# Patient Record
Sex: Female | Born: 1960 | Race: White | Hispanic: No | Marital: Married | Smoking: Current every day smoker
Health system: Southern US, Community
[De-identification: ages and names within clinical notes are randomized; demographics above are authoritative.]

## PROBLEM LIST (undated history)

## (undated) ENCOUNTER — Emergency Department (HOSPITAL_COMMUNITY): Admission: EM | Payer: Medicare Other | Source: Home / Self Care

## (undated) DIAGNOSIS — K449 Diaphragmatic hernia without obstruction or gangrene: Secondary | ICD-10-CM

## (undated) DIAGNOSIS — M5430 Sciatica, unspecified side: Secondary | ICD-10-CM

## (undated) DIAGNOSIS — M1990 Unspecified osteoarthritis, unspecified site: Secondary | ICD-10-CM

## (undated) DIAGNOSIS — K589 Irritable bowel syndrome without diarrhea: Secondary | ICD-10-CM

## (undated) DIAGNOSIS — Q282 Arteriovenous malformation of cerebral vessels: Secondary | ICD-10-CM

## (undated) DIAGNOSIS — G43909 Migraine, unspecified, not intractable, without status migrainosus: Secondary | ICD-10-CM

## (undated) DIAGNOSIS — Z9889 Other specified postprocedural states: Secondary | ICD-10-CM

## (undated) DIAGNOSIS — I10 Essential (primary) hypertension: Secondary | ICD-10-CM

## (undated) DIAGNOSIS — S0430XA Injury of trigeminal nerve, unspecified side, initial encounter: Secondary | ICD-10-CM

## (undated) DIAGNOSIS — F419 Anxiety disorder, unspecified: Secondary | ICD-10-CM

## (undated) DIAGNOSIS — M797 Fibromyalgia: Secondary | ICD-10-CM

## (undated) DIAGNOSIS — E785 Hyperlipidemia, unspecified: Secondary | ICD-10-CM

## (undated) DIAGNOSIS — K219 Gastro-esophageal reflux disease without esophagitis: Secondary | ICD-10-CM

## (undated) DIAGNOSIS — D3500 Benign neoplasm of unspecified adrenal gland: Secondary | ICD-10-CM

## (undated) HISTORY — PX: LUNG SURGERY: SHX703

## (undated) HISTORY — PX: APPENDECTOMY: SHX54

## (undated) HISTORY — PX: CEREBRAL ANEURYSM REPAIR: SHX164

## (undated) HISTORY — PX: ABDOMINAL HYSTERECTOMY: SHX81

## (undated) HISTORY — PX: BRAIN SURGERY: SHX531

## (undated) HISTORY — PX: OTHER SURGICAL HISTORY: SHX169

## (undated) HISTORY — PX: CERVICAL FUSION: SHX112

## (undated) HISTORY — PX: ABDOMINAL SURGERY: SHX537

## (undated) HISTORY — PX: CHOLECYSTECTOMY: SHX55

---

## 2007-12-09 ENCOUNTER — Emergency Department (HOSPITAL_COMMUNITY): Admission: EM | Admit: 2007-12-09 | Discharge: 2007-12-09 | Payer: Self-pay | Admitting: Emergency Medicine

## 2009-11-01 DIAGNOSIS — K219 Gastro-esophageal reflux disease without esophagitis: Secondary | ICD-10-CM | POA: Insufficient documentation

## 2009-12-06 HISTORY — PX: LAPAROSCOPIC NISSEN FUNDOPLICATION: SHX1932

## 2009-12-06 NOTE — Procedures (Signed)
"   Brief Operative Note  12/06/2009   11:56 AM  Pre-Op Diagnosis: GERD  Post-Op Diagnosis: GERD  Procedures:  1. Laparoscopic Nissen fundoplication  Primary Surgeon: Alm Molt, MD  Fellow Surgeon: Camie Maiden, MD  Assistants: Lynwood Grade, MD  Findings: 1. Normal EGD   Anesthesia: General  IV Fluid: 1200cc  EBL: 50cc  UOP: 100cc  Specimens: None  Drains: None  Complications: None  Disposition: [facility]PO ICU  Condition: Stable  Lynwood Grade, M.D.   "

## 2010-05-24 ENCOUNTER — Encounter (HOSPITAL_COMMUNITY): Payer: Self-pay | Admitting: Radiology

## 2010-05-24 ENCOUNTER — Emergency Department (HOSPITAL_COMMUNITY)
Admission: EM | Admit: 2010-05-24 | Discharge: 2010-05-24 | Disposition: A | Discharge status: Home / Self Care | Payer: Medicare Other | Attending: Emergency Medicine | Admitting: Emergency Medicine

## 2010-05-24 ENCOUNTER — Emergency Department (HOSPITAL_COMMUNITY): Payer: Medicare Other

## 2010-05-24 DIAGNOSIS — K589 Irritable bowel syndrome without diarrhea: Secondary | ICD-10-CM | POA: Insufficient documentation

## 2010-05-24 DIAGNOSIS — R197 Diarrhea, unspecified: Secondary | ICD-10-CM | POA: Insufficient documentation

## 2010-05-24 DIAGNOSIS — R109 Unspecified abdominal pain: Secondary | ICD-10-CM | POA: Insufficient documentation

## 2010-05-24 DIAGNOSIS — Z79899 Other long term (current) drug therapy: Secondary | ICD-10-CM | POA: Insufficient documentation

## 2010-05-24 DIAGNOSIS — I10 Essential (primary) hypertension: Secondary | ICD-10-CM | POA: Insufficient documentation

## 2010-05-24 DIAGNOSIS — Z888 Allergy status to other drugs, medicaments and biological substances status: Secondary | ICD-10-CM | POA: Insufficient documentation

## 2010-05-24 DIAGNOSIS — IMO0001 Reserved for inherently not codable concepts without codable children: Secondary | ICD-10-CM | POA: Insufficient documentation

## 2010-05-24 DIAGNOSIS — K449 Diaphragmatic hernia without obstruction or gangrene: Secondary | ICD-10-CM | POA: Insufficient documentation

## 2010-05-24 DIAGNOSIS — R112 Nausea with vomiting, unspecified: Secondary | ICD-10-CM | POA: Insufficient documentation

## 2010-05-24 DIAGNOSIS — F3289 Other specified depressive episodes: Secondary | ICD-10-CM | POA: Insufficient documentation

## 2010-05-24 DIAGNOSIS — F329 Major depressive disorder, single episode, unspecified: Secondary | ICD-10-CM | POA: Insufficient documentation

## 2010-05-24 DIAGNOSIS — M5430 Sciatica, unspecified side: Secondary | ICD-10-CM | POA: Insufficient documentation

## 2010-05-24 DIAGNOSIS — E7800 Pure hypercholesterolemia, unspecified: Secondary | ICD-10-CM | POA: Insufficient documentation

## 2010-05-24 HISTORY — DX: Essential (primary) hypertension: I10

## 2010-05-24 LAB — URINALYSIS, ROUTINE W REFLEX MICROSCOPIC
Bilirubin Urine: NEGATIVE
Glucose, UA: NEGATIVE mg/dL
Hgb urine dipstick: NEGATIVE
Ketones, ur: NEGATIVE mg/dL
Nitrite: NEGATIVE
Protein, ur: NEGATIVE mg/dL
Specific Gravity, Urine: 1.02 (ref 1.005–1.030)
Urobilinogen, UA: 0.2 mg/dL (ref 0.0–1.0)
pH: 6 (ref 5.0–8.0)

## 2010-05-24 LAB — DIFFERENTIAL
Basophils Absolute: 0.1 10*3/uL (ref 0.0–0.1)
Basophils Relative: 1 % (ref 0–1)
Eosinophils Absolute: 0.1 10*3/uL (ref 0.0–0.7)
Eosinophils Relative: 1 % (ref 0–5)
Lymphocytes Relative: 40 % (ref 12–46)
Lymphs Abs: 4.1 10*3/uL — ABNORMAL HIGH (ref 0.7–4.0)
Monocytes Absolute: 0.5 10*3/uL (ref 0.1–1.0)
Monocytes Relative: 5 % (ref 3–12)
Neutro Abs: 5.4 10*3/uL (ref 1.7–7.7)
Neutrophils Relative %: 53 % (ref 43–77)

## 2010-05-24 LAB — COMPREHENSIVE METABOLIC PANEL
ALT: 30 U/L (ref 0–35)
AST: 19 U/L (ref 0–37)
Albumin: 3.5 g/dL (ref 3.5–5.2)
Alkaline Phosphatase: 48 U/L (ref 39–117)
BUN: 12 mg/dL (ref 6–23)
CO2: 27 mEq/L (ref 19–32)
Calcium: 9 mg/dL (ref 8.4–10.5)
Chloride: 105 mEq/L (ref 96–112)
Creatinine, Ser: 0.79 mg/dL (ref 0.4–1.2)
GFR calc Af Amer: >60 mL/min (ref >60)
GFR calc non Af Amer: >60 mL/min (ref >60)
Glucose, Bld: 92 mg/dL (ref 70–99)
Potassium: 3.2 mEq/L — ABNORMAL LOW (ref 3.5–5.1)
Sodium: 138 mEq/L (ref 135–145)
Total Bilirubin: 0.6 mg/dL (ref 0.3–1.2)
Total Protein: 6.7 g/dL (ref 6.0–8.3)

## 2010-05-24 LAB — CBC
HCT: 39.9 % (ref 36.0–46.0)
Hemoglobin: 12.9 g/dL (ref 12.0–15.0)
MCH: 30.1 pg (ref 26.0–34.0)
MCHC: 32.3 g/dL (ref 30.0–36.0)
MCV: 93.2 fL (ref 78.0–100.0)
Platelets: 244 10*3/uL (ref 150–400)
RBC: 4.28 MIL/uL (ref 3.87–5.11)
RDW: 13.4 % (ref 11.5–15.5)
WBC: 10.3 10*3/uL (ref 4.0–10.5)

## 2010-05-24 LAB — LIPASE, BLOOD: Lipase: 20 U/L (ref 11–59)

## 2010-05-24 MED ORDER — IOHEXOL 300 MG/ML  SOLN
100.0000 mL | Freq: Once | INTRAMUSCULAR | Status: AC | PRN
  Administered 2010-05-24: 100 mL via INTRAVENOUS

## 2010-07-16 ENCOUNTER — Emergency Department (HOSPITAL_COMMUNITY)
Admission: EM | Admit: 2010-07-16 | Discharge: 2010-07-16 | Disposition: A | Discharge status: Home / Self Care | Payer: Medicare Other | Attending: Emergency Medicine | Admitting: Emergency Medicine

## 2010-07-16 DIAGNOSIS — R079 Chest pain, unspecified: Secondary | ICD-10-CM | POA: Insufficient documentation

## 2010-07-16 DIAGNOSIS — F3289 Other specified depressive episodes: Secondary | ICD-10-CM | POA: Insufficient documentation

## 2010-07-16 DIAGNOSIS — E7800 Pure hypercholesterolemia, unspecified: Secondary | ICD-10-CM | POA: Insufficient documentation

## 2010-07-16 DIAGNOSIS — I10 Essential (primary) hypertension: Secondary | ICD-10-CM | POA: Insufficient documentation

## 2010-07-16 DIAGNOSIS — G8929 Other chronic pain: Secondary | ICD-10-CM | POA: Insufficient documentation

## 2010-07-16 DIAGNOSIS — F329 Major depressive disorder, single episode, unspecified: Secondary | ICD-10-CM | POA: Insufficient documentation

## 2010-07-16 DIAGNOSIS — R110 Nausea: Secondary | ICD-10-CM | POA: Insufficient documentation

## 2010-07-16 LAB — CK: Total CK: 25 U/L (ref 7–177)

## 2010-08-01 ENCOUNTER — Emergency Department (HOSPITAL_COMMUNITY): Payer: Medicare Other

## 2010-08-01 ENCOUNTER — Emergency Department (HOSPITAL_COMMUNITY)
Admission: EM | Admit: 2010-08-01 | Discharge: 2010-08-01 | Disposition: A | Discharge status: Home / Self Care | Payer: Medicare Other | Attending: Emergency Medicine | Admitting: Emergency Medicine

## 2010-08-01 DIAGNOSIS — K449 Diaphragmatic hernia without obstruction or gangrene: Secondary | ICD-10-CM | POA: Insufficient documentation

## 2010-08-01 DIAGNOSIS — M549 Dorsalgia, unspecified: Secondary | ICD-10-CM | POA: Insufficient documentation

## 2010-08-01 DIAGNOSIS — R079 Chest pain, unspecified: Secondary | ICD-10-CM | POA: Insufficient documentation

## 2010-08-01 DIAGNOSIS — IMO0001 Reserved for inherently not codable concepts without codable children: Secondary | ICD-10-CM | POA: Insufficient documentation

## 2010-08-01 DIAGNOSIS — F329 Major depressive disorder, single episode, unspecified: Secondary | ICD-10-CM | POA: Insufficient documentation

## 2010-08-01 DIAGNOSIS — E876 Hypokalemia: Secondary | ICD-10-CM | POA: Insufficient documentation

## 2010-08-01 DIAGNOSIS — K589 Irritable bowel syndrome without diarrhea: Secondary | ICD-10-CM | POA: Insufficient documentation

## 2010-08-01 DIAGNOSIS — F3289 Other specified depressive episodes: Secondary | ICD-10-CM | POA: Insufficient documentation

## 2010-08-01 DIAGNOSIS — E7800 Pure hypercholesterolemia, unspecified: Secondary | ICD-10-CM | POA: Insufficient documentation

## 2010-08-01 DIAGNOSIS — R109 Unspecified abdominal pain: Secondary | ICD-10-CM | POA: Insufficient documentation

## 2010-08-01 DIAGNOSIS — R52 Pain, unspecified: Secondary | ICD-10-CM | POA: Insufficient documentation

## 2010-08-01 DIAGNOSIS — I10 Essential (primary) hypertension: Secondary | ICD-10-CM | POA: Insufficient documentation

## 2010-08-01 DIAGNOSIS — Z79899 Other long term (current) drug therapy: Secondary | ICD-10-CM | POA: Insufficient documentation

## 2010-08-01 LAB — URINALYSIS, ROUTINE W REFLEX MICROSCOPIC
Bilirubin Urine: NEGATIVE
Glucose, UA: NEGATIVE mg/dL
Hgb urine dipstick: NEGATIVE
Ketones, ur: NEGATIVE mg/dL
Leukocytes, UA: NEGATIVE
Nitrite: NEGATIVE
Protein, ur: NEGATIVE mg/dL
Specific Gravity, Urine: 1.015 (ref 1.005–1.030)
Urobilinogen, UA: 0.2 mg/dL (ref 0.0–1.0)
pH: 5.5 (ref 5.0–8.0)

## 2010-08-01 LAB — DIFFERENTIAL
Basophils Absolute: 0.1 10*3/uL (ref 0.0–0.1)
Basophils Relative: 1 % (ref 0–1)
Eosinophils Absolute: 0.2 10*3/uL (ref 0.0–0.7)
Eosinophils Relative: 2 % (ref 0–5)
Lymphocytes Relative: 38 % (ref 12–46)
Lymphs Abs: 3.3 10*3/uL (ref 0.7–4.0)
Monocytes Absolute: 0.6 10*3/uL (ref 0.1–1.0)
Monocytes Relative: 7 % (ref 3–12)
Neutro Abs: 4.5 10*3/uL (ref 1.7–7.7)
Neutrophils Relative %: 53 % (ref 43–77)

## 2010-08-01 LAB — BASIC METABOLIC PANEL
BUN: 14 mg/dL (ref 6–23)
CO2: 23 mEq/L (ref 19–32)
Calcium: 10.3 mg/dL (ref 8.4–10.5)
Chloride: 104 mEq/L (ref 96–112)
Creatinine, Ser: 0.85 mg/dL (ref 0.4–1.2)
GFR calc Af Amer: >60 mL/min (ref >60)
GFR calc non Af Amer: >60 mL/min (ref >60)
Glucose, Bld: 101 mg/dL — ABNORMAL HIGH (ref 70–99)
Potassium: 2.8 mEq/L — ABNORMAL LOW (ref 3.5–5.1)
Sodium: 139 mEq/L (ref 135–145)

## 2010-08-01 LAB — CBC
HCT: 44.4 % (ref 36.0–46.0)
Hemoglobin: 15 g/dL (ref 12.0–15.0)
MCH: 30.5 pg (ref 26.0–34.0)
MCHC: 33.8 g/dL (ref 30.0–36.0)
MCV: 90.2 fL (ref 78.0–100.0)
Platelets: 298 10*3/uL (ref 150–400)
RBC: 4.92 MIL/uL (ref 3.87–5.11)
RDW: 13.4 % (ref 11.5–15.5)
WBC: 8.6 10*3/uL (ref 4.0–10.5)

## 2010-08-01 LAB — CK TOTAL AND CKMB (NOT AT ARMC)
CK, MB: 1.5 ng/mL (ref 0.3–4.0)
Relative Index: INVALID (ref 0.0–2.5)
Total CK: 40 U/L (ref 7–177)

## 2010-08-01 LAB — LIPASE, BLOOD: Lipase: 18 U/L (ref 11–59)

## 2010-08-01 LAB — TROPONIN I: Troponin I: <0.3 ng/mL (ref <0.30)

## 2010-08-14 ENCOUNTER — Emergency Department (HOSPITAL_COMMUNITY)
Admission: EM | Admit: 2010-08-14 | Discharge: 2010-08-14 | Disposition: A | Discharge status: Home / Self Care | Payer: Medicare Other | Attending: Emergency Medicine | Admitting: Emergency Medicine

## 2010-08-14 DIAGNOSIS — Z79899 Other long term (current) drug therapy: Secondary | ICD-10-CM | POA: Insufficient documentation

## 2010-08-14 DIAGNOSIS — IMO0001 Reserved for inherently not codable concepts without codable children: Secondary | ICD-10-CM | POA: Insufficient documentation

## 2010-08-14 DIAGNOSIS — F329 Major depressive disorder, single episode, unspecified: Secondary | ICD-10-CM | POA: Insufficient documentation

## 2010-08-14 DIAGNOSIS — E7800 Pure hypercholesterolemia, unspecified: Secondary | ICD-10-CM | POA: Insufficient documentation

## 2010-08-14 DIAGNOSIS — M545 Low back pain, unspecified: Secondary | ICD-10-CM | POA: Insufficient documentation

## 2010-08-14 DIAGNOSIS — G8929 Other chronic pain: Secondary | ICD-10-CM | POA: Insufficient documentation

## 2010-08-14 DIAGNOSIS — I10 Essential (primary) hypertension: Secondary | ICD-10-CM | POA: Insufficient documentation

## 2010-08-14 DIAGNOSIS — F3289 Other specified depressive episodes: Secondary | ICD-10-CM | POA: Insufficient documentation

## 2010-08-14 DIAGNOSIS — K589 Irritable bowel syndrome without diarrhea: Secondary | ICD-10-CM | POA: Insufficient documentation

## 2010-11-20 LAB — URINALYSIS, ROUTINE W REFLEX MICROSCOPIC
Bilirubin Urine: NEGATIVE
Glucose, UA: NEGATIVE
Hgb urine dipstick: NEGATIVE
Nitrite: NEGATIVE
Protein, ur: NEGATIVE
Specific Gravity, Urine: >1.03 — ABNORMAL HIGH
Urobilinogen, UA: 0.2
pH: 5.5

## 2010-11-20 LAB — LIPASE, BLOOD: Lipase: 15

## 2010-11-20 LAB — COMPREHENSIVE METABOLIC PANEL
ALT: 16
AST: 18
Albumin: 4.2
Alkaline Phosphatase: 45
BUN: 10
CO2: 26
Calcium: 9.6
Chloride: 105
Creatinine, Ser: 0.74
GFR calc Af Amer: >60
GFR calc non Af Amer: >60
Glucose, Bld: 96
Potassium: 4.4
Sodium: 139
Total Bilirubin: 0.5
Total Protein: 7.2

## 2010-11-20 LAB — DIFFERENTIAL
Basophils Absolute: 0.1
Basophils Relative: 1
Eosinophils Absolute: 0
Eosinophils Relative: 0
Lymphocytes Relative: 37
Lymphs Abs: 3.6
Monocytes Absolute: 0.6
Monocytes Relative: 6
Neutro Abs: 5.4
Neutrophils Relative %: 56

## 2010-11-20 LAB — CBC
HCT: 42.8
Hemoglobin: 14.6
MCHC: 34.2
MCV: 90.2
Platelets: 293
RBC: 4.74
RDW: 13.6
WBC: 9.6

## 2010-11-20 LAB — URINE CULTURE
Colony Count: NO GROWTH
Culture: NO GROWTH

## 2011-07-05 ENCOUNTER — Encounter (HOSPITAL_COMMUNITY): Payer: Self-pay | Admitting: *Deleted

## 2011-07-05 ENCOUNTER — Emergency Department (HOSPITAL_COMMUNITY)
Admission: EM | Admit: 2011-07-05 | Discharge: 2011-07-05 | Disposition: A | Discharge status: Home / Self Care | Payer: Medicare Other | Attending: Emergency Medicine | Admitting: Emergency Medicine

## 2011-07-05 ENCOUNTER — Emergency Department (HOSPITAL_COMMUNITY): Payer: Medicare Other

## 2011-07-05 DIAGNOSIS — M129 Arthropathy, unspecified: Secondary | ICD-10-CM | POA: Insufficient documentation

## 2011-07-05 DIAGNOSIS — E876 Hypokalemia: Secondary | ICD-10-CM | POA: Insufficient documentation

## 2011-07-05 DIAGNOSIS — R0602 Shortness of breath: Secondary | ICD-10-CM | POA: Insufficient documentation

## 2011-07-05 DIAGNOSIS — R197 Diarrhea, unspecified: Secondary | ICD-10-CM | POA: Insufficient documentation

## 2011-07-05 DIAGNOSIS — R209 Unspecified disturbances of skin sensation: Secondary | ICD-10-CM | POA: Insufficient documentation

## 2011-07-05 DIAGNOSIS — R1310 Dysphagia, unspecified: Secondary | ICD-10-CM | POA: Insufficient documentation

## 2011-07-05 DIAGNOSIS — I10 Essential (primary) hypertension: Secondary | ICD-10-CM | POA: Insufficient documentation

## 2011-07-05 DIAGNOSIS — R109 Unspecified abdominal pain: Secondary | ICD-10-CM | POA: Insufficient documentation

## 2011-07-05 DIAGNOSIS — E785 Hyperlipidemia, unspecified: Secondary | ICD-10-CM | POA: Insufficient documentation

## 2011-07-05 DIAGNOSIS — K589 Irritable bowel syndrome without diarrhea: Secondary | ICD-10-CM | POA: Insufficient documentation

## 2011-07-05 DIAGNOSIS — R112 Nausea with vomiting, unspecified: Secondary | ICD-10-CM

## 2011-07-05 DIAGNOSIS — K219 Gastro-esophageal reflux disease without esophagitis: Secondary | ICD-10-CM | POA: Insufficient documentation

## 2011-07-05 DIAGNOSIS — L7682 Other postprocedural complications of skin and subcutaneous tissue: Secondary | ICD-10-CM

## 2011-07-05 DIAGNOSIS — R071 Chest pain on breathing: Secondary | ICD-10-CM | POA: Insufficient documentation

## 2011-07-05 DIAGNOSIS — Z79899 Other long term (current) drug therapy: Secondary | ICD-10-CM | POA: Insufficient documentation

## 2011-07-05 HISTORY — DX: Gastro-esophageal reflux disease without esophagitis: K21.9

## 2011-07-05 HISTORY — DX: Sciatica, unspecified side: M54.30

## 2011-07-05 HISTORY — DX: Unspecified osteoarthritis, unspecified site: M19.90

## 2011-07-05 HISTORY — DX: Diaphragmatic hernia without obstruction or gangrene: K44.9

## 2011-07-05 HISTORY — DX: Fibromyalgia: M79.7

## 2011-07-05 HISTORY — DX: Hyperlipidemia, unspecified: E78.5

## 2011-07-05 HISTORY — DX: Anxiety disorder, unspecified: F41.9

## 2011-07-05 HISTORY — DX: Irritable bowel syndrome, unspecified: K58.9

## 2011-07-05 HISTORY — DX: Migraine, unspecified, not intractable, without status migrainosus: G43.909

## 2011-07-05 HISTORY — DX: Other specified postprocedural states: Z98.890

## 2011-07-05 LAB — COMPREHENSIVE METABOLIC PANEL
ALT: 19 U/L (ref 0–35)
AST: 19 U/L (ref 0–37)
Albumin: 4.1 g/dL (ref 3.5–5.2)
Alkaline Phosphatase: 60 U/L (ref 39–117)
BUN: 12 mg/dL (ref 6–23)
CO2: 21 mEq/L (ref 19–32)
Calcium: 10.1 mg/dL (ref 8.4–10.5)
Chloride: 104 mEq/L (ref 96–112)
Creatinine, Ser: 1.08 mg/dL (ref 0.50–1.10)
GFR calc Af Amer: 68 mL/min — ABNORMAL LOW (ref >90)
GFR calc non Af Amer: 58 mL/min — ABNORMAL LOW (ref >90)
Glucose, Bld: 116 mg/dL — ABNORMAL HIGH (ref 70–99)
Potassium: 2.8 mEq/L — ABNORMAL LOW (ref 3.5–5.1)
Sodium: 140 mEq/L (ref 135–145)
Total Bilirubin: 0.3 mg/dL (ref 0.3–1.2)
Total Protein: 7.8 g/dL (ref 6.0–8.3)

## 2011-07-05 LAB — URINALYSIS, ROUTINE W REFLEX MICROSCOPIC
Bilirubin Urine: NEGATIVE
Glucose, UA: NEGATIVE mg/dL
Hgb urine dipstick: NEGATIVE
Ketones, ur: NEGATIVE mg/dL
Leukocytes, UA: NEGATIVE
Nitrite: NEGATIVE
Protein, ur: NEGATIVE mg/dL
Specific Gravity, Urine: 1.025 (ref 1.005–1.030)
Urobilinogen, UA: 0.2 mg/dL (ref 0.0–1.0)
pH: 5.5 (ref 5.0–8.0)

## 2011-07-05 LAB — CBC
HCT: 37.5 % (ref 36.0–46.0)
Hemoglobin: 12.8 g/dL (ref 12.0–15.0)
MCH: 29.4 pg (ref 26.0–34.0)
MCHC: 34.1 g/dL (ref 30.0–36.0)
MCV: 86.2 fL (ref 78.0–100.0)
Platelets: 265 10*3/uL (ref 150–400)
RBC: 4.35 MIL/uL (ref 3.87–5.11)
RDW: 13.4 % (ref 11.5–15.5)
WBC: 9.8 10*3/uL (ref 4.0–10.5)

## 2011-07-05 LAB — LIPASE, BLOOD: Lipase: 14 U/L (ref 11–59)

## 2011-07-05 LAB — DIFFERENTIAL
Basophils Absolute: 0 10*3/uL (ref 0.0–0.1)
Basophils Relative: 0 % (ref 0–1)
Eosinophils Absolute: 0.2 10*3/uL (ref 0.0–0.7)
Eosinophils Relative: 2 % (ref 0–5)
Lymphocytes Relative: 25 % (ref 12–46)
Lymphs Abs: 2.5 10*3/uL (ref 0.7–4.0)
Monocytes Absolute: 0.4 10*3/uL (ref 0.1–1.0)
Monocytes Relative: 5 % (ref 3–12)
Neutro Abs: 6.7 10*3/uL (ref 1.7–7.7)
Neutrophils Relative %: 68 % (ref 43–77)

## 2011-07-05 MED ORDER — HYDROCODONE-ACETAMINOPHEN 5-325 MG PO TABS: ORAL_TABLET | ORAL | Status: AC

## 2011-07-05 MED ORDER — SODIUM CHLORIDE 0.9 % IV SOLN
INTRAVENOUS | Status: DC
  Administered 2011-07-05: 17:00:00 via INTRAVENOUS

## 2011-07-05 MED ORDER — POTASSIUM CHLORIDE 20 MEQ/15ML (10%) PO LIQD
40.0000 meq | Freq: Once | ORAL | Status: AC
  Administered 2011-07-05: 40 meq via ORAL
  Filled 2011-07-05: qty 30

## 2011-07-05 MED ORDER — IOHEXOL 300 MG/ML  SOLN
40.0000 mL | Freq: Once | INTRAMUSCULAR | Status: AC | PRN
  Administered 2011-07-05: 40 mL via ORAL

## 2011-07-05 MED ORDER — ONDANSETRON HCL 4 MG PO TABS: 4.0000 mg | ORAL_TABLET | Freq: Three times a day (TID) | ORAL | Status: AC | PRN

## 2011-07-05 MED ORDER — POTASSIUM CHLORIDE ER 10 MEQ PO TBCR: 10.0000 meq | EXTENDED_RELEASE_TABLET | Freq: Two times a day (BID) | ORAL | Status: DC

## 2011-07-05 MED ORDER — IOHEXOL 300 MG/ML  SOLN
100.0000 mL | Freq: Once | INTRAMUSCULAR | Status: AC | PRN
  Administered 2011-07-05: 100 mL via INTRAVENOUS

## 2011-07-05 MED ORDER — ONDANSETRON HCL 4 MG/2ML IJ SOLN
4.0000 mg | INTRAMUSCULAR | Status: DC | PRN
  Administered 2011-07-05: 4 mg via INTRAVENOUS
  Filled 2011-07-05: qty 2

## 2011-07-05 MED ORDER — MORPHINE SULFATE 4 MG/ML IJ SOLN
4.0000 mg | INTRAMUSCULAR | Status: AC | PRN
  Administered 2011-07-05 (×2): 4 mg via INTRAVENOUS
  Filled 2011-07-05 (×2): qty 1

## 2011-07-05 MED ORDER — POTASSIUM CHLORIDE 10 MEQ/100ML IV SOLN
10.0000 meq | Freq: Once | INTRAVENOUS | Status: AC
  Administered 2011-07-05: 10 meq via INTRAVENOUS
  Filled 2011-07-05: qty 100

## 2011-07-05 NOTE — ED Notes (Signed)
Pt states she had a lung mass removed April 3rd from right lung. Incisional pain to breast area. Pt also states feels like food gets stuck in throat and epigastric area. Pt states SOB with activity. ALso states vomiting and diarrhea began Saturday.

## 2011-07-05 NOTE — ED Provider Notes (Addendum)
History     CSN: [id number]  Arrival date & time 07/05/11  1600   First MD Initiated Contact with Patient 07/05/11 1636      Chief Complaint  Patient presents with  . Incisional Pain  . Dysphagia  . Emesis  . Diarrhea     HPI Pt was seen at 1650.  Per pt, c/o gradual onset and persistence of constant incisional pain in the right side of her chest wall for over the past 1 month.  This has been associated with multiple daily intermittent episodes of N/V/D.  States her symptoms have been constant since 05/22/11 when she "had a mass removed from my right lung."  Pt also c/o gradual onset and persistence of constant dysphagia and SOB for the past 1 year.  Denies any change in any of her symptoms.  Denies CP/palpitations, no cough, no fevers, no black or blood in stools or emesis, no rash, no abd pain.    PMD:  Dr. Harland Dingwall Past Medical History  Diagnosis Date  . Hypertension   . GERD (gastroesophageal reflux disease)   . Sciatica   . Arthritis   . Migraines   . Anxiety   . Hyperlipidemia   . Hiatal hernia   . Fibromyalgia   . IBS (irritable bowel syndrome)     Past Surgical History  Procedure Date  . Stent to esophagus   . 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 Appendectomy   . Abdominal hysterectomy   . Cholecystectomy   . Cervical fusion      History  Substance Use Topics  . Smoking status: Former Games developer  . Smokeless tobacco: Not on file  . Alcohol Use: No    Review of Systems ROS: Statement: All systems negative except as marked or noted in the HPI; Constitutional: Negative for fever and chills. ; ; Eyes: Negative for eye pain, redness and discharge. ; ; ENMT: Negative for ear pain, hoarseness, nasal congestion, sinus pressure and sore throat. ; ; Cardiovascular: Negative for chest pain, palpitations, diaphoresis, and peripheral edema. ; ; Respiratory: +SOB. Negative for cough, wheezing and stridor. ; ; Gastrointestinal: +dysphagia, N/V/D.  Negative for abdominal pain, blood in stool, hematemesis, jaundice and rectal bleeding. . ; ; Genitourinary: Negative for dysuria, flank pain and hematuria. ; ; Musculoskeletal: Negative for back pain and neck pain. Negative for swelling and trauma.; ; Skin: Negative for pruritus, rash, abrasions, blisters, bruising and skin lesion.; ; Neuro: Negative for headache, lightheadedness and neck stiffness. Negative for weakness, altered level of consciousness , altered mental status, extremity weakness, paresthesias, involuntary movement, seizure and syncope.      Allergies  Review of patient's allergies indicates no known allergies.  Home Medications   Current Outpatient Rx  Name Route Sig Dispense Refill  . ACETAMINOPHEN 325 MG PO TABS Oral Take 650 mg by mouth every 6 (six) hours as needed. For pain alternating with Ibuprofen    . BUDESONIDE-FORMOTEROL FUMARATE 160-4.5 MCG/ACT IN AERO Inhalation Inhale 2 puffs into the lungs 2 (two) times daily.    Marland Kitchen CLONIDINE HCL 0.3 MG PO TABS Oral Take 0.3 mg by mouth every morning.    Marland Kitchen DIPHENHYDRAMINE-APAP (SLEEP) 25-500 MG PO TABS Oral Take 2 tablets by mouth at bedtime as needed. For sleep/pain    . FLUOXETINE HCL 20 MG PO CAPS Oral Take 20 mg by mouth daily. Take one tablet in addition to 40mg  capsule for a total of 60mg  daily    .  FLUOXETINE HCL 40 MG PO CAPS Oral Take 40 mg by mouth every morning. Take one capsule in addition to 20mg  tablet for a total of 60mg  daily    . IBUPROFEN 200 MG PO TABS Oral Take 800 mg by mouth every 4 (four) hours as needed. For pain in alternation with Tylenol    . IPRATROPIUM BROMIDE 0.02 % IN SOLN Nebulization Take 500 mcg by nebulization 3 (three) times daily as needed.    Marland Kitchen LISINOPRIL-HYDROCHLOROTHIAZIDE 20-12.5 MG PO TABS Oral Take 1 tablet by mouth daily.    Marland Kitchen PROMETHAZINE HCL 25 MG PO TABS Oral Take 25 mg by mouth every 6 (six) hours as needed. For nausea    . ROPINIROLE HCL 3 MG PO TABS Oral Take 3 mg by mouth at  bedtime. Taken 2 hours prior to bedtime    . SUMATRIPTAN SUCCINATE 6 MG/0.5ML [place] SOLN Subcutaneous Inject 6 mg into the skin every 2 (two) hours as needed. For migraine    . ZONISAMIDE 100 MG PO CAPS Oral Take 100 mg by mouth at bedtime.      BP 161/88  Pulse 81  Temp(Src) 98.2 F (36.8 C) (Oral)  Resp 16  Ht 5' (1.524 m)  Wt 160 lb (72.576 kg)  BMI 31.25 kg/m2  SpO2 100%  Physical Exam 1655: Physical examination:  Nursing notes reviewed; Vital signs and O2 SAT reviewed;  Constitutional: Well developed, Well nourished, Well hydrated, In no acute distress; Head:  Normocephalic, atraumatic; Eyes: EOMI, PERRL, No scleral icterus; ENMT: Mouth and pharynx normal, Mucous membranes moist; Neck: Supple, Full range of motion, No lymphadenopathy; Cardiovascular: Regular rate and rhythm, No murmur or gallop; Respiratory: Breath sounds clear & equal bilaterally, No rales, rhonchi, wheezes, speaking full sentences with ease, Normal respiratory effort/excursion; Chest: +TTP right lateral-posterior chest wall well healed surgical scars, no erythema, no ecchymosis, no drainage, no fluctuance, no soft tissue crepitus. No rash. Movement normal; Abdomen: Soft, Nontender, Nondistended, Normal bowel sounds; Genitourinary: No CVA tenderness; Extremities: Pulses normal, No tenderness, No edema, No calf edema or asymmetry.; Neuro: AA&Ox3, Major CN grossly intact. Gait steady, no facial droop. Speech clear. No gross focal motor or sensory deficits in extremities.; Skin: Color normal, Warm, Dry, no rash.    ED Course  Procedures   MDM  MDM Reviewed: previous chart, nursing note and vitals Interpretation: labs, x-ray and CT scan   Results for orders placed during the hospital encounter of 07/05/11  CBC      Component Value Range   WBC 9.8  4.0 - 10.5 (K/uL)   RBC 4.35  3.87 - 5.11 (MIL/uL)   Hemoglobin 12.8  12.0 - 15.0 (g/dL)   HCT 16.1  09.6 - 04.5 (%)   MCV 86.2  78.0 - 100.0 (fL)   MCH 29.4  26.0 -  34.0 (pg)   MCHC 34.1  30.0 - 36.0 (g/dL)   RDW 40.9  81.1 - 91.4 (%)   Platelets 265  150 - 400 (K/uL)  DIFFERENTIAL      Component Value Range   Neutrophils Relative 68  43 - 77 (%)   Neutro Abs 6.7  1.7 - 7.7 (K/uL)   Lymphocytes Relative 25  12 - 46 (%)   Lymphs Abs 2.5  0.7 - 4.0 (K/uL)   Monocytes Relative 5  3 - 12 (%)   Monocytes Absolute 0.4  0.1 - 1.0 (K/uL)   Eosinophils Relative 2  0 - 5 (%)   Eosinophils Absolute 0.2  0.0 - 0.7 (  K/uL)   Basophils Relative 0  0 - 1 (%)   Basophils Absolute 0.0  0.0 - 0.1 (K/uL)  COMPREHENSIVE METABOLIC PANEL      Component Value Range   Sodium 140  135 - 145 (mEq/L)   Potassium 2.8 (*) 3.5 - 5.1 (mEq/L)   Chloride 104  96 - 112 (mEq/L)   CO2 21  19 - 32 (mEq/L)   Glucose, Bld 116 (*) 70 - 99 (mg/dL)   BUN 12  6 - 23 (mg/dL)   Creatinine, Ser 1.61  0.50 - 1.10 (mg/dL)   Calcium 09.6  8.4 - 10.5 (mg/dL)   Total Protein 7.8  6.0 - 8.3 (g/dL)   Albumin 4.1  3.5 - 5.2 (g/dL)   AST 19  0 - 37 (U/L)   ALT 19  0 - 35 (U/L)   Alkaline Phosphatase 60  39 - 117 (U/L)   Total Bilirubin 0.3  0.3 - 1.2 (mg/dL)   GFR calc non Af Amer 58 (*) >90 (mL/min)   GFR calc Af Amer 68 (*) >90 (mL/min)  LIPASE, BLOOD      Component Value Range   Lipase 14  11 - 59 (U/L)  URINALYSIS, ROUTINE W REFLEX MICROSCOPIC      Component Value Range   Color, Urine AMBER (*) YELLOW    APPearance CLEAR  CLEAR    Specific Gravity, Urine 1.025  1.005 - 1.030    pH 5.5  5.0 - 8.0    Glucose, UA NEGATIVE  NEGATIVE (mg/dL)   Hgb urine dipstick NEGATIVE  NEGATIVE    Bilirubin Urine NEGATIVE  NEGATIVE    Ketones, ur NEGATIVE  NEGATIVE (mg/dL)   Protein, ur NEGATIVE  NEGATIVE (mg/dL)   Urobilinogen, UA 0.2  0.0 - 1.0 (mg/dL)   Nitrite NEGATIVE  NEGATIVE    Leukocytes, UA NEGATIVE  NEGATIVE    Ct Angio Chest W/cm &/or Wo Cm 07/05/2011  *RADIOLOGY REPORT*  Clinical Data:  Mid abdominal pain.  Recent surgery to remove growth in the lungs at [facility].   Shortness of breath.  CT ANGIOGRAPHY CHEST CT ABDOMEN AND PELVIS WITH CONTRAST  Technique:  Multidetector CT imaging of the chest was performed using the standard protocol during bolus administration of intravenous contrast.  Multiplanar CT image reconstructions including MIPs were obtained to evaluate the vascular anatomy. Multidetector CT imaging of the abdomen and pelvis was performed using the standard protocol during bolus administration of intravenous contrast.  Contrast: OMNIPAQUE IOHEXOL 300 MG/ML  SOLN  Comparison:  CT of abdomen and pelvis 05/24/2010.  CTA CHEST  Findings:  Mediastinum: There are no filling defects within the pulmonary arterial tree to suggest underlying pulmonary embolism.  Heart size is normal. There is no significant pericardial fluid, thickening or pericardial calcification. No pathologically enlarged mediastinal or hilar lymph nodes. The esophagus is unremarkable in appearance.  Lungs/Pleura: Minimal dependent atelectasis is noted in the lower lobes of the lungs bilaterally.  There is an area of scarring in the inferior segment of the lingula.  No definite suspicious appearing pulmonary nodules or masses are identified.  No consolidative airspace disease. Trace left-sided pleural effusion layering dependently.  Musculoskeletal: There are no aggressive appearing lytic or blastic lesions noted in the visualized portions of the skeleton. Orthopedic fixation hardware in the lower cervical spine incompletely visualized.   Review of the MIP images confirms the above findings.  IMPRESSION: 1.  No evidence of pulmonary embolism. 2.  Small amount of scarring in the  inferior segment of the lingula. 3.  Trace left-sided pleural effusion layering dependently.  CT ABDOMEN AND PELVIS  Findings:  Abdomen/Pelvis:  The patient is status post cholecystectomy. Postoperative changes of fundoplication are noted.  The enhanced appearance of the liver, pancreas, spleen, bilateral adrenal glands and  bilateral kidneys is unremarkable.  There is no ascites or pneumoperitoneum and no pathologic distension of bowel.  No definite pathologic adenopathy identified within the abdomen or pelvis.  The patient is status post hysterectomy.  The ovaries and urinary bladder are unremarkable in appearance.  Musculoskeletal: There are no aggressive appearing lytic or blastic lesions noted in the visualized portions of the skeleton.  Review of the MIP images confirms the above findings.  IMPRESSION: 1.  No acute findings in the abdomen or pelvis to account for the patient's symptoms. 2.  Status post cholecystectomy, Nissen fundoplication and hysterectomy.  Original Report Authenticated By: Florencia Reasons, M.D.   Ct Abdomen Pelvis W Contrast 07/05/2011  *RADIOLOGY REPORT*  Clinical Data:  Mid abdominal pain.  Recent surgery to remove growth in the lungs at [facility].  Shortness of breath.  CT ANGIOGRAPHY CHEST CT ABDOMEN AND PELVIS WITH CONTRAST  Technique:  Multidetector CT imaging of the chest was performed using the standard protocol during bolus administration of intravenous contrast.  Multiplanar CT image reconstructions including MIPs were obtained to evaluate the vascular anatomy. Multidetector CT imaging of the abdomen and pelvis was performed using the standard protocol during bolus administration of intravenous contrast.  Contrast: OMNIPAQUE IOHEXOL 300 MG/ML  SOLN  Comparison:  CT of abdomen and pelvis 05/24/2010.  CTA CHEST  Findings:  Mediastinum: There are no filling defects within the pulmonary arterial tree to suggest underlying pulmonary embolism.  Heart size is normal. There is no significant pericardial fluid, thickening or pericardial calcification. No pathologically enlarged mediastinal or hilar lymph nodes. The esophagus is unremarkable in appearance.  Lungs/Pleura: Minimal dependent atelectasis is noted in the lower lobes of the lungs bilaterally.  There is an area of scarring in the  inferior segment of the lingula.  No definite suspicious appearing pulmonary nodules or masses are identified.  No consolidative airspace disease. Trace left-sided pleural effusion layering dependently.  Musculoskeletal: There are no aggressive appearing lytic or blastic lesions noted in the visualized portions of the skeleton. Orthopedic fixation hardware in the lower cervical spine incompletely visualized.   Review of the MIP images confirms the above findings.  IMPRESSION: 1.  No evidence of pulmonary embolism. 2.  Small amount of scarring in the inferior segment of the lingula. 3.  Trace left-sided pleural effusion layering dependently.  CT ABDOMEN AND PELVIS  Findings:  Abdomen/Pelvis:  The patient is status post cholecystectomy. Postoperative changes of fundoplication are noted.  The enhanced appearance of the liver, pancreas, spleen, bilateral adrenal glands and bilateral kidneys is unremarkable.  There is no ascites or pneumoperitoneum and no pathologic distension of bowel.  No definite pathologic adenopathy identified within the abdomen or pelvis.  The patient is status post hysterectomy.  The ovaries and urinary bladder are unremarkable in appearance.  Musculoskeletal: There are no aggressive appearing lytic or blastic lesions noted in the visualized portions of the skeleton.  Review of the MIP images confirms the above findings.  IMPRESSION: 1.  No acute findings in the abdomen or pelvis to account for the patient's symptoms. 2.  Status post cholecystectomy, Nissen fundoplication and hysterectomy.  Original Report Authenticated By: Florencia Reasons, M.D.   Results for Silvestro,  Renee Gutierrez (MRN [id number]) as of 07/05/2011 21:19  Ref. Range 05/24/2010 15:32 08/01/2010 12:16 07/05/2011 17:22  Potassium Latest Range: 3.5-5.1 mEq/L 3.2 (L) 2.8 (L) 2.8 (L)      9:15 PM:  Pt states she came to the ED today because she "just couldn't take it anymore," and denies any change in her chronic symptoms today.  Pt  has been ambulatory in the ED with steady gait, easy resps, Sats 98% R/A.  Has tol PO well without gagging, nausea or vomiting.  Has not stooled while in the ED.  Previous labs with hypokalemia; potassium repleted PO and IV today.  Wants to go home now.  Dx testing d/w pt.  Questions answered.  Verb understanding, agreeable to d/c home with outpt f/u.  States her Heme/Onc doctor has already said he would see her in the office on Monday.              Laray Anger, DO 07/07/11 2336

## 2011-07-05 NOTE — Discharge Instructions (Signed)
RESOURCE GUIDE  Dental Problems  Patients with Medicaid: [facility]                     [facility] [address]                                           [address] Phone:  [phone]                                                  Phone:  [phone]  If unable to pay or uninsured, contact:  [facility] or [facility] to become qualified for the adult dental clinic.  Chronic Pain Problems Contact [facility]  [phone] Patients need to be referred by their primary care doctor.  Insufficient Money for Medicine Contact [employer]:  call "211" or [facility] 271-5999.  No Primary Care Doctor Call [facility]  832-8000 Other agencies that provide inexpensive medical care    [facility]  [phone]    [facility]  [phone]    [facility]  [phone]    Women's Clinic  [phone]    [facility]  [phone]    [facility]  [phone]  Psychological Services [facility]  [phone] [facility]  [phone] [facility]   [phone] (emergency services [phone])  Substance Abuse Resources [facility]  336-882-2125 [facility] 336-784-9470 [facility] 336-285-9073 [facility] 336-845-3988 Residential & Outpatient Substance Abuse Program  800-659-3381  Abuse/Neglect [place] Child Abuse Hotline (336) 641-3795 [place] Child Abuse Hotline 800-378-5315 (After Hours)  Emergency Shelter [facility] (336) 271-5985  Maternity Homes [facility] (336) 275-9566 [facility] (704) 372-4663  MRSA Hotline #:   832-7006    [place] [facility]  [facility] [place]     [employer]                          [facility] [address] [place]                       [address]      [address]  [place]                                                [place]                            [place] Phone:  [phone]                                   Phone:  [phone]                 Phone:  [phone]  [facility] Phone:  [phone]    [facility] Child Abuse Hotline 331 249 4625 929-516-1711 (After Hours)   Take the prescriptions as directed.  Increase your fluid intake (ie:  Gatoraide) for the next few days.  Eat a bland diet and advance to your regular diet slowly as you can tolerate it.   Avoid full strength juices, as well as milk and milk products until your diarrhea has resolved.   Call your regular medical doctor Monday to schedule a follow up appointment this week.  Return to the Emergency Department immediately if not improving (or even worsening) despite taking the medicines as prescribed, any black or bloody stool or vomit, if you develop a fever greater than "101," or for any other concerns.

## 2011-07-07 LAB — URINE CULTURE
Colony Count: >=100000
Culture  Setup Time: 201305180202

## 2011-07-08 NOTE — ED Notes (Addendum)
+   urine Chart sent to EDP office for review. 

## 2011-07-10 NOTE — ED Notes (Signed)
Likely contaminant -no rx indicated per Fayrene Helper

## 2011-08-08 DIAGNOSIS — M545 Low back pain, unspecified: Secondary | ICD-10-CM | POA: Insufficient documentation

## 2011-08-08 DIAGNOSIS — F4542 Pain disorder with related psychological factors: Secondary | ICD-10-CM | POA: Insufficient documentation

## 2011-08-08 DIAGNOSIS — F332 Major depressive disorder, recurrent severe without psychotic features: Secondary | ICD-10-CM | POA: Insufficient documentation

## 2011-08-08 DIAGNOSIS — G894 Chronic pain syndrome: Secondary | ICD-10-CM | POA: Insufficient documentation

## 2011-08-31 ENCOUNTER — Other Ambulatory Visit: Payer: Self-pay

## 2011-08-31 ENCOUNTER — Emergency Department (HOSPITAL_COMMUNITY)
Admission: EM | Admit: 2011-08-31 | Discharge: 2011-08-31 | Disposition: A | Discharge status: Home / Self Care | Payer: Medicare Other | Attending: Emergency Medicine | Admitting: Emergency Medicine

## 2011-08-31 ENCOUNTER — Encounter (HOSPITAL_COMMUNITY): Payer: Self-pay

## 2011-08-31 DIAGNOSIS — R110 Nausea: Secondary | ICD-10-CM | POA: Insufficient documentation

## 2011-08-31 DIAGNOSIS — I10 Essential (primary) hypertension: Secondary | ICD-10-CM | POA: Insufficient documentation

## 2011-08-31 DIAGNOSIS — Z79899 Other long term (current) drug therapy: Secondary | ICD-10-CM | POA: Insufficient documentation

## 2011-08-31 DIAGNOSIS — E785 Hyperlipidemia, unspecified: Secondary | ICD-10-CM | POA: Insufficient documentation

## 2011-08-31 DIAGNOSIS — M129 Arthropathy, unspecified: Secondary | ICD-10-CM | POA: Insufficient documentation

## 2011-08-31 DIAGNOSIS — K219 Gastro-esophageal reflux disease without esophagitis: Secondary | ICD-10-CM | POA: Insufficient documentation

## 2011-08-31 DIAGNOSIS — R51 Headache: Secondary | ICD-10-CM | POA: Insufficient documentation

## 2011-08-31 DIAGNOSIS — E876 Hypokalemia: Secondary | ICD-10-CM | POA: Insufficient documentation

## 2011-08-31 DIAGNOSIS — G8929 Other chronic pain: Secondary | ICD-10-CM | POA: Insufficient documentation

## 2011-08-31 DIAGNOSIS — E860 Dehydration: Secondary | ICD-10-CM | POA: Insufficient documentation

## 2011-08-31 LAB — CBC WITH DIFFERENTIAL/PLATELET
Basophils Absolute: 0 10*3/uL (ref 0.0–0.1)
Basophils Relative: 1 % (ref 0–1)
Eosinophils Absolute: 0.1 10*3/uL (ref 0.0–0.7)
Eosinophils Relative: 1 % (ref 0–5)
HCT: 40.4 % (ref 36.0–46.0)
Hemoglobin: 13.7 g/dL (ref 12.0–15.0)
Lymphocytes Relative: 37 % (ref 12–46)
Lymphs Abs: 3.3 10*3/uL (ref 0.7–4.0)
MCH: 29.8 pg (ref 26.0–34.0)
MCHC: 33.9 g/dL (ref 30.0–36.0)
MCV: 88 fL (ref 78.0–100.0)
Monocytes Absolute: 0.4 10*3/uL (ref 0.1–1.0)
Monocytes Relative: 4 % (ref 3–12)
Neutro Abs: 5 10*3/uL (ref 1.7–7.7)
Neutrophils Relative %: 57 % (ref 43–77)
Platelets: 261 10*3/uL (ref 150–400)
RBC: 4.59 MIL/uL (ref 3.87–5.11)
RDW: 14.8 % (ref 11.5–15.5)
WBC: 8.9 10*3/uL (ref 4.0–10.5)

## 2011-08-31 LAB — URINALYSIS, ROUTINE W REFLEX MICROSCOPIC
Bilirubin Urine: NEGATIVE
Glucose, UA: NEGATIVE mg/dL
Hgb urine dipstick: NEGATIVE
Leukocytes, UA: NEGATIVE
Nitrite: NEGATIVE
Protein, ur: NEGATIVE mg/dL
Specific Gravity, Urine: 1.025 (ref 1.005–1.030)
Urobilinogen, UA: 0.2 mg/dL (ref 0.0–1.0)
pH: 6 (ref 5.0–8.0)

## 2011-08-31 LAB — COMPREHENSIVE METABOLIC PANEL
ALT: 16 U/L (ref 0–35)
AST: 16 U/L (ref 0–37)
Albumin: 3.6 g/dL (ref 3.5–5.2)
Alkaline Phosphatase: 69 U/L (ref 39–117)
BUN: 10 mg/dL (ref 6–23)
CO2: 30 mEq/L (ref 19–32)
Calcium: 9.8 mg/dL (ref 8.4–10.5)
Chloride: 99 mEq/L (ref 96–112)
Creatinine, Ser: 0.79 mg/dL (ref 0.50–1.10)
GFR calc Af Amer: >90 mL/min (ref >90)
GFR calc non Af Amer: >90 mL/min (ref >90)
Glucose, Bld: 113 mg/dL — ABNORMAL HIGH (ref 70–99)
Potassium: 2.8 mEq/L — ABNORMAL LOW (ref 3.5–5.1)
Sodium: 138 mEq/L (ref 135–145)
Total Bilirubin: 0.2 mg/dL — ABNORMAL LOW (ref 0.3–1.2)
Total Protein: 7 g/dL (ref 6.0–8.3)

## 2011-08-31 LAB — RAPID URINE DRUG SCREEN, HOSP PERFORMED
Amphetamines: NOT DETECTED
Barbiturates: NOT DETECTED
Benzodiazepines: POSITIVE — AB
Cocaine: NOT DETECTED
Opiates: NOT DETECTED
Tetrahydrocannabinol: NOT DETECTED

## 2011-08-31 MED ORDER — DIPHENHYDRAMINE HCL 50 MG/ML IJ SOLN
25.0000 mg | Freq: Once | INTRAMUSCULAR | Status: AC
  Administered 2011-08-31: 25 mg via INTRAVENOUS
  Filled 2011-08-31: qty 1

## 2011-08-31 MED ORDER — DROPERIDOL 2.5 MG/ML IJ SOLN: 1.2500 mg | Freq: Once | INTRAMUSCULAR | Status: DC

## 2011-08-31 MED ORDER — KETOROLAC TROMETHAMINE 30 MG/ML IJ SOLN
30.0000 mg | Freq: Once | INTRAMUSCULAR | Status: AC
  Administered 2011-08-31: 30 mg via INTRAVENOUS
  Filled 2011-08-31: qty 1

## 2011-08-31 MED ORDER — SODIUM CHLORIDE 0.9 % IV SOLN
1000.0000 mL | Freq: Once | INTRAVENOUS | Status: AC
  Administered 2011-08-31: 1000 mL via INTRAVENOUS

## 2011-08-31 MED ORDER — METOCLOPRAMIDE HCL 5 MG/ML IJ SOLN
10.0000 mg | Freq: Once | INTRAMUSCULAR | Status: AC
  Administered 2011-08-31: 10 mg via INTRAVENOUS
  Filled 2011-08-31: qty 2

## 2011-08-31 MED ORDER — POTASSIUM CHLORIDE ER 10 MEQ PO TBCR: 20.0000 meq | EXTENDED_RELEASE_TABLET | Freq: Two times a day (BID) | ORAL | Status: DC

## 2011-08-31 MED ORDER — SODIUM CHLORIDE 0.9 % IV BOLUS (SEPSIS)
1000.0000 mL | Freq: Once | INTRAVENOUS | Status: AC
  Administered 2011-08-31: 1000 mL via INTRAVENOUS

## 2011-08-31 MED ORDER — PROMETHAZINE HCL 25 MG RE SUPP: 25.0000 mg | Freq: Four times a day (QID) | RECTAL | Status: DC | PRN

## 2011-08-31 MED ORDER — POTASSIUM CHLORIDE CRYS ER 20 MEQ PO TBCR
40.0000 meq | EXTENDED_RELEASE_TABLET | Freq: Once | ORAL | Status: AC
  Administered 2011-08-31: 40 meq via ORAL
  Filled 2011-08-31: qty 2

## 2011-08-31 MED ORDER — FENTANYL CITRATE 0.05 MG/ML IJ SOLN
50.0000 ug | Freq: Once | INTRAMUSCULAR | Status: AC
  Administered 2011-08-31: 50 ug via INTRAVENOUS
  Filled 2011-08-31: qty 2

## 2011-08-31 MED ORDER — POTASSIUM CHLORIDE 10 MEQ/100ML IV SOLN
10.0000 meq | INTRAVENOUS | Status: AC
  Administered 2011-08-31 (×2): 10 meq via INTRAVENOUS
  Filled 2011-08-31 (×2): qty 100

## 2011-08-31 NOTE — ED Provider Notes (Signed)
History   This chart was scribed for Ward Givens, MD by Sofie Rower. The patient was seen in room APA18/APA18 and the patient's care was started at 4:53 PM     CSN: [id number]  Arrival date & time 08/31/11  1636   First MD Initiated Contact with Patient 08/31/11 1642      Chief Complaint  Patient presents with  . Abdominal Pain    (Consider location/radiation/quality/duration/timing/severity/associated sxs/prior treatment) HPI  Renee Gutierrez is a 51 y.o. female who presents to the Emergency Department complaining of abdominal pain onset four months ago with associated symptoms headache (onset two hours ago, located at the front of the head, pt describes the headache as a throbbing pain that starts in her neck and radiates over the top of her head to the front, and it is like headaches she has had before), chronic back pain with chronic leg pain, nausea. Pt is unable to have vomiting b/o a esophageal stent. Pt states after eating about 3 hrs ago she started getting pain in her chest that radiates into her back. Pt states she gets pain every time she eats or drinks. Patient states that she has chronic pain in her back and legs and was treated by Dr. Arletha Grippe, neurosurgeon in [place] but that she has left. Dr. Eulah Pont did 2 neck surgeries on her. She relates she's been referred to [facility] for second opinion for possible back surgery. She also states she's been referred to [facility] for chronic pain management but has not had her appointment yet. Patient denies being on any chronic pain medication.  The pt informs the EDP that she still has the stent in place. The pt reports the abdominal pain as a throbbing pain. The pt informs the EDP that she is not taking any pain medication at present. The last time the pt had pain medication was May, 2013.   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 Arletha Grippe).   Pt denies photophobia.   PCP is Dr. Harland Dingwall in [place]. Pt lives in [place].    Past Medical History  Diagnosis Date  . Hypertension   . GERD (gastroesophageal reflux disease)   . Sciatica   . Arthritis   . Migraines   . Anxiety   . Hyperlipidemia   . Hiatal hernia   . Fibromyalgia   . IBS (irritable bowel syndrome)   . S/P Nissen fundoplication (without gastrostomy tube) procedure     Past Surgical History  Procedure Date  . Stent to esophagus   . 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 Appendectomy   . Abdominal hysterectomy   . Cholecystectomy   . Cervical fusion     History reviewed. No pertinent family history.  History  Substance Use Topics  . Smoking status: Former Games developer  . Smokeless tobacco: Not on file  . Alcohol Use: No   Pt does not smoke.  Pt does not drink.  Pt is on disability for chronic back and neck pain.  OB History    Grav Para Term Preterm Abortions TAB SAB Ect Mult Living                  Review of Systems  All other systems reviewed and are  negative.    10 Systems reviewed and all are negative for acute change except as noted in the HPI.    Allergies  Cymbalta; Lyrica; and Neurontin  Home Medications   Current Outpatient Rx  Name Route Sig Dispense Refill  . ACETAMINOPHEN 325 MG PO TABS Oral Take 650 mg by mouth every 6 (six) hours as needed. For pain alternating with Ibuprofen    . BUDESONIDE-FORMOTEROL FUMARATE 160-4.5 MCG/ACT IN AERO Inhalation Inhale 2 puffs into the lungs 2 (two) times daily.    Marland Kitchen CLONIDINE HCL 0.3 MG PO TABS Oral Take 0.3 mg by mouth every morning.    Marland Kitchen DIPHENHYDRAMINE-APAP (SLEEP) 25-500 MG PO TABS Oral Take 2 tablets by mouth at bedtime as needed. For sleep/pain    . FLUOXETINE HCL 20 MG PO CAPS Oral Take 20 mg by mouth  daily. Take one tablet in addition to 40mg  capsule for a total of 60mg  daily    . FLUOXETINE HCL 40 MG PO CAPS Oral Take 40 mg by mouth every morning. Take one capsule in addition to 20mg  tablet for a total of 60mg  daily    . IBUPROFEN 200 MG PO TABS Oral Take 800 mg by mouth every 4 (four) hours as needed. For pain in alternation with Tylenol    . IPRATROPIUM BROMIDE 0.02 % IN SOLN Nebulization Take 500 mcg by nebulization 3 (three) times daily as needed.    Marland Kitchen LISINOPRIL-HYDROCHLOROTHIAZIDE 20-12.5 MG PO TABS Oral Take 1 tablet by mouth daily.    Marland Kitchen POTASSIUM CHLORIDE ER 10 MEQ PO TBCR Oral Take 1 tablet (10 mEq total) by mouth 2 (two) times daily. 4 tablet 0  . PROMETHAZINE HCL 25 MG PO TABS Oral Take 25 mg by mouth every 6 (six) hours as needed. For nausea    . ROPINIROLE HCL 3 MG PO TABS Oral Take 3 mg by mouth at bedtime. Taken 2 hours prior to bedtime    . SUMATRIPTAN SUCCINATE 6 MG/0.5ML [place] SOLN Subcutaneous Inject 6 mg into the skin every 2 (two) hours as needed. For migraine    . ZONISAMIDE 100 MG PO CAPS Oral Take 100 mg by mouth at bedtime.      BP 167/101  Pulse 111  Temp 98.2 F (36.8 C) (Oral)  Resp 20  Ht 4\' 11"  (1.499 m)  Wt 154 lb 2 oz (69.911 kg)  BMI 31.13 kg/m2  SpO2 100%  Vital signs normal tachycardia, hypertension   Physical Exam  Nursing note and vitals reviewed. Constitutional: She is oriented to person, place, and time. She appears well-developed and well-nourished.  HENT:  Head: Normocephalic and atraumatic.  Right Ear: External ear normal.  Left Ear: External ear normal.  Nose: Nose normal.       Dry tongue.    Eyes: Conjunctivae and EOM are normal. Pupils are equal, round, and reactive to light.  Neck: Normal range of motion. Neck supple.  Cardiovascular: Normal rate, regular rhythm and normal heart sounds.   Pulmonary/Chest: Effort normal and breath sounds normal. No respiratory distress. She has no wheezes. She has no rales. She exhibits no  tenderness.  Abdominal: Soft. Bowel sounds are normal. She exhibits no distension. There is no tenderness. There is no rebound and no guarding.  Musculoskeletal: Normal range of motion. She exhibits no edema and no tenderness.  Neurological: She is alert and oriented to person, place, and time.  Skin: Skin is warm and dry.  Psychiatric: She has a normal mood and affect. Her behavior is  normal.    ED Course  Procedures (including critical care time)   Medications  potassium chloride 10 mEq in 100 mL IVPB (10 mEq Intravenous New Bag/Given 08/31/11 1933)  sodium chloride 0.9 % bolus 1,000 mL (1000 mL Intravenous Given 08/31/11 1727)  metoCLOPramide (REGLAN) injection 10 mg (10 mg Intravenous Given 08/31/11 1730)  diphenhydrAMINE (BENADRYL) injection 25 mg (25 mg Intravenous Given 08/31/11 1730)  ketorolac (TORADOL) 30 MG/ML injection 30 mg (30 mg Intravenous Given 08/31/11 1731)  fentaNYL (SUBLIMAZE) injection 50 mcg (50 mcg Intravenous Given 08/31/11 1732)  potassium chloride SA (K-DUR,KLOR-CON) CR tablet 40 mEq (40 mEq Oral Given 08/31/11 1835)  0.9 %  sodium chloride infusion (1000 mL Intravenous New Bag/Given 08/31/11 1913)     Patient was seen in the ED on May 17 and had CT of the abdomen/pelvis that did not show any acute problems, CT anterior chest which did not show any PE.  Review of the [place] controlled substances report has no prescriptions and patient's name is not in the database  5:04PM- EDP at bedside discusses treatment plan concerning IV fluids, nausea management, pain management, urine sample. EDP discusses prior ct scans.   6:51PM- EDP at bedside. Pt reports she is feeling better at this time. Has not had urine output, will given another liter of IV fluids. Getting her IV potassium infusion.   8:19PM- EDP at bedside, pt reports she is feeling better.   Results for orders placed during the hospital encounter of 08/31/11  URINALYSIS, ROUTINE W REFLEX MICROSCOPIC       Component Value Range   Color, Urine YELLOW  YELLOW   APPearance CLEAR  CLEAR   Specific Gravity, Urine 1.025  1.005 - 1.030   pH 6.0  5.0 - 8.0   Glucose, UA NEGATIVE  NEGATIVE mg/dL   Hgb urine dipstick NEGATIVE  NEGATIVE   Bilirubin Urine NEGATIVE  NEGATIVE   Ketones, ur TRACE (*) NEGATIVE mg/dL   Protein, ur NEGATIVE  NEGATIVE mg/dL   Urobilinogen, UA 0.2  0.0 - 1.0 mg/dL   Nitrite NEGATIVE  NEGATIVE   Leukocytes, UA NEGATIVE  NEGATIVE  CBC WITH DIFFERENTIAL      Component Value Range   WBC 8.9  4.0 - 10.5 K/uL   RBC 4.59  3.87 - 5.11 MIL/uL   Hemoglobin 13.7  12.0 - 15.0 g/dL   HCT 16.1  09.6 - 04.5 %   MCV 88.0  78.0 - 100.0 fL   MCH 29.8  26.0 - 34.0 pg   MCHC 33.9  30.0 - 36.0 g/dL   RDW 40.9  81.1 - 91.4 %   Platelets 261  150 - 400 K/uL   Neutrophils Relative 57  43 - 77 %   Neutro Abs 5.0  1.7 - 7.7 K/uL   Lymphocytes Relative 37  12 - 46 %   Lymphs Abs 3.3  0.7 - 4.0 K/uL   Monocytes Relative 4  3 - 12 %   Monocytes Absolute 0.4  0.1 - 1.0 K/uL   Eosinophils Relative 1  0 - 5 %   Eosinophils Absolute 0.1  0.0 - 0.7 K/uL   Basophils Relative 1  0 - 1 %   Basophils Absolute 0.0  0.0 - 0.1 K/uL  COMPREHENSIVE METABOLIC PANEL      Component Value Range   Sodium 138  135 - 145 mEq/L   Potassium 2.8 (*) 3.5 - 5.1 mEq/L   Chloride 99  96 - 112 mEq/L   CO2  30  19 - 32 mEq/L   Glucose, Bld 113 (*) 70 - 99 mg/dL   BUN 10  6 - 23 mg/dL   Creatinine, Ser 2.13  0.50 - 1.10 mg/dL   Calcium 9.8  8.4 - 08.6 mg/dL   Total Protein 7.0  6.0 - 8.3 g/dL   Albumin 3.6  3.5 - 5.2 g/dL   AST 16  0 - 37 U/L   ALT 16  0 - 35 U/L   Alkaline Phosphatase 69  39 - 117 U/L   Total Bilirubin 0.2 (*) 0.3 - 1.2 mg/dL   GFR calc non Af Amer >90  >90 mL/min   GFR calc Af Amer >90  >90 mL/min   No results found.    Date: 08/31/2011  Rate: 100  Rhythm: normal sinus rhythm  QRS Axis: normal  Intervals: QT prolonged  ST/T Wave abnormalities: nonspecific ST changes  Conduction  Disutrbances:none  Narrative Interpretation:   Old EKG Reviewed: unchanged from 08/01/2010 Unable to use inapsine b/o prolonged QTIc   1. Nausea   2. Dehydration   3. Hypokalemia   4. Chronic pain    New Prescriptions   POTASSIUM CHLORIDE (K-DUR) 10 MEQ TABLET    Take 2 tablets (20 mEq total) by mouth 2 (two) times daily.   PROMETHAZINE (PHENERGAN) 25 MG SUPPOSITORY    Place 1 suppository (25 mg total) rectally every 6 (six) hours as needed for nausea.    Plan discharge  Devoria Albe, MD, FACEP    MDM   I personally performed the services described in this documentation, which was scribed in my presence. The recorded information has been reviewed and considered.  Devoria Albe, MD, Armando Gang    Ward Givens, MD 08/31/11 2031

## 2011-08-31 NOTE — ED Notes (Signed)
Patient lying in bed with no complaints. Will continue to monitor

## 2011-08-31 NOTE — ED Notes (Signed)
Pt reports ab pain since surgery in April, pain is worse today, has migraine and arms and legs hurt, unable to vomit--has stent in throat (was placed wrong and she can't vomit), last bm today, denies any fever.

## 2011-09-12 DIAGNOSIS — F411 Generalized anxiety disorder: Secondary | ICD-10-CM | POA: Insufficient documentation

## 2011-10-30 ENCOUNTER — Encounter (HOSPITAL_COMMUNITY): Payer: Self-pay | Admitting: *Deleted

## 2011-10-30 ENCOUNTER — Emergency Department (HOSPITAL_COMMUNITY)
Admission: EM | Admit: 2011-10-30 | Discharge: 2011-10-31 | Disposition: A | Discharge status: Home / Self Care | Payer: Medicare Other | Attending: Emergency Medicine | Admitting: Emergency Medicine

## 2011-10-30 ENCOUNTER — Emergency Department (HOSPITAL_COMMUNITY): Payer: Medicare Other

## 2011-10-30 DIAGNOSIS — N83209 Unspecified ovarian cyst, unspecified side: Secondary | ICD-10-CM | POA: Insufficient documentation

## 2011-10-30 DIAGNOSIS — Z8739 Personal history of other diseases of the musculoskeletal system and connective tissue: Secondary | ICD-10-CM | POA: Insufficient documentation

## 2011-10-30 DIAGNOSIS — R109 Unspecified abdominal pain: Secondary | ICD-10-CM | POA: Insufficient documentation

## 2011-10-30 DIAGNOSIS — E876 Hypokalemia: Secondary | ICD-10-CM | POA: Insufficient documentation

## 2011-10-30 DIAGNOSIS — E785 Hyperlipidemia, unspecified: Secondary | ICD-10-CM | POA: Insufficient documentation

## 2011-10-30 DIAGNOSIS — Z9089 Acquired absence of other organs: Secondary | ICD-10-CM | POA: Insufficient documentation

## 2011-10-30 DIAGNOSIS — K219 Gastro-esophageal reflux disease without esophagitis: Secondary | ICD-10-CM | POA: Insufficient documentation

## 2011-10-30 DIAGNOSIS — Z79899 Other long term (current) drug therapy: Secondary | ICD-10-CM | POA: Insufficient documentation

## 2011-10-30 DIAGNOSIS — I10 Essential (primary) hypertension: Secondary | ICD-10-CM | POA: Insufficient documentation

## 2011-10-30 DIAGNOSIS — K589 Irritable bowel syndrome without diarrhea: Secondary | ICD-10-CM | POA: Insufficient documentation

## 2011-10-30 DIAGNOSIS — Z87891 Personal history of nicotine dependence: Secondary | ICD-10-CM | POA: Insufficient documentation

## 2011-10-30 LAB — URINALYSIS, ROUTINE W REFLEX MICROSCOPIC
Bilirubin Urine: NEGATIVE
Glucose, UA: NEGATIVE mg/dL
Hgb urine dipstick: NEGATIVE
Ketones, ur: NEGATIVE mg/dL
Leukocytes, UA: NEGATIVE
Nitrite: NEGATIVE
Protein, ur: NEGATIVE mg/dL
Specific Gravity, Urine: 1.015 (ref 1.005–1.030)
Urobilinogen, UA: 0.2 mg/dL (ref 0.0–1.0)
pH: 5.5 (ref 5.0–8.0)

## 2011-10-30 LAB — CBC WITH DIFFERENTIAL/PLATELET
Basophils Absolute: 0.1 10*3/uL (ref 0.0–0.1)
Basophils Relative: 1 % (ref 0–1)
Eosinophils Absolute: 0.2 10*3/uL (ref 0.0–0.7)
Eosinophils Relative: 1 % (ref 0–5)
HCT: 38.4 % (ref 36.0–46.0)
Hemoglobin: 13 g/dL (ref 12.0–15.0)
Lymphocytes Relative: 48 % — ABNORMAL HIGH (ref 12–46)
Lymphs Abs: 3.7 10*3/uL (ref 0.7–4.0)
MCH: 30.4 pg (ref 26.0–34.0)
MCHC: 33.9 g/dL (ref 30.0–36.0)
MCV: 89.7 fL (ref 78.0–100.0)
Monocytes Absolute: 0.5 10*3/uL (ref 0.1–1.0)
Monocytes Relative: 7 % (ref 3–12)
Neutro Abs: 3.3 10*3/uL (ref 1.7–7.7)
Neutrophils Relative %: 43 % (ref 43–77)
Platelets: 240 10*3/uL (ref 150–400)
RBC: 4.28 MIL/uL (ref 3.87–5.11)
RDW: 13.6 % (ref 11.5–15.5)
WBC: 7.8 10*3/uL (ref 4.0–10.5)

## 2011-10-30 LAB — COMPREHENSIVE METABOLIC PANEL
ALT: 21 U/L (ref 0–35)
AST: 19 U/L (ref 0–37)
Albumin: 3.5 g/dL (ref 3.5–5.2)
Alkaline Phosphatase: 67 U/L (ref 39–117)
BUN: 17 mg/dL (ref 6–23)
CO2: 27 mEq/L (ref 19–32)
Calcium: 9.4 mg/dL (ref 8.4–10.5)
Chloride: 106 mEq/L (ref 96–112)
Creatinine, Ser: 1.08 mg/dL (ref 0.50–1.10)
GFR calc Af Amer: 67 mL/min — ABNORMAL LOW (ref >90)
GFR calc non Af Amer: 58 mL/min — ABNORMAL LOW (ref >90)
Glucose, Bld: 103 mg/dL — ABNORMAL HIGH (ref 70–99)
Potassium: 2.6 mEq/L — CL (ref 3.5–5.1)
Sodium: 143 mEq/L (ref 135–145)
Total Bilirubin: 0.1 mg/dL — ABNORMAL LOW (ref 0.3–1.2)
Total Protein: 6.6 g/dL (ref 6.0–8.3)

## 2011-10-30 LAB — LIPASE, BLOOD: Lipase: 32 U/L (ref 11–59)

## 2011-10-30 MED ORDER — HYDROMORPHONE HCL PF 1 MG/ML IJ SOLN
1.0000 mg | Freq: Once | INTRAMUSCULAR | Status: AC
  Administered 2011-10-30: 1 mg via INTRAVENOUS
  Filled 2011-10-30: qty 1

## 2011-10-30 MED ORDER — IOHEXOL 300 MG/ML  SOLN
100.0000 mL | Freq: Once | INTRAMUSCULAR | Status: AC | PRN
  Administered 2011-10-30: 100 mL via INTRAVENOUS

## 2011-10-30 MED ORDER — ONDANSETRON HCL 4 MG/2ML IJ SOLN
4.0000 mg | Freq: Once | INTRAMUSCULAR | Status: AC
  Administered 2011-10-30: 4 mg via INTRAVENOUS
  Filled 2011-10-30: qty 2

## 2011-10-30 MED ORDER — POTASSIUM CHLORIDE 20 MEQ/15ML (10%) PO SOLN
40.0000 meq | Freq: Once | ORAL | Status: AC
  Administered 2011-10-30: 40 meq via ORAL
  Filled 2011-10-30 (×2): qty 30

## 2011-10-30 NOTE — ED Notes (Signed)
K+ 2.6, Dr Preston Fleeting Notified.

## 2011-10-30 NOTE — ED Notes (Signed)
Pt complaining of pain 10/10, dr Preston Fleeting notified, ordered repeat of 1mg  dilauded

## 2011-10-30 NOTE — ED Provider Notes (Signed)
History   This chart was scribed for Dione Booze, MD by Gerlean Ren. This patient was seen in room APA10/APA10 and the patient's care was started at 8:48PM.   CSN: [id number]  Arrival date & time 10/30/11  2020   First MD Initiated Contact with Patient 10/30/11 2042      Chief Complaint  Patient presents with  . Abdominal Pain    (Consider location/radiation/quality/duration/timing/severity/associated sxs/prior treatment) The history is provided by the patient. No language interpreter was used.   Renee Gutierrez is a 51 y.o. female who presents to the Emergency Department complaining of epigastric pain described as a 9 out of 10 radiating to lower back that has been going on for a long period of time but has gotten noticeably more severe last night after having colonoscopy and endoscopy yesterday.  Pt reports that pain has not changed in type, but has only increased in severity.  Pt reports high volume of flatulence as expected after colonoscopy.  Pt denies any associated shoulder and neck pain.  Pt reports nausea.  Pt denies emesis.  Pt reports drinking any liquid and eating greasy food worsen pain.  Pt reports laying still can, but does not always, improve pain.  Pt has h/o HTN, GERD, and IBS.  Pt is a former smoker and denies alcohol use.    Past Medical History  Diagnosis Date  . Hypertension   . GERD (gastroesophageal reflux disease)   . Sciatica   . Arthritis   . Migraines   . Anxiety   . Hyperlipidemia   . Hiatal hernia   . Fibromyalgia   . IBS (irritable bowel syndrome)   . S/P Nissen fundoplication (without gastrostomy tube) procedure     Past Surgical History  Procedure Date  . Stent to esophagus   . 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 Appendectomy   . Abdominal hysterectomy   . Cholecystectomy   . Cervical fusion     History reviewed. No pertinent family history.  History  Substance Use Topics  . Smoking status: Former Games developer  .  Smokeless tobacco: Not on file  . Alcohol Use: No    OB History    Grav Para Term Preterm Abortions TAB SAB Ect Mult Living                  Review of Systems  Gastrointestinal: Positive for abdominal pain.  All other systems reviewed and are negative.    Allergies  Cymbalta; Lyrica; Neurontin; and Voltaren  Home Medications   Current Outpatient Rx  Name Route Sig Dispense Refill  . BUDESONIDE-FORMOTEROL FUMARATE 160-4.5 MCG/ACT IN AERO Inhalation Inhale 2 puffs into the lungs 2 (two) times daily.    Marland Kitchen CLONIDINE HCL 0.3 MG PO TABS Oral Take 0.3 mg by mouth 2 (two) times daily. Patient takes 1/2 tablet twice a day    . FLUOXETINE HCL 20 MG PO CAPS Oral Take 20 mg by mouth daily. Take one tablet in addition to 40mg  capsule for a total of 60mg  daily    . FLUOXETINE HCL 40 MG PO CAPS Oral Take 40 mg by mouth every morning. Take one capsule in addition to 20mg  tablet for a total of 60mg  daily    . IPRATROPIUM BROMIDE 0.02 % IN SOLN Nebulization Take 500 mcg by nebulization 3 (three) times daily as needed. Shortness of breath    . LISINOPRIL-HYDROCHLOROTHIAZIDE 20-12.5 MG PO TABS Oral Take 1 tablet by mouth daily.    Marland Kitchen  POTASSIUM 95 MG PO TABS Oral Take 1 tablet by mouth daily.    Marland Kitchen PROMETHAZINE HCL 25 MG PO TABS Oral Take 25 mg by mouth every 6 (six) hours as needed. For nausea    . ROPINIROLE HCL 3 MG PO TABS Oral Take 3 mg by mouth 2 (two) times daily. **Taken twice daily---2nd dose is taken 2 hours prior to bedtime    . POTASSIUM CHLORIDE ER 10 MEQ PO TBCR Oral Take 10 mEq by mouth daily.      BP 180/78  Pulse 74  Temp 98.5 F (36.9 C) (Oral)  Resp 20  Ht 4\' 11"  (1.499 m)  SpO2 99%  Physical Exam  Nursing note and vitals reviewed. Constitutional: She is oriented to person, place, and time. She appears well-developed and well-nourished.  HENT:  Head: Normocephalic and atraumatic.  Eyes: Conjunctivae normal and EOM are normal. Pupils are equal, round, and reactive to light.   Neck: Normal range of motion. Neck supple.  Cardiovascular: Normal rate, regular rhythm and normal heart sounds.   Pulmonary/Chest: Effort normal and breath sounds normal.  Abdominal: Soft. Bowel sounds are normal. There is tenderness. There is no rebound and no guarding.       Mild epigastric tenderness.  Musculoskeletal: Normal range of motion.  Neurological: She is alert and oriented to person, place, and time.  Skin: Skin is warm and dry.  Psychiatric: She has a normal mood and affect.    ED Course  Procedures (including critical care time) DIAGNOSTIC STUDIES: Oxygen Saturation is 99% on room air, normal by my interpretation.    COORDINATION OF CARE: 8:52PM- Ordered IV fluids, pain meds, and nausea meds.   Labs Reviewed - No data to display No results found.   Date: 10/30/2011  Rate: 70  Rhythm: normal sinus rhythm  QRS Axis: normal  Intervals: QT prolonged  ST/T Wave abnormalities: nonspecific ST/T changes  Conduction Disutrbances:none  Narrative Interpretation: Borderline prolonged QT interval, minor nonspecific ST and T changes. When compared with ECG of 08/31/2011, no significant changes are seen.  Old EKG Reviewed: unchanged  Results for orders placed during the hospital encounter of 10/30/11  CBC WITH DIFFERENTIAL      Component Value Range   WBC 7.8  4.0 - 10.5 K/uL   RBC 4.28  3.87 - 5.11 MIL/uL   Hemoglobin 13.0  12.0 - 15.0 g/dL   HCT 16.1  09.6 - 04.5 %   MCV 89.7  78.0 - 100.0 fL   MCH 30.4  26.0 - 34.0 pg   MCHC 33.9  30.0 - 36.0 g/dL   RDW 40.9  81.1 - 91.4 %   Platelets 240  150 - 400 K/uL   Neutrophils Relative 43  43 - 77 %   Neutro Abs 3.3  1.7 - 7.7 K/uL   Lymphocytes Relative 48 (*) 12 - 46 %   Lymphs Abs 3.7  0.7 - 4.0 K/uL   Monocytes Relative 7  3 - 12 %   Monocytes Absolute 0.5  0.1 - 1.0 K/uL   Eosinophils Relative 1  0 - 5 %   Eosinophils Absolute 0.2  0.0 - 0.7 K/uL   Basophils Relative 1  0 - 1 %   Basophils Absolute 0.1  0.0 -  0.1 K/uL  COMPREHENSIVE METABOLIC PANEL      Component Value Range   Sodium 143  135 - 145 mEq/L   Potassium 2.6 (*) 3.5 - 5.1 mEq/L   Chloride 106  96 -  112 mEq/L   CO2 27  19 - 32 mEq/L   Glucose, Bld 103 (*) 70 - 99 mg/dL   BUN 17  6 - 23 mg/dL   Creatinine, Ser 0.27  0.50 - 1.10 mg/dL   Calcium 9.4  8.4 - 25.3 mg/dL   Total Protein 6.6  6.0 - 8.3 g/dL   Albumin 3.5  3.5 - 5.2 g/dL   AST 19  0 - 37 U/L   ALT 21  0 - 35 U/L   Alkaline Phosphatase 67  39 - 117 U/L   Total Bilirubin 0.1 (*) 0.3 - 1.2 mg/dL   GFR calc non Af Amer 58 (*) >90 mL/min   GFR calc Af Amer 67 (*) >90 mL/min  LIPASE, BLOOD      Component Value Range   Lipase 32  11 - 59 U/L  URINALYSIS, ROUTINE W REFLEX MICROSCOPIC      Component Value Range   Color, Urine YELLOW  YELLOW   APPearance CLEAR  CLEAR   Specific Gravity, Urine 1.015  1.005 - 1.030   pH 5.5  5.0 - 8.0   Glucose, UA NEGATIVE  NEGATIVE mg/dL   Hgb urine dipstick NEGATIVE  NEGATIVE   Bilirubin Urine NEGATIVE  NEGATIVE   Ketones, ur NEGATIVE  NEGATIVE mg/dL   Protein, ur NEGATIVE  NEGATIVE mg/dL   Urobilinogen, UA 0.2  0.0 - 1.0 mg/dL   Nitrite NEGATIVE  NEGATIVE   Leukocytes, UA NEGATIVE  NEGATIVE   Ct Abdomen Pelvis W Contrast  10/30/2011  *RADIOLOGY REPORT*  Clinical Data: Epigastric abdominal pain radiating to the back, lower abdominal and pelvic pain.  Previous Nissen fundoplication, hysterectomy, cholecystectomy and appendectomy.  CT ABDOMEN AND PELVIS WITH CONTRAST  Technique:  Multidetector CT imaging of the abdomen and pelvis was performed following the standard protocol during bolus administration of intravenous contrast.  Contrast: OMNIPAQUE IOHEXOL 300 MG/ML  SOLN  Comparison: 07/05/2011.  Findings: Interval small amount of linear atelectasis or scarring at the left lung base with stable linear scarring at the right lung base.  Cholecystectomy clips.  Surgically absent uterus.  Interval 3.0 cm left ovarian cyst with a thin  peripheral enhancing wall. Small amount of adjacent free peritoneal fluid.  1.3 cm right ovarian cyst with an interval mild decrease in size.  Unremarkable liver, spleen, pancreas, adrenal glands, kidneys and urinary bladder.  No intestinal abnormalities or enlarged lymph nodes.  Changes of a Nissen fundoplication are again noted.  Mild lumbar and lower thoracic spine degenerative changes.  IMPRESSION:  1.  Interval 3.0 cm left ovarian cyst with a thin peripheral enhancing wall.  This has an interval small amount of adjacent free peritoneal fluid. 2.  Small right ovarian cyst with an interval mild decrease in size.   Original Report Authenticated By: Darrol Angel, M.D.       1. Abdominal pain   2. Hypokalemia       MDM  Exacerbation of chronic abdominal pain. Since she just had colonoscopy and upper and B., CT scan will be obtained to make sure she does not have a complication of those procedures. She'll be given IV fluids and hydromorphone for pain.  She had reasonable pain relief from hydromorphone. CT is unremarkable. However, potassium has come back at 2.6. She's given IV and oral potassium and potassium level will need to be rechecked.  I personally performed the services described in this documentation, which was scribed in my presence. The recorded information has been reviewed  and considered.           Dione Booze, MD 10/31/11 [phone]

## 2011-10-30 NOTE — ED Notes (Signed)
Had endoscopy and colonoscopy yesterday at [facility].   Now having pain epigastric region. N/v/d

## 2011-10-31 LAB — POTASSIUM
Potassium: 2.8 mEq/L — ABNORMAL LOW (ref 3.5–5.1)
Potassium: 3.2 mEq/L — ABNORMAL LOW (ref 3.5–5.1)

## 2011-10-31 MED ORDER — POTASSIUM CHLORIDE 20 MEQ/15ML (10%) PO SOLN
40.0000 meq | Freq: Once | ORAL | Status: AC
  Administered 2011-10-31: 40 meq via ORAL
  Filled 2011-10-31 (×2): qty 30

## 2011-10-31 MED ORDER — POTASSIUM CHLORIDE ER 10 MEQ PO TBCR: 10.0000 meq | EXTENDED_RELEASE_TABLET | Freq: Two times a day (BID) | ORAL | Status: DC

## 2011-10-31 MED ORDER — POTASSIUM CHLORIDE 10 MEQ/100ML IV SOLN
10.0000 meq | Freq: Once | INTRAVENOUS | Status: AC
  Administered 2011-10-31: 10 meq via INTRAVENOUS
  Filled 2011-10-31: qty 100

## 2011-10-31 NOTE — ED Provider Notes (Signed)
51 year old female with abdominal pain. This seemed care from Dr. Preston Fleeting in sign out. Recent endoscopy yesterday. Imaging today without evidence of perforation or other concerning pathology. Workup significant for hypokalemia. This was repleted. Repeat potassium prior to discharge improved to 3.2. Prescription for additional supplementation provided. Patient is to reports abdominal pain prior to discharge. She has a history of chronic abdominal pain though. No peritoneal signs on my exam. Feel she is safe for discharge at this time. Return precautions were discussed. Outpatient followup.  Raeford Razor, MD 10/31/11 [phone]

## 2013-06-02 ENCOUNTER — Encounter (HOSPITAL_COMMUNITY): Payer: Self-pay | Admitting: Emergency Medicine

## 2013-06-02 ENCOUNTER — Emergency Department (HOSPITAL_COMMUNITY)
Admission: EM | Admit: 2013-06-02 | Discharge: 2013-06-02 | Discharge status: Against Medical Advice | Payer: Medicare HMO | Attending: Emergency Medicine | Admitting: Emergency Medicine

## 2013-06-02 DIAGNOSIS — I10 Essential (primary) hypertension: Secondary | ICD-10-CM | POA: Insufficient documentation

## 2013-06-02 DIAGNOSIS — R079 Chest pain, unspecified: Secondary | ICD-10-CM | POA: Insufficient documentation

## 2013-06-02 NOTE — ED Notes (Signed)
Pt left without being seen.

## 2013-06-02 NOTE — ED Notes (Signed)
Pt co rt sided chest pain off and on for past couple of days per pt, pt states the pain is in same area that she had a tumor removed 2 years prior. Pt states the pain hurts into her back.

## 2013-08-03 DIAGNOSIS — M48062 Spinal stenosis, lumbar region with neurogenic claudication: Secondary | ICD-10-CM | POA: Insufficient documentation

## 2013-08-24 DIAGNOSIS — G5620 Lesion of ulnar nerve, unspecified upper limb: Secondary | ICD-10-CM | POA: Insufficient documentation

## 2013-10-20 DIAGNOSIS — R200 Anesthesia of skin: Secondary | ICD-10-CM | POA: Insufficient documentation

## 2014-03-07 ENCOUNTER — Encounter (HOSPITAL_COMMUNITY): Payer: Self-pay | Admitting: Emergency Medicine

## 2014-03-07 ENCOUNTER — Emergency Department (HOSPITAL_COMMUNITY)
Admission: EM | Admit: 2014-03-07 | Discharge: 2014-03-07 | Disposition: A | Discharge status: Home / Self Care | Payer: Medicare HMO | Attending: Emergency Medicine | Admitting: Emergency Medicine

## 2014-03-07 DIAGNOSIS — Z79899 Other long term (current) drug therapy: Secondary | ICD-10-CM | POA: Diagnosis not present

## 2014-03-07 DIAGNOSIS — Z8719 Personal history of other diseases of the digestive system: Secondary | ICD-10-CM | POA: Diagnosis not present

## 2014-03-07 DIAGNOSIS — Z8619 Personal history of other infectious and parasitic diseases: Secondary | ICD-10-CM | POA: Diagnosis not present

## 2014-03-07 DIAGNOSIS — Z87891 Personal history of nicotine dependence: Secondary | ICD-10-CM | POA: Diagnosis not present

## 2014-03-07 DIAGNOSIS — I10 Essential (primary) hypertension: Secondary | ICD-10-CM | POA: Diagnosis not present

## 2014-03-07 DIAGNOSIS — R531 Weakness: Secondary | ICD-10-CM | POA: Diagnosis not present

## 2014-03-07 DIAGNOSIS — M25511 Pain in right shoulder: Secondary | ICD-10-CM | POA: Diagnosis present

## 2014-03-07 DIAGNOSIS — F419 Anxiety disorder, unspecified: Secondary | ICD-10-CM | POA: Diagnosis not present

## 2014-03-07 DIAGNOSIS — Z8679 Personal history of other diseases of the circulatory system: Secondary | ICD-10-CM | POA: Insufficient documentation

## 2014-03-07 DIAGNOSIS — M792 Neuralgia and neuritis, unspecified: Secondary | ICD-10-CM

## 2014-03-07 DIAGNOSIS — M5410 Radiculopathy, site unspecified: Secondary | ICD-10-CM | POA: Diagnosis not present

## 2014-03-07 MED ORDER — HYDROCODONE-ACETAMINOPHEN 5-325 MG PO TABS: 1.0000 | ORAL_TABLET | Freq: Four times a day (QID) | ORAL | Status: DC | PRN

## 2014-03-07 MED ORDER — NAPROXEN 500 MG PO TABS: 500.0000 mg | ORAL_TABLET | Freq: Two times a day (BID) | ORAL | Status: DC

## 2014-03-07 MED ORDER — OXYCODONE-ACETAMINOPHEN 5-325 MG PO TABS
1.0000 | ORAL_TABLET | Freq: Once | ORAL | Status: AC
  Administered 2014-03-07: 1 via ORAL
  Filled 2014-03-07: qty 1

## 2014-03-07 NOTE — ED Notes (Signed)
Neck and shoulder x 1 month,  States worse today

## 2014-03-07 NOTE — ED Provider Notes (Signed)
CSN: [id number]     Arrival date & time 03/07/14  1050 History  This chart was scribed for non-physician practitioner, Etta Quill, NP, working with Hoy Morn, MD, by Stephania Fragmin, ED Scribe. This patient was seen in room APFT22/APFT22 and the patient's care was started at 12:08 PM.    Chief Complaint  Patient presents with  . Shoulder Pain   Patient is a 54 y.o. female presenting with shoulder pain. The history is provided by the patient. No language interpreter was used.  Shoulder Pain Location:  Shoulder Shoulder location:  R shoulder Pain details:    Radiates to:  R arm (right sided neck)   Severity:  Moderate   Onset quality:  Gradual   Duration:  1 month   Timing:  Constant   Progression:  Worsening Chronicity:  Chronic Dislocation: no   Foreign body present:  No foreign bodies Relieved by:  None tried Worsened by:  Nothing tried Ineffective treatments:  Muscle relaxant and heat (Bengay Hot) Associated symptoms: neck pain      HPI Comments: Renee Gutierrez is a 54 y.o. female with a history of scoliosis and 2 neck surgeries who presents to the Emergency Department complaining of chronic, moderate neck pain radiating down her right shoulder that began 1 month ago but which worsened yesterday. She also complains of chronic intermittent right leg weakness, which has flared up recently. Patient has tried Tramadol, Skelaxin, Bengay Hot, and a heating pad, all of which have been ineffective. Patient has a history of COPD, Restless Legs, adrenal gland tumor, carpal tunnel, any many other conditions (see below). Dr. Percell Miller was her neck surgeon, although her license was revoked. She denies having a PCP. Her husband drove her here today.     Past Medical History  Diagnosis Date  . Hypertension   . GERD (gastroesophageal reflux disease)   . Sciatica   . Arthritis   . Migraines   . Anxiety   . Hyperlipidemia   . Hiatal hernia   . Fibromyalgia   . IBS (irritable bowel  syndrome)   . S/P Nissen fundoplication (without gastrostomy tube) procedure    Past Surgical History  Procedure Laterality Date  . Stent to esophagus    . 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 Appendectomy    . Abdominal hysterectomy    . Cholecystectomy    . Cervical fusion     No family history on file. History  Substance Use Topics  . Smoking status: Former Research scientist (life sciences)  . Smokeless tobacco: Not on file  . Alcohol Use: No   OB History    No data available     Review of Systems  Musculoskeletal: Positive for myalgias and neck pain.  Neurological: Positive for weakness.  All other systems reviewed and are negative.     Allergies  Cymbalta; Lyrica; Neurontin; and Voltaren  Home Medications   Prior to Admission medications   Medication Sig Start Date End Date Taking? Authorizing Provider  amLODipine (NORVASC) 5 MG tablet Take 5 mg by mouth 2 (two) times daily.   Yes Historical Provider, MD  budesonide-formoterol (SYMBICORT) 160-4.5 MCG/ACT inhaler Inhale 2 puffs into the lungs 2 (two) times daily.   Yes Historical Provider, MD  cloNIDine (CATAPRES) 0.2 MG tablet Take 0.2 mg by mouth 2 (two) times daily.   Yes Historical Provider, MD  furosemide (LASIX) 20 MG tablet Take 20 mg by mouth daily.   Yes Historical Provider,  MD  ipratropium (ATROVENT) 0.02 % nebulizer solution Take 500 mcg by nebulization 3 (three) times daily as needed. Shortness of breath   Yes Historical Provider, MD  lisinopril-hydrochlorothiazide (PRINZIDE,ZESTORETIC) 20-12.5 MG per tablet Take 1 tablet by mouth daily.   Yes Historical Provider, MD  metaxalone (SKELAXIN) 800 MG tablet Take 800 mg by mouth 2 (two) times daily as needed. pain 03/03/14  Yes Historical Provider, MD  promethazine (PHENERGAN) 25 MG tablet Take 25 mg by mouth every 6 (six) hours as needed. For nausea   Yes Historical Provider, MD  rOPINIRole (REQUIP) 3 MG tablet Take 3 mg by mouth 2 (two) times daily. **Taken  twice daily---2nd dose is taken 2 hours prior to bedtime   Yes Historical Provider, MD  labetalol (NORMODYNE) 200 MG tablet Take 200 mg by mouth 2 (two) times daily.    Historical Provider, MD  traMADol (ULTRAM) 50 MG tablet Take 50 mg by mouth every 6 (six) hours as needed. pain 02/24/14   Historical Provider, MD   Pulse 92  Temp(Src) 98.5 F (36.9 C) (Oral)  Resp 18  Ht 5' (1.524 m)  Wt 170 lb (77.111 kg)  BMI 33.20 kg/m2  SpO2 98% Physical Exam  Constitutional: She is oriented to person, place, and time. She appears well-developed and well-nourished. No distress.  HENT:  Head: Normocephalic and atraumatic.  Eyes: Conjunctivae and EOM are normal.  Neck: Neck supple. No tracheal deviation present.  Cardiovascular: Normal rate.   Pulmonary/Chest: Effort normal. No respiratory distress.  Musculoskeletal: Normal range of motion.  C4 discomfort radiating to right shoulder and right arm. No strength deficits noted.  Neurological: She is alert and oriented to person, place, and time. She has normal strength.  Skin: Skin is warm and dry.  Psychiatric: She has a normal mood and affect. Her behavior is normal.  Nursing note and vitals reviewed.   ED Course  Procedures (including critical care time)  DIAGNOSTIC STUDIES: Oxygen Saturation is 98% on room air, normal by my interpretation.    COORDINATION OF CARE: 12:10 PM - Discussed treatment plan with pt at bedside which includes anti-inflammatory and pain medication and pt agreed to plan.   Labs Review Labs Reviewed - No data to display  Imaging Review No results found.   EKG Interpretation None      MDM   Final diagnoses:  None   Radicular pain.  No extremity weakness or concerning neurologic findings. Anti-inflammatory.  Follow-up with your specialist at [facility] as scheduled on 03/11/14.  I personally performed the services described in this documentation, which was scribed in my presence. The recorded information has been  reviewed and is accurate.     Norman Herrlich, NP 03/07/14 [place], MD 03/09/14 [phone]

## 2014-03-07 NOTE — Discharge Instructions (Signed)

## 2014-04-20 DIAGNOSIS — M7918 Myalgia, other site: Secondary | ICD-10-CM | POA: Insufficient documentation

## 2014-06-06 DIAGNOSIS — M47812 Spondylosis without myelopathy or radiculopathy, cervical region: Secondary | ICD-10-CM | POA: Insufficient documentation

## 2014-07-07 DIAGNOSIS — D352 Benign neoplasm of pituitary gland: Secondary | ICD-10-CM | POA: Insufficient documentation

## 2014-07-10 DIAGNOSIS — D3501 Benign neoplasm of right adrenal gland: Secondary | ICD-10-CM | POA: Insufficient documentation

## 2014-11-22 DIAGNOSIS — G609 Hereditary and idiopathic neuropathy, unspecified: Secondary | ICD-10-CM | POA: Insufficient documentation

## 2014-12-03 ENCOUNTER — Encounter (HOSPITAL_COMMUNITY): Payer: Self-pay | Admitting: Emergency Medicine

## 2014-12-03 ENCOUNTER — Emergency Department (HOSPITAL_COMMUNITY): Payer: Medicare PPO

## 2014-12-03 ENCOUNTER — Emergency Department (HOSPITAL_COMMUNITY)
Admission: EM | Admit: 2014-12-03 | Discharge: 2014-12-03 | Disposition: A | Discharge status: Home / Self Care | Payer: Medicare PPO | Attending: Emergency Medicine | Admitting: Emergency Medicine

## 2014-12-03 DIAGNOSIS — Z9889 Other specified postprocedural states: Secondary | ICD-10-CM | POA: Insufficient documentation

## 2014-12-03 DIAGNOSIS — Z86018 Personal history of other benign neoplasm: Secondary | ICD-10-CM | POA: Diagnosis not present

## 2014-12-03 DIAGNOSIS — J111 Influenza due to unidentified influenza virus with other respiratory manifestations: Secondary | ICD-10-CM | POA: Insufficient documentation

## 2014-12-03 DIAGNOSIS — Z8719 Personal history of other diseases of the digestive system: Secondary | ICD-10-CM | POA: Diagnosis not present

## 2014-12-03 DIAGNOSIS — M1990 Unspecified osteoarthritis, unspecified site: Secondary | ICD-10-CM | POA: Diagnosis not present

## 2014-12-03 DIAGNOSIS — Z791 Long term (current) use of non-steroidal anti-inflammatories (NSAID): Secondary | ICD-10-CM | POA: Insufficient documentation

## 2014-12-03 DIAGNOSIS — Z79899 Other long term (current) drug therapy: Secondary | ICD-10-CM | POA: Diagnosis not present

## 2014-12-03 DIAGNOSIS — Z87891 Personal history of nicotine dependence: Secondary | ICD-10-CM | POA: Insufficient documentation

## 2014-12-03 DIAGNOSIS — R69 Illness, unspecified: Secondary | ICD-10-CM

## 2014-12-03 DIAGNOSIS — Z7951 Long term (current) use of inhaled steroids: Secondary | ICD-10-CM | POA: Diagnosis not present

## 2014-12-03 DIAGNOSIS — I10 Essential (primary) hypertension: Secondary | ICD-10-CM | POA: Insufficient documentation

## 2014-12-03 DIAGNOSIS — G43909 Migraine, unspecified, not intractable, without status migrainosus: Secondary | ICD-10-CM | POA: Insufficient documentation

## 2014-12-03 DIAGNOSIS — F419 Anxiety disorder, unspecified: Secondary | ICD-10-CM | POA: Diagnosis not present

## 2014-12-03 DIAGNOSIS — R112 Nausea with vomiting, unspecified: Secondary | ICD-10-CM | POA: Diagnosis present

## 2014-12-03 DIAGNOSIS — Z8639 Personal history of other endocrine, nutritional and metabolic disease: Secondary | ICD-10-CM | POA: Insufficient documentation

## 2014-12-03 DIAGNOSIS — M797 Fibromyalgia: Secondary | ICD-10-CM | POA: Diagnosis not present

## 2014-12-03 HISTORY — DX: Benign neoplasm of unspecified adrenal gland: D35.00

## 2014-12-03 LAB — CBC WITH DIFFERENTIAL/PLATELET
Basophils Absolute: 0 10*3/uL (ref 0.0–0.1)
Basophils Relative: 1 %
Eosinophils Absolute: 0.2 10*3/uL (ref 0.0–0.7)
Eosinophils Relative: 2 %
HCT: 38 % (ref 36.0–46.0)
Hemoglobin: 12.7 g/dL (ref 12.0–15.0)
Lymphocytes Relative: 32 %
Lymphs Abs: 2.6 10*3/uL (ref 0.7–4.0)
MCH: 29.6 pg (ref 26.0–34.0)
MCHC: 33.4 g/dL (ref 30.0–36.0)
MCV: 88.6 fL (ref 78.0–100.0)
Monocytes Absolute: 0.4 10*3/uL (ref 0.1–1.0)
Monocytes Relative: 6 %
Neutro Abs: 4.7 10*3/uL (ref 1.7–7.7)
Neutrophils Relative %: 59 %
Platelets: 225 10*3/uL (ref 150–400)
RBC: 4.29 MIL/uL (ref 3.87–5.11)
RDW: 13.1 % (ref 11.5–15.5)
WBC: 7.9 10*3/uL (ref 4.0–10.5)

## 2014-12-03 LAB — COMPREHENSIVE METABOLIC PANEL
ALT: 14 U/L (ref 14–54)
AST: 12 U/L — ABNORMAL LOW (ref 15–41)
Albumin: 3.7 g/dL (ref 3.5–5.0)
Alkaline Phosphatase: 70 U/L (ref 38–126)
Anion gap: 7 (ref 5–15)
BUN: 11 mg/dL (ref 6–20)
CO2: 24 mmol/L (ref 22–32)
Calcium: 9.7 mg/dL (ref 8.9–10.3)
Chloride: 110 mmol/L (ref 101–111)
Creatinine, Ser: 0.88 mg/dL (ref 0.44–1.00)
GFR calc Af Amer: >60 mL/min (ref >60)
GFR calc non Af Amer: >60 mL/min (ref >60)
Glucose, Bld: 92 mg/dL (ref 65–99)
Potassium: 3.5 mmol/L (ref 3.5–5.1)
Sodium: 141 mmol/L (ref 135–145)
Total Bilirubin: 0.4 mg/dL (ref 0.3–1.2)
Total Protein: 6.3 g/dL — ABNORMAL LOW (ref 6.5–8.1)

## 2014-12-03 LAB — URINALYSIS, ROUTINE W REFLEX MICROSCOPIC
Bilirubin Urine: NEGATIVE
Glucose, UA: NEGATIVE mg/dL
Hgb urine dipstick: NEGATIVE
Ketones, ur: NEGATIVE mg/dL
Leukocytes, UA: NEGATIVE
Nitrite: NEGATIVE
Protein, ur: NEGATIVE mg/dL
Specific Gravity, Urine: 1.025 (ref 1.005–1.030)
Urobilinogen, UA: 0.2 mg/dL (ref 0.0–1.0)
pH: 6 (ref 5.0–8.0)

## 2014-12-03 LAB — LIPASE, BLOOD: Lipase: 21 U/L — ABNORMAL LOW (ref 22–51)

## 2014-12-03 MED ORDER — ONDANSETRON HCL 4 MG/2ML IJ SOLN
4.0000 mg | Freq: Once | INTRAMUSCULAR | Status: AC
  Administered 2014-12-03: 4 mg via INTRAVENOUS
  Filled 2014-12-03: qty 2

## 2014-12-03 MED ORDER — FENTANYL CITRATE (PF) 100 MCG/2ML IJ SOLN
25.0000 ug | Freq: Once | INTRAMUSCULAR | Status: AC
  Administered 2014-12-03: 25 ug via INTRAVENOUS
  Filled 2014-12-03: qty 2

## 2014-12-03 MED ORDER — SODIUM CHLORIDE 0.9 % IV BOLUS (SEPSIS)
1000.0000 mL | Freq: Once | INTRAVENOUS | Status: AC
  Administered 2014-12-03: 1000 mL via INTRAVENOUS

## 2014-12-03 NOTE — ED Notes (Addendum)
PT states generalized weakness and fatigue x2 weeks with productive greenish sputum cough. PT also c/o bilateral leg aches. PT also states n/v x2 per day for the past 2 weeks. PT also states was started on potassium on 11/22/14 when her blood work from an MD appointment resulted at [facility].

## 2014-12-03 NOTE — ED Provider Notes (Signed)
CSN: [id number]     Arrival date & time 12/03/14  1417 History   First MD Initiated Contact with Patient 12/03/14 1431     Chief Complaint  Patient presents with  . Fatigue     (Consider location/radiation/quality/duration/timing/severity/associated sxs/prior Treatment) HPI Patient presents with concern of feeling generally poorly. Symptoms seem to have begun about 1 week ago, since onset been persistent, with no relief despite taking typical medication. No focal pain. There is increased soreness in both legs. No new loss of sensation or weakness anywhere. No new confusion, disorientation, fever, chills. Patient saw her physician at [facility] one week ago, was started on potassium supplementation.  Past Medical History  Diagnosis Date  . Hypertension   . GERD (gastroesophageal reflux disease)   . Sciatica   . Arthritis   . Migraines   . Anxiety   . Hyperlipidemia   . Hiatal hernia   . Fibromyalgia   . IBS (irritable bowel syndrome)   . S/P Nissen fundoplication (without gastrostomy tube) procedure   . Benign tumor of adrenal gland    Past Surgical History  Procedure Laterality Date  . Stent to esophagus    . 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 Appendectomy    . Abdominal hysterectomy    . Cholecystectomy    . Cervical fusion    . Abdominal surgery      tumor removed   History reviewed. No pertinent family history. Social History  Substance Use Topics  . Smoking status: Former Research scientist (life sciences)  . Smokeless tobacco: None  . Alcohol Use: No   OB History    Gravida Para Term Preterm AB TAB SAB Ectopic Multiple Living   2         2     Review of Systems  Constitutional:       Per HPI, otherwise negative  HENT:       Per HPI, otherwise negative  Respiratory:       Per HPI, otherwise negative  Cardiovascular:       Per HPI, otherwise negative  Gastrointestinal: Positive for nausea and vomiting.  Endocrine:       Negative aside from  HPI  Genitourinary:       Neg aside from HPI   Musculoskeletal:       Per HPI, otherwise negative  Skin: Negative.   Neurological: Negative for syncope.      Allergies  Cymbalta; Lyrica; Neurontin; and Voltaren  Home Medications   Prior to Admission medications   Medication Sig Start Date End Date Taking? Authorizing Provider  amLODipine (NORVASC) 5 MG tablet Take 5 mg by mouth 2 (two) times daily.    Historical Provider, MD  budesonide-formoterol (SYMBICORT) 160-4.5 MCG/ACT inhaler Inhale 2 puffs into the lungs 2 (two) times daily.    Historical Provider, MD  cloNIDine (CATAPRES) 0.2 MG tablet Take 0.2 mg by mouth 2 (two) times daily.    Historical Provider, MD  furosemide (LASIX) 20 MG tablet Take 20 mg by mouth daily.    Historical Provider, MD  HYDROcodone-acetaminophen (NORCO) 5-325 MG per tablet Take 1 tablet by mouth every 6 (six) hours as needed for severe pain. 03/07/14   Etta Quill, NP  ipratropium (ATROVENT) 0.02 % nebulizer solution Take 500 mcg by nebulization 3 (three) times daily as needed. Shortness of breath    Historical Provider, MD  labetalol (NORMODYNE) 200 MG tablet Take 200 mg by mouth 2 (two) times daily.  Historical Provider, MD  lisinopril-hydrochlorothiazide (PRINZIDE,ZESTORETIC) 20-12.5 MG per tablet Take 1 tablet by mouth daily.    Historical Provider, MD  metaxalone (SKELAXIN) 800 MG tablet Take 800 mg by mouth 2 (two) times daily as needed. pain 03/03/14   Historical Provider, MD  naproxen (NAPROSYN) 500 MG tablet Take 1 tablet (500 mg total) by mouth 2 (two) times daily. 03/07/14   Etta Quill, NP  promethazine (PHENERGAN) 25 MG tablet Take 25 mg by mouth every 6 (six) hours as needed. For nausea    Historical Provider, MD  rOPINIRole (REQUIP) 3 MG tablet Take 3 mg by mouth 2 (two) times daily. **Taken twice daily---2nd dose is taken 2 hours prior to bedtime    Historical Provider, MD  traMADol (ULTRAM) 50 MG tablet Take 50 mg by mouth every 6 (six) hours  as needed. pain 02/24/14   Historical Provider, MD   BP 106/71 mmHg  Pulse 64  Temp(Src) 98 F (36.7 C) (Oral)  Resp 20  Ht 5' (1.524 m)  Wt 158 lb (71.668 kg)  BMI 30.86 kg/m2  SpO2 98% Physical Exam  Constitutional: She is oriented to person, place, and time. She appears well-developed and well-nourished. No distress.  HENT:  Head: Normocephalic and atraumatic.  Eyes: Conjunctivae and EOM are normal.  Cardiovascular: Normal rate and regular rhythm.   Pulmonary/Chest: Effort normal and breath sounds normal. No stridor. No respiratory distress.  Abdominal: She exhibits no distension.  Musculoskeletal: She exhibits no edema.  Neurological: She is alert and oriented to person, place, and time. No cranial nerve deficit.  Skin: Skin is warm and dry.  Psychiatric: She has a normal mood and affect.  Nursing note and vitals reviewed.   ED Course  Procedures (including critical care time) Labs Review Labs Reviewed  COMPREHENSIVE METABOLIC PANEL - Abnormal; Notable for the following:    Total Protein 6.3 (*)    AST 12 (*)    All other components within normal limits  LIPASE, BLOOD - Abnormal; Notable for the following:    Lipase 21 (*)    All other components within normal limits  URINALYSIS, ROUTINE W REFLEX MICROSCOPIC (NOT AT [facility]) - Abnormal; Notable for the following:    APPearance CLOUDY (*)    All other components within normal limits  CBC WITH DIFFERENTIAL/PLATELET    Imaging Review Dg Chest 2 View  12/03/2014  CLINICAL DATA:  FATIGUE, PT states generalized weakness and fatigue x2 weeks with productive greenish sputum cough. PT also c/o bilateral leg aches. PT also states n/v x2 per day for the past 2 weeks, HISTORY OF HYPERLIPIDEMIA, HTN, GERD, SCIATICA, ARTHRITIS EXAM: CHEST  2 VIEW COMPARISON:  08/01/2010. FINDINGS: Cardiac silhouette is normal in size and configuration. Normal mediastinal hilar contours. Clear lungs.  No pleural effusion or pneumothorax. Stable changes  from previous low anterior cervical spine fusion. Bony thorax is intact. IMPRESSION: No active cardiopulmonary disease. Electronically Signed   By: Lajean Manes M.D.   On: 12/03/2014 16:55   I have personally reviewed and evaluated these images and lab results as part of my medical decision-making. 5:30 PM On repeat exam the patient is in no distress. Discussed all findings at length. Patient will follow-up with primary care.  MDM   Final diagnoses:  Influenza-like illness   Patient presents with multiple complaints, essentially fatigue, nausea, vomiting, achiness. Here the patient is awake and alert, afebrile, hemodynamically stable, Mr. questions verbally, with no evidence for acute new systemic illness. Patient improved substantially with fluids,  analgesia. With reassuring findings, no evidence for ongoing acute illness, patient discharged in stable condition to follow-up with primary care.  Carmin Muskrat, MD 12/03/14 [phone]

## 2014-12-03 NOTE — Discharge Instructions (Signed)
As discussed, your evaluation today has been largely reassuring.  But, it is important that you monitor your condition carefully, and do not hesitate to return to the ED if you develop new, or concerning changes in your condition. ? ?Otherwise, please follow-up with your physician for appropriate ongoing care. ? ?

## 2015-04-25 DIAGNOSIS — J4520 Mild intermittent asthma, uncomplicated: Secondary | ICD-10-CM | POA: Insufficient documentation

## 2015-09-13 DIAGNOSIS — M67431 Ganglion, right wrist: Secondary | ICD-10-CM | POA: Insufficient documentation

## 2015-10-20 DIAGNOSIS — Q282 Arteriovenous malformation of cerebral vessels: Secondary | ICD-10-CM

## 2015-10-20 HISTORY — DX: Arteriovenous malformation of cerebral vessels: Q28.2

## 2015-10-22 ENCOUNTER — Emergency Department (HOSPITAL_COMMUNITY)
Admission: EM | Admit: 2015-10-22 | Discharge: 2015-10-22 | Disposition: A | Discharge status: Home / Self Care | Payer: Medicare PPO | Attending: Emergency Medicine | Admitting: Emergency Medicine

## 2015-10-22 ENCOUNTER — Emergency Department (HOSPITAL_COMMUNITY): Payer: Medicare PPO

## 2015-10-22 ENCOUNTER — Encounter (HOSPITAL_COMMUNITY): Payer: Self-pay | Admitting: Emergency Medicine

## 2015-10-22 DIAGNOSIS — G501 Atypical facial pain: Secondary | ICD-10-CM

## 2015-10-22 DIAGNOSIS — J3489 Other specified disorders of nose and nasal sinuses: Secondary | ICD-10-CM | POA: Insufficient documentation

## 2015-10-22 DIAGNOSIS — Z87891 Personal history of nicotine dependence: Secondary | ICD-10-CM | POA: Diagnosis not present

## 2015-10-22 DIAGNOSIS — R0982 Postnasal drip: Secondary | ICD-10-CM | POA: Insufficient documentation

## 2015-10-22 DIAGNOSIS — R0981 Nasal congestion: Secondary | ICD-10-CM | POA: Insufficient documentation

## 2015-10-22 DIAGNOSIS — I10 Essential (primary) hypertension: Secondary | ICD-10-CM | POA: Diagnosis not present

## 2015-10-22 DIAGNOSIS — R51 Headache: Secondary | ICD-10-CM | POA: Diagnosis not present

## 2015-10-22 DIAGNOSIS — R05 Cough: Secondary | ICD-10-CM | POA: Diagnosis not present

## 2015-10-22 DIAGNOSIS — Z7982 Long term (current) use of aspirin: Secondary | ICD-10-CM | POA: Insufficient documentation

## 2015-10-22 LAB — COMPREHENSIVE METABOLIC PANEL
ALT: 21 U/L (ref 14–54)
AST: 22 U/L (ref 15–41)
Albumin: 4 g/dL (ref 3.5–5.0)
Alkaline Phosphatase: 71 U/L (ref 38–126)
Anion gap: 9 (ref 5–15)
BUN: 11 mg/dL (ref 6–20)
CO2: 24 mmol/L (ref 22–32)
Calcium: 9.3 mg/dL (ref 8.9–10.3)
Chloride: 106 mmol/L (ref 101–111)
Creatinine, Ser: 0.77 mg/dL (ref 0.44–1.00)
GFR calc Af Amer: >60 mL/min (ref >60)
GFR calc non Af Amer: >60 mL/min (ref >60)
Glucose, Bld: 108 mg/dL — ABNORMAL HIGH (ref 65–99)
Potassium: 3.5 mmol/L (ref 3.5–5.1)
Sodium: 139 mmol/L (ref 135–145)
Total Bilirubin: 0.4 mg/dL (ref 0.3–1.2)
Total Protein: 6.9 g/dL (ref 6.5–8.1)

## 2015-10-22 LAB — CBC WITH DIFFERENTIAL/PLATELET
Basophils Absolute: 0 10*3/uL (ref 0.0–0.1)
Basophils Relative: 0 %
Eosinophils Absolute: 0.2 10*3/uL (ref 0.0–0.7)
Eosinophils Relative: 2 %
HCT: 42.7 % (ref 36.0–46.0)
Hemoglobin: 13.9 g/dL (ref 12.0–15.0)
Lymphocytes Relative: 32 %
Lymphs Abs: 3.2 10*3/uL (ref 0.7–4.0)
MCH: 28.8 pg (ref 26.0–34.0)
MCHC: 32.6 g/dL (ref 30.0–36.0)
MCV: 88.4 fL (ref 78.0–100.0)
Monocytes Absolute: 0.6 10*3/uL (ref 0.1–1.0)
Monocytes Relative: 6 %
Neutro Abs: 6 10*3/uL (ref 1.7–7.7)
Neutrophils Relative %: 60 %
Platelets: 232 10*3/uL (ref 150–400)
RBC: 4.83 MIL/uL (ref 3.87–5.11)
RDW: 12.8 % (ref 11.5–15.5)
WBC: 10 10*3/uL (ref 4.0–10.5)

## 2015-10-22 LAB — RAPID STREP SCREEN (MED CTR MEBANE ONLY): Streptococcus, Group A Screen (Direct): NEGATIVE

## 2015-10-22 MED ORDER — METOCLOPRAMIDE HCL 5 MG/ML IJ SOLN
10.0000 mg | Freq: Once | INTRAMUSCULAR | Status: AC
  Administered 2015-10-22: 10 mg via INTRAVENOUS
  Filled 2015-10-22: qty 2

## 2015-10-22 MED ORDER — ROPINIROLE HCL 1 MG PO TABS
2.0000 mg | ORAL_TABLET | Freq: Once | ORAL | Status: DC
  Filled 2015-10-22: qty 2

## 2015-10-22 MED ORDER — DIPHENHYDRAMINE HCL 50 MG/ML IJ SOLN
25.0000 mg | Freq: Once | INTRAMUSCULAR | Status: AC
  Administered 2015-10-22: 25 mg via INTRAVENOUS
  Filled 2015-10-22: qty 1

## 2015-10-22 MED ORDER — METHYLPREDNISOLONE 4 MG PO TBPK: ORAL_TABLET | ORAL | 0 refills | Status: DC

## 2015-10-22 MED ORDER — ROPINIROLE HCL 1 MG PO TABS
ORAL_TABLET | ORAL | Status: AC
  Filled 2015-10-22: qty 2

## 2015-10-22 NOTE — ED Provider Notes (Signed)
[facility] DEPT Provider Note   CSN: [id number] Arrival date & time: 10/22/15  1613     History   Chief Complaint Chief Complaint  Patient presents with  . Headache    HPI Renee Gutierrez is a 55 y.o. female.  Patient presents with 2 weeks of burning pain to the left side of her face associated with runny nose and cough. States she's been treated for sinusitis by her primary physician and finished a two-week course of Ceftin air without relief. She denies fever. She returns today with worsening burning pain in the left side of her face is making difficult for her to sleep and she is not able to wear her C Pap. Denies any focal weakness, numbness or tingling. Denies any chest pain or shortness of breath. Denies any vision change. Denies any sore throat or pain with swallowing. Denies any neck pain or back pain. Patient with history of adrenal tumor, myalgia, hypertension and migraines. She states this headache is different than a migraine and feels more like a facial pain. This was gradual in onset and not thunderclap.    Headache   Pertinent negatives include no fever, no nausea and no vomiting.    Past Medical History:  Diagnosis Date  . Anxiety   . Arthritis   . Benign tumor of adrenal gland   . Benign tumor of adrenal gland   . Fibromyalgia   . GERD (gastroesophageal reflux disease)   . Hiatal hernia   . Hyperlipidemia   . Hypertension   . IBS (irritable bowel syndrome)   . Migraines   . S/P Nissen fundoplication (without gastrostomy tube) procedure   . Sciatica     There are no active problems to display for this patient.   Past Surgical History:  Procedure Laterality Date  . ABDOMINAL HYSTERECTOMY    . ABDOMINAL SURGERY     tumor removed  . APPENDECTOMY    . CERVICAL FUSION    . CHOLECYSTECTOMY    . 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 stent to esophagus      OB History    Gravida Para Term Preterm AB Living   2         2    SAB TAB Ectopic Multiple Live Births                   Home Medications    Prior to Admission medications   Medication Sig Start Date End Date Taking? Authorizing Provider  amLODipine (NORVASC) 5 MG tablet Take 5 mg by mouth daily.     Historical Provider, MD  aspirin EC 81 MG tablet Take 81 mg by mouth daily.  09/21/13   Historical Provider, MD  aspirin-acetaminophen-caffeine (EXCEDRIN MIGRAINE) [phone] MG tablet Take 2 tablets by mouth daily as needed. For pain 05/24/14   Historical Provider, MD  cloNIDine (CATAPRES) 0.2 MG tablet Take 0.2 mg by mouth 2 (two) times daily.    Historical Provider, MD  Fluticasone Furoate-Vilanterol (BREO ELLIPTA) 100-25 MCG/INH AEPB Inhale 1 puff into the lungs daily. 04/25/14   Historical Provider, MD  ipratropium-albuterol (DUONEB) 0.5-2.5 (3) MG/3ML SOLN Take 3 mLs by nebulization 4 (four) times daily.    Historical Provider, MD  labetalol (NORMODYNE) 200 MG tablet Take 200 mg by mouth 2 (two) times daily.    Historical Provider, MD  lisinopril (PRINIVIL,ZESTRIL) 40 MG tablet Take 40 mg by mouth daily. 01/31/14 01/31/15  Historical Provider, MD  ondansetron (ZOFRAN) 4 MG tablet Take 4 mg by mouth every 8 (eight) hours as needed.    Historical Provider, MD  Potassium Chloride ER 20 MEQ TBCR Take 20 mEq by mouth daily. 11/23/14   Historical Provider, MD  pravastatin (PRAVACHOL) 40 MG tablet Take 40 mg by mouth at bedtime. 11/28/14 11/28/15  Historical Provider, MD  rOPINIRole (REQUIP) 2 MG tablet Take 2 mg by mouth 2 (two) times daily. 11/11/14   Historical Provider, MD  simethicone (GAS RELIEF EXTRA STRENGTH) 125 MG chewable tablet Chew 125 mg by mouth daily as needed for flatulence.  05/24/14   Historical Provider, MD  topiramate (TOPAMAX) 25 MG tablet Take 50 mg by mouth at bedtime. 11/24/14   Historical Provider, MD  traMADol (ULTRAM) 50 MG tablet Take 50 mg by mouth every 6 (six) hours as needed. pain 02/24/14   Historical Provider, MD    Family  History History reviewed. No pertinent family history.  Social History Social History  Substance Use Topics  . Smoking status: Former Research scientist (life sciences)  . Smokeless tobacco: Not on file  . Alcohol use No     Allergies   Codeine; Cymbalta [duloxetine hcl]; Lyrica [pregabalin]; Neurontin [gabapentin]; and Voltaren [diclofenac]   Review of Systems Review of Systems  Constitutional: Negative for activity change, appetite change, fatigue and fever.  HENT: Positive for congestion, postnasal drip, rhinorrhea and sinus pressure. Negative for facial swelling, sore throat, trouble swallowing and voice change.   Eyes: Negative for photophobia, pain and visual disturbance.  Respiratory: Negative for cough and chest tightness.   Cardiovascular: Negative for chest pain.  Gastrointestinal: Negative for abdominal pain, nausea and vomiting.  Endocrine: Negative for polyuria.  Genitourinary: Negative for dysuria, hematuria, vaginal bleeding and vaginal discharge.  Musculoskeletal: Negative for arthralgias, joint swelling, myalgias and neck stiffness.  Skin: Negative for rash.  Neurological: Positive for headaches. Negative for weakness, light-headedness and numbness.   A complete 10 system review of systems was obtained and all systems are negative except as noted in the HPI and PMH.    Physical Exam Updated Vital Signs BP 155/86 (BP Location: Left Arm)   Pulse 81   Temp 98.1 F (36.7 C) (Oral)   Resp 18   Ht 4\' 11"  (1.499 m)   Wt 170 lb (77.1 kg)   SpO2 93%   BMI 34.34 kg/m   Physical Exam  Constitutional: She is oriented to person, place, and time. She appears well-developed and well-nourished. No distress.  HENT:  Head: Normocephalic and atraumatic.  Right Ear: External ear normal.  Left Ear: External ear normal.  Mouth/Throat: Oropharynx is clear and moist. No oropharyngeal exudate.  No rash TTP to palpation over L forehead and cheek Mouth appears asymmetric, however is normal when she  smiles, tongue is midline  Eyes: Conjunctivae and EOM are normal. Pupils are equal, round, and reactive to light.  Neck: Normal range of motion. Neck supple.  No meningismus.  Cardiovascular: Normal rate, regular rhythm, normal heart sounds and intact distal pulses.   No murmur heard. Pulmonary/Chest: Effort normal and breath sounds normal. No respiratory distress.  Abdominal: Soft. There is no tenderness. There is no rebound and no guarding.  Musculoskeletal: Normal range of motion. She exhibits no edema or tenderness.  Neurological: She is alert and oriented to person, place, and time. A cranial nerve deficit is present. She exhibits normal muscle tone. Coordination normal.  No ataxia on finger to nose bilaterally. No pronator drift. 5/5 strength throughout..Equal grip strength. Sensation intact.  Mouth appears asymmetric, however is normal when she smiles, tongue is midline Slight flattening of R nasolabial fold Symmetric eyebrow raise, symmetric palate elevation  Skin: Skin is warm.  Psychiatric: She has a normal mood and affect. Her behavior is normal.  Nursing note and vitals reviewed.    ED Treatments / Results  Labs (all labs ordered are listed, but only abnormal results are displayed) Labs Reviewed  COMPREHENSIVE METABOLIC PANEL - Abnormal; Notable for the following:       Result Value   Glucose, Bld 108 (*)    All other components within normal limits  RAPID STREP SCREEN (NOT AT [facility])  CULTURE, GROUP A STREP ([employer])  CBC WITH DIFFERENTIAL/PLATELET    EKG  EKG Interpretation None       Radiology Dg Chest 2 View  Result Date: 10/22/2015 CLINICAL DATA:  Cough and headache for 2 weeks. EXAM: CHEST  2 VIEW COMPARISON:  12/03/2014 FINDINGS: The cardiac silhouette, mediastinal and hilar contours are normal and stable. Stable streaky basilar scarring changes. No infiltrates, edema or effusions. The bony thorax is intact. IMPRESSION: Streaky basilar scarring changes but no  infiltrates, edema or effusions. Electronically Signed   By: Marijo Sanes M.D.   On: 10/22/2015 17:30   Ct Head Wo Contrast  Result Date: 10/22/2015 CLINICAL DATA:  Pt reports left sided head pain x2 weeks, has had runny nose with cough, yellow sputum. Pt describes pain in head as "burning." PT alert and oriented. EXAM: CT HEAD WITHOUT CONTRAST CT MAXILLOFACIAL WITHOUT CONTRAST TECHNIQUE: Multidetector CT imaging of the head and maxillofacial structures were performed using the standard protocol without intravenous contrast. Multiplanar CT image reconstructions of the maxillofacial structures were also generated. COMPARISON:  None. FINDINGS: CT HEAD FINDINGS Brain: No evidence of acute infarction, hemorrhage, hydrocephalus, extra-axial collection or mass lesion/mass effect. Vascular: No hyperdense vessel or unexpected calcification. Skull: Normal in appearance. Sinuses/Orbits: Normally aerated sinuses without air-fluid levels identified. Orbits are normal in appearance. Other: None CT MAXILLOFACIAL FINDINGS Orbits: Normal in appearance. Mandible:  Intact.  Temporomandibular joints are normal in position. Nasal bones: Unremarkable. Bony nasal septum is normal in appearance. Zygomatic arches: Normal in appearance. Skull base:  Normal in appearance. Sinuses: Mild mucosal thickening of the left sphenoid air cell and right maxillary sinus. No air-fluid levels or significant mucosal thickening. Visualized cervical spine: Status post fusion of C4-5. IMPRESSION: 1.  No evidence for acute intracranial abnormality. 2. Mild mucosal thickening of the paranasal sinuses as described. No evidence for acute sinusitis. Electronically Signed   By: Nolon Nations M.D.   On: 10/22/2015 17:38   Ct Maxillofacial Wo Contrast  Result Date: 10/22/2015 CLINICAL DATA:  Pt reports left sided head pain x2 weeks, has had runny nose with cough, yellow sputum. Pt describes pain in head as "burning." PT alert and oriented. EXAM: CT HEAD  WITHOUT CONTRAST CT MAXILLOFACIAL WITHOUT CONTRAST TECHNIQUE: Multidetector CT imaging of the head and maxillofacial structures were performed using the standard protocol without intravenous contrast. Multiplanar CT image reconstructions of the maxillofacial structures were also generated. COMPARISON:  None. FINDINGS: CT HEAD FINDINGS Brain: No evidence of acute infarction, hemorrhage, hydrocephalus, extra-axial collection or mass lesion/mass effect. Vascular: No hyperdense vessel or unexpected calcification. Skull: Normal in appearance. Sinuses/Orbits: Normally aerated sinuses without air-fluid levels identified. Orbits are normal in appearance. Other: None CT MAXILLOFACIAL FINDINGS Orbits: Normal in appearance. Mandible:  Intact.  Temporomandibular joints are normal in position. Nasal bones: Unremarkable. Bony nasal septum is normal in appearance. Zygomatic arches:  Normal in appearance. Skull base:  Normal in appearance. Sinuses: Mild mucosal thickening of the left sphenoid air cell and right maxillary sinus. No air-fluid levels or significant mucosal thickening. Visualized cervical spine: Status post fusion of C4-5. IMPRESSION: 1.  No evidence for acute intracranial abnormality. 2. Mild mucosal thickening of the paranasal sinuses as described. No evidence for acute sinusitis. Electronically Signed   By: Nolon Nations M.D.   On: 10/22/2015 17:38    Procedures Procedures (including critical care time)  Medications Ordered in ED Medications  metoCLOPramide (REGLAN) injection 10 mg (not administered)  diphenhydrAMINE (BENADRYL) injection 25 mg (not administered)     Initial Impression / Assessment and Plan / ED Course  I have reviewed the triage vital signs and the nursing notes.  Pertinent labs & imaging results that were available during my care of the patient were reviewed by me and considered in my medical decision making (see chart for details).  Clinical Course   2 weeks of progressively  worsening left facial pain and burning. Patient appears to have asymmetric smile but this resolves when she smiles. She has no focal neurological deficits otherwise.  Patient appears to have burning pain in the left side of her face. There is no rash though zoster is considered. There is no facial droop suggest Bell's palsy.  Patient with continued burning facial pain for the past 2 weeks. She does have nasolabial full flattening on the right and seems to be talking outside of her mouth but there is no facial asymmetry when she smiles.  CT head is negative. Results discussed with Dr. Shon Hale of neurology. He agrees there is no need for emergent MRI today as symptoms have been ongoing for the past 2 weeks and the CT is negative. He suggest this is atypical facial pain does not seem consistent with Bell's palsy, trigeminal neuralgia, or zoster.   Patient does have constant. Patient does have chronic pain issues and does have a pain management specialist who she has appointment within 3 days. Dr. Shon Hale recommends trial of steroids but no need for emergent MRI today.  Follow up with PCP and neurology. Return precautions discussed.   Final Clinical Impressions(s) / ED Diagnoses   Final diagnoses:  Atypical facial pain    New Prescriptions New Prescriptions   No medications on file     Ezequiel Essex, MD 10/22/15 2325

## 2015-10-22 NOTE — ED Notes (Signed)
Patient transported to CT 

## 2015-10-22 NOTE — ED Notes (Signed)
Pt requesting to go now. EDP made aware.  Pt had to ambulate out of room and vital signs unable to obtain.

## 2015-10-22 NOTE — ED Triage Notes (Signed)
Pt reports left sided head pain x2 weeks, has had runny nose with cough, yellow sputum.  Pt describes pain in head as "burning."  PT alert and oriented.

## 2015-10-22 NOTE — Discharge Instructions (Signed)
Take the steroids as prescribed and followup with the neurologist. Return to the ED if you develop new or worsening symptoms.

## 2015-10-25 LAB — CULTURE, GROUP A STREP (THRC)

## 2015-12-29 DIAGNOSIS — I671 Cerebral aneurysm, nonruptured: Secondary | ICD-10-CM | POA: Insufficient documentation

## 2016-02-14 DIAGNOSIS — R040 Epistaxis: Secondary | ICD-10-CM | POA: Insufficient documentation

## 2016-02-17 ENCOUNTER — Emergency Department (HOSPITAL_COMMUNITY)
Admission: EM | Admit: 2016-02-17 | Discharge: 2016-02-17 | Disposition: A | Discharge status: Home / Self Care | Payer: Medicare PPO

## 2016-02-17 NOTE — ED Notes (Signed)
Called for pt, registration tells me that she left after she told me that she was going to

## 2016-02-17 NOTE — ED Notes (Signed)
Pt is upset about wait for triage and notified me that she was leaving.  Notified her that I would be with her next

## 2016-02-18 DIAGNOSIS — G501 Atypical facial pain: Secondary | ICD-10-CM | POA: Insufficient documentation

## 2016-02-18 DIAGNOSIS — I10 Essential (primary) hypertension: Secondary | ICD-10-CM | POA: Insufficient documentation

## 2016-02-18 DIAGNOSIS — I1A0 Resistant hypertension: Secondary | ICD-10-CM | POA: Insufficient documentation

## 2016-02-18 DIAGNOSIS — B309 Viral conjunctivitis, unspecified: Secondary | ICD-10-CM | POA: Insufficient documentation

## 2016-05-18 ENCOUNTER — Emergency Department (HOSPITAL_COMMUNITY)
Admission: EM | Admit: 2016-05-18 | Discharge: 2016-05-18 | Disposition: A | Discharge status: Home / Self Care | Payer: Medicare PPO | Attending: Emergency Medicine | Admitting: Emergency Medicine

## 2016-05-18 ENCOUNTER — Encounter (HOSPITAL_COMMUNITY): Payer: Self-pay | Admitting: *Deleted

## 2016-05-18 DIAGNOSIS — Z79899 Other long term (current) drug therapy: Secondary | ICD-10-CM | POA: Diagnosis not present

## 2016-05-18 DIAGNOSIS — I10 Essential (primary) hypertension: Secondary | ICD-10-CM | POA: Diagnosis not present

## 2016-05-18 DIAGNOSIS — R112 Nausea with vomiting, unspecified: Secondary | ICD-10-CM | POA: Diagnosis not present

## 2016-05-18 DIAGNOSIS — R51 Headache: Secondary | ICD-10-CM | POA: Diagnosis present

## 2016-05-18 DIAGNOSIS — G894 Chronic pain syndrome: Secondary | ICD-10-CM | POA: Insufficient documentation

## 2016-05-18 DIAGNOSIS — Z7982 Long term (current) use of aspirin: Secondary | ICD-10-CM | POA: Insufficient documentation

## 2016-05-18 DIAGNOSIS — R509 Fever, unspecified: Secondary | ICD-10-CM | POA: Diagnosis not present

## 2016-05-18 DIAGNOSIS — F17210 Nicotine dependence, cigarettes, uncomplicated: Secondary | ICD-10-CM | POA: Diagnosis not present

## 2016-05-18 DIAGNOSIS — R630 Anorexia: Secondary | ICD-10-CM | POA: Insufficient documentation

## 2016-05-18 HISTORY — DX: Arteriovenous malformation of cerebral vessels: Q28.2

## 2016-05-18 HISTORY — DX: Injury of trigeminal nerve, unspecified side, initial encounter: S04.30XA

## 2016-05-18 LAB — CBC
HCT: 41.9 % (ref 36.0–46.0)
Hemoglobin: 13.9 g/dL (ref 12.0–15.0)
MCH: 27.9 pg (ref 26.0–34.0)
MCHC: 33.2 g/dL (ref 30.0–36.0)
MCV: 84 fL (ref 78.0–100.0)
Platelets: 236 10*3/uL (ref 150–400)
RBC: 4.99 MIL/uL (ref 3.87–5.11)
RDW: 13.3 % (ref 11.5–15.5)
WBC: 10.1 10*3/uL (ref 4.0–10.5)

## 2016-05-18 LAB — COMPREHENSIVE METABOLIC PANEL
ALT: 12 U/L — ABNORMAL LOW (ref 14–54)
AST: 16 U/L (ref 15–41)
Albumin: 4.4 g/dL (ref 3.5–5.0)
Alkaline Phosphatase: 65 U/L (ref 38–126)
Anion gap: 8 (ref 5–15)
BUN: 10 mg/dL (ref 6–20)
CO2: 27 mmol/L (ref 22–32)
Calcium: 9.7 mg/dL (ref 8.9–10.3)
Chloride: 106 mmol/L (ref 101–111)
Creatinine, Ser: 0.83 mg/dL (ref 0.44–1.00)
GFR calc Af Amer: >60 mL/min (ref >60)
GFR calc non Af Amer: >60 mL/min (ref >60)
Glucose, Bld: 107 mg/dL — ABNORMAL HIGH (ref 65–99)
Potassium: 3.6 mmol/L (ref 3.5–5.1)
Sodium: 141 mmol/L (ref 135–145)
Total Bilirubin: 0.5 mg/dL (ref 0.3–1.2)
Total Protein: 7.3 g/dL (ref 6.5–8.1)

## 2016-05-18 LAB — URINALYSIS, ROUTINE W REFLEX MICROSCOPIC
Bacteria, UA: NONE SEEN
Bilirubin Urine: NEGATIVE
Glucose, UA: NEGATIVE mg/dL
Hgb urine dipstick: NEGATIVE
Ketones, ur: NEGATIVE mg/dL
Leukocytes, UA: NEGATIVE
Nitrite: NEGATIVE
Protein, ur: NEGATIVE mg/dL
Specific Gravity, Urine: 1.02 (ref 1.005–1.030)
pH: 5 (ref 5.0–8.0)

## 2016-05-18 LAB — LIPASE, BLOOD: Lipase: 36 U/L (ref 11–51)

## 2016-05-18 MED ORDER — SODIUM CHLORIDE 0.9 % IV BOLUS (SEPSIS)
1000.0000 mL | Freq: Once | INTRAVENOUS | Status: AC
  Administered 2016-05-18: 1000 mL via INTRAVENOUS

## 2016-05-18 MED ORDER — HYDROMORPHONE HCL 1 MG/ML IJ SOLN
1.0000 mg | INTRAMUSCULAR | Status: DC | PRN
  Administered 2016-05-18: 1 mg via INTRAVENOUS
  Filled 2016-05-18: qty 1

## 2016-05-18 MED ORDER — ONDANSETRON HCL 4 MG/2ML IJ SOLN
4.0000 mg | Freq: Once | INTRAMUSCULAR | Status: AC
  Administered 2016-05-18: 4 mg via INTRAVENOUS
  Filled 2016-05-18: qty 2

## 2016-05-18 NOTE — ED Notes (Signed)
Pt given sprite to drink. 

## 2016-05-18 NOTE — ED Triage Notes (Signed)
Pt c/o left sided facial pain that started this morning after vomiting. 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 Denies abdominal pain. Vomiting x 8, diarrhea x 2 this morning. 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

## 2016-05-18 NOTE — ED Provider Notes (Signed)
[facility] DEPT Provider Note   CSN: [id number] Arrival date & time: 05/18/16  0903   By signing my name below, I, Eunice Blase, attest that this documentation has been prepared under the direction and in the presence of Daleen Bo, MD. Electronically signed, Eunice Blase, ED Scribe. 05/18/16. 12:26 PM.  History   Chief Complaint Chief Complaint  Patient presents with  . Emesis  . Facial Pain   The history is provided by the patient and medical records. No language interpreter was used.    HPI Comments: Renee Gutierrez is a 56 y.o. female who presents to the Emergency Department complaining of L sided facial pain since 01/07/2017. She notes associated vomiting onset this morning, fever (tMax ~100) and decreased appetite. She states her facial pain is d/t AVM malformation procedure that was performed in 12/2015.  Since she began vomiting this morning, she has not been able to keep down her chronic pain medicines, narcotics.  Pt on Bactroban for MRSA. Pt denies diarrhea and suspicious food intake. Possible sick contacts noted with URI symptoms.  There are no other known modifying factors  Past Medical History:  Diagnosis Date  . Anxiety   . Arthritis   . 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 Benign tumor of adrenal gland   . Benign tumor of adrenal gland   . Fibromyalgia   . GERD (gastroesophageal reflux disease)   . Hiatal hernia   . Hyperlipidemia   . Hypertension   . IBS (irritable bowel syndrome)   . Migraines   . S/P Nissen fundoplication (without gastrostomy tube) procedure   . Sciatica   . 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   Past Surgical History:  Procedure Laterality Date  . ABDOMINAL HYSTERECTOMY    . ABDOMINAL SURGERY     tumor removed  . APPENDECTOMY    . BRAIN SURGERY     to fix AVM  . CERVICAL FUSION    . CHOLECYSTECTOMY    . 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 stent to esophagus      OB History    Gravida Para Term Preterm AB Living   2         2   SAB TAB Ectopic Multiple Live Births                   Home Medications    Prior to Admission medications   Medication Sig Start Date End Date Taking? Authorizing Provider  amLODipine (NORVASC) 5 MG tablet Take 5 mg by mouth daily.     Historical Provider, MD  aspirin EC 81 MG tablet Take 81 mg by mouth daily.  09/21/13   Historical Provider, MD  aspirin-acetaminophen-caffeine (EXCEDRIN MIGRAINE) [phone] MG tablet Take 2 tablets by mouth daily as needed. For pain 05/24/14   Historical Provider, MD  cloNIDine (CATAPRES) 0.2 MG tablet Take 0.2 mg by mouth 2 (two) times daily.    Historical Provider, MD  Fluticasone Furoate-Vilanterol (BREO ELLIPTA) 100-25 MCG/INH AEPB Inhale 1 puff into the lungs daily. 04/25/14   Historical Provider, MD  ipratropium-albuterol (DUONEB) 0.5-2.5 (3) MG/3ML SOLN Take 3 mLs by nebulization 4 (four) times daily.    Historical Provider, MD  labetalol (NORMODYNE) 200 MG tablet Take 200 mg by mouth 2 (two) times daily.    Historical Provider, MD  lisinopril (PRINIVIL,ZESTRIL) 40 MG  tablet Take 40 mg by mouth daily. 01/31/14 01/31/15  Historical Provider, MD  methylPREDNISolone (MEDROL DOSEPAK) 4 MG TBPK tablet As directed 10/22/15   Ezequiel Essex, MD  ondansetron (ZOFRAN) 4 MG tablet Take 4 mg by mouth every 8 (eight) hours as needed.    Historical Provider, MD  Potassium Chloride ER 20 MEQ TBCR Take 20 mEq by mouth daily. 11/23/14   Historical Provider, MD  pravastatin (PRAVACHOL) 40 MG tablet Take 40 mg by mouth at bedtime. 11/28/14 11/28/15  Historical Provider, MD  rOPINIRole (REQUIP) 2 MG tablet Take 2 mg by mouth 2 (two) times daily. 11/11/14   Historical Provider, MD  simethicone (GAS RELIEF EXTRA STRENGTH) 125 MG chewable tablet Chew 125 mg by mouth daily as needed for flatulence.  05/24/14   Historical Provider, MD  topiramate  (TOPAMAX) 25 MG tablet Take 50 mg by mouth at bedtime. 11/24/14   Historical Provider, MD  traMADol (ULTRAM) 50 MG tablet Take 50 mg by mouth every 6 (six) hours as needed. pain 02/24/14   Historical Provider, MD    Family History No family history on file.  Social History Social History  Substance Use Topics  . Smoking status: Current Every Day Smoker    Types: Cigarettes  . Smokeless tobacco: Never Used     Comment: 1 cigarette daily   . Alcohol use No     Allergies   Codeine; Cymbalta [duloxetine hcl]; Diclofenac sodium; Lyrica [pregabalin]; Neurontin [gabapentin]; Voltaren [diclofenac]; Clindamycin; and Toradol [ketorolac tromethamine]   Review of Systems Review of Systems  Constitutional: Positive for appetite change and fever.  HENT: Positive for facial swelling.   Gastrointestinal: Positive for nausea and vomiting. Negative for diarrhea.  All other systems reviewed and are negative.    Physical Exam Updated Vital Signs BP (!) 166/100 (BP Location: Left Arm)   Pulse 94   Temp 98.2 F (36.8 C) (Oral)   Resp 20   Ht 4\' 11"  (1.499 m)   Wt 153 lb (69.4 kg)   SpO2 99%   BMI 30.90 kg/m   Physical Exam  Constitutional: She is oriented to person, place, and time. She appears well-developed and well-nourished. She appears distressed.  Tearful, upset  HENT:  Head: Normocephalic and atraumatic.  L nare is erythematous with small ulcer laterally, no bleeding. No trismus.  Eyes: Conjunctivae and EOM are normal. Pupils are equal, round, and reactive to light.  Neck: Normal range of motion and phonation normal. Neck supple.  Cardiovascular: Normal rate and regular rhythm.   Pulmonary/Chest: Effort normal and breath sounds normal. She exhibits no tenderness.  Abdominal: Soft. Bowel sounds are normal. She exhibits no distension. There is no tenderness. There is no guarding.  Musculoskeletal: Normal range of motion.  Neurological: She is alert and oriented to person, place,  and time. She exhibits normal muscle tone.  Skin: Skin is warm and dry.  Psychiatric: She has a normal mood and affect. Her behavior is normal. Judgment and thought content normal.  Nursing note and vitals reviewed.    ED Treatments / Results  DIAGNOSTIC STUDIES: Oxygen Saturation is 99% on RA, normal by my interpretation.    COORDINATION OF CARE: 12:20 PM Discussed treatment plan with pt at bedside and pt agreed to plan. Will order fluids and medication.  Labs (all labs ordered are listed, but only abnormal results are displayed) Labs Reviewed  COMPREHENSIVE METABOLIC PANEL - Abnormal; Notable for the following:       Result Value   Glucose, Bld  107 (*)    ALT 12 (*)    All other components within normal limits  URINALYSIS, ROUTINE W REFLEX MICROSCOPIC - Abnormal; Notable for the following:    Squamous Epithelial / LPF 6-30 (*)    All other components within normal limits  LIPASE, BLOOD  CBC    EKG  EKG Interpretation None       Radiology No results found.  Procedures Procedures (including critical care time)  Medications Ordered in ED Medications  HYDROmorphone (DILAUDID) injection 1 mg (1 mg Intravenous Given 05/18/16 1310)  sodium chloride 0.9 % bolus 1,000 mL (0 mLs Intravenous Stopped 05/18/16 1312)  ondansetron (ZOFRAN) injection 4 mg (4 mg Intravenous Given 05/18/16 1157)     Initial Impression / Assessment and Plan / ED Course  I have reviewed the triage vital signs and the nursing notes.  Pertinent labs & imaging results that were available during my care of the patient were reviewed by me and considered in my medical decision making (see chart for details).  Clinical Course as of May 18 1408  Sat May 18, 2016  1238 Patient is requesting pain medicine for her chronic face pain.  According to the [place] PMP aware site, she receives chronic narcotic analgesia and 2 different types of benzodiazepines; in the following amounts.  Oxycodone 10 mg #120,  clonazepam 0.25 mg #60 and lorazepam 0.5 mg #60, each month the Klonopin and lorazepam are prescribed by different providers.  Additionally in March she received 60 XTampza ER tablets from the provider who prescribed the oxycodone.  Also within the last 2 months she has received large prescriptions for Nucynta, and hydromorphone.  [EW]    Clinical Course User Index [EW] Daleen Bo, MD    Medications  HYDROmorphone (DILAUDID) injection 1 mg (1 mg Intravenous Given 05/18/16 1310)  sodium chloride 0.9 % bolus 1,000 mL (0 mLs Intravenous Stopped 05/18/16 1312)  ondansetron (ZOFRAN) injection 4 mg (4 mg Intravenous Given 05/18/16 1157)    Patient Vitals for the past 24 hrs:  BP Temp Temp src Pulse Resp SpO2 Height Weight  05/18/16 1330 (!) 169/93 - - 72 - 95 % - -  05/18/16 1300 (!) 160/88 - - 80 - 98 % - -  05/18/16 1200 (!) 198/102 - - 79 - 97 % - -  05/18/16 0927 - - - - - - 4\' 11"  (1.499 m) 153 lb (69.4 kg)  05/18/16 0926 (!) 166/100 98.2 F (36.8 C) Oral 94 20 99 % - -    2:10 PM Reevaluation with update and discussion. After initial assessment and treatment, an updated evaluation reveals patient is comfortable while sitting up hair brushed and calm.  She feels ready to go home.  Findings discussed with the patient and all questions were answered. Fletcher Rathbun L    Final Clinical Impressions(s) / ED Diagnoses   Final diagnoses:  Chronic pain syndrome   Estimation chronic pain with nonspecific vomiting, controlled with IV fluids, and antiemetic in the ED.  Patient states that she has Zofran to use at home.  Doubt suspect infection metabolic instability or impending vascular collapse.  Ongoing chronic facial discomfort secondary to postoperative complication, by her report.  Nursing Notes Reviewed/ Care Coordinated Applicable Imaging Reviewed Interpretation of Laboratory Data incorporated into ED treatment  The patient appears reasonably screened and/or stabilized for discharge and  I doubt any other medical condition or other [facility] requiring further screening, evaluation, or treatment in the ED at this time prior to discharge.  Plan: Home Medications-continue current usual medications.; Home Treatments-rest; return here if the recommended treatment, does not improve the symptoms; Recommended follow up-PCP, and pain management doctor, as needed   New Prescriptions New Prescriptions   No medications on file   I personally performed the services described in this documentation, which was scribed in my presence. The recorded information has been reviewed and is accurate.     Daleen Bo, MD 05/18/16 [phone]

## 2016-07-21 ENCOUNTER — Encounter (HOSPITAL_COMMUNITY): Payer: Self-pay | Admitting: Emergency Medicine

## 2016-07-21 ENCOUNTER — Emergency Department (HOSPITAL_COMMUNITY)
Admission: EM | Admit: 2016-07-21 | Discharge: 2016-07-21 | Disposition: A | Discharge status: Home / Self Care | Payer: Medicare PPO | Attending: Emergency Medicine | Admitting: Emergency Medicine

## 2016-07-21 DIAGNOSIS — G500 Trigeminal neuralgia: Secondary | ICD-10-CM

## 2016-07-21 DIAGNOSIS — Z79899 Other long term (current) drug therapy: Secondary | ICD-10-CM | POA: Diagnosis not present

## 2016-07-21 DIAGNOSIS — R51 Headache: Secondary | ICD-10-CM | POA: Diagnosis present

## 2016-07-21 DIAGNOSIS — Z7982 Long term (current) use of aspirin: Secondary | ICD-10-CM | POA: Diagnosis not present

## 2016-07-21 DIAGNOSIS — F17210 Nicotine dependence, cigarettes, uncomplicated: Secondary | ICD-10-CM | POA: Diagnosis not present

## 2016-07-21 DIAGNOSIS — I10 Essential (primary) hypertension: Secondary | ICD-10-CM | POA: Insufficient documentation

## 2016-07-21 MED ORDER — FENTANYL CITRATE (PF) 100 MCG/2ML IJ SOLN
50.0000 ug | Freq: Once | INTRAMUSCULAR | Status: AC
  Administered 2016-07-21: 50 ug via INTRAMUSCULAR
  Filled 2016-07-21: qty 2

## 2016-07-21 MED ORDER — PROMETHAZINE HCL 25 MG/ML IJ SOLN
25.0000 mg | Freq: Once | INTRAMUSCULAR | Status: AC
  Administered 2016-07-21: 25 mg via INTRAMUSCULAR
  Filled 2016-07-21: qty 1

## 2016-07-21 MED ORDER — PROMETHAZINE HCL 25 MG PO TABS: 25.0000 mg | ORAL_TABLET | Freq: Four times a day (QID) | ORAL | 0 refills | Status: DC | PRN

## 2016-07-21 NOTE — ED Triage Notes (Addendum)
Patient c/o face pain, nausea, and vomiting. Per patient pain x6 months. Patients hx hard to follow. Patient states that she had Brain surgery at [facility] and "glue was left on the brain." Patient then states going to ENT doctor because she had MRSA in her nose. Patient states took a biopsy of nose and had a tumor. Per patient pain, nausea, and vomiting related to all of these events. Patient reports being given medication for pain but unsure of name and states she stopped taking it because it didn't help. Patient states "The vomiting has gotten worse. I can't eat, I can't sleep."

## 2016-07-21 NOTE — ED Notes (Signed)
Pt here from [place], reports brain surgery for AV malformation at [facility] and also follow at [facility] reports that she has had terrible psin since November  That her [facility] physician missed an appointment that he scheduled for her, and that she now has trigeminal neuralgia

## 2016-07-21 NOTE — ED Provider Notes (Signed)
[facility] DEPT Provider Note   CSN: [id number] Arrival date & time: 07/21/16  1747     History   Chief Complaint Chief Complaint  Patient presents with  . Facial Pain    HPI Renee Gutierrez is a 56 y.o. female.  HPI  The patient is a 56 year old female with a history of an arteriovenous malformation of the brain who underwent brain surgery and since that time his suffered with left sided trigeminal neuralgia. She has had almost constant pain, she has been seen at multiple specialty clinics including ear nose and throat, neurosurgery and is currently controlled at a pain clinic however she continues to have left-sided facial pain with even light touch. This prevents her from getting any sleep. She has tried multiple different medications, she is requesting something to control the acute pain. She endorses being in a pain clinic and knows that she cannot receive any prescriptions. She has tried the traditional medications for trigeminal neuralgia without relief in the past  Past Medical History:  Diagnosis Date  . Anxiety   . Arthritis   . 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 Benign tumor of adrenal gland   . Benign tumor of adrenal gland   . Fibromyalgia   . GERD (gastroesophageal reflux disease)   . Hiatal hernia   . Hyperlipidemia   . Hypertension   . IBS (irritable bowel syndrome)   . Migraines   . S/P Nissen fundoplication (without gastrostomy tube) procedure   . Sciatica   . 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   Past Surgical History:  Procedure Laterality Date  . ABDOMINAL HYSTERECTOMY    . ABDOMINAL SURGERY     tumor removed  . APPENDECTOMY    . BRAIN SURGERY     to fix AVM  . CERVICAL FUSION    . CHOLECYSTECTOMY    . 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 stent to esophagus      OB History    Gravida Para Term Preterm AB Living     2         2   SAB TAB Ectopic Multiple Live Births                   Home Medications    Prior to Admission medications   Medication Sig Start Date End Date Taking? Authorizing Provider  amLODipine (NORVASC) 5 MG tablet Take 5 mg by mouth daily.     [provider]  aspirin EC 81 MG tablet Take 81 mg by mouth daily.  09/21/13   [provider]  aspirin-acetaminophen-caffeine (EXCEDRIN MIGRAINE) [phone] MG tablet Take 2 tablets by mouth daily as needed. For pain 05/24/14   [provider]  cloNIDine (CATAPRES) 0.2 MG tablet Take 0.2 mg by mouth 2 (two) times daily.    [provider]  Fluticasone Furoate-Vilanterol (BREO ELLIPTA) 100-25 MCG/INH AEPB Inhale 1 puff into the lungs daily. 04/25/14   [provider]  ipratropium-albuterol (DUONEB) 0.5-2.5 (3) MG/3ML SOLN Take 3 mLs by nebulization 4 (four) times daily.    [provider]  labetalol (NORMODYNE) 200 MG tablet Take 200 mg by mouth 2 (two) times daily.    [provider]  lisinopril (PRINIVIL,ZESTRIL) 40 MG tablet Take 40 mg by mouth daily. 01/31/14 01/31/15  [provider]  methylPREDNISolone (MEDROL DOSEPAK) 4  MG TBPK tablet As directed 10/22/15   Rancour, Annie Main, MD  ondansetron (ZOFRAN) 4 MG tablet Take 4 mg by mouth every 8 (eight) hours as needed.    [provider]  Potassium Chloride ER 20 MEQ TBCR Take 20 mEq by mouth daily. 11/23/14   [provider]  pravastatin (PRAVACHOL) 40 MG tablet Take 40 mg by mouth at bedtime. 11/28/14 11/28/15  [provider]  promethazine (PHENERGAN) 25 MG tablet Take 1 tablet (25 mg total) by mouth every 6 (six) hours as needed for nausea or vomiting. 07/21/16   Noemi Chapel, MD  rOPINIRole (REQUIP) 2 MG tablet Take 2 mg by mouth 2 (two) times daily. 11/11/14   [provider]  simethicone (GAS RELIEF EXTRA STRENGTH) 125 MG chewable tablet Chew 125 mg by mouth daily as needed for flatulence.   05/24/14   [provider]  topiramate (TOPAMAX) 25 MG tablet Take 50 mg by mouth at bedtime. 11/24/14   [provider]  traMADol (ULTRAM) 50 MG tablet Take 50 mg by mouth every 6 (six) hours as needed. pain 02/24/14   [provider]    Family History History reviewed. No pertinent family history.  Social History Social History  Substance Use Topics  . Smoking status: Current Every Day Smoker    Types: Cigarettes  . Smokeless tobacco: Never Used     Comment: 1 cigarette daily   . Alcohol use No     Allergies   Codeine; Cymbalta [duloxetine hcl]; Diclofenac sodium; Lyrica [pregabalin]; Neurontin [gabapentin]; Voltaren [diclofenac]; Clindamycin; and Toradol [ketorolac tromethamine]   Review of Systems Review of Systems  All other systems reviewed and are negative.    Physical Exam Updated Vital Signs BP (!) 191/105   Pulse 75   Temp 98.4 F (36.9 C) (Oral)   Resp 20   Ht 4\' 11"  (1.499 m)   Wt 66.2 kg (146 lb)   SpO2 98%   BMI 29.49 kg/m   Physical Exam  Constitutional: She appears well-developed and well-nourished. No distress.  HENT:  Head: Normocephalic and atraumatic.  Mouth/Throat: Oropharynx is clear and moist. No oropharyngeal exudate.  No trismus or torticollis, oropharynx clear and moist, left-sided the face is tender to even light touch.  Eyes: Conjunctivae and EOM are normal. Pupils are equal, round, and reactive to light. Right eye exhibits no discharge. Left eye exhibits no discharge. No scleral icterus.  Neck: Normal range of motion. Neck supple. No JVD present. No thyromegaly present.  Cardiovascular: Normal rate, regular rhythm, normal heart sounds and intact distal pulses.  Exam reveals no gallop and no friction rub.   No murmur heard. Pulmonary/Chest: Effort normal and breath sounds normal. No respiratory distress. She has no wheezes. She has no rales.  Abdominal: Soft. Bowel sounds are normal. She exhibits no distension and  no mass. There is no tenderness.  Musculoskeletal: Normal range of motion. She exhibits no edema or tenderness.  Lymphadenopathy:    She has no cervical adenopathy.  Neurological: She is alert. Coordination normal.  Skin: Skin is warm and dry. No rash noted. No erythema.  Psychiatric: She has a normal mood and affect. Her behavior is normal.  Nursing note and vitals reviewed.    ED Treatments / Results  Labs (all labs ordered are listed, but only abnormal results are displayed) Labs Reviewed - No data to display   Radiology No results found.  Procedures Procedures (including critical care time)  Medications Ordered in ED Medications  fentaNYL (SUBLIMAZE)  injection 50 mcg (not administered)  promethazine (PHENERGAN) injection 25 mg (not administered)     Initial Impression / Assessment and Plan / ED Course  I have reviewed the triage vital signs and the nursing notes.  Pertinent labs & imaging results that were available during my care of the patient were reviewed by me and considered in my medical decision making (see chart for details).     The patient unfortunately has significant resistance trigeminal neuralgia, she will get a single dose of medication as well as antibiotics as she is nauseated and I have told her she can have a prescription for the Phenergan as well, she is in a pain clinic and will need follow-up for ongoing pain control.  Stable for d/c.  Final Clinical Impressions(s) / ED Diagnoses   Final diagnoses:  Trigeminal neuralgia    New Prescriptions New Prescriptions   PROMETHAZINE (PHENERGAN) 25 MG TABLET    Take 1 tablet (25 mg total) by mouth every 6 (six) hours as needed for nausea or vomiting.     Noemi Chapel, MD 07/21/16 [phone]

## 2016-07-21 NOTE — ED Triage Notes (Signed)
Asked pt if any new symptoms and pt shook her head 'no'.

## 2016-07-21 NOTE — Discharge Instructions (Signed)

## 2016-07-31 DIAGNOSIS — G500 Trigeminal neuralgia: Secondary | ICD-10-CM | POA: Insufficient documentation

## 2016-09-28 IMAGING — CT CT HEAD W/O CM
3 of 8 series · 16 of 47 positions shown, 19 images · non-contrast
Comparison: None.

CLINICAL DATA: Pt reports left sided head pain x2 weeks, has had
runny nose with cough, yellow sputum. Pt describes pain in head as
"burning." PT alert and oriented.

EXAM:
CT HEAD WITHOUT CONTRAST
CT MAXILLOFACIAL WITHOUT CONTRAST
TECHNIQUE: Multidetector CT imaging of the head and maxillofacial structures
were performed using the standard protocol without intravenous
contrast. Multiplanar CT image reconstructions of the maxillofacial
structures were also generated.

[Series 4: coronal · coronal · 0.27mm/px · 2 of 61 slices shown]
[im 28/61  brain]
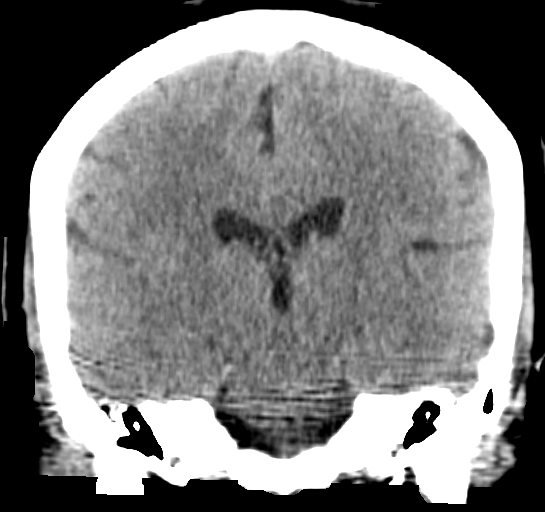
[im 56/61  brain]
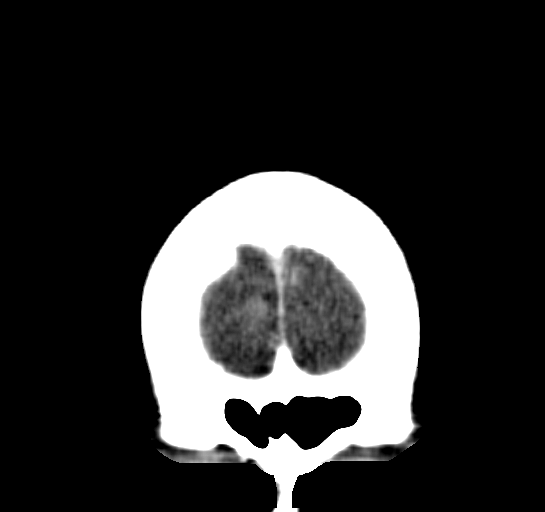

[Series 6: max soft · axial · 0.33mm/px · z∈[+41,+173]mm · 12 of 78 slices shown, 15 images]
[im 6/78  brain]
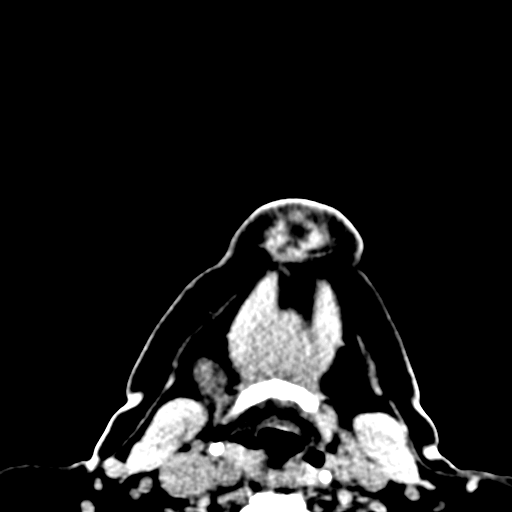
[im 6/78  bone]
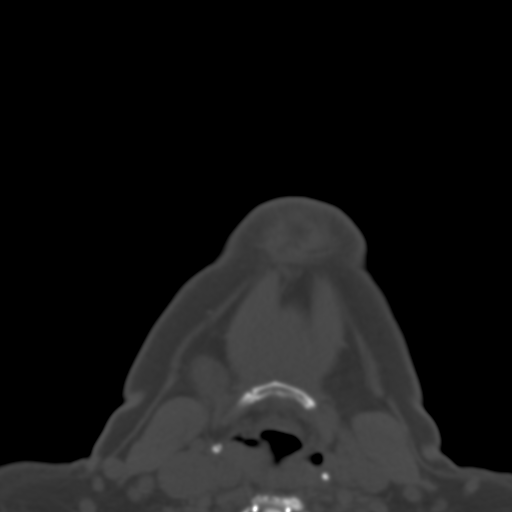
[im 11/78  brain]
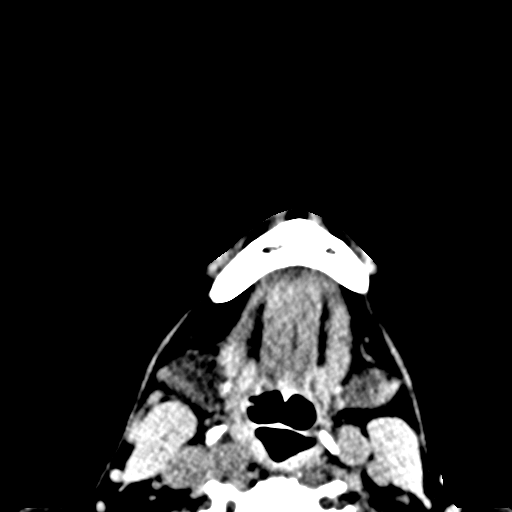
[im 16/78  brain]
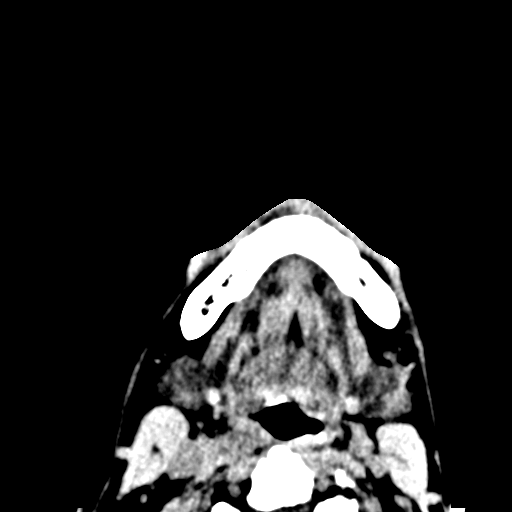
[im 26/78  brain]
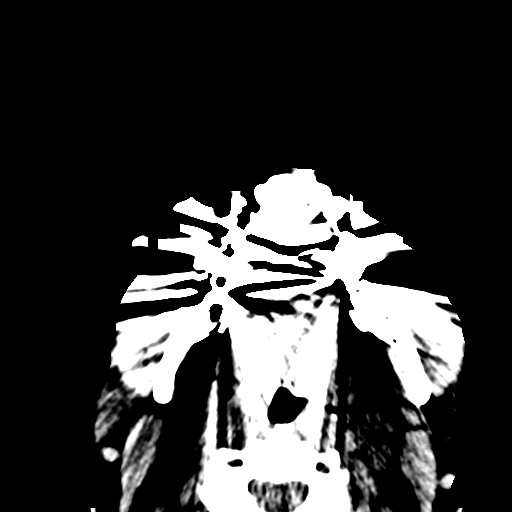
[im 31/78  brain]
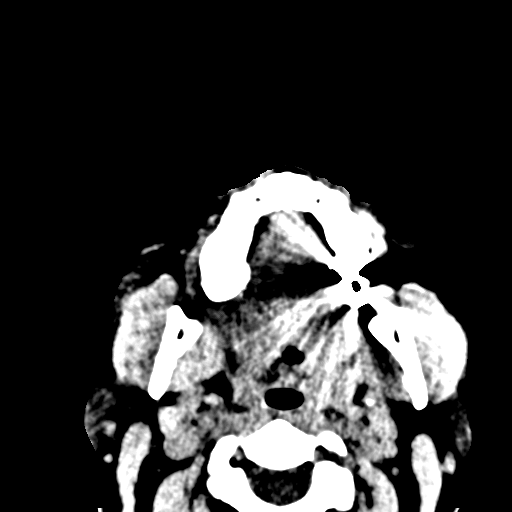
[im 31/78  bone]
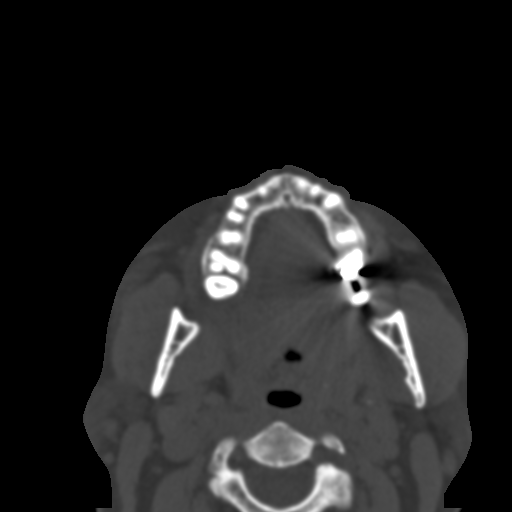
[im 36/78  brain]
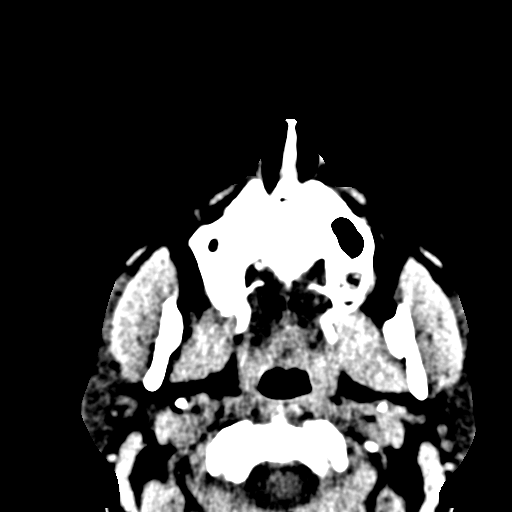
[im 42/78  brain]
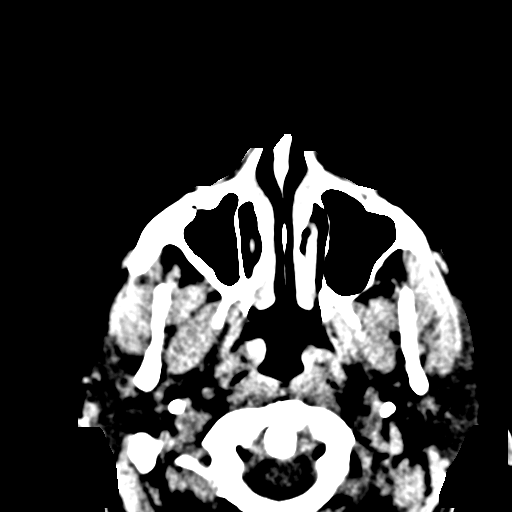
[im 47/78  brain]
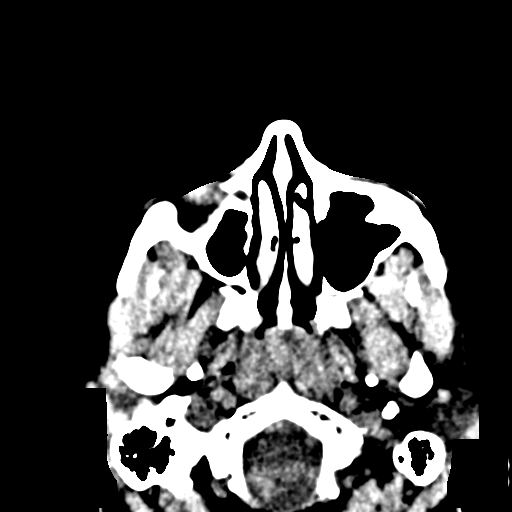
[im 52/78  brain]
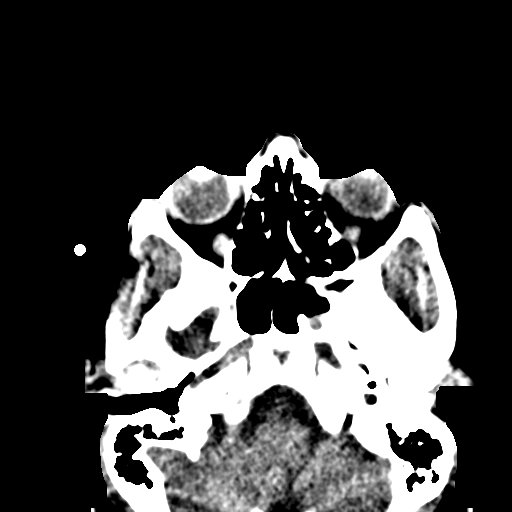
[im 52/78  bone]
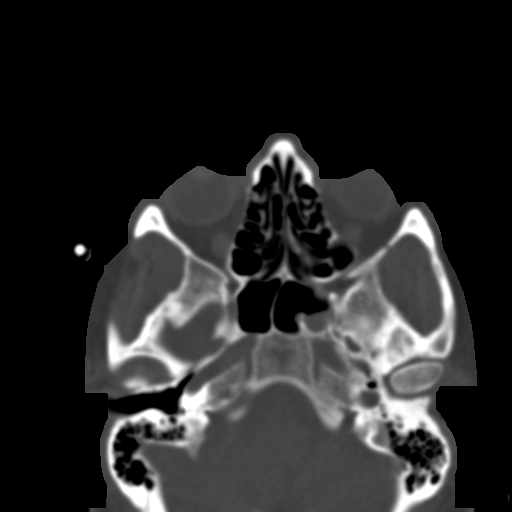
[im 62/78  brain]
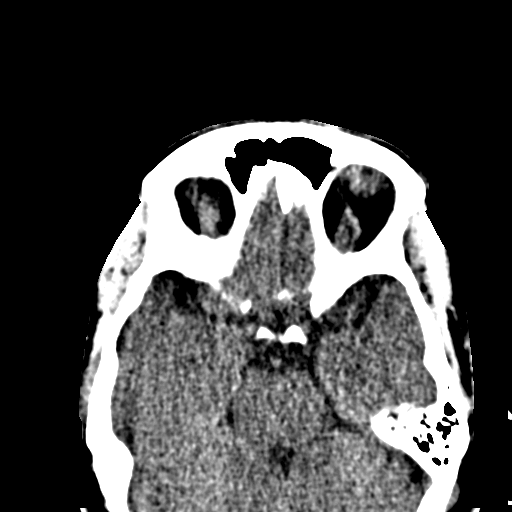
[im 67/78  brain]
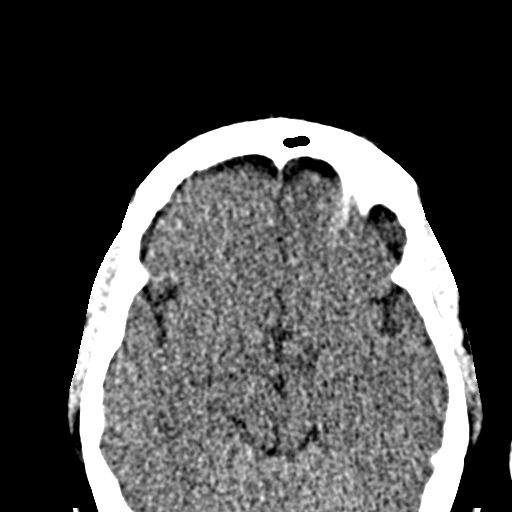
[im 72/78  brain]
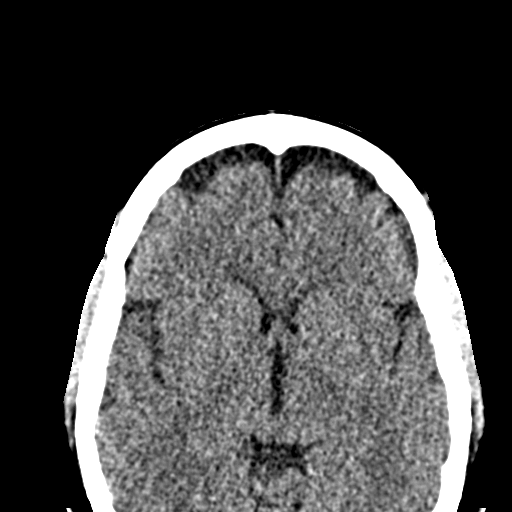

[Series 9: sagittal soft · sagittal · 0.28mm/px · 2 of 70 slices shown]
[im 24/70  brain]
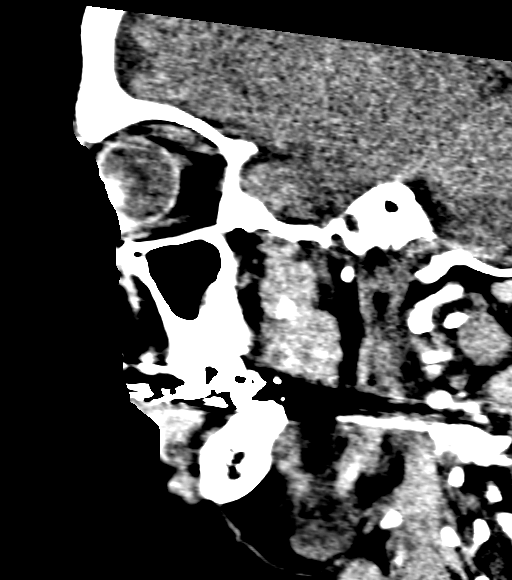
[im 47/70  brain]
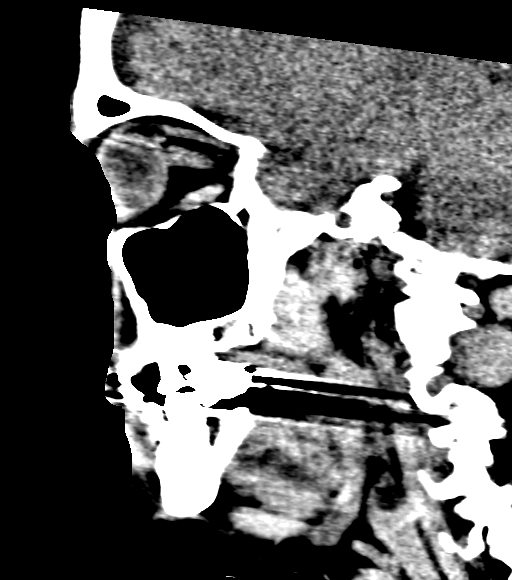

[16 of 47 positions shown; findings below may reference images not displayed]

FINDINGS: CT HEAD FINDINGS

Brain: No evidence of acute infarction, hemorrhage, hydrocephalus,
extra-axial collection or mass lesion/mass effect.

Vascular: No hyperdense vessel or unexpected calcification.

Skull: Normal in appearance.

Sinuses/Orbits: Normally aerated sinuses without air-fluid levels
identified. Orbits are normal in appearance.

Other: None

CT MAXILLOFACIAL FINDINGS

Orbits: Normal in appearance.

Mandible:  Intact.  Temporomandibular joints are normal in position.

Nasal bones: Unremarkable. Bony nasal septum is normal in
appearance.

Zygomatic arches: Normal in appearance.

Skull base:  Normal in appearance.

Sinuses: Mild mucosal thickening of the left sphenoid air cell and
right maxillary sinus. No air-fluid levels or significant mucosal
thickening.

Visualized cervical spine: Status post fusion of C4-5.
IMPRESSION: 1.  No evidence for acute intracranial abnormality.
2. Mild mucosal thickening of the paranasal sinuses as described. No
evidence for acute sinusitis.

## 2016-09-28 IMAGING — DX DG CHEST 2V
2 series · 2 of 2 positions shown · non-contrast
Comparison: 12/03/2014

CLINICAL DATA: Cough and headache for 2 weeks.

EXAM:
CHEST  2 VIEW

[chest pa]
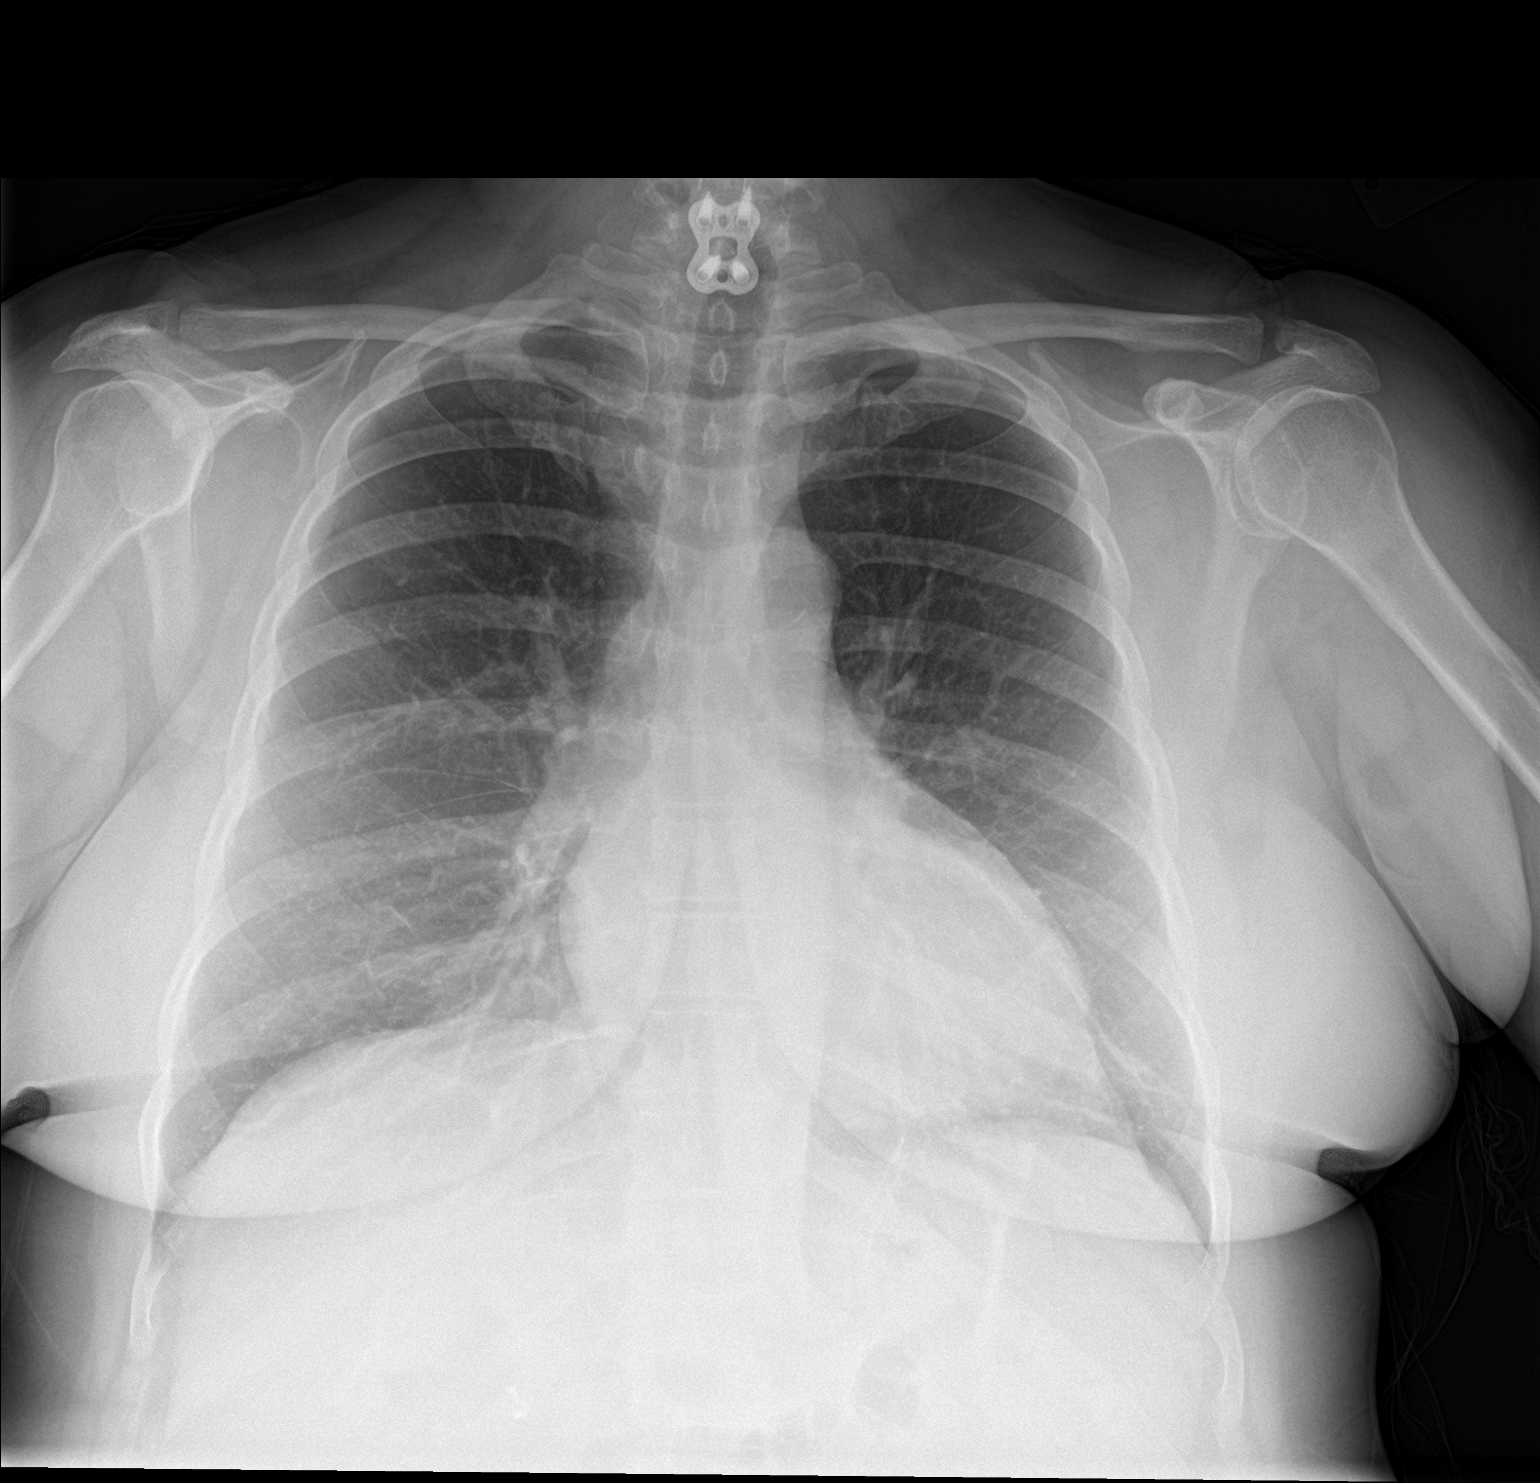

[chest lat]
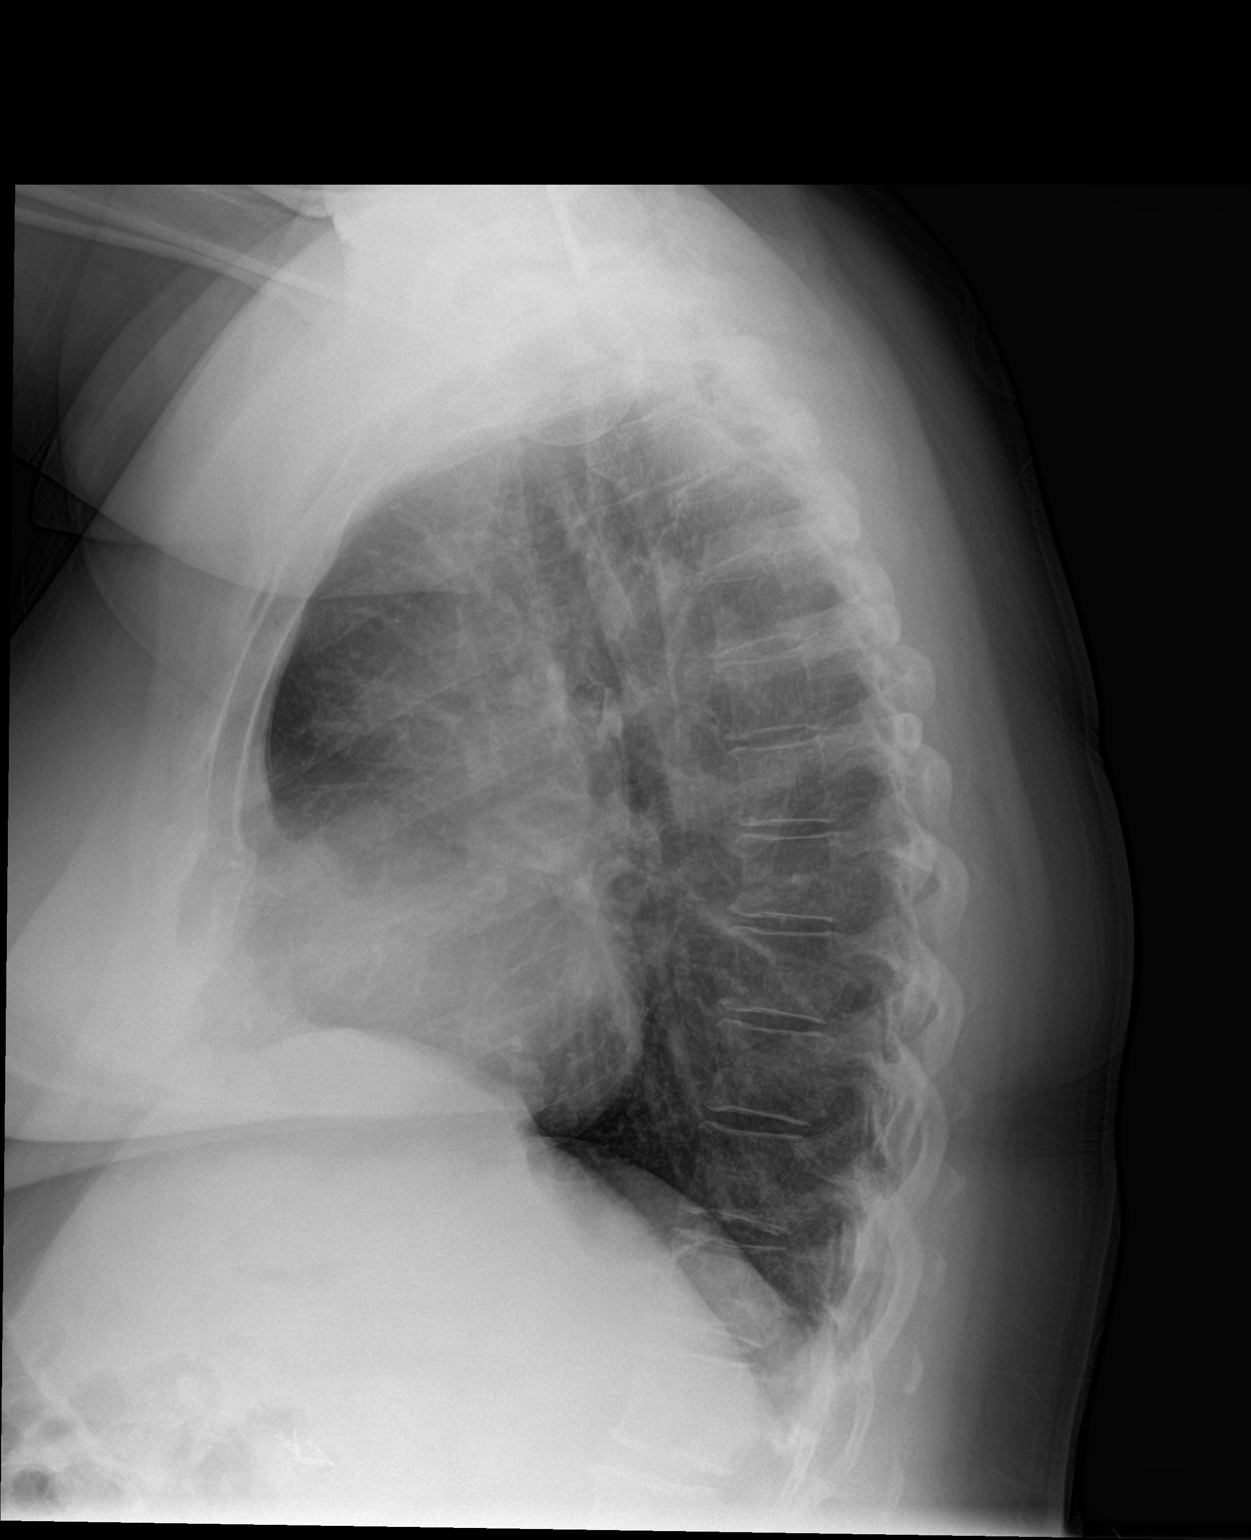

[2 of 2 positions shown; findings below may reference images not displayed]

FINDINGS: The cardiac silhouette, mediastinal and hilar contours are normal
and stable. Stable streaky basilar scarring changes. No infiltrates,
edema or effusions. The bony thorax is intact.
IMPRESSION: Streaky basilar scarring changes but no infiltrates, edema or
effusions.

## 2016-12-12 DIAGNOSIS — E270 Other adrenocortical overactivity: Secondary | ICD-10-CM | POA: Insufficient documentation

## 2016-12-12 DIAGNOSIS — R7989 Other specified abnormal findings of blood chemistry: Secondary | ICD-10-CM | POA: Insufficient documentation

## 2018-08-23 ENCOUNTER — Encounter (HOSPITAL_COMMUNITY): Payer: Self-pay | Admitting: *Deleted

## 2018-08-23 ENCOUNTER — Emergency Department (HOSPITAL_COMMUNITY)
Admission: EM | Admit: 2018-08-23 | Discharge: 2018-08-23 | Disposition: A | Discharge status: Home / Self Care | Payer: Medicare PPO | Attending: Emergency Medicine | Admitting: Emergency Medicine

## 2018-08-23 ENCOUNTER — Other Ambulatory Visit: Payer: Self-pay

## 2018-08-23 DIAGNOSIS — G501 Atypical facial pain: Secondary | ICD-10-CM | POA: Insufficient documentation

## 2018-08-23 DIAGNOSIS — M79605 Pain in left leg: Secondary | ICD-10-CM | POA: Diagnosis not present

## 2018-08-23 DIAGNOSIS — I10 Essential (primary) hypertension: Secondary | ICD-10-CM | POA: Insufficient documentation

## 2018-08-23 DIAGNOSIS — Z5321 Procedure and treatment not carried out due to patient leaving prior to being seen by health care provider: Secondary | ICD-10-CM | POA: Insufficient documentation

## 2018-08-23 DIAGNOSIS — R200 Anesthesia of skin: Secondary | ICD-10-CM | POA: Diagnosis not present

## 2018-08-23 NOTE — ED Triage Notes (Signed)
Pt c/o having high blood pressure readings and feeling weak x one day; pt c/o pain to the left side of her face and left leg; pt c/o having numbness to her left knee

## 2018-08-23 NOTE — ED Notes (Signed)
Pt c/o wait time stating she is not sure if she can wait that long; encouraged pt to stay

## 2018-11-19 DIAGNOSIS — Z79891 Long term (current) use of opiate analgesic: Secondary | ICD-10-CM | POA: Insufficient documentation

## 2018-12-15 ENCOUNTER — Emergency Department (HOSPITAL_COMMUNITY)
Admission: EM | Admit: 2018-12-15 | Discharge: 2018-12-15 | Disposition: A | Discharge status: Home / Self Care | Payer: Medicare PPO

## 2018-12-15 ENCOUNTER — Other Ambulatory Visit: Payer: Self-pay

## 2020-05-17 DIAGNOSIS — J449 Chronic obstructive pulmonary disease, unspecified: Secondary | ICD-10-CM | POA: Diagnosis present

## 2020-05-30 ENCOUNTER — Emergency Department (HOSPITAL_COMMUNITY)
Admission: EM | Admit: 2020-05-30 | Discharge: 2020-05-31 | Disposition: A | Discharge status: Home / Self Care | Payer: Medicare PPO | Attending: Emergency Medicine | Admitting: Emergency Medicine

## 2020-05-30 ENCOUNTER — Other Ambulatory Visit: Payer: Self-pay

## 2020-05-30 ENCOUNTER — Encounter (HOSPITAL_COMMUNITY): Payer: Self-pay | Admitting: Emergency Medicine

## 2020-05-30 DIAGNOSIS — Z79899 Other long term (current) drug therapy: Secondary | ICD-10-CM | POA: Diagnosis not present

## 2020-05-30 DIAGNOSIS — G2581 Restless legs syndrome: Secondary | ICD-10-CM | POA: Insufficient documentation

## 2020-05-30 DIAGNOSIS — G43909 Migraine, unspecified, not intractable, without status migrainosus: Secondary | ICD-10-CM | POA: Insufficient documentation

## 2020-05-30 DIAGNOSIS — G8929 Other chronic pain: Secondary | ICD-10-CM | POA: Insufficient documentation

## 2020-05-30 DIAGNOSIS — R519 Headache, unspecified: Secondary | ICD-10-CM | POA: Diagnosis present

## 2020-05-30 DIAGNOSIS — Z8719 Personal history of other diseases of the digestive system: Secondary | ICD-10-CM | POA: Insufficient documentation

## 2020-05-30 DIAGNOSIS — K561 Intussusception: Secondary | ICD-10-CM | POA: Insufficient documentation

## 2020-05-30 DIAGNOSIS — R5383 Other fatigue: Secondary | ICD-10-CM | POA: Insufficient documentation

## 2020-05-30 DIAGNOSIS — J340 Abscess, furuncle and carbuncle of nose: Secondary | ICD-10-CM | POA: Insufficient documentation

## 2020-05-30 DIAGNOSIS — M25559 Pain in unspecified hip: Secondary | ICD-10-CM | POA: Insufficient documentation

## 2020-05-30 DIAGNOSIS — E785 Hyperlipidemia, unspecified: Secondary | ICD-10-CM | POA: Insufficient documentation

## 2020-05-30 DIAGNOSIS — Z0000 Encounter for general adult medical examination without abnormal findings: Secondary | ICD-10-CM | POA: Insufficient documentation

## 2020-05-30 DIAGNOSIS — Q282 Arteriovenous malformation of cerebral vessels: Secondary | ICD-10-CM | POA: Insufficient documentation

## 2020-05-30 DIAGNOSIS — Z7982 Long term (current) use of aspirin: Secondary | ICD-10-CM | POA: Diagnosis not present

## 2020-05-30 DIAGNOSIS — R21 Rash and other nonspecific skin eruption: Secondary | ICD-10-CM | POA: Insufficient documentation

## 2020-05-30 DIAGNOSIS — Z87891 Personal history of nicotine dependence: Secondary | ICD-10-CM | POA: Insufficient documentation

## 2020-05-30 DIAGNOSIS — D649 Anemia, unspecified: Secondary | ICD-10-CM | POA: Insufficient documentation

## 2020-05-30 DIAGNOSIS — G4700 Insomnia, unspecified: Secondary | ICD-10-CM | POA: Insufficient documentation

## 2020-05-30 DIAGNOSIS — I10 Essential (primary) hypertension: Secondary | ICD-10-CM | POA: Insufficient documentation

## 2020-05-30 DIAGNOSIS — H6092 Unspecified otitis externa, left ear: Secondary | ICD-10-CM | POA: Insufficient documentation

## 2020-05-30 DIAGNOSIS — J209 Acute bronchitis, unspecified: Secondary | ICD-10-CM | POA: Insufficient documentation

## 2020-05-30 DIAGNOSIS — F419 Anxiety disorder, unspecified: Secondary | ICD-10-CM | POA: Insufficient documentation

## 2020-05-30 DIAGNOSIS — R609 Edema, unspecified: Secondary | ICD-10-CM | POA: Insufficient documentation

## 2020-05-30 DIAGNOSIS — E876 Hypokalemia: Secondary | ICD-10-CM | POA: Insufficient documentation

## 2020-05-30 DIAGNOSIS — R110 Nausea: Secondary | ICD-10-CM | POA: Insufficient documentation

## 2020-05-30 DIAGNOSIS — R7303 Prediabetes: Secondary | ICD-10-CM | POA: Insufficient documentation

## 2020-05-30 DIAGNOSIS — Z22322 Carrier or suspected carrier of Methicillin resistant Staphylococcus aureus: Secondary | ICD-10-CM | POA: Insufficient documentation

## 2020-05-30 DIAGNOSIS — J4520 Mild intermittent asthma, uncomplicated: Secondary | ICD-10-CM | POA: Insufficient documentation

## 2020-05-30 DIAGNOSIS — K589 Irritable bowel syndrome without diarrhea: Secondary | ICD-10-CM | POA: Insufficient documentation

## 2020-05-30 DIAGNOSIS — S01511A Laceration without foreign body of lip, initial encounter: Secondary | ICD-10-CM | POA: Insufficient documentation

## 2020-05-30 DIAGNOSIS — R531 Weakness: Secondary | ICD-10-CM | POA: Insufficient documentation

## 2020-05-30 DIAGNOSIS — R112 Nausea with vomiting, unspecified: Secondary | ICD-10-CM | POA: Insufficient documentation

## 2020-05-30 DIAGNOSIS — N182 Chronic kidney disease, stage 2 (mild): Secondary | ICD-10-CM | POA: Insufficient documentation

## 2020-05-30 DIAGNOSIS — Z9181 History of falling: Secondary | ICD-10-CM | POA: Insufficient documentation

## 2020-05-30 LAB — CBC WITH DIFFERENTIAL/PLATELET
Abs Immature Granulocytes: 0.04 10*3/uL (ref 0.00–0.07)
Basophils Absolute: 0.1 10*3/uL (ref 0.0–0.1)
Basophils Relative: 1 %
Eosinophils Absolute: 0.1 10*3/uL (ref 0.0–0.5)
Eosinophils Relative: 1 %
HCT: 35.3 % — ABNORMAL LOW (ref 36.0–46.0)
Hemoglobin: 11.5 g/dL — ABNORMAL LOW (ref 12.0–15.0)
Immature Granulocytes: 1 %
Lymphocytes Relative: 39 %
Lymphs Abs: 3.3 10*3/uL (ref 0.7–4.0)
MCH: 29.4 pg (ref 26.0–34.0)
MCHC: 32.6 g/dL (ref 30.0–36.0)
MCV: 90.3 fL (ref 80.0–100.0)
Monocytes Absolute: 0.6 10*3/uL (ref 0.1–1.0)
Monocytes Relative: 7 %
Neutro Abs: 4.4 10*3/uL (ref 1.7–7.7)
Neutrophils Relative %: 51 %
Platelets: 357 10*3/uL (ref 150–400)
RBC: 3.91 MIL/uL (ref 3.87–5.11)
RDW: 14.2 % (ref 11.5–15.5)
WBC: 8.5 10*3/uL (ref 4.0–10.5)
nRBC: 0 % (ref 0.0–0.2)

## 2020-05-30 LAB — BASIC METABOLIC PANEL
Anion gap: 9 (ref 5–15)
BUN: 10 mg/dL (ref 6–20)
CO2: 24 mmol/L (ref 22–32)
Calcium: 8.9 mg/dL (ref 8.9–10.3)
Chloride: 111 mmol/L (ref 98–111)
Creatinine, Ser: 0.91 mg/dL (ref 0.44–1.00)
GFR, Estimated: >60 mL/min (ref >60)
Glucose, Bld: 90 mg/dL (ref 70–99)
Potassium: 3.1 mmol/L — ABNORMAL LOW (ref 3.5–5.1)
Sodium: 144 mmol/L (ref 135–145)

## 2020-05-30 MED ORDER — HYDROMORPHONE HCL 1 MG/ML IJ SOLN
1.0000 mg | Freq: Once | INTRAMUSCULAR | Status: AC
  Administered 2020-05-30: 1 mg via INTRAVENOUS
  Filled 2020-05-30: qty 1

## 2020-05-30 MED ORDER — CLONIDINE HCL 0.2 MG PO TABS
0.2000 mg | ORAL_TABLET | Freq: Once | ORAL | Status: AC
  Administered 2020-05-30: 0.2 mg via ORAL
  Filled 2020-05-30: qty 1

## 2020-05-30 MED ORDER — LABETALOL HCL 5 MG/ML IV SOLN
10.0000 mg | Freq: Once | INTRAVENOUS | Status: AC
  Administered 2020-05-30: 10 mg via INTRAVENOUS
  Filled 2020-05-30: qty 4

## 2020-05-30 MED ORDER — ONDANSETRON HCL 4 MG/2ML IJ SOLN
4.0000 mg | Freq: Once | INTRAMUSCULAR | Status: AC
  Administered 2020-05-30: 4 mg via INTRAVENOUS
  Filled 2020-05-30: qty 2

## 2020-05-30 NOTE — ED Provider Notes (Signed)
[facility] EMERGENCY DEPARTMENT Provider Note   CSN: [id number] Arrival date & time: 05/30/20  1939     History Chief Complaint  Patient presents with  . Hypertension    Renee Gutierrez is a 60 y.o. 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  She denies falls or head injury, denies nausea or vomiting, no fevers or chills.  Her headache pain is similar to prior headache exacerbations.  She is prescribed MS Contin which she takes up to 6 times daily and took her last dose at noon today, having run out of this medication.  She also notes that her blood pressure has been elevated despite taking her blood pressure medications today.  She denies focal new weakness, denies chest pain, no abdominal pain, also denies photophobia or vision changes.  She is legally blind left eye.  She has found no alleviators for her headache pain.   HPI     Past Medical History:  Diagnosis Date  . Anxiety   . Arthritis   . 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 Benign tumor of adrenal gland   . Benign tumor of adrenal gland   . Fibromyalgia   . GERD (gastroesophageal reflux disease)   . Hiatal hernia   . Hyperlipidemia   . Hypertension   . IBS (irritable bowel syndrome)   . Migraines   . S/P Nissen fundoplication (without gastrostomy tube) procedure   . Sciatica   . 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 Anemia, unspecified 05/30/2020  . Acute bronchitis 05/30/2020  . Anxiety 05/30/2020  . AVM (arteriovenous malformation) brain 05/30/2020  . At risk for falls 05/30/2020  . Cellulitis of nose 05/30/2020  . Edema 05/30/2020  . External otitis of left ear 05/30/2020  . Hypokalemia  05/30/2020  . Hyperlipidemia 05/30/2020  . History of intussusception 05/30/2020  . Migraines 05/30/2020  . Restless legs syndrome 05/30/2020  . 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 Fatigue 05/30/2020  . Insomnia 05/30/2020  . Intussusception ([place]) 05/30/2020  . Lip laceration 05/30/2020  . Medicare annual wellness visit, subsequent 05/30/2020  . MRSA (methicillin resistant staph aureus) culture positive 05/30/2020  . Rash 05/30/2020  . Prediabetes 05/30/2020  . Nausea 05/30/2020  . Encounter for general adult medical examination without abnormal findings 05/30/2020  . Chronic hip pain 05/30/2020  . IBS (irritable bowel syndrome) 05/30/2020  . Long-term current use of opiate analgesic 11/19/2018  . ACTH elevation ([place]) 12/12/2016  . Trigeminal neuralgia pain 07/31/2016  . Resistant hypertension 02/18/2016  . Acute viral conjunctivitis of left eye 02/18/2016  . Atypical facial pain 02/18/2016  . Epistaxis 02/14/2016  . 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 Ganglion cyst of dorsum of right wrist 09/13/2015  . Mild intermittent asthma without complication 42/59/5638  . Idiopathic peripheral neuropathy 11/22/2014  . Adrenal adenoma, right 07/10/2014  . Prolactinoma ([place]) 07/07/2014  . Osteoarthritis of cervical spine 06/06/2014  . Myofascial pain syndrome 04/20/2014  . Numbness in both hands 10/20/2013  . Cubital tunnel syndrome 08/24/2013  . Spinal stenosis of lumbar region with neurogenic claudication 08/03/2013  . GAD (generalized anxiety disorder) 09/12/2011  . 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  Low back pain 08/08/2011  . 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 GERD (gastroesophageal reflux disease) 11/01/2009    Past Surgical History:  Procedure Laterality Date  . ABDOMINAL HYSTERECTOMY    . ABDOMINAL SURGERY     tumor removed  . APPENDECTOMY    .  BRAIN SURGERY     to fix AVM  . CEREBRAL ANEURYSM REPAIR    . CERVICAL FUSION    . CHOLECYSTECTOMY    . 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 stent to esophagus       OB History    Gravida  2   Para      Term      Preterm      AB      Living  2     SAB      IAB      Ectopic      Multiple      Live Births              History reviewed. No pertinent family history.  Social History   Tobacco Use  . Smoking status: Former Smoker    Types: Cigarettes  . Smokeless tobacco: Never Used  . Tobacco comment: 1 cigarette daily   Vaping Use  . Vaping Use: Never used  Substance Use Topics  . Alcohol use: No  . Drug use: No    Home Medications Prior to Admission medications   Medication Sig Start Date End Date Taking? Authorizing Provider  amLODipine (NORVASC) 10 MG tablet Take by mouth. 04/09/20  Yes [provider]  amLODipine (NORVASC) 5 MG tablet Take 1 tablet by mouth daily. 02/12/17  Yes [provider]  cephALEXin (KEFLEX) 500 MG capsule TAKE 1 CAPLET BY MOUTH THREE TIMES DAILY FOR 7 DAYS 12/11/19  Yes [provider]  cloNIDine (CATAPRES) 0.2 MG tablet Take 1 tablet by mouth 2 (two) times daily. 05/14/16  Yes [provider]  cyclobenzaprine (FLEXERIL) 10 MG tablet Take by mouth. 10/16/18  Yes [provider]  diclofenac Sodium (VOLTAREN) 1 % GEL Apply topically. 04/25/20 07/24/20 Yes [provider]  DULoxetine (CYMBALTA) 60 MG capsule Take by mouth. 04/25/20 07/24/20 Yes [provider]  erythromycin ophthalmic ointment PLACE INTO THE LEFT EYE 3  TIMES DAILY 04/16/18  Yes [provider]  labetalol (NORMODYNE) 200 MG tablet Take 1 tablet by mouth 2 (two) times daily. 08/30/16  Yes [provider]  mupirocin ointment (BACTROBAN) 2 % Apply topically. 03/25/16  Yes [provider]  Naldemedine Tosylate (SYMPROIC) 0.2 MG TABS Take by mouth. 08/02/19  Yes  [provider]  naloxone (NARCAN) nasal spray 4 mg/0.1 mL Place into the nose. 09/17/18  Yes [provider]  naproxen (NAPROSYN) 500 MG tablet Take by mouth. 06/06/18  Yes [provider]  orphenadrine (NORFLEX) 100 MG tablet Take by mouth. 10/06/19  Yes [provider]  oxcarbazepine (TRILEPTAL) 600 MG tablet  11/09/19  Yes [provider]  Oxycodone HCl 10 MG TABS TK 1-2 TS PO Q 6 H PRF PAIN. MAX OF 8 TABLETS DAILY 05/28/16  Yes [provider]  pantoprazole (PROTONIX) 40 MG tablet  06/24/19  Yes [provider]  senna (SENOKOT) 8.6 MG tablet Take 1 tablet by mouth daily. 08/02/19 08/01/20 Yes [provider]  zolpidem (AMBIEN) 10 MG tablet Take by mouth. 09/18/18  Yes [provider]  amLODipine (NORVASC) 10 MG  tablet Take 1 tablet by mouth daily. 04/10/20   [provider]  amLODipine (NORVASC) 5 MG tablet Take 5 mg by mouth daily.     [provider]  aspirin EC 81 MG tablet Take 81 mg by mouth daily.  09/21/13   [provider]  aspirin-acetaminophen-caffeine (EXCEDRIN MIGRAINE) [phone] MG tablet Take 2 tablets by mouth daily as needed. For pain 05/24/14   [provider]  bacitracin ophthalmic ointment SMARTSIG:1 Sparingly Left Eye 4 Times Daily 03/17/20   [provider]  carbamazepine (TEGRETOL) 200 MG tablet Take by mouth.    [provider]  cephALEXin (KEFLEX) 500 MG capsule SMARTSIG:1 Caplet By Mouth 3 Times Daily 12/11/19   [provider]  chlorhexidine (PERIDEX) 0.12 % solution 10 mLs 4 (four) times daily. 05/17/20   [provider]  cloNIDine (CATAPRES) 0.2 MG tablet Take 0.2 mg by mouth 2 (two) times daily.    [provider]  diclofenac Sodium (VOLTAREN) 1 % GEL Apply 2 g topically 4 (four) times daily. 04/18/20   [provider]  DULoxetine (CYMBALTA) 30 MG capsule Take by mouth.    [provider]  DULoxetine  (CYMBALTA) 60 MG capsule Take by mouth. 05/16/20   [provider]  erythromycin ophthalmic ointment SMARTSIG:In Eye(s) 05/10/20   [provider]  ferrous sulfate 325 (65 FE) MG tablet Take 1 tablet by mouth daily.    [provider]  Fluticasone Furoate-Vilanterol (BREO ELLIPTA) 100-25 MCG/INH AEPB Inhale 1 puff into the lungs daily. 04/25/14   [provider]  ipratropium-albuterol (DUONEB) 0.5-2.5 (3) MG/3ML SOLN Take 3 mLs by nebulization 4 (four) times daily.    [provider]  labetalol (NORMODYNE) 200 MG tablet Take 200 mg by mouth 2 (two) times daily.    [provider]  lisinopril (PRINIVIL,ZESTRIL) 40 MG tablet Take 40 mg by mouth daily. 01/31/14 01/31/15  [provider]  methylPREDNISolone (MEDROL DOSEPAK) 4 MG TBPK tablet As directed 10/22/15   Rancour, Annie Main, MD  morphine (MSIR) 15 MG tablet Take by mouth. 05/08/20   [provider]  moxifloxacin (VIGAMOX) 0.5 % ophthalmic solution Place 1 drop into the left eye 4 (four) times daily. 03/17/20   [provider]  mupirocin ointment (BACTROBAN) 2 % Apply topically 3 (three) times daily. 03/18/20   [provider]  ondansetron (ZOFRAN) 4 MG tablet Take 4 mg by mouth every 8 (eight) hours as needed.    [provider]  orphenadrine (NORFLEX) 100 MG tablet Take 100 mg by mouth 2 (two) times daily. 05/03/20   [provider]  oxcarbazepine (TRILEPTAL) 600 MG tablet Take by mouth. 04/02/20   [provider]  OXERVATE 0.002 % SOLN Apply to eye. 05/26/20   [provider]  pantoprazole (PROTONIX) 40 MG tablet Take 1 tablet by mouth daily. 03/25/20   [provider]  Potassium Chloride ER 20 MEQ TBCR Take 20 mEq by mouth daily. 11/23/14   [provider]  pravastatin (PRAVACHOL) 40 MG tablet Take 40 mg by mouth at bedtime. 11/28/14 11/28/15  [provider]  prednisoLONE acetate (PRED FORTE) 1 % ophthalmic  suspension 1 drop 4 (four) times daily. 03/01/20   [provider]  promethazine (PHENERGAN) 25 MG tablet Take 1 tablet (25 mg total) by mouth every 6 (six) hours as needed for nausea or vomiting. 07/21/16   Noemi Chapel, MD  rOPINIRole (REQUIP) 2 MG tablet Take 2 mg by mouth 2 (two) times daily. 11/11/14  [provider]  simethicone (GAS RELIEF EXTRA STRENGTH) 125 MG chewable tablet Chew 125 mg by mouth daily as needed for flatulence.  05/24/14   [provider]  tapentadol (NUCYNTA) 100 MG 12 hr tablet Take by mouth.    [provider]  tapentadol (NUCYNTA) 50 MG tablet Take by mouth.    [provider]  tizanidine (ZANAFLEX) 2 MG capsule Take by mouth.    [provider]  topiramate (TOPAMAX) 25 MG tablet Take 50 mg by mouth at bedtime. 11/24/14   [provider]  traMADol (ULTRAM) 50 MG tablet Take 50 mg by mouth every 6 (six) hours as needed. pain 02/24/14   [provider]  zolpidem (AMBIEN) 10 MG tablet Take 10 mg by mouth at bedtime as needed. 05/02/20   [provider]    Allergies    Buprenorphine hcl, Bupropion, Codeine, Pregabalin, Cymbalta [duloxetine hcl], Diclofenac, Diclofenac sodium, Diclofenac sodium, Gabapentin, Nsaids, Sulfa antibiotics, Clindamycin, Clindamycin/lincomycin, and Toradol [ketorolac tromethamine]  Review of Systems   Review of Systems  Constitutional: Negative for fever.  HENT: Negative for congestion and sore throat.   Eyes: Negative.   Respiratory: Negative for chest tightness and shortness of breath.   Cardiovascular: Negative for chest pain.  Gastrointestinal: Negative for abdominal pain and nausea.  Genitourinary: Negative.   Musculoskeletal: Negative for arthralgias, joint swelling and neck pain.  Skin: Negative.  Negative for rash and wound.  Neurological: Positive for headaches. Negative for dizziness, weakness, light-headedness and numbness.  Psychiatric/Behavioral:  Negative.     Physical Exam Updated Vital Signs BP (!) 187/93   Pulse 74   Temp 99.2 F (37.3 C) (Oral)   Resp 20   SpO2 97%   Physical Exam Vitals and nursing note reviewed.  Constitutional:      Appearance: She is well-developed.     Comments: Uncomfortable appearing  HENT:     Head: Normocephalic and atraumatic.  Eyes:     Pupils: Pupils are equal, round, and reactive to light.  Cardiovascular:     Rate and Rhythm: Normal rate.     Heart sounds: Normal heart sounds.  Pulmonary:     Effort: Pulmonary effort is normal.  Abdominal:     Palpations: Abdomen is soft.     Tenderness: There is no abdominal tenderness.  Musculoskeletal:        General: Normal range of motion.     Cervical back: Normal range of motion and neck supple.  Lymphadenopathy:     Cervical: No cervical adenopathy.  Skin:    General: Skin is warm and dry.     Findings: No rash.  Neurological:     Mental Status: She is alert and oriented to person, place, and time.     GCS: GCS eye subscore is 4. GCS verbal subscore is 5. GCS motor subscore is 6.     Sensory: No sensory deficit.     Gait: Gait normal.     Comments: Normal heel-shin, normal rapid alternating movements. Cranial nerves III-XII intact.  No pronator drift.  Psychiatric:        Speech: Speech normal.        Behavior: Behavior normal.        Thought Content: Thought content normal.     ED Results / Procedures / Treatments   Labs (all labs ordered are listed, but only abnormal results are displayed) Labs Reviewed  CBC WITH DIFFERENTIAL/PLATELET - Abnormal; Notable for the following components:  Result Value   Hemoglobin 11.5 (*)    HCT 35.3 (*)    All other components within normal limits  BASIC METABOLIC PANEL - Abnormal; Notable for the following components:   Potassium 3.1 (*)    All other components within normal limits    EKG EKG Interpretation  Date/Time:  Tuesday May 30 2020 21:27:50 EDT Ventricular Rate:   74 PR Interval:  154 QRS Duration: 88 QT Interval:  410 QTC Calculation: 455 R Axis:   29 Text Interpretation: Sinus rhythm Confirmed by Octaviano Glow [phone]) on 05/30/2020 10:41:19 PM   Radiology No results found.  Procedures Procedures   Medications Ordered in ED Medications  HYDROmorphone (DILAUDID) injection 1 mg (1 mg Intravenous Given 05/30/20 2202)  ondansetron (ZOFRAN) injection 4 mg (4 mg Intravenous Given 05/30/20 2202)  HYDROmorphone (DILAUDID) injection 1 mg (1 mg Intravenous Given 05/30/20 2313)  labetalol (NORMODYNE) injection 10 mg (10 mg Intravenous Given 05/30/20 2338)  cloNIDine (CATAPRES) tablet 0.2 mg (0.2 mg Oral Given 05/30/20 2338)  labetalol (NORMODYNE) injection 10 mg (10 mg Intravenous Given 05/31/20 0009)    ED Course  I have reviewed the triage vital signs and the nursing notes.  Pertinent labs & imaging results that were available during my care of the patient were reviewed by me and considered in my medical decision making (see chart for details).    MDM Rules/Calculators/A&P                          Pt with acute on chronic headache who took her last MS Contin tablet today around noon, chronic pain management at [facility] .  She was given IV dilaudid 1 mg, repeated x 1 after which her headache pain was at baseline - states her head and face pain chronically 9/10,  Currently 8/10 so better than baseline.  No neuro deficits on exam. She did have a significant elevation in blood pressure here.  She has not had her evening dose of her labetalol and her clonidine.  These were given her and observed until bp improved.  Labs reviewed, stable.    Pt advised she will need to contact her pain management provider for MS Contin refill.  She was given oxycodone #6 prepack to cover her from withdrawal until can arrange refills by her provider.  Final Clinical Impression(s) / ED Diagnoses Final diagnoses:  Chronic nonintractable headache, unspecified headache type   Primary hypertension    Rx / DC Orders ED Discharge Orders    None       Landis Martins 05/31/20 0016    Wyvonnia Dusky, MD 05/31/20 [phone]

## 2020-05-30 NOTE — ED Triage Notes (Signed)
Headache on LT side that started last night. bp has been elevated

## 2020-05-31 DIAGNOSIS — I10 Essential (primary) hypertension: Secondary | ICD-10-CM | POA: Diagnosis not present

## 2020-05-31 MED ORDER — OXYCODONE-ACETAMINOPHEN 5-325 MG PO TABS: 1.0000 | ORAL_TABLET | Freq: Four times a day (QID) | ORAL | 0 refills | Status: DC | PRN

## 2020-05-31 MED ORDER — LABETALOL HCL 5 MG/ML IV SOLN
10.0000 mg | Freq: Once | INTRAVENOUS | Status: AC
  Administered 2020-05-31: 10 mg via INTRAVENOUS
  Filled 2020-05-31: qty 4

## 2020-05-31 NOTE — ED Notes (Signed)
Pt reports that she is out of her Morphine 15mg  tablets and her pharmacy will not have them in stock until next Wednesday.

## 2020-05-31 NOTE — Discharge Instructions (Signed)
Tablets you were given here this evening.  Follow-up with your pain specialist at [facility] tomorrow regarding your medication refills.

## 2020-06-01 MED FILL — Oxycodone w/ Acetaminophen Tab 5-325 MG: ORAL | Qty: 6 | Status: AC

## 2020-07-18 ENCOUNTER — Encounter (HOSPITAL_COMMUNITY): Payer: Self-pay | Admitting: Emergency Medicine

## 2020-07-18 ENCOUNTER — Emergency Department (HOSPITAL_COMMUNITY)
Admission: EM | Admit: 2020-07-18 | Discharge: 2020-07-19 | Disposition: A | Discharge status: Home / Self Care | Payer: Medicare PPO | Attending: Emergency Medicine | Admitting: Emergency Medicine

## 2020-07-18 ENCOUNTER — Other Ambulatory Visit: Payer: Self-pay

## 2020-07-18 DIAGNOSIS — R1310 Dysphagia, unspecified: Secondary | ICD-10-CM | POA: Insufficient documentation

## 2020-07-18 DIAGNOSIS — W19XXXA Unspecified fall, initial encounter: Secondary | ICD-10-CM | POA: Insufficient documentation

## 2020-07-18 DIAGNOSIS — M542 Cervicalgia: Secondary | ICD-10-CM | POA: Insufficient documentation

## 2020-07-18 DIAGNOSIS — M25511 Pain in right shoulder: Secondary | ICD-10-CM | POA: Insufficient documentation

## 2020-07-18 DIAGNOSIS — Z5321 Procedure and treatment not carried out due to patient leaving prior to being seen by health care provider: Secondary | ICD-10-CM | POA: Insufficient documentation

## 2020-07-18 DIAGNOSIS — M25512 Pain in left shoulder: Secondary | ICD-10-CM | POA: Diagnosis not present

## 2020-07-18 NOTE — ED Triage Notes (Addendum)
Pt c/o neck and bilateral shoulder pain since fall Monday. Pt also c/o having trouble swallowing since fall. Pt denies any loc.

## 2020-07-19 NOTE — ED Provider Notes (Signed)
Apparently patient left without being seen.    Renee Gutierrez, Corene Cornea, MD 07/19/20 [phone]

## 2020-07-19 NOTE — ED Notes (Signed)
Pt states "I am checking myself out. I have been waiting for too long. I thought this was a hospital but I haven't seen a doctor yet." Nurse explains the importance of being seen and treated. Pt states "I know and I am leaving at my own risk. I cannot wait any longer".

## 2020-07-19 NOTE — ED Notes (Signed)
Pt walked out stating she has waited long enough to see the doctor.

## 2023-03-30 ENCOUNTER — Emergency Department (HOSPITAL_COMMUNITY)
Admission: EM | Admit: 2023-03-30 | Discharge: 2023-03-30 | Disposition: A | Discharge status: Home / Self Care | Payer: Medicare PPO | Attending: Emergency Medicine | Admitting: Emergency Medicine

## 2023-03-30 ENCOUNTER — Emergency Department (HOSPITAL_COMMUNITY): Payer: Medicare PPO

## 2023-03-30 ENCOUNTER — Other Ambulatory Visit: Payer: Self-pay

## 2023-03-30 DIAGNOSIS — Z7982 Long term (current) use of aspirin: Secondary | ICD-10-CM | POA: Diagnosis not present

## 2023-03-30 DIAGNOSIS — I1A0 Resistant hypertension: Secondary | ICD-10-CM | POA: Diagnosis not present

## 2023-03-30 DIAGNOSIS — F17200 Nicotine dependence, unspecified, uncomplicated: Secondary | ICD-10-CM | POA: Insufficient documentation

## 2023-03-30 DIAGNOSIS — R519 Headache, unspecified: Secondary | ICD-10-CM | POA: Insufficient documentation

## 2023-03-30 DIAGNOSIS — Z79899 Other long term (current) drug therapy: Secondary | ICD-10-CM | POA: Diagnosis not present

## 2023-03-30 LAB — CBC WITH DIFFERENTIAL/PLATELET
Abs Immature Granulocytes: 0.04 10*3/uL (ref 0.00–0.07)
Basophils Absolute: 0.1 10*3/uL (ref 0.0–0.1)
Basophils Relative: 1 %
Eosinophils Absolute: 0.1 10*3/uL (ref 0.0–0.5)
Eosinophils Relative: 1 %
HCT: 41.3 % (ref 36.0–46.0)
Hemoglobin: 13.5 g/dL (ref 12.0–15.0)
Immature Granulocytes: 1 %
Lymphocytes Relative: 24 %
Lymphs Abs: 2 10*3/uL (ref 0.7–4.0)
MCH: 28.8 pg (ref 26.0–34.0)
MCHC: 32.7 g/dL (ref 30.0–36.0)
MCV: 88.1 fL (ref 80.0–100.0)
Monocytes Absolute: 0.5 10*3/uL (ref 0.1–1.0)
Monocytes Relative: 7 %
Neutro Abs: 5.5 10*3/uL (ref 1.7–7.7)
Neutrophils Relative %: 66 %
Platelets: 316 10*3/uL (ref 150–400)
RBC: 4.69 MIL/uL (ref 3.87–5.11)
RDW: 13.8 % (ref 11.5–15.5)
WBC: 8.3 10*3/uL (ref 4.0–10.5)
nRBC: 0 % (ref 0.0–0.2)

## 2023-03-30 LAB — BASIC METABOLIC PANEL
Anion gap: 11 (ref 5–15)
BUN: 11 mg/dL (ref 8–23)
CO2: 23 mmol/L (ref 22–32)
Calcium: 9.3 mg/dL (ref 8.9–10.3)
Chloride: 106 mmol/L (ref 98–111)
Creatinine, Ser: 0.74 mg/dL (ref 0.44–1.00)
GFR, Estimated: >60 mL/min (ref >60)
Glucose, Bld: 110 mg/dL — ABNORMAL HIGH (ref 70–99)
Potassium: 3.4 mmol/L — ABNORMAL LOW (ref 3.5–5.1)
Sodium: 140 mmol/L (ref 135–145)

## 2023-03-30 MED ORDER — CLONIDINE HCL 0.2 MG PO TABS
0.2000 mg | ORAL_TABLET | Freq: Once | ORAL | Status: AC
  Administered 2023-03-30: 0.2 mg via ORAL
  Filled 2023-03-30: qty 1

## 2023-03-30 MED ORDER — ONDANSETRON HCL 4 MG/2ML IJ SOLN
4.0000 mg | Freq: Once | INTRAMUSCULAR | Status: AC
  Administered 2023-03-30: 4 mg via INTRAVENOUS
  Filled 2023-03-30: qty 2

## 2023-03-30 MED ORDER — HYDROMORPHONE HCL 1 MG/ML IJ SOLN
1.0000 mg | Freq: Once | INTRAMUSCULAR | Status: AC
  Administered 2023-03-30: 1 mg via INTRAVENOUS
  Filled 2023-03-30: qty 1

## 2023-03-30 MED ORDER — KETAMINE HCL 50 MG/5ML IJ SOSY
0.3000 mg/kg | PREFILLED_SYRINGE | Freq: Once | INTRAMUSCULAR | Status: AC
  Administered 2023-03-30: 13 mg via INTRAVENOUS
  Filled 2023-03-30: qty 5

## 2023-03-30 NOTE — ED Triage Notes (Signed)
 Pt complains of nausea, hypertension, and headache for couple of days. Pt also complains of falls recently. States she has had brain surgery in the past and balance has been off since.

## 2023-03-30 NOTE — ED Notes (Signed)
 Patient transported to CT

## 2023-03-30 NOTE — Discharge Instructions (Signed)
 You are seen in the emergency department for elevated blood pressure and worsening headache.  Your lab work and CAT scan were unremarkable.  You had some improvement in your pain after some medication.  Your blood pressure remains elevated.  Please take your regular medications and follow-up with your primary care doctor and your pain clinic team for further management.

## 2023-03-30 NOTE — ED Provider Notes (Signed)
 [place] EMERGENCY DEPARTMENT AT [facility] Provider Note   CSN: [id number] Arrival date & time: 03/30/23  9183     History  Chief Complaint  Patient presents with   Hypertension   Headache   Nausea    Renee Gutierrez is a 63 y.o. female.  She has a history of chronic headaches due to prior trigeminal neuralgia.  She is on chronic narcotics.  She is complaining of uncontrolled headache over the last few days despite taking her medicines.  She said she is out of her medicines now and cannot refill them until tomorrow.  Last took a pill around midnight.  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  Nausea no vomiting.  No numbness or weakness chest pain or shortness of breath.  She said nothing helps her headache.  Her blood pressures are also chronically elevated and her doctors have been adjusting her medications with no significant improvement.  The history is provided by the patient.  Headache Pain location:  L temporal Radiates to:  Face Severity currently:  10/10 Severity at highest:  10/10 Onset quality:  Gradual Duration:  3 days Timing:  Constant Progression:  Unchanged Chronicity:  Chronic Similar to prior headaches: yes   Associated symptoms: facial pain and nausea   Associated symptoms: no abdominal pain, no blurred vision, no cough, no fever, no focal weakness and no visual change        Home Medications Prior to Admission medications   Medication Sig Start Date End Date Taking? Authorizing Provider  amLODipine  (NORVASC ) 10 MG tablet Take 1 tablet by mouth daily. 04/10/20   [provider]  amLODipine  (NORVASC ) 10 MG tablet Take by mouth. 04/09/20   [provider]  amLODipine  (NORVASC ) 5 MG tablet Take 5 mg by mouth daily.     [provider]  amLODipine  (NORVASC ) 5 MG tablet Take 1 tablet by mouth daily. 02/12/17   [provider]  aspirin  EC 81 MG tablet Take 81 mg by mouth  daily.  09/21/13   [provider]  aspirin -acetaminophen -caffeine  (EXCEDRIN  MIGRAINE) 250-250-65 MG tablet Take 2 tablets by mouth daily as needed. For pain 05/24/14   [provider]  bacitracin ophthalmic ointment SMARTSIG:1 Sparingly Left Eye 4 Times Daily 03/17/20   [provider]  carbamazepine (TEGRETOL) 200 MG tablet Take by mouth.    [provider]  cephALEXin (KEFLEX) 500 MG capsule SMARTSIG:1 Caplet By Mouth 3 Times Daily 12/11/19   [provider]  cephALEXin (KEFLEX) 500 MG capsule TAKE 1 CAPLET BY MOUTH THREE TIMES DAILY FOR 7 DAYS 12/11/19   [provider]  chlorhexidine  (PERIDEX ) 0.12 % solution 10 mLs 4 (four) times daily. 05/17/20   [provider]  cloNIDine  (CATAPRES ) 0.2 MG tablet Take 0.2 mg by mouth 2 (two) times daily.    [provider]  cloNIDine  (CATAPRES ) 0.2 MG tablet Take 1 tablet by mouth 2 (two) times daily. 05/14/16   [provider]  cyclobenzaprine (FLEXERIL) 10 MG tablet Take by mouth. 10/16/18   [provider]  diclofenac Sodium (VOLTAREN) 1 % GEL Apply 2 g topically 4 (four) times daily. 04/18/20   [provider]  DULoxetine  (CYMBALTA ) 30 MG capsule Take by mouth.    [provider]  DULoxetine  (CYMBALTA ) 60 MG capsule Take by mouth. 04/25/20 07/24/20  [provider]  DULoxetine  (CYMBALTA ) 60 MG capsule Take by mouth. 05/16/20   [provider]  erythromycin  ophthalmic ointment PLACE INTO THE LEFT EYE 3  TIMES DAILY 04/16/18   [provider]  erythromycin  ophthalmic ointment SMARTSIG:In Eye(s) 05/10/20   [provider]  ferrous sulfate  325 (65 FE) MG tablet Take 1 tablet by mouth daily.    [provider]  Fluticasone Furoate-Vilanterol (BREO ELLIPTA) 100-25 MCG/INH AEPB Inhale 1 puff into the lungs daily. 04/25/14   [provider]  ipratropium-albuterol  (DUONEB) 0.5-2.5 (3) MG/3ML SOLN Take 3 mLs by  nebulization 4 (four) times daily.    [provider]  labetalol  (NORMODYNE ) 200 MG tablet Take 200 mg by mouth 2 (two) times daily.    [provider]  labetalol  (NORMODYNE ) 200 MG tablet Take 1 tablet by mouth 2 (two) times daily. 08/30/16   [provider]  lisinopril  (PRINIVIL ,ZESTRIL ) 40 MG tablet Take 40 mg by mouth daily. 01/31/14 01/31/15  [provider]  methylPREDNISolone  (MEDROL  DOSEPAK) 4 MG TBPK tablet As directed 10/22/15   Rancour, Garnette, MD  morphine  (MSIR) 15 MG tablet Take by mouth. 05/08/20   [provider]  moxifloxacin (VIGAMOX) 0.5 % ophthalmic solution Place 1 drop into the left eye 4 (four) times daily. 03/17/20   [provider]  mupirocin ointment (BACTROBAN) 2 % Apply topically. 03/25/16   [provider]  mupirocin ointment (BACTROBAN) 2 % Apply topically 3 (three) times daily. 03/18/20   [provider]  Naldemedine Tosylate (SYMPROIC) 0.2 MG TABS Take by mouth. 08/02/19   [provider]  naloxone  (NARCAN ) nasal spray 4 mg/0.1 mL Place into the nose. 09/17/18   [provider]  naproxen  (NAPROSYN ) 500 MG tablet Take by mouth. 06/06/18   [provider]  ondansetron  (ZOFRAN ) 4 MG tablet Take 4 mg by mouth every 8 (eight) hours as needed.    [provider]  orphenadrine  (NORFLEX ) 100 MG tablet Take by mouth. 10/06/19   [provider]  orphenadrine  (NORFLEX ) 100 MG tablet Take 100 mg by mouth 2 (two) times daily. 05/03/20   [provider]  oxcarbazepine (TRILEPTAL) 600 MG tablet  11/09/19   [provider]  oxcarbazepine (TRILEPTAL) 600 MG tablet Take by mouth. 04/02/20   [provider]  OXERVATE 0.002 % SOLN Apply to eye. 05/26/20   [provider]  Oxycodone  HCl 10 MG TABS TK 1-2 TS PO Q 6 H PRF PAIN. MAX OF 8 TABLETS DAILY 05/28/16   [provider]  oxyCODONE -acetaminophen  (PERCOCET/ROXICET) 5-325 MG tablet Take 1  tablet by mouth every 6 (six) hours as needed for severe pain. 05/31/20   Idol, Julie, PA-C  pantoprazole  (PROTONIX ) 40 MG tablet  06/24/19   [provider]  pantoprazole  (PROTONIX ) 40 MG tablet Take 1 tablet by mouth daily. 03/25/20   [provider]  Potassium Chloride  ER 20 MEQ TBCR Take 20 mEq by mouth daily. 11/23/14   [provider]  pravastatin (PRAVACHOL) 40 MG tablet Take 40 mg by mouth at bedtime. 11/28/14 11/28/15  [provider]  prednisoLONE  acetate (PRED FORTE ) 1 % ophthalmic suspension 1 drop 4 (four) times daily. 03/01/20   [provider]  promethazine  (PHENERGAN ) 25 MG tablet Take 1 tablet (25 mg total) by mouth every 6 (six) hours as needed for nausea or vomiting. 07/21/16   Cleotilde Rogue, MD  rOPINIRole  (REQUIP ) 2 MG tablet Take 2 mg by mouth 2 (two) times daily. 11/11/14   [provider]  simethicone  (GAS RELIEF EXTRA STRENGTH) 125 MG chewable tablet Chew 125 mg  by mouth daily as needed for flatulence.  05/24/14   [provider]  tapentadol (NUCYNTA) 100 MG 12 hr tablet Take by mouth.    [provider]  tapentadol (NUCYNTA) 50 MG tablet Take by mouth.    [provider]  tizanidine (ZANAFLEX) 2 MG capsule Take by mouth.    [provider]  topiramate  (TOPAMAX ) 25 MG tablet Take 50 mg by mouth at bedtime. 11/24/14   [provider]  traMADol (ULTRAM) 50 MG tablet Take 50 mg by mouth every 6 (six) hours as needed. pain 02/24/14   [provider]  zolpidem (AMBIEN) 10 MG tablet Take by mouth. 09/18/18   [provider]  zolpidem (AMBIEN) 10 MG tablet Take 10 mg by mouth at bedtime as needed. 05/02/20   [provider]      Allergies    Buprenorphine hcl, Bupropion, Codeine, Pregabalin , Cymbalta  [duloxetine  hcl], Diclofenac, Diclofenac sodium, Diclofenac sodium, Gabapentin, Nsaids, Sulfa antibiotics, Clindamycin, Clindamycin/lincomycin, and Toradol  [ketorolac   tromethamine ]    Review of Systems   Review of Systems  Constitutional:  Negative for fever.  Eyes:  Negative for blurred vision.  Respiratory:  Negative for cough.   Cardiovascular:  Negative for chest pain.  Gastrointestinal:  Positive for nausea. Negative for abdominal pain.  Neurological:  Positive for headaches. Negative for focal weakness.    Physical Exam Updated Vital Signs BP (!) 216/92 (BP Location: Left Arm)   Pulse (!) 58   Temp 98.2 F (36.8 C) (Oral)   Resp 20   Ht 4' 11 (1.499 m)   Wt 44.9 kg   SpO2 99%   BMI 19.99 kg/m  Physical Exam Vitals and nursing note reviewed.  Constitutional:      General: She is not in acute distress.    Appearance: She is well-developed.  HENT:     Head: Normocephalic and atraumatic.  Eyes:     Conjunctiva/sclera: Conjunctivae normal.  Cardiovascular:     Rate and Rhythm: Normal rate and regular rhythm.     Heart sounds: No murmur heard. Pulmonary:     Effort: Pulmonary effort is normal. No respiratory distress.     Breath sounds: Normal breath sounds.  Abdominal:     Palpations: Abdomen is soft.     Tenderness: There is no abdominal tenderness.  Musculoskeletal:        General: No swelling.     Cervical back: Neck supple.  Skin:    General: Skin is warm and dry.     Capillary Refill: Capillary refill takes less than 2 seconds.  Neurological:     Mental Status: She is alert.     GCS: GCS eye subscore is 4. GCS verbal subscore is 5. GCS motor subscore is 6.     Motor: No weakness.     Gait: Gait normal.     ED Results / Procedures / Treatments   Labs (all labs ordered are listed, but only abnormal results are displayed) Labs Reviewed  BASIC METABOLIC PANEL - Abnormal; Notable for the following components:      Result Value   Potassium 3.4 (*)    Glucose, Bld 110 (*)    All other components within normal limits  CBC WITH DIFFERENTIAL/PLATELET    EKG EKG Interpretation Date/Time:  Sunday March 30 2023  08:36:16 EST Ventricular Rate:  58 PR Interval:  155 QRS Duration:  89 QT Interval:  435 QTC Calculation: 428 R Axis:   29  Text Interpretation: Sinus rhythm Anteroseptal  infarct, age indeterminate No significant change since prior 4/22 Confirmed by Towana Sharper [phone]) on 03/30/2023 8:39:07 AM  Radiology CT Head Wo Contrast Result Date: 03/30/2023 CLINICAL DATA:  Worsening headaches. 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 EXAM: CT HEAD WITHOUT CONTRAST TECHNIQUE: Contiguous axial images were obtained from the base of the skull through the vertex without intravenous contrast. RADIATION DOSE REDUCTION: This exam was performed according to the departmental dose-optimization program which includes automated exposure control, adjustment of the mA and/or kV according to patient size and/or use of iterative reconstruction technique. COMPARISON:  No hyperdense vessel or unexpected calcification. FINDINGS: Brain: There is no evidence for acute hemorrhage, hydrocephalus, mass lesion, or abnormal extra-axial fluid collection. No definite CT evidence for acute infarction. Postsurgical encephalomalacia noted left cerebellum Vascular: No hyperdense vessel or unexpected calcification. Skull: Postsurgical changes noted in the occipital region. No acute findings. Sinuses/Orbits: The visualized paranasal sinuses and mastoid air cells are clear. Visualized portions of the globes and intraorbital fat are unremarkable. Other: None. IMPRESSION: 1. No acute intracranial abnormality. 2. Postsurgical encephalomalacia left cerebellum. Electronically Signed   By: Camellia Candle M.D.   On: 03/30/2023 09:29    Procedures Procedures    Medications Ordered in ED Medications  HYDROmorphone  (DILAUDID ) injection 1 mg (1 mg Intravenous Given 03/30/23 0910)  ondansetron  (ZOFRAN ) injection 4 mg (4 mg Intravenous Given 03/30/23 0910)  cloNIDine  (CATAPRES ) tablet 0.2 mg (0.2 mg  Oral Given 03/30/23 1007)  ketamine  50 mg in normal saline 5 mL (10 mg/mL) syringe (13 mg Intravenous Given 03/30/23 1020)    ED Course/ Medical Decision Making/ A&P Clinical Course as of 03/30/23 1721  Sun Mar 30, 2023  1010 Patient states her pain is a little bit better.  Blood pressure remains high which she says it always is.  She said she has had some luck with a dose of ketamine .  I will order this. [MB]  1113 Patient stated Headache is much improved after the ketamine .  She is comfortable plan for discharge. [MB]    Clinical Course User Index [MB] Towana Sharper BROCKS, MD                                 Medical Decision Making Amount and/or Complexity of Data Reviewed Labs: ordered. Radiology: ordered.  Risk Prescription drug management.   This patient complains of headache elevated blood pressure; this involves an extensive number of treatment Options and is a complaint that carries with it a high risk of complications and morbidity. The differential includes hypertension, hypertensive emergency, stroke, bleed, chronic headache, trigeminal neuralgia  I ordered, reviewed and interpreted labs, which included CBC normal chemistries unremarkable I ordered medication IV pain medication nausea medication, IV ketamine  and reviewed PMP when indicated. I ordered imaging studies which included CT head and I independently    visualized and interpreted imaging which showed no acute findings Additional history obtained from patient's family member Previous records obtained and reviewed in epic, follows with [facility] for chronic headaches Cardiac monitoring reviewed, sinus bradycardia Social determinants considered, tobacco use Critical Interventions: None  After the interventions stated above, I reevaluated the patient and found patient to be symptomatically improved although blood pressure remains elevated Admission and further testing considered, no indications for admission.   Patient states her blood pressure is always this high and PCP is continuing to work on it.  She  feels symptomatically improved from her headache.  Recommended close follow-up with her treatment team.  Return instructions discussed         Final Clinical Impression(s) / ED Diagnoses Final diagnoses:  Generalized headache  Resistant hypertension    Rx / DC Orders ED Discharge Orders     None         Towana Ozell BROCKS, MD 03/30/23 1723

## 2023-04-01 ENCOUNTER — Other Ambulatory Visit: Payer: Self-pay

## 2023-04-01 ENCOUNTER — Emergency Department (HOSPITAL_COMMUNITY)
Admission: EM | Admit: 2023-04-01 | Discharge: 2023-04-01 | Disposition: A | Discharge status: Home / Self Care | Payer: Medicare PPO | Attending: Emergency Medicine | Admitting: Emergency Medicine

## 2023-04-01 DIAGNOSIS — W01198A Fall on same level from slipping, tripping and stumbling with subsequent striking against other object, initial encounter: Secondary | ICD-10-CM | POA: Insufficient documentation

## 2023-04-01 DIAGNOSIS — I10 Essential (primary) hypertension: Secondary | ICD-10-CM

## 2023-04-01 DIAGNOSIS — S4992XA Unspecified injury of left shoulder and upper arm, initial encounter: Secondary | ICD-10-CM | POA: Diagnosis present

## 2023-04-01 DIAGNOSIS — R519 Headache, unspecified: Secondary | ICD-10-CM

## 2023-04-01 MED ORDER — TETRACAINE HCL 0.5 % OP SOLN
1.0000 [drp] | Freq: Once | OPHTHALMIC | Status: AC
  Administered 2023-04-01: 1 [drp] via OPHTHALMIC
  Filled 2023-04-01: qty 4

## 2023-04-01 MED ORDER — LABETALOL HCL 200 MG PO TABS
200.0000 mg | ORAL_TABLET | Freq: Two times a day (BID) | ORAL | Status: DC
  Administered 2023-04-01: 200 mg via ORAL
  Filled 2023-04-01: qty 1

## 2023-04-01 MED ORDER — AMLODIPINE BESYLATE 5 MG PO TABS
10.0000 mg | ORAL_TABLET | Freq: Every day | ORAL | Status: DC
  Administered 2023-04-01: 10 mg via ORAL
  Filled 2023-04-01: qty 2

## 2023-04-01 MED ORDER — PROCHLORPERAZINE EDISYLATE 10 MG/2ML IJ SOLN
10.0000 mg | Freq: Once | INTRAMUSCULAR | Status: AC
  Administered 2023-04-01: 10 mg via INTRAVENOUS
  Filled 2023-04-01: qty 2

## 2023-04-01 MED ORDER — HYDROMORPHONE HCL 1 MG/ML IJ SOLN
1.0000 mg | Freq: Once | INTRAMUSCULAR | Status: AC
  Administered 2023-04-01: 1 mg via INTRAVENOUS
  Filled 2023-04-01: qty 1

## 2023-04-01 MED ORDER — DIPHENHYDRAMINE HCL 50 MG/ML IJ SOLN
12.5000 mg | Freq: Once | INTRAMUSCULAR | Status: AC
  Administered 2023-04-01: 12.5 mg via INTRAVENOUS
  Filled 2023-04-01: qty 1

## 2023-04-01 MED ORDER — CLONIDINE HCL 0.2 MG PO TABS
0.2000 mg | ORAL_TABLET | Freq: Two times a day (BID) | ORAL | Status: DC
  Administered 2023-04-01: 0.2 mg via ORAL
  Filled 2023-04-01: qty 1

## 2023-04-01 MED ORDER — LACTATED RINGERS IV BOLUS
1000.0000 mL | Freq: Once | INTRAVENOUS | Status: AC
  Administered 2023-04-01: 1000 mL via INTRAVENOUS

## 2023-04-01 MED ORDER — ONDANSETRON HCL 4 MG/2ML IJ SOLN
4.0000 mg | Freq: Once | INTRAMUSCULAR | Status: AC
  Administered 2023-04-01: 4 mg via INTRAVENOUS
  Filled 2023-04-01: qty 2

## 2023-04-01 NOTE — ED Provider Notes (Signed)
 [place] [facility]MENT AT [facility] Provider Note   CSN: [id number] Arrival date & time: 04/01/23  1656     History  Chief Complaint  Patient presents with   Emesis    Pt endorses vomiting starting today due to pain felt on left side of face. Pt states the pain is burning. Hx of trigeminal neuropathy waiting on Prior authorization for pain med. Pt also presents with HTN, states she took her meds today but vomited afterwards.   Headache   Hypertension    ZAREYA TUCKETT is a 63 y.o. female.   Emesis Associated symptoms: headaches   Headache Associated symptoms: nausea and vomiting   Hypertension Associated symptoms include headaches.  Patient presents for left facial pain.  Medical history includes anemia, anxiety, brain AVM, arthritis, HLD, migraine headaches, RLS, CKD, prediabetes, GERD, IBD, depression, trigeminal neuralgia.  She was seen in the ED 2 days ago for headache.  At the time, she had nausea without vomiting.  Blood pressure was also elevated at the time.  She was treated with Dilaudid, Zofran, clonidine, and ketamine.  She had improved symptoms after the ketamine.  Today, she was seen at [facility] by what she describes as a pain management specialist.  She underwent several medication changes.  She was told to discontinue her narcotic medication for the next 2 days prior to starting Nucynta.  She subsequently had worsened pain.  She had an episode of emesis which she attributes to the pain.  Emesis was shortly after she took her blood pressure medications.  She denies any changes to her pain other than worse in severity today.     Home Medications Prior to Admission medications   Medication Sig Start Date End Date Taking? Authorizing Provider  amLODipine (NORVASC) 10 MG tablet Take 1 tablet by mouth daily. 04/10/20   [provider]  amLODipine (NORVASC) 10 MG tablet Take by mouth. 04/09/20   [provider]  amLODipine (NORVASC) 5 MG  tablet Take 5 mg by mouth daily.     [provider]  amLODipine (NORVASC) 5 MG tablet Take 1 tablet by mouth daily. 02/12/17   [provider]  aspirin EC 81 MG tablet Take 81 mg by mouth daily.  09/21/13   [provider]  aspirin-acetaminophen-caffeine (EXCEDRIN MIGRAINE) [phone] MG tablet Take 2 tablets by mouth daily as needed. For pain 05/24/14   [provider]  bacitracin ophthalmic ointment SMARTSIG:1 Sparingly Left Eye 4 Times Daily 03/17/20   [provider]  carbamazepine (TEGRETOL) 200 MG tablet Take by mouth.    [provider]  cephALEXin (KEFLEX) 500 MG capsule SMARTSIG:1 Caplet By Mouth 3 Times Daily 12/11/19   [provider]  cephALEXin (KEFLEX) 500 MG capsule TAKE 1 CAPLET BY MOUTH THREE TIMES DAILY FOR 7 DAYS 12/11/19   [provider]  chlorhexidine (PERIDEX) 0.12 % solution 10 mLs 4 (four) times daily. 05/17/20   [provider]  cloNIDine (CATAPRES) 0.2 MG tablet Take 0.2 mg by mouth 2 (two) times daily.    [provider]  cloNIDine (CATAPRES) 0.2 MG tablet Take 1 tablet by mouth 2 (two) times daily. 05/14/16   [provider]  cyclobenzaprine (FLEXERIL) 10 MG tablet Take by mouth. 10/16/18   [provider]  diclofenac Sodium (VOLTAREN) 1 % GEL Apply 2 g topically 4 (four) times daily. 04/18/20   [provider]  DULoxetine (CYMBALTA) 30 MG capsule Take by mouth.    [provider]  DULoxetine (CYMBALTA) 60 MG capsule Take by mouth. 04/25/20 07/24/20  [provider]  DULoxetine (CYMBALTA) 60 MG capsule Take by mouth. 05/16/20   [provider]  erythromycin ophthalmic ointment PLACE INTO THE LEFT EYE 3  TIMES DAILY 04/16/18   [provider]  erythromycin ophthalmic ointment SMARTSIG:In Eye(s) 05/10/20   [provider]  ferrous sulfate 325 (65 FE) MG tablet Take 1 tablet by mouth daily.    [provider]   Fluticasone Furoate-Vilanterol (BREO ELLIPTA) 100-25 MCG/INH AEPB Inhale 1 puff into the lungs daily. 04/25/14   [provider]  ipratropium-albuterol (DUONEB) 0.5-2.5 (3) MG/3ML SOLN Take 3 mLs by nebulization 4 (four) times daily.    [provider]  labetalol (NORMODYNE) 200 MG tablet Take 200 mg by mouth 2 (two) times daily.    [provider]  labetalol (NORMODYNE) 200 MG tablet Take 1 tablet by mouth 2 (two) times daily. 08/30/16   [provider]  lisinopril (PRINIVIL,ZESTRIL) 40 MG tablet Take 40 mg by mouth daily. 01/31/14 01/31/15  [provider]  methylPREDNISolone (MEDROL DOSEPAK) 4 MG TBPK tablet As directed 10/22/15   Rancour, Jeannett Senior, MD  morphine (MSIR) 15 MG tablet Take by mouth. 05/08/20   [provider]  moxifloxacin (VIGAMOX) 0.5 % ophthalmic solution Place 1 drop into the left eye 4 (four) times daily. 03/17/20   [provider]  mupirocin ointment (BACTROBAN) 2 % Apply topically. 03/25/16   [provider]  mupirocin ointment (BACTROBAN) 2 % Apply topically 3 (three) times daily. 03/18/20   [provider]  Naldemedine Tosylate (SYMPROIC) 0.2 MG TABS Take by mouth. 08/02/19   [provider]  naloxone [facility]) nasal spray 4 mg/0.1 mL Place into the nose. 09/17/18   [provider]  naproxen (NAPROSYN) 500 MG tablet Take by mouth. 06/06/18   [provider]  ondansetron (ZOFRAN) 4 MG tablet Take 4 mg by mouth every 8 (eight) hours as needed.    [provider]  orphenadrine (NORFLEX) 100 MG tablet Take by mouth. 10/06/19   [provider]  orphenadrine (NORFLEX) 100 MG tablet Take 100 mg by mouth 2 (two) times daily. 05/03/20   [provider]  oxcarbazepine (TRILEPTAL) 600 MG tablet  11/09/19   [provider]  oxcarbazepine (TRILEPTAL) 600 MG tablet Take by mouth. 04/02/20   [provider]  OXERVATE 0.002 % SOLN Apply to eye. 05/26/20    [provider]  Oxycodone HCl 10 MG TABS TK 1-2 TS PO Q 6 H PRF PAIN. MAX OF 8 TABLETS DAILY 05/28/16   [provider]  oxyCODONE-acetaminophen (PERCOCET/ROXICET) 5-325 MG tablet Take 1 tablet by mouth every 6 (six) hours as needed for severe pain. 05/31/20   Burgess Amor, PA-C  pantoprazole (PROTONIX) 40 MG tablet  06/24/19   [provider]  pantoprazole (PROTONIX) 40 MG tablet Take 1 tablet by mouth daily. 03/25/20   [provider]  Potassium Chloride ER 20 MEQ TBCR Take 20 mEq by mouth daily. 11/23/14   [provider]  pravastatin (PRAVACHOL) 40 MG tablet Take 40 mg by mouth at bedtime. 11/28/14 11/28/15  [provider]  prednisoLONE acetate (PRED FORTE) 1 % ophthalmic suspension 1 drop 4 (four) times daily. 03/01/20   [provider]  promethazine (PHENERGAN) 25 MG tablet Take 1 tablet (25 mg total) by mouth every 6 (six) hours as needed for nausea or vomiting. 07/21/16   Eber Hong, MD  rOPINIRole (REQUIP) 2 MG tablet  Take 2 mg by mouth 2 (two) times daily. 11/11/14   [provider]  simethicone (GAS RELIEF EXTRA STRENGTH) 125 MG chewable tablet Chew 125 mg by mouth daily as needed for flatulence.  05/24/14   [provider]  tapentadol (NUCYNTA) 100 MG 12 hr tablet Take by mouth.    [provider]  tapentadol (NUCYNTA) 50 MG tablet Take by mouth.    [provider]  tizanidine (ZANAFLEX) 2 MG capsule Take by mouth.    [provider]  topiramate (TOPAMAX) 25 MG tablet Take 50 mg by mouth at bedtime. 11/24/14   [provider]  traMADol (ULTRAM) 50 MG tablet Take 50 mg by mouth every 6 (six) hours as needed. pain 02/24/14   [provider]  zolpidem (AMBIEN) 10 MG tablet Take by mouth. 09/18/18   [provider]  zolpidem (AMBIEN) 10 MG tablet Take 10 mg by mouth at bedtime as needed. 05/02/20   [provider]      Allergies    Buprenorphine hcl,  Bupropion, Codeine, Pregabalin, Cymbalta [duloxetine hcl], Diclofenac, Diclofenac sodium, Diclofenac sodium, Gabapentin, Nsaids, Sulfa antibiotics, Clindamycin, Clindamycin/lincomycin, and Toradol [ketorolac tromethamine]    Review of Systems   Review of Systems  Gastrointestinal:  Positive for nausea and vomiting.  Neurological:  Positive for headaches.  All other systems reviewed and are negative.   Physical Exam Updated Vital Signs BP (!) 167/91   Pulse 86   Temp 99.2 F (37.3 C)   Resp 14   SpO2 97%  Physical Exam Vitals and nursing note reviewed.  Constitutional:      General: She is not in acute distress.    Appearance: She is well-developed. She is not ill-appearing, toxic-appearing or diaphoretic.  HENT:     Head: Normocephalic and atraumatic.  Eyes:     Conjunctiva/sclera: Conjunctivae normal.     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  Cardiovascular:     Rate and Rhythm: Normal rate and regular rhythm.  Pulmonary:     Effort: Pulmonary effort is normal. No respiratory distress.  Abdominal:     General: There is no distension.     Palpations: Abdomen is soft.  Musculoskeletal:        General: Normal range of motion.     Cervical back: Neck supple.  Skin:    General: Skin is warm and dry.  Neurological:     Mental Status: She is alert and oriented to person, place, and time.     Cranial Nerves: Facial asymmetry (Chronic) present.  Psychiatric:        Mood and Affect: Mood normal.        Behavior: Behavior normal.     ED Results / Procedures / Treatments   Labs (all labs ordered are listed, but only abnormal results are displayed) Labs Reviewed - No data to display  EKG None  Radiology No results found.  Procedures Procedures    Medications Ordered in ED Medications  tetracaine (PONTOCAINE) 0.5 % ophthalmic solution 1 drop (has no administration in time range)  amLODipine (NORVASC) tablet 10 mg (10 mg Oral Given 04/01/23 2002)  cloNIDine  (CATAPRES) tablet 0.2 mg (0.2 mg Oral Given 04/01/23 2002)  labetalol (NORMODYNE) tablet 200 mg (200 mg Oral Given 04/01/23 2002)  HYDROmorphone (DILAUDID) injection 1 mg (1 mg Intravenous Given 04/01/23 2002)  ondansetron (ZOFRAN) injection 4 mg (4 mg Intravenous Given 04/01/23 2003)  lactated ringers bolus 1,000 mL (0 mLs Intravenous Stopped 04/01/23 2123)  prochlorperazine (  COMPAZINE) injection 10 mg (10 mg Intravenous Given 04/01/23 2003)  diphenhydrAMINE (BENADRYL) injection 12.5 mg (12.5 mg Intravenous Given 04/01/23 2003)    ED Course/ Medical Decision Making/ A&P                                 Medical Decision Making Risk Prescription drug management.   This patient presents to the ED for concern of headache and nausea, this involves an extensive number of treatment options, and is a complaint that carries with it a high risk of complications and morbidity.  The differential diagnosis includes chronic pain, medication withdrawal, migraine headache, trigeminal neuralgia, acute glaucoma   Co morbidities that complicate the patient evaluation  anemia, anxiety, brain AVM, arthritis, HLD, migraine headaches, RLS, CKD, prediabetes, GERD, IBD, depression, trigeminal neuralgia   Additional history obtained:  Additional history obtained from N/A External records from outside source obtained and reviewed including EMR  Cardiac Monitoring: / EKG:  The patient was maintained on a cardiac monitor.  I personally viewed and interpreted the cardiac monitored which showed an underlying rhythm of: Sinus rhythm  Problem List / ED Course / Critical interventions / Medication management  Patient presents for left-sided head and face pain.  This is consistent with her chronic pain but worsened today.  She was seen by pain management specialist at [facility] earlier today.  Plan is for new medications but she has to await insurance clearance for these.  Vital signs on arrival notable for hypertension.   Patient has history of the same.  She states that she took her blood pressure medications today but subsequently vomited.  Home blood pressure medications were ordered in the ED.  Multimodal pain control was ordered for her headache.  Given no changes to her pain and recent workup here in the ED, will defer workup for now.  IOP in left eye is normal.  Patient has no visual complaints in right eye.  On reassessment, patient had improved symptoms and improved blood pressure while in the ED.  She does request discharge home at this time.  She was discharged in stable condition. I ordered medication including IV fluids for hydration; Dilaudid, Compazine, Benadryl for headache; Zofran for nausea; labetalol, clonidine, amlodipine for hypertension Reevaluation of the patient after these medicines showed that the patient improved I have reviewed the patients home medicines and have made adjustments as needed   Social Determinants of Health:  Has access to outpatient care        Final Clinical Impression(s) / ED Diagnoses Final diagnoses:  Bad headache  Left facial pain  Hypertension, unspecified type    Rx / DC Orders ED Discharge Orders     None         Gloris Manchester, MD 04/01/23 2238

## 2023-04-01 NOTE — Discharge Instructions (Signed)
Continue home medications as prescribed.  Continue to follow-up with your outpatient providers.  Return to the emergency department for any new or worsening symptoms of concern.

## 2023-05-13 ENCOUNTER — Observation Stay (HOSPITAL_COMMUNITY)

## 2023-05-13 ENCOUNTER — Other Ambulatory Visit: Payer: Self-pay

## 2023-05-13 ENCOUNTER — Encounter (HOSPITAL_COMMUNITY): Payer: Self-pay | Admitting: Emergency Medicine

## 2023-05-13 ENCOUNTER — Inpatient Hospital Stay (HOSPITAL_COMMUNITY)
Admission: EM | Admit: 2023-05-13 | Discharge: 2023-05-16 | DRG: 074 | Disposition: A | Discharge status: Home / Self Care | Attending: Family Medicine | Admitting: Family Medicine

## 2023-05-13 DIAGNOSIS — R633 Feeding difficulties, unspecified: Secondary | ICD-10-CM | POA: Diagnosis present

## 2023-05-13 DIAGNOSIS — I160 Hypertensive urgency: Secondary | ICD-10-CM | POA: Diagnosis present

## 2023-05-13 DIAGNOSIS — K589 Irritable bowel syndrome without diarrhea: Secondary | ICD-10-CM | POA: Diagnosis present

## 2023-05-13 DIAGNOSIS — R000 Tachycardia, unspecified: Secondary | ICD-10-CM | POA: Diagnosis present

## 2023-05-13 DIAGNOSIS — E876 Hypokalemia: Secondary | ICD-10-CM | POA: Diagnosis present

## 2023-05-13 DIAGNOSIS — Z7951 Long term (current) use of inhaled steroids: Secondary | ICD-10-CM

## 2023-05-13 DIAGNOSIS — I10 Essential (primary) hypertension: Principal | ICD-10-CM | POA: Diagnosis present

## 2023-05-13 DIAGNOSIS — Z882 Allergy status to sulfonamides status: Secondary | ICD-10-CM

## 2023-05-13 DIAGNOSIS — R531 Weakness: Secondary | ICD-10-CM

## 2023-05-13 DIAGNOSIS — Z881 Allergy status to other antibiotic agents status: Secondary | ICD-10-CM

## 2023-05-13 DIAGNOSIS — Z791 Long term (current) use of non-steroidal anti-inflammatories (NSAID): Secondary | ICD-10-CM

## 2023-05-13 DIAGNOSIS — G500 Trigeminal neuralgia: Secondary | ICD-10-CM | POA: Diagnosis not present

## 2023-05-13 DIAGNOSIS — K219 Gastro-esophageal reflux disease without esophagitis: Secondary | ICD-10-CM | POA: Diagnosis present

## 2023-05-13 DIAGNOSIS — M797 Fibromyalgia: Secondary | ICD-10-CM | POA: Diagnosis present

## 2023-05-13 DIAGNOSIS — I1A0 Resistant hypertension: Secondary | ICD-10-CM | POA: Diagnosis present

## 2023-05-13 DIAGNOSIS — F419 Anxiety disorder, unspecified: Secondary | ICD-10-CM | POA: Diagnosis present

## 2023-05-13 DIAGNOSIS — E875 Hyperkalemia: Secondary | ICD-10-CM

## 2023-05-13 DIAGNOSIS — G4700 Insomnia, unspecified: Secondary | ICD-10-CM | POA: Diagnosis present

## 2023-05-13 DIAGNOSIS — G8929 Other chronic pain: Secondary | ICD-10-CM | POA: Diagnosis present

## 2023-05-13 DIAGNOSIS — Z87891 Personal history of nicotine dependence: Secondary | ICD-10-CM

## 2023-05-13 DIAGNOSIS — Z7982 Long term (current) use of aspirin: Secondary | ICD-10-CM

## 2023-05-13 DIAGNOSIS — Z886 Allergy status to analgesic agent status: Secondary | ICD-10-CM

## 2023-05-13 DIAGNOSIS — H5789 Other specified disorders of eye and adnexa: Secondary | ICD-10-CM | POA: Diagnosis present

## 2023-05-13 DIAGNOSIS — H169 Unspecified keratitis: Secondary | ICD-10-CM | POA: Diagnosis present

## 2023-05-13 DIAGNOSIS — Z90710 Acquired absence of both cervix and uterus: Secondary | ICD-10-CM

## 2023-05-13 DIAGNOSIS — Z885 Allergy status to narcotic agent status: Secondary | ICD-10-CM

## 2023-05-13 DIAGNOSIS — M1990 Unspecified osteoarthritis, unspecified site: Secondary | ICD-10-CM | POA: Diagnosis present

## 2023-05-13 DIAGNOSIS — Z888 Allergy status to other drugs, medicaments and biological substances status: Secondary | ICD-10-CM

## 2023-05-13 DIAGNOSIS — Z79891 Long term (current) use of opiate analgesic: Secondary | ICD-10-CM

## 2023-05-13 DIAGNOSIS — Z79899 Other long term (current) drug therapy: Secondary | ICD-10-CM

## 2023-05-13 DIAGNOSIS — Z8679 Personal history of other diseases of the circulatory system: Secondary | ICD-10-CM

## 2023-05-13 DIAGNOSIS — G2581 Restless legs syndrome: Secondary | ICD-10-CM | POA: Diagnosis present

## 2023-05-13 DIAGNOSIS — E785 Hyperlipidemia, unspecified: Secondary | ICD-10-CM | POA: Diagnosis present

## 2023-05-13 LAB — HEPATIC FUNCTION PANEL
ALT: 14 U/L (ref 0–44)
AST: 22 U/L (ref 15–41)
Albumin: 3.9 g/dL (ref 3.5–5.0)
Alkaline Phosphatase: 53 U/L (ref 38–126)
Bilirubin, Direct: <0.1 mg/dL (ref 0.0–0.2)
Total Bilirubin: 0.5 mg/dL (ref 0.0–1.2)
Total Protein: 6.7 g/dL (ref 6.5–8.1)

## 2023-05-13 LAB — BASIC METABOLIC PANEL
Anion gap: 14 (ref 5–15)
BUN: 19 mg/dL (ref 8–23)
CO2: 26 mmol/L (ref 22–32)
Calcium: 9.5 mg/dL (ref 8.9–10.3)
Chloride: 101 mmol/L (ref 98–111)
Creatinine, Ser: 0.85 mg/dL (ref 0.44–1.00)
GFR, Estimated: >60 mL/min (ref >60)
Glucose, Bld: 107 mg/dL — ABNORMAL HIGH (ref 70–99)
Potassium: 2.5 mmol/L — CL (ref 3.5–5.1)
Sodium: 141 mmol/L (ref 135–145)

## 2023-05-13 LAB — CBC WITH DIFFERENTIAL/PLATELET
Abs Immature Granulocytes: 0.03 10*3/uL (ref 0.00–0.07)
Basophils Absolute: 0.1 10*3/uL (ref 0.0–0.1)
Basophils Relative: 1 %
Eosinophils Absolute: 0 10*3/uL (ref 0.0–0.5)
Eosinophils Relative: 1 %
HCT: 42.9 % (ref 36.0–46.0)
Hemoglobin: 14 g/dL (ref 12.0–15.0)
Immature Granulocytes: 0 %
Lymphocytes Relative: 35 %
Lymphs Abs: 2.6 10*3/uL (ref 0.7–4.0)
MCH: 28.1 pg (ref 26.0–34.0)
MCHC: 32.6 g/dL (ref 30.0–36.0)
MCV: 86.1 fL (ref 80.0–100.0)
Monocytes Absolute: 0.4 10*3/uL (ref 0.1–1.0)
Monocytes Relative: 6 %
Neutro Abs: 4.1 10*3/uL (ref 1.7–7.7)
Neutrophils Relative %: 57 %
Platelets: 266 10*3/uL (ref 150–400)
RBC: 4.98 MIL/uL (ref 3.87–5.11)
RDW: 13.5 % (ref 11.5–15.5)
WBC: 7.3 10*3/uL (ref 4.0–10.5)
nRBC: 0 % (ref 0.0–0.2)

## 2023-05-13 LAB — MAGNESIUM: Magnesium: 1.7 mg/dL (ref 1.7–2.4)

## 2023-05-13 MED ORDER — CLONIDINE HCL 0.2 MG PO TABS
0.2000 mg | ORAL_TABLET | Freq: Once | ORAL | Status: AC
  Administered 2023-05-13: 0.2 mg via ORAL
  Filled 2023-05-13: qty 1

## 2023-05-13 MED ORDER — HYDRALAZINE HCL 20 MG/ML IJ SOLN
10.0000 mg | Freq: Once | INTRAMUSCULAR | Status: AC
  Administered 2023-05-13: 10 mg via INTRAVENOUS
  Filled 2023-05-13: qty 1

## 2023-05-13 MED ORDER — SENNOSIDES-DOCUSATE SODIUM 8.6-50 MG PO TABS
2.0000 | ORAL_TABLET | Freq: Two times a day (BID) | ORAL | Status: DC
  Administered 2023-05-14 – 2023-05-15 (×4): 2 via ORAL
  Filled 2023-05-13 (×6): qty 2

## 2023-05-13 MED ORDER — HYDROMORPHONE HCL 1 MG/ML IJ SOLN
1.0000 mg | Freq: Once | INTRAMUSCULAR | Status: AC
  Administered 2023-05-13: 1 mg via INTRAVENOUS
  Filled 2023-05-13: qty 1

## 2023-05-13 MED ORDER — SODIUM CHLORIDE 0.9 % IV BOLUS
1000.0000 mL | Freq: Once | INTRAVENOUS | Status: AC
  Administered 2023-05-13: 1000 mL via INTRAVENOUS

## 2023-05-13 MED ORDER — POTASSIUM CHLORIDE 10 MEQ/100ML IV SOLN
10.0000 meq | INTRAVENOUS | Status: AC
  Administered 2023-05-13 (×3): 10 meq via INTRAVENOUS
  Filled 2023-05-13 (×3): qty 100

## 2023-05-13 MED ORDER — ENSURE ENLIVE PO LIQD
237.0000 mL | Freq: Two times a day (BID) | ORAL | Status: DC
  Administered 2023-05-14: 237 mL via ORAL

## 2023-05-13 MED ORDER — SODIUM CHLORIDE 0.9 % IV SOLN
5.0000 mg/kg | Freq: Once | INTRAVENOUS | Status: DC
Start: 2023-05-13 — End: 2023-05-15
  Filled 2023-05-13: qty 4.76

## 2023-05-13 MED ORDER — AMLODIPINE BESYLATE 5 MG PO TABS
10.0000 mg | ORAL_TABLET | Freq: Once | ORAL | Status: AC
  Administered 2023-05-13: 10 mg via ORAL
  Filled 2023-05-13: qty 2

## 2023-05-13 MED ORDER — HYDRALAZINE HCL 20 MG/ML IJ SOLN: 10.0000 mg | INTRAMUSCULAR | Status: DC | PRN

## 2023-05-13 MED ORDER — ACETAMINOPHEN 325 MG PO TABS
650.0000 mg | ORAL_TABLET | ORAL | Status: DC
  Administered 2023-05-14 – 2023-05-16 (×16): 650 mg via ORAL
  Filled 2023-05-13 (×16): qty 2

## 2023-05-13 MED ORDER — HYDROMORPHONE HCL 1 MG/ML IJ SOLN
0.5000 mg | INTRAMUSCULAR | Status: DC | PRN
  Administered 2023-05-14 – 2023-05-15 (×12): 0.5 mg via INTRAVENOUS
  Filled 2023-05-13 (×12): qty 0.5

## 2023-05-13 MED ORDER — SODIUM CHLORIDE 0.9% FLUSH
3.0000 mL | Freq: Two times a day (BID) | INTRAVENOUS | Status: DC
  Administered 2023-05-14 – 2023-05-16 (×5): 3 mL via INTRAVENOUS

## 2023-05-13 MED ORDER — POTASSIUM CHLORIDE 20 MEQ PO PACK
60.0000 meq | PACK | ORAL | Status: AC
  Administered 2023-05-14 (×2): 60 meq via ORAL
  Filled 2023-05-13 (×2): qty 3

## 2023-05-13 MED ORDER — MORPHINE SULFATE 15 MG PO TABS
15.0000 mg | ORAL_TABLET | ORAL | Status: DC | PRN
  Administered 2023-05-15 – 2023-05-16 (×5): 15 mg via ORAL
  Filled 2023-05-13 (×5): qty 1

## 2023-05-13 MED ORDER — POTASSIUM CHLORIDE CRYS ER 20 MEQ PO TBCR
40.0000 meq | EXTENDED_RELEASE_TABLET | Freq: Once | ORAL | Status: AC
  Administered 2023-05-13: 40 meq via ORAL
  Filled 2023-05-13: qty 2

## 2023-05-13 MED ORDER — LABETALOL HCL 5 MG/ML IV SOLN
20.0000 mg | INTRAVENOUS | Status: DC | PRN
  Administered 2023-05-14: 20 mg via INTRAVENOUS
  Filled 2023-05-13: qty 4

## 2023-05-13 MED ORDER — TOPIRAMATE 25 MG PO TABS
50.0000 mg | ORAL_TABLET | ORAL | Status: AC
  Administered 2023-05-14: 50 mg via ORAL
  Filled 2023-05-13: qty 2

## 2023-05-13 MED ORDER — ENOXAPARIN SODIUM 40 MG/0.4ML IJ SOSY
40.0000 mg | PREFILLED_SYRINGE | INTRAMUSCULAR | Status: DC
  Administered 2023-05-14 – 2023-05-16 (×3): 40 mg via SUBCUTANEOUS
  Filled 2023-05-13 (×3): qty 0.4

## 2023-05-13 NOTE — Assessment & Plan Note (Signed)
 Is chronic, however patient is having exacerbation for the last 3 days.  At this time I will check a sed rate and CRP given that the patient's pain is worse with chewing.  Therefore we will want to make sure were not dealing with a temporal arteritis.  I did not appreciate any focal tenderness on patient's scalp.  I will also perform a head CT and an EEG to make sure were not missing any seizure-like activity.  For symptom control we will continue with patient's morphine 15 mg every 4 hourly as needed.  I will give her Dilaudid for breakthrough pain.  I will also give her a single dose of fosphenytoin 5 mg/kg and topiramate 50 mg now.

## 2023-05-13 NOTE — H&P (Addendum)
 History and Physical    Patient: Renee Gutierrez ZOX:[id number] DOB: October 30, 1960 DOA: 05/13/2023 DOS: the patient was seen and examined on 05/13/2023 PCP: Miles Costain, NP  Patient coming from: Home  Chief Complaint:  Chief Complaint  Patient presents with   Hypertension   Near Syncope   HPI: Renee Gutierrez is a 63 y.o. female with medical history significant of reported AVM that was surgically repaird at [facility]. Unfortuantely it has been complicated by trigeminal neuralgia that is also managed at [facility]. Patient has chronic left hemifacial pain chronically for which she uses morphine, among other medicatios. However, it tends to flare up occasionally which makes management very challenging.  Patient reports being in her usual state of health till approximately 3 days ago when she stays that her left hemifacial brain, which she depicts going from all the way to the temporal zone to the angle of the mandible towards the chin with her left hand, got worse and unbearable.  It was especially worse whenever patient would attempt to chew.  Patient does not report any vision changes any new trauma.  Given difficulty eating, patient reports marked sensation of her eating and drinking.  Although she is still able to tolerate liquid somewhat.  Patient denies any vomiting or diarrhea.  Patient developed progressive generalized weakness.  Patient tends to have chronic falls, and reports that in the last 24 hours her falls became much worse.  However she denies any trauma.  She had only left arm discomfort/trauma but she is still able to use the left arm.  Ultimately patient came to the ER because of generalized weakness and severe pain interfering with her ability to tolerate nutrition.  Here in the ER workup is notable for patient having hypertension, which is presumably due to patient having skipped her blood pressure medications as well, as well as severe hypokalemia.  Repletion has been started.  Medical  evaluation is sought.  No report of any chest pain fever or any new weakness of an arm or leg in particular, no seizure like activity. Patint has chronic left eye irritation since the surgery of her AVM with associated ptosis. Review of Systems: As mentioned in the history of present illness. All other systems reviewed and are negative. 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 Her smoking use included cigarettes. She has never used smokeless tobacco. She reports that she does not drink alcohol and does not  use drugs.  Allergies  Allergen Reactions   Buprenorphine Hcl Swelling    All extremeties and neck and face swelled up huge according to patient   Bupropion Other (See Comments)    Moody and depressed   Codeine Itching, Nausea And Vomiting, Swelling and Nausea Only    Other reaction(s): Itching, Nausea And Vomiting, Swelling    Pregabalin Swelling    Other reaction(s): Swelling    Cymbalta [Duloxetine Hcl] Swelling   Diclofenac Swelling    cream    Diclofenac Sodium Swelling    Oral med   Diclofenac Sodium Swelling    Oral med Oral med Oral med    Gabapentin Swelling     Other reaction(s): Swelling    Nsaids    Sulfa Antibiotics    Clindamycin Other (See Comments)    Heart burn   Clindamycin/Lincomycin Rash and Other (See Comments)    Heart burn Heart burn Other reaction(s): Other (See Comments) Heart burn  Other reaction(s): Other (See Comments) Heart burn    Toradol [Ketorolac Tromethamine] Rash    History reviewed. No pertinent family history.  Prior to Admission medications   Medication Sig Start Date End Date Taking? Authorizing Provider  amLODipine (NORVASC) 10 MG tablet Take 1 tablet by mouth daily. 04/10/20   [provider]  amLODipine (NORVASC) 10 MG tablet Take by mouth. 04/09/20   [provider]  amLODipine (NORVASC) 5 MG tablet Take 5 mg by mouth daily.     [provider]  amLODipine (NORVASC) 5 MG tablet Take 1 tablet by mouth daily. 02/12/17   [provider]  aspirin EC 81 MG tablet Take 81 mg by mouth daily.  09/21/13   [provider]  aspirin-acetaminophen-caffeine (EXCEDRIN MIGRAINE) [phone] MG tablet Take 2 tablets by mouth daily as needed. For pain 05/24/14   [provider]  bacitracin ophthalmic ointment SMARTSIG:1 Sparingly Left Eye 4 Times Daily 03/17/20   [provider]  carbamazepine (TEGRETOL) 200 MG tablet Take by mouth.    [provider]  cephALEXin (KEFLEX) 500 MG capsule SMARTSIG:1 Caplet By Mouth 3 Times Daily 12/11/19   [provider]  cephALEXin (KEFLEX) 500 MG capsule TAKE 1 CAPLET BY MOUTH THREE TIMES DAILY FOR 7 DAYS 12/11/19   [provider]  chlorhexidine (PERIDEX) 0.12 % solution 10 mLs 4 (four) times daily. 05/17/20   [provider]  cloNIDine (CATAPRES) 0.2 MG tablet Take 0.2 mg by mouth 2 (two) times daily.    [provider]  cloNIDine (CATAPRES) 0.2 MG tablet Take 1 tablet by mouth 2 (two) times daily. 05/14/16   [provider]  cyclobenzaprine (FLEXERIL) 10 MG tablet Take by  mouth. 10/16/18   [provider]  diclofenac Sodium (VOLTAREN) 1 % GEL Apply 2 g topically 4 (four) times daily. 04/18/20   [provider]  DULoxetine (CYMBALTA) 30 MG capsule Take by mouth.    [provider]  DULoxetine (CYMBALTA) 60 MG capsule Take by mouth. 04/25/20 07/24/20  [provider]  DULoxetine (CYMBALTA) 60 MG capsule Take by mouth. 05/16/20   [provider]  erythromycin ophthalmic ointment PLACE INTO THE LEFT EYE 3  TIMES DAILY 04/16/18   [provider]  erythromycin ophthalmic ointment SMARTSIG:In Eye(s) 05/10/20   [provider]  ferrous sulfate 325 (65 FE) MG tablet Take 1 tablet by mouth daily.    [provider]  Fluticasone Furoate-Vilanterol (BREO ELLIPTA) 100-25 MCG/INH AEPB Inhale 1 puff into the lungs daily.  04/25/14   [provider]  ipratropium-albuterol (DUONEB) 0.5-2.5 (3) MG/3ML SOLN Take 3 mLs by nebulization 4 (four) times daily.    [provider]  labetalol (NORMODYNE) 200 MG tablet Take 200 mg by mouth 2 (two) times daily.    [provider]  labetalol (NORMODYNE) 200 MG tablet Take 1 tablet by mouth 2 (two) times daily. 08/30/16   [provider]  lisinopril (PRINIVIL,ZESTRIL) 40 MG tablet Take 40 mg by mouth daily. 01/31/14 01/31/15  [provider]  methylPREDNISolone (MEDROL DOSEPAK) 4 MG TBPK tablet As directed 10/22/15   Rancour, Jeannett Senior, MD  morphine (MSIR) 15 MG tablet Take by mouth. 05/08/20   [provider]  moxifloxacin (VIGAMOX) 0.5 % ophthalmic solution Place 1 drop into the left eye 4 (four) times daily. 03/17/20   [provider]  mupirocin ointment (BACTROBAN) 2 % Apply topically. 03/25/16   [provider]  mupirocin ointment (BACTROBAN) 2 % Apply topically 3 (three) times daily. 03/18/20   [provider]  Naldemedine Tosylate (SYMPROIC) 0.2 MG TABS Take by mouth. 08/02/19   [provider]   naloxone [facility]) nasal spray 4 mg/0.1 mL Place into the nose. 09/17/18   [provider]  naproxen (NAPROSYN) 500 MG tablet Take by mouth. 06/06/18   [provider]  ondansetron (ZOFRAN) 4 MG tablet Take 4 mg by mouth every 8 (eight) hours as needed.    [provider]  orphenadrine (NORFLEX) 100 MG tablet Take by mouth. 10/06/19   [provider]  orphenadrine (NORFLEX) 100 MG tablet Take 100 mg by mouth 2 (two) times daily. 05/03/20   [provider]  oxcarbazepine (TRILEPTAL) 600 MG tablet  11/09/19   [provider]  oxcarbazepine (TRILEPTAL) 600 MG tablet Take by mouth. 04/02/20   [provider]  OXERVATE 0.002 % SOLN Apply to eye. 05/26/20   [provider]  Oxycodone HCl 10 MG TABS TK 1-2 TS PO Q 6 H PRF PAIN. MAX OF 8 TABLETS DAILY 05/28/16   [provider]  oxyCODONE-acetaminophen (PERCOCET/ROXICET) 5-325 MG tablet Take 1 tablet by mouth every 6 (six) hours as needed for severe pain. 05/31/20   Burgess Amor, PA-C  pantoprazole (PROTONIX) 40 MG tablet  06/24/19   [provider]  pantoprazole (PROTONIX) 40 MG tablet Take 1 tablet by mouth daily. 03/25/20   [provider]  Potassium Chloride ER 20 MEQ TBCR Take 20 mEq by mouth daily. 11/23/14   [provider]  pravastatin (PRAVACHOL) 40 MG tablet Take 40 mg by mouth at bedtime. 11/28/14 11/28/15  [provider]  prednisoLONE acetate (PRED FORTE) 1 % ophthalmic suspension 1 drop 4 (four) times daily. 03/01/20   [provider]  promethazine (PHENERGAN) 25 MG tablet Take 1 tablet (25 mg total) by mouth every 6 (six) hours as needed for nausea or vomiting. 07/21/16   Eber Hong, MD  rOPINIRole (REQUIP) 2 MG tablet Take 2 mg by mouth 2 (two) times daily. 11/11/14   [provider]  simethicone (GAS RELIEF EXTRA STRENGTH) 125 MG chewable tablet Chew 125 mg by mouth daily as needed for flatulence.  05/24/14   [provider]  tapentadol (NUCYNTA) 100 MG 12 hr tablet Take by mouth.    [provider]  tapentadol (NUCYNTA) 50 MG tablet Take by mouth.    [provider]  tizanidine (ZANAFLEX) 2 MG capsule Take by mouth.    [provider]  topiramate (TOPAMAX) 25 MG tablet  Take 50 mg by mouth at bedtime. 11/24/14   [provider]  traMADol (ULTRAM) 50 MG tablet Take 50 mg by mouth every 6 (six) hours as needed. pain 02/24/14   [provider]  zolpidem (AMBIEN) 10 MG tablet Take by mouth. 09/18/18   [provider]  zolpidem (AMBIEN) 10 MG tablet Take 10 mg by mouth at bedtime as needed. 05/02/20   [provider]    Physical Exam: Vitals:   05/13/23 2045 05/13/23 2048 05/13/23 2146 05/13/23 2200  BP: (!) 220/110 (!) 211/99 (!) 192/112 (!) 156/82  Pulse:      Resp:      Temp:      TempSrc:      SpO2:      Weight:      Height:       General: Patient in mild distress from pain, otherwise no physiologic distress.  Patient is alert and awake and gives a fully coherent account of her symptoms Husband at the bedside Respiratory exam: Bilateral intravesicular Cardiovascular exam S1-S2 normal Abdomen all quadrants are soft nontender Extremities warm without edema No focal motor deficit of any extremities noted.  There is mild facial asymmetry with mild left facial droop, left eye Shut and when patient protrudes to truncated goes towards the left.  However on specific facial muscle testing mandibular testing as well as asking the patient to move the tongue from side-to-side, I do not appreciate any focal weakness. Data Reviewed:  Labs on Admission:  Results for orders placed or performed during the hospital encounter of 05/13/23 (from the past 24 hours)  CBC with Differential     Status: None   Collection Time: 05/13/23  6:30 PM  Result Value Ref Range   WBC 7.3 4.0 - 10.5 K/uL   RBC 4.98 3.87 - 5.11 MIL/uL   Hemoglobin 14.0 12.0 - 15.0  g/dL   HCT 16.1 09.6 - 04.5 %   MCV 86.1 80.0 - 100.0 fL   MCH 28.1 26.0 - 34.0 pg   MCHC 32.6 30.0 - 36.0 g/dL   RDW 40.9 81.1 - 91.4 %   Platelets 266 150 - 400 K/uL   nRBC 0.0 0.0 - 0.2 %   Neutrophils Relative % 57 %   Neutro Abs 4.1 1.7 - 7.7 K/uL   Lymphocytes Relative 35 %   Lymphs Abs 2.6 0.7 - 4.0 K/uL   Monocytes Relative 6 %   Monocytes Absolute 0.4 0.1 - 1.0 K/uL   Eosinophils Relative 1 %   Eosinophils Absolute 0.0 0.0 - 0.5 K/uL   Basophils Relative 1 %   Basophils Absolute 0.1 0.0 - 0.1 K/uL   Immature Granulocytes 0 %   Abs Immature Granulocytes 0.03 0.00 - 0.07 K/uL  Basic metabolic panel     Status: Abnormal   Collection Time: 05/13/23  6:30 PM  Result Value Ref Range   Sodium 141 135 - 145 mmol/L   Potassium 2.5 (LL) 3.5 - 5.1 mmol/L   Chloride 101 98 - 111 mmol/L   CO2 26 22 - 32 mmol/L   Glucose, Bld 107 (H) 70 - 99 mg/dL   BUN 19 8 - 23 mg/dL   Creatinine, Ser 7.82 0.44 - 1.00 mg/dL   Calcium 9.5 8.9 - 95.6 mg/dL   GFR, Estimated >21 >30 mL/min   Anion gap 14 5 - 15  Magnesium     Status: None   Collection Time: 05/13/23  6:30 PM  Result Value Ref Range  Magnesium 1.7 1.7 - 2.4 mg/dL   Basic Metabolic Panel: Recent Labs  Lab 05/13/23 1830  NA 141  K 2.5*  CL 101  CO2 26  GLUCOSE 107*  BUN 19  CREATININE 0.85  CALCIUM 9.5  MG 1.7   Liver Function Tests: No results for input(s): "AST", "ALT", "ALKPHOS", "BILITOT", "PROT", "ALBUMIN" in the last 168 hours. No results for input(s): "LIPASE", "AMYLASE" in the last 168 hours. No results for input(s): "AMMONIA" in the last 168 hours. CBC: Recent Labs  Lab 05/13/23 1830  WBC 7.3  NEUTROABS 4.1  HGB 14.0  HCT 42.9  MCV 86.1  PLT 266   Cardiac Enzymes: No results for input(s): "CKTOTAL", "CKMB", "CKMBINDEX", "TROPONINIHS" in the last 168 hours.  BNP (last 3 results) No results for input(s): "PROBNP" in the last 8760 hours. CBG: No results for input(s): "GLUCAP" in the last 168  hours.  Radiological Exams on Admission:  No results found.   No intake/output data recorded. No intake/output data recorded.     Assessment and Plan: * Trigeminal neuralgia Is chronic, however patient is having exacerbation for the last 3 days.  At this time I will check a sed rate and CRP given that the patient's pain is worse with chewing.  Therefore we will want to make sure were not dealing with a temporal arteritis.  I did not appreciate any focal tenderness on patient's scalp.  I will also perform a head CT and an EEG to make sure were not missing any seizure-like activity.  For symptom control we will continue with patient's morphine 15 mg every 4 hourly as needed.  I will give her Dilaudid for breakthrough pain.  I will also give her a single dose of fosphenytoin 5 mg/kg and topiramate 50 mg now.  Hypokalemia This is severe.  2.8. would explain patinet presentation of generalised weakness.  Magnesium is within normal limits.  Patient treated with 30 mEq IV KCl as well as 40 mg p.o. once.  At approximately 7:30 PM.  I will add on 60 mEq KCl p.o. every 4 hourly for 2 doses.  Given patient's creatinine being normal, I feel we can be liberal with potassium replacement.  Patient does not report any history of enteric losses.  I believe her hypokalemia is due to reduced p.o. intake as in the HPI.  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  Patient did miss some doses of her antihypertensive tensive's at home.  Patient is noted to have pressure up to 220/110 at this time during ER evaluation.  At this time we will continue with as needed agents as principal reason for hypertension is felt to be patient's pain and skipping of antihypertensive regimen.  Plan would be to restart patient oral regimen and see if that brings her blood pressure down along with pain control.   Med rec pending pharmcy input. Diet changed to full liquid as patient is having pain with chewing.   Advance Care Planning:    Code Status: Full Code   Consults: none at this time.  Family Communication: husband at bedside. All questions answred. Everybody agreable to plan as above.  Severity of Illness: The appropriate patient status for this patient is OBSERVATION. Observation status is judged to be reasonable and necessary in order to provide the required intensity of service to ensure the patient's safety. The patient's presenting symptoms, physical exam findings, and initial radiographic and laboratory data in the context of their medical condition is felt to place them at decreased  risk for further clinical deterioration. Furthermore, it is anticipated that the patient will be medically stable for discharge from the hospital within 2 midnights of admission.   Author: Nolberto Hanlon, MD 05/13/2023 10:17 PM  For on call review [email or web address].

## 2023-05-13 NOTE — ED Triage Notes (Signed)
 Pt bib pov w/ c/o high blood pressure, and a fall. Pt reports her legs went weak earlier today and she fell. She reports she hit her arm. Pt says she just feels off.

## 2023-05-13 NOTE — Assessment & Plan Note (Signed)
 This is chronic.  Patient did miss some doses of her antihypertensive tensive's at home.  Patient is noted to have pressure up to 220/110 at this time during ER evaluation.  At this time we will continue with as needed agents as principal reason for hypertension is felt to be patient's pain and skipping of antihypertensive regimen.  Plan would be to restart patient oral regimen and see if that brings her blood pressure down along with pain control.

## 2023-05-13 NOTE — ED Provider Notes (Signed)
 [place] EMERGENCY DEPARTMENT AT [facility] Provider Note   CSN: [id number] Arrival date & time: 05/13/23  1529     History  Chief Complaint  Patient presents with   Hypertension   Near Syncope    Renee Gutierrez is a 63 y.o. female.  Patient complains of weakness.  Patient states that she feels off today.  Patient reports that she fell earlier hitting her arm.  Patient complains of a bruise to her right arm.  Patient did not strike her head.  Patient reports that she has had nausea.  Patient states that she has not been eating or drinking because she is having severe pain.  Patient is currently taking morphine for trigeminal neuralgia.  Patient states that the pain in her face is worse than usual.  Patient states that she has tried to drink fluids but she has not been able to.  Patient reports that she has had similar episodes in the past.  Patient denies any fever or chills she has not had any cough or congestion.  Patient advised denies any abdominal pain. Pt has not taken her blood pressure medications today.    The history is provided by the patient and the spouse. No language interpreter was used.  Hypertension This is a new problem. The problem occurs constantly. The problem has been gradually worsening. Pertinent negatives include no abdominal pain. Nothing aggravates the symptoms. Nothing relieves the symptoms.  Near Syncope Pertinent negatives include no abdominal pain.       Home Medications Prior to Admission medications   Medication Sig Start Date End Date Taking? Authorizing Provider  amLODipine (NORVASC) 10 MG tablet Take 1 tablet by mouth daily. 04/10/20   [provider]  amLODipine (NORVASC) 10 MG tablet Take by mouth. 04/09/20   [provider]  amLODipine (NORVASC) 5 MG tablet Take 5 mg by mouth daily.     [provider]  amLODipine (NORVASC) 5 MG tablet Take 1 tablet by mouth daily. 02/12/17   [provider]   aspirin EC 81 MG tablet Take 81 mg by mouth daily.  09/21/13   [provider]  aspirin-acetaminophen-caffeine (EXCEDRIN MIGRAINE) [phone] MG tablet Take 2 tablets by mouth daily as needed. For pain 05/24/14   [provider]  bacitracin ophthalmic ointment SMARTSIG:1 Sparingly Left Eye 4 Times Daily 03/17/20   [provider]  carbamazepine (TEGRETOL) 200 MG tablet Take by mouth.    [provider]  cephALEXin (KEFLEX) 500 MG capsule SMARTSIG:1 Caplet By Mouth 3 Times Daily 12/11/19   [provider]  cephALEXin (KEFLEX) 500 MG capsule TAKE 1 CAPLET BY MOUTH THREE TIMES DAILY FOR 7 DAYS 12/11/19   [provider]  chlorhexidine (PERIDEX) 0.12 % solution 10 mLs 4 (four) times daily. 05/17/20   [provider]  cloNIDine (CATAPRES) 0.2 MG tablet Take 0.2 mg by mouth 2 (two) times daily.    [provider]  cloNIDine (CATAPRES) 0.2 MG tablet Take 1 tablet by mouth 2 (two) times daily. 05/14/16   [provider]  cyclobenzaprine (FLEXERIL) 10 MG tablet Take by mouth. 10/16/18   [provider]  diclofenac Sodium (VOLTAREN) 1 % GEL Apply 2 g topically 4 (four) times daily. 04/18/20   [provider]  DULoxetine (CYMBALTA) 30 MG capsule Take by mouth.    [provider]  DULoxetine (CYMBALTA) 60 MG capsule Take by mouth. 04/25/20 07/24/20  [provider]  DULoxetine (CYMBALTA) 60 MG capsule Take by  mouth. 05/16/20   [provider]  erythromycin ophthalmic ointment PLACE INTO THE LEFT EYE 3  TIMES DAILY 04/16/18   [provider]  erythromycin ophthalmic ointment SMARTSIG:In Eye(s) 05/10/20   [provider]  ferrous sulfate 325 (65 FE) MG tablet Take 1 tablet by mouth daily.    [provider]  Fluticasone Furoate-Vilanterol (BREO ELLIPTA) 100-25 MCG/INH AEPB Inhale 1 puff into the lungs daily. 04/25/14   [provider]  ipratropium-albuterol (DUONEB)  0.5-2.5 (3) MG/3ML SOLN Take 3 mLs by nebulization 4 (four) times daily.    [provider]  labetalol (NORMODYNE) 200 MG tablet Take 200 mg by mouth 2 (two) times daily.    [provider]  labetalol (NORMODYNE) 200 MG tablet Take 1 tablet by mouth 2 (two) times daily. 08/30/16   [provider]  lisinopril (PRINIVIL,ZESTRIL) 40 MG tablet Take 40 mg by mouth daily. 01/31/14 01/31/15  [provider]  methylPREDNISolone (MEDROL DOSEPAK) 4 MG TBPK tablet As directed 10/22/15   Rancour, Jeannett Senior, MD  morphine (MSIR) 15 MG tablet Take by mouth. 05/08/20   [provider]  moxifloxacin (VIGAMOX) 0.5 % ophthalmic solution Place 1 drop into the left eye 4 (four) times daily. 03/17/20   [provider]  mupirocin ointment (BACTROBAN) 2 % Apply topically. 03/25/16   [provider]  mupirocin ointment (BACTROBAN) 2 % Apply topically 3 (three) times daily. 03/18/20   [provider]  Naldemedine Tosylate (SYMPROIC) 0.2 MG TABS Take by mouth. 08/02/19   [provider]  naloxone [facility]) nasal spray 4 mg/0.1 mL Place into the nose. 09/17/18   [provider]  naproxen (NAPROSYN) 500 MG tablet Take by mouth. 06/06/18   [provider]  ondansetron (ZOFRAN) 4 MG tablet Take 4 mg by mouth every 8 (eight) hours as needed.    [provider]  orphenadrine (NORFLEX) 100 MG tablet Take by mouth. 10/06/19   [provider]  orphenadrine (NORFLEX) 100 MG tablet Take 100 mg by mouth 2 (two) times daily. 05/03/20   [provider]  oxcarbazepine (TRILEPTAL) 600 MG tablet  11/09/19   [provider]  oxcarbazepine (TRILEPTAL) 600 MG tablet Take by mouth. 04/02/20   [provider]  OXERVATE 0.002 % SOLN Apply to eye. 05/26/20   [provider]  Oxycodone HCl 10 MG TABS TK 1-2 TS PO Q 6 H PRF PAIN. MAX OF 8 TABLETS DAILY 05/28/16   [provider]  oxyCODONE-acetaminophen  (PERCOCET/ROXICET) 5-325 MG tablet Take 1 tablet by mouth every 6 (six) hours as needed for severe pain. 05/31/20   Burgess Amor, PA-C  pantoprazole (PROTONIX) 40 MG tablet  06/24/19   [provider]  pantoprazole (PROTONIX) 40 MG tablet Take 1 tablet by mouth daily. 03/25/20   [provider]  Potassium Chloride ER 20 MEQ TBCR Take 20 mEq by mouth daily. 11/23/14   [provider]  pravastatin (PRAVACHOL) 40 MG tablet Take 40 mg by mouth at bedtime. 11/28/14 11/28/15  [provider]  prednisoLONE acetate (PRED FORTE) 1 % ophthalmic suspension 1 drop 4 (four) times daily. 03/01/20   [provider]  promethazine (PHENERGAN) 25 MG tablet Take 1 tablet (25 mg total) by mouth every 6 (six) hours as needed for nausea or vomiting. 07/21/16   Eber Hong, MD  rOPINIRole (REQUIP) 2 MG tablet Take 2 mg by mouth 2 (two) times daily. 11/11/14   [provider]  simethicone (GAS RELIEF EXTRA STRENGTH) 125  MG chewable tablet Chew 125 mg by mouth daily as needed for flatulence.  05/24/14   [provider]  tapentadol (NUCYNTA) 100 MG 12 hr tablet Take by mouth.    [provider]  tapentadol (NUCYNTA) 50 MG tablet Take by mouth.    [provider]  tizanidine (ZANAFLEX) 2 MG capsule Take by mouth.    [provider]  topiramate (TOPAMAX) 25 MG tablet Take 50 mg by mouth at bedtime. 11/24/14   [provider]  traMADol (ULTRAM) 50 MG tablet Take 50 mg by mouth every 6 (six) hours as needed. pain 02/24/14   [provider]  zolpidem (AMBIEN) 10 MG tablet Take by mouth. 09/18/18   [provider]  zolpidem (AMBIEN) 10 MG tablet Take 10 mg by mouth at bedtime as needed. 05/02/20   [provider]      Allergies    Buprenorphine hcl, Bupropion, Codeine, Pregabalin, Cymbalta [duloxetine hcl], Diclofenac, Diclofenac sodium, Diclofenac sodium, Gabapentin, Nsaids, Sulfa antibiotics, Clindamycin,  Clindamycin/lincomycin, and Toradol [ketorolac tromethamine]    Review of Systems   Review of Systems  Cardiovascular:  Positive for near-syncope.  Gastrointestinal:  Negative for abdominal pain.  All other systems reviewed and are negative.   Physical Exam Updated Vital Signs BP (!) 204/98   Pulse 78   Temp 100.3 F (37.9 C) (Oral)   Resp 17   Ht 4\' 11"  (1.499 m)   Wt 47.6 kg   SpO2 99%   BMI 21.21 kg/m  Physical Exam Vitals and nursing note reviewed.  Constitutional:      Appearance: She is well-developed. She is ill-appearing.  HENT:     Head: Normocephalic.     Mouth/Throat:     Mouth: Mucous membranes are moist.  Eyes:     Pupils: Pupils are equal, round, and reactive to light.  Cardiovascular:     Rate and Rhythm: Normal rate.  Pulmonary:     Effort: Pulmonary effort is normal.  Abdominal:     General: There is no distension.  Musculoskeletal:        General: Normal range of motion.     Cervical back: Normal range of motion.  Skin:    General: Skin is warm.  Neurological:     General: No focal deficit present.     Mental Status: She is alert and oriented to person, place, and time.     ED Results / Procedures / Treatments   Labs (all labs ordered are listed, but only abnormal results are displayed) Labs Reviewed  BASIC METABOLIC PANEL - Abnormal; Notable for the following components:      Result Value   Potassium 2.5 (*)    Glucose, Bld 107 (*)    All other components within normal limits  CBC WITH DIFFERENTIAL/PLATELET    EKG None  Radiology No results found.  Procedures .Critical Care  Performed by: Elson Areas, PA-C Authorized by: Elson Areas, PA-C   Critical care provider statement:    Critical care time (minutes):  30   Critical care start time:  05/13/2023 6:30 PM   Critical care end time:  05/13/2023 9:55 PM   Critical care was necessary to treat or prevent imminent or life-threatening deterioration of the following  conditions:  Metabolic crisis and dehydration   Critical care was time spent personally by me on the following activities:  Development of treatment plan with patient or surrogate, discussions with consultants, evaluation of patient's response to treatment, examination of patient,  ordering and review of laboratory studies, ordering and review of radiographic studies, ordering and performing treatments and interventions, pulse oximetry, re-evaluation of patient's condition, review of old charts and interpretation of cardiac output measurements   Care discussed with: admitting provider       Medications Ordered in ED Medications  potassium chloride 10 mEq in 100 mL IVPB (10 mEq Intravenous New Bag/Given 05/13/23 1935)  sodium chloride 0.9 % bolus 1,000 mL (0 mLs Intravenous Stopped 05/13/23 1953)  HYDROmorphone (DILAUDID) injection 1 mg (1 mg Intravenous Given 05/13/23 1821)  cloNIDine (CATAPRES) tablet 0.2 mg (0.2 mg Oral Given 05/13/23 1823)  potassium chloride SA (KLOR-CON M) CR tablet 40 mEq (40 mEq Oral Given 05/13/23 1931)  amLODipine (NORVASC) tablet 10 mg (10 mg Oral Given 05/13/23 2004)    ED Course/ Medical Decision Making/ A&P                                 Medical Decision Making Patient reports that she is not eating or drinking due to severe pain from trigeminal neuralgia.  Patient complains of increased weakness.  Patient states she was so weak today that she fell and struck her right arm.  Patient did not strike her head she did not lose consciousness  Amount and/or Complexity of Data Reviewed Independent Historian: spouse    Details: Patient here with her husband. External Data Reviewed: notes.    Details: Patient is followed at [facility] by pain management and by primary care Clarita Crane Labs: ordered. Decision-making details documented in ED Course.    Details: Labs ordered reviewed and interpreted.  Patient's potassium is 2.5 ECG/medicine tests: ordered and independent  interpretation performed. Decision-making details documented in ED Course.    Details: EKG shows normal sinus no acute changes Discussion of management or test interpretation with external provider(s): Hospitalist consulted  Risk Prescription drug management. Risk Details: Patient given IV fluids.  Patient given IV potassium.  She was able to tolerate oral potassium but still does not feel like eating or drinking.  Patient is hypertensive.  I gave her her midday medications.  Patient has continued to be hypertensive.  Patient continues to feel weak and nauseated.  I will discuss admission with hospitalist for further treatment.           Final Clinical Impression(s) / ED Diagnoses Final diagnoses:  Hypertension, unspecified type  Weakness  Hyperkalemia  Other chronic pain    Rx / DC Orders ED Discharge Orders     None         Osie Cheeks 05/13/23 2156    Bethann Berkshire, MD 05/15/23 [phone]

## 2023-05-13 NOTE — Assessment & Plan Note (Addendum)
 This is severe.  2.8. would explain patinet presentation of generalised weakness.  Magnesium is within normal limits.  Patient treated with 30 mEq IV KCl as well as 40 mg p.o. once.  At approximately 7:30 PM.  I will add on 60 mEq KCl p.o. every 4 hourly for 2 doses.  Given patient's creatinine being normal, I feel we can be liberal with potassium replacement.  Patient does not report any history of enteric losses.  I believe her hypokalemia is due to reduced p.o. intake as in the HPI.

## 2023-05-14 ENCOUNTER — Inpatient Hospital Stay (HOSPITAL_COMMUNITY)
Admit: 2023-05-14 | Discharge: 2023-05-14 | Disposition: A | Discharge status: Home / Self Care | Attending: Internal Medicine

## 2023-05-14 ENCOUNTER — Other Ambulatory Visit (HOSPITAL_COMMUNITY)

## 2023-05-14 DIAGNOSIS — Z87891 Personal history of nicotine dependence: Secondary | ICD-10-CM | POA: Diagnosis not present

## 2023-05-14 DIAGNOSIS — R531 Weakness: Secondary | ICD-10-CM | POA: Diagnosis present

## 2023-05-14 DIAGNOSIS — M1990 Unspecified osteoarthritis, unspecified site: Secondary | ICD-10-CM | POA: Diagnosis present

## 2023-05-14 DIAGNOSIS — H169 Unspecified keratitis: Secondary | ICD-10-CM | POA: Diagnosis present

## 2023-05-14 DIAGNOSIS — R633 Feeding difficulties, unspecified: Secondary | ICD-10-CM | POA: Diagnosis present

## 2023-05-14 DIAGNOSIS — I1A0 Resistant hypertension: Secondary | ICD-10-CM | POA: Diagnosis present

## 2023-05-14 DIAGNOSIS — R000 Tachycardia, unspecified: Secondary | ICD-10-CM | POA: Diagnosis present

## 2023-05-14 DIAGNOSIS — G8929 Other chronic pain: Secondary | ICD-10-CM | POA: Diagnosis present

## 2023-05-14 DIAGNOSIS — I10 Essential (primary) hypertension: Secondary | ICD-10-CM | POA: Diagnosis present

## 2023-05-14 DIAGNOSIS — G500 Trigeminal neuralgia: Secondary | ICD-10-CM | POA: Diagnosis present

## 2023-05-14 DIAGNOSIS — F419 Anxiety disorder, unspecified: Secondary | ICD-10-CM | POA: Diagnosis present

## 2023-05-14 DIAGNOSIS — Z90710 Acquired absence of both cervix and uterus: Secondary | ICD-10-CM | POA: Diagnosis not present

## 2023-05-14 DIAGNOSIS — G2581 Restless legs syndrome: Secondary | ICD-10-CM | POA: Diagnosis present

## 2023-05-14 DIAGNOSIS — E785 Hyperlipidemia, unspecified: Secondary | ICD-10-CM | POA: Diagnosis present

## 2023-05-14 DIAGNOSIS — E876 Hypokalemia: Secondary | ICD-10-CM | POA: Diagnosis present

## 2023-05-14 DIAGNOSIS — Z886 Allergy status to analgesic agent status: Secondary | ICD-10-CM | POA: Diagnosis not present

## 2023-05-14 DIAGNOSIS — M797 Fibromyalgia: Secondary | ICD-10-CM | POA: Diagnosis present

## 2023-05-14 DIAGNOSIS — H5789 Other specified disorders of eye and adnexa: Secondary | ICD-10-CM | POA: Diagnosis present

## 2023-05-14 DIAGNOSIS — I160 Hypertensive urgency: Secondary | ICD-10-CM | POA: Diagnosis present

## 2023-05-14 DIAGNOSIS — K589 Irritable bowel syndrome without diarrhea: Secondary | ICD-10-CM | POA: Diagnosis present

## 2023-05-14 DIAGNOSIS — Z79899 Other long term (current) drug therapy: Secondary | ICD-10-CM | POA: Diagnosis not present

## 2023-05-14 DIAGNOSIS — Z8679 Personal history of other diseases of the circulatory system: Secondary | ICD-10-CM | POA: Diagnosis not present

## 2023-05-14 DIAGNOSIS — Z881 Allergy status to other antibiotic agents status: Secondary | ICD-10-CM | POA: Diagnosis not present

## 2023-05-14 DIAGNOSIS — K219 Gastro-esophageal reflux disease without esophagitis: Secondary | ICD-10-CM | POA: Diagnosis present

## 2023-05-14 DIAGNOSIS — R569 Unspecified convulsions: Secondary | ICD-10-CM | POA: Diagnosis not present

## 2023-05-14 DIAGNOSIS — G4700 Insomnia, unspecified: Secondary | ICD-10-CM | POA: Diagnosis present

## 2023-05-14 LAB — CBC
HCT: 38.7 % (ref 36.0–46.0)
Hemoglobin: 12.1 g/dL (ref 12.0–15.0)
MCH: 27.9 pg (ref 26.0–34.0)
MCHC: 31.3 g/dL (ref 30.0–36.0)
MCV: 89.4 fL (ref 80.0–100.0)
Platelets: 238 10*3/uL (ref 150–400)
RBC: 4.33 MIL/uL (ref 3.87–5.11)
RDW: 13.8 % (ref 11.5–15.5)
WBC: 7.9 10*3/uL (ref 4.0–10.5)
nRBC: 0 % (ref 0.0–0.2)

## 2023-05-14 LAB — PROTIME-INR
INR: 1 (ref 0.8–1.2)
Prothrombin Time: 13.3 s (ref 11.4–15.2)

## 2023-05-14 LAB — HIV ANTIBODY (ROUTINE TESTING W REFLEX): HIV Screen 4th Generation wRfx: NONREACTIVE

## 2023-05-14 LAB — BASIC METABOLIC PANEL
Anion gap: 5 (ref 5–15)
BUN: 16 mg/dL (ref 8–23)
CO2: 25 mmol/L (ref 22–32)
Calcium: 9.1 mg/dL (ref 8.9–10.3)
Chloride: 112 mmol/L — ABNORMAL HIGH (ref 98–111)
Creatinine, Ser: 0.8 mg/dL (ref 0.44–1.00)
GFR, Estimated: >60 mL/min (ref >60)
Glucose, Bld: 105 mg/dL — ABNORMAL HIGH (ref 70–99)
Potassium: 4.2 mmol/L (ref 3.5–5.1)
Sodium: 142 mmol/L (ref 135–145)

## 2023-05-14 LAB — APTT: aPTT: 27 s (ref 24–36)

## 2023-05-14 LAB — SEDIMENTATION RATE: Sed Rate: 3 mm/h (ref 0–22)

## 2023-05-14 LAB — C-REACTIVE PROTEIN: CRP: <0.5 mg/dL (ref <1.0)

## 2023-05-14 MED ORDER — AMLODIPINE BESYLATE 5 MG PO TABS
10.0000 mg | ORAL_TABLET | Freq: Every day | ORAL | Status: DC
  Administered 2023-05-14 – 2023-05-16 (×3): 10 mg via ORAL
  Filled 2023-05-14 (×3): qty 2

## 2023-05-14 MED ORDER — ENSURE ENLIVE PO LIQD
237.0000 mL | Freq: Three times a day (TID) | ORAL | Status: DC
  Administered 2023-05-14 – 2023-05-15 (×3): 237 mL via ORAL

## 2023-05-14 MED ORDER — SODIUM CHLORIDE 0.45 % IV SOLN: INTRAVENOUS | Status: DC

## 2023-05-14 MED ORDER — PREDNISOLONE ACETATE 1 % OP SUSP
1.0000 [drp] | Freq: Four times a day (QID) | OPHTHALMIC | Status: DC
Start: 2023-05-14 — End: 2023-05-16
  Administered 2023-05-14 – 2023-05-16 (×6): 1 [drp] via OPHTHALMIC
  Filled 2023-05-14: qty 5

## 2023-05-14 MED ORDER — ROPINIROLE HCL 1 MG PO TABS
2.0000 mg | ORAL_TABLET | Freq: Two times a day (BID) | ORAL | Status: DC
  Administered 2023-05-14 – 2023-05-16 (×5): 2 mg via ORAL
  Filled 2023-05-14 (×5): qty 2

## 2023-05-14 MED ORDER — CLONIDINE HCL 0.2 MG PO TABS
0.2000 mg | ORAL_TABLET | Freq: Two times a day (BID) | ORAL | Status: DC
  Administered 2023-05-14 – 2023-05-16 (×4): 0.2 mg via ORAL
  Filled 2023-05-14 (×4): qty 1

## 2023-05-14 MED ORDER — ERYTHROMYCIN 5 MG/GM OP OINT
TOPICAL_OINTMENT | Freq: Three times a day (TID) | OPHTHALMIC | Status: DC
  Administered 2023-05-14 – 2023-05-16 (×4): 1 via OPHTHALMIC
  Filled 2023-05-14: qty 3.5

## 2023-05-14 MED ORDER — PANTOPRAZOLE SODIUM 40 MG PO TBEC
40.0000 mg | DELAYED_RELEASE_TABLET | Freq: Every day | ORAL | Status: DC
  Administered 2023-05-14 – 2023-05-16 (×3): 40 mg via ORAL
  Filled 2023-05-14 (×3): qty 1

## 2023-05-14 NOTE — Progress Notes (Signed)
 [facility] PROGRESS NOTE  Keslyn Teater Tuley (DOB: 1960/03/14) ZOX:[id number] PCP: Miles Costain, NP Outpatient Specialists: [facility]  Brief Narrative: NEKA BISE is a 63 y.o. 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 She came to the ED in this setting for worse pain, general weakness causing falls, and was found to be hypokalemic and severely hypertensive.   Subjective: Reports ongoing pain that is improved with IV dilaudid, but still not taking much by mouth. Some water but no nutrition thus far.   Objective: BP (!) 165/98 (BP Location: Left Arm)   Pulse 71   Temp 98.4 F (36.9 C) (Oral)   Resp 16   Ht 4\' 11"  (1.499 m)   Wt 47.6 kg   SpO2 100%   BMI 21.21 kg/m   Gen: In evident discomfort HEENT: Left eye noninjected, vision impaired and at baseline Pulm: Clear, nonlabored  CV: RRR, no MRG GI: Soft, NT, ND, +BS  Neuro: Alert and oriented. No new focal deficits. Ext: Warm, no deformities Skin: No rashes, lesions or ulcers on visualized skin   Assessment & Plan: Intractable acute on chronic pain due to trigeminal neuralgia: Note ESR reassuring against alternative etiologies (e.g. GCA) at 3. CT head nonacute - Continue oral morphine (confirmed by PDMP review patient regularly received morphine sulfate IR 15mg  #180 for 30 day supply, last filled 2/28, prescribed by Cassandria Anger, NP, pain management affiliated with [facility] in [place], [place]) - Given acute breakthrough, will also give IV dilaudid if unable to take po and/or pain still severe despite morphine.  - Continue ropinirole 2mg  BID (?also for RLS)  Poor oral intake: Due to pain.  - Requires at least  maintenance IVF while we attempt to control pain well enough to push oral intake. Unable to satisfy hydration or nutritional needs at this time. Continue antiemetics as well.   Hypokalemia: Improved with supplementation.  - Continue supplementation prn, recheck in AM  HTN urgency: Uncontrolled in setting of holding home meds (including clonidine) and uncontrolled pain, since improved.  - Continue clonidine 0.2mg  BID, norvasc 10mg . BP at goal while holding lisinopril 40mg , labetalol 200mg  BID and HR in low 60's at rest, so not restarting at this time. prn is ordered.   Left keratitis:  - Restart pt's long term gtt's including prednisone, erythromycin OS.  History of intracranial AVMs: Further management per outpatient providers, namely [facility].   GERD:  - Continue PPI  HLD:  - Continue statin after discharge.   Tyrone Nine, MD [facility] [email or web address] 05/14/2023, 2:37 PM

## 2023-05-14 NOTE — Procedures (Signed)
 Patient Name: Renee Gutierrez  MRN: [id number]  Epilepsy Attending: Charlsie Quest  Referring Physician/Provider: Nolberto Hanlon, MD  Date: 05/14/2023 Duration: 23.35 mins  Patient history: 63 year old female with near syncope.  EEG to evaluate for seizure.  Level of alertness: Awake  AEDs during EEG study: TPM  Technical aspects: This EEG study was done with scalp electrodes positioned according to the 10-20 International system of electrode placement. Electrical activity was reviewed with band pass filter of 1-70Hz , sensitivity of 7 uV/mm, display speed of 90mm/sec with a 60Hz  notched filter applied as appropriate. EEG data were recorded continuously and digitally stored.  Video monitoring was available and reviewed as appropriate.  Description: The posterior dominant rhythm consists of 8-9 Hz activity of moderate voltage (25-35 uV) seen predominantly in posterior head regions, symmetric and reactive to eye opening and eye closing. Hyperventilation and photic stimulation were not performed.     IMPRESSION: This study is within normal limits. No seizures or epileptiform discharges were seen throughout the recording.  A normal interictal EEG does not exclude the diagnosis of epilepsy.  Shadman Tozzi [name]

## 2023-05-14 NOTE — Progress Notes (Signed)
 EEG complete - results pending

## 2023-05-14 NOTE — Progress Notes (Signed)
 Mobility Specialist Progress Note:    05/14/23 1140  Mobility  Activity Ambulated with assistance to bathroom  Level of Assistance Standby assist, set-up cues, supervision of patient - no hands on  Assistive Device Front wheel walker  Distance Ambulated (ft) 25 ft  Range of Motion/Exercises Active;All extremities  Activity Response Tolerated well  Mobility Referral Yes  Mobility visit 1 Mobility  Mobility Specialist Start Time (ACUTE ONLY) 1125  Mobility Specialist Stop Time (ACUTE ONLY) 1140  Mobility Specialist Time Calculation (min) (ACUTE ONLY) 15 min   Pt received in bed, requesting assistance to bathroom. Husband at bedside. Required SBA to stand and ambulate with RW. Tolerated well, c/o headache. Returned pt supine, alarm on. Notified nurse, all needs met.  Lawerance Bach Mobility Specialist Please contact via Special educational needs teacher or  Rehab office at [phone]

## 2023-05-14 NOTE — Plan of Care (Signed)

## 2023-05-14 NOTE — Progress Notes (Signed)
 Transition of Care Department [facility]) has reviewed patient and no other TOC needs have been identified at this time. We will continue to monitor patient advancement through interdisciplinary progression rounds. If new patient transition needs arise, please place a TOC consult.   05/14/23 1011  TOC Brief Assessment  Insurance and Status Reviewed  Patient has primary care physician Yes  Home environment has been reviewed Lives with husband.  Prior level of function: Independent.  Prior/Current Home Services No current home services  Social Drivers of Health Review SDOH reviewed no interventions necessary  Readmission risk has been reviewed Yes  Transition of care needs no transition of care needs at this time

## 2023-05-14 NOTE — Plan of Care (Signed)
   Problem: Education: Goal: Knowledge of General Education information will improve Description Including pain rating scale, medication(s)/side effects and non-pharmacologic comfort measures Outcome: Progressing

## 2023-05-14 NOTE — Progress Notes (Signed)
 Pt states she does not want any of her healthcare information to be given to her daughter, Ofilia Neas.

## 2023-05-15 DIAGNOSIS — G500 Trigeminal neuralgia: Secondary | ICD-10-CM | POA: Diagnosis not present

## 2023-05-15 LAB — BASIC METABOLIC PANEL WITH GFR
Anion gap: 7 (ref 5–15)
BUN: 11 mg/dL (ref 8–23)
CO2: 21 mmol/L — ABNORMAL LOW (ref 22–32)
Calcium: 9.1 mg/dL (ref 8.9–10.3)
Chloride: 110 mmol/L (ref 98–111)
Creatinine, Ser: 0.66 mg/dL (ref 0.44–1.00)
GFR, Estimated: >60 mL/min (ref >60)
Glucose, Bld: 89 mg/dL (ref 70–99)
Potassium: 3 mmol/L — ABNORMAL LOW (ref 3.5–5.1)
Sodium: 138 mmol/L (ref 135–145)

## 2023-05-15 MED ORDER — LABETALOL HCL 200 MG PO TABS
200.0000 mg | ORAL_TABLET | Freq: Two times a day (BID) | ORAL | Status: DC
  Administered 2023-05-15 – 2023-05-16 (×2): 200 mg via ORAL
  Filled 2023-05-15 (×2): qty 1

## 2023-05-15 MED ORDER — POTASSIUM CHLORIDE CRYS ER 20 MEQ PO TBCR
40.0000 meq | EXTENDED_RELEASE_TABLET | Freq: Once | ORAL | Status: AC
  Administered 2023-05-15: 40 meq via ORAL
  Filled 2023-05-15: qty 2

## 2023-05-15 MED ORDER — KCL IN DEXTROSE-NACL 20-5-0.45 MEQ/L-%-% IV SOLN: INTRAVENOUS | Status: DC

## 2023-05-15 MED ORDER — LISINOPRIL 10 MG PO TABS
40.0000 mg | ORAL_TABLET | Freq: Every day | ORAL | Status: DC
Start: 2023-05-15 — End: 2023-05-16
  Administered 2023-05-15 – 2023-05-16 (×2): 40 mg via ORAL
  Filled 2023-05-15 (×2): qty 4

## 2023-05-15 MED ORDER — ONDANSETRON HCL 4 MG/2ML IJ SOLN
4.0000 mg | Freq: Four times a day (QID) | INTRAMUSCULAR | Status: DC | PRN
  Administered 2023-05-15: 4 mg via INTRAVENOUS
  Filled 2023-05-15: qty 2

## 2023-05-15 MED ORDER — MIRTAZAPINE 15 MG PO TBDP
15.0000 mg | ORAL_TABLET | Freq: Every day | ORAL | Status: DC
  Administered 2023-05-15: 15 mg via ORAL
  Filled 2023-05-15 (×2): qty 1

## 2023-05-15 NOTE — Plan of Care (Signed)

## 2023-05-15 NOTE — Progress Notes (Signed)
 [facility] PROGRESS NOTE  Renee Gutierrez (DOB: May 24, 1960) WUJ:[id number] PCP: Miles Costain, NP Outpatient Specialists: [facility]  Brief Narrative: Renee Gutierrez is a 63 y.o. 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 She came to the ED in this setting for worse pain, general weakness causing falls, and was found to be hypokalemic and severely hypertensive.   Subjective: Pain is improved, though she's taking dilaudid IV quite frequently to accomplish this. Has ensure at bedside but reluctant to try it due to pain. Spouse, daughter, granddaughter at bedside.   Objective: BP (!) 169/87 (BP Location: Left Arm)   Pulse (!) 108   Temp 97.9 F (36.6 C)   Resp 19   Ht 4\' 11"  (1.499 m)   Wt 47.6 kg   SpO2 96%   BMI 21.21 kg/m   Gen: No acute distress Pulm: Clear, nonlabored  CV: Regular tachycardia, no MRG or edema GI: Soft, NT, ND, +BS Neuro: Alert and oriented. No new focal deficits. Ext: Warm, no deformities. Skin: No new rashes, lesions or ulcers on visualized skin   Assessment & Plan: Intractable acute on chronic pain due to trigeminal neuralgia: Note ESR reassuring against alternative etiologies (e.g. GCA) at 3. CT head nonacute. Symptoms overall improving slowly.  - Continue oral morphine (confirmed by PDMP review patient regularly received morphine sulfate IR 15mg  #180 for 30 day supply, last filled 2/28, prescribed by Cassandria Anger, NP, pain management affiliated with [facility] in [place], [place]) - Given acute breakthrough, will also give IV dilaudid if unable to take po and/or pain still severe despite morphine.  - Continue ropinirole 2mg  BID (?also for  RLS)  Poor oral intake: Due to pain.  - Continues to require maintenance IVF while we attempt to control pain well enough to push oral intake. Unable to satisfy hydration or nutritional needs at this time. Continue antiemetics as well.   Hypokalemia: Improved with supplementation.  - Repeat supplementation today, add to IVF. Restarting ACE should also help.   HTN urgency: Uncontrolled in setting of holding home meds (including clonidine) and uncontrolled pain, since improved.  - Continue clonidine 0.2mg  BID, norvasc 10mg . BP rising, so will restart lisinopril 40mg . Restart labetalol 200mg  BID   Left keratitis:  - Restart pt's long term gtt's including prednisone, erythromycin OS.  History of intracranial AVMs: Further management per outpatient providers, namely [facility].   GERD:  - Continue PPI  HLD:  - Continue statin after discharge.   Insomnia:  - Trial mirtazapine. Trazodone previously unhelpful and ambien too high risk. Urged to instate day-night cycle.   Tyrone Nine, MD [facility] [email or web address] 05/15/2023, 3:36 PM

## 2023-05-16 DIAGNOSIS — G500 Trigeminal neuralgia: Secondary | ICD-10-CM | POA: Diagnosis not present

## 2023-05-16 LAB — BASIC METABOLIC PANEL WITH GFR
Anion gap: 4 — ABNORMAL LOW (ref 5–15)
BUN: 18 mg/dL (ref 8–23)
CO2: 24 mmol/L (ref 22–32)
Calcium: 9.2 mg/dL (ref 8.9–10.3)
Chloride: 113 mmol/L — ABNORMAL HIGH (ref 98–111)
Creatinine, Ser: 0.85 mg/dL (ref 0.44–1.00)
GFR, Estimated: >60 mL/min (ref >60)
Glucose, Bld: 113 mg/dL — ABNORMAL HIGH (ref 70–99)
Potassium: 3.6 mmol/L (ref 3.5–5.1)
Sodium: 141 mmol/L (ref 135–145)

## 2023-05-16 LAB — MAGNESIUM: Magnesium: 1.7 mg/dL (ref 1.7–2.4)

## 2023-05-16 MED ORDER — MIRTAZAPINE 15 MG PO TABS: 15.0000 mg | ORAL_TABLET | Freq: Every evening | ORAL | 0 refills | Status: DC | PRN

## 2023-05-16 NOTE — Discharge Summary (Signed)
 Physician Discharge Summary   Patient: Renee Gutierrez MRN: [id number] DOB: 10/22/1960  Admit date:     05/13/2023  Discharge date: 05/16/23  Discharge Physician: Tyrone Nine   PCP: Miles Costain, NP   Recommendations at discharge:  Continue pain management per outpatient provider. Patient requesting increased dosage of chronic morphine medication, deferred with no new prescriptions at discharge.  Continue routine follow up with PCP.  Recheck BP and manage HTN at follow up (continued home medications with improvement, though not at long term goal.  Recheck BMP with attention toward potassium (hypokalemic during admission) at follow up.   Discharge Diagnoses: Principal Problem:   Trigeminal neuralgia Active Problems:   Resistant hypertension   Hypokalemia  Hospital Course: Renee Gutierrez is a 63 y.o. 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 She came to the ED in this setting for worse pain, general weakness causing falls, and was found to be hypokalemic and severely hypertensive. She was admitted, given IV fluids and antihypertensives with improvement. See below for problem-based details.  Assessment and Plan: Intractable acute on chronic pain due to trigeminal neuralgia: Note ESR reassuring against alternative etiologies (e.g. GCA) at 3 and negative CRP. CT head nonacute. Symptoms overall improving slowly.  - Continue oral morphine (confirmed by PDMP review patient regularly received morphine sulfate IR 15mg  #180 for 30 day supply, last filled 2/28, prescribed by Cassandria Anger, NP, pain management affiliated with [facility] in [place], [place]) - Continue ropinirole 2mg  BID (?also for  RLS)   Poor oral intake: Due to pain. This has improved and pt is tolerating adequate hydration and caloric intake at this point.     Hypokalemia: Resolved with supplementation and with improved po intake by pt, suggest recheck at follow up. Continue PTA supplement.   HTN urgency: Uncontrolled in setting of holding home meds (including clonidine) and uncontrolled pain, since improved.  - Continue all home medications at discharge including clonidine 0.2mg  BID, norvasc 10mg , lisinopril 40mg , labetalol 200mg  BID. Attention at follow up recommended.    Left keratitis:  - Restart pt's long term gtt's including prednisone, erythromycin OS.   History of intracranial AVMs: Further management per outpatient providers, namely [facility].    GERD:  - Continue PPI   HLD:  - Continue statin after discharge.    Insomnia: Has been chronic problem. Improved with trial of low dose mirtazapine on 3/27, so prescribed at discharge. Note also trazodone previously unhelpful and ambien too high risk. Urged to instate day-night cycle.   Consultants: None Procedures performed: None  Disposition: Home Diet recommendation:  Cardiac diet DISCHARGE MEDICATION: Allergies as of 05/16/2023       Reactions   Buprenorphine Hcl Swelling   All extremeties and neck and face swelled up huge according to patient   Bupropion Other (See Comments)   Moody and depressed   Codeine Itching, Nausea And Vomiting, Swelling, Nausea Only   Other reaction(s): Itching, Nausea And Vomiting, Swelling   Pregabalin Swelling   Other reaction(s): Swelling   Cymbalta [duloxetine Hcl] Swelling   Diclofenac Swelling   cream   Diclofenac Sodium Swelling   Oral med   Diclofenac Sodium Swelling   Oral  med Oral med Oral med   Gabapentin Swelling   Other reaction(s): Swelling   Nsaids    Sulfa Antibiotics    Tapentadol Nausea Only, Nausea And Vomiting   Clindamycin Other (See Comments)   Heart burn    Clindamycin/lincomycin Rash, Other (See Comments)   Heart burn Heart burn Other reaction(s): Other (See Comments) Heart burn Other reaction(s): Other (See Comments) Heart burn   Toradol [ketorolac Tromethamine] Rash        Medication List     STOP taking these medications    bacitracin ophthalmic ointment   carbamazepine 200 MG tablet Commonly known as: TEGRETOL   cephALEXin 500 MG capsule Commonly known as: KEFLEX   chlorhexidine 0.12 % solution Commonly known as: PERIDEX   cyclobenzaprine 10 MG tablet Commonly known as: FLEXERIL   methylPREDNISolone 4 MG Tbpk tablet Commonly known as: MEDROL DOSEPAK   moxifloxacin 0.5 % ophthalmic solution Commonly known as: VIGAMOX   naproxen 500 MG tablet Commonly known as: NAPROSYN   oxcarbazepine 600 MG tablet Commonly known as: TRILEPTAL   Oxervate 0.002 % Soln Generic drug: Cenegermin-bkbj   Oxycodone HCl 10 MG Tabs   oxyCODONE-acetaminophen 5-325 MG tablet Commonly known as: PERCOCET/ROXICET   Symproic 0.2 MG Tabs Generic drug: Naldemedine Tosylate   tapentadol 100 MG 12 hr tablet Commonly known as: NUCYNTA   tapentadol 50 MG tablet Commonly known as: NUCYNTA   tizanidine 2 MG capsule Commonly known as: ZANAFLEX   Topamax 25 MG tablet Generic drug: topiramate   traMADol 50 MG tablet Commonly known as: ULTRAM   zolpidem 10 MG tablet Commonly known as: AMBIEN       TAKE these medications    amLODipine 10 MG tablet Commonly known as: NORVASC Take by mouth.   aspirin EC 81 MG tablet Take 81 mg by mouth daily.   aspirin-acetaminophen-caffeine 250-250-65 MG tablet Commonly known as: EXCEDRIN MIGRAINE Take 2 tablets by mouth daily as needed. For pain   atorvastatin 20 MG tablet Commonly known as: LIPITOR Take 1 tablet by mouth daily.   Breo Ellipta 100-25 MCG/INH Aepb Generic drug: fluticasone furoate-vilanterol Inhale 1 puff into the lungs daily.   Catapres 0.2 MG tablet Generic drug:  cloNIDine Take 0.2 mg by mouth 2 (two) times daily.   diclofenac Sodium 1 % Gel Commonly known as: VOLTAREN Apply 2 g topically 4 (four) times daily.   DULoxetine 60 MG capsule Commonly known as: CYMBALTA Take by mouth. What changed: Another medication with the same name was removed. Continue taking this medication, and follow the directions you see here.   ERGOCALCIFEROL PO Take 1 capsule every week by oral route for 90 days.   erythromycin ophthalmic ointment PLACE INTO THE LEFT EYE 3  TIMES DAILY What changed: Another medication with the same name was removed. Continue taking this medication, and follow the directions you see here.   ferrous sulfate 325 (65 FE) MG tablet Take 1 tablet by mouth daily.   Gas Relief Extra Strength 125 MG chewable tablet Generic drug: simethicone Chew 125 mg by mouth daily as needed for flatulence.   ipratropium-albuterol 0.5-2.5 (3) MG/3ML Soln Commonly known as: DUONEB Take 3 mLs by nebulization 4 (four) times daily.   labetalol 200 MG tablet Commonly known as: NORMODYNE Take 200 mg by mouth 2 (two) times daily.   lisinopril 40 MG tablet Commonly known as: ZESTRIL Take 40 mg by mouth daily.   mirtazapine 15 MG tablet Commonly known as: Remeron Take 1 tablet (15 mg total)  by mouth at bedtime as needed (insomnia).   morphine 15 MG tablet Commonly known as: MSIR Take by mouth.   mupirocin ointment 2 % Commonly known as: BACTROBAN Apply topically.   naloxone 4 MG/0.1ML Liqd nasal spray kit Commonly known as: NARCAN Place into the nose.   ondansetron 4 MG tablet Commonly known as: ZOFRAN Take 4 mg by mouth every 8 (eight) hours as needed.   orphenadrine 100 MG tablet Commonly known as: NORFLEX Take by mouth.   pantoprazole 40 MG tablet Commonly known as: PROTONIX Take 1 tablet by mouth daily.   Potassium Chloride ER 20 MEQ Tbcr Take 20 mEq by mouth daily.   pravastatin 40 MG tablet Commonly known as: PRAVACHOL Take  40 mg by mouth at bedtime.   prednisoLONE acetate 1 % ophthalmic suspension Commonly known as: PRED FORTE 1 drop 4 (four) times daily.   promethazine 25 MG tablet Commonly known as: PHENERGAN Take 1 tablet (25 mg total) by mouth every 6 (six) hours as needed for nausea or vomiting.   rOPINIRole 2 MG tablet Commonly known as: REQUIP Take 2 mg by mouth 2 (two) times daily.        Discharge Exam: Filed Weights   05/13/23 1547  Weight: 47.6 kg  BP (!) 150/67 (BP Location: Left Arm)   Pulse 76   Temp 98.1 F (36.7 C)   Resp 16   Ht 4\' 11"  (1.499 m)   Wt 47.6 kg   SpO2 99%   BMI 21.21 kg/m   Nontoxic female in no acute distress laying on left side No new focal deficits, no active ocular inflammation noted Nonlabored  Condition at discharge: stable  The results of significant diagnostics from this hospitalization (including imaging, microbiology, ancillary and laboratory) are listed below for reference.   Imaging Studies: EEG adult Result Date: 05/14/2023 Charlsie Quest, MD     05/14/2023  1:16 PM Patient Name: JASIEL APACHITO MRN: [id number] Epilepsy Attending: Charlsie Quest Referring Physician/Provider: Nolberto Hanlon, MD Date: 05/14/2023 Duration: 23.35 mins Patient history: 63 year old female with near syncope.  EEG to evaluate for seizure. Level of alertness: Awake AEDs during EEG study: TPM Technical aspects: This EEG study was done with scalp electrodes positioned according to the 10-20 International system of electrode placement. Electrical activity was reviewed with band pass filter of 1-70Hz , sensitivity of 7 uV/mm, display speed of 45mm/sec with a 60Hz  notched filter applied as appropriate. EEG data were recorded continuously and digitally stored.  Video monitoring was available and reviewed as appropriate. Description: The posterior dominant rhythm consists of 8-9 Hz activity of moderate voltage (25-35 uV) seen predominantly in posterior head regions, symmetric and  reactive to eye opening and eye closing. Hyperventilation and photic stimulation were not performed.   IMPRESSION: This study is within normal limits. No seizures or epileptiform discharges were seen throughout the recording. A normal interictal EEG does not exclude the diagnosis of epilepsy. Priyanka [name]   CT HEAD WO CONTRAST ( ) Result Date: 05/13/2023 CLINICAL DATA:  Increasing headaches and recent fall, initial encounter EXAM: CT HEAD WITHOUT CONTRAST TECHNIQUE: Contiguous axial images were obtained from the base of the skull through the vertex without intravenous contrast. RADIATION DOSE REDUCTION: This exam was performed according to the departmental dose-optimization program which includes automated exposure control, adjustment of the mA and/or kV according to patient size and/or use of iterative reconstruction technique. COMPARISON:  03/30/2023 FINDINGS: Brain: No evidence of acute infarction, hemorrhage, hydrocephalus, extra-axial collection or mass lesion/mass  effect. Postsurgical encephalomalacia in the left cerebellar hemisphere is noted. Vascular: Aneurysm clip is noted along the pons on the left consistent with the given clinical history. Skull: Postsurgical changes in the left posterior fossa are seen and stable. Sinuses/Orbits: No acute finding. Other: None. IMPRESSION: Chronic postsurgical changes.  No acute abnormality noted. Electronically Signed   By: Alcide Clever M.D.   On: 05/13/2023 22:32    Microbiology: Results for orders placed or performed during the hospital encounter of 10/22/15  Rapid strep screen     Status: None   Collection Time: 10/22/15  4:44 PM   Specimen: Other  Result Value Ref Range Status   Streptococcus, Group A Screen (Direct) NEGATIVE NEGATIVE Final    Comment: (NOTE) A Rapid Antigen test may result negative if the antigen level in the sample is below the detection level of this test. The FDA has not cleared this test as a stand-alone test therefore  the rapid antigen negative result has reflexed to a Group A Strep culture.   Culture, group A strep     Status: None   Collection Time: 10/22/15  4:44 PM   Specimen: Throat  Result Value Ref Range Status   Specimen Description THROAT  Final   Special Requests NONE Reflexed from [id number]  Final   Culture   Final    NO GROUP A STREP (S.PYOGENES) ISOLATED Performed at [facility]    Report Status 10/25/2015 FINAL  Final    Labs: CBC: Recent Labs  Lab 05/13/23 1830 05/14/23 0430  WBC 7.3 7.9  NEUTROABS 4.1  --   HGB 14.0 12.1  HCT 42.9 38.7  MCV 86.1 89.4  PLT 266 238   Basic Metabolic Panel: Recent Labs  Lab 05/13/23 1830 05/14/23 0430 05/15/23 0423 05/16/23 0457  NA 141 142 138 141  K 2.5* 4.2 3.0* 3.6  CL 101 112* 110 113*  CO2 26 25 21* 24  GLUCOSE 107* 105* 89 113*  BUN 19 16 11 18   CREATININE 0.85 0.80 0.66 0.85  CALCIUM 9.5 9.1 9.1 9.2  MG 1.7  --   --  1.7   Liver Function Tests: Recent Labs  Lab 05/13/23 1830  AST 22  ALT 14  ALKPHOS 53  BILITOT 0.5  PROT 6.7  ALBUMIN 3.9   CBG: No results for input(s): "GLUCAP" in the last 168 hours.  Discharge time spent: greater than 30 minutes.  Signed: Tyrone Nine, MD [facility] 05/16/2023

## 2023-05-16 NOTE — Progress Notes (Signed)
 Mobility Specialist Progress Note:    05/16/23 1035  Mobility  Activity Ambulated with assistance in hallway  Level of Assistance Contact guard assist, steadying assist  Assistive Device Front wheel walker  Distance Ambulated (ft) 40 ft  Range of Motion/Exercises Active;All extremities  Activity Response Tolerated well  Mobility Referral Yes  Mobility visit 1 Mobility  Mobility Specialist Start Time (ACUTE ONLY) 1035  Mobility Specialist Stop Time (ACUTE ONLY) 1055  Mobility Specialist Time Calculation (min) (ACUTE ONLY) 20 min   Pt received in bed, agreeable to mobility. Required CGA to stand and ambulate with RW. Tolerated well,asx throughout. Returned pt supine, all needs met.   Gianah Batt Mobility Specialist Please contact via Special educational needs teacher or  Rehab office at [phone]

## 2023-05-16 NOTE — Plan of Care (Signed)
 ?  Problem: Education: ?Goal: Knowledge of General Education information will improve ?Description: Including pain rating scale, medication(s)/side effects and non-pharmacologic comfort measures ?Outcome: Progressing ?  ?Problem: Health Behavior/Discharge Planning: ?Goal: Ability to manage health-related needs will improve ?Outcome: Progressing ?  ?Problem: Coping: ?Goal: Level of anxiety will decrease ?Outcome: Progressing ?  ?

## 2023-06-12 ENCOUNTER — Encounter (HOSPITAL_COMMUNITY): Payer: Self-pay

## 2023-06-12 ENCOUNTER — Emergency Department (HOSPITAL_COMMUNITY)
Admission: EM | Admit: 2023-06-12 | Discharge: 2023-06-13 | Disposition: A | Discharge status: Home / Self Care | Attending: Emergency Medicine | Admitting: Emergency Medicine

## 2023-06-12 DIAGNOSIS — I10 Essential (primary) hypertension: Secondary | ICD-10-CM | POA: Diagnosis present

## 2023-06-12 DIAGNOSIS — E876 Hypokalemia: Secondary | ICD-10-CM | POA: Insufficient documentation

## 2023-06-12 DIAGNOSIS — Z79899 Other long term (current) drug therapy: Secondary | ICD-10-CM | POA: Diagnosis not present

## 2023-06-12 DIAGNOSIS — Z7982 Long term (current) use of aspirin: Secondary | ICD-10-CM | POA: Insufficient documentation

## 2023-06-12 DIAGNOSIS — G500 Trigeminal neuralgia: Secondary | ICD-10-CM | POA: Insufficient documentation

## 2023-06-12 MED ORDER — OXYCODONE-ACETAMINOPHEN 5-325 MG PO TABS
2.0000 | ORAL_TABLET | Freq: Once | ORAL | Status: AC
  Administered 2023-06-13: 2 via ORAL
  Filled 2023-06-12: qty 2

## 2023-06-12 NOTE — ED Provider Notes (Signed)
 [place] EMERGENCY DEPARTMENT AT [facility] Provider Note   CSN: [id number] Arrival date & time: 06/12/23  1836     History {Add pertinent medical, surgical, social history, OB history to HPI:1} Chief Complaint  Patient presents with   Hypertension    Renee Gutierrez is a 63 y.o. female.  The history is provided by the patient.  Hypertension  She has history of hypertension, hyperlipidemia, trigeminal neuralgia, intracranial AVM status postrepair and comes in complaining of worsening of her left-sided facial pain from her trigeminal neuralgia.  Pain started getting worse yesterday.  Mom in fact, her blood pressure has gone up.  It was as high as 200/110 at home.  She normally checks her blood pressure 3 times a day.  She has noted in the past that when her blood pressure was higher her pain was worse.  She has a chronic left-sided headache which is unchanged from its baseline.  She is blind in her left eye but no new visual changes.  She denies tinnitus or nosebleeds and denies any chest pain or shortness of breath.  She had recently been admitted with a similar flare, was also hypokalemic at that time.  She states she has been compliant with her medications.   Home Medications Prior to Admission medications   Medication Sig Start Date End Date Taking? Authorizing Provider  amLODipine  (NORVASC ) 10 MG tablet Take by mouth. 04/09/20   [provider]  aspirin EC 81 MG tablet Take 81 mg by mouth daily.  09/21/13   [provider]  aspirin-acetaminophen -caffeine (EXCEDRIN MIGRAINE) 250-250-65 MG tablet Take 2 tablets by mouth daily as needed. For pain 05/24/14   [provider]  atorvastatin (LIPITOR) 20 MG tablet Take 1 tablet by mouth daily.    [provider]  cloNIDine  (CATAPRES ) 0.2 MG tablet Take 0.2 mg by mouth 2 (two) times daily.    [provider]  diclofenac Sodium (VOLTAREN) 1 % GEL Apply 2 g topically 4 (four) times daily.  04/18/20   [provider]  DULoxetine (CYMBALTA) 60 MG capsule Take by mouth. 05/16/20   [provider]  ERGOCALCIFEROL PO Take 1 capsule every week by oral route for 90 days. 05/10/23   [provider]  erythromycin  ophthalmic ointment PLACE INTO THE LEFT EYE 3  TIMES DAILY Patient not taking: Reported on 05/14/2023 04/16/18   [provider]  ferrous sulfate 325 (65 FE) MG tablet Take 1 tablet by mouth daily.    [provider]  Fluticasone Furoate-Vilanterol (BREO ELLIPTA) 100-25 MCG/INH AEPB Inhale 1 puff into the lungs daily. Patient not taking: Reported on 05/14/2023 04/25/14   [provider]  ipratropium-albuterol (DUONEB) 0.5-2.5 (3) MG/3ML SOLN Take 3 mLs by nebulization 4 (four) times daily. Patient not taking: Reported on 05/14/2023    [provider]  labetalol  (NORMODYNE ) 200 MG tablet Take 200 mg by mouth 2 (two) times daily.    [provider]  lisinopril  (PRINIVIL ,ZESTRIL ) 40 MG tablet Take 40 mg by mouth daily. 01/31/14 05/14/23  [provider]  mirtazapine  (REMERON ) 15 MG tablet Take 1 tablet (15 mg total) by mouth at bedtime as needed (insomnia). 05/16/23   Wynetta Heckle, MD  morphine  (MSIR) 15 MG tablet Take by mouth. 05/08/20   [provider]  mupirocin ointment (BACTROBAN) 2 % Apply topically. 03/25/16   [provider]  naloxone [facility]) nasal spray 4 mg/0.1 mL Place into the nose. 09/17/18   [provider]  ondansetron  (ZOFRAN ) 4 MG tablet Take 4 mg by mouth every 8 (eight) hours as needed.    [provider]  orphenadrine (NORFLEX) 100 MG tablet Take by mouth. 10/06/19   [provider]  pantoprazole  (PROTONIX ) 40 MG tablet Take 1 tablet by mouth daily. 03/25/20   [provider]  Potassium Chloride  ER 20 MEQ TBCR Take 20 mEq by mouth daily. 11/23/14   [provider]  pravastatin (PRAVACHOL) 40 MG tablet Take 40 mg by mouth at bedtime.  11/28/14 05/14/23  [provider]  prednisoLONE  acetate (PRED FORTE ) 1 % ophthalmic suspension 1 drop 4 (four) times daily. 03/01/20   [provider]  promethazine  (PHENERGAN ) 25 MG tablet Take 1 tablet (25 mg total) by mouth every 6 (six) hours as needed for nausea or vomiting. 07/21/16   Early Glisson, MD  rOPINIRole  (REQUIP ) 2 MG tablet Take 2 mg by mouth 2 (two) times daily. 11/11/14   [provider]  simethicone (GAS RELIEF EXTRA STRENGTH) 125 MG chewable tablet Chew 125 mg by mouth daily as needed for flatulence.  05/24/14   [provider]      Allergies    Buprenorphine hcl, Bupropion, Codeine, Pregabalin, Cymbalta [duloxetine hcl], Diclofenac, Diclofenac sodium, Diclofenac sodium, Gabapentin, Nsaids, Sulfa antibiotics, Tapentadol, Clindamycin, Clindamycin/lincomycin, and Toradol  [ketorolac  tromethamine ]    Review of Systems   Review of Systems  All other systems reviewed and are negative.   Physical Exam Updated Vital Signs BP (!) 162/116   Pulse (!) 56   Temp 98.8 F (37.1 C)   Resp 18   SpO2 98%  Physical Exam Vitals and nursing note reviewed.   63 year old female, resting comfortably and in no acute distress. Vital signs are significant for elevated blood pressure and borderline slow heart rate. Oxygen saturation is 98%, which is normal. Head is normocephalic and atraumatic. PERRLA, EOMI. Oropharynx is clear. Neck is nontender and supple without adenopathy. Lungs are clear without rales, wheezes, or rhonchi. Chest is nontender. Heart has regular rate and rhythm without murmur. Abdomen is soft, flat, nontender. Extremities have no cyanosis or edema, full range of motion is present. Skin is warm and dry without rash. Neurologic: Mental status is normal, mild left sided facial droop noted.  Strength is 5/5 in all 4 extremities.  ED Results / Procedures / Treatments   Labs (all labs ordered are listed, but only abnormal results are  displayed) Labs Reviewed - No data to display  EKG None  Radiology No results found.  Procedures Procedures  {Document cardiac monitor, telemetry assessment procedure when appropriate:1}  Medications Ordered in ED Medications - No data to display  ED Course/ Medical Decision Making/ A&P   {   Click here for ABCD2, HEART and other calculatorsREFRESH Note before signing :1}                              Medical Decision Making  Poorly controlled hypertension in the setting of flareup of left-sided trigeminal neuralgia.  I suspect that her pain is the main reason for her blood pressure worsening and I have ordered a dose of oxycodone -acetaminophen .  She takes morphine  at baseline, is intolerant of NSAIDs.  I have reviewed her past records and note hospitalization 05/13/2023-05/16/2023 for hypokalemia, poorly controlled hypertension, trigeminal neuralgia.  I have ordered CBC, basic metabolic panel, magnesium  level.  {Document critical care time when appropriate:1} {Document review of labs and clinical decision tools  ie heart score, Chads2Vasc2 etc:1}  {Document your independent review of radiology images, and any outside records:1} {Document your discussion with family members, caretakers, and with consultants:1} {Document social determinants of health affecting pt's care:1} {Document your decision making why or why not admission, treatments were needed:1} Final Clinical Impression(s) / ED Diagnoses Final diagnoses:  None    Rx / DC Orders ED Discharge Orders     None

## 2023-06-12 NOTE — ED Triage Notes (Signed)
 Pt is coming from home with concerns of her blood pressure being high, She reports a blood pressure 200/110 was the reported pressures at home. She also has complaints of her Trigeminal neuralgia. She was admitted recently for the blood pressure.

## 2023-06-13 DIAGNOSIS — I10 Essential (primary) hypertension: Secondary | ICD-10-CM | POA: Diagnosis not present

## 2023-06-13 LAB — MAGNESIUM: Magnesium: 1.8 mg/dL (ref 1.7–2.4)

## 2023-06-13 LAB — CBC WITH DIFFERENTIAL/PLATELET
Abs Immature Granulocytes: 0.03 10*3/uL (ref 0.00–0.07)
Basophils Absolute: 0.1 10*3/uL (ref 0.0–0.1)
Basophils Relative: 1 %
Eosinophils Absolute: 0.1 10*3/uL (ref 0.0–0.5)
Eosinophils Relative: 1 %
HCT: 42.2 % (ref 36.0–46.0)
Hemoglobin: 13.6 g/dL (ref 12.0–15.0)
Immature Granulocytes: 0 %
Lymphocytes Relative: 38 %
Lymphs Abs: 3.6 10*3/uL (ref 0.7–4.0)
MCH: 28.5 pg (ref 26.0–34.0)
MCHC: 32.2 g/dL (ref 30.0–36.0)
MCV: 88.3 fL (ref 80.0–100.0)
Monocytes Absolute: 0.6 10*3/uL (ref 0.1–1.0)
Monocytes Relative: 7 %
Neutro Abs: 5 10*3/uL (ref 1.7–7.7)
Neutrophils Relative %: 53 %
Platelets: 238 10*3/uL (ref 150–400)
RBC: 4.78 MIL/uL (ref 3.87–5.11)
RDW: 14.4 % (ref 11.5–15.5)
WBC: 9.4 10*3/uL (ref 4.0–10.5)
nRBC: 0 % (ref 0.0–0.2)

## 2023-06-13 LAB — BASIC METABOLIC PANEL WITH GFR
Anion gap: 8 (ref 5–15)
BUN: 9 mg/dL (ref 8–23)
CO2: 27 mmol/L (ref 22–32)
Calcium: 9.6 mg/dL (ref 8.9–10.3)
Chloride: 104 mmol/L (ref 98–111)
Creatinine, Ser: 0.75 mg/dL (ref 0.44–1.00)
GFR, Estimated: >60 mL/min (ref >60)
Glucose, Bld: 106 mg/dL — ABNORMAL HIGH (ref 70–99)
Potassium: 2.6 mmol/L — CL (ref 3.5–5.1)
Sodium: 139 mmol/L (ref 135–145)

## 2023-06-13 MED ORDER — POTASSIUM CHLORIDE CRYS ER 20 MEQ PO TBCR
40.0000 meq | EXTENDED_RELEASE_TABLET | Freq: Once | ORAL | Status: AC
  Administered 2023-06-13: 40 meq via ORAL
  Filled 2023-06-13: qty 2

## 2023-06-13 MED ORDER — POTASSIUM CHLORIDE ER 20 MEQ PO TBCR: EXTENDED_RELEASE_TABLET | ORAL | 0 refills | Status: DC

## 2023-06-13 MED ORDER — ONDANSETRON HCL 4 MG/2ML IJ SOLN
4.0000 mg | Freq: Once | INTRAMUSCULAR | Status: AC
  Administered 2023-06-13: 4 mg via INTRAVENOUS
  Filled 2023-06-13: qty 2

## 2023-06-13 MED ORDER — MAGNESIUM SULFATE 2 GM/50ML IV SOLN
2.0000 g | Freq: Once | INTRAVENOUS | Status: AC
Start: 2023-06-13 — End: 2023-06-13
  Administered 2023-06-13: 2 g via INTRAVENOUS
  Filled 2023-06-13: qty 50

## 2023-06-13 MED ORDER — HYDRALAZINE HCL 20 MG/ML IJ SOLN
10.0000 mg | Freq: Once | INTRAMUSCULAR | Status: AC
  Administered 2023-06-13: 10 mg via INTRAVENOUS
  Filled 2023-06-13: qty 1

## 2023-06-13 MED ORDER — OXYCODONE HCL 10 MG PO TABS: 10.0000 mg | ORAL_TABLET | ORAL | 0 refills | Status: DC | PRN

## 2023-06-13 MED ORDER — HYDROMORPHONE HCL 1 MG/ML IJ SOLN
1.0000 mg | Freq: Once | INTRAMUSCULAR | Status: AC
  Administered 2023-06-13: 1 mg via INTRAVENOUS
  Filled 2023-06-13: qty 1

## 2023-06-13 MED ORDER — POTASSIUM CHLORIDE 10 MEQ/100ML IV SOLN
10.0000 meq | INTRAVENOUS | Status: AC
  Administered 2023-06-13 (×3): 10 meq via INTRAVENOUS
  Filled 2023-06-13 (×3): qty 100

## 2023-06-13 NOTE — Discharge Instructions (Addendum)
 Please work with your pain management team regarding adjusting your medications to keep your pain under control.  Please work with your primary care provider to adjust your medications to keep your blood pressure under control.  Return if symptoms or not being adequately controlled at home.

## 2023-07-06 ENCOUNTER — Emergency Department (HOSPITAL_COMMUNITY)

## 2023-07-06 ENCOUNTER — Other Ambulatory Visit: Payer: Self-pay

## 2023-07-06 ENCOUNTER — Encounter (HOSPITAL_COMMUNITY): Payer: Self-pay

## 2023-07-06 ENCOUNTER — Emergency Department (HOSPITAL_COMMUNITY)
Admission: EM | Admit: 2023-07-06 | Discharge: 2023-07-07 | Disposition: A | Discharge status: Home / Self Care | Attending: Emergency Medicine | Admitting: Emergency Medicine

## 2023-07-06 DIAGNOSIS — Z7982 Long term (current) use of aspirin: Secondary | ICD-10-CM | POA: Diagnosis not present

## 2023-07-06 DIAGNOSIS — Z79899 Other long term (current) drug therapy: Secondary | ICD-10-CM | POA: Diagnosis not present

## 2023-07-06 DIAGNOSIS — I10 Essential (primary) hypertension: Secondary | ICD-10-CM | POA: Diagnosis not present

## 2023-07-06 DIAGNOSIS — R519 Headache, unspecified: Secondary | ICD-10-CM | POA: Diagnosis present

## 2023-07-06 DIAGNOSIS — I160 Hypertensive urgency: Secondary | ICD-10-CM | POA: Insufficient documentation

## 2023-07-06 LAB — COMPREHENSIVE METABOLIC PANEL WITH GFR
ALT: 12 U/L (ref 0–44)
AST: 22 U/L (ref 15–41)
Albumin: 3.8 g/dL (ref 3.5–5.0)
Alkaline Phosphatase: 56 U/L (ref 38–126)
Anion gap: 7 (ref 5–15)
BUN: 11 mg/dL (ref 8–23)
CO2: 26 mmol/L (ref 22–32)
Calcium: 9.3 mg/dL (ref 8.9–10.3)
Chloride: 104 mmol/L (ref 98–111)
Creatinine, Ser: 0.71 mg/dL (ref 0.44–1.00)
GFR, Estimated: >60 mL/min (ref >60)
Glucose, Bld: 95 mg/dL (ref 70–99)
Potassium: 3.3 mmol/L — ABNORMAL LOW (ref 3.5–5.1)
Sodium: 137 mmol/L (ref 135–145)
Total Bilirubin: 0.3 mg/dL (ref 0.0–1.2)
Total Protein: 6.4 g/dL — ABNORMAL LOW (ref 6.5–8.1)

## 2023-07-06 LAB — CBC WITH DIFFERENTIAL/PLATELET
Abs Immature Granulocytes: 0.03 10*3/uL (ref 0.00–0.07)
Basophils Absolute: 0.1 10*3/uL (ref 0.0–0.1)
Basophils Relative: 1 %
Eosinophils Absolute: 0.1 10*3/uL (ref 0.0–0.5)
Eosinophils Relative: 1 %
HCT: 41.5 % (ref 36.0–46.0)
Hemoglobin: 13.3 g/dL (ref 12.0–15.0)
Immature Granulocytes: 0 %
Lymphocytes Relative: 27 %
Lymphs Abs: 2.1 10*3/uL (ref 0.7–4.0)
MCH: 28.5 pg (ref 26.0–34.0)
MCHC: 32 g/dL (ref 30.0–36.0)
MCV: 89.1 fL (ref 80.0–100.0)
Monocytes Absolute: 0.5 10*3/uL (ref 0.1–1.0)
Monocytes Relative: 6 %
Neutro Abs: 4.9 10*3/uL (ref 1.7–7.7)
Neutrophils Relative %: 65 %
Platelets: 306 10*3/uL (ref 150–400)
RBC: 4.66 MIL/uL (ref 3.87–5.11)
RDW: 14.4 % (ref 11.5–15.5)
WBC: 7.8 10*3/uL (ref 4.0–10.5)
nRBC: 0 % (ref 0.0–0.2)

## 2023-07-06 LAB — CBG MONITORING, ED: Glucose-Capillary: 90 mg/dL (ref 70–99)

## 2023-07-06 LAB — MAGNESIUM: Magnesium: 1.9 mg/dL (ref 1.7–2.4)

## 2023-07-06 LAB — TROPONIN I (HIGH SENSITIVITY)
Troponin I (High Sensitivity): 5 ng/L (ref <18)
Troponin I (High Sensitivity): 5 ng/L (ref <18)

## 2023-07-06 MED ORDER — DIPHENHYDRAMINE HCL 50 MG/ML IJ SOLN
12.5000 mg | Freq: Once | INTRAMUSCULAR | Status: AC
  Administered 2023-07-06: 12.5 mg via INTRAVENOUS
  Filled 2023-07-06: qty 1

## 2023-07-06 MED ORDER — POTASSIUM CHLORIDE CRYS ER 20 MEQ PO TBCR
40.0000 meq | EXTENDED_RELEASE_TABLET | Freq: Once | ORAL | Status: AC
  Administered 2023-07-06: 40 meq via ORAL
  Filled 2023-07-06: qty 2

## 2023-07-06 MED ORDER — PROCHLORPERAZINE EDISYLATE 10 MG/2ML IJ SOLN
10.0000 mg | Freq: Once | INTRAMUSCULAR | Status: AC
  Administered 2023-07-06: 10 mg via INTRAVENOUS
  Filled 2023-07-06: qty 2

## 2023-07-06 MED ORDER — MORPHINE SULFATE 15 MG PO TABS
15.0000 mg | ORAL_TABLET | Freq: Once | ORAL | Status: AC
  Administered 2023-07-06: 15 mg via ORAL
  Filled 2023-07-06: qty 1

## 2023-07-06 MED ORDER — HYDROMORPHONE HCL 1 MG/ML IJ SOLN
0.5000 mg | Freq: Once | INTRAMUSCULAR | Status: AC
  Administered 2023-07-06: 0.5 mg via INTRAVENOUS
  Filled 2023-07-06: qty 0.5

## 2023-07-06 MED ORDER — FENTANYL CITRATE PF 50 MCG/ML IJ SOSY: 50.0000 ug | PREFILLED_SYRINGE | INTRAMUSCULAR | Status: DC | PRN

## 2023-07-06 MED ORDER — HYDROMORPHONE HCL 1 MG/ML IJ SOLN
1.0000 mg | Freq: Once | INTRAMUSCULAR | Status: AC
  Administered 2023-07-06: 1 mg via INTRAVENOUS
  Filled 2023-07-06: qty 1

## 2023-07-06 MED ORDER — FENTANYL CITRATE PF 50 MCG/ML IJ SOSY
PREFILLED_SYRINGE | INTRAMUSCULAR | Status: AC
  Administered 2023-07-06: 50 ug via INTRAVENOUS
  Filled 2023-07-06: qty 1

## 2023-07-06 MED ORDER — MORPHINE SULFATE ER 15 MG PO TBCR
15.0000 mg | EXTENDED_RELEASE_TABLET | Freq: Once | ORAL | Status: AC
  Administered 2023-07-06: 15 mg via ORAL
  Filled 2023-07-06: qty 1

## 2023-07-06 NOTE — ED Provider Notes (Signed)
 [place] EMERGENCY DEPARTMENT AT [facility] Provider Note   CSN: [id number] Arrival date & time: 07/06/23  1850     History  Chief Complaint  Patient presents with   Trigeminal Neuralgia    Renee Gutierrez is a 63 y.o. female.  HPI 63 year old female with a history of hypertension, AVM requiring surgical repair, trigeminal neuralgia and other comorbidities presents with worsening left facial pain.  She reports this is coming from her chronic trigeminal neuralgia.  She is typically taking morphine  at home though has recently run out.  However she states it never helped anyway.  Symptoms started getting worse last night.  She also has a history of chronically difficult to control blood pressure though reports compliance with all of her blood pressure meds.  She is currently having tingling and numbness in all 4 hands and feet but denies any focal weakness.  She has chronic left-sided facial weakness and I abnormalities since her surgery.  These are unchanged from baseline.  However the headache and facial pain are worse than they typically ever are.  She does endorses a little bit of chest pain that started this afternoon. Feels dull.  Home Medications Prior to Admission medications   Medication Sig Start Date End Date Taking? Authorizing Provider  amLODipine  (NORVASC ) 10 MG tablet Take by mouth. 04/09/20   [provider]  aspirin EC 81 MG tablet Take 81 mg by mouth daily.  09/21/13   [provider]  aspirin-acetaminophen -caffeine (EXCEDRIN MIGRAINE) 250-250-65 MG tablet Take 2 tablets by mouth daily as needed. For pain 05/24/14   [provider]  atorvastatin (LIPITOR) 20 MG tablet Take 1 tablet by mouth daily.    [provider]  cloNIDine  (CATAPRES ) 0.2 MG tablet Take 0.2 mg by mouth 2 (two) times daily.    [provider]  diclofenac Sodium (VOLTAREN) 1 % GEL Apply 2 g topically 4 (four) times daily. 04/18/20   [provider]  DULoxetine (CYMBALTA) 60 MG capsule Take by mouth. 05/16/20   [provider]  erythromycin  ophthalmic ointment PLACE INTO THE LEFT EYE 3  TIMES DAILY Patient not taking: Reported on 05/14/2023 04/16/18   [provider]  ferrous sulfate 325 (65 FE) MG tablet Take 1 tablet by mouth daily.    [provider]  Fluticasone Furoate-Vilanterol (BREO ELLIPTA) 100-25 MCG/INH AEPB Inhale 1 puff into the lungs daily. Patient not taking: Reported on 05/14/2023 04/25/14   [provider]  ipratropium-albuterol (DUONEB) 0.5-2.5 (3) MG/3ML SOLN Take 3 mLs by nebulization 4 (four) times daily. Patient not taking: Reported on 05/14/2023    [provider]  labetalol  (NORMODYNE ) 200 MG tablet Take 200 mg by mouth 2 (two) times daily.    [provider]  lisinopril  (PRINIVIL ,ZESTRIL ) 40 MG tablet Take 40 mg by mouth daily. 01/31/14 05/14/23  [provider]  mirtazapine  (REMERON ) 15 MG tablet Take 1 tablet (15 mg total) by mouth at bedtime as needed (insomnia). 05/16/23   Wynetta Heckle, MD  morphine  (MS CONTIN ) 15 MG 12 hr tablet Take 15 mg by mouth every 12 (twelve) hours.    [provider]  morphine  (MSIR) 15 MG tablet Take by mouth. 05/08/20   [provider]  mupirocin ointment (BACTROBAN) 2 % Apply topically. 03/25/16   [provider]  naloxone [facility]) nasal spray 4 mg/0.1 mL Place into the nose. 09/17/18   [provider]  ondansetron  (ZOFRAN ) 4 MG tablet Take 4 mg by mouth  every 8 (eight) hours as needed.    [provider]  orphenadrine (NORFLEX) 100 MG tablet Take by mouth. 10/06/19   [provider]  oxyCODONE  10 MG TABS Take 1 tablet (10 mg total) by mouth every 4 (four) hours as needed for severe pain (pain score 7-10). 06/13/23   Alissa April, MD  pantoprazole  (PROTONIX ) 40 MG tablet Take 1 tablet by mouth daily. 03/25/20   [provider]  Potassium Chloride  ER 20 MEQ TBCR Take one  tablet twice a day for ten days, then take one tablet every day 06/13/23   Glick, David, MD  pravastatin (PRAVACHOL) 40 MG tablet Take 40 mg by mouth at bedtime. 11/28/14 05/14/23  [provider]  prednisoLONE  acetate (PRED FORTE ) 1 % ophthalmic suspension 1 drop 4 (four) times daily. 03/01/20   [provider]  promethazine  (PHENERGAN ) 12.5 MG tablet Take 12.5 mg by mouth every 12 (twelve) hours as needed for nausea.    [provider]  rOPINIRole  (REQUIP ) 2 MG tablet Take 2 mg by mouth 2 (two) times daily. 11/11/14   [provider]  simethicone (GAS RELIEF EXTRA STRENGTH) 125 MG chewable tablet Chew 125 mg by mouth daily as needed for flatulence.  05/24/14   [provider]  Vitamin D, Ergocalciferol, (DRISDOL) 1.25 MG (50000 UNIT) CAPS capsule Take 50,000 Units by mouth once a week. 05/10/23   [provider]      Allergies    Buprenorphine hcl, Bupropion, Codeine, Sulfamethoxazole-trimethoprim, Nsaids, Pregabalin, Sulfa antibiotics, Tapentadol, Clindamycin, Clindamycin/lincomycin, Cymbalta [duloxetine hcl], Diclofenac, Diclofenac sodium, Gabapentin, Oxycodone , and Toradol  [ketorolac  tromethamine ]    Review of Systems   Review of Systems  Constitutional:  Negative for fever.  Cardiovascular:  Positive for chest pain.  Neurological:  Positive for numbness (tingling) and headaches. Negative for weakness.    Physical Exam Updated Vital Signs BP (!) 173/88   Pulse 66   Temp 97.9 F (36.6 C) (Axillary)   Resp (!) 28   Ht 4\' 11"  (1.499 m)   Wt 46.7 kg   SpO2 95%   BMI 20.80 kg/m  Physical Exam Vitals and nursing note reviewed.  Constitutional:      General: She is in acute distress (in severe pain, crying).     Appearance: She is well-developed. She is not diaphoretic.  HENT:     Head: Normocephalic and atraumatic.  Cardiovascular:     Rate and Rhythm: Normal rate and regular rhythm.     Heart sounds: Normal heart sounds.   Pulmonary:     Effort: Pulmonary effort is normal.     Breath sounds: Normal breath sounds.  Abdominal:     General: There is no distension.  Skin:    General: Skin is warm and dry.  Neurological:     Mental Status: She is alert.     Comments: CN 3-12 grossly save for a left facial droop (mild) and chronic left eye changes. 5/5 strength in all 4 extremities. Grossly normal sensation. Normal finger to nose.      ED Results / Procedures / Treatments   Labs (all labs ordered are listed, but only abnormal results are displayed) Labs Reviewed  COMPREHENSIVE METABOLIC PANEL WITH GFR - Abnormal; Notable for the following components:      Result Value   Potassium 3.3 (*)    Total Protein 6.4 (*)    All other components within normal limits  CBC WITH DIFFERENTIAL/PLATELET  MAGNESIUM   CBG MONITORING, ED  TROPONIN I (HIGH  SENSITIVITY)  TROPONIN I (HIGH SENSITIVITY)    EKG EKG Interpretation Date/Time:  Sunday Jul 06 2023 19:36:14 EDT Ventricular Rate:  68 PR Interval:  134 QRS Duration:  81 QT Interval:  409 QTC Calculation: 435 R Axis:   78  Text Interpretation: Sinus rhythm Consider left ventricular hypertrophy no acute ST/T changes Confirmed by Jerilynn Montenegro [phone]) on 07/06/2023 8:08:01 PM  Radiology CT Head Wo Contrast Result Date: 07/06/2023 CLINICAL DATA:  Headache, sudden, severe EXAM: CT HEAD WITHOUT CONTRAST TECHNIQUE: Contiguous axial images were obtained from the base of the skull through the vertex without intravenous contrast. RADIATION DOSE REDUCTION: This exam was performed according to the departmental dose-optimization program which includes automated exposure control, adjustment of the mA and/or kV according to patient size and/or use of iterative reconstruction technique. COMPARISON:  CT head March 25, 25. FINDINGS: Brain: No evidence of acute infarction, hemorrhage, hydrocephalus, extra-axial collection or mass lesion/mass effect. Similar left cerebellar  encephalomalacia. Similar overlying retromastoid craniotomy. Vascular: No hyperdense vessel. Skull: Left retromastoid craniotomy.  No acute fracture Sinuses/Orbits: Mostly clear sinuses.  No acute orbital findings. IMPRESSION: 1. No evidence of acute intracranial abnormality. 2. 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 Electronically Signed   By: Stevenson Elbe M.D.   On: 07/06/2023 21:14   DG Chest Portable 1 View Result Date: 07/06/2023 CLINICAL DATA:  Chest pain, hypertension EXAM: PORTABLE CHEST 1 VIEW COMPARISON:  10/22/2015 FINDINGS: Lungs are well expanded, symmetric, and clear. No pneumothorax or pleural effusion. Cardiac size within normal limits. Pulmonary vascularity is normal. Osseous structures are age-appropriate. No acute bone abnormality. IMPRESSION: 1. No active disease. Electronically Signed   By: Worthy Heads M.D.   On: 07/06/2023 20:00    Procedures Procedures    Medications Ordered in ED Medications  morphine  (MS CONTIN ) 12 hr tablet 15 mg (has no administration in time range)  HYDROmorphone  (DILAUDID ) injection 0.5 mg (0.5 mg Intravenous Given 07/06/23 1932)  HYDROmorphone  (DILAUDID ) injection 1 mg (1 mg Intravenous Given 07/06/23 2003)  prochlorperazine  (COMPAZINE ) injection 10 mg (10 mg Intravenous Given 07/06/23 2031)  diphenhydrAMINE  (BENADRYL ) injection 12.5 mg (12.5 mg Intravenous Given 07/06/23 2031)  morphine  (MSIR) tablet 15 mg (15 mg Oral Given 07/06/23 2227)    ED Course/ Medical Decision Making/ A&P                                 Medical Decision Making Amount and/or Complexity of Data Reviewed External Data Reviewed: notes. Labs: ordered.    Details: Mild hypokalemia.  Normal troponins and normal WBC Radiology: ordered and independent interpretation performed.    Details: No head bleed, chronic findings ECG/medicine tests: ordered and independent interpretation performed.    Details: No  ischemia  Risk Prescription drug management.   Presents with acute on chronic pain in her left face.  This seems to be a chronic issue when reviewing the chart.  She is on morphine  as an outpatient but seems like she has run out early and is due to see her provider in the next couple days.  We discussed how I cannot refill these meds but after multiple different treatments, her headache is now under control.  Her blood pressure has also come down somewhat and I think it has come down well enough for her to be discharged.  Chest pain is vague and she has 2 negative troponins and an unremarkable EKG.  I doubt ACS.  Doubt dissection.  Unlikely to be PE either.  From a headache perspective, CT head was obtained given the hypertension but is negative and she is feeling better and the seems to be more of a chronic problem.  She was given some of her oral morphine  here.  Her blood pressure has come down to 173/88.  She has not yet taken her evening medicines and wants to take those at home which I think is reasonable.  I do not think this is a hypertensive emergency.  Will discharge home with return precautions.        Final Clinical Impression(s) / ED Diagnoses Final diagnoses:  Facial pain  Hypertensive urgency    Rx / DC Orders ED Discharge Orders     None         Jerilynn Montenegro, MD 07/06/23 2319

## 2023-07-06 NOTE — ED Notes (Signed)
 Patient transported to CT

## 2023-07-06 NOTE — Discharge Instructions (Signed)
 Call your doctor tomorrow to discuss your elevated blood pressure.  Be sure to take your nighttime blood pressure medicines when you get home.  You develop new or worsening headache, vomiting, chest pain, or any other new/concerning symptoms then return to the ER.

## 2023-07-06 NOTE — ED Triage Notes (Signed)
 Pt with a hx of trigeminal neuralgia presents today with L side facial pain and drooping that started last night. She also reports that here BP was elevated at home with readings over 220/110. She had a hospitalization about 3 weeks ago for the same thing. She has some generalized weakness in bilateral legs, but no other focal neuro deficits.

## 2023-09-18 ENCOUNTER — Emergency Department (HOSPITAL_COMMUNITY)
Admission: EM | Admit: 2023-09-18 | Discharge: 2023-09-18 | Discharge status: Against Medical Advice | Payer: Medicare (Managed Care) | Attending: Emergency Medicine | Admitting: Emergency Medicine

## 2023-09-18 ENCOUNTER — Encounter (HOSPITAL_COMMUNITY): Payer: Self-pay

## 2023-09-18 DIAGNOSIS — W19XXXA Unspecified fall, initial encounter: Secondary | ICD-10-CM | POA: Insufficient documentation

## 2023-09-18 DIAGNOSIS — M25552 Pain in left hip: Secondary | ICD-10-CM | POA: Diagnosis not present

## 2023-09-18 DIAGNOSIS — R110 Nausea: Secondary | ICD-10-CM | POA: Insufficient documentation

## 2023-09-18 DIAGNOSIS — R42 Dizziness and giddiness: Secondary | ICD-10-CM | POA: Diagnosis not present

## 2023-09-18 DIAGNOSIS — Z5321 Procedure and treatment not carried out due to patient leaving prior to being seen by health care provider: Secondary | ICD-10-CM | POA: Insufficient documentation

## 2023-09-18 DIAGNOSIS — M25512 Pain in left shoulder: Secondary | ICD-10-CM | POA: Insufficient documentation

## 2023-09-18 NOTE — ED Triage Notes (Signed)
 Pt comes in after a fall from a wk ago. Pt hit left side of her head, left shoulder and hip. Pt states she can hardly keep anything down. Pt comes in now because she becomes her lightheaded when she walks. Pt walks with a walker at baseline. Pt has a hx of brain surgery in 2018 and 2019. Pt is NOT on blood thinners.

## 2023-09-20 ENCOUNTER — Emergency Department (HOSPITAL_COMMUNITY)

## 2023-09-20 ENCOUNTER — Emergency Department (HOSPITAL_COMMUNITY)
Admission: EM | Admit: 2023-09-20 | Discharge: 2023-09-20 | Disposition: A | Discharge status: Home / Self Care | Attending: Student | Admitting: Student

## 2023-09-20 ENCOUNTER — Encounter (HOSPITAL_COMMUNITY): Payer: Self-pay | Admitting: Emergency Medicine

## 2023-09-20 ENCOUNTER — Other Ambulatory Visit: Payer: Self-pay

## 2023-09-20 DIAGNOSIS — R932 Abnormal findings on diagnostic imaging of liver and biliary tract: Secondary | ICD-10-CM | POA: Diagnosis not present

## 2023-09-20 DIAGNOSIS — R1013 Epigastric pain: Secondary | ICD-10-CM | POA: Diagnosis not present

## 2023-09-20 DIAGNOSIS — R109 Unspecified abdominal pain: Secondary | ICD-10-CM | POA: Diagnosis not present

## 2023-09-20 DIAGNOSIS — S32028G Other fracture of second lumbar vertebra, subsequent encounter for fracture with delayed healing: Secondary | ICD-10-CM | POA: Insufficient documentation

## 2023-09-20 DIAGNOSIS — Z79891 Long term (current) use of opiate analgesic: Secondary | ICD-10-CM | POA: Diagnosis not present

## 2023-09-20 DIAGNOSIS — R112 Nausea with vomiting, unspecified: Secondary | ICD-10-CM | POA: Insufficient documentation

## 2023-09-20 DIAGNOSIS — S32018G Other fracture of first lumbar vertebra, subsequent encounter for fracture with delayed healing: Secondary | ICD-10-CM | POA: Diagnosis not present

## 2023-09-20 DIAGNOSIS — M542 Cervicalgia: Secondary | ICD-10-CM | POA: Diagnosis not present

## 2023-09-20 DIAGNOSIS — Z9889 Other specified postprocedural states: Secondary | ICD-10-CM | POA: Diagnosis not present

## 2023-09-20 DIAGNOSIS — I700 Atherosclerosis of aorta: Secondary | ICD-10-CM | POA: Diagnosis not present

## 2023-09-20 DIAGNOSIS — Z87891 Personal history of nicotine dependence: Secondary | ICD-10-CM | POA: Diagnosis not present

## 2023-09-20 DIAGNOSIS — M25512 Pain in left shoulder: Secondary | ICD-10-CM | POA: Insufficient documentation

## 2023-09-20 DIAGNOSIS — R933 Abnormal findings on diagnostic imaging of other parts of digestive tract: Secondary | ICD-10-CM | POA: Diagnosis not present

## 2023-09-20 DIAGNOSIS — G500 Trigeminal neuralgia: Secondary | ICD-10-CM | POA: Insufficient documentation

## 2023-09-20 DIAGNOSIS — X58XXXD Exposure to other specified factors, subsequent encounter: Secondary | ICD-10-CM | POA: Insufficient documentation

## 2023-09-20 DIAGNOSIS — Z79899 Other long term (current) drug therapy: Secondary | ICD-10-CM | POA: Insufficient documentation

## 2023-09-20 DIAGNOSIS — E785 Hyperlipidemia, unspecified: Secondary | ICD-10-CM | POA: Insufficient documentation

## 2023-09-20 DIAGNOSIS — S32048G Other fracture of fourth lumbar vertebra, subsequent encounter for fracture with delayed healing: Secondary | ICD-10-CM | POA: Diagnosis not present

## 2023-09-20 DIAGNOSIS — Z7982 Long term (current) use of aspirin: Secondary | ICD-10-CM | POA: Insufficient documentation

## 2023-09-20 DIAGNOSIS — Z981 Arthrodesis status: Secondary | ICD-10-CM | POA: Diagnosis not present

## 2023-09-20 DIAGNOSIS — R519 Headache, unspecified: Secondary | ICD-10-CM | POA: Diagnosis not present

## 2023-09-20 DIAGNOSIS — I10 Essential (primary) hypertension: Secondary | ICD-10-CM | POA: Insufficient documentation

## 2023-09-20 DIAGNOSIS — W19XXXA Unspecified fall, initial encounter: Secondary | ICD-10-CM | POA: Insufficient documentation

## 2023-09-20 DIAGNOSIS — S0990XA Unspecified injury of head, initial encounter: Secondary | ICD-10-CM | POA: Diagnosis present

## 2023-09-20 LAB — CBC WITH DIFFERENTIAL/PLATELET
Abs Immature Granulocytes: 0.07 K/uL (ref 0.00–0.07)
Basophils Absolute: 0.1 K/uL (ref 0.0–0.1)
Basophils Relative: 1 %
Eosinophils Absolute: 0.5 K/uL (ref 0.0–0.5)
Eosinophils Relative: 4 %
HCT: 33.2 % — ABNORMAL LOW (ref 36.0–46.0)
Hemoglobin: 10.8 g/dL — ABNORMAL LOW (ref 12.0–15.0)
Immature Granulocytes: 1 %
Lymphocytes Relative: 24 %
Lymphs Abs: 2.5 K/uL (ref 0.7–4.0)
MCH: 29.8 pg (ref 26.0–34.0)
MCHC: 32.5 g/dL (ref 30.0–36.0)
MCV: 91.7 fL (ref 80.0–100.0)
Monocytes Absolute: 0.8 K/uL (ref 0.1–1.0)
Monocytes Relative: 8 %
Neutro Abs: 6.5 K/uL (ref 1.7–7.7)
Neutrophils Relative %: 62 %
Platelets: 330 K/uL (ref 150–400)
RBC: 3.62 MIL/uL — ABNORMAL LOW (ref 3.87–5.11)
RDW: 14.4 % (ref 11.5–15.5)
WBC: 10.5 K/uL (ref 4.0–10.5)
nRBC: 0 % (ref 0.0–0.2)

## 2023-09-20 LAB — COMPREHENSIVE METABOLIC PANEL WITH GFR
ALT: 8 U/L (ref 0–44)
AST: 12 U/L — ABNORMAL LOW (ref 15–41)
Albumin: 2.9 g/dL — ABNORMAL LOW (ref 3.5–5.0)
Alkaline Phosphatase: 54 U/L (ref 38–126)
Anion gap: 12 (ref 5–15)
BUN: 18 mg/dL (ref 8–23)
CO2: 24 mmol/L (ref 22–32)
Calcium: 9.7 mg/dL (ref 8.9–10.3)
Chloride: 106 mmol/L (ref 98–111)
Creatinine, Ser: 0.65 mg/dL (ref 0.44–1.00)
GFR, Estimated: >60 mL/min (ref >60)
Glucose, Bld: 91 mg/dL (ref 70–99)
Potassium: 3.7 mmol/L (ref 3.5–5.1)
Sodium: 142 mmol/L (ref 135–145)
Total Bilirubin: 0.6 mg/dL (ref 0.0–1.2)
Total Protein: 5.5 g/dL — ABNORMAL LOW (ref 6.5–8.1)

## 2023-09-20 LAB — RESP PANEL BY RT-PCR (RSV, FLU A&B, COVID)  RVPGX2
Influenza A by PCR: NEGATIVE
Influenza B by PCR: NEGATIVE
Resp Syncytial Virus by PCR: NEGATIVE
SARS Coronavirus 2 by RT PCR: NEGATIVE

## 2023-09-20 LAB — LACTIC ACID, PLASMA
Lactic Acid, Venous: 1.2 mmol/L (ref 0.5–1.9)
Lactic Acid, Venous: 1.3 mmol/L (ref 0.5–1.9)

## 2023-09-20 MED ORDER — ONDANSETRON HCL 4 MG/2ML IJ SOLN
4.0000 mg | Freq: Once | INTRAMUSCULAR | Status: AC
  Administered 2023-09-20: 4 mg via INTRAVENOUS
  Filled 2023-09-20: qty 2

## 2023-09-20 MED ORDER — MORPHINE SULFATE (PF) 4 MG/ML IV SOLN
4.0000 mg | Freq: Once | INTRAVENOUS | Status: AC
  Administered 2023-09-20: 4 mg via INTRAVENOUS
  Filled 2023-09-20: qty 1

## 2023-09-20 MED ORDER — IOHEXOL 300 MG/ML  SOLN
80.0000 mL | Freq: Once | INTRAMUSCULAR | Status: AC | PRN
  Administered 2023-09-20: 80 mL via INTRAVENOUS

## 2023-09-20 MED ORDER — CLONIDINE HCL 0.2 MG PO TABS
0.2000 mg | ORAL_TABLET | Freq: Two times a day (BID) | ORAL | Status: DC
  Filled 2023-09-20: qty 1

## 2023-09-20 MED ORDER — PANTOPRAZOLE SODIUM 40 MG PO TBEC: 40.0000 mg | DELAYED_RELEASE_TABLET | Freq: Every day | ORAL | 1 refills | Status: DC

## 2023-09-20 MED ORDER — CLONIDINE HCL 0.2 MG PO TABS
0.2000 mg | ORAL_TABLET | Freq: Two times a day (BID) | ORAL | Status: DC
  Administered 2023-09-20: 0.2 mg via ORAL

## 2023-09-20 MED ORDER — LABETALOL HCL 200 MG PO TABS
200.0000 mg | ORAL_TABLET | Freq: Once | ORAL | Status: AC
  Administered 2023-09-20: 200 mg via ORAL
  Filled 2023-09-20: qty 1

## 2023-09-20 MED ORDER — MORPHINE SULFATE ER 15 MG PO TBCR: 15.0000 mg | EXTENDED_RELEASE_TABLET | Freq: Two times a day (BID) | ORAL | Status: DC

## 2023-09-20 MED ORDER — LABETALOL HCL 200 MG PO TABS: 200.0000 mg | ORAL_TABLET | Freq: Two times a day (BID) | ORAL | Status: DC

## 2023-09-20 MED ORDER — LABETALOL HCL 5 MG/ML IV SOLN
5.0000 mg | Freq: Once | INTRAVENOUS | Status: AC
  Administered 2023-09-20: 5 mg via INTRAVENOUS
  Filled 2023-09-20: qty 4

## 2023-09-20 MED ORDER — LISINOPRIL 10 MG PO TABS
40.0000 mg | ORAL_TABLET | Freq: Every day | ORAL | Status: DC
  Administered 2023-09-20: 40 mg via ORAL
  Filled 2023-09-20: qty 4

## 2023-09-20 NOTE — ED Provider Notes (Signed)
 Physical Exam  BP (!) 184/84 (BP Location: Left Arm)   Pulse 72   Temp (!) 100.9 F (38.3 C) (Oral)   Resp 15   Ht 4' 11 (1.499 m)   Wt 45 kg   SpO2 98%   BMI 20.04 kg/m   Physical Exam Vitals and nursing note reviewed.  Constitutional:      General: She is not in acute distress.    Appearance: She is well-developed.  HENT:     Head: Normocephalic and atraumatic.  Eyes:     Conjunctiva/sclera: Conjunctivae normal.  Cardiovascular:     Rate and Rhythm: Normal rate and regular rhythm.     Heart sounds: No murmur heard. Pulmonary:     Effort: Pulmonary effort is normal. No respiratory distress.     Breath sounds: Normal breath sounds.  Abdominal:     Palpations: Abdomen is soft.     Tenderness: There is no abdominal tenderness.  Musculoskeletal:        General: No swelling.     Cervical back: Neck supple.  Skin:    General: Skin is warm and dry.     Capillary Refill: Capillary refill takes less than 2 seconds.  Neurological:     Mental Status: She is alert.  Psychiatric:        Mood and Affect: Mood normal.     Procedures  Procedures  ED Course / MDM   Clinical Course as of 09/20/23 1024  Sat Sep 20, 2023  0818 Dr. Cinderella GI consultation. Needs EGD and MRCP.  [TL]    Clinical Course User Index [TL] Simon Lavonia SAILOR, MD   Medical Decision Making Amount and/or Complexity of Data Reviewed Labs: ordered. Radiology: ordered.  Risk Prescription drug management.    Presents because of fall.  [place]lemens about 1 week ago.  Has been having episodes of emesis as well.  CT imaging.  In terms of the CT imaging, patient has nonhealed fractures of L1-L3 transverse processes.  New since 2013.  I examined the patient.  She has no pain in the lumbar area.  She states that she does fall a lot.  This is likely old.  Do not think this is related to a most recent fall given no pain in this area at this moment in time.   In terms of the abnormal GE junction.  She does have a  history of fundoplication.  This completed back in 2009 up in [place] .  Loose like she may have a hiatal hernia superimposed on previous on fundoplication.  Pronounced abnormal soft tissue thickening and edema now throughout the fundoplication wrap.  Possible ulceration.  Trace left pleural effusion.  No pneumoperitoneum or pneumomediastinum identified.  Speaking the patient, she is been having episodes of emesis for the past 3 weeks.  Occasional dark emesis.  No dark stool. Spoke to  Dr. Mavis from general surgery.  GI consultation.  Likely needs EGD in this setting. No acute surgical intervention needed.  Spoke to Dr. Virgene.  No indication for inpatient admission at this point time.  She can follow-up with him in clinic this week.  Reviewed imaging himself.  Could be esophagitis.  As always patient is able to tolerate p.o. she can follow-up this week.  Will likely need MRCP as well given the dilated bile ducts.  Liver enzymes normal.  T. bili normal.  Patient able to tolerate p.o. here.  Was able to tolerate her p.o. antihypertensives.  Also able to tolerate crackers  as well as liquids.  Will start patient on PPI.  Currently not taking a PPI.  Has been off this medication for some time.    Return precautions.   Simon Lavonia SAILOR, MD 09/20/23 1026

## 2023-09-20 NOTE — ED Notes (Signed)
 Patient transported to CT

## 2023-09-20 NOTE — ED Triage Notes (Addendum)
 Pt comes in after a fall from a wk ago. Pt hit left side of her head, left shoulder and hip. Pt states she can hardly keep anything down. Pt comes in now because she becomes her lightheaded when she walks. Pt walks with a walker at baseline. Pt has a hx of brain surgery in 2018 and 2019. Pt is NOT on blood thinners.      Pt left states she came in last night but that she left before being seen because it was too full. Pt ambulatory to room without assistance.

## 2023-09-20 NOTE — ED Notes (Signed)
 Portable xray at bedside.

## 2023-09-20 NOTE — Discharge Instructions (Addendum)
 Please schedule an appointment with gastroenterology.  They are aware of you.  Please give them a call on Monday for further workup.  Will need an MRCP as well as endoscopy.   If you have any abdominal pain, increasing nausea or vomiting, increasing blood in vomit then please come back to ED for further evaluation.   Will start you on your Prilosec again.  Please take this daily.   There is no acute fractures on your CT imaging.  It does show some chronic changes as well as chronic fractures.  There is no indication of acute fractures this time.

## 2023-09-20 NOTE — ED Provider Notes (Signed)
 [place] EMERGENCY DEPARTMENT AT [facility] Provider Note  CSN: [id number] Arrival date & time: 09/20/23 9567  Chief Complaint(s) Fall  HPI Renee Gutierrez is a 63 y.o. female with PMH intracranial AVM status post repair, severe trigeminal neuralgia on chronic opioid therapy, HTN, HLD, migraines, Nissen fundoplication who presents emerged part for evaluation of a fall.  Patient states she fell about 1 week ago and landed on her face neck and shoulder on the left.  States that pain has been getting out of control and now she is having persistent vomiting.  States she is unable to keep anything down and is noted some coffee-ground emesis.  No blood thinner use.  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   Patient Active Problem List   Diagnosis Date Noted   Trigeminal neuralgia 05/13/2023   Anemia, unspecified 05/30/2020   Acute bronchitis 05/30/2020   Anxiety 05/30/2020   AVM (arteriovenous malformation) brain 05/30/2020   At risk for falls 05/30/2020   Cellulitis of nose 05/30/2020   Edema 05/30/2020   External otitis of left ear 05/30/2020   Hypokalemia 05/30/2020   Hyperlipidemia 05/30/2020   History of intussusception 05/30/2020   Migraines 05/30/2020   Restless legs syndrome 05/30/2020   Kidney disease, chronic, stage II (mild, 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) 05/30/2020   Fatigue 05/30/2020   Insomnia 05/30/2020   Intussusception (HCC) 05/30/2020   Lip laceration 05/30/2020    Medicare annual wellness visit, subsequent 05/30/2020   MRSA (methicillin resistant staph aureus) culture positive 05/30/2020   Rash 05/30/2020   Prediabetes 05/30/2020   Nausea 05/30/2020   Encounter for general adult medical examination without abnormal findings 05/30/2020   Chronic hip pain 05/30/2020   IBS (irritable bowel syndrome) 05/30/2020   Long-term current use of opiate analgesic 11/19/2018   ACTH elevation 12/12/2016   Trigeminal neuralgia pain 07/31/2016   Resistant hypertension 02/18/2016   Acute viral conjunctivitis of left eye 02/18/2016   Atypical facial pain 02/18/2016   Epistaxis 02/14/2016   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   Ganglion cyst of dorsum of right wrist 09/13/2015   Mild intermittent asthma without complication 04/25/2015   Idiopathic peripheral neuropathy 11/22/2014   Adrenal adenoma, right 07/10/2014   Prolactinoma (HCC) 07/07/2014   Osteoarthritis of cervical spine 06/06/2014   Myofascial pain syndrome 04/20/2014   Numbness in both hands 10/20/2013   Cubital tunnel syndrome 08/24/2013   Spinal stenosis of lumbar region with neurogenic claudication 08/03/2013   GAD (generalized anxiety disorder) 09/12/2011   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 Authorizing Provider  amLODipine  (NORVASC ) 10 MG tablet Take by mouth. 04/09/20   [provider]  aspirin EC 81 MG tablet Take 81 mg by mouth daily.  09/21/13   [provider]  aspirin-acetaminophen -caffeine (EXCEDRIN MIGRAINE) 250-250-65 MG tablet Take 2 tablets by mouth daily as needed. For pain 05/24/14   [provider]  atorvastatin (LIPITOR) 20 MG tablet Take 1 tablet by mouth daily.     [provider]  cloNIDine  (CATAPRES ) 0.2 MG tablet Take 0.2 mg by mouth 2 (two) times daily.    [provider]  diclofenac Sodium (VOLTAREN) 1 % GEL Apply 2 g topically 4 (four) times daily. 04/18/20   [provider]  DULoxetine (CYMBALTA) 60 MG capsule Take by mouth. 05/16/20   [provider]  erythromycin  ophthalmic ointment PLACE INTO THE LEFT EYE 3  TIMES DAILY Patient not taking: Reported on 05/14/2023 04/16/18   [provider]  ferrous sulfate 325 (65 FE) MG tablet Take 1 tablet by mouth daily.    [provider]  Fluticasone Furoate-Vilanterol (BREO ELLIPTA) 100-25 MCG/INH AEPB Inhale 1 puff into the lungs daily. Patient not taking: Reported on 05/14/2023 04/25/14   [provider]  ipratropium-albuterol (DUONEB) 0.5-2.5 (3) MG/3ML SOLN Take 3 mLs by nebulization 4 (four) times daily. Patient not taking: Reported on 05/14/2023    [provider]  labetalol  (NORMODYNE ) 200 MG tablet Take 200 mg by mouth 2 (two) times daily.    [provider]  lisinopril  (PRINIVIL ,ZESTRIL ) 40 MG tablet Take 40 mg by mouth daily. 01/31/14 05/14/23  [provider]  mirtazapine  (REMERON ) 15 MG tablet Take 1 tablet (15 mg total) by mouth at bedtime as needed (insomnia). 05/16/23   Bryn Bernardino NOVAK, MD  morphine  (MS CONTIN ) 15 MG 12 hr tablet Take 15 mg by mouth every 12 (twelve) hours.    [provider]  morphine  (MSIR) 15 MG tablet Take by mouth. 05/08/20   [provider]  mupirocin ointment (BACTROBAN) 2 % Apply topically. 03/25/16   [provider]  naloxone [facility]) nasal spray 4 mg/0.1 mL Place into the nose. 09/17/18   [provider]  ondansetron  (ZOFRAN ) 4 MG tablet Take 4 mg by mouth every 8 (eight) hours as needed.    [provider]  orphenadrine (NORFLEX) 100 MG tablet Take by mouth. 10/06/19   [provider]  oxyCODONE  10 MG TABS Take 1 tablet (10 mg total) by mouth  every 4 (four) hours as needed for severe pain (pain score 7-10). 06/13/23   Raford Lenis, MD  pantoprazole  (PROTONIX ) 40 MG tablet Take 1 tablet by mouth daily. 03/25/20   [provider]  Potassium Chloride  ER 20 MEQ TBCR Take one tablet twice a day for ten days, then take one tablet every day 06/13/23   Glick, David, MD  pravastatin (PRAVACHOL) 40 MG tablet Take 40 mg by mouth at bedtime. 11/28/14 05/14/23  [provider]  prednisoLONE  acetate (PRED FORTE ) 1 % ophthalmic suspension 1 drop 4 (four) times daily. 03/01/20   [provider]  promethazine  (PHENERGAN ) 12.5 MG tablet Take 12.5 mg by mouth every 12 (twelve) hours as needed for nausea.    [provider]  rOPINIRole  (REQUIP ) 2 MG tablet Take 2 mg by mouth 2 (two) times daily. 11/11/14   [provider]  simethicone (GAS RELIEF EXTRA STRENGTH) 125 MG chewable tablet Chew 125 mg by mouth daily as needed for flatulence.  05/24/14   [provider]  Vitamin D, Ergocalciferol, (DRISDOL) 1.25 MG (50000 UNIT) CAPS capsule Take 50,000 Units by mouth once a  week. 05/10/23   [provider]                                                                                                                                    Past Surgical History Past Surgical History:  Procedure Laterality Date   ABDOMINAL HYSTERECTOMY     ABDOMINAL SURGERY     tumor removed   APPENDECTOMY     BRAIN SURGERY     to fix AVM   CEREBRAL ANEURYSM REPAIR     CERVICAL FUSION     CHOLECYSTECTOMY     LUNG SURGERY     removed noncancerous mass from right lung   stent to esophagus     Family History History reviewed. No pertinent family history.  Social History Social History   Tobacco Use   Smoking status: Former    Types: Cigarettes   Smokeless tobacco: Never   Tobacco comments:    1 cigarette daily   Vaping Use   Vaping status: Never Used  Substance Use Topics   Alcohol use: No   Drug use: No    Allergies Buprenorphine hcl, Bupropion, Codeine, Sulfamethoxazole-trimethoprim, Nsaids, Pregabalin, Sulfa antibiotics, Tapentadol, Clindamycin, Clindamycin/lincomycin, Cymbalta [duloxetine hcl], Diclofenac, Diclofenac sodium, Gabapentin, Oxycodone , and Toradol  [ketorolac  tromethamine ]  Review of Systems Review of Systems  Gastrointestinal:  Positive for abdominal pain, nausea and vomiting.  Musculoskeletal:  Positive for arthralgias and neck pain.    Physical Exam Vital Signs  I have reviewed the triage vital signs BP (!) 200/97   Pulse 71   Temp (!) 100.9 F (38.3 C) (Oral)   Resp 17   Ht 4' 11 (1.499 m)   Wt 45 kg   SpO2 97%   BMI 20.04 kg/m   Physical Exam Vitals and nursing note reviewed.  Constitutional:      General: She is not in acute distress.    Appearance: She is well-developed.  HENT:     Head: Normocephalic and atraumatic.  Eyes:     Conjunctiva/sclera: Conjunctivae normal.  Cardiovascular:     Rate and Rhythm: Normal rate and regular rhythm.     Heart sounds: No murmur heard. Pulmonary:     Effort: Pulmonary effort is normal. No respiratory distress.     Breath sounds: Normal breath sounds.  Abdominal:     Palpations: Abdomen is soft.     Tenderness: There is abdominal tenderness.  Musculoskeletal:        General: Tenderness present. No swelling.     Cervical back: Neck supple.  Skin:    General: Skin is warm and dry.     Capillary Refill: Capillary refill takes less than 2 seconds.  Neurological:     Mental Status: She is alert.  Psychiatric:        Mood and Affect: Mood normal.     ED Results and Treatments Labs (all labs ordered are listed, but only abnormal results are  displayed) Labs Reviewed  COMPREHENSIVE METABOLIC PANEL WITH GFR - Abnormal; Notable for the following components:      Result Value   Total Protein 5.5 (*)    Albumin 2.9 (*)    AST 12 (*)    All other components within normal limits  CBC WITH DIFFERENTIAL/PLATELET  - Abnormal; Notable for the following components:   RBC 3.62 (*)    Hemoglobin 10.8 (*)    HCT 33.2 (*)    All other components within normal limits  RESP PANEL BY RT-PCR (RSV, FLU A&B, COVID)  RVPGX2  LACTIC ACID, PLASMA  URINALYSIS, ROUTINE W REFLEX MICROSCOPIC  LACTIC ACID, PLASMA                                                                                                                          Radiology DG Shoulder Left Result Date: 09/20/2023 CLINICAL DATA:  63 year old female status post fall 1 week ago with continued pain. Lightheadedness. EXAM: LEFT SHOULDER - 2+ VIEW COMPARISON:  Portable chest 07/06/2023. FINDINGS: Three views. Bone mineralization is within normal limits. There is no evidence of fracture or dislocation. No glenohumeral joint dislocation. Proximal left humerus intact. Mild for age left [facility] joint degeneration. Negative visible left ribs and chest. IMPRESSION: No acute fracture or dislocation identified about the left shoulder. Electronically Signed   By: VEAR Hurst M.D.   On: 09/20/2023 05:37   CT CERVICAL SPINE WO CONTRAST Result Date: 09/20/2023 CLINICAL DATA:  62 year old female status post fall 1 week ago with continued pain. Lightheadedness. EXAM: CT CERVICAL SPINE WITHOUT CONTRAST TECHNIQUE: Multidetector CT imaging of the cervical spine was performed without intravenous contrast. Multiplanar CT image reconstructions were also generated. RADIATION DOSE REDUCTION: This exam was performed according to the departmental dose-optimization program which includes automated exposure control, adjustment of the mA and/or kV according to patient size and/or use of iterative reconstruction technique. COMPARISON:  Face CT 10/22/2015. Head CT today. FINDINGS: Alignment: Mild straightening and reversal of the normal lower cervical lordosis. Cervicothoracic junction alignment is within normal limits. Maintained posterior element alignment. Skull base and vertebrae: Evidence of prior  suboccipital decompression in addition to left suboccipital craniectomy detailed separately today. Otherwise skull base appears intact. Unhealed fracture of the left anterior C1 ring, although with partially sclerotic margins (series 7, images 20, 21, series 9, image 16. In 2017 C1 was intact. However, evidence of this C1 deformity on prior head CTs earlier this year. Furthermore, chronic appearing C2 spinous process fracture, un healed. This was not visible in 2017. Otherwise chronically degenerated C1 and C2 vertebrae appear intact and aligned. Chronic postoperative changes elsewhere in the cervical spine detailed below. No acute osseous abnormality identified. Soft tissues and spinal canal: No prevertebral fluid or swelling. No visible canal hematoma. Postoperative changes to the paraspinal soft tissues. Larynx and pharynx motion artifact. Partially retropharyngeal course of the right carotid. Disc levels: Suboccipital decompression plus widespread prior cervical ACDF. C4-C5 solid arthrodesis. C5-C6 postoperative sequelae although  no convincing arthrodesis. C6-C7 ACDF hardware in place. No evidence of hardware loosening. But scant if any arthrodesis at that level. Adjacent segment C3-C4 and C7-T1 advanced disc, endplate, and facet degeneration. Advanced chronic facet degeneration at C2-C3. Upper chest: Negative visible noncontrast thoracic inlet. IMPRESSION: 1. Chronic appearing anterior C1 ring and C2 spinous process fractures. Underlying chronic suboccipital decompression, and multilevel cervical ACDF. 2. No convincing acute traumatic injury identified in the cervical spine. 3. ACDF with solid arthrodesis at C4-C5, but evidence of pseudoarthrosis at C5-C6, and scant if any arthrodesis at C6-C7. 4. Adjacent segment disease at C3-C4 and C7-T1, advanced facet arthropathy at C2-C3. Electronically Signed   By: VEAR Hurst M.D.   On: 09/20/2023 05:36   CT Head Wo Contrast Result Date: 09/20/2023 CLINICAL DATA:   63 year old female status post fall 1 week ago with continued pain. Lightheadedness. EXAM: CT HEAD WITHOUT CONTRAST TECHNIQUE: Contiguous axial images were obtained from the base of the skull through the vertex without intravenous contrast. RADIATION DOSE REDUCTION: This exam was performed according to the departmental dose-optimization program which includes automated exposure control, adjustment of the mA and/or kV according to patient size and/or use of iterative reconstruction technique. COMPARISON:  Head CT 07/06/2023. FINDINGS: Brain: Stable chronic encephalomalacia left posterolateral cerebellum underlying craniectomy. No posterior fossa mass effect. Hyperdense embolic material in the left cerebellopontine angle appears stable. Underlying brain volume within normal limits for age. No midline shift, ventriculomegaly, mass effect, evidence of mass lesion, intracranial hemorrhage or evidence of cortically based acute infarction. Supratentorial gray-white differentiation is normal. Vascular: Left cerebellopontine angle hyperdense embolic material. No suspicious intracranial vascular hyperdensity. Skull: Chronic left suboccipital craniectomy, stable. No acute osseous abnormality identified. Sinuses/Orbits: Right maxillary sinus mucoperiosteal thickening, minor additional scattered paranasal sinus mucosal thickening. No sinus fluid levels identified. Tympanic cavities and mastoids remain well aerated. Other: No acute orbit or scalp soft tissue finding. No recent scalp soft tissue injury identified. IMPRESSION: 1. No acute intracranial abnormality or recent traumatic injury identified. 2. Stable sequelae of left suboccipital craniectomy with hyperdense embolic material in the left cerebellopontine angle. Electronically Signed   By: VEAR Hurst M.D.   On: 09/20/2023 05:30    Pertinent labs & imaging results that were available during my care of the patient were reviewed by me and considered in my medical decision  making (see MDM for details).  Medications Ordered in ED Medications  cloNIDine  (CATAPRES ) tablet 0.2 mg (0.2 mg Oral Given 09/20/23 0526)  ondansetron  (ZOFRAN ) injection 4 mg (4 mg Intravenous Given 09/20/23 0527)  labetalol  (NORMODYNE ) injection 5 mg (5 mg Intravenous Given 09/20/23 0631)  morphine  (PF) 4 MG/ML injection 4 mg (4 mg Intravenous Given 09/20/23 0529)  iohexol  (OMNIPAQUE ) 300 MG/ML solution 80 mL (80 mLs Intravenous Contrast Given 09/20/23 0643)  Procedures Procedures  (including critical care time)  Medical Decision Making / ED Course   This patient presents to the ED for concern of abdominal pain, shoulder pain, neck pain, this involves an extensive number of treatment options, and is a complaint that carries with it a high risk of complications and morbidity.  The differential diagnosis includes fracture, hematoma, contusion, ligamentous injury, upper GI bleed, peptic ulcer disease, malignancy  MDM: Patient seen emerged part for evaluation of multiple complaints described above.  Physical exam with tenderness at the left shoulder, over the trapezius on the left and in the midline C-spine as well as in the epigastrium.  Laboratory evaluation with no significant leukocytosis, hemoglobin 10.8 which is down trended from 2 months ago at 13.3, albumin is 2.9, lactic acid is normal, COVID, flu, RSV negative.  Trauma imaging including CT head, C-spine and x-ray shoulder is reassuringly negative for acute traumatic injury.  Patient is hypertensive on arrival and I restarted her home clonidine  as well as IV labetalol .  At time of signout, patient pending CT abdomen pelvis.  Please see provider signout for continuation of workup.   Additional history obtained:  -External records from outside source obtained and reviewed including: Chart review including previous  notes, labs, imaging, consultation notes   Lab Tests: -I ordered, reviewed, and interpreted labs.   The pertinent results include:   Labs Reviewed  COMPREHENSIVE METABOLIC PANEL WITH GFR - Abnormal; Notable for the following components:      Result Value   Total Protein 5.5 (*)    Albumin 2.9 (*)    AST 12 (*)    All other components within normal limits  CBC WITH DIFFERENTIAL/PLATELET - Abnormal; Notable for the following components:   RBC 3.62 (*)    Hemoglobin 10.8 (*)    HCT 33.2 (*)    All other components within normal limits  RESP PANEL BY RT-PCR (RSV, FLU A&B, COVID)  RVPGX2  LACTIC ACID, PLASMA  URINALYSIS, ROUTINE W REFLEX MICROSCOPIC  LACTIC ACID, PLASMA     Imaging Studies ordered: I ordered imaging studies including CT head, C-spine, x-ray shoulder I independently visualized and interpreted imaging. I agree with the radiologist interpretation  CTAP pending   Medicines ordered and prescription drug management: Meds ordered this encounter  Medications   DISCONTD: cloNIDine  (CATAPRES ) tablet 0.2 mg   DISCONTD: labetalol  (NORMODYNE ) tablet 200 mg   DISCONTD: morphine  (MS CONTIN ) 12 hr tablet 15 mg    Refill:  0   ondansetron  (ZOFRAN ) injection 4 mg   labetalol  (NORMODYNE ) injection 5 mg   morphine  (PF) 4 MG/ML injection 4 mg    Refill:  0   cloNIDine  (CATAPRES ) tablet 0.2 mg   iohexol  (OMNIPAQUE ) 300 MG/ML solution 80 mL    -I have reviewed the patients home medicines and have made adjustments as needed  Critical interventions none   Cardiac Monitoring: The patient was maintained on a cardiac monitor.  I personally viewed and interpreted the cardiac monitored which showed an underlying rhythm of: NSR  Social Determinants of Health:  Factors impacting patients care include: none   Reevaluation: After the interventions noted above, I reevaluated the patient and found that they have :improved  Co morbidities that complicate the patient  evaluation  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   Benign tumor of adrenal gland    Benign tumor of adrenal gland    Fibromyalgia  GERD (gastroesophageal reflux disease)    Hiatal hernia    Hyperlipidemia    Hypertension    IBS (irritable bowel syndrome)    Migraines    S/P Nissen fundoplication (without gastrostomy tube) procedure    Sciatica    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  Please see provider signout for continuation of workup.     Final Clinical Impression(s) / ED Diagnoses Final diagnoses:  None     @PCDICTATION @    Albertina Dixon, MD 09/20/23 [phone]

## 2023-09-26 DIAGNOSIS — Z79899 Other long term (current) drug therapy: Secondary | ICD-10-CM | POA: Insufficient documentation

## 2023-09-26 DIAGNOSIS — Z7982 Long term (current) use of aspirin: Secondary | ICD-10-CM | POA: Insufficient documentation

## 2023-09-26 DIAGNOSIS — W19XXXA Unspecified fall, initial encounter: Secondary | ICD-10-CM | POA: Insufficient documentation

## 2023-09-26 DIAGNOSIS — M542 Cervicalgia: Secondary | ICD-10-CM | POA: Diagnosis present

## 2023-09-26 DIAGNOSIS — I10 Essential (primary) hypertension: Secondary | ICD-10-CM | POA: Insufficient documentation

## 2023-09-26 DIAGNOSIS — M5412 Radiculopathy, cervical region: Secondary | ICD-10-CM | POA: Diagnosis not present

## 2023-09-27 ENCOUNTER — Emergency Department (HOSPITAL_COMMUNITY)

## 2023-09-27 ENCOUNTER — Other Ambulatory Visit: Payer: Self-pay

## 2023-09-27 ENCOUNTER — Emergency Department (HOSPITAL_COMMUNITY)
Admission: EM | Admit: 2023-09-27 | Discharge: 2023-09-27 | Disposition: A | Discharge status: Home / Self Care | Attending: Emergency Medicine | Admitting: Emergency Medicine

## 2023-09-27 ENCOUNTER — Encounter (HOSPITAL_COMMUNITY): Payer: Self-pay | Admitting: Emergency Medicine

## 2023-09-27 DIAGNOSIS — M5412 Radiculopathy, cervical region: Secondary | ICD-10-CM | POA: Diagnosis not present

## 2023-09-27 DIAGNOSIS — I10 Essential (primary) hypertension: Secondary | ICD-10-CM

## 2023-09-27 LAB — CBC WITH DIFFERENTIAL/PLATELET
Abs Immature Granulocytes: 0.05 K/uL (ref 0.00–0.07)
Basophils Absolute: 0.1 K/uL (ref 0.0–0.1)
Basophils Relative: 1 %
Eosinophils Absolute: 0.3 K/uL (ref 0.0–0.5)
Eosinophils Relative: 2 %
HCT: 37.6 % (ref 36.0–46.0)
Hemoglobin: 12.2 g/dL (ref 12.0–15.0)
Immature Granulocytes: 0 %
Lymphocytes Relative: 17 %
Lymphs Abs: 2.3 K/uL (ref 0.7–4.0)
MCH: 29.5 pg (ref 26.0–34.0)
MCHC: 32.4 g/dL (ref 30.0–36.0)
MCV: 91 fL (ref 80.0–100.0)
Monocytes Absolute: 0.9 K/uL (ref 0.1–1.0)
Monocytes Relative: 7 %
Neutro Abs: 10 K/uL — ABNORMAL HIGH (ref 1.7–7.7)
Neutrophils Relative %: 73 %
Platelets: 412 K/uL — ABNORMAL HIGH (ref 150–400)
RBC: 4.13 MIL/uL (ref 3.87–5.11)
RDW: 14.1 % (ref 11.5–15.5)
WBC: 13.7 K/uL — ABNORMAL HIGH (ref 4.0–10.5)
nRBC: 0 % (ref 0.0–0.2)

## 2023-09-27 LAB — BASIC METABOLIC PANEL WITH GFR
Anion gap: 14 (ref 5–15)
BUN: 17 mg/dL (ref 8–23)
CO2: 26 mmol/L (ref 22–32)
Calcium: 10.1 mg/dL (ref 8.9–10.3)
Chloride: 103 mmol/L (ref 98–111)
Creatinine, Ser: 0.83 mg/dL (ref 0.44–1.00)
GFR, Estimated: >60 mL/min (ref >60)
Glucose, Bld: 95 mg/dL (ref 70–99)
Potassium: 3.5 mmol/L (ref 3.5–5.1)
Sodium: 143 mmol/L (ref 135–145)

## 2023-09-27 MED ORDER — ONDANSETRON HCL 4 MG/2ML IJ SOLN
4.0000 mg | Freq: Once | INTRAMUSCULAR | Status: AC
  Administered 2023-09-27: 4 mg via INTRAVENOUS
  Filled 2023-09-27: qty 2

## 2023-09-27 MED ORDER — HYDROMORPHONE HCL 1 MG/ML IJ SOLN
0.5000 mg | Freq: Once | INTRAMUSCULAR | Status: AC
  Administered 2023-09-27: 0.5 mg via INTRAVENOUS
  Filled 2023-09-27: qty 0.5

## 2023-09-27 MED ORDER — SODIUM CHLORIDE 0.9 % IV BOLUS
1000.0000 mL | Freq: Once | INTRAVENOUS | Status: AC
  Administered 2023-09-27: 1000 mL via INTRAVENOUS

## 2023-09-27 NOTE — ED Triage Notes (Signed)
 Pt here for re-evaluation of L neck and arm pain.

## 2023-09-27 NOTE — ED Provider Notes (Signed)
 [place] EMERGENCY DEPARTMENT AT [facility] Provider Note   CSN: [id number] Arrival date & time: 09/26/23  2357     Patient presents with: Neck and arm pain   Renee Gutierrez is a 63 y.o. female.   The history is provided by the patient.  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 Patient reports she was seen earlier this month after a fall.  She had extensive imaging and was discharged, but since that time she has had increasing pain in the shoulder and neck.  No new falls.  She reports it hurts to move her left arm but it is not weak.  No fevers or vomiting.  No new headaches.  No new visual changes as she is already blind in the left eye.  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 Authorizing Provider  amLODipine  (NORVASC ) 10 MG tablet Take by mouth. 04/09/20   [provider]  aspirin EC 81 MG tablet Take 81 mg by mouth daily.  09/21/13   [provider]  aspirin-acetaminophen -caffeine (EXCEDRIN MIGRAINE) 250-250-65 MG tablet Take 2 tablets by mouth daily as needed. For pain 05/24/14   [provider]  atorvastatin (LIPITOR) 20 MG tablet Take 1 tablet by mouth daily.    [provider]  cloNIDine  (CATAPRES ) 0.2 MG  tablet Take 0.2 mg by mouth 2 (two) times daily.    [provider]  diclofenac Sodium (VOLTAREN) 1 % GEL Apply 2 g topically 4 (four) times daily. 04/18/20   [provider]  DULoxetine (CYMBALTA) 60 MG capsule Take by mouth. 05/16/20   [provider]  erythromycin  ophthalmic ointment PLACE INTO THE LEFT EYE 3  TIMES DAILY Patient not taking: Reported on 05/14/2023 04/16/18   [provider]  ferrous sulfate 325 (65 FE) MG tablet Take 1 tablet by mouth daily.    [provider]  Fluticasone Furoate-Vilanterol (BREO ELLIPTA) 100-25 MCG/INH AEPB Inhale 1 puff into the lungs daily. Patient not taking: Reported on 05/14/2023 04/25/14   [provider]  ipratropium-albuterol (DUONEB) 0.5-2.5 (3) MG/3ML SOLN Take 3 mLs by nebulization 4 (four) times daily. Patient not taking: Reported on 05/14/2023    [provider]  labetalol  (NORMODYNE ) 200 MG tablet Take 200 mg by mouth 2 (two) times daily.    [provider]  lisinopril  (PRINIVIL ,ZESTRIL ) 40 MG tablet Take 40 mg by mouth daily. 01/31/14 05/14/23  [provider]  mirtazapine  (REMERON ) 15 MG tablet Take 1 tablet (15 mg total) by mouth at bedtime as needed (insomnia). 05/16/23   Bryn Bernardino NOVAK, MD  morphine  (MS CONTIN ) 15 MG 12 hr tablet Take 15 mg by mouth every 12 (  twelve) hours.    [provider]  morphine  (MSIR) 15 MG tablet Take by mouth. 05/08/20   [provider]  mupirocin ointment (BACTROBAN) 2 % Apply topically. 03/25/16   [provider]  naloxone [facility]) nasal spray 4 mg/0.1 mL Place into the nose. 09/17/18   [provider]  ondansetron  (ZOFRAN ) 4 MG tablet Take 4 mg by mouth every 8 (eight) hours as needed.    [provider]  orphenadrine (NORFLEX) 100 MG tablet Take by mouth. 10/06/19   [provider]  oxyCODONE  10 MG TABS Take 1 tablet (10 mg total) by mouth every 4 (four) hours as needed for severe pain (pain  score 7-10). 06/13/23   Raford Lenis, MD  pantoprazole  (PROTONIX ) 40 MG tablet Take 1 tablet (40 mg total) by mouth daily. 09/20/23   Simon Lavonia SAILOR, MD  Potassium Chloride  ER 20 MEQ TBCR Take one tablet twice a day for ten days, then take one tablet every day 06/13/23   Glick, David, MD  pravastatin (PRAVACHOL) 40 MG tablet Take 40 mg by mouth at bedtime. 11/28/14 05/14/23  [provider]  prednisoLONE  acetate (PRED FORTE ) 1 % ophthalmic suspension 1 drop 4 (four) times daily. 03/01/20   [provider]  promethazine  (PHENERGAN ) 12.5 MG tablet Take 12.5 mg by mouth every 12 (twelve) hours as needed for nausea.    [provider]  rOPINIRole  (REQUIP ) 2 MG tablet Take 2 mg by mouth 2 (two) times daily. 11/11/14   [provider]  simethicone (GAS RELIEF EXTRA STRENGTH) 125 MG chewable tablet Chew 125 mg by mouth daily as needed for flatulence.  05/24/14   [provider]  Vitamin D, Ergocalciferol, (DRISDOL) 1.25 MG (50000 UNIT) CAPS capsule Take 50,000 Units by mouth once a week. 05/10/23   [provider]    Allergies: Buprenorphine hcl, Bupropion, Codeine, Sulfamethoxazole-trimethoprim, Nsaids, Pregabalin, Sulfa antibiotics, Tapentadol, Clindamycin, Clindamycin/lincomycin, Cymbalta [duloxetine hcl], Diclofenac, Diclofenac sodium, Gabapentin, Oxycodone , and Toradol  [ketorolac  tromethamine ]    Review of Systems  Musculoskeletal:  Positive for arthralgias and neck pain.    Updated Vital Signs BP (!) 217/107   Pulse 72   Temp 98.5 F (36.9 C) (Oral)   Resp 16   Ht 1.499 m (4' 11)   Wt 45 kg   SpO2 98%   BMI 20.04 kg/m   Physical Exam CONSTITUTIONAL: Well developed/well nourished HEAD: Normocephalic/atraumatic ENMT: Mucous membranes moist NECK: supple no meningeal signs SPINE/BACK: Diffuse cervical spinal paraspinal tenderness No thoracic tenderness No bruising/crepitance/stepoffs noted to spine CV: S1/S2 noted, no murmurs/rubs/gallops  noted LUNGS: Lungs are clear to auscultation bilaterally, no apparent distress ABDOMEN: soft, nontender NEURO: Pt is awake/alert/appropriate, moves all extremitiesx4.  No facial droop.   Equal power (5/5) with hand grip, wrist flex/extension, elbow flex/extension, and equal power with shoulder abduction/adduction.  No focal sensory deficit to light touch is noted in either UE.   EXTREMITIES: pulses normal/equal, full ROM Tenderness noted to the left shoulder but no deformities.  She can fully range the left shoulder Distal pulses equal and intact in both upper extremities SKIN: warm, color normal PSYCH: Anxious  (all labs ordered are listed, but only abnormal results are displayed) Labs Reviewed  CBC WITH DIFFERENTIAL/PLATELET - Abnormal; Notable for the following components:      Result Value   WBC 13.7 (*)    Platelets 412 (*)    Neutro Abs 10.0 (*)    All other components within normal limits  BASIC METABOLIC PANEL WITH  GFR    EKG: EKG Interpretation Date/Time:  Saturday September 27 2023 01:30:40 EDT Ventricular Rate:  73 PR Interval:  122 QRS Duration:  90 QT Interval:  396 QTC Calculation: 437 R Axis:   68  Text Interpretation: Sinus rhythm Anteroseptal infarct, old Interpretation limited secondary to artifact No significant change since last tracing Confirmed by Midge Golas ([id number]) on 09/27/2023 1:37:27 AM  Radiology: CT Cervical Spine Wo Contrast Result Date: 09/27/2023 CLINICAL DATA:  History of left neck and arm pain following fall 2 weeks ago, subsequent encounter EXAM: CT CERVICAL SPINE WITHOUT CONTRAST TECHNIQUE: Multidetector CT imaging of the cervical spine was performed without intravenous contrast. Multiplanar CT image reconstructions were also generated. RADIATION DOSE REDUCTION: This exam was performed according to the departmental dose-optimization program which includes automated exposure control, adjustment of the mA and/or kV according to patient size and/or  use of iterative reconstruction technique. COMPARISON:  09/20/2023 FINDINGS: Alignment: Slight loss of the normal cervical lordosis. Skull base and vertebrae: 7 cervical segments are well visualized. There is again identified chronic fracture of the anterior ring of C1 on the left. The margins are well corticated consistent with a chronic etiology. Postsurgical changes in the posterior C1 ring are noted. Chronic C2 spinous process fracture is again identified again with sclerotic margins. Changes of prior fusion are noted from C4 to C7. Persistent anterior fixation at C6-7 is noted. Osteophytic change and facet hypertrophic changes are noted. No acute fracture or acute facet abnormality is noted. Postsurgical changes are noted in the left occipital bone laterally consistent with known AVM resection. Soft tissues and spinal canal: Surrounding soft tissue structures are within normal limits. Upper chest: Visualized lung apices are unremarkable. Other: None IMPRESSION: Postsurgical and degenerative changes of the cervical spine stable in appearance from the prior exam. Chronic C1 fracture stable from the prior exam. No acute fracture is seen. Electronically Signed   By: Oneil Devonshire M.D.   On: 09/27/2023 02:06     Procedures   Medications Ordered in the ED  HYDROmorphone  (DILAUDID ) injection 0.5 mg (0.5 mg Intravenous Given 09/27/23 0129)  ondansetron  (ZOFRAN ) injection 4 mg (4 mg Intravenous Given 09/27/23 0129)  sodium chloride  0.9 % bolus 1,000 mL (0 mLs Intravenous Stopped 09/27/23 0241)  HYDROmorphone  (DILAUDID ) injection 0.5 mg (0.5 mg Intravenous Given 09/27/23 0237)    Clinical Course as of 09/27/23 0319  Sat Sep 27, 2023  0139 Patient presents for continued pain in her shoulder and left neck after recent fall.  She has not had any new falls but reports the pain is worsening.  Patient had previous C-spine surgery and did have abnormal findings on recent CT C-spine.  Will repeat imaging, will also check  labs and treat pain Per narcotic database patient is on Dilaudid  chronically [DW]  [phone] Patient presents for continued neck pain and left shoulder pain.  She has had pain earlier in the month.  No new falls.  She denies any active chest pain or shortness of breath.  The pain is clearly reproducible on exam, but she has no focal neurodeficits CT imaging is unchanged Patient now reports she is out of her Dilaudid  at home until Monday.  Advised she will need to follow-up with us  as an outpatient.  Discussed need to follow-up for outpatient with neurology for possible cervical radiculopathy.  Reports she does not like going to [place] due to traffic and she lives in [place], but was given information for specialist in [place] and she can follow-up  with who is convenient [DW]  0318 Of note, patient does have persistently high hypertension.  However this could be due to underlying pain.  She denied any chest pain, back pain or any weakness. She reports she does take her meds consistently.  However she has been known to have resistant hypertension and reports she has had this for a while low suspicion for hypertensive emergency at this time [DW]    Clinical Course User Index [DW] Midge Golas, MD                                 Medical Decision Making Amount and/or Complexity of Data Reviewed Labs: ordered. Radiology: ordered. ECG/medicine tests: ordered.  Risk Prescription drug management.   This patient presents to the ED for concern of neck pain, this involves an extensive number of treatment options, and is a complaint that carries with it a high risk of complications and morbidity.  The differential diagnosis includes but is not limited to cervical spine fracture, muscle strain, carotid dissection, meningitis, epidural abscess, discitis, cervical radiculopathy  Comorbidities that complicate the patient evaluation: Patient's presentation is complicated by their history of previous  AVM, multiple drug allergies  Social Determinants of Health: Patient's chronic pain  increases the complexity of managing their presentation  Additional history obtained: Records reviewed outpatient records reviewed  Lab Tests: I Ordered, and personally interpreted labs.  The pertinent results include: Mild leukocytosis  Imaging Studies ordered: I ordered imaging studies including CT scan cervical spine  I independently visualized and interpreted imaging which showed no acute findings, chronic findings noted I agree with the radiologist interpretation  Medicines ordered and prescription drug management: I ordered medication including Dilaudid  for pain Reevaluation of the patient after these medicines showed that the patient    stayed the same  Reevaluation: After the interventions noted above, I reevaluated the patient and found that they have :improved  Complexity of problems addressed: Patient's presentation is most consistent with  acute presentation with potential threat to life or bodily function  Disposition: After consideration of the diagnostic results and the patient's response to treatment,  I feel that the patent would benefit from discharge  .        Final diagnoses:  Cervical radiculopathy  Primary hypertension    ED Discharge Orders     None          Midge Golas, MD 09/27/23 [phone]

## 2023-10-08 ENCOUNTER — Ambulatory Visit (INDEPENDENT_AMBULATORY_CARE_PROVIDER_SITE_OTHER): Admitting: Gastroenterology

## 2023-10-21 ENCOUNTER — Encounter (HOSPITAL_COMMUNITY): Payer: Self-pay

## 2023-10-21 ENCOUNTER — Other Ambulatory Visit: Payer: Self-pay

## 2023-10-21 ENCOUNTER — Emergency Department (HOSPITAL_COMMUNITY): Admission: EM | Admit: 2023-10-21 | Discharge: 2023-10-21 | Discharge status: Against Medical Advice

## 2023-10-21 DIAGNOSIS — I10 Essential (primary) hypertension: Secondary | ICD-10-CM | POA: Insufficient documentation

## 2023-10-21 DIAGNOSIS — Z5321 Procedure and treatment not carried out due to patient leaving prior to being seen by health care provider: Secondary | ICD-10-CM | POA: Diagnosis not present

## 2023-10-21 LAB — COMPREHENSIVE METABOLIC PANEL WITH GFR
ALT: 9 U/L (ref 0–44)
AST: 16 U/L (ref 15–41)
Albumin: 3 g/dL — ABNORMAL LOW (ref 3.5–5.0)
Alkaline Phosphatase: 74 U/L (ref 38–126)
Anion gap: 12 (ref 5–15)
BUN: 13 mg/dL (ref 8–23)
CO2: 30 mmol/L (ref 22–32)
Calcium: 8.9 mg/dL (ref 8.9–10.3)
Chloride: 100 mmol/L (ref 98–111)
Creatinine, Ser: 0.62 mg/dL (ref 0.44–1.00)
GFR, Estimated: >60 mL/min (ref >60)
Glucose, Bld: 106 mg/dL — ABNORMAL HIGH (ref 70–99)
Potassium: 2.6 mmol/L — CL (ref 3.5–5.1)
Sodium: 142 mmol/L (ref 135–145)
Total Bilirubin: 0.4 mg/dL (ref 0.0–1.2)
Total Protein: 6.2 g/dL — ABNORMAL LOW (ref 6.5–8.1)

## 2023-10-21 LAB — CBC WITH DIFFERENTIAL/PLATELET
Abs Immature Granulocytes: 0.07 K/uL (ref 0.00–0.07)
Basophils Absolute: 0.1 K/uL (ref 0.0–0.1)
Basophils Relative: 1 %
Eosinophils Absolute: 0.2 K/uL (ref 0.0–0.5)
Eosinophils Relative: 1 %
HCT: 38.2 % (ref 36.0–46.0)
Hemoglobin: 12.2 g/dL (ref 12.0–15.0)
Immature Granulocytes: 1 %
Lymphocytes Relative: 24 %
Lymphs Abs: 2.9 K/uL (ref 0.7–4.0)
MCH: 29.1 pg (ref 26.0–34.0)
MCHC: 31.9 g/dL (ref 30.0–36.0)
MCV: 91.2 fL (ref 80.0–100.0)
Monocytes Absolute: 0.8 K/uL (ref 0.1–1.0)
Monocytes Relative: 7 %
Neutro Abs: 7.9 K/uL — ABNORMAL HIGH (ref 1.7–7.7)
Neutrophils Relative %: 66 %
Platelets: 418 K/uL — ABNORMAL HIGH (ref 150–400)
RBC: 4.19 MIL/uL (ref 3.87–5.11)
RDW: 14.2 % (ref 11.5–15.5)
WBC: 11.9 K/uL — ABNORMAL HIGH (ref 4.0–10.5)
nRBC: 0 % (ref 0.0–0.2)

## 2023-10-21 NOTE — ED Triage Notes (Signed)
 Pt arrived via POV from home c/o hypertension for past 2 days and been feeling sick, laying in her bed. Pt endorses N/V and reports she has been unable to keep her medications down.

## 2023-10-21 NOTE — ED Notes (Addendum)
 While this RN was Triaging another Pt, the Pt and her significant other walked into the Triage room and reported she was not going to wait any longer. This RN then witnessed the Pt walk out of the facility.

## 2023-10-21 NOTE — ED Notes (Signed)
 Pt presenting restless in the wheelchair. Pt verbally reporting she is in pain and needs to lay down.

## 2023-10-21 NOTE — ED Notes (Signed)
 Called and left pt a message stating she has some abnormal lab work and to call me back.

## 2023-10-22 ENCOUNTER — Ambulatory Visit (HOSPITAL_COMMUNITY): Payer: Self-pay

## 2023-10-23 ENCOUNTER — Other Ambulatory Visit: Payer: Self-pay

## 2023-10-23 ENCOUNTER — Emergency Department (HOSPITAL_COMMUNITY)
Admission: EM | Admit: 2023-10-23 | Discharge: 2023-10-23 | Disposition: A | Discharge status: Home / Self Care | Attending: Emergency Medicine | Admitting: Emergency Medicine

## 2023-10-23 DIAGNOSIS — R52 Pain, unspecified: Secondary | ICD-10-CM | POA: Insufficient documentation

## 2023-10-23 DIAGNOSIS — Z7982 Long term (current) use of aspirin: Secondary | ICD-10-CM | POA: Insufficient documentation

## 2023-10-23 DIAGNOSIS — Z79899 Other long term (current) drug therapy: Secondary | ICD-10-CM | POA: Insufficient documentation

## 2023-10-23 DIAGNOSIS — E876 Hypokalemia: Secondary | ICD-10-CM | POA: Insufficient documentation

## 2023-10-23 LAB — CBC WITH DIFFERENTIAL/PLATELET
Abs Immature Granulocytes: 0.06 K/uL (ref 0.00–0.07)
Basophils Absolute: 0.1 K/uL (ref 0.0–0.1)
Basophils Relative: 1 %
Eosinophils Absolute: 0.4 K/uL (ref 0.0–0.5)
Eosinophils Relative: 3 %
HCT: 37.2 % (ref 36.0–46.0)
Hemoglobin: 11.7 g/dL — ABNORMAL LOW (ref 12.0–15.0)
Immature Granulocytes: 1 %
Lymphocytes Relative: 21 %
Lymphs Abs: 2.7 K/uL (ref 0.7–4.0)
MCH: 28.9 pg (ref 26.0–34.0)
MCHC: 31.5 g/dL (ref 30.0–36.0)
MCV: 91.9 fL (ref 80.0–100.0)
Monocytes Absolute: 0.9 K/uL (ref 0.1–1.0)
Monocytes Relative: 7 %
Neutro Abs: 8.7 K/uL — ABNORMAL HIGH (ref 1.7–7.7)
Neutrophils Relative %: 67 %
Platelets: 401 K/uL — ABNORMAL HIGH (ref 150–400)
RBC: 4.05 MIL/uL (ref 3.87–5.11)
RDW: 13.9 % (ref 11.5–15.5)
WBC: 12.8 K/uL — ABNORMAL HIGH (ref 4.0–10.5)
nRBC: 0 % (ref 0.0–0.2)

## 2023-10-23 LAB — BASIC METABOLIC PANEL WITH GFR
Anion gap: 13 (ref 5–15)
BUN: 21 mg/dL (ref 8–23)
CO2: 26 mmol/L (ref 22–32)
Calcium: 9.4 mg/dL (ref 8.9–10.3)
Chloride: 102 mmol/L (ref 98–111)
Creatinine, Ser: 0.83 mg/dL (ref 0.44–1.00)
GFR, Estimated: >60 mL/min (ref >60)
Glucose, Bld: 92 mg/dL (ref 70–99)
Potassium: 2.9 mmol/L — ABNORMAL LOW (ref 3.5–5.1)
Sodium: 141 mmol/L (ref 135–145)

## 2023-10-23 MED ORDER — HYDROMORPHONE HCL 1 MG/ML IJ SOLN
1.0000 mg | Freq: Once | INTRAMUSCULAR | Status: AC
  Administered 2023-10-23: 1 mg via INTRAVENOUS
  Filled 2023-10-23: qty 1

## 2023-10-23 MED ORDER — POTASSIUM CHLORIDE IN NACL 20-0.9 MEQ/L-% IV SOLN
Freq: Once | INTRAVENOUS | Status: AC
  Filled 2023-10-23: qty 1000

## 2023-10-23 MED ORDER — METOCLOPRAMIDE HCL 5 MG/ML IJ SOLN
10.0000 mg | Freq: Once | INTRAMUSCULAR | Status: AC
  Administered 2023-10-23: 10 mg via INTRAVENOUS
  Filled 2023-10-23: qty 2

## 2023-10-23 MED ORDER — DEXAMETHASONE SODIUM PHOSPHATE 4 MG/ML IJ SOLN
4.0000 mg | Freq: Once | INTRAMUSCULAR | Status: AC
  Administered 2023-10-23: 4 mg via INTRAVENOUS
  Filled 2023-10-23: qty 1

## 2023-10-23 NOTE — ED Provider Notes (Signed)
 [place] EMERGENCY DEPARTMENT AT [facility] Provider Note   CSN: [id number] Arrival date & time: 10/23/23  1206     Patient presents with: No chief complaint on file.   Renee Gutierrez is a 63 y.o. female.   HPI Patient presents 1 day after leaving prior to completing evaluation now with concern for ongoing pain.  Patient notes that in spite of taking oral Dilaudid  she continues to have pain throughout her left side, as well as ongoing right facial asymmetry. She is here with her husband who assists with the history. Patient states that she started vomiting with this pain, she denies any explicit suicidal ideation or plan.  Worsening pain she presented yesterday for evaluation, left prior to complete evaluation. Today, patient was notified of abnormal lab results, and returns for additional evaluation. Report of the patient's potassium level from tests last night was 2.7.     Prior to Admission medications   Medication Sig Start Date End Date Taking? Authorizing Provider  amLODipine  (NORVASC ) 10 MG tablet Take by mouth. 04/09/20   [provider]  aspirin EC 81 MG tablet Take 81 mg by mouth daily.  09/21/13   [provider]  aspirin-acetaminophen -caffeine (EXCEDRIN MIGRAINE) 250-250-65 MG tablet Take 2 tablets by mouth daily as needed. For pain 05/24/14   [provider]  atorvastatin (LIPITOR) 20 MG tablet Take 1 tablet by mouth daily.    [provider]  cloNIDine  (CATAPRES ) 0.2 MG tablet Take 0.4 mg by mouth 2 (two) times daily.    [provider]  diclofenac Sodium (VOLTAREN) 1 % GEL Apply 2 g topically 4 (four) times daily. 04/18/20   [provider]  DULoxetine (CYMBALTA) 60 MG capsule Take 60 mg by mouth 2 (two) times daily. 05/16/20   [provider]  erythromycin  ophthalmic ointment PLACE INTO THE LEFT EYE 3  TIMES DAILY Patient not taking: Reported on 05/14/2023 04/16/18   [provider]   ferrous sulfate 325 (65 FE) MG tablet Take 1 tablet by mouth daily.    [provider]  Fluticasone Furoate-Vilanterol (BREO ELLIPTA) 100-25 MCG/INH AEPB Inhale 1 puff into the lungs daily. Patient not taking: Reported on 05/14/2023 04/25/14   [provider]  HYDROmorphone  (DILAUDID ) 4 MG tablet Take 4 mg by mouth every 8 (eight) hours as needed for moderate pain (pain score 4-6).  11/12/23  [provider]  ipratropium-albuterol (DUONEB) 0.5-2.5 (3) MG/3ML SOLN Take 3 mLs by nebulization 4 (four) times daily. Patient not taking: Reported on 05/14/2023    [provider]  labetalol  (NORMODYNE ) 200 MG tablet Take 200 mg by mouth 2 (two) times daily.    [provider]  lisinopril  (PRINIVIL ,ZESTRIL ) 40 MG tablet Take 40 mg by mouth daily. 01/31/14 05/14/23  [provider]  mirtazapine  (REMERON ) 15 MG tablet Take 1 tablet (15 mg total) by mouth at bedtime as needed (insomnia). 05/16/23   Bryn Bernardino NOVAK, MD  morphine  (MS CONTIN ) 15 MG 12 hr tablet Take 15 mg by mouth every 12 (twelve) hours.    [provider]  morphine  (MSIR) 15 MG tablet Take 15 mg by mouth every 4 (four) hours as needed for severe pain (pain score 7-10). 05/08/20   [provider]  mupirocin ointment (BACTROBAN) 2 % Apply 1 Application topically 3 (three) times daily. 03/25/16   [provider]  naloxone [facility]) nasal spray 4 mg/0.1 mL Place 1 spray into the nose once. 09/17/18   [provider]  ondansetron  (ZOFRAN ) 4 MG tablet Take 4 mg by mouth every 8 (eight) hours as needed for nausea or vomiting.    [provider]  orphenadrine (NORFLEX) 100 MG tablet Take 100 mg by mouth at bedtime. 10/06/19   [provider]  oxyCODONE  10 MG TABS Take 1 tablet (10 mg total) by mouth every 4 (four) hours as needed for severe pain (pain score 7-10). 06/13/23   Raford Lenis, MD  pantoprazole  (PROTONIX ) 40 MG tablet Take 1 tablet (40 mg total) by  mouth daily. 09/20/23   Simon Lavonia SAILOR, MD  Potassium Chloride  ER 20 MEQ TBCR Take one tablet twice a day for ten days, then take one tablet every day 06/13/23   Glick, David, MD  pravastatin (PRAVACHOL) 40 MG tablet Take 40 mg by mouth at bedtime. 11/28/14 05/14/23  [provider]  prednisoLONE  acetate (PRED FORTE ) 1 % ophthalmic suspension 1 drop 4 (four) times daily. 03/01/20   [provider]  promethazine  (PHENERGAN ) 12.5 MG tablet Take 12.5 mg by mouth every 12 (twelve) hours as needed for nausea.    [provider]  rOPINIRole  (REQUIP ) 2 MG tablet Take 2 mg by mouth 2 (two) times daily. 2 mg am, 4 mg pm 11/11/14   [provider]  simethicone (GAS RELIEF EXTRA STRENGTH) 125 MG chewable tablet Chew 125 mg by mouth daily as needed for flatulence.  05/24/14   [provider]  Vitamin D, Ergocalciferol, (DRISDOL) 1.25 MG (50000 UNIT) CAPS capsule Take 50,000 Units by mouth once a week. 05/10/23   [provider]    Allergies: Buprenorphine hcl, Bupropion, Codeine, Sulfamethoxazole-trimethoprim, Nsaids, Pregabalin, Sulfa antibiotics, Tapentadol, Clindamycin, Clindamycin/lincomycin, Cymbalta [duloxetine hcl], Diclofenac, Diclofenac sodium, Gabapentin, Oxycodone , and Toradol  [ketorolac  tromethamine ]    Review of Systems  Updated Vital Signs BP (!) 156/74   Pulse 91   Temp 98.4 F (36.9 C) (Oral)   Resp 11   SpO2 92%   Physical Exam Vitals and nursing note reviewed.  Constitutional:      General: She is not in acute distress.    Appearance: She is well-developed.  HENT:     Head: Normocephalic and atraumatic.  Eyes:     Conjunctiva/sclera: Conjunctivae normal.  Cardiovascular:     Rate and Rhythm: Normal rate and regular rhythm.  Pulmonary:     Effort: Pulmonary effort is normal. No respiratory distress.     Breath sounds: Normal breath sounds. No stridor.  Abdominal:     General: There is no distension.  Skin:    General: Skin is  warm and dry.  Neurological:     Mental Status: She is alert and oriented to person, place, and time.     Cranial Nerves: No cranial nerve deficit.     Comments: Right-sided facial droop.  Advanced for age atrophy throughout, the patient follows all commands appropriately, moves her extremities spontaneously as well.  Psychiatric:        Mood and Affect: Mood normal.     (all labs ordered are listed, but only abnormal results are displayed) Labs Reviewed  BASIC METABOLIC PANEL WITH GFR - Abnormal; Notable for the following components:      Result Value   Potassium 2.9 (*)    All other components within normal limits  CBC WITH DIFFERENTIAL/PLATELET - Abnormal; Notable for the following components:   WBC 12.8 (*)    Hemoglobin 11.7 (*)    Platelets 401 (*)    Neutro Abs 8.7 (*)  All other components within normal limits    EKG: EKG Interpretation Date/Time:  Thursday October 23 2023 12:21:43 EDT Ventricular Rate:  83 PR Interval:  147 QRS Duration:  82 QT Interval:  341 QTC Calculation: 401 R Axis:   11  Text Interpretation: Sinus rhythm Confirmed by Garrick Charleston [phone]) on 10/23/2023 12:52:58 PM  Radiology: No results found.   Procedures   Medications Ordered in the ED  0.9 % NaCl with KCl 20 mEq/ L  infusion ( Intravenous New Bag/Given 10/23/23 1323)  HYDROmorphone  (DILAUDID ) injection 1 mg (1 mg Intravenous Given 10/23/23 1321)  metoCLOPramide  (REGLAN ) injection 10 mg (10 mg Intravenous Given 10/23/23 1319)  dexamethasone  (DECADRON ) injection 4 mg (4 mg Intravenous Given 10/23/23 1320)                                    Medical Decision Making Medical issues including trigeminal neuralgia, chronic weakness and pain who presents with weakness and pain and prior labs notable for hypokalemia. Concern for renal dysfunction, hypertensive urgency, electrolyte abnormalities, with no notable change from baseline neurologic condition less suspicion for stroke.  Cardiac 85  sinus normal pulse ox 98% room air normal  Amount and/or Complexity of Data Reviewed Independent Historian: spouse External Data Reviewed: notes.    Details: 7ED  Visits in the past 6 months Labs: ordered. Decision-making details documented in ED Course. ECG/medicine tests: ordered and independent interpretation performed. Decision-making details documented in ED Course.  Risk Prescription drug management. Decision regarding hospitalization. Diagnosis or treatment significantly limited by social determinants of health.   2:28 PM Patient improved on repeat exam, no neurocomplaints, has received potassium repletion.  Concern for acute on chronic symptoms, though she has mild leukocytosis, mild hypokalemia, no evidence for acute new phenomena, patient discharged in stable condition.     Final diagnoses:  Pain  Hypokalemia    ED Discharge Orders     None          Garrick Charleston, MD 10/23/23 1428

## 2023-10-23 NOTE — Discharge Instructions (Signed)
 As discussed, your evaluation today has been largely reassuring.  But, it is important that you monitor your condition carefully, and do not hesitate to return to the ED if you develop new, or concerning changes in your condition. ? ?Otherwise, please follow-up with your physician for appropriate ongoing care. ? ?

## 2023-10-23 NOTE — ED Notes (Signed)
 See triage notes. Pt tearful and states has been depressed for years due to her trigeminal neuralgia.  States she is hurting and doesn't want to be on this earth anymore. Pt denies wanting to hurt/kill self or have a plan. Edp aware.  Pt c/o left arm hurting x 2 weeks now. A/o. Moving all extremities.

## 2023-10-23 NOTE — ED Triage Notes (Signed)
 Pt states she was called about her low potassium and is back to receive Tx. Pt does have a mouth droop and speech is off as well as L arm /shoulder numbess, pt states this has been present for a couple of weeks

## 2023-10-23 NOTE — ED Notes (Signed)
 Pt states her balance has been off for a couple of weeks too EDP made aware

## 2023-10-23 NOTE — ED Notes (Signed)
 EDP at bedside

## 2023-10-23 NOTE — ED Notes (Signed)
 Pt returned call pertaining to lab work, potassium level, EDP either recommended eating foods high in potassium content, such as bananas, or follow up with PCP--However, pt states she does not have a PCP she states. EDP also stated she could also come back here and we could treat the low potassium.

## 2023-10-27 ENCOUNTER — Emergency Department (HOSPITAL_COMMUNITY)

## 2023-10-27 ENCOUNTER — Emergency Department (HOSPITAL_COMMUNITY)
Admission: EM | Admit: 2023-10-27 | Discharge: 2023-10-27 | Discharge status: Against Medical Advice | Attending: Emergency Medicine | Admitting: Emergency Medicine

## 2023-10-27 ENCOUNTER — Encounter (HOSPITAL_COMMUNITY): Payer: Self-pay

## 2023-10-27 ENCOUNTER — Other Ambulatory Visit: Payer: Self-pay

## 2023-10-27 DIAGNOSIS — M25512 Pain in left shoulder: Secondary | ICD-10-CM | POA: Insufficient documentation

## 2023-10-27 DIAGNOSIS — Z5321 Procedure and treatment not carried out due to patient leaving prior to being seen by health care provider: Secondary | ICD-10-CM | POA: Insufficient documentation

## 2023-10-27 DIAGNOSIS — W19XXXA Unspecified fall, initial encounter: Secondary | ICD-10-CM | POA: Insufficient documentation

## 2023-10-27 DIAGNOSIS — Y92009 Unspecified place in unspecified non-institutional (private) residence as the place of occurrence of the external cause: Secondary | ICD-10-CM | POA: Diagnosis not present

## 2023-10-27 NOTE — ED Notes (Signed)
 Pt not in waiting room or outside x1

## 2023-10-27 NOTE — ED Triage Notes (Signed)
 Pt arrived via POV c/o left shoulder pain from a fall at home 2 weeks ago. Pt reports she cannot get the pain under control.

## 2023-10-27 NOTE — ED Notes (Signed)
Not in waiting area x 2 

## 2023-11-01 ENCOUNTER — Other Ambulatory Visit: Payer: Self-pay

## 2023-11-01 ENCOUNTER — Observation Stay (HOSPITAL_COMMUNITY)
Admission: EM | Admit: 2023-11-01 | Discharge: 2023-11-03 | Disposition: A | Discharge status: Home / Self Care | Attending: Family Medicine | Admitting: Family Medicine

## 2023-11-01 ENCOUNTER — Encounter (HOSPITAL_COMMUNITY): Payer: Self-pay

## 2023-11-01 DIAGNOSIS — M25512 Pain in left shoulder: Secondary | ICD-10-CM

## 2023-11-01 DIAGNOSIS — Z7982 Long term (current) use of aspirin: Secondary | ICD-10-CM | POA: Diagnosis not present

## 2023-11-01 DIAGNOSIS — K219 Gastro-esophageal reflux disease without esophagitis: Secondary | ICD-10-CM | POA: Diagnosis present

## 2023-11-01 DIAGNOSIS — G500 Trigeminal neuralgia: Secondary | ICD-10-CM | POA: Diagnosis present

## 2023-11-01 DIAGNOSIS — D649 Anemia, unspecified: Secondary | ICD-10-CM

## 2023-11-01 DIAGNOSIS — I10 Essential (primary) hypertension: Secondary | ICD-10-CM | POA: Insufficient documentation

## 2023-11-01 DIAGNOSIS — I1A0 Resistant hypertension: Secondary | ICD-10-CM | POA: Diagnosis present

## 2023-11-01 DIAGNOSIS — Z79899 Other long term (current) drug therapy: Secondary | ICD-10-CM | POA: Diagnosis not present

## 2023-11-01 DIAGNOSIS — E876 Hypokalemia: Principal | ICD-10-CM | POA: Diagnosis present

## 2023-11-01 DIAGNOSIS — D509 Iron deficiency anemia, unspecified: Secondary | ICD-10-CM | POA: Diagnosis not present

## 2023-11-01 DIAGNOSIS — E785 Hyperlipidemia, unspecified: Secondary | ICD-10-CM | POA: Diagnosis not present

## 2023-11-01 DIAGNOSIS — G894 Chronic pain syndrome: Secondary | ICD-10-CM | POA: Diagnosis present

## 2023-11-01 DIAGNOSIS — E875 Hyperkalemia: Secondary | ICD-10-CM | POA: Diagnosis present

## 2023-11-01 DIAGNOSIS — F4542 Pain disorder with related psychological factors: Secondary | ICD-10-CM | POA: Diagnosis present

## 2023-11-01 LAB — COMPREHENSIVE METABOLIC PANEL WITH GFR
ALT: 8 U/L (ref 0–44)
AST: 13 U/L — ABNORMAL LOW (ref 15–41)
Albumin: 2.6 g/dL — ABNORMAL LOW (ref 3.5–5.0)
Alkaline Phosphatase: 79 U/L (ref 38–126)
Anion gap: 13 (ref 5–15)
BUN: 12 mg/dL (ref 8–23)
CO2: 21 mmol/L — ABNORMAL LOW (ref 22–32)
Calcium: 9 mg/dL (ref 8.9–10.3)
Chloride: 106 mmol/L (ref 98–111)
Creatinine, Ser: 0.84 mg/dL (ref 0.44–1.00)
GFR, Estimated: >60 mL/min (ref >60)
Glucose, Bld: 106 mg/dL — ABNORMAL HIGH (ref 70–99)
Potassium: 2.3 mmol/L — CL (ref 3.5–5.1)
Sodium: 140 mmol/L (ref 135–145)
Total Bilirubin: 0.3 mg/dL (ref 0.0–1.2)
Total Protein: 6.1 g/dL — ABNORMAL LOW (ref 6.5–8.1)

## 2023-11-01 LAB — CBC WITH DIFFERENTIAL/PLATELET
Abs Immature Granulocytes: 0.12 K/uL — ABNORMAL HIGH (ref 0.00–0.07)
Basophils Absolute: 0.1 K/uL (ref 0.0–0.1)
Basophils Relative: 1 %
Eosinophils Absolute: 0.2 K/uL (ref 0.0–0.5)
Eosinophils Relative: 1 %
HCT: 30.9 % — ABNORMAL LOW (ref 36.0–46.0)
Hemoglobin: 9.9 g/dL — ABNORMAL LOW (ref 12.0–15.0)
Immature Granulocytes: 1 %
Lymphocytes Relative: 18 %
Lymphs Abs: 2.4 K/uL (ref 0.7–4.0)
MCH: 28.7 pg (ref 26.0–34.0)
MCHC: 32 g/dL (ref 30.0–36.0)
MCV: 89.6 fL (ref 80.0–100.0)
Monocytes Absolute: 1.1 K/uL — ABNORMAL HIGH (ref 0.1–1.0)
Monocytes Relative: 9 %
Neutro Abs: 9.2 K/uL — ABNORMAL HIGH (ref 1.7–7.7)
Neutrophils Relative %: 70 %
Platelets: 547 K/uL — ABNORMAL HIGH (ref 150–400)
RBC: 3.45 MIL/uL — ABNORMAL LOW (ref 3.87–5.11)
RDW: 14 % (ref 11.5–15.5)
WBC: 13.2 K/uL — ABNORMAL HIGH (ref 4.0–10.5)
nRBC: 0 % (ref 0.0–0.2)

## 2023-11-01 LAB — POC OCCULT BLOOD, ED: Occult Blood, Feces: NEGATIVE — NL

## 2023-11-01 MED ORDER — POTASSIUM CHLORIDE 10 MEQ/100ML IV SOLN
10.0000 meq | INTRAVENOUS | Status: AC
  Administered 2023-11-02 (×4): 10 meq via INTRAVENOUS
  Filled 2023-11-01 (×3): qty 100

## 2023-11-01 MED ORDER — HYDROMORPHONE HCL 1 MG/ML IJ SOLN
1.0000 mg | Freq: Once | INTRAMUSCULAR | Status: AC
  Administered 2023-11-01: 1 mg via INTRAVENOUS
  Filled 2023-11-01: qty 1

## 2023-11-01 MED ORDER — ONDANSETRON HCL 4 MG/2ML IJ SOLN
4.0000 mg | Freq: Once | INTRAMUSCULAR | Status: AC
  Administered 2023-11-01: 4 mg via INTRAVENOUS
  Filled 2023-11-01: qty 2

## 2023-11-01 MED ORDER — PANTOPRAZOLE SODIUM 40 MG IV SOLR
40.0000 mg | Freq: Once | INTRAVENOUS | Status: AC
  Administered 2023-11-02: 40 mg via INTRAVENOUS
  Filled 2023-11-01: qty 10

## 2023-11-01 MED ORDER — DEXAMETHASONE SODIUM PHOSPHATE 10 MG/ML IJ SOLN
10.0000 mg | Freq: Once | INTRAMUSCULAR | Status: AC
  Administered 2023-11-01: 10 mg via INTRAVENOUS
  Filled 2023-11-01: qty 1

## 2023-11-01 NOTE — ED Provider Notes (Signed)
 [place] EMERGENCY DEPARTMENT AT [facility] Provider Note   CSN: [id number] Arrival date & time: 11/01/23  2208     Patient presents with: Trigeminal Neuralgia and Shoulder Pain   Renee Gutierrez is a 63 y.o. 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  She was seen here the day of that fall and had completed imaging including the left shoulder with no obvious orthopedic injuries.  However she continues to have severe pain in the shoulder.  She has arranged an orthopedic follow-up but this is not until the last week of September.  She is under the care of pain management through [facility] and states her pain has not been well-controlled with her OxyContin  with Dilaudid  4 mg tablets for breakthrough pain.  She is awaiting pain management evaluation also later this month in the hopes of medication adjustment.  She states that when her pain gets severe she gets nauseated which also causes her blood pressure to rise.  She does have Zofran  ODT but she cannot stand the taste of this medicine, she feels it worsens her nausea.  She denies abdominal pain, has no fevers or chills, she denies weakness or numbness in her arms.  Her shoulder pain is worse with movement, severe when she attempts to lift it above shoulder height level.  She denies chest pain and shortness of breath.   The history is provided by the patient.       Prior to Admission medications   Medication Sig Start Date End Date Taking? Authorizing Provider  amLODipine  (NORVASC ) 10 MG tablet Take by mouth. 04/09/20   [provider]  aspirin  EC 81 MG tablet Take 81 mg by mouth daily.  09/21/13   [provider]   aspirin -acetaminophen -caffeine  (EXCEDRIN  MIGRAINE) 250-250-65 MG tablet Take 2 tablets by mouth daily as needed. For pain 05/24/14   [provider]  atorvastatin  (LIPITOR) 20 MG tablet Take 1 tablet by mouth daily.    [provider]  cloNIDine  (CATAPRES ) 0.2 MG tablet Take 0.4 mg by mouth 2 (two) times daily.    [provider]  diclofenac Sodium (VOLTAREN) 1 % GEL Apply 2 g topically 4 (four) times daily. 04/18/20   [provider]  DULoxetine  (CYMBALTA ) 60 MG capsule Take 60 mg by mouth 2 (two) times daily. 05/16/20   [provider]  erythromycin  ophthalmic ointment PLACE INTO THE LEFT EYE 3  TIMES DAILY Patient not taking: Reported on 05/14/2023 04/16/18   [provider]  ferrous sulfate  325 (65 FE) MG tablet Take 1 tablet by mouth daily.    [provider]  Fluticasone Furoate-Vilanterol (BREO ELLIPTA) 100-25 MCG/INH AEPB Inhale 1 puff into the lungs daily. Patient not taking: Reported on 05/14/2023 04/25/14   [provider]  HYDROmorphone  (DILAUDID ) 4 MG tablet Take 4 mg by mouth every 8 (eight) hours as needed for moderate pain (pain score 4-6).  11/12/23  [provider]  ipratropium-albuterol (DUONEB) 0.5-2.5 (3) MG/3ML SOLN Take 3 mLs by nebulization 4 (four) times daily. Patient not taking: Reported on 05/14/2023    [provider]  labetalol  (NORMODYNE ) 200 MG tablet Take 200 mg by mouth 2 (two) times daily.    [provider]  lisinopril  (PRINIVIL ,ZESTRIL ) 40  MG tablet Take 40 mg by mouth daily. 01/31/14 05/14/23  [provider]  mirtazapine  (REMERON ) 15 MG tablet Take 1 tablet (15 mg total) by mouth at bedtime as needed (insomnia). 05/16/23   Bryn Bernardino NOVAK, MD  morphine  (MS CONTIN ) 15 MG 12 hr tablet Take 15 mg by mouth every 12 (twelve) hours.    [provider]  morphine  (MSIR) 15 MG tablet Take 15 mg by mouth every 4 (four) hours as needed for severe pain (pain score 7-10).  05/08/20   [provider]  mupirocin ointment (BACTROBAN) 2 % Apply 1 Application topically 3 (three) times daily. 03/25/16   [provider]  naloxone [facility]) nasal spray 4 mg/0.1 mL Place 1 spray into the nose once. 09/17/18   [provider]  ondansetron  (ZOFRAN ) 4 MG tablet Take 4 mg by mouth every 8 (eight) hours as needed for nausea or vomiting.    [provider]  orphenadrine  (NORFLEX ) 100 MG tablet Take 100 mg by mouth at bedtime. 10/06/19   [provider]  oxyCODONE  10 MG TABS Take 1 tablet (10 mg total) by mouth every 4 (four) hours as needed for severe pain (pain score 7-10). 06/13/23   Raford Lenis, MD  pantoprazole  (PROTONIX ) 40 MG tablet Take 1 tablet (40 mg total) by mouth daily. 09/20/23   Simon Lavonia SAILOR, MD  Potassium Chloride  ER 20 MEQ TBCR Take one tablet twice a day for ten days, then take one tablet every day 06/13/23   Glick, David, MD  pravastatin (PRAVACHOL) 40 MG tablet Take 40 mg by mouth at bedtime. 11/28/14 05/14/23  [provider]  prednisoLONE  acetate (PRED FORTE ) 1 % ophthalmic suspension 1 drop 4 (four) times daily. 03/01/20   [provider]  promethazine  (PHENERGAN ) 12.5 MG tablet Take 12.5 mg by mouth every 12 (twelve) hours as needed for nausea.    [provider]  rOPINIRole  (REQUIP ) 2 MG tablet Take 2 mg by mouth 2 (two) times daily. 2 mg am, 4 mg pm 11/11/14   [provider]  simethicone (GAS RELIEF EXTRA STRENGTH) 125 MG chewable tablet Chew 125 mg by mouth daily as needed for flatulence.  05/24/14   [provider]  Vitamin D , Ergocalciferol , (DRISDOL ) 1.25 MG (50000 UNIT) CAPS capsule Take 50,000 Units by mouth once a week. 05/10/23   [provider]    Allergies: Buprenorphine hcl, Bupropion, Codeine, Sulfamethoxazole-trimethoprim, Nsaids, Pregabalin , Sulfa antibiotics, Tapentadol, Clindamycin, Clindamycin/lincomycin, Cymbalta  [duloxetine  hcl], Diclofenac, Diclofenac  sodium, Gabapentin, Oxycodone , and Toradol  [ketorolac  tromethamine ]    Review of Systems  Constitutional:  Negative for chills and fever.  Respiratory:  Negative for shortness of breath.   Cardiovascular:  Negative for chest pain.  Gastrointestinal:  Positive for nausea and vomiting. Negative for blood in stool.  Genitourinary: Negative.   Musculoskeletal:  Positive for arthralgias.  Neurological:  Negative for weakness and numbness.    Updated Vital Signs BP (!) 178/96   Pulse 85   Temp 98.8 F (37.1 C) (Oral)   Resp (!) 22   Ht 4' 11 (1.499 m)   Wt 47 kg   SpO2 95%   BMI 20.93 kg/m   Physical Exam Vitals and nursing note reviewed. Exam conducted with a chaperone present.  Constitutional:      Appearance: She is well-developed.  HENT:     Head: Normocephalic and atraumatic.  Eyes:     Conjunctiva/sclera: Conjunctivae normal.  Cardiovascular:     Rate and Rhythm: Normal rate and  regular rhythm.     Heart sounds: Normal heart sounds.  Pulmonary:     Effort: Pulmonary effort is normal.     Breath sounds: Normal breath sounds. No wheezing.  Abdominal:     General: Bowel sounds are normal.     Palpations: Abdomen is soft.     Tenderness: There is no abdominal tenderness.  Genitourinary:    Rectum: Guaiac result negative.  Musculoskeletal:        General: Tenderness present. Normal range of motion.     Left shoulder: Bony tenderness present. No swelling or deformity.     Cervical back: Normal range of motion.     Comments: Ttp anterior left anterior humeral head and distal clavicle.    Skin:    General: Skin is warm and dry.  Neurological:     Mental Status: She is alert.     (all labs ordered are listed, but only abnormal results are displayed) Labs Reviewed  CBC WITH DIFFERENTIAL/PLATELET - Abnormal; Notable for the following components:      Result Value   WBC 13.2 (*)    RBC 3.45 (*)    Hemoglobin 9.9 (*)    HCT 30.9 (*)    Platelets 547 (*)    Neutro  Abs 9.2 (*)    Monocytes Absolute 1.1 (*)    Abs Immature Granulocytes 0.12 (*)    All other components within normal limits  COMPREHENSIVE METABOLIC PANEL WITH GFR - Abnormal; Notable for the following components:   Potassium 2.3 (*)    CO2 21 (*)    Glucose, Bld 106 (*)    Total Protein 6.1 (*)    Albumin 2.6 (*)    AST 13 (*)    All other components within normal limits  POC OCCULT BLOOD, ED - Normal    EKG: None ED ECG REPORT   Date: 11/02/2023  Rate: 86  Rhythm: normal sinus rhythm  QRS Axis: normal  Intervals: normal  ST/T Wave abnormalities: normal  Conduction Disutrbances:none  Narrative Interpretation:   Old EKG Reviewed: unchanged  I have personally reviewed the EKG tracing and agree with the computerized printout as noted.  Radiology: No results found.   Procedures   Medications Ordered in the ED  potassium chloride  10 mEq in 100 mL IVPB (has no administration in time range)  pantoprazole  (PROTONIX ) injection 40 mg (has no administration in time range)  ondansetron  (ZOFRAN ) injection 4 mg (4 mg Intravenous Given 11/01/23 2300)  dexamethasone  (DECADRON ) injection 10 mg (10 mg Intravenous Given 11/01/23 2302)  HYDROmorphone  (DILAUDID ) injection 1 mg (1 mg Intravenous Given 11/01/23 2301)    Clinical Course as of 11/02/23 0004  Sun Nov 02, 2023  0004 EKG 12-Lead [JI]    Clinical Course User Index [JI] Birdena Clarity, PA-C                                 Medical Decision Making Patient presents with chronic pain including left trigeminal neuralgia and left shoulder pain from a new or injury/fall occurring last month.  Imaging from the shoulder injury from her last ED visit was unremarkable, currently awaiting orthopedic follow-up care.  She endorses in addition to severe pain having nausea and vomiting, which she believes is secondary to pain, however she endorses history of acid reflux, states she is taking a PPI currently, she also uses ibuprofen daily.  Labs  have been obtained given this history, she  does have a significant decline in her hemoglobin level, it is 9.9 today, It was 11.7 9 days ago.  Hemoccult here is negative.  She also has a significant hypokalemia of 2.3.   IV potassium has been ordered, when discussing her hemoglobin level, she states she woke 4 nights ago with nausea and vomited a handful of blood, denies any further hematemesis.  Amount and/or Complexity of Data Reviewed Labs: ordered.    Details: Per above potassium 2.3, down from 2.9 9 days ago. ECG/medicine tests:  Decision-making details documented in ED Course. Discussion of management or test interpretation with external provider(s): Pt discussed with Dr. Arrien who accepts pt for admission.  Risk Decision regarding hospitalization.        Final diagnoses:  Hypokalemia  Anemia, unspecified type  Acute pain of left shoulder  Trigeminal neuralgia of left side of face    ED Discharge Orders     None          Birdena Mliss RIGGERS 11/02/23 0006    Suzette Pac, MD 11/03/23 1159

## 2023-11-01 NOTE — ED Provider Notes (Incomplete)
 [place] EMERGENCY DEPARTMENT AT [facility] Provider Note   CSN: [id number] Arrival date & time: 11/01/23  2208     Patient presents with: Trigeminal Neuralgia and Shoulder Pain   Renee Gutierrez is a 63 y.o. 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  She was seen here the day of that fall and had completed imaging including the left shoulder with no obvious orthopedic injuries.  However she continues to have severe pain in the shoulder.  She has arranged an orthopedic follow-up but this is not until the last week of September.  She is under the care of pain management through [facility] and states her pain has not been well-controlled with her OxyContin  with Dilaudid  4 mg tablets for breakthrough pain.  She is awaiting pain management evaluation also later this month in the hopes of medication adjustment.  She states that when her pain gets severe she gets nauseated which also causes her blood pressure to rise.  She does have Zofran  ODT but she cannot stand the taste of this medicine, she feels it worsens her nausea.  She denies abdominal pain, has no fevers or chills, she denies weakness or numbness in her arms.  Her shoulder pain is worse with movement, severe when she attempts to lift it above shoulder height level.  She denies chest pain and shortness of breath.  {Add pertinent medical, surgical, social history, OB history to YEP:67052} The history is provided by the patient.       Prior to Admission medications   Medication Sig Start Date End Date Taking? Authorizing Provider  amLODipine  (NORVASC ) 10 MG tablet Take by mouth. 04/09/20   [provider]  aspirin  EC 81 MG  tablet Take 81 mg by mouth daily.  09/21/13   [provider]  aspirin -acetaminophen -caffeine  (EXCEDRIN  MIGRAINE) 250-250-65 MG tablet Take 2 tablets by mouth daily as needed. For pain 05/24/14   [provider]  atorvastatin  (LIPITOR) 20 MG tablet Take 1 tablet by mouth daily.    [provider]  cloNIDine  (CATAPRES ) 0.2 MG tablet Take 0.4 mg by mouth 2 (two) times daily.    [provider]  diclofenac Sodium (VOLTAREN) 1 % GEL Apply 2 g topically 4 (four) times daily. 04/18/20   [provider]  DULoxetine  (CYMBALTA ) 60 MG capsule Take 60 mg by mouth 2 (two) times daily. 05/16/20   [provider]  erythromycin  ophthalmic ointment PLACE INTO THE LEFT EYE 3  TIMES DAILY Patient not taking: Reported on 05/14/2023 04/16/18   [provider]  ferrous sulfate  325 (65 FE) MG tablet Take 1 tablet by mouth daily.    [provider]  Fluticasone Furoate-Vilanterol (BREO ELLIPTA) 100-25 MCG/INH AEPB Inhale 1 puff into the lungs daily. Patient not taking: Reported on 05/14/2023 04/25/14   [provider]  HYDROmorphone  (DILAUDID ) 4 MG tablet Take 4 mg by mouth every 8 (eight) hours as needed for moderate pain (pain score 4-6).  11/12/23  [provider]  ipratropium-albuterol (DUONEB) 0.5-2.5 (3) MG/3ML SOLN Take 3 mLs by nebulization 4 (four) times daily. Patient not taking: Reported on 05/14/2023    [provider]  labetalol  (NORMODYNE ) 200 MG tablet Take 200 mg by mouth 2 (two) times daily.  [provider]  lisinopril  (PRINIVIL ,ZESTRIL ) 40 MG tablet Take 40 mg by mouth daily. 01/31/14 05/14/23  [provider]  mirtazapine  (REMERON ) 15 MG tablet Take 1 tablet (15 mg total) by mouth at bedtime as needed (insomnia). 05/16/23   Bryn Bernardino NOVAK, MD  morphine  (MS CONTIN ) 15 MG 12 hr tablet Take 15 mg by mouth every 12 (twelve) hours.    [provider]  morphine  (MSIR) 15 MG tablet Take 15 mg by  mouth every 4 (four) hours as needed for severe pain (pain score 7-10). 05/08/20   [provider]  mupirocin ointment (BACTROBAN) 2 % Apply 1 Application topically 3 (three) times daily. 03/25/16   [provider]  naloxone [facility]) nasal spray 4 mg/0.1 mL Place 1 spray into the nose once. 09/17/18   [provider]  ondansetron  (ZOFRAN ) 4 MG tablet Take 4 mg by mouth every 8 (eight) hours as needed for nausea or vomiting.    [provider]  orphenadrine  (NORFLEX ) 100 MG tablet Take 100 mg by mouth at bedtime. 10/06/19   [provider]  oxyCODONE  10 MG TABS Take 1 tablet (10 mg total) by mouth every 4 (four) hours as needed for severe pain (pain score 7-10). 06/13/23   Raford Lenis, MD  pantoprazole  (PROTONIX ) 40 MG tablet Take 1 tablet (40 mg total) by mouth daily. 09/20/23   Simon Lavonia SAILOR, MD  Potassium Chloride  ER 20 MEQ TBCR Take one tablet twice a day for ten days, then take one tablet every day 06/13/23   Glick, David, MD  pravastatin (PRAVACHOL) 40 MG tablet Take 40 mg by mouth at bedtime. 11/28/14 05/14/23  [provider]  prednisoLONE  acetate (PRED FORTE ) 1 % ophthalmic suspension 1 drop 4 (four) times daily. 03/01/20   [provider]  promethazine  (PHENERGAN ) 12.5 MG tablet Take 12.5 mg by mouth every 12 (twelve) hours as needed for nausea.    [provider]  rOPINIRole  (REQUIP ) 2 MG tablet Take 2 mg by mouth 2 (two) times daily. 2 mg am, 4 mg pm 11/11/14   [provider]  simethicone (GAS RELIEF EXTRA STRENGTH) 125 MG chewable tablet Chew 125 mg by mouth daily as needed for flatulence.  05/24/14   [provider]  Vitamin D , Ergocalciferol , (DRISDOL ) 1.25 MG (50000 UNIT) CAPS capsule Take 50,000 Units by mouth once a week. 05/10/23   [provider]    Allergies: Buprenorphine hcl, Bupropion, Codeine, Sulfamethoxazole-trimethoprim, Nsaids, Pregabalin , Sulfa antibiotics, Tapentadol, Clindamycin,  Clindamycin/lincomycin, Cymbalta  [duloxetine  hcl], Diclofenac, Diclofenac sodium, Gabapentin, Oxycodone , and Toradol  [ketorolac  tromethamine ]    Review of Systems  Constitutional:  Negative for chills and fever.  Respiratory:  Negative for shortness of breath.   Cardiovascular:  Negative for chest pain.  Gastrointestinal:  Positive for nausea and vomiting. Negative for blood in stool.  Genitourinary: Negative.   Musculoskeletal:  Positive for arthralgias.  Neurological:  Negative for weakness and numbness.    Updated Vital Signs BP (!) 178/96   Pulse 85   Temp 98.8 F (37.1 C) (Oral)   Resp (!) 22   Ht 4' 11 (1.499 m)   Wt 47 kg   SpO2 95%   BMI 20.93 kg/m   Physical Exam Vitals and nursing note reviewed. Exam conducted with a chaperone present.  Constitutional:      Appearance: She is well-developed.  HENT:     Head: Normocephalic and atraumatic.  Eyes:     Conjunctiva/sclera: Conjunctivae normal.  Cardiovascular:  Rate and Rhythm: Normal rate and regular rhythm.     Heart sounds: Normal heart sounds.  Pulmonary:     Effort: Pulmonary effort is normal.     Breath sounds: Normal breath sounds. No wheezing.  Abdominal:     General: Bowel sounds are normal.     Palpations: Abdomen is soft.     Tenderness: There is no abdominal tenderness.  Genitourinary:    Rectum: Guaiac result negative.  Musculoskeletal:        General: Tenderness present. Normal range of motion.     Left shoulder: Bony tenderness present. No swelling or deformity.     Cervical back: Normal range of motion.     Comments: Ttp anterior left anterior humeral head and distal clavicle.    Skin:    General: Skin is warm and dry.  Neurological:     Mental Status: She is alert.     (all labs ordered are listed, but only abnormal results are displayed) Labs Reviewed  CBC WITH DIFFERENTIAL/PLATELET - Abnormal; Notable for the following components:      Result Value   WBC 13.2 (*)    RBC 3.45 (*)     Hemoglobin 9.9 (*)    HCT 30.9 (*)    Platelets 547 (*)    Neutro Abs 9.2 (*)    Monocytes Absolute 1.1 (*)    Abs Immature Granulocytes 0.12 (*)    All other components within normal limits  COMPREHENSIVE METABOLIC PANEL WITH GFR - Abnormal; Notable for the following components:   Potassium 2.3 (*)    CO2 21 (*)    Glucose, Bld 106 (*)    Total Protein 6.1 (*)    Albumin 2.6 (*)    AST 13 (*)    All other components within normal limits  POC OCCULT BLOOD, ED - Normal    EKG: None  Radiology: No results found.  {Document cardiac monitor, telemetry assessment procedure when appropriate:32947} Procedures   Medications Ordered in the ED  potassium chloride  10 mEq in 100 mL IVPB (has no administration in time range)  pantoprazole  (PROTONIX ) injection 40 mg (has no administration in time range)  ondansetron  (ZOFRAN ) injection 4 mg (4 mg Intravenous Given 11/01/23 2300)  dexamethasone  (DECADRON ) injection 10 mg (10 mg Intravenous Given 11/01/23 2302)  HYDROmorphone  (DILAUDID ) injection 1 mg (1 mg Intravenous Given 11/01/23 2301)      {Click here for ABCD2, HEART and other calculators REFRESH Note before signing:1}                              Medical Decision Making Patient presents with chronic pain including left trigeminal neuralgia and left shoulder pain from a new or injury/fall occurring last month.  Imaging from the shoulder injury from her last ED visit was unremarkable, currently awaiting orthopedic follow-up care.  She endorses in addition to severe pain having nausea and vomiting, which she believes is secondary to pain, however she endorses history of acid reflux, states she is taking a PPI currently, she also uses ibuprofen daily.  Labs have been obtained given this history, she does have a significant decline in her hemoglobin level, it is 9.9 today, It was 11.7 9 days ago.  Hemoccult here is negative.  She also has a significant hypokalemia of 2.3.   IV potassium  has been ordered, when discussing her hemoglobin level, she states she woke 4 nights ago with nausea and vomited a handful  of blood, denies any further hematemesis.  Amount and/or Complexity of Data Reviewed Labs: ordered.    Details: Per above potassium 2.3, down from 2.9 9 days ago.  Risk Prescription drug management. Decision regarding hospitalization.     {Document critical care time when appropriate  Document review of labs and clinical decision tools ie CHADS2VASC2, etc  Document your independent review of radiology images and any outside records  Document your discussion with family members, caretakers and with consultants  Document social determinants of health affecting pt's care  Document your decision making why or why not admission, treatments were needed:32947:::1}   Final diagnoses:  Hypokalemia  Anemia, unspecified type  Acute pain of left shoulder  Trigeminal neuralgia of left side of face    ED Discharge Orders     None

## 2023-11-01 NOTE — ED Triage Notes (Signed)
 Pt here due to her trigeminal neuralgia and left shoulder pain that was caused by a fall that she had several weeks ago

## 2023-11-02 DIAGNOSIS — G500 Trigeminal neuralgia: Secondary | ICD-10-CM

## 2023-11-02 DIAGNOSIS — E876 Hypokalemia: Secondary | ICD-10-CM

## 2023-11-02 DIAGNOSIS — K219 Gastro-esophageal reflux disease without esophagitis: Secondary | ICD-10-CM

## 2023-11-02 DIAGNOSIS — I1A0 Resistant hypertension: Secondary | ICD-10-CM | POA: Diagnosis not present

## 2023-11-02 DIAGNOSIS — E785 Hyperlipidemia, unspecified: Secondary | ICD-10-CM

## 2023-11-02 DIAGNOSIS — E875 Hyperkalemia: Secondary | ICD-10-CM | POA: Insufficient documentation

## 2023-11-02 DIAGNOSIS — F4542 Pain disorder with related psychological factors: Secondary | ICD-10-CM

## 2023-11-02 DIAGNOSIS — D649 Anemia, unspecified: Secondary | ICD-10-CM

## 2023-11-02 LAB — BASIC METABOLIC PANEL WITH GFR
Anion gap: 10 (ref 5–15)
BUN: 10 mg/dL (ref 8–23)
CO2: 19 mmol/L — ABNORMAL LOW (ref 22–32)
Calcium: 8.3 mg/dL — ABNORMAL LOW (ref 8.9–10.3)
Chloride: 109 mmol/L (ref 98–111)
Creatinine, Ser: 0.74 mg/dL (ref 0.44–1.00)
GFR, Estimated: >60 mL/min (ref >60)
Glucose, Bld: 159 mg/dL — ABNORMAL HIGH (ref 70–99)
Potassium: 3.5 mmol/L (ref 3.5–5.1)
Sodium: 138 mmol/L (ref 135–145)

## 2023-11-02 LAB — CBC
HCT: 31.1 % — ABNORMAL LOW (ref 36.0–46.0)
Hemoglobin: 10 g/dL — ABNORMAL LOW (ref 12.0–15.0)
MCH: 28.7 pg (ref 26.0–34.0)
MCHC: 32.2 g/dL (ref 30.0–36.0)
MCV: 89.4 fL (ref 80.0–100.0)
Platelets: 505 K/uL — ABNORMAL HIGH (ref 150–400)
RBC: 3.48 MIL/uL — ABNORMAL LOW (ref 3.87–5.11)
RDW: 13.9 % (ref 11.5–15.5)
WBC: 12.3 K/uL — ABNORMAL HIGH (ref 4.0–10.5)
nRBC: 0 % (ref 0.0–0.2)

## 2023-11-02 LAB — FERRITIN: Ferritin: 29 ng/mL (ref 11–307)

## 2023-11-02 LAB — IRON AND TIBC
Iron: 11 ug/dL — ABNORMAL LOW (ref 28–170)
Saturation Ratios: 4 % — ABNORMAL LOW (ref 10.4–31.8)
TIBC: 283 ug/dL (ref 250–450)
UIBC: 272 ug/dL

## 2023-11-02 LAB — MAGNESIUM: Magnesium: 1.8 mg/dL (ref 1.7–2.4)

## 2023-11-02 LAB — TRANSFERRIN: Transferrin: 202 mg/dL (ref 192–382)

## 2023-11-02 MED ORDER — PREDNISOLONE ACETATE 1 % OP SUSP
1.0000 [drp] | Freq: Four times a day (QID) | OPHTHALMIC | Status: DC
  Administered 2023-11-02 – 2023-11-03 (×2): 1 [drp] via OPHTHALMIC
  Filled 2023-11-02: qty 1

## 2023-11-02 MED ORDER — PROMETHAZINE HCL 12.5 MG PO TABS: 12.5000 mg | ORAL_TABLET | Freq: Two times a day (BID) | ORAL | Status: DC | PRN

## 2023-11-02 MED ORDER — ATORVASTATIN CALCIUM 10 MG PO TABS
20.0000 mg | ORAL_TABLET | Freq: Every day | ORAL | Status: DC
Start: 2023-11-02 — End: 2023-11-02
  Administered 2023-11-02: 20 mg via ORAL
  Filled 2023-11-02: qty 2

## 2023-11-02 MED ORDER — ASPIRIN-ACETAMINOPHEN-CAFFEINE 250-250-65 MG PO TABS: 2.0000 | ORAL_TABLET | Freq: Every day | ORAL | Status: DC | PRN

## 2023-11-02 MED ORDER — MORPHINE SULFATE 15 MG PO TABS: 15.0000 mg | ORAL_TABLET | ORAL | Status: DC | PRN

## 2023-11-02 MED ORDER — ROPINIROLE HCL 1 MG PO TABS
2.0000 mg | ORAL_TABLET | Freq: Two times a day (BID) | ORAL | Status: DC
  Administered 2023-11-02 – 2023-11-03 (×4): 2 mg via ORAL
  Filled 2023-11-02 (×4): qty 2

## 2023-11-02 MED ORDER — ASPIRIN 81 MG PO TBEC
81.0000 mg | DELAYED_RELEASE_TABLET | Freq: Every day | ORAL | Status: DC
Start: 2023-11-02 — End: 2023-11-02

## 2023-11-02 MED ORDER — PANTOPRAZOLE SODIUM 40 MG PO TBEC
40.0000 mg | DELAYED_RELEASE_TABLET | Freq: Two times a day (BID) | ORAL | Status: DC
  Administered 2023-11-02 – 2023-11-03 (×4): 40 mg via ORAL
  Filled 2023-11-02 (×4): qty 1

## 2023-11-02 MED ORDER — LABETALOL HCL 200 MG PO TABS
200.0000 mg | ORAL_TABLET | Freq: Two times a day (BID) | ORAL | Status: DC
  Administered 2023-11-02 – 2023-11-03 (×3): 200 mg via ORAL
  Filled 2023-11-02 (×3): qty 1

## 2023-11-02 MED ORDER — ACETAMINOPHEN 650 MG RE SUPP: 650.0000 mg | Freq: Four times a day (QID) | RECTAL | Status: DC | PRN

## 2023-11-02 MED ORDER — HYDROMORPHONE HCL 1 MG/ML IJ SOLN: 1.0000 mg | INTRAMUSCULAR | Status: DC | PRN

## 2023-11-02 MED ORDER — ACETAMINOPHEN-CAFFEINE 500-65 MG PO TABS
1.0000 | ORAL_TABLET | Freq: Once | ORAL | Status: DC
Start: 2023-11-02 — End: 2023-11-02

## 2023-11-02 MED ORDER — ORPHENADRINE CITRATE ER 100 MG PO TB12: 100.0000 mg | ORAL_TABLET | Freq: Every day | ORAL | Status: DC

## 2023-11-02 MED ORDER — INFLUENZA VIRUS VACC SPLIT PF (FLUZONE) 0.5 ML IM SUSY
0.5000 mL | PREFILLED_SYRINGE | INTRAMUSCULAR | Status: DC
  Filled 2023-11-02: qty 0.5

## 2023-11-02 MED ORDER — PREGABALIN 50 MG PO CAPS
50.0000 mg | ORAL_CAPSULE | Freq: Three times a day (TID) | ORAL | Status: DC
  Administered 2023-11-02 – 2023-11-03 (×3): 50 mg via ORAL
  Filled 2023-11-02 (×3): qty 1

## 2023-11-02 MED ORDER — POTASSIUM CHLORIDE CRYS ER 20 MEQ PO TBCR
40.0000 meq | EXTENDED_RELEASE_TABLET | Freq: Once | ORAL | Status: AC
  Administered 2023-11-02: 40 meq via ORAL
  Filled 2023-11-02: qty 2

## 2023-11-02 MED ORDER — HYDROMORPHONE HCL 1 MG/ML IJ SOLN
1.0000 mg | INTRAMUSCULAR | Status: DC | PRN
  Administered 2023-11-02 – 2023-11-03 (×8): 1 mg via INTRAVENOUS
  Filled 2023-11-02 (×8): qty 1

## 2023-11-02 MED ORDER — MAGNESIUM SULFATE 4 GM/100ML IV SOLN
4.0000 g | Freq: Once | INTRAVENOUS | Status: AC
Start: 2023-11-02 — End: 2023-11-02
  Administered 2023-11-02: 4 g via INTRAVENOUS
  Filled 2023-11-02: qty 100

## 2023-11-02 MED ORDER — POTASSIUM CHLORIDE CRYS ER 20 MEQ PO TBCR
40.0000 meq | EXTENDED_RELEASE_TABLET | Freq: Every day | ORAL | Status: DC
  Administered 2023-11-02 – 2023-11-03 (×2): 40 meq via ORAL
  Filled 2023-11-02: qty 2
  Filled 2023-11-02: qty 4

## 2023-11-02 MED ORDER — ONDANSETRON HCL 4 MG PO TABS
4.0000 mg | ORAL_TABLET | Freq: Four times a day (QID) | ORAL | Status: DC | PRN
  Administered 2023-11-02: 4 mg via ORAL
  Filled 2023-11-02: qty 1

## 2023-11-02 MED ORDER — HYDROMORPHONE HCL 2 MG PO TABS: 4.0000 mg | ORAL_TABLET | Freq: Three times a day (TID) | ORAL | Status: DC | PRN

## 2023-11-02 MED ORDER — CLONIDINE HCL 0.2 MG PO TABS
0.2000 mg | ORAL_TABLET | Freq: Two times a day (BID) | ORAL | Status: DC
  Administered 2023-11-02 – 2023-11-03 (×3): 0.2 mg via ORAL
  Filled 2023-11-02 (×3): qty 1

## 2023-11-02 MED ORDER — AMLODIPINE BESYLATE 5 MG PO TABS
10.0000 mg | ORAL_TABLET | Freq: Every day | ORAL | Status: DC
Start: 2023-11-02 — End: 2023-11-02
  Administered 2023-11-02: 10 mg via ORAL
  Filled 2023-11-02: qty 2

## 2023-11-02 MED ORDER — CLONIDINE HCL 0.2 MG PO TABS
0.2000 mg | ORAL_TABLET | ORAL | Status: AC
  Administered 2023-11-02: 0.2 mg via ORAL
  Filled 2023-11-02: qty 1

## 2023-11-02 MED ORDER — DULOXETINE HCL 60 MG PO CPEP
60.0000 mg | ORAL_CAPSULE | Freq: Two times a day (BID) | ORAL | Status: DC
  Administered 2023-11-02 – 2023-11-03 (×4): 60 mg via ORAL
  Filled 2023-11-02: qty 1
  Filled 2023-11-02: qty 2
  Filled 2023-11-02 (×2): qty 1

## 2023-11-02 MED ORDER — FERROUS SULFATE 325 (65 FE) MG PO TABS
325.0000 mg | ORAL_TABLET | Freq: Every day | ORAL | Status: DC
Start: 2023-11-02 — End: 2023-11-03
  Administered 2023-11-02 – 2023-11-03 (×2): 325 mg via ORAL
  Filled 2023-11-02 (×2): qty 1

## 2023-11-02 MED ORDER — ACETAMINOPHEN 325 MG PO TABS
650.0000 mg | ORAL_TABLET | Freq: Four times a day (QID) | ORAL | Status: DC | PRN
  Administered 2023-11-02 (×2): 650 mg via ORAL
  Filled 2023-11-02 (×2): qty 2

## 2023-11-02 MED ORDER — VITAMIN D (ERGOCALCIFEROL) 1.25 MG (50000 UNIT) PO CAPS
50000.0000 [IU] | ORAL_CAPSULE | ORAL | Status: DC
  Administered 2023-11-02: 50000 [IU] via ORAL
  Filled 2023-11-02 (×2): qty 1

## 2023-11-02 MED ORDER — LABETALOL HCL 200 MG PO TABS
200.0000 mg | ORAL_TABLET | ORAL | Status: AC
  Administered 2023-11-02: 200 mg via ORAL
  Filled 2023-11-02: qty 1

## 2023-11-02 MED ORDER — MORPHINE SULFATE ER 15 MG PO TBCR
15.0000 mg | EXTENDED_RELEASE_TABLET | Freq: Two times a day (BID) | ORAL | Status: DC
  Administered 2023-11-02: 15 mg via ORAL
  Filled 2023-11-02: qty 1

## 2023-11-02 MED ORDER — PANTOPRAZOLE SODIUM 40 MG PO TBEC
40.0000 mg | DELAYED_RELEASE_TABLET | Freq: Every day | ORAL | Status: DC
Start: 2023-11-02 — End: 2023-11-02

## 2023-11-02 MED ORDER — ONDANSETRON HCL 4 MG/2ML IJ SOLN: 4.0000 mg | Freq: Four times a day (QID) | INTRAMUSCULAR | Status: DC | PRN

## 2023-11-02 MED ORDER — MIRTAZAPINE 15 MG PO TABS
15.0000 mg | ORAL_TABLET | Freq: Every evening | ORAL | Status: DC | PRN
Start: 2023-11-02 — End: 2023-11-02

## 2023-11-02 MED ORDER — ASPIRIN-ACETAMINOPHEN-CAFFEINE 250-250-65 MG PO TABS
1.0000 | ORAL_TABLET | Freq: Once | ORAL | Status: DC
Start: 2023-11-02 — End: 2023-11-03
  Filled 2023-11-02: qty 1

## 2023-11-02 MED ORDER — LISINOPRIL 10 MG PO TABS
20.0000 mg | ORAL_TABLET | Freq: Every day | ORAL | Status: DC
Start: 2023-11-02 — End: 2023-11-03
  Administered 2023-11-02 – 2023-11-03 (×2): 20 mg via ORAL
  Filled 2023-11-02 (×2): qty 2

## 2023-11-02 NOTE — Assessment & Plan Note (Signed)
 Positive iron deficiency with reactive thrombocytosis.  Continue oral iron supplementation Check iron stores.

## 2023-11-02 NOTE — Assessment & Plan Note (Signed)
 Persistent nausea and vomiting.   Plan to continue K correction with IV and po kcl, will give a total of 80 meq Continue telemetry monitoring until corrected electrolytes  Patient not on diuretic therapy but on chronic Kcl supplementation.

## 2023-11-02 NOTE — Assessment & Plan Note (Addendum)
 Continue pain control with as needed hydromorphone .  Continue mirtazapine , and duloxetine .

## 2023-11-02 NOTE — Assessment & Plan Note (Signed)
 Continue antiacid therapy with pantoprazole .  If persistent coffee ground emesis, may need further work up, with endoscopy.

## 2023-11-02 NOTE — Progress Notes (Signed)
 ASSUMPTION OF CARE NOTE   11/02/2023 3:48 PM  Renee Gutierrez was seen and examined.  The H&P by the admitting provider, orders, imaging was reviewed.  Please see new orders.  Will continue to follow.   Vitals:   11/02/23 0947 11/02/23 1453  BP: (!) 159/85 132/65  Pulse: 90 80  Resp: 20 19  Temp: 98.2 F (36.8 C) 98.1 F (36.7 C)  SpO2: 95% 94%    Results for orders placed or performed during the hospital encounter of 11/01/23  CBC with Differential   Collection Time: 11/01/23 11:05 PM  Result Value Ref Range   WBC 13.2 (H) 4.0 - 10.5 K/uL   RBC 3.45 (L) 3.87 - 5.11 MIL/uL   Hemoglobin 9.9 (L) 12.0 - 15.0 g/dL   HCT 69.0 (L) 63.9 - 53.9 %   MCV 89.6 80.0 - 100.0 fL   MCH 28.7 26.0 - 34.0 pg   MCHC 32.0 30.0 - 36.0 g/dL   RDW 85.9 88.4 - 84.4 %   Platelets 547 (H) 150 - 400 K/uL   nRBC 0.0 0.0 - 0.2 %   Neutrophils Relative % 70 %   Neutro Abs 9.2 (H) 1.7 - 7.7 K/uL   Lymphocytes Relative 18 %   Lymphs Abs 2.4 0.7 - 4.0 K/uL   Monocytes Relative 9 %   Monocytes Absolute 1.1 (H) 0.1 - 1.0 K/uL   Eosinophils Relative 1 %   Eosinophils Absolute 0.2 0.0 - 0.5 K/uL   Basophils Relative 1 %   Basophils Absolute 0.1 0.0 - 0.1 K/uL   Immature Granulocytes 1 %   Abs Immature Granulocytes 0.12 (H) 0.00 - 0.07 K/uL  Comprehensive metabolic panel   Collection Time: 11/01/23 11:05 PM  Result Value Ref Range   Sodium 140 135 - 145 mmol/L   Potassium 2.3 (LL) 3.5 - 5.1 mmol/L   Chloride 106 98 - 111 mmol/L   CO2 21 (L) 22 - 32 mmol/L   Glucose, Bld 106 (H) 70 - 99 mg/dL   BUN 12 8 - 23 mg/dL   Creatinine, Ser 9.15 0.44 - 1.00 mg/dL   Calcium  9.0 8.9 - 10.3 mg/dL   Total Protein 6.1 (L) 6.5 - 8.1 g/dL   Albumin 2.6 (L) 3.5 - 5.0 g/dL   AST 13 (L) 15 - 41 U/L   ALT 8 0 - 44 U/L   Alkaline Phosphatase 79 38 - 126 U/L   Total Bilirubin 0.3 0.0 - 1.2 mg/dL   GFR, Estimated >39 >39 mL/min   Anion gap 13 5 - 15  Iron and TIBC   Collection Time: 11/01/23 11:05 PM  Result  Value Ref Range   Iron 11 (L) 28 - 170 ug/dL   TIBC 716 749 - 549 ug/dL   Saturation Ratios 4 (L) 10.4 - 31.8 %   UIBC 272 ug/dL  Ferritin   Collection Time: 11/01/23 11:05 PM  Result Value Ref Range   Ferritin 29 11 - 307 ng/mL  Transferrin   Collection Time: 11/01/23 11:05 PM  Result Value Ref Range   Transferrin 202 192 - 382 mg/dL  POC occult blood, ED   Collection Time: 11/01/23 11:47 PM  Result Value Ref Range   Occult Blood, Feces Negative   Basic metabolic panel   Collection Time: 11/02/23  4:54 AM  Result Value Ref Range   Sodium 138 135 - 145 mmol/L   Potassium 3.5 3.5 - 5.1 mmol/L   Chloride 109 98 - 111 mmol/L   CO2  19 (L) 22 - 32 mmol/L   Glucose, Bld 159 (H) 70 - 99 mg/dL   BUN 10 8 - 23 mg/dL   Creatinine, Ser 9.25 0.44 - 1.00 mg/dL   Calcium  8.3 (L) 8.9 - 10.3 mg/dL   GFR, Estimated >39 >39 mL/min   Anion gap 10 5 - 15  CBC   Collection Time: 11/02/23  4:54 AM  Result Value Ref Range   WBC 12.3 (H) 4.0 - 10.5 K/uL   RBC 3.48 (L) 3.87 - 5.11 MIL/uL   Hemoglobin 10.0 (L) 12.0 - 15.0 g/dL   HCT 68.8 (L) 63.9 - 53.9 %   MCV 89.4 80.0 - 100.0 fL   MCH 28.7 26.0 - 34.0 pg   MCHC 32.2 30.0 - 36.0 g/dL   RDW 86.0 88.4 - 84.4 %   Platelets 505 (H) 150 - 400 K/uL   nRBC 0.0 0.0 - 0.2 %  Magnesium    Collection Time: 11/02/23  4:54 AM  Result Value Ref Range   Magnesium  1.8 1.7 - 2.4 mg/dL     KYM Louder, MD [facility]   11/01/2023 10:18 PM How to contact the [facility] Attending or Consulting provider 7A - 7P or covering provider during after hours 7P -7A, for this patient?  Check the care team in [facility] and look for a) attending/consulting [facility] provider listed and b) the TR[facility] team listed Log into [email or web address] and use [facility] universal password to access. If you do not have the password, please contact the hospital operator. Locate the [facility] provider you are looking for under [facility] and page to a number that you can be directly reached. If  you still have difficulty reaching the provider, please page the [facility] (Director on Call) for the Hospitalists listed on amion for assistance.

## 2023-11-02 NOTE — Progress Notes (Signed)
   11/02/23 1759  TOC Brief Assessment  Insurance and Status Reviewed  Patient has primary care physician No (Providers in [place] added to AVS)  Home environment has been reviewed From home c/husband  Prior level of function: Independent  Prior/Current Home Services No current home services  Social Drivers of Health Review SDOH reviewed no interventions necessary  Readmission risk has been reviewed Yes  Transition of care needs no transition of care needs at this time   Pt states she is enrolled in HHPT but doesn't remember the agency or who set it up.  TOC/ICM to follow.

## 2023-11-02 NOTE — Care Management Obs Status (Signed)
 MEDICARE OBSERVATION STATUS NOTIFICATION   Patient Details  Name: Renee Gutierrez MRN: [id number] Date of Birth: 08/13/1960   Medicare Observation Status Notification Given:  Yes    [name] Coffee, RN 11/02/2023, 4:49 PM

## 2023-11-02 NOTE — Evaluation (Signed)
 Physical Therapy Evaluation Patient Details Name: Renee Gutierrez MRN: [id number] DOB: 01/07/1961 Today's Date: 11/02/2023  History of Present Illness  Renee Gutierrez is a 63 y.o. female with medical history significant of chronic pain syndrome, anxiety, GERD, hypertension, hyperlipidemia, and trigeminal neuralgia who presented with  persistent left shoulder pain, nausea and vomiting.   She sustained a mechanical fall from her own hight about 3 weeks ago, she was standing by the kitchen sink when her legs gave up and she fell on the floor. Denies any syncope or head trauma. She hit her left shoulder.   09/08 ED visit due to uncontrolled pain., looks like she left prior to being evaluated.   At home she continue to have persistent left shoulder pain, despite taking her oral analgesics, including hydromorphone .   For the last 4 days she has been experiencing nausea and vomiting, coffee ground emesis, along with persistent left shoulder pain.   Decreased po intake, with no diarrhea or frank abdominal pain. No improving or worsening factors.    Because of persistent pain she came back to the hospital.   Clinical Impression  Patient demonstrates modified independence with bed mobility, functional transfers and ambulation. Pt does demonstrate decreased LE strength, abnormal pain rating in left shoulder and neck, and impaired balance. Patient also demonstrates difficulty with ambulation during today's session with decreased stride length and velocity noted although [facility]. Patient also demonstrates holding of head for most of session, headache reported. Patient requires education on role of PT, importance of regulat physical activity and hydration. Patient discharged from acute physical therapy to care of nursing for ambulation daily as tolerated for length of stay and receive remaining needs at next venue of care.          If plan is discharge home, recommend the following: A little help with walking and/or  transfers;A little help with bathing/dressing/bathroom;Assistance with cooking/housework;Assist for transportation;Help with stairs or ramp for entrance   Can travel by private vehicle        Equipment Recommendations None recommended by PT  Recommendations for Other Services       Functional Status Assessment Patient has had a recent decline in their functional status and demonstrates the ability to make significant improvements in function in a reasonable and predictable amount of time.     Precautions / Restrictions Precautions Precautions: Fall Recall of Precautions/Restrictions: Intact Restrictions Weight Bearing Restrictions Per Provider Order: No      Mobility  Bed Mobility Overal bed mobility: Modified Independent                  Transfers Overall transfer level: Modified independent Equipment used: Rolling walker (2 wheels)               General transfer comment: dependence on RW for balance    Ambulation/Gait Ambulation/Gait assistance: Modified independent (Device/Increase time), Supervision Gait Distance (Feet): 15 Feet Assistive device: Rolling walker (2 wheels) Gait Pattern/deviations: Decreased stride length, Shuffle Gait velocity: decreased     General Gait Details: dependent on RW for balance, pt cued for sequencing  Stairs            Wheelchair Mobility     Tilt Bed    Modified Rankin (Stroke Patients Only)       Balance Overall balance assessment: Modified Independent  Pertinent Vitals/Pain Pain Assessment Pain Assessment: Faces Faces Pain Scale: Hurts even more Pain Location: left shoulder and left neck, headache present Pain Descriptors / Indicators: Aching Pain Intervention(s): Limited activity within patient's tolerance, Monitored during session    Home Living Family/patient expects to be discharged to:: Private residence Living Arrangements:  Spouse/significant other Available Help at Discharge: Family;Available PRN/intermittently Type of Home: House Home Access: Stairs to enter Entrance Stairs-Rails: Right Entrance Stairs-Number of Steps: 2   Home Layout: One level Home Equipment: Rollator (4 wheels);BSC/3in1      Prior Function Prior Level of Function : Independent/Modified Independent                     Extremity/Trunk Assessment   Upper Extremity Assessment Upper Extremity Assessment: Generalized weakness;LUE deficits/detail LUE Deficits / Details: decreased shoulder ROM, pt has ortho appointment for workup later this month LUE: Unable to fully assess due to pain    Lower Extremity Assessment Lower Extremity Assessment: Generalized weakness;Overall [facility] for tasks assessed    Cervical / Trunk Assessment Cervical / Trunk Assessment: Kyphotic  Communication   Communication Communication: No apparent difficulties    Cognition Arousal: Alert (tired) Behavior During Therapy: WFL for tasks assessed/performed   PT - Cognitive impairments: No apparent impairments                         Following commands: Intact       Cueing Cueing Techniques: Verbal cues, Visual cues     General Comments      Exercises     Assessment/Plan    PT Assessment All further PT needs can be met in the next venue of care  PT Problem List Decreased strength;Decreased activity tolerance;Decreased balance;Pain       PT Treatment Interventions      PT Goals (Current goals can be found in the Care Plan section)  Acute Rehab PT Goals Patient Stated Goal: to return home PT Goal Formulation: With patient Time For Goal Achievement: 11/03/23 Potential to Achieve Goals: Good    Frequency       Co-evaluation               AM-PAC PT 6 Clicks Mobility  Outcome Measure Help needed turning from your back to your side while in a flat bed without using bedrails?: None Help needed moving from lying on  your back to sitting on the side of a flat bed without using bedrails?: None Help needed moving to and from a bed to a chair (including a wheelchair)?: None Help needed standing up from a chair using your arms (e.g., wheelchair or bedside chair)?: None Help needed to walk in hospital room?: A Little Help needed climbing 3-5 steps with a railing? : A Lot 6 Click Score: 21    End of Session   Activity Tolerance: Patient tolerated treatment well;Patient limited by pain Patient left: in bed Nurse Communication: Mobility status PT Visit Diagnosis: Other abnormalities of gait and mobility (R26.89);Muscle weakness (generalized) (M62.81);Unsteadiness on feet (R26.81);History of falling (Z91.81)    Time: 8879-8860 PT Time Calculation (min) (ACUTE ONLY): 19 min   Charges:   PT Evaluation $PT Eval Low Complexity: 1 Low PT Treatments $Therapeutic Activity: 8-22 mins PT General Charges $$ ACUTE PT VISIT: 1 Visit         Lang Ada, PT, DPT [facility] Office: [phone] 11:52 AM, 11/02/23

## 2023-11-02 NOTE — Plan of Care (Signed)
°  Problem: Education: °Goal: Knowledge of General Education information will improve °Description: Including pain rating scale, medication(s)/side effects and non-pharmacologic comfort measures °Outcome: Progressing °  °Problem: Clinical Measurements: °Goal: Ability to maintain clinical measurements within normal limits will improve °Outcome: Progressing °  °Problem: Clinical Measurements: °Goal: Diagnostic test results will improve °Outcome: Progressing °  °Problem: Coping: °Goal: Level of anxiety will decrease °Outcome: Progressing °  °

## 2023-11-02 NOTE — Assessment & Plan Note (Signed)
 Continue blood pressure control with clonidine , amlodipine , labetalol , lisinopril .  Continue blood pressure monitoring.  If persistent Hypokalemia, to consider adding mineralocorticoid receptor blocker.

## 2023-11-02 NOTE — Assessment & Plan Note (Signed)
 Continue with pravastatin

## 2023-11-02 NOTE — H&P (Signed)
 History and Physical    Patient: Renee Gutierrez FMW:[id number] DOB: 1960/06/19 DOA: 11/01/2023 DOS: the patient was seen and examined on 11/02/2023 PCP: Patient, No Pcp Per  Patient coming from: Home  Chief Complaint:  Chief Complaint  Patient presents with   Trigeminal Neuralgia   Shoulder Pain   HPI: Renee Gutierrez is a 63 y.o. female with medical history significant of chronic pain syndrome, anxiety, GERD, hypertension, hyperlipidemia, and trigeminal neuralgia who presented with  persistent left shoulder pain, nausea and vomiting.  She sustained a mechanical fall from her own hight about 3 weeks ago, she was standing by the kitchen sink when her legs gave up and she fell on the floor. Denies any syncope or head trauma. She hit her left shoulder.  09/08 ED visit due to uncontrolled pain., looks like she left prior to being evaluated.  At home she continue to have persistent left shoulder pain, despite taking her oral analgesics, including hydromorphone .  For the last 4 days she has been experiencing nausea and vomiting, coffee ground emesis, along with persistent left shoulder pain.  Decreased po intake, with no diarrhea or frank abdominal pain. No improving or worsening factors.   Because of persistent pain she came back to the hospital.   Review of Systems: As mentioned in the history of present illness. All other systems reviewed and are negative. 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 Her smoking use included cigarettes. She has never used smokeless tobacco. She reports that she does not drink alcohol and does not use drugs.  Allergies  Allergen Reactions   Buprenorphine Hcl Swelling    All extremeties and neck and face swelled up huge according to patient   Bupropion Swelling and Other (See Comments)    Moody and depressed   Codeine Itching, Nausea And Vomiting, Nausea Only and Swelling   Sulfamethoxazole-Trimethoprim Swelling and Other (See Comments)    Swelled up her eye   Nsaids Other (See Comments)    Unknown    Pregabalin  Swelling   Sulfa Antibiotics Other (See Comments)    Unknown    Tapentadol Nausea And Vomiting   Clindamycin Other (See Comments)    Heart burn   Clindamycin/Lincomycin Rash and Other (See Comments)    Heart burn   Cymbalta  [Duloxetine  Hcl] Swelling   Diclofenac Swelling    Cream    Diclofenac Sodium Swelling    Oral med  Gabapentin Swelling   Oxycodone  Nausea Only   Toradol  [Ketorolac  Tromethamine ] Rash    History reviewed. No pertinent family history.  Prior to Admission medications   Medication Sig Start Date End Date Taking? Authorizing Provider  amLODipine  (NORVASC ) 10 MG tablet Take by mouth. 04/09/20   [provider]  aspirin  EC 81 MG tablet Take 81 mg by mouth daily.  09/21/13   [provider]  aspirin -acetaminophen -caffeine  (EXCEDRIN  MIGRAINE) 250-250-65 MG tablet Take 2 tablets by mouth daily as needed. For pain 05/24/14   [provider]  atorvastatin  (LIPITOR) 20 MG tablet Take 1 tablet by mouth daily.    [provider]  cloNIDine  (CATAPRES ) 0.2 MG tablet Take 0.4  mg by mouth 2 (two) times daily.    [provider]  diclofenac Sodium (VOLTAREN) 1 % GEL Apply 2 g topically 4 (four) times daily. 04/18/20   [provider]  DULoxetine  (CYMBALTA ) 60 MG capsule Take 60 mg by mouth 2 (two) times daily. 05/16/20   [provider]  erythromycin  ophthalmic ointment PLACE INTO THE LEFT EYE 3  TIMES DAILY Patient not taking: Reported on 05/14/2023 04/16/18   [provider]  ferrous sulfate  325 (65 FE) MG tablet Take 1 tablet by mouth daily.    [provider]  Fluticasone Furoate-Vilanterol (BREO ELLIPTA) 100-25 MCG/INH AEPB Inhale 1 puff into the lungs daily. Patient not taking: Reported on 05/14/2023 04/25/14   [provider]  HYDROmorphone  (DILAUDID ) 4 MG tablet Take 4 mg by mouth every 8 (eight) hours as needed for moderate pain (pain score 4-6).  11/12/23  [provider]  ipratropium-albuterol (DUONEB) 0.5-2.5 (3) MG/3ML SOLN Take 3 mLs by nebulization 4 (four) times daily. Patient not taking: Reported on 05/14/2023    [provider]  labetalol  (NORMODYNE ) 200 MG tablet Take 200 mg by mouth 2 (two) times daily.    [provider]  lisinopril  (PRINIVIL ,ZESTRIL ) 40 MG tablet Take 40 mg by mouth daily. 01/31/14 05/14/23  [provider]  mirtazapine  (REMERON ) 15 MG tablet Take 1 tablet (15 mg total) by mouth at bedtime as needed (insomnia). 05/16/23   Bryn Bernardino NOVAK, MD  morphine  (MS CONTIN ) 15 MG 12 hr tablet Take 15 mg by mouth every 12 (twelve) hours.    [provider]  morphine  (MSIR) 15 MG tablet Take 15 mg by mouth every 4 (four) hours as needed for severe pain (pain score 7-10). 05/08/20   [provider]  mupirocin ointment (BACTROBAN) 2 % Apply 1 Application topically 3 (three) times daily. 03/25/16   [provider]  naloxone [facility]) nasal spray 4 mg/0.1 mL Place 1 spray into the nose once. 09/17/18   [provider]  ondansetron  (ZOFRAN ) 4 MG  tablet Take 4 mg by mouth every 8 (eight) hours as needed for nausea or vomiting.    [provider]  orphenadrine  (NORFLEX ) 100 MG tablet Take 100 mg by mouth at bedtime. 10/06/19   [provider]  oxyCODONE  10 MG TABS Take 1 tablet (10 mg total) by mouth every 4 (four) hours as needed for severe pain (pain score 7-10). 06/13/23   Raford Lenis, MD  pantoprazole  (PROTONIX ) 40 MG tablet Take 1 tablet (40 mg total) by mouth daily. 09/20/23   Simon Lavonia SAILOR, MD  Potassium Chloride  ER 20 MEQ TBCR Take one tablet twice a day for ten days, then take one tablet every day 06/13/23   Glick, David, MD  pravastatin (PRAVACHOL) 40 MG tablet Take  40 mg by mouth at bedtime. 11/28/14 05/14/23  [provider]  prednisoLONE  acetate (PRED FORTE ) 1 % ophthalmic suspension 1 drop 4 (four) times daily. 03/01/20   [provider]  promethazine  (PHENERGAN ) 12.5 MG tablet Take 12.5 mg by mouth every 12 (twelve) hours as needed for nausea.    [provider]  rOPINIRole  (REQUIP ) 2 MG tablet Take 2 mg by mouth 2 (two) times daily. 2 mg am, 4 mg pm 11/11/14   [provider]  simethicone (GAS RELIEF EXTRA STRENGTH) 125 MG chewable tablet Chew 125 mg by mouth daily as needed for flatulence.  05/24/14   [provider]  Vitamin D , Ergocalciferol , (DRISDOL ) 1.25 MG (50000 UNIT) CAPS capsule Take 50,000 Units by mouth once a week. 05/10/23   [provider]    Physical Exam: Vitals:   11/01/23 2300 11/01/23 2306 11/01/23 2315 11/01/23 2330  BP:  (!) 188/85 (!) 170/96 (!) 178/96  Pulse: 85 88 75 85  Resp:  (!) 22    Temp:      TempSrc:      SpO2: 99% 97% 97% 95%  Weight:      Height:       BP (!) 208/97 (BP Location: Left Arm)   Pulse 86   Temp 98.2 F (36.8 C) (Oral)   Resp 15   Ht 4' 11 (1.499 m)   Wt 47 kg   SpO2 99%   BMI 20.93 kg/m   Neurology awake and alert, deconditioned and ill looking appearing ENT with mild pallor with no icterus.   Cardiovascular with S1 and S2 present and regular with no gallops, rubs or murmurs Respiratory with no rales or wheezing, no rhonchi Abdomen with no distention, soft and non tender. No rebound or guarding   No lower extremity edema.  Data Reviewed:   Na 140, K 2.3 Cl 106 bicarbonate 21 glucose 106 bun 12 cr 0,84  AST 13 ALT 8  Wbc 13.2 hgb 9,9 plt 547   EKG 86 bpm, normal axis, normal intervals, qtc 412, sinus rhythm with bilateral atrial enlargement, no significant ST segment or T wave changes.    Assessment and Plan: * Hypokalemia Persistent nausea and vomiting.   Plan to continue K correction with IV and po kcl, will give a total of 80 meq Continue telemetry monitoring until corrected electrolytes  Patient not on diuretic therapy but on chronic Kcl supplementation.   Resistant hypertension Continue blood pressure control with clonidine , amlodipine , labetalol , lisinopril .  Continue blood pressure monitoring.  If persistent Hypokalemia, to consider adding mineralocorticoid receptor blocker.   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  Continue mirtazapine , and duloxetine .   GERD (gastroesophageal reflux disease) Continue antiacid therapy with pantoprazole .  If persistent coffee ground emesis, may need further work up, with endoscopy.   Trigeminal neuralgia pain Continue pain control.   Chronic anemia Positive iron deficiency with reactive thrombocytosis.  Continue oral iron supplementation Check iron stores.       Advance Care Planning:   Code Status: Full Code   Consults: none   Family Communication: I spoke with patient's husband at the bedside, we talked in detail about patient's condition, plan of care and prognosis and all questions were addressed.   Severity of Illness: The appropriate patient status for this patient is OBSERVATION.  Observation status is judged to be reasonable and necessary in order to provide the required intensity of  service to ensure the patient's safety. The patient's presenting symptoms, physical exam findings, and initial radiographic and laboratory data in the context of their medical condition is felt to place them at decreased risk for further clinical deterioration. Furthermore, it is anticipated that the patient will be medically stable for discharge from the hospital within 2 midnights of admission.   Author: Elidia Toribio Furnace, MD 11/02/2023 12:03 AM  For on call review [email or web address].

## 2023-11-02 NOTE — Plan of Care (Signed)

## 2023-11-02 NOTE — Assessment & Plan Note (Signed)
Continue pain control 

## 2023-11-02 NOTE — Discharge Instructions (Signed)
  New primary care options:  Ozell Herald MD [address], [place], [place] [address] Phone: [phone]  [facility] [address] [place], [place], [place] [address] Phone: [phone]

## 2023-11-03 DIAGNOSIS — D649 Anemia, unspecified: Secondary | ICD-10-CM | POA: Diagnosis not present

## 2023-11-03 DIAGNOSIS — F4542 Pain disorder with related psychological factors: Secondary | ICD-10-CM | POA: Diagnosis not present

## 2023-11-03 DIAGNOSIS — I1A0 Resistant hypertension: Secondary | ICD-10-CM | POA: Diagnosis not present

## 2023-11-03 DIAGNOSIS — E876 Hypokalemia: Secondary | ICD-10-CM | POA: Diagnosis not present

## 2023-11-03 LAB — BASIC METABOLIC PANEL WITH GFR
Anion gap: 9 (ref 5–15)
BUN: 11 mg/dL (ref 8–23)
CO2: 22 mmol/L (ref 22–32)
Calcium: 8.6 mg/dL — ABNORMAL LOW (ref 8.9–10.3)
Chloride: 111 mmol/L (ref 98–111)
Creatinine, Ser: 0.69 mg/dL (ref 0.44–1.00)
GFR, Estimated: >60 mL/min (ref >60)
Glucose, Bld: 117 mg/dL — ABNORMAL HIGH (ref 70–99)
Potassium: 3.5 mmol/L (ref 3.5–5.1)
Sodium: 142 mmol/L (ref 135–145)

## 2023-11-03 LAB — MAGNESIUM: Magnesium: 2.5 mg/dL — ABNORMAL HIGH (ref 1.7–2.4)

## 2023-11-03 MED ORDER — PANTOPRAZOLE SODIUM 40 MG PO TBEC: 40.0000 mg | DELAYED_RELEASE_TABLET | Freq: Every day | ORAL | 1 refills | Status: DC

## 2023-11-03 MED ORDER — HYDROMORPHONE HCL 4 MG PO TABS: 4.0000 mg | ORAL_TABLET | Freq: Three times a day (TID) | ORAL | 0 refills | Status: AC | PRN

## 2023-11-03 MED ORDER — HYDROMORPHONE HCL 1 MG/ML IJ SOLN
0.5000 mg | INTRAMUSCULAR | Status: DC | PRN
  Administered 2023-11-03: 0.5 mg via INTRAVENOUS
  Filled 2023-11-03: qty 0.5

## 2023-11-03 NOTE — Hospital Course (Signed)
 63 y.o. female with medical history significant of chronic pain syndrome, anxiety, GERD, hypertension, hyperlipidemia, and trigeminal neuralgia who presented with  persistent left shoulder pain, nausea and vomiting.  She sustained a mechanical fall from her own hight about 3 weeks ago, she was standing by the kitchen sink when her legs gave up and she fell on the floor. Denies any syncope or head trauma. She hit her left shoulder.    09/08 ED visit due to uncontrolled pain., looks like she left prior to being evaluated.  At home she continue to have persistent left shoulder pain, despite taking her oral analgesics, including hydromorphone .   For the last 4 days she has been experiencing nausea and vomiting, coffee ground emesis, along with persistent left shoulder pain.  Decreased po intake, with no diarrhea or frank abdominal pain. No improving or worsening factors.   Because of persistent pain she came back to the hospital.

## 2023-11-03 NOTE — Discharge Summary (Signed)
 Physician Discharge Summary  Renee Gutierrez FMW:[id number] DOB: 05/19/1960 DOA: 11/01/2023   Admit date: 11/01/2023 Discharge date: 11/03/2023  Disposition: Home   Recommendations for Outpatient Follow-up:  Follow up with PCP in 1 weeks Follow up with pain management clinic in 1-2 weeks Please obtain BMP in 1 week  Home Health:  Discharge Condition: STABLE   CODE STATUS: FULL DIET: resume heart health, low sodium diet     Brief Hospitalization Summary: Please see all hospital notes, images, labs for full details of the hospitalization. Admission provider HPI:  63 y.o. female with medical history significant of chronic pain syndrome, anxiety, GERD, hypertension, hyperlipidemia, and trigeminal neuralgia who presented with  persistent left shoulder pain, nausea and vomiting.  She sustained a mechanical fall from her own hight about 3 weeks ago, she was standing by the kitchen sink when her legs gave up and she fell on the floor. Denies any syncope or head trauma. She hit her left shoulder.    09/08 ED visit due to uncontrolled pain., looks like she left prior to being evaluated.  At home she continue to have persistent left shoulder pain, despite taking her oral analgesics, including hydromorphone .   For the last 4 days she has been experiencing nausea and vomiting, coffee ground emesis, along with persistent left shoulder pain.  Decreased po intake, with no diarrhea or frank abdominal pain. No improving or worsening factors.   Because of persistent pain she came back to the hospital.  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  Continue mirtazapine , and duloxetine .  Pt advised to  follow up with her outpatient pain management specialist   GERD (gastroesophageal reflux disease) Continue antiacid therapy with pantoprazole .  If persistent coffee ground emesis, may need further work up, with endoscopy.    Trigeminal neuralgia pain Continue pain control.    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   Resistant hypertension   Hyperlipidemia   GERD (gastroesophageal reflux disease)   Trigeminal neuralgia pain   Chronic anemia   Discharge Instructions:  Allergies as of 11/03/2023       Reactions   Buprenorphine Hcl Swelling   All extremeties and neck and face swelled up huge according to patient   Bupropion Swelling, Other (See Comments)   Moody and depressed   Codeine Itching, Nausea And Vomiting, Nausea Only, Swelling   Sulfamethoxazole-trimethoprim Swelling, Other (See Comments)   Swelled up her eye   Nsaids Other (See Comments)   Unknown    Pregabalin  Swelling   Sulfa Antibiotics Other (See Comments)   Unknown    Tapentadol Nausea And Vomiting   Clindamycin Other (See Comments)   Heart burn   Clindamycin/lincomycin Rash, Other (See Comments)   Heart burn   Cymbalta  [duloxetine  Hcl] Swelling   Diclofenac Swelling   Cream    Diclofenac Sodium Swelling   Oral med   Gabapentin Swelling   Oxycodone  Nausea Only   Toradol  [ketorolac  Tromethamine ] Rash        Medication List     STOP taking these medications  phenytoin  100 MG ER capsule Commonly known as: DILANTIN        TAKE these medications    aspirin  EC 81 MG tablet Take 81 mg by mouth daily.   aspirin -acetaminophen -caffeine  250-250-65 MG tablet Commonly known as: EXCEDRIN  MIGRAINE Take 2 tablets by mouth daily as needed. For pain   Catapres  0.2 MG tablet Generic drug:  cloNIDine  Take 0.4 mg by mouth 2 (two) times daily.   diclofenac Sodium 1 % Gel Commonly known as: VOLTAREN Apply 2 g topically 4 (four) times daily.   DULoxetine  60 MG capsule Commonly known as: CYMBALTA  Take 60 mg by mouth 2 (two) times daily.   ferrous sulfate  325 (65 FE) MG tablet Take 1 tablet by mouth daily.   Gas Relief Extra Strength 125 MG chewable tablet Generic drug: simethicone Chew 125 mg by mouth daily as needed for flatulence.   HYDROmorphone  4 MG tablet Commonly known as: DILAUDID  Take 1 tablet (4 mg total) by mouth every 8 (eight) hours as needed for up to 5 days for severe pain (pain score 7-10). What changed:  when to take this reasons to take this   labetalol  200 MG tablet Commonly known as: NORMODYNE  Take 200 mg by mouth 2 (two) times daily.   lisinopril  40 MG tablet Commonly known as: ZESTRIL  Take 40 mg by mouth daily.   mupirocin ointment 2 % Commonly known as: BACTROBAN Apply 1 Application topically 3 (three) times daily.   naloxone 4 MG/0.1ML Liqd nasal spray kit Commonly known as: NARCAN Place 1 spray into the nose once.   orphenadrine  100 MG tablet Commonly known as: NORFLEX  Take 100 mg by mouth at bedtime.   pantoprazole  40 MG tablet Commonly known as: Protonix  Take 1 tablet (40 mg total) by mouth daily.   prednisoLONE  acetate 1 % ophthalmic suspension Commonly known as: PRED FORTE  1 drop 4 (four) times daily.   pregabalin  50 MG capsule Commonly known as: LYRICA  Take 50 mg by mouth 3 (three) times daily.   promethazine  12.5 MG tablet Commonly known as: PHENERGAN  Take 12.5 mg by mouth every 12 (twelve) hours as needed for nausea.   rOPINIRole  2 MG tablet Commonly known as: REQUIP  Take 2-4 mg by mouth See admin instructions. 2 mg am, 4 mg pm   Vitamin D  (Ergocalciferol ) 1.25 MG (50000 UNIT) Caps capsule Commonly known as: DRISDOL  Take 50,000 Units by mouth every Saturday.        Follow-up Information     primary care  provider. Schedule an appointment as soon as possible for a visit in 1 week(s).   Why: Hospital Follow Up        pain management clinic. Schedule an appointment as soon as possible for a visit in 1 week(s).   Why: Hospital Follow Up               Allergies  Allergen Reactions   Buprenorphine Hcl Swelling    All extremeties and neck and face swelled up huge according to patient   Bupropion Swelling and Other (See Comments)    Moody and depressed   Codeine Itching, Nausea And Vomiting, Nausea Only and Swelling   Sulfamethoxazole-Trimethoprim Swelling and Other (See Comments)    Swelled up her eye   Nsaids Other (See Comments)    Unknown    Pregabalin  Swelling   Sulfa Antibiotics Other (See Comments)    Unknown    Tapentadol Nausea And Vomiting   Clindamycin Other (See Comments)    Heart burn   Clindamycin/Lincomycin  Rash and Other (See Comments)    Heart burn   Cymbalta  [Duloxetine  Hcl] Swelling   Diclofenac Swelling    Cream    Diclofenac Sodium Swelling    Oral med   Gabapentin Swelling   Oxycodone  Nausea Only   Toradol  [Ketorolac  Tromethamine ] Rash   Allergies as of 11/03/2023       Reactions   Buprenorphine Hcl Swelling   All extremeties and neck and face swelled up huge according to patient   Bupropion Swelling, Other (See Comments)   Moody and depressed   Codeine Itching, Nausea And Vomiting, Nausea Only, Swelling   Sulfamethoxazole-trimethoprim Swelling, Other (See Comments)   Swelled up her eye   Nsaids Other (See Comments)   Unknown    Pregabalin  Swelling   Sulfa Antibiotics Other (See Comments)   Unknown    Tapentadol Nausea And Vomiting   Clindamycin Other (See Comments)   Heart burn   Clindamycin/lincomycin Rash, Other (See Comments)   Heart burn   Cymbalta  [duloxetine  Hcl] Swelling   Diclofenac Swelling   Cream    Diclofenac Sodium Swelling   Oral med   Gabapentin Swelling   Oxycodone  Nausea Only   Toradol  [ketorolac  Tromethamine ] Rash         Medication List     STOP taking these medications    phenytoin  100 MG ER capsule Commonly known as: DILANTIN        TAKE these medications    aspirin  EC 81 MG tablet Take 81 mg by mouth daily.   aspirin -acetaminophen -caffeine  250-250-65 MG tablet Commonly known as: EXCEDRIN  MIGRAINE Take 2 tablets by mouth daily as needed. For pain   Catapres  0.2 MG tablet Generic drug: cloNIDine  Take 0.4 mg by mouth 2 (two) times daily.   diclofenac Sodium 1 % Gel Commonly known as: VOLTAREN Apply 2 g topically 4 (four) times daily.   DULoxetine  60 MG capsule Commonly known as: CYMBALTA  Take 60 mg by mouth 2 (two) times daily.   ferrous sulfate  325 (65 FE) MG tablet Take 1 tablet by mouth daily.   Gas Relief Extra Strength 125 MG chewable tablet Generic drug: simethicone Chew 125 mg by mouth daily as needed for flatulence.   HYDROmorphone  4 MG tablet Commonly known as: DILAUDID  Take 1 tablet (4 mg total) by mouth every 8 (eight) hours as needed for up to 5 days for severe pain (pain score 7-10). What changed:  when to take this reasons to take this   labetalol  200 MG tablet Commonly known as: NORMODYNE  Take 200 mg by mouth 2 (two) times daily.   lisinopril  40 MG tablet Commonly known as: ZESTRIL  Take 40 mg by mouth daily.   mupirocin ointment 2 % Commonly known as: BACTROBAN Apply 1 Application topically 3 (three) times daily.   naloxone 4 MG/0.1ML Liqd nasal spray kit Commonly known as: NARCAN Place 1 spray into the nose once.   orphenadrine  100 MG tablet Commonly known as: NORFLEX  Take 100 mg by mouth at bedtime.   pantoprazole  40 MG tablet Commonly known as: Protonix  Take 1 tablet (40 mg total) by mouth daily.   prednisoLONE  acetate 1 % ophthalmic suspension Commonly known as: PRED FORTE  1 drop 4 (four) times daily.   pregabalin  50 MG capsule Commonly known as: LYRICA  Take 50 mg by mouth 3 (three) times daily.   promethazine  12.5 MG  tablet Commonly known as: PHENERGAN  Take 12.5 mg by mouth every 12 (twelve) hours as needed for nausea.   rOPINIRole  2 MG tablet Commonly known  as: REQUIP  Take 2-4 mg by mouth See admin instructions. 2 mg am, 4 mg pm   Vitamin D  (Ergocalciferol ) 1.25 MG (50000 UNIT) Caps capsule Commonly known as: DRISDOL  Take 50,000 Units by mouth every Saturday.        Procedures/Studies: No results found.   Subjective:   Discharge Exam: Vitals:   11/02/23 2219 11/03/23 0448  BP: (!) 168/79 (!) 127/59  Pulse: 84 79  Resp: 20 18  Temp: 98 F (36.7 C) 98.2 F (36.8 C)  SpO2: 96% 94%   Vitals:   11/02/23 1453 11/02/23 2030 11/02/23 2219 11/03/23 0448  BP: 132/65 130/70 (!) 168/79 (!) 127/59  Pulse: 80 92 84 79  Resp: 19 20 20 18   Temp: 98.1 F (36.7 C) 98.7 F (37.1 C) 98 F (36.7 C) 98.2 F (36.8 C)  TempSrc: Oral Oral Oral Oral  SpO2: 94% 95% 96% 94%  Weight:      Height:        General: Pt is alert, awake, not in acute distress Cardiovascular: RRR, S1/S2 +, no rubs, no gallops Respiratory: CTA bilaterally, no wheezing, no rhonchi Abdominal: Soft, NT, ND, bowel sounds + Extremities: no edema, no cyanosis   The results of significant diagnostics from this hospitalization (including imaging, microbiology, ancillary and laboratory) are listed below for reference.     Microbiology: No results found for this or any previous visit (from the past 240 hours).   Labs: BNP (last 3 results) No results for input(s): BNP in the last 8760 hours. Basic Metabolic Panel: Recent Labs  Lab 11/01/23 2305 11/02/23 0454 11/03/23 0346  NA 140 138 142  K 2.3* 3.5 3.5  CL 106 109 111  CO2 21* 19* 22  GLUCOSE 106* 159* 117*  BUN 12 10 11   CREATININE 0.84 0.74 0.69  CALCIUM  9.0 8.3* 8.6*  MG  --  1.8 2.5*   Liver Function Tests: Recent Labs  Lab 11/01/23 2305  AST 13*  ALT 8  ALKPHOS 79  BILITOT 0.3  PROT 6.1*  ALBUMIN 2.6*   No results for input(s): LIPASE,  AMYLASE in the last 168 hours. No results for input(s): AMMONIA in the last 168 hours. CBC: Recent Labs  Lab 11/01/23 2305 11/02/23 0454  WBC 13.2* 12.3*  NEUTROABS 9.2*  --   HGB 9.9* 10.0*  HCT 30.9* 31.1*  MCV 89.6 89.4  PLT 547* 505*   Cardiac Enzymes: No results for input(s): CKTOTAL, CKMB, CKMBINDEX, TROPONINI in the last 168 hours. BNP: Invalid input(s): POCBNP CBG: No results for input(s): GLUCAP in the last 168 hours. D-Dimer No results for input(s): DDIMER in the last 72 hours. Hgb A1c No results for input(s): HGBA1C in the last 72 hours. Lipid Profile No results for input(s): CHOL, HDL, LDLCALC, TRIG, CHOLHDL, LDLDIRECT in the last 72 hours. Thyroid function studies No results for input(s): TSH, T4TOTAL, T3FREE, THYROIDAB in the last 72 hours.  Invalid input(s): FREET3 Anemia work up Recent Labs    11/01/23 2305  FERRITIN 29  TIBC 283  IRON 11*   Urinalysis    Component Value Date/Time   COLORURINE YELLOW 05/18/2016 0953   APPEARANCEUR CLEAR 05/18/2016 0953   LABSPEC 1.020 05/18/2016 0953   PHURINE 5.0 05/18/2016 0953   GLUCOSEU NEGATIVE 05/18/2016 0953   HGBUR NEGATIVE 05/18/2016 0953   BILIRUBINUR NEGATIVE 05/18/2016 0953   KETONESUR NEGATIVE 05/18/2016 0953   PROTEINUR NEGATIVE 05/18/2016 0953   UROBILINOGEN 0.2 12/03/2014 1500   NITRITE NEGATIVE 05/18/2016 0953   LEUKOCYTESUR NEGATIVE  05/18/2016 0953   Sepsis Labs Recent Labs  Lab 11/01/23 2305 11/02/23 0454  WBC 13.2* 12.3*   Microbiology No results found for this or any previous visit (from the past 240 hours).  Time coordinating discharge: 32 mins   SIGNED:  Afton Louder, MD  [facility] 11/03/2023, 10:36 AM How to contact the [facility] Attending or Consulting provider 7A - 7P or covering provider during after hours 7P -7A, for this patient?  Check the care team in [facility] and look for a) attending/consulting [facility] provider listed and b)  the [facility] team listed Log into [email or web address] and use [facility]'s universal password to access. If you do not have the password, please contact the hospital operator. Locate the [facility] provider you are looking for under [facility] and page to a number that you can be directly reached. If you still have difficulty reaching the provider, please page the [facility] (Director on Call) for the Hospitalists listed on amion for assistance.

## 2023-11-03 NOTE — Plan of Care (Signed)

## 2023-11-04 NOTE — Progress Notes (Signed)
 [facility] Follow Up Visit   This video encounter was conducted with the patient's (or proxy's) verbal consent via secure, interactive audio and video telecommunications while in clinic/office/hospital.  The patient (or proxy) was instructed to have this encounter in a suitably private space and to only have persons present to whom they give permission to participate. In addition, patient identity was confirmed by use of name plus an additional identifier.  I spent a total of 17 minutes in face-to-face consultation with the patient.   OPIOID MONITORING PARAMETER DATE COMMENT  PAIN CONTRACT SIGNED: 09/05/2023 Renewed   LAST URINE DRUG SCREEN 12/24/2022 04/01/2023  07/08/2023     09/05/2023 + morphine , appropriate + ketamine , neg for all other tested  + morphine , dilaudid , nor fentanyl  (recent hospitalization)  Neg for all tested drugs   NCCSRS NCCSRS Website 04/30/2023 appropriate    [place] Offender Database Accessed [place] Offender Database Website            04/30/2023      no criminal history  H/O ABERRANT BEHAVIORS  none  Narcan Rx 09/23/2022 Rx Prescribed   PDMP review:     CHIEF COMPLAINT: left sided facial pain and left cervical muscles   HISTORY OF PRESENT ILLNESS: Renee Gutierrez is 63 y.o. female returning for follow up in the pain clinic. The patient is interviewed and examined, chart is reviewed, and the plan is formulated by me personally.    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   She experiences significant pain, described as 'off the wall,' which is exacerbated by rainy weather. The pain is primarily located in her mouth, face, and  shoulder, with notable discomfort in the collarbone. Swelling in her face and mouth makes it difficult to talk. No chest pain, shortness of breath, or palpitations.  She is currently taking Dilaudid  and morphine  for pain management. She finds Dilaudid  ineffective and prefers morphine  for its longer-lasting relief. A urologist advised her to take Dilaudid  four to five times a day, but she prefers morphine . She is on morphine  15 mg extended release and has been prescribed Lyrica  three times a day for the past two weeks.  She was recently hospitalized at [facility] from September 13th to September 15th for low potassium levels, which were corrected during her stay. During hospitalization, she experienced high blood pressure, which she attributes to pain, and was placed on a heart monitor. She also experienced severe vomiting, including hematemesis, initially thought to be related to a dental issue but later realized to be from her stomach.  She reports significant weight loss, now weighing 90 pounds, and is receiving physical therapy at home to strengthen her legs.   Interval pain history from 09/05/2023: Renee Gutierrez is a 64 year old female who presents with severe pain.  She experiences severe pain, rated as eight out of ten, which is difficult to manage. The pain is slightly better today compared to other days. She is taking Dilaudid  4 mg twice a day, although the prescription allows for three times a day, which may contribute to breakthrough pain. She also uses Norflex  at night to aid sleep but has run out of it.  She describes daily vomiting of black, ashy material, which has been ongoing.  She visited the emergency room due to the severity of the vomiting but left after a negative interaction with a doctor. She suspects the vomiting may be related to running out of Protonix , which she finds helpful when taken. She has not followed up with a GI doctor or her primary doctor yet.  She  recounts a recent fall that resulted in a loss of consciousness and a large lump, causing pain in her left arm and neck. Despite this, she continues to perform housework but struggles with sleep, having slept poorly all week.  She has a history of keratitis, which recently caused significant swelling in her eye and face. She is currently on prednisone for this condition and was on an ATB as well. She expresses a need for better pain management.  Today we renewed her Dilaudid  and Morphine  ER.    UDS collected today and Opioid agreement renewed today.    07/08/2023: Renee Gutierrez returns to the pain clinic regarding her left sided facial pain and left cervical muscle pain.  Since the last office visit she was hospitalized x 2 and seen in the ED x 2.  Last was 07/06/2023 in the ED for Trigeminal neuralgia pain. CT of the head was done and shows no evidence of infarction, hemorrhage, hydrocephalus, mass/lesion.  She was treated for her acute TN pain with Renee Contin  15 mg, Dilaudid  0.5 mg IV with a repeat of 1 mg IV, Compazine , Benadryl  and Morphine  15 mg.    After a long discussion about her pain we agreed to switch to Dilaudid  4 mg TID prn with Exalgo  8 mg once daily.  She will take a short acting in AM and then 2 hours later take her long acting.  She can repeat a short acting at dinner time and then again at bedtime.  She states her pain gets worse as the day goes on.  Adding the long acting help decrease the intensity of the pain.  She can use the short acting for BTP.    Today she advised me that the Morphine  was not providing enough benefit but was also causing itching and nausea.    Her pain is rated 9/10 today.  She describes the pain as aching, burning, stabbing.  She gets frequent headaches on the left side of her head and face.  The pain radiates upward toward her temple area.  It makes it difficult to eat.  We discussed cooking softer foods such as mashed potatoes, steamed, softened broccoli (to  help with low potasium) and supplementing with ensure or boost drinks.    She has every 3 week visits with her eye doctor.   Pain Medications       Disp Refills Start End   acetaminophen  (TYLENOL ) 325 MG tablet -- -- 10/16/2018 --   Sig - Route: Take 650 mg by mouth every 6 (six) hours as needed    - Oral   Class: Historical Med   DULoxetine  (CYMBALTA ) 60 MG DR capsule 180 capsule 0 09/05/2023 12/04/2023   Sig - Route: Take 1 capsule (60 mg total) by mouth 2 (two) times daily for 90 days - Oral   Class: Electronic   morphine  (Renee CONTIN ) 15 MG ER tablet 60 tablet 0 11/05/2023 --   Sig - Route: Take 1 tablet (15 mg total) by mouth every 12 (twelve) hours - Oral   Class: Electronic   No prior authorization was found for this prescription.   Found prior authorization for another prescription for the  same medication: Closed - Prior Authorization not required for patient/medication   morphine  (Renee CONTIN ) 15 MG ER tablet 60 tablet 0 12/05/2023 --   Sig - Route: Take 1 tablet (15 mg total) by mouth every 12 (twelve) hours - Oral   Class: Electronic   No prior authorization was found for this prescription.   Found prior authorization for another prescription for the same medication: Closed - Prior Authorization not required for patient/medication   naloxone [facility]) 4 mg/actuation nasal spray 2 each 1 11/05/2023 11/05/2023   Sig - Route: Place 1 spray (4 mg total) into one nostril once as needed (if not breathing or overdose is suspected.) for up to 1 dose Give 2nd dose in 2-3 min if not responding or if sx return for up to 1 dose, call 911. - Nasal   Class: Electronic   orphenadrine  (NORFLEX ) 100 mg ER tablet 30 tablet 1 09/05/2023 --   Sig: Tale 1 tablet at HS prn   Class: Electronic   Prior authorization: Closed - Other   pregabalin  (LYRICA ) 50 MG capsule 90 capsule 2 11/05/2023 --   Sig - Route: Take 1 capsule (50 mg total) by mouth 3 (three) times daily - Oral   Class: Electronic   morphine   (MSIR) 15 MG immediate release tablet 60 tablet 0 11/05/2023 --   Sig - Route: Take 1 tablet (15 mg total) by mouth every 4 (four) hours as needed for Pain - Oral   Class: Electronic   morphine  (MSIR) 15 MG immediate release tablet 60 tablet 0 12/05/2023 --   Sig - Route: Take 1 tablet (15 mg total) by mouth every 4 (four) hours as needed for Pain - Oral   Class: Electronic       Patient is participating in medical psychology therapy.   Brief Pain Inventory:          PHQ-9:      ROS: I have personally reviewed the ROS as documented in the ancillary staff note from 11/05/2023.  Current Outpatient Medications  Medication Sig Dispense Refill  . acetaminophen  (TYLENOL ) 325 MG tablet Take 650 mg by mouth every 6 (six) hours as needed       . acetaminophen /diphenhydramine  (TYLENOL  PM ORAL) Take by mouth    . atorvastatin  (LIPITOR) 10 MG tablet     . cloNIDine  HCl (CATAPRES ) 0.2 MG tablet TAKE 1 TABLET BY MOUTH TWICE DAILY 180 tablet 2  . DULoxetine  (CYMBALTA ) 60 MG DR capsule Take 1 capsule (60 mg total) by mouth 2 (two) times daily for 90 days 180 capsule 0  . ergocalciferol , vitamin D2, 1,250 mcg (50,000 unit) capsule Take 1 capsule every week by oral route for 90 days.    . erythromycin  [facility]) ophthalmic ointment Place into the left eye 2 (two) times daily 3.5 g 11  . labetalol  (TRANDATE ) 200 MG tablet TAKE 1 TABLET BY MOUTH TWICE DAILY 180 tablet 0  . lisinopril  (PRINIVIL ,ZESTRIL ) 40 MG tablet TAKE 1 TABLET(40 MG) BY MOUTH EVERY DAY 90 tablet 2  . morphine  (Renee CONTIN ) 15 MG ER tablet Take 1 tablet (15 mg total) by mouth every 12 (twelve) hours 60 tablet 0  . [START ON 12/05/2023] morphine  (Renee CONTIN ) 15 MG ER tablet Take 1 tablet (15 mg total) by mouth every 12 (twelve) hours 60 tablet 0  . mupirocin (BACTROBAN) 2 % ointment Apply topically 3 (three) times daily To left notril    . naloxone (NARCAN) 4 mg/actuation nasal spray Place 1 spray (4 mg  total) into one nostril once as  needed (if not breathing or overdose is suspected.) for up to 1 dose Give 2nd dose in 2-3 min if not responding or if sx return for up to 1 dose, call 911. 2 each 1  . orphenadrine  (NORFLEX ) 100 mg ER tablet Tale 1 tablet at HS prn 30 tablet 1  . pantoprazole  (PROTONIX ) 40 MG DR tablet     . prednisoLONE  acetate (PRED FORTE ) 1 % ophthalmic suspension     . pregabalin  (LYRICA ) 50 MG capsule Take 1 capsule (50 mg total) by mouth 3 (three) times daily 90 capsule 2  . promethazine  (PHENERGAN ) 12.5 MG tablet Take 1 tablet (12.5 mg total) by mouth every 12 (twelve) hours as needed for Nausea for up to 90 days 60 tablet 2  . rOPINIRole  (REQUIP ) 2 MG immediate release tablet Take 1 tablet (2 mg total) by mouth 3 (three) times daily 270 tablet 1  . simethicone 125 mg Cap Take by mouth    . morphine  (MSIR) 15 MG immediate release tablet Take 1 tablet (15 mg total) by mouth every 4 (four) hours as needed for Pain 60 tablet 0  . [START ON 12/05/2023] morphine  (MSIR) 15 MG immediate release tablet Take 1 tablet (15 mg total) by mouth every 4 (four) hours as needed for Pain 60 tablet 0   No current facility-administered medications for this visit.    Objective: PHYSICAL EXAM:   There were no vitals taken for this visit.  GENERAL: Renee Gutierrez is a female in no distress.   PSYC: Patient is alert and oriented x 3. Affect is normal. EYES: no scleral icterus.  Left eye droop  CV: appears well perfused PULM:  Even, nonlabored breathing. No audible wheezing. SKIN:  No rash or lesions apparent.   NEURO:  Speech is fluent. Recent and remote memory are Intact. CN II-XII grossly  Intact.    IMPRESSION: Encounter Diagnoses  Name Primary?  . Chronic pain syndrome   . Trigeminal neuralgia pain   . Encounter for long-term opiate analgesic use       Patient Active Problem List  Diagnosis  . Low back pain  . Fibromyalgia  . Lumbar radicular pain  . Severe episode of recurrent major depressive disorder  (CMS/HHS-HCC)  . IBS (irritable bowel syndrome)  . GAD (generalized anxiety disorder)  . 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 Spinal stenosis of lumbar region with neurogenic claudication  . Cubital tunnel syndrome  . Numbness in both hands  . Carpal tunnel syndrome on both sides  . Myofascial pain syndrome  . Osteoarthritis of cervical spine, unspecified spinal osteoarthritis complication status  . Prolactinoma (CMS/HHS-HCC)  . Adrenal adenoma, right  . Idiopathic peripheral neuropathy  . Mild intermittent asthma without complication (HHS-HCC)  . Ganglion cyst of dorsum of right wrist  . Carpal tunnel syndrome on right  . Dural arteriovenous fistula (HHS-HCC)  . Epistaxis  . Resistant hypertension  . Atypical facial pain  . Acute viral conjunctivitis of left eye  . Trigeminal neuralgia pain  . ACTH elevation  . Long-term current use of opiate analgesic  . GERD (gastroesophageal reflux disease)       Plan:   Assessment & Plan Chronic pain syndrome with trigeminal neuralgia Chronic pain with facial and shoulder involvement. Morphine  more effective than Dilaudid . - Discontinue Dilaudid . - Prescribe morphine  15 mg extended release twice daily. - Prescribe morphine  15 mg immediate release for breakthrough pain.  Vomiting Recurrent vomiting  with hematemesis. Awaiting GI specialist evaluation. - Schedule an appointment with a GI specialist for evaluation of vomiting and potential gastrointestinal bleeding.  Unintentional weight loss Significant weight loss addressed during hospital stay. Home physical therapy initiated. - Continue with home physical therapy to improve strength and address weight loss.  Hypertension Elevated blood pressure likely exacerbated by pain. Monitored in hospital.   Requested Prescriptions   Signed Prescriptions Disp Refills  . morphine  (Renee CONTIN ) 15 MG ER tablet 60 tablet 0    Sig: Take 1 tablet (15 mg total) by  mouth every 12 (twelve) hours  . naloxone (NARCAN) 4 mg/actuation nasal spray 2 each 1    Sig: Place 1 spray (4 mg total) into one nostril once as needed (if not breathing or overdose is suspected.) for up to 1 dose Give 2nd dose in 2-3 min if not responding or if sx return for up to 1 dose, call 911.  . pregabalin  (LYRICA ) 50 MG capsule 90 capsule 2    Sig: Take 1 capsule (50 mg total) by mouth 3 (three) times daily  . morphine  (Renee CONTIN ) 15 MG ER tablet 60 tablet 0    Sig: Take 1 tablet (15 mg total) by mouth every 12 (twelve) hours  . morphine  (MSIR) 15 MG immediate release tablet 60 tablet 0    Sig: Take 1 tablet (15 mg total) by mouth every 4 (four) hours as needed for Pain  . morphine  (MSIR) 15 MG immediate release tablet 60 tablet 0    Sig: Take 1 tablet (15 mg total) by mouth every 4 (four) hours as needed for Pain    No orders of the defined types were placed in this encounter.    Patient was counseled to maintain normal activity and avoid prolonged bed rest.  Management of other conditions as per Primary care provider.    Follow up in 2 months    Attestation Statement:   I personally performed the service, non-incident to. [facility])   This note has been created using automated tools and reviewed for accuracy by DAIL A CIANI.   DAIL A CIANI, NP

## 2023-11-10 ENCOUNTER — Telehealth: Payer: Self-pay | Admitting: Gastroenterology

## 2023-11-10 NOTE — Telephone Encounter (Signed)
 I have spoke to Renee Gutierrez several times this morning and have given her her apt and her husbands apt for 11/25/23

## 2023-11-10 NOTE — Telephone Encounter (Signed)
 This patient has called [facility] [facility] Office several times this morning leaving messages about her referral.  She is very hard to understand on the messages, speech is slurred.

## 2023-11-12 ENCOUNTER — Encounter (HOSPITAL_COMMUNITY): Payer: Self-pay

## 2023-11-12 ENCOUNTER — Other Ambulatory Visit: Payer: Self-pay

## 2023-11-12 ENCOUNTER — Emergency Department (HOSPITAL_COMMUNITY)
Admission: EM | Admit: 2023-11-12 | Discharge: 2023-11-12 | Discharge status: Against Medical Advice | Attending: Emergency Medicine | Admitting: Emergency Medicine

## 2023-11-12 DIAGNOSIS — Z5321 Procedure and treatment not carried out due to patient leaving prior to being seen by health care provider: Secondary | ICD-10-CM | POA: Insufficient documentation

## 2023-11-12 DIAGNOSIS — M7989 Other specified soft tissue disorders: Secondary | ICD-10-CM | POA: Diagnosis present

## 2023-11-12 DIAGNOSIS — R600 Localized edema: Secondary | ICD-10-CM | POA: Diagnosis not present

## 2023-11-12 DIAGNOSIS — M79661 Pain in right lower leg: Secondary | ICD-10-CM | POA: Insufficient documentation

## 2023-11-12 DIAGNOSIS — M79662 Pain in left lower leg: Secondary | ICD-10-CM | POA: Diagnosis not present

## 2023-11-12 LAB — CBC WITH DIFFERENTIAL/PLATELET
Abs Immature Granulocytes: 0.05 K/uL (ref 0.00–0.07)
Basophils Absolute: 0.1 K/uL (ref 0.0–0.1)
Basophils Relative: 1 %
Eosinophils Absolute: 0.2 K/uL (ref 0.0–0.5)
Eosinophils Relative: 3 %
HCT: 26.7 % — ABNORMAL LOW (ref 36.0–46.0)
Hemoglobin: 8.1 g/dL — ABNORMAL LOW (ref 12.0–15.0)
Immature Granulocytes: 1 %
Lymphocytes Relative: 26 %
Lymphs Abs: 2 K/uL (ref 0.7–4.0)
MCH: 28 pg (ref 26.0–34.0)
MCHC: 30.3 g/dL (ref 30.0–36.0)
MCV: 92.4 fL (ref 80.0–100.0)
Monocytes Absolute: 0.7 K/uL (ref 0.1–1.0)
Monocytes Relative: 9 %
Neutro Abs: 4.6 K/uL (ref 1.7–7.7)
Neutrophils Relative %: 60 %
Platelets: 454 K/uL — ABNORMAL HIGH (ref 150–400)
RBC: 2.89 MIL/uL — ABNORMAL LOW (ref 3.87–5.11)
RDW: 14.3 % (ref 11.5–15.5)
WBC: 7.7 K/uL (ref 4.0–10.5)
nRBC: 0 % (ref 0.0–0.2)

## 2023-11-12 LAB — COMPREHENSIVE METABOLIC PANEL WITH GFR
ALT: 12 U/L (ref 0–44)
AST: 14 U/L — ABNORMAL LOW (ref 15–41)
Albumin: 2.5 g/dL — ABNORMAL LOW (ref 3.5–5.0)
Alkaline Phosphatase: 79 U/L (ref 38–126)
Anion gap: 8 (ref 5–15)
BUN: 13 mg/dL (ref 8–23)
CO2: 24 mmol/L (ref 22–32)
Calcium: 8.6 mg/dL — ABNORMAL LOW (ref 8.9–10.3)
Chloride: 107 mmol/L (ref 98–111)
Creatinine, Ser: 0.63 mg/dL (ref 0.44–1.00)
GFR, Estimated: >60 mL/min (ref >60)
Glucose, Bld: 105 mg/dL — ABNORMAL HIGH (ref 70–99)
Potassium: 3.5 mmol/L (ref 3.5–5.1)
Sodium: 139 mmol/L (ref 135–145)
Total Bilirubin: 0.4 mg/dL (ref 0.0–1.2)
Total Protein: 5.6 g/dL — ABNORMAL LOW (ref 6.5–8.1)

## 2023-11-12 LAB — BRAIN NATRIURETIC PEPTIDE: B Natriuretic Peptide: 132 pg/mL — ABNORMAL HIGH (ref 0.0–100.0)

## 2023-11-12 NOTE — ED Notes (Signed)
 Pt reports she has the constant urge to micturate but is only able to get just a few drops of urine out.

## 2023-11-12 NOTE — ED Triage Notes (Signed)
 Pt arrived via POV from home c/o bilateral lower extremity swelling X 3 days. Pt reports legs are feeling painful as well.

## 2023-11-12 NOTE — ED Notes (Signed)
 Called for Pt multiple times from waiting room. No answer. Registration staff informed this RN after that the Pts husband was growing agitated with the wait time.

## 2023-11-25 ENCOUNTER — Ambulatory Visit (INDEPENDENT_AMBULATORY_CARE_PROVIDER_SITE_OTHER): Admitting: Gastroenterology

## 2023-11-25 ENCOUNTER — Inpatient Hospital Stay (HOSPITAL_COMMUNITY)
Admission: EM | Admit: 2023-11-25 | Discharge: 2023-11-29 | DRG: 640 | Disposition: A | Discharge status: Home / Self Care | Attending: Internal Medicine | Admitting: Internal Medicine

## 2023-11-25 ENCOUNTER — Emergency Department (HOSPITAL_COMMUNITY)

## 2023-11-25 ENCOUNTER — Encounter (HOSPITAL_COMMUNITY): Payer: Self-pay

## 2023-11-25 ENCOUNTER — Other Ambulatory Visit: Payer: Self-pay

## 2023-11-25 DIAGNOSIS — R5381 Other malaise: Secondary | ICD-10-CM | POA: Diagnosis present

## 2023-11-25 DIAGNOSIS — T39395A Adverse effect of other nonsteroidal anti-inflammatory drugs [NSAID], initial encounter: Secondary | ICD-10-CM | POA: Diagnosis present

## 2023-11-25 DIAGNOSIS — K529 Noninfective gastroenteritis and colitis, unspecified: Secondary | ICD-10-CM | POA: Diagnosis not present

## 2023-11-25 DIAGNOSIS — H538 Other visual disturbances: Secondary | ICD-10-CM | POA: Diagnosis present

## 2023-11-25 DIAGNOSIS — H5000 Unspecified esotropia: Secondary | ICD-10-CM | POA: Diagnosis present

## 2023-11-25 DIAGNOSIS — R531 Weakness: Secondary | ICD-10-CM | POA: Diagnosis present

## 2023-11-25 DIAGNOSIS — I10 Essential (primary) hypertension: Secondary | ICD-10-CM | POA: Diagnosis present

## 2023-11-25 DIAGNOSIS — Z5941 Food insecurity: Secondary | ICD-10-CM

## 2023-11-25 DIAGNOSIS — R1314 Dysphagia, pharyngoesophageal phase: Secondary | ICD-10-CM | POA: Diagnosis present

## 2023-11-25 DIAGNOSIS — Z1152 Encounter for screening for COVID-19: Secondary | ICD-10-CM | POA: Diagnosis not present

## 2023-11-25 DIAGNOSIS — K2210 Ulcer of esophagus without bleeding: Secondary | ICD-10-CM | POA: Diagnosis present

## 2023-11-25 DIAGNOSIS — K5909 Other constipation: Secondary | ICD-10-CM | POA: Diagnosis not present

## 2023-11-25 DIAGNOSIS — E785 Hyperlipidemia, unspecified: Secondary | ICD-10-CM | POA: Diagnosis present

## 2023-11-25 DIAGNOSIS — K5903 Drug induced constipation: Secondary | ICD-10-CM | POA: Diagnosis present

## 2023-11-25 DIAGNOSIS — R112 Nausea with vomiting, unspecified: Secondary | ICD-10-CM | POA: Diagnosis present

## 2023-11-25 DIAGNOSIS — K581 Irritable bowel syndrome with constipation: Secondary | ICD-10-CM | POA: Diagnosis present

## 2023-11-25 DIAGNOSIS — F17210 Nicotine dependence, cigarettes, uncomplicated: Secondary | ICD-10-CM | POA: Diagnosis present

## 2023-11-25 DIAGNOSIS — Z7982 Long term (current) use of aspirin: Secondary | ICD-10-CM

## 2023-11-25 DIAGNOSIS — K449 Diaphragmatic hernia without obstruction or gangrene: Secondary | ICD-10-CM | POA: Diagnosis present

## 2023-11-25 DIAGNOSIS — K2970 Gastritis, unspecified, without bleeding: Secondary | ICD-10-CM | POA: Diagnosis present

## 2023-11-25 DIAGNOSIS — G43909 Migraine, unspecified, not intractable, without status migrainosus: Secondary | ICD-10-CM | POA: Diagnosis present

## 2023-11-25 DIAGNOSIS — Z885 Allergy status to narcotic agent status: Secondary | ICD-10-CM | POA: Diagnosis not present

## 2023-11-25 DIAGNOSIS — R9431 Abnormal electrocardiogram [ECG] [EKG]: Secondary | ICD-10-CM | POA: Diagnosis present

## 2023-11-25 DIAGNOSIS — E876 Hypokalemia: Secondary | ICD-10-CM | POA: Diagnosis present

## 2023-11-25 DIAGNOSIS — T402X5A Adverse effect of other opioids, initial encounter: Secondary | ICD-10-CM | POA: Diagnosis present

## 2023-11-25 DIAGNOSIS — D62 Acute posthemorrhagic anemia: Secondary | ICD-10-CM | POA: Diagnosis present

## 2023-11-25 DIAGNOSIS — K259 Gastric ulcer, unspecified as acute or chronic, without hemorrhage or perforation: Secondary | ICD-10-CM | POA: Diagnosis not present

## 2023-11-25 DIAGNOSIS — G2581 Restless legs syndrome: Secondary | ICD-10-CM | POA: Diagnosis present

## 2023-11-25 DIAGNOSIS — K250 Acute gastric ulcer with hemorrhage: Secondary | ICD-10-CM | POA: Diagnosis present

## 2023-11-25 DIAGNOSIS — K279 Peptic ulcer, site unspecified, unspecified as acute or chronic, without hemorrhage or perforation: Secondary | ICD-10-CM | POA: Diagnosis not present

## 2023-11-25 DIAGNOSIS — F419 Anxiety disorder, unspecified: Secondary | ICD-10-CM | POA: Diagnosis present

## 2023-11-25 DIAGNOSIS — Z79891 Long term (current) use of opiate analgesic: Secondary | ICD-10-CM

## 2023-11-25 DIAGNOSIS — M797 Fibromyalgia: Secondary | ICD-10-CM | POA: Diagnosis present

## 2023-11-25 DIAGNOSIS — R636 Underweight: Secondary | ICD-10-CM | POA: Diagnosis present

## 2023-11-25 DIAGNOSIS — R000 Tachycardia, unspecified: Secondary | ICD-10-CM | POA: Diagnosis not present

## 2023-11-25 DIAGNOSIS — K3189 Other diseases of stomach and duodenum: Secondary | ICD-10-CM | POA: Diagnosis not present

## 2023-11-25 DIAGNOSIS — Z8679 Personal history of other diseases of the circulatory system: Secondary | ICD-10-CM

## 2023-11-25 DIAGNOSIS — R519 Headache, unspecified: Secondary | ICD-10-CM | POA: Diagnosis present

## 2023-11-25 DIAGNOSIS — K219 Gastro-esophageal reflux disease without esophagitis: Secondary | ICD-10-CM | POA: Diagnosis present

## 2023-11-25 DIAGNOSIS — Z881 Allergy status to other antibiotic agents status: Secondary | ICD-10-CM

## 2023-11-25 DIAGNOSIS — Z681 Body mass index (BMI) 19 or less, adult: Secondary | ICD-10-CM

## 2023-11-25 DIAGNOSIS — D649 Anemia, unspecified: Secondary | ICD-10-CM | POA: Diagnosis not present

## 2023-11-25 DIAGNOSIS — K253 Acute gastric ulcer without hemorrhage or perforation: Secondary | ICD-10-CM | POA: Diagnosis not present

## 2023-11-25 DIAGNOSIS — G500 Trigeminal neuralgia: Secondary | ICD-10-CM | POA: Diagnosis present

## 2023-11-25 DIAGNOSIS — K254 Chronic or unspecified gastric ulcer with hemorrhage: Secondary | ICD-10-CM | POA: Diagnosis not present

## 2023-11-25 DIAGNOSIS — Z86018 Personal history of other benign neoplasm: Secondary | ICD-10-CM

## 2023-11-25 DIAGNOSIS — Z886 Allergy status to analgesic agent status: Secondary | ICD-10-CM

## 2023-11-25 DIAGNOSIS — K208 Other esophagitis without bleeding: Secondary | ICD-10-CM | POA: Diagnosis not present

## 2023-11-25 DIAGNOSIS — Z79899 Other long term (current) drug therapy: Secondary | ICD-10-CM

## 2023-11-25 DIAGNOSIS — M1990 Unspecified osteoarthritis, unspecified site: Secondary | ICD-10-CM | POA: Diagnosis present

## 2023-11-25 DIAGNOSIS — Z5948 Other specified lack of adequate food: Secondary | ICD-10-CM

## 2023-11-25 DIAGNOSIS — Z90710 Acquired absence of both cervix and uterus: Secondary | ICD-10-CM

## 2023-11-25 DIAGNOSIS — G894 Chronic pain syndrome: Secondary | ICD-10-CM | POA: Diagnosis present

## 2023-11-25 DIAGNOSIS — Z888 Allergy status to other drugs, medicaments and biological substances status: Secondary | ICD-10-CM

## 2023-11-25 DIAGNOSIS — H5712 Ocular pain, left eye: Secondary | ICD-10-CM | POA: Diagnosis present

## 2023-11-25 DIAGNOSIS — Z882 Allergy status to sulfonamides status: Secondary | ICD-10-CM

## 2023-11-25 DIAGNOSIS — K5904 Chronic idiopathic constipation: Secondary | ICD-10-CM | POA: Diagnosis present

## 2023-11-25 DIAGNOSIS — Z8701 Personal history of pneumonia (recurrent): Secondary | ICD-10-CM

## 2023-11-25 LAB — CBC
HCT: 28.5 % — ABNORMAL LOW (ref 36.0–46.0)
Hemoglobin: 8.8 g/dL — ABNORMAL LOW (ref 12.0–15.0)
MCH: 27.2 pg (ref 26.0–34.0)
MCHC: 30.9 g/dL (ref 30.0–36.0)
MCV: 88 fL (ref 80.0–100.0)
Platelets: 712 K/uL — ABNORMAL HIGH (ref 150–400)
RBC: 3.24 MIL/uL — ABNORMAL LOW (ref 3.87–5.11)
RDW: 14.1 % (ref 11.5–15.5)
WBC: 10.8 K/uL — ABNORMAL HIGH (ref 4.0–10.5)
nRBC: 0 % (ref 0.0–0.2)

## 2023-11-25 LAB — URINALYSIS, W/ REFLEX TO CULTURE (INFECTION SUSPECTED)
Bacteria, UA: NONE SEEN
Bilirubin Urine: NEGATIVE
Glucose, UA: NEGATIVE mg/dL
Hgb urine dipstick: NEGATIVE
Ketones, ur: 20 mg/dL — AB
Leukocytes,Ua: NEGATIVE
Nitrite: NEGATIVE
Protein, ur: NEGATIVE mg/dL
Specific Gravity, Urine: 1.011 (ref 1.005–1.030)
pH: 7 (ref 5.0–8.0)

## 2023-11-25 LAB — COMPREHENSIVE METABOLIC PANEL WITH GFR
ALT: 9 U/L (ref 0–44)
AST: 20 U/L (ref 15–41)
Albumin: 3.5 g/dL (ref 3.5–5.0)
Alkaline Phosphatase: 79 U/L (ref 38–126)
Anion gap: 17 — ABNORMAL HIGH (ref 5–15)
BUN: 7 mg/dL — ABNORMAL LOW (ref 8–23)
CO2: 23 mmol/L (ref 22–32)
Calcium: 9.2 mg/dL (ref 8.9–10.3)
Chloride: 101 mmol/L (ref 98–111)
Creatinine, Ser: 0.79 mg/dL (ref 0.44–1.00)
GFR, Estimated: >60 mL/min (ref >60)
Glucose, Bld: 99 mg/dL (ref 70–99)
Potassium: 2.8 mmol/L — ABNORMAL LOW (ref 3.5–5.1)
Sodium: 140 mmol/L (ref 135–145)
Total Bilirubin: <0.2 mg/dL (ref 0.0–1.2)
Total Protein: 6.3 g/dL — ABNORMAL LOW (ref 6.5–8.1)

## 2023-11-25 LAB — RESP PANEL BY RT-PCR (RSV, FLU A&B, COVID)  RVPGX2
Influenza A by PCR: NEGATIVE
Influenza B by PCR: NEGATIVE
Resp Syncytial Virus by PCR: NEGATIVE
SARS Coronavirus 2 by RT PCR: NEGATIVE

## 2023-11-25 LAB — LIPASE, BLOOD: Lipase: <10 U/L — ABNORMAL LOW (ref 11–51)

## 2023-11-25 LAB — LACTIC ACID, PLASMA
Lactic Acid, Venous: 1.4 mmol/L (ref 0.5–1.9)
Lactic Acid, Venous: 1.3 mmol/L (ref 0.5–1.9)

## 2023-11-25 LAB — MAGNESIUM: Magnesium: 1.9 mg/dL (ref 1.7–2.4)

## 2023-11-25 MED ORDER — AMLODIPINE BESYLATE 5 MG PO TABS
5.0000 mg | ORAL_TABLET | Freq: Every day | ORAL | Status: DC
Start: 2023-11-25 — End: 2023-11-27
  Administered 2023-11-25 – 2023-11-27 (×3): 5 mg via ORAL
  Filled 2023-11-25 (×3): qty 1

## 2023-11-25 MED ORDER — HYDROMORPHONE HCL 1 MG/ML IJ SOLN
1.0000 mg | Freq: Once | INTRAMUSCULAR | Status: AC
  Administered 2023-11-25: 1 mg via INTRAVENOUS
  Filled 2023-11-25: qty 1

## 2023-11-25 MED ORDER — CIPROFLOXACIN IN D5W 400 MG/200ML IV SOLN
400.0000 mg | Freq: Once | INTRAVENOUS | Status: AC
  Administered 2023-11-25: 400 mg via INTRAVENOUS
  Filled 2023-11-25: qty 200

## 2023-11-25 MED ORDER — POTASSIUM CHLORIDE 10 MEQ/100ML IV SOLN
10.0000 meq | INTRAVENOUS | Status: DC
  Administered 2023-11-25 – 2023-11-26 (×3): 10 meq via INTRAVENOUS
  Filled 2023-11-25 (×4): qty 100

## 2023-11-25 MED ORDER — CLONIDINE HCL 0.2 MG PO TABS
0.4000 mg | ORAL_TABLET | Freq: Two times a day (BID) | ORAL | Status: DC
  Administered 2023-11-25: 0.4 mg via ORAL
  Filled 2023-11-25: qty 2

## 2023-11-25 MED ORDER — METRONIDAZOLE 500 MG/100ML IV SOLN
500.0000 mg | Freq: Once | INTRAVENOUS | Status: AC
  Administered 2023-11-25: 500 mg via INTRAVENOUS
  Filled 2023-11-25: qty 100

## 2023-11-25 MED ORDER — PROMETHAZINE (PHENERGAN) 6.25MG IN NS 50ML IVPB
6.2500 mg | Freq: Once | INTRAVENOUS | Status: DC
  Filled 2023-11-25: qty 50

## 2023-11-25 MED ORDER — LACTATED RINGERS IV BOLUS
1000.0000 mL | Freq: Once | INTRAVENOUS | Status: AC
  Administered 2023-11-25: 1000 mL via INTRAVENOUS

## 2023-11-25 MED ORDER — SODIUM CHLORIDE 0.9 % IV SOLN: INTRAVENOUS | Status: AC

## 2023-11-25 MED ORDER — LABETALOL HCL 200 MG PO TABS
200.0000 mg | ORAL_TABLET | Freq: Two times a day (BID) | ORAL | Status: DC
  Administered 2023-11-25 – 2023-11-29 (×8): 200 mg via ORAL
  Filled 2023-11-25 (×8): qty 1

## 2023-11-25 MED ORDER — TETRACAINE HCL 0.5 % OP SOLN
1.0000 [drp] | Freq: Once | OPHTHALMIC | Status: AC
  Administered 2023-11-25: 1 [drp] via OPHTHALMIC
  Filled 2023-11-25: qty 4

## 2023-11-25 MED ORDER — IOHEXOL 350 MG/ML SOLN
100.0000 mL | Freq: Once | INTRAVENOUS | Status: AC | PRN
  Administered 2023-11-25: 100 mL via INTRAVENOUS

## 2023-11-25 MED ORDER — SODIUM CHLORIDE 0.9 % IV SOLN
Freq: Once | INTRAVENOUS | Status: AC
  Filled 2023-11-25: qty 50

## 2023-11-25 NOTE — ED Provider Notes (Signed)
 [place] EMERGENCY DEPARTMENT AT [facility] Provider Note   CSN: [id number] Arrival date & time: 11/25/23  8096     Patient presents with: Emesis   Renee Gutierrez is a 63 y.o. female.    Emesis Associated symptoms: abdominal pain and cough   Patient presents for multiple complaints.  Medical history includes migraines, IBS, HTN, HLD, GERD, fibromyalgia, anxiety, anemia.  She was admitted to [facility] a month ago for uncontrolled pain and hypokalemia.  Patient reports recent admission from [facility], where she was being treated for pneumonia, abdominal infection and hypokalemia.  She is unable to specify which abdominal infection.  She states that she was admitted for 4 nights.  She was discharged home 3 days ago.  She has had ongoing diffuse abdominal pain, nausea, vomiting, p.o. intolerance, and left facial pain.  She states history of trigeminal neuralgia.  She also endorses left eye pain.  She states that she takes Dilaudid , Zofran , and Phenergan  at home.  She returned to [facility] ER for ongoing symptoms.  She states that she was given a shot of an antiemetic and sent home.  She presents to the ED here for persistent symptoms.  States that her stools have been soft and nonbloody.     Prior to Admission medications   Medication Sig Start Date End Date Taking? Authorizing Provider  amLODipine  (NORVASC ) 5 MG tablet Take 5 mg by mouth daily. 11/24/23  Yes [provider]  aspirin -acetaminophen -caffeine  (EXCEDRIN  MIGRAINE) 250-250-65 MG tablet Take 2 tablets by mouth daily as needed for migraine or headache. 05/24/14  Yes [provider]  cefdinir (OMNICEF) 300 MG capsule Take 300 mg by mouth 2 (two) times daily. 11/24/23  Yes [provider]  diclofenac Sodium (VOLTAREN) 1 % GEL Apply 2 g topically 4 (four) times daily. 04/18/20  Yes [provider]  DULoxetine  (CYMBALTA ) 60 MG capsule Take 60 mg by mouth 2 (two) times daily. 05/16/20   Yes [provider]  erythromycin  ophthalmic ointment Place 1 Application into the left eye 3 (three) times daily.   Yes [provider]  labetalol  (NORMODYNE ) 200 MG tablet Take 200 mg by mouth 2 (two) times daily.   Yes [provider]  lisinopril  (PRINIVIL ,ZESTRIL ) 40 MG tablet Take 40 mg by mouth daily. 01/31/14 11/25/23 Yes [provider]  metroNIDAZOLE (FLAGYL) 250 MG tablet Take 500 mg by mouth 3 (three) times daily. 11/24/23  Yes [provider]  morphine  (MSIR) 15 MG tablet Take 15 mg by mouth every 4 (four) hours as needed for moderate pain (pain score 4-6). 11/05/23  Yes [provider]  mupirocin ointment (BACTROBAN) 2 % Apply 1 Application topically 3 (three) times daily. 03/25/16  Yes [provider]  naloxone (NARCAN) nasal spray 4 mg/0.1 mL Place 1 spray into the nose once. 09/17/18  Yes [provider]  ondansetron  (ZOFRAN -ODT) 8 MG disintegrating tablet Take 8 mg by mouth every 6 (six) hours as needed for nausea or vomiting. 11/24/23  Yes [provider]  orphenadrine  (NORFLEX ) 100 MG tablet Take 100 mg by mouth at bedtime. 10/06/19  Yes [provider]  pantoprazole  (PROTONIX ) 40 MG tablet Take 1 tablet (40 mg total) by mouth daily. 11/03/23  Yes Johnson, Clanford L, MD  prednisoLONE  acetate (PRED FORTE ) 1 % ophthalmic suspension Place 1 drop into the left eye in the morning and at bedtime. 03/01/20  Yes [provider]  rOPINIRole  (REQUIP ) 2 MG tablet Take 2-4 mg by mouth  See admin instructions. 2 mg am, 4 mg pm 11/11/14  Yes [provider]  simethicone (GAS RELIEF EXTRA STRENGTH) 125 MG chewable tablet Chew 125 mg by mouth daily as needed for flatulence.  05/24/14  Yes [provider]  Vitamin D , Ergocalciferol , (DRISDOL ) 1.25 MG (50000 UNIT) CAPS capsule Take 50,000 Units by mouth every Saturday. 05/10/23  Yes [provider]    Allergies: Buprenorphine hcl,  Bupropion, Codeine, Sulfamethoxazole-trimethoprim, Nsaids, Pregabalin , Sulfa antibiotics, Tapentadol, Clindamycin, Clindamycin/lincomycin, Cymbalta  [duloxetine  hcl], Diclofenac, Diclofenac sodium, Gabapentin, Oxycodone , and Toradol  [ketorolac  tromethamine ]    Review of Systems  Constitutional:  Positive for activity change and appetite change.  Eyes:  Positive for pain.  Respiratory:  Positive for cough and shortness of breath.   Gastrointestinal:  Positive for abdominal pain, nausea and vomiting.  All other systems reviewed and are negative.   Updated Vital Signs BP (!) 141/77   Pulse 79   Temp 100.2 F (37.9 C) (Oral)   Resp 17   Wt 41.1 kg   SpO2 100%   BMI 18.30 kg/m   Physical Exam Vitals and nursing note reviewed.  Constitutional:      General: She is not in acute distress.    Appearance: Normal appearance. She is well-developed. She is not ill-appearing, toxic-appearing or diaphoretic.  HENT:     Head: Normocephalic and atraumatic.     Right Ear: External ear normal.     Left Ear: External ear normal.     Nose: Nose normal.     Mouth/Throat:     Mouth: Mucous membranes are moist.  Eyes:     Extraocular Movements: Extraocular movements intact.     Conjunctiva/sclera: Conjunctivae normal.     Comments: Left cornea is cloudy.  Cardiovascular:     Rate and Rhythm: Normal rate and regular rhythm.     Heart sounds: No murmur heard. Pulmonary:     Effort: Pulmonary effort is normal. No respiratory distress.     Breath sounds: Normal breath sounds. No wheezing or rales.  Abdominal:     General: There is no distension.     Palpations: Abdomen is soft.     Tenderness: There is abdominal tenderness. There is no guarding or rebound.  Musculoskeletal:        General: No swelling.     Cervical back: Normal range of motion and neck supple.     Right lower leg: No edema.     Left lower leg: No edema.  Skin:    General: Skin is warm and dry.     Coloration: Skin is not  jaundiced or pale.  Neurological:     General: No focal deficit present.     Mental Status: She is alert and oriented to person, place, and time.  Psychiatric:        Mood and Affect: Mood normal.        Behavior: Behavior normal.     (all labs ordered are listed, but only abnormal results are displayed) Labs Reviewed  LIPASE, BLOOD - Abnormal; Notable for the following components:      Result Value   Lipase <10 (*)    All other components within normal limits  COMPREHENSIVE METABOLIC PANEL WITH GFR - Abnormal; Notable for the following components:   Potassium 2.8 (*)    BUN 7 (*)    Total Protein 6.3 (*)    Anion gap 17 (*)    All other components within normal limits  CBC - Abnormal; Notable for  the following components:   WBC 10.8 (*)    RBC 3.24 (*)    Hemoglobin 8.8 (*)    HCT 28.5 (*)    Platelets 712 (*)    All other components within normal limits  URINALYSIS, W/ REFLEX TO CULTURE (INFECTION SUSPECTED) - Abnormal; Notable for the following components:   Color, Urine STRAW (*)    Ketones, ur 20 (*)    All other components within normal limits  RESP PANEL BY RT-PCR (RSV, FLU A&B, COVID)  [id number]  CULTURE, BLOOD (ROUTINE X 2)  CULTURE, BLOOD (ROUTINE X 2)  LACTIC ACID, PLASMA  LACTIC ACID, PLASMA  MAGNESIUM     EKG: EKG Interpretation Date/Time:  Tuesday November 25 2023 20:20:39 EDT Ventricular Rate:  81 PR Interval:  132 QRS Duration:  84 QT Interval:  443 QTC Calculation: 515 R Axis:   15  Text Interpretation: Sinus rhythm Probable left atrial enlargement Prolonged QT interval Confirmed by Melvenia Motto [phone]) on 11/25/2023 8:59:38 PM  Radiology: CT Angio Chest PE W and/or Wo Contrast Result Date: 11/25/2023 CLINICAL DATA:  Pulmonary embolus suspected with high probability. Recent discharge from [facility] for low potassium, pneumonia, and bacterial infection of GI tract. Acute nonlocalized abdominal pain. EXAM: CT ANGIOGRAPHY CHEST CT ABDOMEN AND PELVIS  WITH CONTRAST TECHNIQUE: Multidetector CT imaging of the chest was performed using the standard protocol during bolus administration of intravenous contrast. Multiplanar CT image reconstructions and MIPs were obtained to evaluate the vascular anatomy. Multidetector CT imaging of the abdomen and pelvis was performed using the standard protocol during bolus administration of intravenous contrast. RADIATION DOSE REDUCTION: This exam was performed according to the departmental dose-optimization program which includes automated exposure control, adjustment of the mA and/or kV according to patient size and/or use of iterative reconstruction technique. CONTRAST:  OMNIPAQUE  IOHEXOL  350 MG/ML SOLN COMPARISON:  Chest radiograph 11/25/2023. CT abdomen and pelvis 09/20/2023. CT chest 07/05/2011 FINDINGS: CTA CHEST FINDINGS Cardiovascular: Technically adequate study with good opacification of the central and segmental pulmonary arteries. Moderate motion artifact. No focal filling defects. No evidence of significant pulmonary embolus. Cardiac enlargement. Small pericardial effusion. Normal caliber thoracic aorta. No aortic dissection. Great vessel origins are patent. Calcification of the aorta and coronary arteries. Mediastinum/Nodes: Large esophageal hiatal hernia. Esophagus is decompressed. No significant lymphadenopathy. Thyroid gland is unremarkable. Lungs/Pleura: Trace left pleural effusion. Atelectasis in the left base. No pneumothorax. Musculoskeletal: Postoperative changes in the cervical spine. Degenerative changes in the thoracic spine. No acute bony abnormalities. Review of the MIP images confirms the above findings. CT ABDOMEN and PELVIS FINDINGS Hepatobiliary: No focal liver abnormality is seen. Status post cholecystectomy. No biliary dilatation. Pancreas: Unremarkable. No pancreatic ductal dilatation or surrounding inflammatory changes. Spleen: Normal in size without focal abnormality. Adrenals/Urinary Tract:  Adrenal glands are unremarkable. Kidneys are normal, without renal calculi, focal lesion, or hydronephrosis. Bladder is unremarkable. Stomach/Bowel: Stomach, small bowel, and colon are not abnormally distended. Under distention limits evaluation but there is evidence of wall thickening in the rectosigmoid region which likely indicates colitis. This may represent infectious or inflammatory etiologies. No abscess identified. Appendix is not visualized. Vascular/Lymphatic: Aortic atherosclerosis. No enlarged abdominal or pelvic lymph nodes. Reproductive: Status post hysterectomy. No adnexal masses. Other: No free air or free fluid in the abdomen. Abdominal wall musculature appears intact. Edema in the subcutaneous fat. Musculoskeletal: Lumbar scoliosis convex towards the left. Degenerative changes in the lumbar spine. No acute bony abnormalities. Review of the MIP images confirms the above findings. IMPRESSION: 1.  No evidence of significant pulmonary embolus. 2. Large esophageal hiatal hernia. 3. Small left pleural effusion with basilar atelectasis. 4. Aortic atherosclerosis. 5. Wall thickening in the rectosigmoid colon suggesting colitis. No abscess. No proximal obstruction. Electronically Signed   By: Elsie Gravely M.D.   On: 11/25/2023 22:21   CT ABDOMEN PELVIS W CONTRAST Result Date: 11/25/2023 CLINICAL DATA:  Pulmonary embolus suspected with high probability. Recent discharge from [facility] for low potassium, pneumonia, and bacterial infection of GI tract. Acute nonlocalized abdominal pain. EXAM: CT ANGIOGRAPHY CHEST CT ABDOMEN AND PELVIS WITH CONTRAST TECHNIQUE: Multidetector CT imaging of the chest was performed using the standard protocol during bolus administration of intravenous contrast. Multiplanar CT image reconstructions and MIPs were obtained to evaluate the vascular anatomy. Multidetector CT imaging of the abdomen and pelvis was performed using the standard protocol during bolus  administration of intravenous contrast. RADIATION DOSE REDUCTION: This exam was performed according to the departmental dose-optimization program which includes automated exposure control, adjustment of the mA and/or kV according to patient size and/or use of iterative reconstruction technique. CONTRAST:  OMNIPAQUE  IOHEXOL  350 MG/ML SOLN COMPARISON:  Chest radiograph 11/25/2023. CT abdomen and pelvis 09/20/2023. CT chest 07/05/2011 FINDINGS: CTA CHEST FINDINGS Cardiovascular: Technically adequate study with good opacification of the central and segmental pulmonary arteries. Moderate motion artifact. No focal filling defects. No evidence of significant pulmonary embolus. Cardiac enlargement. Small pericardial effusion. Normal caliber thoracic aorta. No aortic dissection. Great vessel origins are patent. Calcification of the aorta and coronary arteries. Mediastinum/Nodes: Large esophageal hiatal hernia. Esophagus is decompressed. No significant lymphadenopathy. Thyroid gland is unremarkable. Lungs/Pleura: Trace left pleural effusion. Atelectasis in the left base. No pneumothorax. Musculoskeletal: Postoperative changes in the cervical spine. Degenerative changes in the thoracic spine. No acute bony abnormalities. Review of the MIP images confirms the above findings. CT ABDOMEN and PELVIS FINDINGS Hepatobiliary: No focal liver abnormality is seen. Status post cholecystectomy. No biliary dilatation. Pancreas: Unremarkable. No pancreatic ductal dilatation or surrounding inflammatory changes. Spleen: Normal in size without focal abnormality. Adrenals/Urinary Tract: Adrenal glands are unremarkable. Kidneys are normal, without renal calculi, focal lesion, or hydronephrosis. Bladder is unremarkable. Stomach/Bowel: Stomach, small bowel, and colon are not abnormally distended. Under distention limits evaluation but there is evidence of wall thickening in the rectosigmoid region which likely indicates colitis. This may  represent infectious or inflammatory etiologies. No abscess identified. Appendix is not visualized. Vascular/Lymphatic: Aortic atherosclerosis. No enlarged abdominal or pelvic lymph nodes. Reproductive: Status post hysterectomy. No adnexal masses. Other: No free air or free fluid in the abdomen. Abdominal wall musculature appears intact. Edema in the subcutaneous fat. Musculoskeletal: Lumbar scoliosis convex towards the left. Degenerative changes in the lumbar spine. No acute bony abnormalities. Review of the MIP images confirms the above findings. IMPRESSION: 1. No evidence of significant pulmonary embolus. 2. Large esophageal hiatal hernia. 3. Small left pleural effusion with basilar atelectasis. 4. Aortic atherosclerosis. 5. Wall thickening in the rectosigmoid colon suggesting colitis. No abscess. No proximal obstruction. Electronically Signed   By: Elsie Gravely M.D.   On: 11/25/2023 22:21   DG Chest Port 1 View Result Date: 11/25/2023 EXAM: 1 VIEW(S) XRAY OF THE CHEST 11/25/2023 08:15:00 PM COMPARISON: 07/06/2023 CLINICAL HISTORY: Questionable sepsis - evaluate for abnormality. FINDINGS: LUNGS AND PLEURA: No focal pulmonary opacity. No pulmonary edema. No pleural effusion. No pneumothorax. HEART AND MEDIASTINUM: Normal heart size and mediastinal contours. Atherosclerotic calcifications. BONES AND SOFT TISSUES: Cervical fusion hardware is noted. No acute osseous abnormality. DIAPHRAGM AND UPPER  ABDOMEN: A small hiatal hernia is present. IMPRESSION: 1. No acute abnormalities. Electronically signed by: Andrea Gasman MD 11/25/2023 08:25 PM EDT RP Workstation: [id number]     Procedures   Medications Ordered in the ED  tetracaine  (PONTOCAINE) 0.5 % ophthalmic solution 1 drop (has no administration in time range)  amLODipine  (NORVASC ) tablet 5 mg (5 mg Oral Given 11/25/23 2132)  cloNIDine  (CATAPRES ) tablet 0.4 mg (0.4 mg Oral Given 11/25/23 2128)  labetalol  (NORMODYNE ) tablet 200 mg (200 mg Oral Given  11/25/23 2129)  potassium chloride  10 mEq in 100 mL IVPB (10 mEq Intravenous New Bag/Given 11/25/23 2221)  0.9 %  sodium chloride  infusion ( Intravenous New Bag/Given 11/25/23 2221)  ciprofloxacin (CIPRO) IVPB 400 mg (has no administration in time range)  metroNIDAZOLE (FLAGYL) IVPB 500 mg (has no administration in time range)  HYDROmorphone  (DILAUDID ) injection 1 mg (1 mg Intravenous Given 11/25/23 2129)  lactated ringers  bolus 1,000 mL (1,000 mLs Intravenous New Bag/Given 11/25/23 2128)  iohexol  (OMNIPAQUE ) 350 MG/ML injection 100 mL (100 mLs Intravenous Contrast Given 11/25/23 2155)  sodium chloride  0.9 % 50 mL with promethazine  (PHENERGAN ) 6.25 mg infusion ( Intravenous New Bag/Given 11/25/23 2234)                                    Medical Decision Making Amount and/or Complexity of Data Reviewed Labs: ordered. Radiology: ordered.  Risk Prescription drug management.   This patient presents to the ED for concern of multiple complaints, this involves an extensive number of treatment options, and is a complaint that carries with it a high risk of complications and morbidity.  The differential diagnosis includes infection, metabolic derangements, dehydration, deconditioning, opiate withdrawal   Co morbidities / Chronic conditions that complicate the patient evaluation  migraines, IBS, HTN, HLD, GERD, fibromyalgia, anxiety, anemia   Additional history obtained:  Additional history obtained from EMR External records from outside source obtained and reviewed including N/A   Lab Tests:  I Ordered, and personally interpreted labs.  The pertinent results include: Normal kidney function.  Hypokalemia is present with otherwise normal electrolytes.  Anemia appears to be baseline.  Mild leukocytosis is present.   Imaging Studies ordered:  I ordered imaging studies including chest x-ray, CTA chest, CT of abdomen and pelvis I independently visualized and interpreted imaging which showed  findings concerning for colitis I agree with the radiologist interpretation   Cardiac Monitoring: / EKG:  The patient was maintained on a cardiac monitor.  I personally viewed and interpreted the cardiac monitored which showed an underlying rhythm of: Sinus rhythm   Problem List / ED Course / Critical interventions / Medication management  Patient presenting for multiple complaints.  Vital signs on arrival notable for low-grade temperature, tachycardia, and hypertension.  On exam, she is resting in right lateral decubitus position.  She is holding her abdomen and closing her left eye.  She appears uncomfortable.  She endorses ongoing severe pain and nausea.  Her abdomen is soft but she does endorse a generalized tenderness.  She attributes her facial pain to trigeminal neuralgia.  She also endorses left eye pain.  On inspection of left eye, she does have a cloudy cornea and esotropia.  Patient does state that this is chronic.  She also has chronic blurry vision from this left eye.  She attributes this to which she describes as prior brain surgery for AVM.  Her current breathing is unlabored.  Her lungs are clear to auscultation.  IV fluids were ordered for hydration, Dilaudid  was ordered for analgesia, Phenergan  ordered for nausea.  Home blood pressure medications were ordered.  She is prescribed clonidine .  Elevated blood pressure may be secondary to rebound hypertension from her recent p.o. intolerance.  Workup was initiated.  Lab work was notable for hypokalemia.  Replacement potassium was ordered.  Imaging studies showed findings consistent with colitis.  Ciprofloxacin and Flagyl were ordered.  IOP of left eye was normal.  On reassessment, patient's pain and nausea have improved.  Plan will be for admission. I ordered medication including IV fluid for hydration, Dilaudid  for analgesia, Phenergan  for nausea, potassium chloride  for hypokalemia, clonidine , labetalol , amlodipine  for hypertension,  ciprofloxacin and Flagyl for colitis Reevaluation of the patient after these medicines showed that the patient improved I have reviewed the patients home medicines and have made adjustments as needed   Social Determinants of Health:  Lives at home with family     Final diagnoses:  Nausea and vomiting, unspecified vomiting type  Colitis  Hypokalemia    ED Discharge Orders     None          Melvenia Motto, MD 11/25/23 2302

## 2023-11-25 NOTE — ED Triage Notes (Signed)
 Pt reports she was discharged from [facility] for low potassium, pna, and a bacterial infection of her GI tact.  Pt reports she was discharged too soon and went back to that ER this morning and they still sent her home.

## 2023-11-25 NOTE — H&P (Incomplete)
 History and Physical    Patient: Renee Gutierrez FMW:[id number] DOB: 1960/04/26 DOA: 11/25/2023 DOS: the patient was seen and examined on 11/26/2023 PCP: Patient, No Pcp Per  Patient coming from: Home  Chief Complaint:  Chief Complaint  Patient presents with   Emesis   HPI: Renee Gutierrez is a 63 y.o. female with medical history significant of hypertension, hyperlipidemia, anxiety, GERD, chronic pain syndrome, trigeminal neuralgia who presents to the emergency department due to nausea, vomiting, abdominal pain and decreased oral intake.  She states that she was recently admitted to [facility] for 4 days and was treated for pneumonia, hypokalemia and abdominal infection.  She was discharged about 3 days ago, but has continued to complain of symptoms.  She also complained of left facial pain which she states was due to history of trigeminal neuralgia.  She went back to [facility] ED today and she was only given antiemetic shot and discharged home, so she decided to go to the ED due to persistence of symptoms.  She endorsed normal bowel movement with last BM being today.  She was recently admitted here at [facility] from 9/13 to 09/15 due to hypokalemia which was thought to be related related to vomiting.  ED Course:  In the emergency department, BP was 201/110, pulse 101 beats per minute, temperature 100.2 F, respiratory rate 20/min and O2 sat 100% on room air.  Workup in ED showed CBC with WBC of 10.8, hemoglobin 8.8, hematocrit 28.5, MCV 88.0, platelets 712.  BMP was normal except for potassium of 2.8, anion gap 17, lipase less than 10, lactic acid was normal 1.2-1.9.  Influenza A, B, SARS-CoV-2, RSV was negative. CT angiography of chest showed no evidence of significant pulmonary embolus CT abdomen and pelvis with contrast showed large esophageal hiatal hernia, small left pleural effusion with basilar atelectasis and colitis with no abscess and no proximal obstruction. Patient was treated with  IV ciprofloxacin and metronidazole, IV hydration was provided, potassium was replenished, Phenergan  was given.S[facility]  [facility] was asked to admit patient  Review of Systems: Review of systems as noted in the HPI. All other systems reviewed and are negative.   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 She has never used smokeless tobacco. She reports that she does not drink alcohol and does not use drugs.   Allergies  Allergen Reactions   Buprenorphine Hcl Swelling    All extremities and neck  and face swelled up huge according to patient   Bupropion Swelling and Other (See Comments)    Moody and depressed   Codeine Itching, Nausea And Vomiting, Nausea Only and Swelling   Sulfamethoxazole-Trimethoprim Swelling and Other (See Comments)    Swelled up her eye   Nsaids Other (See Comments)    Unknown    Pregabalin  Swelling    Lyrica     Sulfa Antibiotics Other (See Comments)    Unknown    Tapentadol Nausea And Vomiting   Clindamycin Other (See Comments)    Heart burn   Clindamycin/Lincomycin Rash and Other (See Comments)    Heart burn   Cymbalta  [Duloxetine  Hcl] Swelling    Diclofenac Swelling    Cream    Diclofenac Sodium Swelling    Oral med   Gabapentin Swelling   Oxycodone  Nausea Only    No longer has a reaction per pt   Toradol  [Ketorolac  Tromethamine ] Rash    History reviewed. No pertinent family history.    Prior to Admission medications   Medication Sig Start Date End Date Taking? Authorizing Provider  amLODipine  (NORVASC ) 5 MG tablet Take 5 mg by mouth daily. 11/24/23  Yes [provider]  aspirin -acetaminophen -caffeine  (EXCEDRIN  MIGRAINE) 250-250-65 MG tablet Take 2 tablets by mouth daily as needed for migraine or headache. 05/24/14  Yes [provider]  cefdinir (OMNICEF) 300 MG capsule Take 300 mg by mouth 2 (two) times daily. 11/24/23  Yes [provider]  diclofenac Sodium (VOLTAREN) 1 % GEL Apply 2 g topically 4 (four) times daily. 04/18/20  Yes [provider]  DULoxetine  (CYMBALTA ) 60 MG capsule Take 60 mg by mouth 2 (two) times daily. 05/16/20  Yes [provider]  erythromycin  ophthalmic ointment Place 1 Application into the left eye 3 (three) times daily.   Yes [provider]  labetalol  (NORMODYNE ) 200 MG tablet Take 200 mg by mouth 2 (two) times daily.   Yes [provider]  lisinopril  (PRINIVIL ,ZESTRIL ) 40 MG tablet Take 40 mg by mouth daily. 01/31/14 11/25/23 Yes [provider]  metroNIDAZOLE (FLAGYL) 250 MG tablet Take 500 mg by mouth 3 (three) times daily. 11/24/23  Yes [provider]  morphine  (MSIR) 15 MG tablet Take 15 mg by mouth every 4 (four) hours as needed for moderate pain (pain score 4-6). 11/05/23  Yes [provider]  mupirocin ointment (BACTROBAN) 2 % Apply 1 Application topically 3 (three) times daily. 03/25/16  Yes [provider]  naloxone (NARCAN) nasal spray 4 mg/0.1 mL Place 1 spray into the nose once. 09/17/18  Yes [provider]  ondansetron  (ZOFRAN -ODT) 8 MG disintegrating tablet Take 8 mg by mouth every 6 (six)  hours as needed for nausea or vomiting. 11/24/23  Yes [provider]  orphenadrine  (NORFLEX ) 100 MG tablet Take 100 mg by mouth at bedtime. 10/06/19  Yes [provider]  pantoprazole  (PROTONIX ) 40 MG tablet Take 1 tablet (40 mg total) by mouth daily. 11/03/23  Yes Johnson, Clanford L, MD  prednisoLONE  acetate (PRED FORTE ) 1 % ophthalmic suspension Place 1 drop into the left eye in the morning and at bedtime. 03/01/20  Yes [provider]  rOPINIRole  (REQUIP ) 2 MG tablet Take 2-4 mg by mouth See admin instructions. 2 mg am, 4 mg pm 11/11/14  Yes [provider]  simethicone (GAS RELIEF EXTRA STRENGTH) 125 MG chewable tablet Chew 125 mg by mouth daily as needed for flatulence.  05/24/14  Yes [provider]  Vitamin D , Ergocalciferol , (  DRISDOL ) 1.25 MG (50000 UNIT) CAPS capsule Take 50,000 Units by mouth every Saturday. 05/10/23  Yes [provider]    Physical Exam: BP (!) 146/68 (BP Location: Right Arm)   Pulse 73   Temp 98.5 F (36.9 C) (Oral)   Resp 18   Wt 41.1 kg   SpO2 99%   BMI 18.30 kg/m   General: 63 y.o. year-old female well developed well nourished in no acute distress.  Alert and oriented x3. HEENT: NCAT, EOMI Neck: Supple, trachea medial Cardiovascular: Regular rate and rhythm with no rubs or gallops.  No thyromegaly or JVD noted.  No lower extremity edema. 2/4 pulses in all 4 extremities. Respiratory: Clear to auscultation with no wheezes or rales. Good inspiratory effort. Abdomen: Soft, tender to palpation without guarding.  Nondistended with normal bowel sounds x4 quadrants. Muskuloskeletal: No cyanosis, clubbing or edema noted bilaterally Neuro: CN II-XII intact, strength 5/5 x 4, sensation, reflexes intact Skin: No ulcerative lesions noted or rashes Psychiatry: Judgement and insight appear normal. Mood is appropriate for condition and setting          Labs on Admission:  Basic Metabolic Panel: Recent Labs  Lab  11/25/23 2008  NA 140  K 2.8*  CL 101  CO2 23  GLUCOSE 99  BUN 7*  CREATININE 0.79  CALCIUM  9.2  MG 1.9   Liver Function Tests: Recent Labs  Lab 11/25/23 2008  AST 20  ALT 9  ALKPHOS 79  BILITOT <0.2  PROT 6.3*  ALBUMIN 3.5   Recent Labs  Lab 11/25/23 2008  LIPASE <10*   No results for input(s): AMMONIA in the last 168 hours. CBC: Recent Labs  Lab 11/25/23 2008  WBC 10.8*  HGB 8.8*  HCT 28.5*  MCV 88.0  PLT 712*   Cardiac Enzymes: No results for input(s): CKTOTAL, CKMB, CKMBINDEX, TROPONINI in the last 168 hours.  BNP (last 3 results) Recent Labs    11/12/23 1200  BNP 132.0*    ProBNP (last 3 results) No results for input(s): PROBNP in the last 8760 hours.  CBG: No results for input(s): GLUCAP in the last 168 hours.  Radiological Exams on Admission: CT Angio Chest PE W and/or Wo Contrast Result Date: 11/25/2023 CLINICAL DATA:  Pulmonary embolus suspected with high probability. Recent discharge from [facility] for low potassium, pneumonia, and bacterial infection of GI tract. Acute nonlocalized abdominal pain. EXAM: CT ANGIOGRAPHY CHEST CT ABDOMEN AND PELVIS WITH CONTRAST TECHNIQUE: Multidetector CT imaging of the chest was performed using the standard protocol during bolus administration of intravenous contrast. Multiplanar CT image reconstructions and MIPs were obtained to evaluate the vascular anatomy. Multidetector CT imaging of the abdomen and pelvis was performed using the standard protocol during bolus administration of intravenous contrast. RADIATION DOSE REDUCTION: This exam was performed according to the departmental dose-optimization program which includes automated exposure control, adjustment of the mA and/or kV according to patient size and/or use of iterative reconstruction technique. CONTRAST:  OMNIPAQUE  IOHEXOL  350 MG/ML SOLN COMPARISON:  Chest radiograph 11/25/2023. CT abdomen and pelvis 09/20/2023. CT chest  07/05/2011 FINDINGS: CTA CHEST FINDINGS Cardiovascular: Technically adequate study with good opacification of the central and segmental pulmonary arteries. Moderate motion artifact. No focal filling defects. No evidence of significant pulmonary embolus. Cardiac enlargement. Small pericardial effusion. Normal caliber thoracic aorta. No aortic dissection. Great vessel origins are patent. Calcification of the aorta and coronary arteries. Mediastinum/Nodes: Large esophageal hiatal hernia. Esophagus is decompressed. No significant lymphadenopathy. Thyroid gland is unremarkable. Lungs/Pleura:  Trace left pleural effusion. Atelectasis in the left base. No pneumothorax. Musculoskeletal: Postoperative changes in the cervical spine. Degenerative changes in the thoracic spine. No acute bony abnormalities. Review of the MIP images confirms the above findings. CT ABDOMEN and PELVIS FINDINGS Hepatobiliary: No focal liver abnormality is seen. Status post cholecystectomy. No biliary dilatation. Pancreas: Unremarkable. No pancreatic ductal dilatation or surrounding inflammatory changes. Spleen: Normal in size without focal abnormality. Adrenals/Urinary Tract: Adrenal glands are unremarkable. Kidneys are normal, without renal calculi, focal lesion, or hydronephrosis. Bladder is unremarkable. Stomach/Bowel: Stomach, small bowel, and colon are not abnormally distended. Under distention limits evaluation but there is evidence of wall thickening in the rectosigmoid region which likely indicates colitis. This may represent infectious or inflammatory etiologies. No abscess identified. Appendix is not visualized. Vascular/Lymphatic: Aortic atherosclerosis. No enlarged abdominal or pelvic lymph nodes. Reproductive: Status post hysterectomy. No adnexal masses. Other: No free air or free fluid in the abdomen. Abdominal wall musculature appears intact. Edema in the subcutaneous fat. Musculoskeletal: Lumbar scoliosis convex towards the left.  Degenerative changes in the lumbar spine. No acute bony abnormalities. Review of the MIP images confirms the above findings. IMPRESSION: 1. No evidence of significant pulmonary embolus. 2. Large esophageal hiatal hernia. 3. Small left pleural effusion with basilar atelectasis. 4. Aortic atherosclerosis. 5. Wall thickening in the rectosigmoid colon suggesting colitis. No abscess. No proximal obstruction. Electronically Signed   By: Elsie Gravely M.D.   On: 11/25/2023 22:21   CT ABDOMEN PELVIS W CONTRAST Result Date: 11/25/2023 CLINICAL DATA:  Pulmonary embolus suspected with high probability. Recent discharge from [facility] for low potassium, pneumonia, and bacterial infection of GI tract. Acute nonlocalized abdominal pain. EXAM: CT ANGIOGRAPHY CHEST CT ABDOMEN AND PELVIS WITH CONTRAST TECHNIQUE: Multidetector CT imaging of the chest was performed using the standard protocol during bolus administration of intravenous contrast. Multiplanar CT image reconstructions and MIPs were obtained to evaluate the vascular anatomy. Multidetector CT imaging of the abdomen and pelvis was performed using the standard protocol during bolus administration of intravenous contrast. RADIATION DOSE REDUCTION: This exam was performed according to the departmental dose-optimization program which includes automated exposure control, adjustment of the mA and/or kV according to patient size and/or use of iterative reconstruction technique. CONTRAST:  OMNIPAQUE  IOHEXOL  350 MG/ML SOLN COMPARISON:  Chest radiograph 11/25/2023. CT abdomen and pelvis 09/20/2023. CT chest 07/05/2011 FINDINGS: CTA CHEST FINDINGS Cardiovascular: Technically adequate study with good opacification of the central and segmental pulmonary arteries. Moderate motion artifact. No focal filling defects. No evidence of significant pulmonary embolus. Cardiac enlargement. Small pericardial effusion. Normal caliber thoracic aorta. No aortic dissection. Great  vessel origins are patent. Calcification of the aorta and coronary arteries. Mediastinum/Nodes: Large esophageal hiatal hernia. Esophagus is decompressed. No significant lymphadenopathy. Thyroid gland is unremarkable. Lungs/Pleura: Trace left pleural effusion. Atelectasis in the left base. No pneumothorax. Musculoskeletal: Postoperative changes in the cervical spine. Degenerative changes in the thoracic spine. No acute bony abnormalities. Review of the MIP images confirms the above findings. CT ABDOMEN and PELVIS FINDINGS Hepatobiliary: No focal liver abnormality is seen. Status post cholecystectomy. No biliary dilatation. Pancreas: Unremarkable. No pancreatic ductal dilatation or surrounding inflammatory changes. Spleen: Normal in size without focal abnormality. Adrenals/Urinary Tract: Adrenal glands are unremarkable. Kidneys are normal, without renal calculi, focal lesion, or hydronephrosis. Bladder is unremarkable. Stomach/Bowel: Stomach, small bowel, and colon are not abnormally distended. Under distention limits evaluation but there is evidence of wall thickening in the rectosigmoid region which likely indicates colitis. This may represent  infectious or inflammatory etiologies. No abscess identified. Appendix is not visualized. Vascular/Lymphatic: Aortic atherosclerosis. No enlarged abdominal or pelvic lymph nodes. Reproductive: Status post hysterectomy. No adnexal masses. Other: No free air or free fluid in the abdomen. Abdominal wall musculature appears intact. Edema in the subcutaneous fat. Musculoskeletal: Lumbar scoliosis convex towards the left. Degenerative changes in the lumbar spine. No acute bony abnormalities. Review of the MIP images confirms the above findings. IMPRESSION: 1. No evidence of significant pulmonary embolus. 2. Large esophageal hiatal hernia. 3. Small left pleural effusion with basilar atelectasis. 4. Aortic atherosclerosis. 5. Wall thickening in the rectosigmoid colon suggesting  colitis. No abscess. No proximal obstruction. Electronically Signed   By: Elsie Gravely M.D.   On: 11/25/2023 22:21   DG Chest Port 1 View Result Date: 11/25/2023 EXAM: 1 VIEW(S) XRAY OF THE CHEST 11/25/2023 08:15:00 PM COMPARISON: 07/06/2023 CLINICAL HISTORY: Questionable sepsis - evaluate for abnormality. FINDINGS: LUNGS AND PLEURA: No focal pulmonary opacity. No pulmonary edema. No pleural effusion. No pneumothorax. HEART AND MEDIASTINUM: Normal heart size and mediastinal contours. Atherosclerotic calcifications. BONES AND SOFT TISSUES: Cervical fusion hardware is noted. No acute osseous abnormality. DIAPHRAGM AND UPPER ABDOMEN: A small hiatal hernia is present. IMPRESSION: 1. No acute abnormalities. Electronically signed by: Andrea Gasman MD 11/25/2023 08:25 PM EDT RP Workstation: [id number]    EKG: I independently viewed the EKG done and my findings are as followed: Normal sinus rhythm at a rate of 81 bpm with QTc of 515 ms  Assessment/Plan Present on Admission:  Hypokalemia  Nausea & vomiting  GERD (gastroesophageal reflux disease)  Restless legs syndrome  Trigeminal neuralgia  Principal Problem:   Hypokalemia Active Problems:   GERD (gastroesophageal reflux disease)   Restless legs syndrome   Generalized weakness   Nausea & vomiting   Trigeminal neuralgia   Colitis   Prolonged QT interval   Esophageal hiatal hernia   Essential hypertension  Hypokalemia K+ is 2.8 K+ will be replenished Please monitor for AM K+ for further replenishmemnt  Nausea and vomiting Continue Compazine  as needed  Colitis Continue IV hydration Patient was started on IV ciprofloxacin and Flagyl, we shall continue with IV Zosyn Continue IV Dilaudid  0.5 mg q.4h p.r.n. for moderate to severe pain Continue IV Compazine  p.r.n. Continue clear liquid diet with plan to advance diet as tolerated Obtain blood culture x2  Generalized weakness This is possibly due to vomiting and  hypokalemia Continue fall precaution Continue PT/OT eval and treat  Prolonged QT interval History 515 ms Avoid QT prolonging drugs Magnesium  level will be checked Repeat EKG in the morning  Esophageal hiatal hernia GERD Continue Protonix   Essential hypertension Continue amlodipine , labetalol   Restless leg syndrome Continue ropinirole   Trigeminal neuralgia  Dilaudid  as needed  DVT prophylaxis: Lovenox   Code Status: Full code  Family Communication: None at bedside  Consults: None  Severity of Illness: The appropriate patient status for this patient is INPATIENT. Inpatient status is judged to be reasonable and necessary in order to provide the required intensity of service to ensure the patient's safety. The patient's presenting symptoms, physical exam findings, and initial radiographic and laboratory data in the context of their chronic comorbidities is felt to place them at high risk for further clinical deterioration. Furthermore, it is not anticipated that the patient will be medically stable for discharge from the hospital within 2 midnights of admission.   * I certify that at the point of admission it is my clinical judgment that the patient will require inpatient hospital  care spanning beyond 2 midnights from the point of admission due to high intensity of service, high risk for further deterioration and high frequency of surveillance required.*  Author: Clydia Nieves, DO 11/26/2023 6:54 AM  For on call review [email or web address].

## 2023-11-26 DIAGNOSIS — K529 Noninfective gastroenteritis and colitis, unspecified: Secondary | ICD-10-CM | POA: Insufficient documentation

## 2023-11-26 DIAGNOSIS — G500 Trigeminal neuralgia: Secondary | ICD-10-CM

## 2023-11-26 DIAGNOSIS — G2581 Restless legs syndrome: Secondary | ICD-10-CM

## 2023-11-26 DIAGNOSIS — E876 Hypokalemia: Secondary | ICD-10-CM | POA: Diagnosis not present

## 2023-11-26 DIAGNOSIS — I10 Essential (primary) hypertension: Secondary | ICD-10-CM | POA: Insufficient documentation

## 2023-11-26 DIAGNOSIS — K449 Diaphragmatic hernia without obstruction or gangrene: Secondary | ICD-10-CM | POA: Diagnosis not present

## 2023-11-26 DIAGNOSIS — R9431 Abnormal electrocardiogram [ECG] [EKG]: Secondary | ICD-10-CM | POA: Insufficient documentation

## 2023-11-26 LAB — VITAMIN B12: Vitamin B-12: 245 pg/mL (ref 180–914)

## 2023-11-26 LAB — COMPREHENSIVE METABOLIC PANEL WITH GFR
ALT: 8 U/L (ref 0–44)
AST: 15 U/L (ref 15–41)
Albumin: 2.7 g/dL — ABNORMAL LOW (ref 3.5–5.0)
Alkaline Phosphatase: 60 U/L (ref 38–126)
Anion gap: 11 (ref 5–15)
BUN: <5 mg/dL — ABNORMAL LOW (ref 8–23)
CO2: 24 mmol/L (ref 22–32)
Calcium: 8.3 mg/dL — ABNORMAL LOW (ref 8.9–10.3)
Chloride: 103 mmol/L (ref 98–111)
Creatinine, Ser: 0.59 mg/dL (ref 0.44–1.00)
GFR, Estimated: >60 mL/min (ref >60)
Glucose, Bld: 77 mg/dL (ref 70–99)
Potassium: 3.2 mmol/L — ABNORMAL LOW (ref 3.5–5.1)
Sodium: 138 mmol/L (ref 135–145)
Total Bilirubin: <0.2 mg/dL (ref 0.0–1.2)
Total Protein: 4.8 g/dL — ABNORMAL LOW (ref 6.5–8.1)

## 2023-11-26 LAB — RETICULOCYTES
Immature Retic Fract: 33.3 % — ABNORMAL HIGH (ref 2.3–15.9)
RBC.: 2.83 MIL/uL — ABNORMAL LOW (ref 3.87–5.11)
Retic Count, Absolute: 69.6 K/uL (ref 19.0–186.0)
Retic Ct Pct: 2.5 % (ref 0.4–3.1)

## 2023-11-26 LAB — IRON AND TIBC
Iron: 20 ug/dL — ABNORMAL LOW (ref 28–170)
Saturation Ratios: 9 % — ABNORMAL LOW (ref 10.4–31.8)
TIBC: 230 ug/dL — ABNORMAL LOW (ref 250–450)
UIBC: 210 ug/dL

## 2023-11-26 LAB — CBC
HCT: 24.9 % — ABNORMAL LOW (ref 36.0–46.0)
Hemoglobin: 7.7 g/dL — ABNORMAL LOW (ref 12.0–15.0)
MCH: 27.6 pg (ref 26.0–34.0)
MCHC: 30.9 g/dL (ref 30.0–36.0)
MCV: 89.2 fL (ref 80.0–100.0)
Platelets: 537 K/uL — ABNORMAL HIGH (ref 150–400)
RBC: 2.79 MIL/uL — ABNORMAL LOW (ref 3.87–5.11)
RDW: 14.2 % (ref 11.5–15.5)
WBC: 11 K/uL — ABNORMAL HIGH (ref 4.0–10.5)
nRBC: 0 % (ref 0.0–0.2)

## 2023-11-26 LAB — FERRITIN: Ferritin: 70 ng/mL (ref 11–307)

## 2023-11-26 LAB — PHOSPHORUS: Phosphorus: 2 mg/dL — ABNORMAL LOW (ref 2.5–4.6)

## 2023-11-26 LAB — FOLATE: Folate: 14.1 ng/mL (ref >5.9)

## 2023-11-26 LAB — MAGNESIUM: Magnesium: 1.9 mg/dL (ref 1.7–2.4)

## 2023-11-26 MED ORDER — METOCLOPRAMIDE HCL 5 MG/ML IJ SOLN
5.0000 mg | Freq: Four times a day (QID) | INTRAMUSCULAR | Status: DC
  Administered 2023-11-26 – 2023-11-29 (×11): 5 mg via INTRAVENOUS
  Filled 2023-11-26 (×12): qty 2

## 2023-11-26 MED ORDER — HYDROMORPHONE HCL 1 MG/ML IJ SOLN
1.0000 mg | INTRAMUSCULAR | Status: DC | PRN
  Administered 2023-11-26 – 2023-11-28 (×9): 1 mg via INTRAVENOUS
  Filled 2023-11-26 (×9): qty 1

## 2023-11-26 MED ORDER — PANTOPRAZOLE SODIUM 40 MG IV SOLR
40.0000 mg | INTRAVENOUS | Status: DC
  Administered 2023-11-26 – 2023-11-28 (×3): 40 mg via INTRAVENOUS
  Filled 2023-11-26 (×3): qty 10

## 2023-11-26 MED ORDER — PIPERACILLIN-TAZOBACTAM 3.375 G IVPB
3.3750 g | Freq: Three times a day (TID) | INTRAVENOUS | Status: DC
  Administered 2023-11-26 – 2023-11-28 (×6): 3.375 g via INTRAVENOUS
  Filled 2023-11-26 (×7): qty 50

## 2023-11-26 MED ORDER — ROPINIROLE HCL 1 MG PO TABS
4.0000 mg | ORAL_TABLET | Freq: Every day | ORAL | Status: DC
Start: 2023-11-26 — End: 2023-11-29
  Administered 2023-11-26 – 2023-11-28 (×4): 4 mg via ORAL
  Filled 2023-11-26 (×5): qty 4

## 2023-11-26 MED ORDER — POTASSIUM CHLORIDE 20 MEQ PO PACK
40.0000 meq | PACK | Freq: Two times a day (BID) | ORAL | Status: DC
  Filled 2023-11-26: qty 2

## 2023-11-26 MED ORDER — ACETAMINOPHEN 650 MG RE SUPP: 650.0000 mg | Freq: Four times a day (QID) | RECTAL | Status: DC | PRN

## 2023-11-26 MED ORDER — ONDANSETRON HCL 4 MG PO TABS
4.0000 mg | ORAL_TABLET | Freq: Four times a day (QID) | ORAL | Status: DC | PRN
  Administered 2023-11-26: 4 mg via ORAL
  Filled 2023-11-26: qty 1

## 2023-11-26 MED ORDER — ACETAMINOPHEN 325 MG PO TABS
650.0000 mg | ORAL_TABLET | Freq: Four times a day (QID) | ORAL | Status: DC | PRN
  Administered 2023-11-27 – 2023-11-29 (×4): 650 mg via ORAL
  Filled 2023-11-26 (×4): qty 2

## 2023-11-26 MED ORDER — ENOXAPARIN SODIUM 30 MG/0.3ML IJ SOSY
30.0000 mg | PREFILLED_SYRINGE | INTRAMUSCULAR | Status: DC
  Administered 2023-11-27: 30 mg via SUBCUTANEOUS
  Filled 2023-11-26 (×2): qty 0.3

## 2023-11-26 MED ORDER — SODIUM CHLORIDE 0.9 % IV SOLN
25.0000 mg | Freq: Once | INTRAVENOUS | Status: AC
  Administered 2023-11-26: 25 mg via INTRAVENOUS
  Filled 2023-11-26: qty 1

## 2023-11-26 MED ORDER — POTASSIUM CHLORIDE 10 MEQ/100ML IV SOLN
10.0000 meq | INTRAVENOUS | Status: AC
  Administered 2023-11-26 (×3): 10 meq via INTRAVENOUS
  Filled 2023-11-26 (×2): qty 100

## 2023-11-26 MED ORDER — POTASSIUM PHOSPHATES 15 MMOLE/5ML IV SOLN
30.0000 mmol | Freq: Once | INTRAVENOUS | Status: AC
  Administered 2023-11-26: 30 mmol via INTRAVENOUS
  Filled 2023-11-26: qty 10

## 2023-11-26 MED ORDER — ROPINIROLE HCL 1 MG PO TABS
2.0000 mg | ORAL_TABLET | Freq: Every day | ORAL | Status: DC
  Administered 2023-11-26 – 2023-11-29 (×4): 2 mg via ORAL
  Filled 2023-11-26 (×5): qty 2

## 2023-11-26 MED ORDER — PROMETHAZINE HCL 25 MG/ML IJ SOLN
INTRAMUSCULAR | Status: AC
  Filled 2023-11-26: qty 1

## 2023-11-26 MED ORDER — HYDROMORPHONE HCL 1 MG/ML IJ SOLN
0.5000 mg | INTRAMUSCULAR | Status: DC | PRN
  Administered 2023-11-26 (×3): 0.5 mg via INTRAVENOUS
  Filled 2023-11-26 (×3): qty 0.5

## 2023-11-26 MED ORDER — PROCHLORPERAZINE EDISYLATE 10 MG/2ML IJ SOLN
10.0000 mg | Freq: Four times a day (QID) | INTRAMUSCULAR | Status: DC | PRN
  Administered 2023-11-26 – 2023-11-29 (×7): 10 mg via INTRAVENOUS
  Filled 2023-11-26 (×7): qty 2

## 2023-11-26 MED ORDER — LISINOPRIL 10 MG PO TABS
40.0000 mg | ORAL_TABLET | Freq: Every day | ORAL | Status: DC
  Administered 2023-11-26 – 2023-11-29 (×4): 40 mg via ORAL
  Filled 2023-11-26 (×4): qty 4

## 2023-11-26 MED ORDER — ONDANSETRON HCL 4 MG/2ML IJ SOLN: 4.0000 mg | Freq: Four times a day (QID) | INTRAMUSCULAR | Status: DC | PRN

## 2023-11-26 MED ORDER — POTASSIUM CHLORIDE 10 MEQ/100ML IV SOLN
10.0000 meq | INTRAVENOUS | Status: AC
  Administered 2023-11-26 (×4): 10 meq via INTRAVENOUS
  Filled 2023-11-26 (×4): qty 100

## 2023-11-26 MED ORDER — K PHOS MONO-SOD PHOS DI & MONO 155-852-130 MG PO TABS: 250.0000 mg | ORAL_TABLET | Freq: Every day | ORAL | Status: DC

## 2023-11-26 NOTE — Plan of Care (Signed)

## 2023-11-26 NOTE — Progress Notes (Signed)
 RN spoke to [facility] and will bring the phenergan  IV as soon as possible. Re-timed dose for 2000

## 2023-11-26 NOTE — Plan of Care (Signed)
   Problem: Education: Goal: Knowledge of General Education information will improve Description: Including pain rating scale, medication(s)/side effects and non-pharmacologic comfort measures Outcome: Progressing   Problem: Clinical Measurements: Goal: Will remain free from infection Outcome: Progressing   Problem: Activity: Goal: Risk for activity intolerance will decrease Outcome: Progressing

## 2023-11-26 NOTE — Evaluation (Signed)
 Physical Therapy Evaluation Patient Details Name: Renee Gutierrez MRN: [id number] DOB: 1960-06-01 Today's Date: 11/26/2023  History of Present Illness  Renee Gutierrez is a 63 y.o. female with medical history significant of hypertension, hyperlipidemia, anxiety, GERD, chronic pain syndrome, trigeminal neuralgia who presents to the emergency department due to nausea, vomiting, abdominal pain and decreased oral intake.  She states that she was recently admitted to [facility] for 4 days and was treated for pneumonia, hypokalemia and abdominal infection.  She was discharged about 3 days ago, but has continued to complain of symptoms.  She also complained of left facial pain which she states was due to history of trigeminal neuralgia.  She went back to [facility] ED today and she was only given antiemetic shot and discharged home, so she decided to go to the ED due to persistence of symptoms.  She endorsed normal bowel movement with last BM being today.   Clinical Impression  Patient functioning near baseline for functional mobility and gait other than c/o mild weakness in legs and dizziness during ambulation without loss of balance. Patient encouraged to ambulate with nursing staff/mobility tech for length of stay. Plan:  Patient discharged from physical therapy to care of nursing for ambulation daily as tolerated for length of stay.          If plan is discharge home, recommend the following: Help with stairs or ramp for entrance;Assist for transportation;A little help with walking and/or transfers   Can travel by private vehicle        Equipment Recommendations None recommended by PT  Recommendations for Other Services       Functional Status Assessment Patient has had a recent decline in their functional status and/or demonstrates limited ability to make significant improvements in function in a reasonable and predictable amount of time     Precautions / Restrictions  Precautions Precautions: Fall Recall of Precautions/Restrictions: Intact Restrictions Weight Bearing Restrictions Per Provider Order: No      Mobility  Bed Mobility Overal bed mobility: Independent                  Transfers Overall transfer level: Independent                 General transfer comment: Able to ambualte in hall without AD; does report history of blacking out and falling. Reported that her legs felt weak after ~100 or more of ambulation in the hall.    Ambulation/Gait Ambulation/Gait assistance: Modified independent (Device/Increase time), Supervision Gait Distance (Feet): 75 Feet Assistive device: None Gait Pattern/deviations: WFL(Within Functional Limits) Gait velocity: decreased     General Gait Details: slightly labored movement without loss of balance, limited mostly due to c/o mild dizziness  Stairs            Wheelchair Mobility     Tilt Bed    Modified Rankin (Stroke Patients Only)       Balance Overall balance assessment: Mild deficits observed, not formally tested                                           Pertinent Vitals/Pain Pain Assessment Pain Assessment: Faces Faces Pain Scale: Hurts little more Pain Location: L side of face and L shoulder Pain Descriptors / Indicators: Discomfort Pain Intervention(s): Monitored during session    Home Living Family/patient expects to be discharged to:: Private residence  Living Arrangements: Spouse/significant other Available Help at Discharge: Family;Available 24 hours/day Type of Home: House Home Access: Stairs to enter Entrance Stairs-Rails: Right Entrance Stairs-Number of Steps: 2   Home Layout: One level Home Equipment: Rollator (4 wheels);BSC/3in1;Tub bench      Prior Function Prior Level of Function : Independent/Modified Independent;History of Falls (last six months)             Mobility Comments: Independent ; history of syncope and  falling ever since brain surgeries. ADLs Comments: Independent.     Extremity/Trunk Assessment   Upper Extremity Assessment Upper Extremity Assessment: Defer to OT evaluation    Lower Extremity Assessment Lower Extremity Assessment: Overall WFL for tasks assessed    Cervical / Trunk Assessment Cervical / Trunk Assessment: Kyphotic  Communication   Communication Communication: No apparent difficulties    Cognition Arousal: Alert Behavior During Therapy: WFL for tasks assessed/performed   PT - Cognitive impairments: No apparent impairments                         Following commands: Intact       Cueing Cueing Techniques: Verbal cues, Tactile cues, Visual cues     General Comments      Exercises     Assessment/Plan    PT Assessment Patient does not need any further PT services  PT Problem List         PT Treatment Interventions      PT Goals (Current goals can be found in the Care Plan section)  Acute Rehab PT Goals Patient Stated Goal: return home with family to assist PT Goal Formulation: With patient Time For Goal Achievement: 11/26/23 Potential to Achieve Goals: Good    Frequency       Co-evaluation PT/OT/SLP Co-Evaluation/Treatment: Yes Reason for Co-Treatment: To address functional/ADL transfers PT goals addressed during session: Mobility/safety with mobility;Balance OT goals addressed during session: ADL's and self-care       AM-PAC PT 6 Clicks Mobility  Outcome Measure Help needed turning from your back to your side while in a flat bed without using bedrails?: None Help needed moving from lying on your back to sitting on the side of a flat bed without using bedrails?: None Help needed moving to and from a bed to a chair (including a wheelchair)?: None Help needed standing up from a chair using your arms (e.g., wheelchair or bedside chair)?: None Help needed to walk in hospital room?: A Little Help needed climbing 3-5 steps  with a railing? : A Little 6 Click Score: 22    End of Session   Activity Tolerance: Patient tolerated treatment well;Patient limited by fatigue Patient left: in chair;with call bell/phone within reach Nurse Communication: Mobility status PT Visit Diagnosis: Unsteadiness on feet (R26.81);Other abnormalities of gait and mobility (R26.89);Muscle weakness (generalized) (M62.81)    Time: 1045-1101 PT Time Calculation (min) (ACUTE ONLY): 16 min   Charges:   PT Evaluation $PT Eval Low Complexity: 1 Low PT Treatments $Gait Training: 8-22 mins PT General Charges $$ ACUTE PT VISIT: 1 Visit         2:09 PM, 11/26/23 Lynwood Music, MPT Physical Therapist with [facility] [phone] office [phone] mobile phone

## 2023-11-26 NOTE — Evaluation (Signed)
 Occupational Therapy Evaluation Patient Details Name: Renee Gutierrez MRN: [id number] DOB: [birth year] Today's Date: 11/26/2023   History of Present Illness   Renee Gutierrez is a 63 y.o. female with medical history significant of hypertension, hyperlipidemia, anxiety, GERD, chronic pain syndrome, trigeminal neuralgia who presents to the emergency department due to nausea, vomiting, abdominal pain and decreased oral intake.  She states that she was recently admitted to [facility] for 4 days and was treated for pneumonia, hypokalemia and abdominal infection.  She was discharged about 3 days ago, but has continued to complain of symptoms.  She also complained of left facial pain which she states was due to history of trigeminal neuralgia.  She went back to [facility] ED today and she was only given antiemetic shot and discharged home, so she decided to go to the ED due to persistence of symptoms.  She endorsed normal bowel movement with last BM being today. (per DO)     Clinical Impressions Pt agreeable to OT and PT co-evaluation. Pt appears to be at and independent level for ADL's and functional ambulation/transfers today. Pt reports history of falling due to blacking out ever since a brain surgery she underwent. Pt educated to have seats nearby during periods of prolonged standing like standing at the sink. Pt left in the chair with call bell within reach. Pt is not recommended for any further acute OT services and will be discharged to care of nursing staff for remaining length of stay.         If plan is discharge home, recommend the following:   Assist for transportation     Functional Status Assessment   Patient has not had a recent decline in their functional status     Equipment Recommendations   None recommended by OT;Other (comment) (Educated on having seats near by during prolonged standing due to history of blacking out.)              Precautions/Restrictions   Precautions Precautions: Fall Recall of Precautions/Restrictions: Intact Restrictions Weight Bearing Restrictions Per Provider Order: No     Mobility Bed Mobility Overal bed mobility: Independent                  Transfers Overall transfer level: Independent                 General transfer comment: Able to ambualte in hall without AD; does report history of blacking out and falling. Reported that her legs felt weak after ~100 or more of ambulation in the hall.      Balance Overall balance assessment: Mild deficits observed, not formally tested                                         ADL either performed or assessed with clinical judgement   ADL Overall ADL's : Independent                                       General ADL Comments: Mild weakness in functional ambulation but no physical assist for any tasks related to ADL's.     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.) Vision Assessment?: No  apparent visual deficits     Perception Perception: Not tested       Praxis Praxis: Not tested       Pertinent Vitals/Pain Pain Assessment Pain Assessment: Faces Faces Pain Scale: Hurts little more Pain Location: L side of face and L shoulder Pain Descriptors / Indicators: Discomfort Pain Intervention(s): Monitored during session, Repositioned     Extremity/Trunk Assessment Upper Extremity Assessment Upper Extremity Assessment: Generalized weakness;Overall [facility] for tasks assessed   Lower Extremity Assessment Lower Extremity Assessment: Defer to PT evaluation   Cervical / Trunk Assessment Cervical / Trunk Assessment: Kyphotic   Communication Communication Communication: No apparent difficulties   Cognition Arousal: Alert Behavior  During Therapy: WFL for tasks assessed/performed Cognition: No apparent impairments                               Following commands: Intact       Cueing  General Comments   Cueing Techniques: Verbal cues;Tactile cues;Visual cues                 Home Living Family/patient expects to be discharged to:: Private residence Living Arrangements: Spouse/significant other Available Help at Discharge: Family;Available 24 hours/day Type of Home: House Home Access: Stairs to enter [employer] of Steps: 2 Entrance Stairs-Rails: Right Home Layout: One level     Bathroom Shower/Tub: Chief Strategy Officer: Standard Bathroom Accessibility: Yes   Home Equipment: Rollator (4 wheels);BSC/3in1;Tub bench          Prior Functioning/Environment Prior Level of Function : Independent/Modified Independent;History of Falls (last six months)             Mobility Comments: Independent ; history of syncope and falling ever since brain surgeries. ADLs Comments: Independent.                            Co-evaluation PT/OT/SLP Co-Evaluation/Treatment: Yes Reason for Co-Treatment: To address functional/ADL transfers   OT goals addressed during session: ADL's and self-care      AM-PAC OT 6 Clicks Daily Activity     Outcome Measure Help from another Gutierrez eating meals?: None Help from another Gutierrez taking care of personal grooming?: None Help from another Gutierrez toileting, which includes using toliet, bedpan, or urinal?: None Help from another Gutierrez bathing (including washing, rinsing, drying)?: None Help from another Gutierrez to put on and taking off regular upper body clothing?: None Help from another Gutierrez to put on and taking off regular lower body clothing?: None 6 Click Score: 24   End of Session    Activity Tolerance: Patient tolerated treatment well Patient left: in chair;with call bell/phone within reach  OT Visit  Diagnosis: Unsteadiness on feet (R26.81);Other abnormalities of gait and mobility (R26.89)                Time: 8951-8898 OT Time Calculation (min): 13 min Charges:  OT General Charges $OT Visit: 1 Visit OT Evaluation $OT Eval Low Complexity: 1 Low  Renee Gutierrez OT, MOT  Renee Gutierrez 11/26/2023, 12:28 PM

## 2023-11-26 NOTE — Progress Notes (Signed)
 Progress Note   Patient: Renee Gutierrez FMW:[id number] DOB: [birth year] DOA: 11/25/2023     1 DOS: the patient was seen and examined on 11/26/2023   Brief hospital course: Renee Gutierrez is a 63 y.o. female with medical history significant of hypertension, hyperlipidemia, anxiety, GERD, chronic pain syndrome, trigeminal neuralgia who presents to the emergency department due to nausea, vomiting, abdominal pain and decreased oral intake.    Assessment and Plan: Hypokalemia Due to GI losses. K+ is 2.8 on presentation Improved to 3.1 status post KCl x 6 doses. Will replete with IV KCl, K-Phos. She does not want to take orals yet. Monitor daily renal function, electrolytes and replete accordingly.  Nausea and vomiting CT abdomen showed large hiatal hernia. She has been having symptoms since 1 month per daughter, lost weight. Has family h/o colon cancer, daughter requested GI eval. Continue Compazine , reglan . Avoid zofran  due to prolonged QT.   Rectosigmoid colitis- Seen on CT abdomen pelvis. Gentle IV hydration. continue IV Zosyn therapy. Continue IV Dilaudid  0.5 mg q.4h p.r.n. for moderate to severe pain Continue IV Compazine  p.r.n. Continue clear liquid diet with plan to advance diet as tolerated Follow blood cultures.   Generalized weakness Debility, transient confusion- In the setting of severe GI symptoms, electrolyte abnormalities. Advance diet. Continue supportive care, fall precautions PT OT evaluation for discharge planning   Prolonged QT interval Initial EKG prolonged QT noted, repeat EKG improved Avoid QT prolonging drugs Continue to replete electrolytes as needed.   Esophageal hiatal hernia GERD Continue Protonix . GI evaluation for possible EGD   Essential hypertension Continue home dose amlodipine , labetalol    Restless leg syndrome Continue ropinirole .   Trigeminal neuralgia  Dilaudid  as needed.  At risk for malnutrition- BMI 18.3. Dietitian  consultation Encourage and advance diet as tolerated.     Out of bed to chair. Incentive spirometry. Nursing supportive care. Fall, aspiration precautions. Diet:  Diet Orders (From admission, onward)     Start     Ordered   11/26/23 0641  Diet clear liquid Room service appropriate? Yes; Fluid consistency: Thin  Diet effective now       Question Answer Comment  Room service appropriate? Yes   Fluid consistency: Thin      11/26/23 0641           DVT prophylaxis: enoxaparin  (LOVENOX ) injection 30 mg Start: 11/26/23 1000 SCDs Start: 11/26/23 0411  Level of care: Telemetry   Code Status: Full Code  Subjective: Patient is seen and examined today morning.  She is lying in bed, has diffuse abdominal discomfort, nausea.  Does not want to take anything by mouth yet due to nausea.  Physical Exam: Vitals:   11/26/23 0000 11/26/23 0027 11/26/23 0525 11/26/23 0819  BP: 128/69 137/80 (!) 146/68 (!) 179/73  Pulse: 71 76 73   Resp: (!) 22 18    Temp:  98.7 F (37.1 C) 98.5 F (36.9 C) 98.4 F (36.9 C)  TempSrc:  Oral Oral Oral  SpO2: 97% 100% 99% 96%  Weight:        General - Elderly ill looking Caucasian thin built female, distress due to nausea HEENT - PERRLA, EOMI, atraumatic head, non tender sinuses. Lung - Clear, basal rales, no rhonchi, wheezes. Heart - S1, S2 heard, no murmurs, rubs, no pedal edema. Abdomen - Soft, diffuse tender, no guarding, bowel sounds good Neuro - Alert, awake and oriented, non focal exam. Skin - Warm and dry.  Data Reviewed:      Latest  Ref Rng & Units 11/26/2023    5:44 AM 11/25/2023    8:08 PM 11/12/2023   12:00 PM  CBC  WBC 4.0 - 10.5 K/uL 11.0  10.8  7.7   Hemoglobin 12.0 - 15.0 g/dL 7.7  8.8  8.1   Hematocrit 36.0 - 46.0 % 24.9  28.5  26.7   Platelets 150 - 400 K/uL 537  712  454       Latest Ref Rng & Units 11/26/2023    5:44 AM 11/25/2023    8:08 PM 11/12/2023   12:00 PM  BMP  Glucose 70 - 99 mg/dL 77  99  894   BUN 8 - 23  mg/dL 5  7  13    Creatinine 0.44 - 1.00 mg/dL 9.40  9.20  9.36   Sodium 135 - 145 mmol/L 138  140  139   Potassium 3.5 - 5.1 mmol/L 3.2  2.8  3.5   Chloride 98 - 111 mmol/L 103  101  107   CO2 22 - 32 mmol/L 24  23  24    Calcium  8.9 - 10.3 mg/dL 8.3  9.2  8.6    CT Angio Chest PE W and/or Wo Contrast Result Date: 11/25/2023 CLINICAL DATA:  Pulmonary embolus suspected with high probability. Recent discharge from [facility] for low potassium, pneumonia, and bacterial infection of GI tract. Acute nonlocalized abdominal pain. EXAM: CT ANGIOGRAPHY CHEST CT ABDOMEN AND PELVIS WITH CONTRAST TECHNIQUE: Multidetector CT imaging of the chest was performed using the standard protocol during bolus administration of intravenous contrast. Multiplanar CT image reconstructions and MIPs were obtained to evaluate the vascular anatomy. Multidetector CT imaging of the abdomen and pelvis was performed using the standard protocol during bolus administration of intravenous contrast. RADIATION DOSE REDUCTION: This exam was performed according to the departmental dose-optimization program which includes automated exposure control, adjustment of the mA and/or kV according to patient size and/or use of iterative reconstruction technique. CONTRAST:  OMNIPAQUE  IOHEXOL  350 MG/ML SOLN COMPARISON:  Chest radiograph 11/25/2023. CT abdomen and pelvis 09/20/2023. CT chest 07/05/2011 FINDINGS: CTA CHEST FINDINGS Cardiovascular: Technically adequate study with good opacification of the central and segmental pulmonary arteries. Moderate motion artifact. No focal filling defects. No evidence of significant pulmonary embolus. Cardiac enlargement. Small pericardial effusion. Normal caliber thoracic aorta. No aortic dissection. Great vessel origins are patent. Calcification of the aorta and coronary arteries. Mediastinum/Nodes: Large esophageal hiatal hernia. Esophagus is decompressed. No significant lymphadenopathy. Thyroid gland is  unremarkable. Lungs/Pleura: Trace left pleural effusion. Atelectasis in the left base. No pneumothorax. Musculoskeletal: Postoperative changes in the cervical spine. Degenerative changes in the thoracic spine. No acute bony abnormalities. Review of the MIP images confirms the above findings. CT ABDOMEN and PELVIS FINDINGS Hepatobiliary: No focal liver abnormality is seen. Status post cholecystectomy. No biliary dilatation. Pancreas: Unremarkable. No pancreatic ductal dilatation or surrounding inflammatory changes. Spleen: Normal in size without focal abnormality. Adrenals/Urinary Tract: Adrenal glands are unremarkable. Kidneys are normal, without renal calculi, focal lesion, or hydronephrosis. Bladder is unremarkable. Stomach/Bowel: Stomach, small bowel, and colon are not abnormally distended. Under distention limits evaluation but there is evidence of wall thickening in the rectosigmoid region which likely indicates colitis. This may represent infectious or inflammatory etiologies. No abscess identified. Appendix is not visualized. Vascular/Lymphatic: Aortic atherosclerosis. No enlarged abdominal or pelvic lymph nodes. Reproductive: Status post hysterectomy. No adnexal masses. Other: No free air or free fluid in the abdomen. Abdominal wall musculature appears intact. Edema in the subcutaneous fat. Musculoskeletal: Lumbar  scoliosis convex towards the left. Degenerative changes in the lumbar spine. No acute bony abnormalities. Review of the MIP images confirms the above findings. IMPRESSION: 1. No evidence of significant pulmonary embolus. 2. Large esophageal hiatal hernia. 3. Small left pleural effusion with basilar atelectasis. 4. Aortic atherosclerosis. 5. Wall thickening in the rectosigmoid colon suggesting colitis. No abscess. No proximal obstruction. Electronically Signed   By: Elsie Gravely M.D.   On: 11/25/2023 22:21   CT ABDOMEN PELVIS W CONTRAST Result Date: 11/25/2023 CLINICAL DATA:  Pulmonary  embolus suspected with high probability. Recent discharge from [facility] for low potassium, pneumonia, and bacterial infection of GI tract. Acute nonlocalized abdominal pain. EXAM: CT ANGIOGRAPHY CHEST CT ABDOMEN AND PELVIS WITH CONTRAST TECHNIQUE: Multidetector CT imaging of the chest was performed using the standard protocol during bolus administration of intravenous contrast. Multiplanar CT image reconstructions and MIPs were obtained to evaluate the vascular anatomy. Multidetector CT imaging of the abdomen and pelvis was performed using the standard protocol during bolus administration of intravenous contrast. RADIATION DOSE REDUCTION: This exam was performed according to the departmental dose-optimization program which includes automated exposure control, adjustment of the mA and/or kV according to patient size and/or use of iterative reconstruction technique. CONTRAST:  OMNIPAQUE  IOHEXOL  350 MG/ML SOLN COMPARISON:  Chest radiograph 11/25/2023. CT abdomen and pelvis 09/20/2023. CT chest 07/05/2011 FINDINGS: CTA CHEST FINDINGS Cardiovascular: Technically adequate study with good opacification of the central and segmental pulmonary arteries. Moderate motion artifact. No focal filling defects. No evidence of significant pulmonary embolus. Cardiac enlargement. Small pericardial effusion. Normal caliber thoracic aorta. No aortic dissection. Great vessel origins are patent. Calcification of the aorta and coronary arteries. Mediastinum/Nodes: Large esophageal hiatal hernia. Esophagus is decompressed. No significant lymphadenopathy. Thyroid gland is unremarkable. Lungs/Pleura: Trace left pleural effusion. Atelectasis in the left base. No pneumothorax. Musculoskeletal: Postoperative changes in the cervical spine. Degenerative changes in the thoracic spine. No acute bony abnormalities. Review of the MIP images confirms the above findings. CT ABDOMEN and PELVIS FINDINGS Hepatobiliary: No focal liver  abnormality is seen. Status post cholecystectomy. No biliary dilatation. Pancreas: Unremarkable. No pancreatic ductal dilatation or surrounding inflammatory changes. Spleen: Normal in size without focal abnormality. Adrenals/Urinary Tract: Adrenal glands are unremarkable. Kidneys are normal, without renal calculi, focal lesion, or hydronephrosis. Bladder is unremarkable. Stomach/Bowel: Stomach, small bowel, and colon are not abnormally distended. Under distention limits evaluation but there is evidence of wall thickening in the rectosigmoid region which likely indicates colitis. This may represent infectious or inflammatory etiologies. No abscess identified. Appendix is not visualized. Vascular/Lymphatic: Aortic atherosclerosis. No enlarged abdominal or pelvic lymph nodes. Reproductive: Status post hysterectomy. No adnexal masses. Other: No free air or free fluid in the abdomen. Abdominal wall musculature appears intact. Edema in the subcutaneous fat. Musculoskeletal: Lumbar scoliosis convex towards the left. Degenerative changes in the lumbar spine. No acute bony abnormalities. Review of the MIP images confirms the above findings. IMPRESSION: 1. No evidence of significant pulmonary embolus. 2. Large esophageal hiatal hernia. 3. Small left pleural effusion with basilar atelectasis. 4. Aortic atherosclerosis. 5. Wall thickening in the rectosigmoid colon suggesting colitis. No abscess. No proximal obstruction. Electronically Signed   By: Elsie Gravely M.D.   On: 11/25/2023 22:21   DG Chest Port 1 View Result Date: 11/25/2023 EXAM: 1 VIEW(S) XRAY OF THE CHEST 11/25/2023 08:15:00 PM COMPARISON: 07/06/2023 CLINICAL HISTORY: Questionable sepsis - evaluate for abnormality. FINDINGS: LUNGS AND PLEURA: No focal pulmonary opacity. No pulmonary edema. No pleural effusion. No pneumothorax. HEART  AND MEDIASTINUM: Normal heart size and mediastinal contours. Atherosclerotic calcifications. BONES AND SOFT TISSUES: Cervical  fusion hardware is noted. No acute osseous abnormality. DIAPHRAGM AND UPPER ABDOMEN: A small hiatal hernia is present. IMPRESSION: 1. No acute abnormalities. Electronically signed by: Andrea Gasman MD 11/25/2023 08:25 PM EDT RP Workstation: [id number]    Family Communication: Discussed with patient, daughter over phone.  They understand and agree. All questions answered.  Disposition: Status is: Inpatient Remains inpatient appropriate because: IV antibiotics, IV fluids, advance diet, GI evaluation  Planned Discharge Destination: Home with Home Health     Time spent: 46 minutes  Author: Concepcion Riser, MD 11/26/2023 10:55 AM Secure chat 7am to 7pm For on call review [email or web address].

## 2023-11-26 NOTE — TOC Initial Note (Signed)
 Transition of Care [facility]) - Initial/Assessment Note    Patient Details  Name: Renee Gutierrez MRN: [id number] Date of Birth: November 01, 1960  Transition of Care [facility]) CM/SW Contact:    Noreen KATHEE Cleotilde ISRAEL Phone Number: 11/26/2023, 11:44 AM  Clinical Narrative:                  Patient is at risk for readmission. Patient was admitted for Hypokalemia . CSW assessed patient at bedside. Patient reported that she is independent at home with ADL's and her husband assist sometimes. Patient reports that she does not have nor use any equipment at home. Also reports that she does not have any in home care services. Patient plans are to return back home.CSW will continue to follow.    Expected Discharge Plan: Home/Self Care Barriers to Discharge: Continued Medical Work up   Patient Goals and CMS Choice Patient states their goals for this hospitalization and ongoing recovery are:: return back home CMS Medicare.gov Compare Post Acute Care list provided to:: Patient        Expected Discharge Plan and Services In-house Referral: Clinical Social Work     Living arrangements for the past 2 months: Single Family Home                                      Prior Living Arrangements/Services Living arrangements for the past 2 months: Single Family Home Lives with:: Spouse Patient language and need for interpreter reviewed:: Yes Do you feel safe going back to the place where you live?: Yes      Need for Family Participation in Patient Care: Yes (Comment) Care giver support system in place?: No (comment) Current home services: DME Criminal Activity/Legal Involvement Pertinent to Current Situation/Hospitalization: No - Comment as needed  Activities of Daily Living      Permission Sought/Granted      Share Information with NAME: Jazmen     Permission granted to share info w Relationship: patient     Emotional Assessment Appearance:: Appears stated age Attitude/Demeanor/Rapport:  Engaged Affect (typically observed): Accepting Orientation: : Oriented to Self, Oriented to Place, Oriented to  Time, Oriented to Situation Alcohol / Substance Use: Tobacco Use Psych Involvement: No (comment)  Admission diagnosis:  Hypokalemia [E87.6] Colitis [K52.9] Nausea and vomiting, unspecified vomiting type [R11.2] Patient Active Problem List   Diagnosis Date Noted   Colitis 11/26/2023   Prolonged QT interval 11/26/2023   Esophageal hiatal hernia 11/26/2023   Essential hypertension 11/26/2023   Hyperkalemia 11/02/2023   Chronic anemia 11/02/2023   Trigeminal neuralgia 05/13/2023   Anemia, unspecified 05/30/2020   Acute bronchitis 05/30/2020   Anxiety 05/30/2020   AVM (arteriovenous malformation) brain 05/30/2020   At risk for falls 05/30/2020   Cellulitis of nose 05/30/2020   Edema 05/30/2020   External otitis of left ear 05/30/2020   Hypokalemia 05/30/2020   Hyperlipidemia 05/30/2020   History of intussusception 05/30/2020   Migraines 05/30/2020   Restless legs syndrome 05/30/2020   Kidney disease, chronic, stage II (mild, 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) 05/30/2020   Generalized weakness 05/30/2020   Insomnia 05/30/2020   Intussusception (HCC) 05/30/2020   Lip laceration 05/30/2020   Medicare annual wellness visit, subsequent 05/30/2020   MRSA (methicillin resistant staph aureus) culture positive 05/30/2020   Rash 05/30/2020   Prediabetes 05/30/2020   Nausea & vomiting 05/30/2020   Encounter for general adult medical examination without abnormal findings 05/30/2020  Chronic hip pain 05/30/2020   IBS (irritable bowel syndrome) 05/30/2020   Long-term current use of opiate analgesic 11/19/2018   ACTH elevation 12/12/2016   Trigeminal neuralgia pain 07/31/2016   Resistant hypertension 02/18/2016   Acute viral conjunctivitis of left eye 02/18/2016   Atypical facial pain 02/18/2016   Epistaxis 02/14/2016   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   Ganglion cyst of dorsum of  right wrist 09/13/2015   Mild intermittent asthma without complication 04/25/2015   Idiopathic peripheral neuropathy 11/22/2014   Adrenal adenoma, right 07/10/2014   Prolactinoma (HCC) 07/07/2014   Osteoarthritis of cervical spine 06/06/2014   Myofascial pain syndrome 04/20/2014   Numbness in both hands 10/20/2013   Cubital tunnel syndrome 08/24/2013   Spinal stenosis of lumbar region with neurogenic claudication 08/03/2013   GAD (generalized anxiety disorder) 09/12/2011   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 08/08/2011   Low back pain 08/08/2011   Severe episode of recurrent major depressive disorder (HCC) 08/08/2011   GERD (gastroesophageal reflux disease) 11/01/2009   PCP:  Patient, No Pcp Per Pharmacy:   [id number] [place], [place] - [address] AT [facility] [address] [place] [address] Phone: [phone] Fax: [phone]  [facility] (Now [facility]) - [address] [place], [place] - [address] [place] [address] Phone: [phone] Fax: [phone]     Social Drivers of Health (SDOH) Social History: SDOH Screenings   Food Insecurity: Food Insecurity Present (11/26/2023)  Housing: Low Risk  (11/26/2023)  Transportation Needs: No Transportation Needs (11/26/2023)  Utilities: Not At Risk (11/26/2023)  Tobacco Use: High Risk (11/25/2023)   SDOH Interventions:     Readmission Risk Interventions    11/26/2023   11:40 AM  Readmission Risk Prevention Plan  Transportation Screening Complete  Medication Review Oceanographer) Complete  HRI or Home Care Consult Complete  SW Recovery Care/Counseling Consult Complete  Palliative Care Screening Not Applicable  Skilled Nursing Facility Not Applicable

## 2023-11-27 ENCOUNTER — Encounter (HOSPITAL_COMMUNITY): Payer: Self-pay | Admitting: Internal Medicine

## 2023-11-27 DIAGNOSIS — D649 Anemia, unspecified: Secondary | ICD-10-CM

## 2023-11-27 DIAGNOSIS — K529 Noninfective gastroenteritis and colitis, unspecified: Secondary | ICD-10-CM | POA: Diagnosis not present

## 2023-11-27 DIAGNOSIS — R112 Nausea with vomiting, unspecified: Secondary | ICD-10-CM | POA: Diagnosis not present

## 2023-11-27 DIAGNOSIS — G500 Trigeminal neuralgia: Secondary | ICD-10-CM | POA: Diagnosis not present

## 2023-11-27 DIAGNOSIS — K449 Diaphragmatic hernia without obstruction or gangrene: Secondary | ICD-10-CM | POA: Diagnosis not present

## 2023-11-27 DIAGNOSIS — K5909 Other constipation: Secondary | ICD-10-CM

## 2023-11-27 DIAGNOSIS — E876 Hypokalemia: Secondary | ICD-10-CM | POA: Diagnosis not present

## 2023-11-27 DIAGNOSIS — D62 Acute posthemorrhagic anemia: Secondary | ICD-10-CM

## 2023-11-27 LAB — CBC
HCT: 31.4 % — ABNORMAL LOW (ref 36.0–46.0)
Hemoglobin: 9.6 g/dL — ABNORMAL LOW (ref 12.0–15.0)
MCH: 27.1 pg (ref 26.0–34.0)
MCHC: 30.6 g/dL (ref 30.0–36.0)
MCV: 88.7 fL (ref 80.0–100.0)
Platelets: 735 K/uL — ABNORMAL HIGH (ref 150–400)
RBC: 3.54 MIL/uL — ABNORMAL LOW (ref 3.87–5.11)
RDW: 14.4 % (ref 11.5–15.5)
WBC: 15.1 K/uL — ABNORMAL HIGH (ref 4.0–10.5)
nRBC: 0 % (ref 0.0–0.2)

## 2023-11-27 LAB — BASIC METABOLIC PANEL WITH GFR
Anion gap: 14 (ref 5–15)
BUN: <5 mg/dL — ABNORMAL LOW (ref 8–23)
CO2: 25 mmol/L (ref 22–32)
Calcium: 7.6 mg/dL — ABNORMAL LOW (ref 8.9–10.3)
Chloride: 101 mmol/L (ref 98–111)
Creatinine, Ser: 0.69 mg/dL (ref 0.44–1.00)
GFR, Estimated: >60 mL/min (ref >60)
Glucose, Bld: 123 mg/dL — ABNORMAL HIGH (ref 70–99)
Potassium: 3.5 mmol/L (ref 3.5–5.1)
Sodium: 140 mmol/L (ref 135–145)

## 2023-11-27 MED ORDER — AMLODIPINE BESYLATE 5 MG PO TABS
5.0000 mg | ORAL_TABLET | Freq: Once | ORAL | Status: AC
  Administered 2023-11-27: 5 mg via ORAL
  Filled 2023-11-27: qty 1

## 2023-11-27 MED ORDER — FERROUS SULFATE 325 (65 FE) MG PO TABS
325.0000 mg | ORAL_TABLET | Freq: Every day | ORAL | Status: DC
  Administered 2023-11-28 – 2023-11-29 (×2): 325 mg via ORAL
  Filled 2023-11-27 (×2): qty 1

## 2023-11-27 MED ORDER — POTASSIUM CHLORIDE 2 MEQ/ML IV SOLN
INTRAVENOUS | Status: DC
  Filled 2023-11-27 (×2): qty 1000

## 2023-11-27 MED ORDER — AMLODIPINE BESYLATE 5 MG PO TABS
10.0000 mg | ORAL_TABLET | Freq: Every day | ORAL | Status: DC
  Administered 2023-11-28 – 2023-11-29 (×2): 10 mg via ORAL
  Filled 2023-11-27 (×2): qty 2

## 2023-11-27 MED ORDER — HYDRALAZINE HCL 20 MG/ML IJ SOLN
20.0000 mg | Freq: Four times a day (QID) | INTRAMUSCULAR | Status: DC | PRN
  Administered 2023-11-27: 20 mg via INTRAVENOUS
  Filled 2023-11-27: qty 1

## 2023-11-27 MED ORDER — LINACLOTIDE 145 MCG PO CAPS
145.0000 ug | ORAL_CAPSULE | Freq: Every day | ORAL | Status: DC
  Administered 2023-11-27 – 2023-11-29 (×3): 145 ug via ORAL
  Filled 2023-11-27 (×3): qty 1

## 2023-11-27 MED ORDER — ENOXAPARIN SODIUM 30 MG/0.3ML IJ SOSY
30.0000 mg | PREFILLED_SYRINGE | INTRAMUSCULAR | Status: DC
  Administered 2023-11-29: 30 mg via SUBCUTANEOUS
  Filled 2023-11-27: qty 0.3

## 2023-11-27 NOTE — Final Consult Note (Signed)
 Gastroenterology Consult   Referring Provider: No ref. provider found Primary Care Physician:  Patient, No Pcp Per Primary Gastroenterologist:  remotely seen at [facility]  Patient ID: Renee Gutierrez; [id number]; [birth year]   Admit date: 11/25/2023  LOS: 2 days   Date of Consultation: 11/27/2023  Reason for Consultation:  intractable N/V    History of Present Illness   Renee Gutierrez is a 63 y.o. 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 She had been admitted at [facility] for 4 nights and treated for PNA and abdominal infection and hypokalemia, per EMR. Per patient she was discharged while actively vomiting. She went back to the ED with ongoing symptoms but treated in ED and sent home. Patient presented to [facility] ED and admitted 11/25/23. GI consult today per family request for intractable N/V, colitis.    ED course: BP 201/110, pulse 101, temp 100.2. O2 sat 100% on RA.  Prolonged QT interval. Initial ECG 515, on repeat 469.  Resp panel negative. Potassium 2.8. WBC 10.8, Hgb 8.8 (8.1 two weeks prior) but Hgb 12 one month ago, Mg 1.9, Blood cultures X 2 prelim neg.   Iron 20, TIBC 230, iron sat 9, ferritin 70, B12 245, folate 14.1.   CTA chest/CT A/P complete: CTA chest this admission with large esophageal hiatal hernia. No PE. Underdistention limits evaluation but wall thickening of rectosigmoid region suggesting colitis.   She is on zosyn, IV pantoprazole , and IV reglan .   GI Consult:  In 09/2023, curbside consult with Dr.Ahmed by EDP regarding abnormal GE junction, intermttent dark stools/emesis, dilation of bile ducts with normal LFTs recommended  outpatient GI evaluation. Multiple office visits made but patient has not bee seen to date.   Recent admittion 10/2023 with persistent left shoulder pain, N/V having sustained mechanical faill 3 weeks prio.  She had coffee ground emesis at that time. Reported has chronic IDA on oral supplementation with normal Hgb back in early 10/2023, but declined since that time.  Prior GI history of possible Barrett's but not clear that there was any evidence on her last EGD at [facility] in 2013. Reports not available and there were no esophageal biopsies taken. She had Nissen fundoplication in 2011 for GERD. She reports previous EGD with esophageal dilation for dysphagia with relief.   She notes over the past two months she has had intractable nausea/vomiting. She reports 15 pound weight loss.   During her September hospitalization she had reported coffee-ground emesis.  2 weeks ago she saw hematemesis.  She has had a decline in hemoglobin from a normal hemoglobin of 12.2 in September down to 7.7 yesterday.  Her hemoglobin today is 9.6 however without documented blood transfusion.  Platelets 735,000.  She reports daily vomiting.  She struggles with everything she tries to eat.  She has some heartburn but tries to avoid food triggers.  She is on pantoprazole  40 mg daily.  She has dysphagia with solids, liquids, pills.  She has constant epigastric pain for 2 months.  Worse postprandially.  Only thing that gives her any relief is to avoid eating and pain medication.  Bowel movements are infrequent usually once every couple of weeks.  Stools can be hard.  Denies any melena  or rectal bleeding.  She has had a little bit of liquid stool the last couple of days.  Not a lot however.  She does not take anything for her bowels.  Laxatives make her feel bad, feels like the stool just sits there.  Never really tried anything like daily MiraLAX.  She has had constipation her entire life.  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  She has been using mostly Phenergan  at home for her nausea and vomiting.  States Zofran  sublingual makes her sick.  She uses excedrin  migraine as needed. Takes ibuprofen 200mg  TID every day.   THERE WAS REPORT OF FH OF COLON CANCER BUT PATIENT DENIES.  Last EGD/colonoscopy [facility] 2013: Op notes not available/path as outlined Duodenal bx: patchy gastric foveolar metaplasia and mild non-specific reactive/reparative changes Stomach bx: gastric antral and body mucosa with no path dx, no gastrisit. No h.pylori.  Colon polyp:hyperplastic     Prior to Admission medications   Medication Sig Start Date End Date Taking? Authorizing Provider  amLODipine  (NORVASC ) 5 MG tablet Take 5 mg by mouth daily. 11/24/23  Yes [provider]  aspirin -acetaminophen -caffeine  (EXCEDRIN  MIGRAINE) 250-250-65 MG tablet Take 2 tablets by mouth daily as needed for migraine or headache. 05/24/14  Yes [provider]  cefdinir (OMNICEF) 300 MG capsule Take 300 mg by mouth 2 (two) times daily. 11/24/23  Yes [provider]  diclofenac Sodium (VOLTAREN) 1 % GEL Apply 2 g topically 4 (four) times daily. 04/18/20  Yes [provider]  DULoxetine  (CYMBALTA ) 60 MG capsule Take 60 mg by mouth 2 (two) times daily. 05/16/20  Yes [provider]  erythromycin  ophthalmic ointment Place 1 Application into the left eye 3 (three) times daily.   Yes [provider]  labetalol  (NORMODYNE ) 200 MG tablet Take 200 mg by mouth 2 (two) times daily.   Yes [provider]  lisinopril  (PRINIVIL ,ZESTRIL ) 40 MG tablet Take 40 mg by mouth daily. 01/31/14 11/25/23 Yes [provider]  metroNIDAZOLE (FLAGYL) 250 MG tablet Take 500 mg by mouth 3 (three) times daily. 11/24/23  Yes [provider]  morphine  (MSIR) 15 MG tablet Take 15 mg by mouth every 4 (four) hours as needed for moderate pain (pain score 4-6). 11/05/23  Yes  [provider]  mupirocin ointment (BACTROBAN) 2 % Apply 1 Application topically 3 (three) times daily. 03/25/16  Yes [provider]  naloxone (NARCAN) nasal spray 4 mg/0.1 mL Place 1 spray into the nose once. 09/17/18  Yes [provider]  ondansetron  (ZOFRAN -ODT) 8 MG disintegrating tablet Take 8 mg by mouth every 6 (six) hours as needed for nausea or vomiting. 11/24/23  Yes [provider]  orphenadrine  (NORFLEX ) 100 MG tablet Take 100 mg by mouth at bedtime. 10/06/19  Yes [provider]  pantoprazole  (PROTONIX ) 40 MG tablet Take 1 tablet (40 mg total) by mouth daily. 11/03/23  Yes Johnson, Clanford L, MD  prednisoLONE  acetate (PRED FORTE ) 1 % ophthalmic suspension Place 1 drop into the left eye in the morning and at bedtime. 03/01/20  Yes [provider]  rOPINIRole  (REQUIP ) 2 MG tablet Take 2-4 mg by mouth See admin instructions. 2 mg am, 4 mg pm 11/11/14  Yes [provider]  simethicone (GAS RELIEF EXTRA STRENGTH) 125 MG chewable tablet Chew 125 mg by mouth daily as needed for flatulence.  05/24/14  Yes [provider]  Vitamin D ,  Ergocalciferol , (DRISDOL ) 1.25 MG (50000 UNIT) CAPS capsule Take 50,000 Units by mouth every Saturday. 05/10/23  Yes [provider]  Ibuprofen 200mg  tid  Current Facility-Administered Medications  Medication Dose Route Frequency Provider Last Rate Last Admin   acetaminophen  (TYLENOL ) tablet 650 mg  650 mg Oral Q6H PRN Adefeso, Oladapo, DO       Or   acetaminophen  (TYLENOL ) suppository 650 mg  650 mg Rectal Q6H PRN Adefeso, Oladapo, DO       amLODipine  (NORVASC ) tablet 5 mg  5 mg Oral Daily Melvenia Motto, MD   5 mg at 11/26/23 9044   enoxaparin  (LOVENOX ) injection 30 mg  30 mg Subcutaneous Q24H Adefeso, Oladapo, DO       HYDROmorphone  (DILAUDID ) injection 1 mg  1 mg Intravenous Q4H PRN Darci Pore, MD   1 mg at 11/26/23 2214   labetalol  (NORMODYNE ) tablet 200 mg  200 mg Oral BID  Melvenia Motto, MD   200 mg at 11/26/23 2131   lisinopril  (ZESTRIL ) tablet 40 mg  40 mg Oral Daily Sreeram, Narendranath, MD   40 mg at 11/26/23 1324   metoCLOPramide  (REGLAN ) injection 5 mg  5 mg Intravenous Q6H Darci Pore, MD   5 mg at 11/27/23 0019   pantoprazole  (PROTONIX ) injection 40 mg  40 mg Intravenous Q24H Adefeso, Oladapo, DO   40 mg at 11/26/23 1004   piperacillin-tazobactam (ZOSYN) IVPB 3.375 g  3.375 g Intravenous Q8H Clair Lynwood CROME, RPH 12.5 mL/hr at 11/27/23 0219 3.375 g at 11/27/23 9780   prochlorperazine  (COMPAZINE ) injection 10 mg  10 mg Intravenous Q6H PRN Adefeso, Oladapo, DO   10 mg at 11/27/23 0353   rOPINIRole  (REQUIP ) tablet 2 mg  2 mg Oral Daily Adefeso, Oladapo, DO   2 mg at 11/26/23 0955   rOPINIRole  (REQUIP ) tablet 4 mg  4 mg Oral QHS Adefeso, Oladapo, DO   4 mg at 11/26/23 2131    Allergies as of 11/25/2023 - Review Complete 11/25/2023  Allergen Reaction Noted   Buprenorphine hcl Swelling 12/11/2016   Bupropion Swelling and Other (See Comments) 04/29/2017   Codeine Itching, Nausea And Vomiting, Nausea Only, and Swelling 12/03/2014   Sulfamethoxazole-trimethoprim Swelling and Other (See Comments) 04/05/2022   Nsaids Other (See Comments) 05/30/2020   Pregabalin  Swelling 12/06/2009   Sulfa antibiotics Other (See Comments) 05/30/2020   Tapentadol Nausea And Vomiting 04/17/2023   Clindamycin Other (See Comments) 08/25/2015   Clindamycin/lincomycin Rash and Other (See Comments) 08/25/2015   Cymbalta  [duloxetine  hcl] Swelling 08/31/2011   Diclofenac Swelling 10/09/2010   Diclofenac sodium Swelling 10/09/2010   Gabapentin Swelling 12/06/2009   Oxycodone  Nausea Only 08/07/2021   Toradol  [ketorolac  tromethamine ] Rash 05/18/2016    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   Benign tumor of adrenal gland    Benign tumor of adrenal gland    Fibromyalgia    GERD (gastroesophageal  reflux disease)    Hiatal hernia    Hyperlipidemia    Hypertension    IBS (irritable bowel syndrome)    Migraines    S/P Nissen fundoplication (without gastrostomy tube) procedure    Sciatica    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    Family History  Problem Relation Age of Onset   Throat cancer Mother    Bladder Cancer Father    Liver cancer Father    Colon cancer Neg Hx     Social History   Socioeconomic History   Marital status: Married    Spouse name: Not on file   Number of children: Not on file   Years of education: Not on file   Highest education level: Not on file  Occupational History   Not on file  Tobacco Use   Smoking status: Every Day    Types: Cigarettes   Smokeless tobacco: Never   Tobacco comments:    1 cigarette daily   Vaping Use   Vaping status: Never Used  Substance and Sexual Activity   Alcohol use: No   Drug use: No   Sexual activity: Yes    Birth control/protection: Surgical  Other Topics Concern   Not on file  Social History Narrative   Not on file   Social Drivers of Health   Financial Resource Strain: Not on file  Food Insecurity: Food Insecurity Present (11/26/2023)   Hunger Vital Sign    Worried About Running Out of Food in the Last Year: Sometimes true    Ran Out of Food in the Last Year: Sometimes true  Transportation Needs: No Transportation Needs (11/26/2023)   PRAPARE - Administrator, Civil Service (Medical): No    Lack of Transportation (Non-Medical): No  Physical Activity: Not on file  Stress: Not on file  Social Connections: Not on file   Intimate Partner Violence: Not At Risk (11/26/2023)   Humiliation, Afraid, Rape, and Kick questionnaire    Fear of Current or Ex-Partner: No    Emotionally Abused: No    Physically Abused: No    Sexually Abused: No     Review of System:   General:  see hpi Eyes: Negative for vision changes. Loss of vision left eye chronic ENT: Negative for hoarseness,   nasal congestion.see hpi CV: Negative for chest pain, angina, palpitations, dyspnea on exertion, peripheral edema.  Respiratory: Negative for dyspnea at rest, dyspnea on exertion, cough, sputum, wheezing.  GI: See history of present illness. GU:  Negative for dysuria, hematuria, urinary incontinence, urinary frequency, nocturnal urination.  MS: Negative low back pain. Chronic right shoulder pain Derm: Negative for rash or itching.  Neuro: Negative for weakness, abnormal sensation, seizure, frequent headaches, memory loss, confusion.  Psych: Negative for anxiety, depression, suicidal ideation, hallucinations.  Endo: Negative for unusual weight change.  Heme: Negative for bruising or bleeding. Allergy: Negative for rash or hives.      Physical Examination:   Vital signs in last 24 hours: Temp:  [98.4 F (36.9 C)-99 F (37.2 C)] 98.5 F (36.9 C) (10/09 0430) Pulse Rate:  [76-109] 109 (10/09 0430) Resp:  [16-18] 18 (10/09 0430) BP: (133-207)/(49-107) 193/107 (10/09 0430) SpO2:  [95 %-100 %] 97 % (10/09 0430) Last BM Date : 11/26/23  General: thin does not appear to feel well. No acute distress.  Head: Normocephalic, atraumatic.   Eyes: Conjunctiva pale, no icterus. Mouth: Oropharyngeal mucosa moist and pink , no lesions erythema or exudate. Drool  from left side of mouth at times Neck: Supple without thyromegaly, masses, or lymphadenopathy.  Lungs: Clear to auscultation bilaterally.  Heart: Regular rate and rhythm, no murmurs rubs or gallops.  Abdomen: Bowel sounds are normal,  nondistended, no hepatosplenomegaly or masses, no  abdominal bruits or hernia , no rebound or guarding.  Mild epigastric tenderness Rectal: not performed Extremities: No lower extremity edema, clubbing, deformity.  Neuro: Alert and oriented x 4 , grossly normal neurologically.  Skin: Warm and dry, no rash or jaundice.   Psych: Alert and cooperative, normal mood and affect.        Intake/Output from previous day: 10/08 0701 - 10/09 0700 In: 46.2 [IV Piggyback:46.2] Out: -  Intake/Output this shift: Total I/O In: 46.2 [IV Piggyback:46.2] Out: -   Lab Results:   CBC Recent Labs    11/25/23 2008 11/26/23 0544 11/27/23 0510  WBC 10.8* 11.0* 15.1*  HGB 8.8* 7.7* 9.6*  HCT 28.5* 24.9* 31.4*  MCV 88.0 89.2 88.7  PLT 712* 537* 735*   BMET Recent Labs    11/25/23 2008 11/26/23 0544  NA 140 138  K 2.8* 3.2*  CL 101 103  CO2 23 24  GLUCOSE 99 77  BUN 7* <5*  CREATININE 0.79 0.59  CALCIUM  9.2 8.3*   LFT Recent Labs    11/25/23 2008 11/26/23 0544  BILITOT <0.2 <0.2  ALKPHOS 79 60  AST 20 15  ALT 9 8  PROT 6.3* 4.8*  ALBUMIN 3.5 2.7*    Lipase Recent Labs    11/25/23 2008  LIPASE <10*    PT/INR No results for input(s): LABPROT, INR in the last 72 hours.   Hepatitis Panel No results for input(s): HEPBSAG, HCVAB, HEPAIGM, HEPBIGM in the last 72 hours.   Imaging Studies:   CT Angio Chest PE W and/or Wo Contrast Result Date: 11/25/2023 CLINICAL DATA:  Pulmonary embolus suspected with high probability. Recent discharge from [facility] for low potassium, pneumonia, and bacterial infection of GI tract. Acute nonlocalized abdominal pain. EXAM: CT ANGIOGRAPHY CHEST CT ABDOMEN AND PELVIS WITH CONTRAST TECHNIQUE: Multidetector CT imaging of the chest was performed using the standard protocol during bolus administration of intravenous contrast. Multiplanar CT image reconstructions and MIPs were obtained to evaluate the vascular anatomy. Multidetector CT imaging of the abdomen and pelvis was performed  using the standard protocol during bolus administration of intravenous contrast. RADIATION DOSE REDUCTION: This exam was performed according to the departmental dose-optimization program which includes automated exposure control, adjustment of the mA and/or kV according to patient size and/or use of iterative reconstruction technique. CONTRAST:  OMNIPAQUE  IOHEXOL  350 MG/ML SOLN COMPARISON:  Chest radiograph 11/25/2023. CT abdomen and pelvis 09/20/2023. CT chest 07/05/2011 FINDINGS: CTA CHEST FINDINGS Cardiovascular: Technically adequate study with good opacification of the central and segmental pulmonary arteries. Moderate motion artifact. No focal filling defects. No evidence of significant pulmonary embolus. Cardiac enlargement. Small pericardial effusion. Normal caliber thoracic aorta. No aortic dissection. Great vessel origins are patent. Calcification of the aorta and coronary arteries. Mediastinum/Nodes: Large esophageal hiatal hernia. Esophagus is decompressed. No significant lymphadenopathy. Thyroid gland is unremarkable. Lungs/Pleura: Trace left pleural effusion. Atelectasis in the left base. No pneumothorax. Musculoskeletal: Postoperative changes in the cervical spine. Degenerative changes in the thoracic spine. No acute bony abnormalities. Review of the MIP images confirms the above findings. CT ABDOMEN and PELVIS FINDINGS Hepatobiliary: No focal liver abnormality is seen. Status post cholecystectomy. No biliary dilatation. Pancreas: Unremarkable. No pancreatic ductal dilatation or surrounding inflammatory changes.  Spleen: Normal in size without focal abnormality. Adrenals/Urinary Tract: Adrenal glands are unremarkable. Kidneys are normal, without renal calculi, focal lesion, or hydronephrosis. Bladder is unremarkable. Stomach/Bowel: Stomach, small bowel, and colon are not abnormally distended. Under distention limits evaluation but there is evidence of wall thickening in the rectosigmoid region  which likely indicates colitis. This may represent infectious or inflammatory etiologies. No abscess identified. Appendix is not visualized. Vascular/Lymphatic: Aortic atherosclerosis. No enlarged abdominal or pelvic lymph nodes. Reproductive: Status post hysterectomy. No adnexal masses. Other: No free air or free fluid in the abdomen. Abdominal wall musculature appears intact. Edema in the subcutaneous fat. Musculoskeletal: Lumbar scoliosis convex towards the left. Degenerative changes in the lumbar spine. No acute bony abnormalities. Review of the MIP images confirms the above findings. IMPRESSION: 1. No evidence of significant pulmonary embolus. 2. Large esophageal hiatal hernia. 3. Small left pleural effusion with basilar atelectasis. 4. Aortic atherosclerosis. 5. Wall thickening in the rectosigmoid colon suggesting colitis. No abscess. No proximal obstruction. Electronically Signed   By: Elsie Gravely M.D.   On: 11/25/2023 22:21   CT ABDOMEN PELVIS W CONTRAST Result Date: 11/25/2023 CLINICAL DATA:  Pulmonary embolus suspected with high probability. Recent discharge from [facility] for low potassium, pneumonia, and bacterial infection of GI tract. Acute nonlocalized abdominal pain. EXAM: CT ANGIOGRAPHY CHEST CT ABDOMEN AND PELVIS WITH CONTRAST TECHNIQUE: Multidetector CT imaging of the chest was performed using the standard protocol during bolus administration of intravenous contrast. Multiplanar CT image reconstructions and MIPs were obtained to evaluate the vascular anatomy. Multidetector CT imaging of the abdomen and pelvis was performed using the standard protocol during bolus administration of intravenous contrast. RADIATION DOSE REDUCTION: This exam was performed according to the departmental dose-optimization program which includes automated exposure control, adjustment of the mA and/or kV according to patient size and/or use of iterative reconstruction technique. CONTRAST:  OMNIPAQUE   IOHEXOL  350 MG/ML SOLN COMPARISON:  Chest radiograph 11/25/2023. CT abdomen and pelvis 09/20/2023. CT chest 07/05/2011 FINDINGS: CTA CHEST FINDINGS Cardiovascular: Technically adequate study with good opacification of the central and segmental pulmonary arteries. Moderate motion artifact. No focal filling defects. No evidence of significant pulmonary embolus. Cardiac enlargement. Small pericardial effusion. Normal caliber thoracic aorta. No aortic dissection. Great vessel origins are patent. Calcification of the aorta and coronary arteries. Mediastinum/Nodes: Large esophageal hiatal hernia. Esophagus is decompressed. No significant lymphadenopathy. Thyroid gland is unremarkable. Lungs/Pleura: Trace left pleural effusion. Atelectasis in the left base. No pneumothorax. Musculoskeletal: Postoperative changes in the cervical spine. Degenerative changes in the thoracic spine. No acute bony abnormalities. Review of the MIP images confirms the above findings. CT ABDOMEN and PELVIS FINDINGS Hepatobiliary: No focal liver abnormality is seen. Status post cholecystectomy. No biliary dilatation. Pancreas: Unremarkable. No pancreatic ductal dilatation or surrounding inflammatory changes. Spleen: Normal in size without focal abnormality. Adrenals/Urinary Tract: Adrenal glands are unremarkable. Kidneys are normal, without renal calculi, focal lesion, or hydronephrosis. Bladder is unremarkable. Stomach/Bowel: Stomach, small bowel, and colon are not abnormally distended. Under distention limits evaluation but there is evidence of wall thickening in the rectosigmoid region which likely indicates colitis. This may represent infectious or inflammatory etiologies. No abscess identified. Appendix is not visualized. Vascular/Lymphatic: Aortic atherosclerosis. No enlarged abdominal or pelvic lymph nodes. Reproductive: Status post hysterectomy. No adnexal masses. Other: No free air or free fluid in the abdomen. Abdominal wall musculature  appears intact. Edema in the subcutaneous fat. Musculoskeletal: Lumbar scoliosis convex towards the left. Degenerative changes in the lumbar spine. No acute bony  abnormalities. Review of the MIP images confirms the above findings. IMPRESSION: 1. No evidence of significant pulmonary embolus. 2. Large esophageal hiatal hernia. 3. Small left pleural effusion with basilar atelectasis. 4. Aortic atherosclerosis. 5. Wall thickening in the rectosigmoid colon suggesting colitis. No abscess. No proximal obstruction. Electronically Signed   By: Elsie Gravely M.D.   On: 11/25/2023 22:21   DG Chest Port 1 View Result Date: 11/25/2023 EXAM: 1 VIEW(S) XRAY OF THE CHEST 11/25/2023 08:15:00 PM COMPARISON: 07/06/2023 CLINICAL HISTORY: Questionable sepsis - evaluate for abnormality. FINDINGS: LUNGS AND PLEURA: No focal pulmonary opacity. No pulmonary edema. No pleural effusion. No pneumothorax. HEART AND MEDIASTINUM: Normal heart size and mediastinal contours. Atherosclerotic calcifications. BONES AND SOFT TISSUES: Cervical fusion hardware is noted. No acute osseous abnormality. DIAPHRAGM AND UPPER ABDOMEN: A small hiatal hernia is present. IMPRESSION: 1. No acute abnormalities. 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 She had been admitted at [facility] for 4 nights and treated for PNA and abdominal infection and hypokalemia, per EMR. Per patient she was discharged while actively vomiting. She went back to the ED with ongoing symptoms but treated in ED and sent  home. Patient presented to [facility] ED and admitted 11/25/23. GI consult today per family request for intractable N/V, colitis.    Chronic intractable N/V, epigastric pain/hematemesis/esophageal dysphagia: -symptoms for two months associated with 15 pound weight loss -Nissen fundoplication in 2011 -large esophageal hiatal hernia noted on CT imaging -remote EGD, as above -remote GES was normal in 2013 -reports coffee ground emesis and hematemesis with decline in Hgb -uses Excedrin  and daily ibuprofen  Symptoms could be multifactorial but would be concerned for mechanical issues with large hiatal hernia, Cameron ulcers, PUD, opioid induced gastroparesis, refractory GERD/esophageal stricture. Recommend EGD with possible esophageal dilation. Unfortunately she received Lovenox  today so we will have to postpone her procedure until tomorrow. Also her BP are not optimally controlled with systolic pressures currently in the 190s.   Colitis:  -possible rectosigmoid colon wall thickening on CT in setting of some underdistention -remote colonoscopy -chronically constipated entire life, goes two weeks without BM -some loose stool yesterday -I doubt she has infectious colitis -would advise colonoscopy as outpatient once her N/V AND constipation has been well managed. She would not be able to tolerate a bowel prep right now.  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 She is not taking anything at home, does not like laxatives -will try Linzess 145mcg daily  Anemia: -Hgb 12.2 (10/21/23)-->8.1 (11/12/23)-->Hgb 8.8 (11/25/23)-->Hgb 7.7 (11/26/23)-->Hgb 9.6 (11/27/23). She has not received blood transfusion. -Plt 735K -folate 14.1 -B12 245 -iron 20, TIBC 230, fe sat 9, ferritin 70 -BUN <5, Cre 0.69 -recent coffee ground emesis/hematemesis likely contributing -recommend EGD inpatient, colonoscopy outpatient -transfuse if Hgb <7   Plan:   Trend Hgb,  transfuse if <7. Hold Lovenox  until after EGD.  Linzess 145mcg daily. Agree with IV pantoprazole  40mg  BID. Clear liquids today. NPO after midnight. EGD with possible esophageal dilation tomorrow.  I have discussed the risks, alternatives, benefits with regards to but not limited to the risk of reaction  to medication, bleeding, infection, perforation and the patient is agreeable to proceed. Written consent to be obtained. Colonoscopy as outpatient once N/V and constipation are adequately controlled.     LOS: 2 days   We would like to thank you for the opportunity to participate in the care of Renee Gutierrez.  Sonny RAMAN. Ezzard RIGGERS [facility] [phone] 10/9/20255:30 AM

## 2023-11-27 NOTE — Plan of Care (Signed)

## 2023-11-27 NOTE — Progress Notes (Signed)
 Progress Note   Patient: Renee Gutierrez FMW:[id number] DOB: 11-21-1960 DOA: 11/25/2023     2 DOS: the patient was seen and examined on 11/27/2023   Brief hospital course: Renee Gutierrez is a 63 y.o. female with medical history significant of hypertension, hyperlipidemia, anxiety, GERD, chronic pain syndrome, trigeminal neuralgia who presents to the emergency department due to nausea, vomiting, abdominal pain and decreased oral intake.    Assessment and Plan: Hypokalemia- Due to GI losses. K+ is 2.8 on presentation. She got IV replacement with K 3.5 today. Continue gentle IV fluids with kcl.  Monitor daily renal function, electrolytes and replete accordingly.  Nausea and vomiting CT abdomen showed large hiatal hernia. Prior h/o fundoplication, GERD, ? Barrette's. She has been having symptoms since 1 month per daughter, lost weight. Has family h/o colon cancer, GI evaluated the patient, plan EGD tomorrow. Continue Compazine , reglan . NPO past midnight. Avoid zofran  due to prolonged QT.   Rectosigmoid colitis- Seen on CT abdomen pelvis. Gentle IV hydration. continue IV Zosyn therapy. Increased IV Dilaudid  1 mg q.4h p.r.n. for moderate to severe pain Continue IV Compazine  p.r.n. Continue clear liquid diet with plan for EGD tomorrow. Follow blood cultures.   Generalized weakness Debility, transient confusion- In the setting of severe GI symptoms, electrolyte abnormalities. Continue supportive care, fall precautions PT OT evaluation for discharge planning   Prolonged QT interval Initial EKG prolonged QT noted, repeat EKG improved Avoid QT prolonging drugs Continue to replete electrolytes as needed.   Esophageal hiatal hernia GERD- Continue Protonix . GI evaluated, plan for EGD tomorrow.   Essential hypertension- BP high in the setting of pain. Increased amlodipine  to 10mg , continue labetalol , Lisinopril .   Restless leg syndrome Continue ropinirole .   Trigeminal  neuralgia  Dilaudid  as needed.  At risk for malnutrition- BMI 18.3. Encourage and advance diet as tolerated.    Out of bed to chair. Incentive spirometry. Nursing supportive care. Fall, aspiration precautions. Diet:  Diet Orders (From admission, onward)     Start     Ordered   11/28/23 0001  Diet NPO time specified Except for: Sips with Meds  Diet effective midnight       Question:  Except for  Answer:  Sips with Meds   11/27/23 1057   11/27/23 1055  Diet clear liquid Fluid consistency: Thin  Diet effective now       Question:  Fluid consistency:  Answer:  Thin   11/27/23 1054           DVT prophylaxis: enoxaparin  (LOVENOX ) injection 30 mg Start: 11/29/23 1000 SCDs Start: 11/26/23 0411  Level of care: Telemetry   Code Status: Full Code  Subjective: Patient is seen and examined today morning.  She is lying in bed, has abdominal discomfort, nausea. BP elevated. RN at bedside giving her meds.  Physical Exam: Vitals:   11/26/23 1835 11/26/23 2021 11/27/23 0430 11/27/23 0917  BP: (!) 156/97 (!) 165/86 (!) 193/107 (!) 193/111  Pulse:  97 (!) 109 (!) 110  Resp:  18 18 18   Temp:  99 F (37.2 C) 98.5 F (36.9 C) 100 F (37.8 C)  TempSrc:  Oral  Oral  SpO2:  95% 97% 98%  Weight:        General - Elderly ill Caucasian thin built female, distress due to pain. HEENT - PERRLA, EOMI, atraumatic head, non tender sinuses. Lung - Clear, basal rales, no rhonchi, wheezes. Heart - S1, S2 heard, no murmurs, rubs, no pedal edema. Abdomen - Soft, diffuse  tender, no guarding, bowel sounds good Neuro - Alert, awake and oriented, non focal exam. Skin - Warm and dry.  Data Reviewed:      Latest Ref Rng & Units 11/27/2023    5:10 AM 11/26/2023    5:44 AM 11/25/2023    8:08 PM  CBC  WBC 4.0 - 10.5 K/uL 15.1  11.0  10.8   Hemoglobin 12.0 - 15.0 g/dL 9.6  7.7  8.8   Hematocrit 36.0 - 46.0 % 31.4  24.9  28.5   Platelets 150 - 400 K/uL 735  537  712       Latest Ref Rng & Units  11/27/2023    5:10 AM 11/26/2023    5:44 AM 11/25/2023    8:08 PM  BMP  Glucose 70 - 99 mg/dL 876  77  99   BUN 8 - 23 mg/dL <5  <5  7   Creatinine 0.44 - 1.00 mg/dL 9.30  9.40  9.20   Sodium 135 - 145 mmol/L 140  138  140   Potassium 3.5 - 5.1 mmol/L 3.5  3.2  2.8   Chloride 98 - 111 mmol/L 101  103  101   CO2 22 - 32 mmol/L 25  24  23    Calcium  8.9 - 10.3 mg/dL 7.6  8.3  9.2    CT Angio Chest PE W and/or Wo Contrast Result Date: 11/25/2023 CLINICAL DATA:  Pulmonary embolus suspected with high probability. Recent discharge from [facility] for low potassium, pneumonia, and bacterial infection of GI tract. Acute nonlocalized abdominal pain. EXAM: CT ANGIOGRAPHY CHEST CT ABDOMEN AND PELVIS WITH CONTRAST TECHNIQUE: Multidetector CT imaging of the chest was performed using the standard protocol during bolus administration of intravenous contrast. Multiplanar CT image reconstructions and MIPs were obtained to evaluate the vascular anatomy. Multidetector CT imaging of the abdomen and pelvis was performed using the standard protocol during bolus administration of intravenous contrast. RADIATION DOSE REDUCTION: This exam was performed according to the departmental dose-optimization program which includes automated exposure control, adjustment of the mA and/or kV according to patient size and/or use of iterative reconstruction technique. CONTRAST:  OMNIPAQUE  IOHEXOL  350 MG/ML SOLN COMPARISON:  Chest radiograph 11/25/2023. CT abdomen and pelvis 09/20/2023. CT chest 07/05/2011 FINDINGS: CTA CHEST FINDINGS Cardiovascular: Technically adequate study with good opacification of the central and segmental pulmonary arteries. Moderate motion artifact. No focal filling defects. No evidence of significant pulmonary embolus. Cardiac enlargement. Small pericardial effusion. Normal caliber thoracic aorta. No aortic dissection. Great vessel origins are patent. Calcification of the aorta and coronary arteries.  Mediastinum/Nodes: Large esophageal hiatal hernia. Esophagus is decompressed. No significant lymphadenopathy. Thyroid gland is unremarkable. Lungs/Pleura: Trace left pleural effusion. Atelectasis in the left base. No pneumothorax. Musculoskeletal: Postoperative changes in the cervical spine. Degenerative changes in the thoracic spine. No acute bony abnormalities. Review of the MIP images confirms the above findings. CT ABDOMEN and PELVIS FINDINGS Hepatobiliary: No focal liver abnormality is seen. Status post cholecystectomy. No biliary dilatation. Pancreas: Unremarkable. No pancreatic ductal dilatation or surrounding inflammatory changes. Spleen: Normal in size without focal abnormality. Adrenals/Urinary Tract: Adrenal glands are unremarkable. Kidneys are normal, without renal calculi, focal lesion, or hydronephrosis. Bladder is unremarkable. Stomach/Bowel: Stomach, small bowel, and colon are not abnormally distended. Under distention limits evaluation but there is evidence of wall thickening in the rectosigmoid region which likely indicates colitis. This may represent infectious or inflammatory etiologies. No abscess identified. Appendix is not visualized. Vascular/Lymphatic: Aortic atherosclerosis. No enlarged abdominal or pelvic  lymph nodes. Reproductive: Status post hysterectomy. No adnexal masses. Other: No free air or free fluid in the abdomen. Abdominal wall musculature appears intact. Edema in the subcutaneous fat. Musculoskeletal: Lumbar scoliosis convex towards the left. Degenerative changes in the lumbar spine. No acute bony abnormalities. Review of the MIP images confirms the above findings. IMPRESSION: 1. No evidence of significant pulmonary embolus. 2. Large esophageal hiatal hernia. 3. Small left pleural effusion with basilar atelectasis. 4. Aortic atherosclerosis. 5. Wall thickening in the rectosigmoid colon suggesting colitis. No abscess. No proximal obstruction. Electronically Signed   By: Elsie Gravely M.D.   On: 11/25/2023 22:21   CT ABDOMEN PELVIS W CONTRAST Result Date: 11/25/2023 CLINICAL DATA:  Pulmonary embolus suspected with high probability. Recent discharge from [facility] for low potassium, pneumonia, and bacterial infection of GI tract. Acute nonlocalized abdominal pain. EXAM: CT ANGIOGRAPHY CHEST CT ABDOMEN AND PELVIS WITH CONTRAST TECHNIQUE: Multidetector CT imaging of the chest was performed using the standard protocol during bolus administration of intravenous contrast. Multiplanar CT image reconstructions and MIPs were obtained to evaluate the vascular anatomy. Multidetector CT imaging of the abdomen and pelvis was performed using the standard protocol during bolus administration of intravenous contrast. RADIATION DOSE REDUCTION: This exam was performed according to the departmental dose-optimization program which includes automated exposure control, adjustment of the mA and/or kV according to patient size and/or use of iterative reconstruction technique. CONTRAST:  OMNIPAQUE  IOHEXOL  350 MG/ML SOLN COMPARISON:  Chest radiograph 11/25/2023. CT abdomen and pelvis 09/20/2023. CT chest 07/05/2011 FINDINGS: CTA CHEST FINDINGS Cardiovascular: Technically adequate study with good opacification of the central and segmental pulmonary arteries. Moderate motion artifact. No focal filling defects. No evidence of significant pulmonary embolus. Cardiac enlargement. Small pericardial effusion. Normal caliber thoracic aorta. No aortic dissection. Great vessel origins are patent. Calcification of the aorta and coronary arteries. Mediastinum/Nodes: Large esophageal hiatal hernia. Esophagus is decompressed. No significant lymphadenopathy. Thyroid gland is unremarkable. Lungs/Pleura: Trace left pleural effusion. Atelectasis in the left base. No pneumothorax. Musculoskeletal: Postoperative changes in the cervical spine. Degenerative changes in the thoracic spine. No acute bony abnormalities.  Review of the MIP images confirms the above findings. CT ABDOMEN and PELVIS FINDINGS Hepatobiliary: No focal liver abnormality is seen. Status post cholecystectomy. No biliary dilatation. Pancreas: Unremarkable. No pancreatic ductal dilatation or surrounding inflammatory changes. Spleen: Normal in size without focal abnormality. Adrenals/Urinary Tract: Adrenal glands are unremarkable. Kidneys are normal, without renal calculi, focal lesion, or hydronephrosis. Bladder is unremarkable. Stomach/Bowel: Stomach, small bowel, and colon are not abnormally distended. Under distention limits evaluation but there is evidence of wall thickening in the rectosigmoid region which likely indicates colitis. This may represent infectious or inflammatory etiologies. No abscess identified. Appendix is not visualized. Vascular/Lymphatic: Aortic atherosclerosis. No enlarged abdominal or pelvic lymph nodes. Reproductive: Status post hysterectomy. No adnexal masses. Other: No free air or free fluid in the abdomen. Abdominal wall musculature appears intact. Edema in the subcutaneous fat. Musculoskeletal: Lumbar scoliosis convex towards the left. Degenerative changes in the lumbar spine. No acute bony abnormalities. Review of the MIP images confirms the above findings. IMPRESSION: 1. No evidence of significant pulmonary embolus. 2. Large esophageal hiatal hernia. 3. Small left pleural effusion with basilar atelectasis. 4. Aortic atherosclerosis. 5. Wall thickening in the rectosigmoid colon suggesting colitis. No abscess. No proximal obstruction. Electronically Signed   By: Elsie Gravely M.D.   On: 11/25/2023 22:21   DG Chest Port 1 View Result Date: 11/25/2023 EXAM: 1 VIEW(S) XRAY OF THE  CHEST 11/25/2023 08:15:00 PM COMPARISON: 07/06/2023 CLINICAL HISTORY: Questionable sepsis - evaluate for abnormality. FINDINGS: LUNGS AND PLEURA: No focal pulmonary opacity. No pulmonary edema. No pleural effusion. No pneumothorax. HEART AND  MEDIASTINUM: Normal heart size and mediastinal contours. Atherosclerotic calcifications. BONES AND SOFT TISSUES: Cervical fusion hardware is noted. No acute osseous abnormality. DIAPHRAGM AND UPPER ABDOMEN: A small hiatal hernia is present. IMPRESSION: 1. No acute abnormalities. Electronically signed by: Andrea Gasman MD 11/25/2023 08:25 PM EDT RP Workstation: [id number]    Family Communication: Discussed with patient, she understand and agree. All questions answered.  Disposition: Status is: Inpatient Remains inpatient appropriate because: IV antibiotics, IV fluids, GI work up.  Planned Discharge Destination: Home with Home Health     Time spent: 43 minutes  Author: Concepcion Riser, MD 11/27/2023 11:08 AM Secure chat 7am to 7pm For on call review [email or web address].

## 2023-11-28 ENCOUNTER — Encounter (HOSPITAL_COMMUNITY)
Admission: EM | Disposition: A | Discharge status: Home / Self Care | Payer: Self-pay | Source: Home / Self Care | Attending: Internal Medicine

## 2023-11-28 ENCOUNTER — Encounter (HOSPITAL_COMMUNITY): Payer: Self-pay | Admitting: Certified Registered Nurse Anesthetist

## 2023-11-28 ENCOUNTER — Encounter (HOSPITAL_COMMUNITY): Payer: Self-pay | Admitting: Internal Medicine

## 2023-11-28 ENCOUNTER — Inpatient Hospital Stay (HOSPITAL_COMMUNITY): Payer: Self-pay | Admitting: Certified Registered Nurse Anesthetist

## 2023-11-28 DIAGNOSIS — K254 Chronic or unspecified gastric ulcer with hemorrhage: Secondary | ICD-10-CM | POA: Diagnosis not present

## 2023-11-28 DIAGNOSIS — K253 Acute gastric ulcer without hemorrhage or perforation: Secondary | ICD-10-CM

## 2023-11-28 DIAGNOSIS — K3189 Other diseases of stomach and duodenum: Secondary | ICD-10-CM

## 2023-11-28 DIAGNOSIS — K250 Acute gastric ulcer with hemorrhage: Secondary | ICD-10-CM

## 2023-11-28 DIAGNOSIS — E876 Hypokalemia: Secondary | ICD-10-CM | POA: Diagnosis not present

## 2023-11-28 DIAGNOSIS — K208 Other esophagitis without bleeding: Secondary | ICD-10-CM

## 2023-11-28 DIAGNOSIS — K2210 Ulcer of esophagus without bleeding: Secondary | ICD-10-CM

## 2023-11-28 DIAGNOSIS — R112 Nausea with vomiting, unspecified: Secondary | ICD-10-CM | POA: Diagnosis not present

## 2023-11-28 DIAGNOSIS — F17210 Nicotine dependence, cigarettes, uncomplicated: Secondary | ICD-10-CM

## 2023-11-28 DIAGNOSIS — I10 Essential (primary) hypertension: Secondary | ICD-10-CM

## 2023-11-28 DIAGNOSIS — K2970 Gastritis, unspecified, without bleeding: Secondary | ICD-10-CM

## 2023-11-28 DIAGNOSIS — K259 Gastric ulcer, unspecified as acute or chronic, without hemorrhage or perforation: Secondary | ICD-10-CM

## 2023-11-28 DIAGNOSIS — K529 Noninfective gastroenteritis and colitis, unspecified: Secondary | ICD-10-CM | POA: Diagnosis not present

## 2023-11-28 HISTORY — PX: ESOPHAGEAL DILATION: SHX303

## 2023-11-28 HISTORY — PX: ESOPHAGOGASTRODUODENOSCOPY: SHX5428

## 2023-11-28 LAB — CBC
HCT: 31.3 % — ABNORMAL LOW (ref 36.0–46.0)
Hemoglobin: 9.6 g/dL — ABNORMAL LOW (ref 12.0–15.0)
MCH: 27.2 pg (ref 26.0–34.0)
MCHC: 30.7 g/dL (ref 30.0–36.0)
MCV: 88.7 fL (ref 80.0–100.0)
Platelets: 823 K/uL — ABNORMAL HIGH (ref 150–400)
RBC: 3.53 MIL/uL — ABNORMAL LOW (ref 3.87–5.11)
RDW: 14.6 % (ref 11.5–15.5)
WBC: 14.7 K/uL — ABNORMAL HIGH (ref 4.0–10.5)
nRBC: 0 % (ref 0.0–0.2)

## 2023-11-28 LAB — BASIC METABOLIC PANEL WITH GFR
Anion gap: 16 — ABNORMAL HIGH (ref 5–15)
BUN: 6 mg/dL — ABNORMAL LOW (ref 8–23)
CO2: 23 mmol/L (ref 22–32)
Calcium: 9.2 mg/dL (ref 8.9–10.3)
Chloride: 101 mmol/L (ref 98–111)
Creatinine, Ser: 0.68 mg/dL (ref 0.44–1.00)
GFR, Estimated: >60 mL/min (ref >60)
Glucose, Bld: 122 mg/dL — ABNORMAL HIGH (ref 70–99)
Potassium: 3.5 mmol/L (ref 3.5–5.1)
Sodium: 140 mmol/L (ref 135–145)

## 2023-11-28 SURGERY — EGD (ESOPHAGOGASTRODUODENOSCOPY)
Anesthesia: General

## 2023-11-28 MED ORDER — LIDOCAINE 2% (20 MG/ML) 5 ML SYRINGE
INTRAMUSCULAR | Status: DC | PRN
Start: 2023-11-28 — End: 2023-11-28
  Administered 2023-11-28: 60 mg via INTRAVENOUS

## 2023-11-28 MED ORDER — SUCRALFATE 1 GM/10ML PO SUSP
1.0000 g | Freq: Four times a day (QID) | ORAL | Status: DC
  Administered 2023-11-28 – 2023-11-29 (×3): 1 g via ORAL
  Filled 2023-11-28 (×4): qty 10

## 2023-11-28 MED ORDER — LACTATED RINGERS IV SOLN: INTRAVENOUS | Status: DC | PRN

## 2023-11-28 MED ORDER — POTASSIUM CHLORIDE 20 MEQ PO PACK
40.0000 meq | PACK | Freq: Every day | ORAL | Status: DC
  Administered 2023-11-28 – 2023-11-29 (×2): 40 meq via ORAL
  Filled 2023-11-28 (×2): qty 2

## 2023-11-28 MED ORDER — METRONIDAZOLE 500 MG PO TABS
500.0000 mg | ORAL_TABLET | Freq: Two times a day (BID) | ORAL | Status: DC
  Administered 2023-11-28 – 2023-11-29 (×2): 500 mg via ORAL
  Filled 2023-11-28 (×2): qty 1

## 2023-11-28 MED ORDER — PANTOPRAZOLE SODIUM 40 MG IV SOLR
40.0000 mg | Freq: Two times a day (BID) | INTRAVENOUS | Status: DC
  Administered 2023-11-28 – 2023-11-29 (×2): 40 mg via INTRAVENOUS
  Filled 2023-11-28 (×3): qty 10

## 2023-11-28 MED ORDER — PROPOFOL 10 MG/ML IV BOLUS
INTRAVENOUS | Status: DC | PRN
  Administered 2023-11-28: 80 mg via INTRAVENOUS
  Administered 2023-11-28 (×3): 30 mg via INTRAVENOUS

## 2023-11-28 MED ORDER — EPINEPHRINE 1 MG/10ML IV SOSY
PREFILLED_SYRINGE | INTRAVENOUS | Status: AC
  Filled 2023-11-28: qty 10

## 2023-11-28 MED ORDER — SODIUM CHLORIDE (PF) 0.9 % IJ SOLN
INTRAMUSCULAR | Status: DC | PRN
  Administered 2023-11-28: 2 mL

## 2023-11-28 MED ORDER — HYDROMORPHONE HCL 1 MG/ML IJ SOLN
0.5000 mg | INTRAMUSCULAR | Status: DC | PRN
  Administered 2023-11-28 – 2023-11-29 (×4): 0.5 mg via INTRAVENOUS
  Filled 2023-11-28 (×4): qty 0.5

## 2023-11-28 NOTE — Care Management Important Message (Signed)
 Important Message  Patient Details  Name: Renee Gutierrez MRN: [id number] Date of Birth: July 25, 1960   Important Message Given:  Yes - Medicare IM     Wilfred Dayrit L Jarah Pember 11/28/2023, 4:21 PM

## 2023-11-28 NOTE — Anesthesia Preprocedure Evaluation (Signed)
 Anesthesia Evaluation  Patient identified by MRN, date of birth, ID band Patient awake    Reviewed: Allergy & Precautions, H&P , NPO status , Patient's Chart, lab work & pertinent test results  Airway Mallampati: II  TM Distance: >3 FB Neck ROM: Full    Dental no notable dental hx.    Pulmonary asthma , Current Smoker and Patient abstained from smoking.   Pulmonary exam normal breath sounds clear to auscultation       Cardiovascular hypertension, Normal cardiovascular exam Rhythm:Regular Rate:Normal     Neuro/Psych  Headaches PSYCHIATRIC DISORDERS Anxiety Depression     Neuromuscular disease    GI/Hepatic Neg liver ROS, hiatal hernia,GERD  ,,  Endo/Other  negative endocrine ROS    Renal/GU Renal disease  negative genitourinary   Musculoskeletal  (+) Arthritis ,  Fibromyalgia -  Abdominal   Peds negative pediatric ROS (+)  Hematology  (+) Blood dyscrasia, anemia   Anesthesia Other Findings   Reproductive/Obstetrics negative OB ROS                              Anesthesia Physical Anesthesia Plan  ASA: 3  Anesthesia Plan: General   Post-op Pain Management:    Induction: Intravenous  PONV Risk Score and Plan:   Airway Management Planned: Nasal Cannula  Additional Equipment:   Intra-op Plan:   Post-operative Plan:   Informed Consent: I have reviewed the patients History and Physical, chart, labs and discussed the procedure including the risks, benefits and alternatives for the proposed anesthesia with the patient or authorized representative who has indicated his/her understanding and acceptance.     Dental advisory given  Plan Discussed with: CRNA  Anesthesia Plan Comments:         Anesthesia Quick Evaluation

## 2023-11-28 NOTE — Anesthesia Postprocedure Evaluation (Signed)
 Anesthesia Post Note  Patient: Renee Gutierrez  Procedure(s) Performed: EGD (ESOPHAGOGASTRODUODENOSCOPY) DILATION, ESOPHAGUS  Patient location during evaluation: PACU Anesthesia Type: General Level of consciousness: awake and alert Pain management: pain level controlled Vital Signs Assessment: post-procedure vital signs reviewed and stable Respiratory status: spontaneous breathing, nonlabored ventilation, respiratory function stable and patient connected to nasal cannula oxygen Cardiovascular status: blood pressure returned to baseline and stable Postop Assessment: no apparent nausea or vomiting Anesthetic complications: no   No notable events documented.   Last Vitals:  Vitals:   11/28/23 1317 11/28/23 1324  BP: (!) 149/72   Pulse: (!) 105 (!) 101  Resp: (!) 21 17  Temp:    SpO2: 100% 100%    Last Pain:  Vitals:   11/28/23 1313  TempSrc:   PainSc: Asleep                 Andrea Limes

## 2023-11-28 NOTE — Progress Notes (Addendum)
 Progress Note   Patient: Renee Gutierrez FMW:[id number] DOB: 18-Jul-1960 DOA: 11/25/2023     3 DOS: the patient was seen and examined on 11/28/2023   Brief hospital course: ASHANTA AMOROSO is a 63 y.o. female with medical history significant of hypertension, hyperlipidemia, anxiety, GERD, chronic pain syndrome, trigeminal neuralgia who presents to the emergency department due to nausea, vomiting, abdominal pain and decreased oral intake.    EGD done 11/28/23 shows large ulcer in the first portion of her stomach just distal to the esophagus. This is most likely due to her NSAID use, h pylori sample sent. GI advised IV PPI twice daily, Carafate 4 times daily. Clear liquid diet today.   Assessment and Plan: Intractable nausea and vomiting- Gastric ulcer- CT abdomen showed large hiatal hernia. Prior h/o fundoplication, GERD, ? Barrette's. She has been having symptoms since 1 month per daughter, lost weight. EGD 11/28/23 - shows large ulcer in the first portion of her stomach just distal to the esophagus. This is most likely due to her NSAID use, h pylori sample sent. GI advised IV PPI twice daily, Carafate 4 times daily. Clear liquid diet today.  Continue Compazine , reglan . NPO past midnight. Avoid zofran  due to prolonged QT.  Hypokalemia- Due to GI losses. K+ is 2.8 on presentation. She got IV replacement with K 3.5 today. Continue gentle IV fluids with kcl.  Monitor daily renal function, electrolytes and replete accordingly.   Rectosigmoid colitis- Seen on CT abdomen pelvis. Will stop IV Zosyn therapy. Will give flagyl oral tx. IV Dilaudid  0.5 mg q.4h p.r.n. for moderate to severe pain Continue IV Compazine  p.r.n. Continue clear liquid diet. Follow blood cultures.   Generalized weakness Debility, transient confusion- In the setting of severe GI symptoms, electrolyte abnormalities. Continue supportive care, fall precautions PT OT evaluation for discharge planning   Prolonged QT  interval Initial EKG prolonged QT noted, repeat EKG improved Avoid QT prolonging drugs Continue to replete electrolytes as needed.   Esophageal hiatal hernia GERD- Continue Protonix . GI evaluated, plan for EGD tomorrow.   Essential hypertension- BP improved today. Continue amlodipine  10mg , labetalol , Lisinopril . IV hydralazine  for high BP PRN.   Restless leg syndrome Continue ropinirole .   Trigeminal neuralgia  Dilaudid  as needed.  At risk for malnutrition- BMI 18.3. Encourage and advance diet as tolerated.    Out of bed to chair. Incentive spirometry. Nursing supportive care. Fall, aspiration precautions. Diet:  Diet Orders (From admission, onward)     Start     Ordered   11/28/23 1329  Diet clear liquid Fluid consistency: Thin  Diet effective now       Question:  Fluid consistency:  Answer:  Thin   11/28/23 1328           DVT prophylaxis: enoxaparin  (LOVENOX ) injection 30 mg Start: 11/29/23 1000 SCDs Start: 11/26/23 0411  Level of care: Telemetry   Code Status: Full Code  Subjective: Patient is seen and examined today morning.  She is lying in bed, has headache. States she has trigeminal neuralgia and sees pain clinic, on dilaudid  oral at home.  Physical Exam: Vitals:   11/28/23 1317 11/28/23 1324 11/28/23 1330 11/28/23 1443  BP: (!) 149/72  (!) 155/76 (!) 157/77  Pulse: (!) 105 (!) 101 (!) 104 (!) 112  Resp: (!) 21 17 (!) 23   Temp:    98.6 F (37 C)  TempSrc:    Oral  SpO2: 100% 100% 100% 97%  Weight:  General - Elderly ill Caucasian thin built female, distress due to headache. HEENT - PERRLA, EOMI, atraumatic head, non tender sinuses. Lung - Clear, basal rales, no rhonchi, wheezes. Heart - S1, S2 heard, no murmurs, rubs, no pedal edema. Abdomen - Soft, diffuse tender, no guarding, bowel sounds good Neuro - Alert, awake and oriented, non focal exam. Skin - Warm and dry.  Data Reviewed:      Latest Ref Rng & Units 11/28/2023     5:25 AM 11/27/2023    5:10 AM 11/26/2023    5:44 AM  CBC  WBC 4.0 - 10.5 K/uL 14.7  15.1  11.0   Hemoglobin 12.0 - 15.0 g/dL 9.6  9.6  7.7   Hematocrit 36.0 - 46.0 % 31.3  31.4  24.9   Platelets 150 - 400 K/uL 823  735  537       Latest Ref Rng & Units 11/28/2023    5:25 AM 11/27/2023    5:10 AM 11/26/2023    5:44 AM  BMP  Glucose 70 - 99 mg/dL 877  876  77   BUN 8 - 23 mg/dL 6  <5  <5   Creatinine 0.44 - 1.00 mg/dL 9.31  9.30  9.40   Sodium 135 - 145 mmol/L 140  140  138   Potassium 3.5 - 5.1 mmol/L 3.5  3.5  3.2   Chloride 98 - 111 mmol/L 101  101  103   CO2 22 - 32 mmol/L 23  25  24    Calcium  8.9 - 10.3 mg/dL 9.2  7.6  8.3    No results found.   Family Communication: Discussed with patient, she understand and agree. All questions answered.  Disposition: Status is: Inpatient Remains inpatient appropriate because: advance diet, IV PPI.  Planned Discharge Destination: Home with Home Health     Time spent: 45 minutes  Author: Concepcion Riser, MD 11/28/2023 3:40 PM Secure chat 7am to 7pm For on call review [email or web address].

## 2023-11-28 NOTE — H&P (Signed)
 Primary Care Physician:  Patient, No Pcp Per Primary Gastroenterologist:  Dr. Cindie  Pre-Procedure History & Physical: HPI:  Renee Gutierrez is a 63 y.o. female is here for an EGD with possible dilation due to epigastric pain, N/V, dysphagia, Anemia.   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    Prior to Admission medications   Medication Sig Start Date End Date Taking? Authorizing Provider  amLODipine  (NORVASC ) 5 MG tablet Take 5 mg by mouth daily. 11/24/23  Yes [provider]  aspirin -acetaminophen -caffeine  (EXCEDRIN  MIGRAINE) 250-250-65 MG tablet Take 2 tablets by mouth daily as needed for migraine or headache. 05/24/14  Yes [provider]  cefdinir (OMNICEF) 300 MG capsule Take 300 mg by mouth 2 (two) times daily. 11/24/23  Yes [provider]  diclofenac Sodium (VOLTAREN) 1 % GEL Apply 2 g topically 4 (four) times daily. 04/18/20  Yes [provider]  DULoxetine  (CYMBALTA ) 60 MG capsule Take 60 mg by mouth 2 (two) times daily. 05/16/20  Yes [provider]  erythromycin  ophthalmic ointment Place 1 Application into the left eye 3 (three) times daily.   Yes  [provider]  labetalol  (NORMODYNE ) 200 MG tablet Take 200 mg by mouth 2 (two) times daily.   Yes [provider]  lisinopril  (PRINIVIL ,ZESTRIL ) 40 MG tablet Take 40 mg by mouth daily. 01/31/14 11/25/23 Yes [provider]  metroNIDAZOLE (FLAGYL) 250 MG tablet Take 500 mg by mouth 3 (three) times daily. 11/24/23  Yes [provider]  morphine  (MSIR) 15 MG tablet Take 15 mg by mouth every 4 (four) hours as needed for moderate pain (pain score 4-6). 11/05/23  Yes [provider]  mupirocin ointment (BACTROBAN) 2 % Apply 1 Application topically 3 (three) times daily. 03/25/16  Yes [provider]  naloxone (NARCAN) nasal spray 4 mg/0.1 mL Place 1 spray into the nose once. 09/17/18  Yes [provider]  ondansetron  (ZOFRAN -ODT) 8 MG disintegrating tablet Take 8 mg by mouth every 6 (six) hours as needed for nausea or vomiting. 11/24/23  Yes [provider]  orphenadrine  (NORFLEX ) 100 MG tablet Take 100 mg by mouth at bedtime. 10/06/19  Yes [provider]  pantoprazole  (PROTONIX ) 40 MG tablet Take 1 tablet (40 mg total) by mouth daily. 11/03/23  Yes Johnson, Clanford L, MD  prednisoLONE  acetate (PRED FORTE ) 1 % ophthalmic suspension Place 1 drop into the left eye in the morning and at  bedtime. 03/01/20  Yes [provider]  rOPINIRole  (REQUIP ) 2 MG tablet Take 2-4 mg by mouth See admin instructions. 2 mg am, 4 mg pm 11/11/14  Yes [provider]  simethicone (GAS RELIEF EXTRA STRENGTH) 125 MG chewable tablet Chew 125 mg by mouth daily as needed for flatulence.  05/24/14  Yes [provider]  Vitamin D , Ergocalciferol , (DRISDOL ) 1.25 MG (50000 UNIT) CAPS capsule Take 50,000 Units by mouth every Saturday. 05/10/23  Yes [provider]    Allergies as of 11/25/2023 - Review Complete 11/25/2023  Allergen Reaction Noted   Buprenorphine hcl Swelling 12/11/2016   Bupropion Swelling and Other (See  Comments) 04/29/2017   Codeine Itching, Nausea And Vomiting, Nausea Only, and Swelling 12/03/2014   Sulfamethoxazole-trimethoprim Swelling and Other (See Comments) 04/05/2022   Nsaids Other (See Comments) 05/30/2020   Pregabalin  Swelling 12/06/2009   Sulfa antibiotics Other (See Comments) 05/30/2020   Tapentadol Nausea And Vomiting 04/17/2023   Clindamycin Other (See Comments) 08/25/2015   Clindamycin/lincomycin Rash and Other (See Comments) 08/25/2015   Cymbalta  [duloxetine  hcl] Swelling 08/31/2011   Diclofenac Swelling 10/09/2010   Diclofenac sodium Swelling 10/09/2010   Gabapentin Swelling 12/06/2009   Oxycodone  Nausea Only 08/07/2021   Toradol  [ketorolac  tromethamine ] Rash 05/18/2016    Family History  Problem Relation Age of Onset   Throat cancer Mother    Bladder Cancer Father    Liver cancer Father    Colon cancer Neg Hx     Social History   Socioeconomic History   Marital status: Married    Spouse name: Not on file   Number of children: Not on file   Years of education: Not on file   Highest education level: Not on file  Occupational History   Not on file  Tobacco Use   Smoking status: Every Day    Types: Cigarettes   Smokeless tobacco: Never   Tobacco comments:    1 cigarette daily   Vaping Use   Vaping status: Never Used  Substance and Sexual Activity   Alcohol use: No   Drug use: No   Sexual activity: Yes    Birth control/protection: Surgical  Other Topics Concern   Not on file  Social History Narrative   Not on file   Social Drivers of Health   Financial Resource Strain: Not on file  Food Insecurity: Food Insecurity Present (11/26/2023)   Hunger Vital Sign    Worried About Running Out of Food in the Last Year: Sometimes true    Ran Out of Food in the Last Year: Sometimes true  Transportation Needs: No Transportation Needs (11/26/2023)   PRAPARE - Administrator, Civil Service (Medical): No    Lack of Transportation (Non-Medical): No   Physical Activity: Not on file  Stress: Not on file  Social Connections: Not on file  Intimate Partner Violence: Not At Risk (11/26/2023)   Humiliation, Afraid, Rape, and Kick questionnaire    Fear of Current or Ex-Partner: No    Emotionally Abused: No    Physically Abused: No    Sexually Abused: No    Review of Systems: General: Negative for fever, chills, fatigue, weakness. Eyes: Negative for vision changes.  ENT: Negative for hoarseness, difficulty swallowing , nasal congestion. CV: Negative for chest pain, angina, palpitations, dyspnea on exertion, peripheral edema.  Respiratory: Negative for dyspnea at rest, dyspnea on exertion, cough, sputum, wheezing.  GI: See history of present illness. GU:  Negative for dysuria, hematuria, urinary incontinence, urinary  frequency, nocturnal urination.  MS: Negative for joint pain, low back pain.  Derm: Negative for rash or itching.  Neuro: Negative for weakness, abnormal sensation, seizure, frequent headaches, memory loss, confusion.  Psych: Negative for anxiety, depression Endo: Negative for unusual weight change.  Heme: Negative for bruising or bleeding. Allergy: Negative for rash or hives.  Physical Exam: Vital signs in last 24 hours: Temp:  [98.5 F (36.9 C)-100 F (37.8 C)] 98.5 F (36.9 C) (10/10 0606) Pulse Rate:  [113-128] 121 (10/10 0606) Resp:  [16-20] 16 (10/09 2113) BP: (124-193)/(70-100) 154/80 (10/10 0606) SpO2:  [97 %-99 %] 98 % (10/10 0606) Last BM Date : 11/26/23 General:   Alert,  Well-developed, well-nourished, pleasant and cooperative in NAD Head:  Normocephalic and atraumatic. Eyes:  Sclera clear, no icterus.   Conjunctiva pink. Ears:  Normal auditory acuity. Nose:  No deformity, discharge,  or lesions. Msk:  Symmetrical without gross deformities. Normal posture. Extremities:  Without clubbing or edema. Neurologic:  Alert and  oriented x4;  grossly normal neurologically. Skin:  Intact without significant  lesions or rashes. Psych:  Alert and cooperative. Normal mood and affect.   Impression/Plan: Renee Gutierrez is here for an EGD with possible dilation due to epigastric pain, N/V, dysphagia, Anemia.   Risks, benefits, limitations, imponderables and alternatives regarding procedure have been reviewed with the patient. Questions have been answered. All parties agreeable.

## 2023-11-28 NOTE — Op Note (Signed)
 [facility] Patient Name: Renee Gutierrez Procedure Date: 11/28/2023 12:09 PM MRN: [id number] Date of Birth: [birth year] Attending MD: Carlin POUR. Cindie , [place], [id number] CSN: [id number] Age: 63 Admit Type: Outpatient Procedure:                Upper GI endoscopy Indications:              Epigastric abdominal pain, Acute post hemorrhagic                            anemia, Dysphagia Providers:                Carlin POUR. Cindie, DO, Crystal Page, Bascom Blush Referring MD:              Medicines:                See the Anesthesia note for documentation of the                            administered medications Complications:            No immediate complications. Estimated Blood Loss:     Estimated blood loss was minimal. Procedure:                Pre-Anesthesia Assessment:                           - The anesthesia plan was to use monitored                            anesthesia care (MAC).                           After obtaining informed consent, the endoscope was                            passed under direct vision. Throughout the                            procedure, the patient's blood pressure, pulse, and                            oxygen saturations were monitored continuously. The                            [id number] ([id number]) Upper was introduced through                            the mouth, and advanced to the second part of                            duodenum. The upper GI endoscopy was accomplished                            without difficulty. The patient tolerated the                            procedure well.  Scope In: 12:56:06 PM Scope Out: 1:09:50 PM Total Procedure Duration: 0 hours 13 minutes 44 seconds  Findings:      LA Grade D (one or more mucosal breaks involving at least 75% of       esophageal circumference) esophagitis with no bleeding was found in the       lower third of the esophagus.      One non-bleeding cratered gastric ulcer with a flat pigmented  spot       (Forrest Class IIc) was found in the cardia. The lesion was 20 mm in       largest dimension. Area was successfully injected with 2 mL of a 0.1       mg/mL solution of epinephrine for drug delivery. Ulcer bed then coated       with PuraStat.      Diffuse moderate inflammation characterized by erosions, erythema and       friability was found in the entire examined stomach. Biopsies were taken       with a cold forceps for Helicobacter pylori testing.      The duodenal bulb, first portion of the duodenum and second portion of       the duodenum were normal.      A medium-sized hiatal hernia was present. Impression:               - LA Grade D erosive esophagitis with no bleeding.                           - Non-bleeding gastric ulcer with a flat pigmented                            spot (Forrest Class IIc). Injected.                           - Gastritis. Biopsied.                           - Normal duodenal bulb, first portion of the                            duodenum and second portion of the duodenum. Moderate Sedation:      Per Anesthesia Care Recommendation:           - Return patient to hospital ward for ongoing care.                           - Clear liquid diet.                           - Continue on IV PPI BID                           - Use sucralfate suspension 1 gram PO QID.                           - No ibuprofen, naproxen , or other non-steroidal                            anti-inflammatory drugs.                           -  Montior H&H and transfuse for <7 or clinically                            warranted.                           - Repeat upper endoscopy in 8 weeks to evaluate the                            response to therapy. Procedure Code(s):        --- Professional ---                           [phone], Esophagogastroduodenoscopy, flexible,                            transoral; with biopsy, single or multiple                           43236, 59,  Esophagogastroduodenoscopy, flexible,                            transoral; with directed submucosal injection(s),                            any substance Diagnosis Code(s):        --- Professional ---                           K20.80, Other esophagitis without bleeding                           K25.9, Gastric ulcer, unspecified as acute or                            chronic, without hemorrhage or perforation                           K29.70, Gastritis, unspecified, without bleeding                           R10.13, Epigastric pain                           D62, Acute posthemorrhagic anemia                           R13.10, Dysphagia, unspecified CPT copyright 2022 [employer]. All rights reserved. The codes documented in this report are preliminary and upon coder review may  be revised to meet current compliance requirements. Carlin POUR. Cindie, DO Carlin POUR. Winnell Bento, DO 11/28/2023 1:23:20 PM This report has been signed electronically. Number of Addenda: 0

## 2023-11-28 NOTE — Transfer of Care (Signed)
 Immediate Anesthesia Transfer of Care Note  Patient: Renee Gutierrez  Procedure(s) Performed: EGD (ESOPHAGOGASTRODUODENOSCOPY) DILATION, ESOPHAGUS  Patient Location: PACU  Anesthesia Type:General  Level of Consciousness: drowsy  Airway & Oxygen Therapy: Patient Spontanous Breathing  Post-op Assessment: Report given to RN and Post -op Vital signs reviewed and stable  Post vital signs: Reviewed and stable  Last Vitals:  Vitals Value Taken Time  BP 145/69   Temp 98.5   Pulse 105   Resp 22   SpO2 100%     Last Pain:  Vitals:   11/28/23 1255  TempSrc:   PainSc: 9          Complications: No notable events documented.

## 2023-11-29 ENCOUNTER — Other Ambulatory Visit: Payer: Self-pay | Admitting: Internal Medicine

## 2023-11-29 ENCOUNTER — Encounter (HOSPITAL_COMMUNITY): Payer: Self-pay

## 2023-11-29 ENCOUNTER — Other Ambulatory Visit: Payer: Self-pay

## 2023-11-29 ENCOUNTER — Emergency Department (HOSPITAL_COMMUNITY)
Admission: EM | Admit: 2023-11-29 | Discharge: 2023-11-29 | Disposition: A | Discharge status: Home / Self Care | Attending: Emergency Medicine | Admitting: Emergency Medicine

## 2023-11-29 ENCOUNTER — Telehealth: Payer: Self-pay | Admitting: Internal Medicine

## 2023-11-29 DIAGNOSIS — K279 Peptic ulcer, site unspecified, unspecified as acute or chronic, without hemorrhage or perforation: Secondary | ICD-10-CM | POA: Insufficient documentation

## 2023-11-29 DIAGNOSIS — R519 Headache, unspecified: Secondary | ICD-10-CM | POA: Diagnosis present

## 2023-11-29 DIAGNOSIS — D62 Acute posthemorrhagic anemia: Secondary | ICD-10-CM

## 2023-11-29 DIAGNOSIS — K250 Acute gastric ulcer with hemorrhage: Secondary | ICD-10-CM

## 2023-11-29 DIAGNOSIS — R000 Tachycardia, unspecified: Secondary | ICD-10-CM | POA: Insufficient documentation

## 2023-11-29 DIAGNOSIS — G500 Trigeminal neuralgia: Secondary | ICD-10-CM | POA: Insufficient documentation

## 2023-11-29 DIAGNOSIS — K449 Diaphragmatic hernia without obstruction or gangrene: Secondary | ICD-10-CM | POA: Diagnosis not present

## 2023-11-29 DIAGNOSIS — E876 Hypokalemia: Secondary | ICD-10-CM | POA: Diagnosis not present

## 2023-11-29 LAB — BASIC METABOLIC PANEL WITH GFR
Anion gap: 12 (ref 5–15)
BUN: 8 mg/dL (ref 8–23)
CO2: 23 mmol/L (ref 22–32)
Calcium: 9.5 mg/dL (ref 8.9–10.3)
Chloride: 103 mmol/L (ref 98–111)
Creatinine, Ser: 0.75 mg/dL (ref 0.44–1.00)
GFR, Estimated: >60 mL/min (ref >60)
Glucose, Bld: 110 mg/dL — ABNORMAL HIGH (ref 70–99)
Potassium: 3.2 mmol/L — ABNORMAL LOW (ref 3.5–5.1)
Sodium: 138 mmol/L (ref 135–145)

## 2023-11-29 LAB — CBC
HCT: 31.3 % — ABNORMAL LOW (ref 36.0–46.0)
Hemoglobin: 9.8 g/dL — ABNORMAL LOW (ref 12.0–15.0)
MCH: 27.4 pg (ref 26.0–34.0)
MCHC: 31.3 g/dL (ref 30.0–36.0)
MCV: 87.4 fL (ref 80.0–100.0)
Platelets: 865 K/uL — ABNORMAL HIGH (ref 150–400)
RBC: 3.58 MIL/uL — ABNORMAL LOW (ref 3.87–5.11)
RDW: 14.5 % (ref 11.5–15.5)
WBC: 12.9 K/uL — ABNORMAL HIGH (ref 4.0–10.5)
nRBC: 0 % (ref 0.0–0.2)

## 2023-11-29 MED ORDER — SUCRALFATE 1 GM/10ML PO SUSP: 1.0000 g | Freq: Four times a day (QID) | ORAL | 0 refills | Status: DC

## 2023-11-29 MED ORDER — POTASSIUM CHLORIDE CRYS ER 20 MEQ PO TBCR: 40.0000 meq | EXTENDED_RELEASE_TABLET | Freq: Every day | ORAL | Status: DC

## 2023-11-29 MED ORDER — SODIUM CHLORIDE 0.9 % IV SOLN: INTRAVENOUS | Status: DC

## 2023-11-29 MED ORDER — LABETALOL HCL 200 MG PO TABS
200.0000 mg | ORAL_TABLET | Freq: Once | ORAL | Status: AC
  Administered 2023-11-29: 200 mg via ORAL
  Filled 2023-11-29: qty 1

## 2023-11-29 MED ORDER — POTASSIUM CHLORIDE CRYS ER 20 MEQ PO TBCR: 40.0000 meq | EXTENDED_RELEASE_TABLET | Freq: Every day | ORAL | 0 refills | Status: DC

## 2023-11-29 MED ORDER — PANTOPRAZOLE SODIUM 40 MG IV SOLR
40.0000 mg | INTRAVENOUS | Status: AC
  Administered 2023-11-29: 40 mg via INTRAVENOUS
  Filled 2023-11-29: qty 10

## 2023-11-29 MED ORDER — ALUM & MAG HYDROXIDE-SIMETH 200-200-20 MG/5ML PO SUSP
30.0000 mL | Freq: Once | ORAL | Status: AC
  Administered 2023-11-29: 30 mL via ORAL
  Filled 2023-11-29: qty 30

## 2023-11-29 MED ORDER — FERROUS SULFATE 325 (65 FE) MG PO TABS: 325.0000 mg | ORAL_TABLET | Freq: Every day | ORAL | 2 refills | Status: DC

## 2023-11-29 MED ORDER — PANTOPRAZOLE SODIUM 40 MG PO TBEC: 40.0000 mg | DELAYED_RELEASE_TABLET | Freq: Two times a day (BID) | ORAL | 2 refills | Status: DC

## 2023-11-29 MED ORDER — ONDANSETRON 8 MG PO TBDP: 8.0000 mg | ORAL_TABLET | Freq: Four times a day (QID) | ORAL | 0 refills | Status: DC | PRN

## 2023-11-29 MED ORDER — KETAMINE HCL 50 MG/5ML IJ SOSY
0.3000 mg/kg | PREFILLED_SYRINGE | Freq: Once | INTRAMUSCULAR | Status: AC
  Administered 2023-11-29: 13 mg via INTRAVENOUS
  Filled 2023-11-29: qty 5

## 2023-11-29 MED ORDER — HYDROMORPHONE HCL 1 MG/ML IJ SOLN
1.0000 mg | Freq: Once | INTRAMUSCULAR | Status: AC
  Administered 2023-11-29: 1 mg via INTRAVENOUS
  Filled 2023-11-29: qty 1

## 2023-11-29 MED ORDER — HYDROMORPHONE HCL 4 MG PO TABS
4.0000 mg | ORAL_TABLET | Freq: Four times a day (QID) | ORAL | Status: DC | PRN
  Administered 2023-11-29: 4 mg via ORAL
  Filled 2023-11-29: qty 1

## 2023-11-29 NOTE — ED Triage Notes (Signed)
 Pt arrives with c/o ABD pain that has been ongoing. Pt discharged today. Per pt, she has a gastric ulcer and is still having pain. Pt reports n/v. Pt also reports left sided facial pain due to her trigeminal neuralgia.

## 2023-11-29 NOTE — Discharge Summary (Signed)
 Physician Discharge Summary   Patient: Renee Gutierrez MRN: [id number] DOB: Apr 17, 1960  Admit date:     11/25/2023  Discharge date: 11/29/2023  Discharge Physician: Concepcion Riser   PCP: Patient, No Pcp Per   Recommendations at discharge:   PCP follow up in 1 week. Advised to stop NSAIDS, GI follow up as scheduled.  Discharge Diagnoses: Principal Problem:   Hypokalemia Active Problems:   GERD (gastroesophageal reflux disease)   Restless legs syndrome   Generalized weakness   Nausea & vomiting   Trigeminal neuralgia   Colitis   Prolonged QT interval   Esophageal hiatal hernia   Essential hypertension   Acute post-hemorrhagic anemia   Acute gastric ulcer with hemorrhage  Resolved Problems:   * No resolved hospital problems. *  Hospital Course: Renee Gutierrez is a 63 y.o. female with medical history significant of hypertension, hyperlipidemia, anxiety, GERD, chronic pain syndrome, trigeminal neuralgia who presents to the emergency department due to nausea, vomiting, abdominal pain and decreased oral intake.     EGD done 11/28/23 shows large ulcer in the first portion of her stomach just distal to the esophagus. This is most likely due to her NSAID use, h pylori sample sent. GI advised IV PPI twice daily, Carafate 4 times daily. Clear liquid diet today.    Assessment and Plan: Intractable nausea and vomiting- Gastric ulcer- CT abdomen showed large hiatal hernia. Prior h/o fundoplication, GERD, ? Barrette's. She has been having symptoms since 1 month per daughter, lost weight. EGD 11/28/23 - shows large ulcer in the first portion of her stomach just distal to the esophagus. This is most likely due to her NSAID use, h pylori sample sent. GI advised PPI twice daily, Carafate 4 times daily. Outpatient follow up. She is able to tolerate Clear liquid diet, did not stay to advance to soft diet. Advised PCP and GI follow up as suggested. Patient and daughter understand and  agree.   Hypokalemia- Due to GI losses. K+ is 2.8 on presentation. Improved with IV replacement and orals. Oral kcl supplements prescribed. Monitor outpatient electrolytes and replete accordingly.   Rectosigmoid colitis- Seen on CT abdomen pelvis. She got IV Zosyn therapy and flagyl oral therapy while in hospital. No further need for antibiotics. She is able to tolerate clears, did not stay to advance to soft diet.   Generalized weakness Debility, transient confusion- In the setting of severe GI symptoms, electrolyte abnormalities. Continue supportive care, fall precautions PT OT evaluation for discharge planning   Prolonged QT interval Initial EKG prolonged QT noted, repeat EKG improved Avoid QT prolonging drugs Continue to replete electrolytes as needed.   Esophageal hiatal hernia GERD- Continue Protonix . GI evaluated, plan for EGD tomorrow.   Essential hypertension- BP improved today. Continue amlodipine  10mg , labetalol , Lisinopril . IV hydralazine  for high BP PRN.   Restless leg syndrome Continue ropinirole .   Trigeminal neuralgia  Dilaudid  as needed. She follows pain management clinic.   At risk for malnutrition- BMI 18.3. Encourage and advance diet as tolerated.      Consultants: GI Procedures performed: EGD  Disposition: Home Diet recommendation:  Discharge Diet Orders (From admission, onward)     Start     Ordered   11/29/23 0000  Diet - low sodium heart healthy        11/29/23 1027           Cardiac diet DISCHARGE MEDICATION: Allergies as of 11/29/2023       Reactions   Buprenorphine Hcl Swelling  All extremities and neck and face swelled up huge according to patient   Bupropion Swelling, Other (See Comments)   Moody and depressed   Codeine Itching, Nausea And Vomiting, Nausea Only, Swelling   Sulfamethoxazole-trimethoprim Swelling, Other (See Comments)   Swelled up her eye   Nsaids Other (See Comments)   Unknown    Pregabalin   Swelling   Lyrica     Sulfa Antibiotics Other (See Comments)   Unknown    Tapentadol Nausea And Vomiting   Clindamycin Other (See Comments)   Heart burn   Clindamycin/lincomycin Rash, Other (See Comments)   Heart burn   Cymbalta  [duloxetine  Hcl] Swelling   Diclofenac Swelling   Cream    Diclofenac Sodium Swelling   Oral med   Gabapentin Swelling   Oxycodone  Nausea Only   No longer has a reaction per pt   Toradol  [ketorolac  Tromethamine ] Rash        Medication List     STOP taking these medications    aspirin -acetaminophen -caffeine  250-250-65 MG tablet Commonly known as: EXCEDRIN  MIGRAINE   cefdinir 300 MG capsule Commonly known as: OMNICEF   metroNIDAZOLE 250 MG tablet Commonly known as: FLAGYL   morphine  15 MG tablet Commonly known as: MSIR       TAKE these medications    amLODipine  5 MG tablet Commonly known as: NORVASC  Take 5 mg by mouth daily.   diclofenac Sodium 1 % Gel Commonly known as: VOLTAREN Apply 2 g topically 4 (four) times daily.   DULoxetine  60 MG capsule Commonly known as: CYMBALTA  Take 60 mg by mouth 2 (two) times daily.   erythromycin  ophthalmic ointment Place 1 Application into the left eye 3 (three) times daily.   ferrous sulfate  325 (65 FE) MG tablet Take 1 tablet (325 mg total) by mouth daily with breakfast. Start taking on: November 30, 2023   Gas Relief Extra Strength 125 MG chewable tablet Generic drug: simethicone Chew 125 mg by mouth daily as needed for flatulence.   labetalol  200 MG tablet Commonly known as: NORMODYNE  Take 200 mg by mouth 2 (two) times daily.   lisinopril  40 MG tablet Commonly known as: ZESTRIL  Take 40 mg by mouth daily.   mupirocin ointment 2 % Commonly known as: BACTROBAN Apply 1 Application topically 3 (three) times daily.   naloxone 4 MG/0.1ML Liqd nasal spray kit Commonly known as: NARCAN Place 1 spray into the nose once.   ondansetron  8 MG disintegrating tablet Commonly known as:  ZOFRAN -ODT Take 1 tablet (8 mg total) by mouth every 6 (six) hours as needed for nausea or vomiting.   orphenadrine  100 MG tablet Commonly known as: NORFLEX  Take 100 mg by mouth at bedtime.   pantoprazole  40 MG tablet Commonly known as: Protonix  Take 1 tablet (40 mg total) by mouth 2 (two) times daily. What changed: when to take this   potassium chloride  SA 20 MEQ tablet Commonly known as: KLOR-CON  M Take 2 tablets (40 mEq total) by mouth daily.   prednisoLONE  acetate 1 % ophthalmic suspension Commonly known as: PRED FORTE  Place 1 drop into the left eye in the morning and at bedtime.   rOPINIRole  2 MG tablet Commonly known as: REQUIP  Take 2-4 mg by mouth See admin instructions. 2 mg am, 4 mg pm   sucralfate 1 GM/10ML suspension Commonly known as: CARAFATE Take 10 mLs (1 g total) by mouth every 6 (six) hours.   Vitamin D  (Ergocalciferol ) 1.25 MG (50000 UNIT) Caps capsule Commonly known as: DRISDOL  Take 50,000 Units by  mouth every Saturday.        Follow-up Information     Ezzard Sonny RAMAN, PA-C. Schedule an appointment as soon as possible for a visit in 2 week(s).   Specialty: Gastroenterology Contact information: [address] [place] [address] [phone]                Discharge Exam: Fredricka Weights   11/25/23 1946  Weight: 41.1 kg      11/29/2023   11:57 AM 11/29/2023    5:00 AM 11/28/2023    8:22 PM  Vitals with BMI  Systolic 129 169 830  Diastolic 70 86 94  Pulse 102 89 115   General - Elderly Caucasian thin built female, distress due to headache, abdominal pain. HEENT - PERRLA, EOMI, atraumatic head, non tender sinuses. Lung - Clear, basal rales, no rhonchi, wheezes. Heart - S1, S2 heard, no murmurs, rubs, no pedal edema. Abdomen - Soft, diffuse tender, no guarding, bowel sounds good Neuro - Alert, awake and oriented, non focal exam. Skin - Warm and dry.  Condition at discharge: stable  The results of significant  diagnostics from this hospitalization (including imaging, microbiology, ancillary and laboratory) are listed below for reference.   Imaging Studies: CT Angio Chest PE W and/or Wo Contrast Result Date: 11/25/2023 CLINICAL DATA:  Pulmonary embolus suspected with high probability. Recent discharge from [facility] for low potassium, pneumonia, and bacterial infection of GI tract. Acute nonlocalized abdominal pain. EXAM: CT ANGIOGRAPHY CHEST CT ABDOMEN AND PELVIS WITH CONTRAST TECHNIQUE: Multidetector CT imaging of the chest was performed using the standard protocol during bolus administration of intravenous contrast. Multiplanar CT image reconstructions and MIPs were obtained to evaluate the vascular anatomy. Multidetector CT imaging of the abdomen and pelvis was performed using the standard protocol during bolus administration of intravenous contrast. RADIATION DOSE REDUCTION: This exam was performed according to the departmental dose-optimization program which includes automated exposure control, adjustment of the mA and/or kV according to patient size and/or use of iterative reconstruction technique. CONTRAST:  OMNIPAQUE  IOHEXOL  350 MG/ML SOLN COMPARISON:  Chest radiograph 11/25/2023. CT abdomen and pelvis 09/20/2023. CT chest 07/05/2011 FINDINGS: CTA CHEST FINDINGS Cardiovascular: Technically adequate study with good opacification of the central and segmental pulmonary arteries. Moderate motion artifact. No focal filling defects. No evidence of significant pulmonary embolus. Cardiac enlargement. Small pericardial effusion. Normal caliber thoracic aorta. No aortic dissection. Great vessel origins are patent. Calcification of the aorta and coronary arteries. Mediastinum/Nodes: Large esophageal hiatal hernia. Esophagus is decompressed. No significant lymphadenopathy. Thyroid gland is unremarkable. Lungs/Pleura: Trace left pleural effusion. Atelectasis in the left base. No pneumothorax. Musculoskeletal:  Postoperative changes in the cervical spine. Degenerative changes in the thoracic spine. No acute bony abnormalities. Review of the MIP images confirms the above findings. CT ABDOMEN and PELVIS FINDINGS Hepatobiliary: No focal liver abnormality is seen. Status post cholecystectomy. No biliary dilatation. Pancreas: Unremarkable. No pancreatic ductal dilatation or surrounding inflammatory changes. Spleen: Normal in size without focal abnormality. Adrenals/Urinary Tract: Adrenal glands are unremarkable. Kidneys are normal, without renal calculi, focal lesion, or hydronephrosis. Bladder is unremarkable. Stomach/Bowel: Stomach, small bowel, and colon are not abnormally distended. Under distention limits evaluation but there is evidence of wall thickening in the rectosigmoid region which likely indicates colitis. This may represent infectious or inflammatory etiologies. No abscess identified. Appendix is not visualized. Vascular/Lymphatic: Aortic atherosclerosis. No enlarged abdominal or pelvic lymph nodes. Reproductive: Status post hysterectomy. No adnexal masses. Other: No free air or free fluid  in the abdomen. Abdominal wall musculature appears intact. Edema in the subcutaneous fat. Musculoskeletal: Lumbar scoliosis convex towards the left. Degenerative changes in the lumbar spine. No acute bony abnormalities. Review of the MIP images confirms the above findings. IMPRESSION: 1. No evidence of significant pulmonary embolus. 2. Large esophageal hiatal hernia. 3. Small left pleural effusion with basilar atelectasis. 4. Aortic atherosclerosis. 5. Wall thickening in the rectosigmoid colon suggesting colitis. No abscess. No proximal obstruction. Electronically Signed   By: Elsie Gravely M.D.   On: 11/25/2023 22:21   CT ABDOMEN PELVIS W CONTRAST Result Date: 11/25/2023 CLINICAL DATA:  Pulmonary embolus suspected with high probability. Recent discharge from [facility] for low potassium, pneumonia, and bacterial  infection of GI tract. Acute nonlocalized abdominal pain. EXAM: CT ANGIOGRAPHY CHEST CT ABDOMEN AND PELVIS WITH CONTRAST TECHNIQUE: Multidetector CT imaging of the chest was performed using the standard protocol during bolus administration of intravenous contrast. Multiplanar CT image reconstructions and MIPs were obtained to evaluate the vascular anatomy. Multidetector CT imaging of the abdomen and pelvis was performed using the standard protocol during bolus administration of intravenous contrast. RADIATION DOSE REDUCTION: This exam was performed according to the departmental dose-optimization program which includes automated exposure control, adjustment of the mA and/or kV according to patient size and/or use of iterative reconstruction technique. CONTRAST:  OMNIPAQUE  IOHEXOL  350 MG/ML SOLN COMPARISON:  Chest radiograph 11/25/2023. CT abdomen and pelvis 09/20/2023. CT chest 07/05/2011 FINDINGS: CTA CHEST FINDINGS Cardiovascular: Technically adequate study with good opacification of the central and segmental pulmonary arteries. Moderate motion artifact. No focal filling defects. No evidence of significant pulmonary embolus. Cardiac enlargement. Small pericardial effusion. Normal caliber thoracic aorta. No aortic dissection. Great vessel origins are patent. Calcification of the aorta and coronary arteries. Mediastinum/Nodes: Large esophageal hiatal hernia. Esophagus is decompressed. No significant lymphadenopathy. Thyroid gland is unremarkable. Lungs/Pleura: Trace left pleural effusion. Atelectasis in the left base. No pneumothorax. Musculoskeletal: Postoperative changes in the cervical spine. Degenerative changes in the thoracic spine. No acute bony abnormalities. Review of the MIP images confirms the above findings. CT ABDOMEN and PELVIS FINDINGS Hepatobiliary: No focal liver abnormality is seen. Status post cholecystectomy. No biliary dilatation. Pancreas: Unremarkable. No pancreatic ductal dilatation or  surrounding inflammatory changes. Spleen: Normal in size without focal abnormality. Adrenals/Urinary Tract: Adrenal glands are unremarkable. Kidneys are normal, without renal calculi, focal lesion, or hydronephrosis. Bladder is unremarkable. Stomach/Bowel: Stomach, small bowel, and colon are not abnormally distended. Under distention limits evaluation but there is evidence of wall thickening in the rectosigmoid region which likely indicates colitis. This may represent infectious or inflammatory etiologies. No abscess identified. Appendix is not visualized. Vascular/Lymphatic: Aortic atherosclerosis. No enlarged abdominal or pelvic lymph nodes. Reproductive: Status post hysterectomy. No adnexal masses. Other: No free air or free fluid in the abdomen. Abdominal wall musculature appears intact. Edema in the subcutaneous fat. Musculoskeletal: Lumbar scoliosis convex towards the left. Degenerative changes in the lumbar spine. No acute bony abnormalities. Review of the MIP images confirms the above findings. IMPRESSION: 1. No evidence of significant pulmonary embolus. 2. Large esophageal hiatal hernia. 3. Small left pleural effusion with basilar atelectasis. 4. Aortic atherosclerosis. 5. Wall thickening in the rectosigmoid colon suggesting colitis. No abscess. No proximal obstruction. Electronically Signed   By: Elsie Gravely M.D.   On: 11/25/2023 22:21   DG Chest Port 1 View Result Date: 11/25/2023 EXAM: 1 VIEW(S) XRAY OF THE CHEST 11/25/2023 08:15:00 PM COMPARISON: 07/06/2023 CLINICAL HISTORY: Questionable sepsis - evaluate for abnormality. FINDINGS: LUNGS  AND PLEURA: No focal pulmonary opacity. No pulmonary edema. No pleural effusion. No pneumothorax. HEART AND MEDIASTINUM: Normal heart size and mediastinal contours. Atherosclerotic calcifications. BONES AND SOFT TISSUES: Cervical fusion hardware is noted. No acute osseous abnormality. DIAPHRAGM AND UPPER ABDOMEN: A small hiatal hernia is present. IMPRESSION: 1.  No acute abnormalities. Electronically signed by: Andrea Gasman MD 11/25/2023 08:25 PM EDT RP Workstation: [id number]    Microbiology: Results for orders placed or performed during the hospital encounter of 11/25/23  Resp panel by RT-PCR (RSV, Flu A&B, Covid) Anterior Nasal Swab     Status: None   Collection Time: 11/25/23  8:07 PM   Specimen: Anterior Nasal Swab  Result Value Ref Range Status   SARS Coronavirus 2 by RT PCR NEGATIVE NEGATIVE Final    Comment: (NOTE) SARS-CoV-2 target nucleic acids are NOT DETECTED.  The SARS-CoV-2 RNA is generally detectable in upper respiratory specimens during the acute phase of infection. The lowest concentration of SARS-CoV-2 viral copies this assay can detect is 138 copies/mL. A negative result does not preclude SARS-Cov-2 infection and should not be used as the sole basis for treatment or other patient management decisions. A negative result may occur with  improper specimen collection/handling, submission of specimen other than nasopharyngeal swab, presence of viral mutation(s) within the areas targeted by this assay, and inadequate number of viral copies(<138 copies/mL). A negative result must be combined with clinical observations, patient history, and epidemiological information. The expected result is Negative.  Fact Sheet for Patients:  BloggerCourse.com  Fact Sheet for Healthcare Providers:  SeriousBroker.it  This test is no t yet approved or cleared by the [place]  FDA and  has been authorized for detection and/or diagnosis of SARS-CoV-2 by FDA under an Emergency Use Authorization (EUA). This EUA will remain  in effect (meaning this test can be used) for the duration of the COVID-19 declaration under Section 564(b)(1) of the Act, 21 U.S.C.section 360bbb-3(b)(1), unless the authorization is terminated  or revoked sooner.       Influenza A by PCR NEGATIVE NEGATIVE Final    Influenza B by PCR NEGATIVE NEGATIVE Final    Comment: (NOTE) The Xpert Xpress SARS-CoV-2/FLU/RSV plus assay is intended as an aid in the diagnosis of influenza from Nasopharyngeal swab specimens and should not be used as a sole basis for treatment. Nasal washings and aspirates are unacceptable for Xpert Xpress SARS-CoV-2/FLU/RSV testing.  Fact Sheet for Patients: BloggerCourse.com  Fact Sheet for Healthcare Providers: SeriousBroker.it  This test is not yet approved or cleared by the [place]  FDA and has been authorized for detection and/or diagnosis of SARS-CoV-2 by FDA under an Emergency Use Authorization (EUA). This EUA will remain in effect (meaning this test can be used) for the duration of the COVID-19 declaration under Section 564(b)(1) of the Act, 21 U.S.C. section 360bbb-3(b)(1), unless the authorization is terminated or revoked.     Resp Syncytial Virus by PCR NEGATIVE NEGATIVE Final    Comment: (NOTE) Fact Sheet for Patients: BloggerCourse.com  Fact Sheet for Healthcare Providers: SeriousBroker.it  This test is not yet approved or cleared by the [place]  FDA and has been authorized for detection and/or diagnosis of SARS-CoV-2 by FDA under an Emergency Use Authorization (EUA). This EUA will remain in effect (meaning this test can be used) for the duration of the COVID-19 declaration under Section 564(b)(1) of the Act, 21 U.S.C. section 360bbb-3(b)(1), unless the authorization is terminated or revoked.  Performed at [facility], [address], [place], [place] [address]  Blood Culture (routine x 2)     Status: None (Preliminary result)   Collection Time: 11/25/23  8:08 PM   Specimen: BLOOD  Result Value Ref Range Status   Specimen Description BLOOD LEFT ANTECUBITAL  Final   Special Requests   Final    BOTTLES DRAWN AEROBIC AND ANAEROBIC Blood Culture  adequate volume   Culture   Final    NO GROWTH 4 DAYS Performed at [facility], [address], [place], [place] [address]    Report Status PENDING  Incomplete  Blood Culture (routine x 2)     Status: None (Preliminary result)   Collection Time: 11/25/23  9:34 PM   Specimen: BLOOD  Result Value Ref Range Status   Specimen Description BLOOD RIGHT ANTECUBITAL  Final   Special Requests   Final    BOTTLES DRAWN AEROBIC AND ANAEROBIC Blood Culture adequate volume   Culture   Final    NO GROWTH 4 DAYS Performed at [facility], [address], [place], [place] [address]    Report Status PENDING  Incomplete    Labs: CBC: Recent Labs  Lab 11/25/23 2008 11/26/23 0544 11/27/23 0510 11/28/23 0525 11/29/23 0407  WBC 10.8* 11.0* 15.1* 14.7* 12.9*  HGB 8.8* 7.7* 9.6* 9.6* 9.8*  HCT 28.5* 24.9* 31.4* 31.3* 31.3*  MCV 88.0 89.2 88.7 88.7 87.4  PLT 712* 537* 735* 823* 865*   Basic Metabolic Panel: Recent Labs  Lab 11/25/23 2008 11/26/23 0544 11/27/23 0510 11/28/23 0525 11/29/23 0407  NA 140 138 140 140 138  K 2.8* 3.2* 3.5 3.5 3.2*  CL 101 103 101 101 103  CO2 23 24 25 23 23   GLUCOSE 99 77 123* 122* 110*  BUN 7* <5* <5* 6* 8  CREATININE 0.79 0.59 0.69 0.68 0.75  CALCIUM  9.2 8.3* 7.6* 9.2 9.5  MG 1.9 1.9  --   --   --   PHOS  --  2.0*  --   --   --    Liver Function Tests: Recent Labs  Lab 11/25/23 2008 11/26/23 0544  AST 20 15  ALT 9 8  ALKPHOS 79 60  BILITOT <0.2 <0.2  PROT 6.3* 4.8*  ALBUMIN 3.5 2.7*   CBG: No results for input(s): GLUCAP in the last 168 hours.  Discharge time spent: 35 minutes.  Signed: Concepcion Riser, MD [facility] 11/29/2023

## 2023-11-29 NOTE — ED Provider Notes (Signed)
 [place] EMERGENCY DEPARTMENT AT [facility] Provider Note   CSN: [id number] Arrival date & time: 11/29/23  1914     Patient presents with: Abdominal Pain and Facial Pain   Renee Gutierrez is a 63 y.o. female.    Abdominal Pain  Patient is a 64 year old female who unfortunately suffers with trigeminal neuralgia on the left side of her face, she also recently was diagnosed with a stomach or peptic ulcer, the patient had undergone upper endoscopy within the last 48 hours, she was discharged from the hospital earlier today but continues to complain of ongoing upper abdominal pain as well as left-sided facial pain which is started today as well.  She reports that she takes Dilaudid  for her trigeminal neuralgia, this medication is not listed in her medication list.  She does report that she has been diagnosed with that for about 8 years, she does not take any other specific medications for that.  She is on duloxetine .  I did review the medical record including the endoscopy report, she did not have any bleeding ulcer, but she did have what appeared to be a gastric ulcer and some erosive esophagitis but no active bleeding.  The CT scan at the time also showed that she had some colitis, she did not have any blood in her stools she did not have a colonoscopy, she continues to have mild diarrhea    Prior to Admission medications   Medication Sig Start Date End Date Taking? Authorizing Provider  amLODipine  (NORVASC ) 5 MG tablet Take 5 mg by mouth daily. 11/24/23   [provider]  diclofenac Sodium (VOLTAREN) 1 % GEL Apply 2 g topically 4 (four) times daily. 04/18/20   [provider]  DULoxetine  (CYMBALTA ) 60 MG capsule Take 60 mg by mouth 2 (two) times daily. 05/16/20   [provider]  erythromycin  ophthalmic ointment Place 1 Application into the left eye 3 (three) times daily.    [provider]  ferrous sulfate  325 (65 FE) MG tablet Take 1 tablet  (325 mg total) by mouth daily with breakfast. 11/30/23   Darci Pore, MD  labetalol  (NORMODYNE ) 200 MG tablet Take 200 mg by mouth 2 (two) times daily.    [provider]  lisinopril  (PRINIVIL ,ZESTRIL ) 40 MG tablet Take 40 mg by mouth daily. 01/31/14 11/25/23  [provider]  mupirocin ointment (BACTROBAN) 2 % Apply 1 Application topically 3 (three) times daily. 03/25/16   [provider]  naloxone [facility]) nasal spray 4 mg/0.1 mL Place 1 spray into the nose once. 09/17/18   [provider]  ondansetron  (ZOFRAN -ODT) 8 MG disintegrating tablet Take 1 tablet (8 mg total) by mouth every 6 (six) hours as needed for nausea or vomiting. 11/29/23   Darci Pore, MD  orphenadrine  (NORFLEX ) 100 MG tablet Take 100 mg by mouth at bedtime. 10/06/19   [provider]  pantoprazole  (PROTONIX ) 40 MG tablet Take 1 tablet (40 mg total) by mouth 2 (two) times daily. 11/29/23   Darci Pore, MD  potassium chloride  SA (KLOR-CON  M) 20 MEQ tablet Take 2 tablets (40 mEq total) by mouth daily. 11/29/23   Darci Pore, MD  prednisoLONE  acetate (PRED FORTE ) 1 % ophthalmic suspension Place 1 drop into the left eye in the morning and at bedtime. 03/01/20   [provider]  rOPINIRole  (REQUIP ) 2 MG tablet Take 2-4 mg by mouth See admin instructions. 2 mg am, 4 mg pm 11/11/14   [provider]  simethicone (GAS  RELIEF EXTRA STRENGTH) 125 MG chewable tablet Chew 125 mg by mouth daily as needed for flatulence.  05/24/14   [provider]  sucralfate (CARAFATE) 1 GM/10ML suspension Take 10 mLs (1 g total) by mouth every 6 (six) hours. 11/29/23   Cleotilde Rogue, MD  Vitamin D , Ergocalciferol , (DRISDOL ) 1.25 MG (50000 UNIT) CAPS capsule Take 50,000 Units by mouth every Saturday. 05/10/23   [provider]    Allergies: Buprenorphine hcl, Bupropion, Codeine, Sulfamethoxazole-trimethoprim, Nsaids, Pregabalin , Sulfa antibiotics,  Tapentadol, Clindamycin, Clindamycin/lincomycin, Cymbalta  [duloxetine  hcl], Diclofenac, Diclofenac sodium, Gabapentin, Oxycodone , and Toradol  [ketorolac  tromethamine ]    Review of Systems  Gastrointestinal:  Positive for abdominal pain.  All other systems reviewed and are negative.   Updated Vital Signs BP (!) 176/86   Pulse (!) 113   Temp 99.6 F (37.6 C) (Oral)   Resp 20   Wt 44.5 kg   SpO2 99%   BMI 19.79 kg/m   Physical Exam Vitals and nursing note reviewed.  Constitutional:      General: She is not in acute distress.    Appearance: She is well-developed.  HENT:     Head: Normocephalic and atraumatic.     Mouth/Throat:     Pharynx: No oropharyngeal exudate.  Eyes:     General: No scleral icterus.       Right eye: No discharge.        Left eye: No discharge.     Conjunctiva/sclera: Conjunctivae normal.     Pupils: Pupils are equal, round, and reactive to light.  Neck:     Thyroid: No thyromegaly.     Vascular: No JVD.  Cardiovascular:     Rate and Rhythm: Regular rhythm. Tachycardia present.     Heart sounds: Normal heart sounds. No murmur heard.    No friction rub. No gallop.  Pulmonary:     Effort: Pulmonary effort is normal. No respiratory distress.     Breath sounds: Normal breath sounds. No wheezing or rales.  Abdominal:     General: Bowel sounds are normal. There is no distension.     Palpations: Abdomen is soft. There is no mass.     Tenderness: There is abdominal tenderness.     Comments: Mild tenderness in the epigastrium  Musculoskeletal:        General: No tenderness. Normal range of motion.     Cervical back: Normal range of motion and neck supple.     Right lower leg: No edema.     Left lower leg: No edema.  Lymphadenopathy:     Cervical: No cervical adenopathy.  Skin:    General: Skin is warm and dry.     Findings: No erythema or rash.  Neurological:     Mental Status: She is alert.     Coordination: Coordination normal.     Comments:  Hypersensitivity to touch on the left face  Psychiatric:        Behavior: Behavior normal.     (all labs ordered are listed, but only abnormal results are displayed) Labs Reviewed - No data to display  EKG: None  Radiology: No results found.   Procedures   Medications Ordered in the ED  0.9 %  sodium chloride  infusion ( Intravenous New Bag/Given 11/29/23 2014)  ketamine  50 mg in normal saline 5 mL (10 mg/mL) syringe (13 mg Intravenous Given 11/29/23 2018)  alum & mag hydroxide-simeth (MAALOX/MYLANTA) 200-200-20 MG/5ML suspension 30 mL (30 mLs Oral Given 11/29/23 2006)  HYDROmorphone  (DILAUDID ) injection 1  mg (1 mg Intravenous Given 11/29/23 2015)  pantoprazole  (PROTONIX ) injection 40 mg (40 mg Intravenous Given 11/29/23 2017)  labetalol  (NORMODYNE ) tablet 200 mg (200 mg Oral Given 11/29/23 2148)                                    Medical Decision Making Risk OTC drugs. Prescription drug management.   Ultimately this patient appears to be uncomfortable, she is mildly tachycardic at 110 bpm, she has a known stomach ulcer, she will be given a GI cocktail, Protonix  and I will also give her pain dose ketamine  as well as hydromorphone .  She has a nonsurgical abdomen, she is agreeable to the plan, she has recently been admitted and had a very broad workup including CT scans and labs, she has not had any bleeding, I do not think that repeat blood work will be necessary.  IV fluids will be given  The patient endorses that she has had ketamine  in the past which she states has had some improvement.  She did not have much improvement with the ketamine  here nor the hydromorphone .  She was given Protonix  and a GI cocktail as well as 1 L of IV fluids, I also gave her her evening dose of labetalol .  I do not think the patient has any acute emergency condition that would need to be admitted to the hospital for stabilizing care or specialty consultation.  She has been in referral to a  neurologist as an outpatient  The patient was prescribed a refill on her Carafate, she has Protonix   Stable for discharge     Final diagnoses:  Trigeminal neuralgia of left side of face  Peptic ulcer disease    ED Discharge Orders          Ordered    sucralfate (CARAFATE) 1 GM/10ML suspension  Every 6 hours        11/29/23 2206               Cleotilde Rogue, MD 11/29/23 2208

## 2023-11-29 NOTE — Discharge Instructions (Signed)
 We have looked over all of your records from the prior admission to the hospital, the endoscopy and the recommendations from the gastroenterologist.  We have given you medications including hydromorphone  or Dilaudid , ketamine , and labetalol  which is your evening dose of blood pressure medicine.  I want you to make sure that you are continuing to take the following medications  Pantoprazole  once a day, take it at the same time every day Carafate with meals and before bed  Avoid anti-inflammatories such as Aleve  or ibuprofen or aspirin  containing products  You will need to speak with your neurologist regarding your trigeminal neuralgia.  If you need referral to a neurologist please see the phone number above  Thank you for allowing us  to treat you in the emergency department today.  After reviewing your examination and potential testing that was done it appears that you are safe to go home.  I would like for you to follow-up with your doctor within the next several days, have them obtain your records and follow-up with them to review all potential tests and results from your visit.  If you should develop severe or worsening symptoms return to the emergency department immediately

## 2023-11-29 NOTE — Telephone Encounter (Signed)
 Please arrange hospital follow-up visit for this patient with Sonny or myself (if [place] unavailable) in 3 to 4 weeks.  Thank you

## 2023-11-29 NOTE — Plan of Care (Signed)

## 2023-11-30 LAB — CULTURE, BLOOD (ROUTINE X 2)
Culture: NO GROWTH
Culture: NO GROWTH
Special Requests: ADEQUATE
Special Requests: ADEQUATE

## 2023-12-01 ENCOUNTER — Other Ambulatory Visit: Payer: Self-pay

## 2023-12-01 ENCOUNTER — Emergency Department (HOSPITAL_COMMUNITY)
Admission: EM | Admit: 2023-12-01 | Discharge: 2023-12-01 | Disposition: A | Discharge status: Home / Self Care | Attending: Emergency Medicine | Admitting: Emergency Medicine

## 2023-12-01 ENCOUNTER — Emergency Department (HOSPITAL_COMMUNITY)

## 2023-12-01 ENCOUNTER — Encounter (HOSPITAL_COMMUNITY): Payer: Self-pay | Admitting: Emergency Medicine

## 2023-12-01 DIAGNOSIS — I129 Hypertensive chronic kidney disease with stage 1 through stage 4 chronic kidney disease, or unspecified chronic kidney disease: Secondary | ICD-10-CM | POA: Diagnosis not present

## 2023-12-01 DIAGNOSIS — N189 Chronic kidney disease, unspecified: Secondary | ICD-10-CM | POA: Diagnosis not present

## 2023-12-01 DIAGNOSIS — R110 Nausea: Secondary | ICD-10-CM | POA: Diagnosis not present

## 2023-12-01 DIAGNOSIS — Z79899 Other long term (current) drug therapy: Secondary | ICD-10-CM | POA: Diagnosis not present

## 2023-12-01 DIAGNOSIS — R519 Headache, unspecified: Secondary | ICD-10-CM | POA: Diagnosis not present

## 2023-12-01 DIAGNOSIS — R1013 Epigastric pain: Secondary | ICD-10-CM | POA: Diagnosis not present

## 2023-12-01 DIAGNOSIS — R109 Unspecified abdominal pain: Secondary | ICD-10-CM | POA: Diagnosis present

## 2023-12-01 LAB — COMPREHENSIVE METABOLIC PANEL WITH GFR
ALT: 7 U/L (ref 0–44)
AST: 13 U/L — ABNORMAL LOW (ref 15–41)
Albumin: 3.3 g/dL — ABNORMAL LOW (ref 3.5–5.0)
Alkaline Phosphatase: 84 U/L (ref 38–126)
Anion gap: 13 (ref 5–15)
BUN: 12 mg/dL (ref 8–23)
CO2: 21 mmol/L — ABNORMAL LOW (ref 22–32)
Calcium: 9.5 mg/dL (ref 8.9–10.3)
Chloride: 108 mmol/L (ref 98–111)
Creatinine, Ser: 1.02 mg/dL — ABNORMAL HIGH (ref 0.44–1.00)
GFR, Estimated: >60 mL/min (ref >60)
Glucose, Bld: 102 mg/dL — ABNORMAL HIGH (ref 70–99)
Potassium: 3.1 mmol/L — ABNORMAL LOW (ref 3.5–5.1)
Sodium: 142 mmol/L (ref 135–145)
Total Bilirubin: <0.2 mg/dL (ref 0.0–1.2)
Total Protein: 5.7 g/dL — ABNORMAL LOW (ref 6.5–8.1)

## 2023-12-01 LAB — CBC WITH DIFFERENTIAL/PLATELET
Abs Immature Granulocytes: 0.29 K/uL — ABNORMAL HIGH (ref 0.00–0.07)
Basophils Absolute: 0.2 K/uL — ABNORMAL HIGH (ref 0.0–0.1)
Basophils Relative: 1 %
Eosinophils Absolute: 0.1 K/uL (ref 0.0–0.5)
Eosinophils Relative: 1 %
HCT: 30 % — ABNORMAL LOW (ref 36.0–46.0)
Hemoglobin: 9 g/dL — ABNORMAL LOW (ref 12.0–15.0)
Immature Granulocytes: 2 %
Lymphocytes Relative: 10 %
Lymphs Abs: 1.9 K/uL (ref 0.7–4.0)
MCH: 26.9 pg (ref 26.0–34.0)
MCHC: 30 g/dL (ref 30.0–36.0)
MCV: 89.6 fL (ref 80.0–100.0)
Monocytes Absolute: 1.4 K/uL — ABNORMAL HIGH (ref 0.1–1.0)
Monocytes Relative: 7 %
Neutro Abs: 15.7 K/uL — ABNORMAL HIGH (ref 1.7–7.7)
Neutrophils Relative %: 79 %
Platelets: 864 K/uL — ABNORMAL HIGH (ref 150–400)
RBC: 3.35 MIL/uL — ABNORMAL LOW (ref 3.87–5.11)
RDW: 14.9 % (ref 11.5–15.5)
WBC: 19.5 K/uL — ABNORMAL HIGH (ref 4.0–10.5)
nRBC: 0 % (ref 0.0–0.2)

## 2023-12-01 LAB — MAGNESIUM: Magnesium: 2 mg/dL (ref 1.7–2.4)

## 2023-12-01 LAB — LIPASE, BLOOD: Lipase: 23 U/L (ref 11–51)

## 2023-12-01 MED ORDER — LIDOCAINE VISCOUS HCL 2 % MT SOLN
15.0000 mL | Freq: Once | OROMUCOSAL | Status: AC
  Administered 2023-12-01: 15 mL via ORAL
  Filled 2023-12-01: qty 15

## 2023-12-01 MED ORDER — LACTATED RINGERS IV BOLUS
1000.0000 mL | Freq: Once | INTRAVENOUS | Status: AC
  Administered 2023-12-01: 1000 mL via INTRAVENOUS

## 2023-12-01 MED ORDER — KETAMINE HCL 50 MG/5ML IJ SOSY
0.3000 mg/kg | PREFILLED_SYRINGE | Freq: Once | INTRAMUSCULAR | Status: AC
  Administered 2023-12-01: 13 mg via INTRAVENOUS
  Filled 2023-12-01: qty 5

## 2023-12-01 MED ORDER — HYDROMORPHONE HCL 1 MG/ML IJ SOLN
1.0000 mg | Freq: Once | INTRAMUSCULAR | Status: AC
  Administered 2023-12-01: 1 mg via INTRAVENOUS
  Filled 2023-12-01: qty 1

## 2023-12-01 MED ORDER — FAMOTIDINE IN NACL 20-0.9 MG/50ML-% IV SOLN
20.0000 mg | Freq: Once | INTRAVENOUS | Status: AC
  Administered 2023-12-01: 20 mg via INTRAVENOUS
  Filled 2023-12-01: qty 50

## 2023-12-01 MED ORDER — POTASSIUM CHLORIDE 20 MEQ PO PACK
40.0000 meq | PACK | Freq: Once | ORAL | Status: AC
  Administered 2023-12-01: 40 meq via ORAL
  Filled 2023-12-01: qty 2

## 2023-12-01 MED ORDER — ALUM & MAG HYDROXIDE-SIMETH 200-200-20 MG/5ML PO SUSP
30.0000 mL | Freq: Once | ORAL | Status: AC
Start: 2023-12-01 — End: 2023-12-01
  Administered 2023-12-01: 30 mL via ORAL
  Filled 2023-12-01: qty 30

## 2023-12-01 MED ORDER — PROCHLORPERAZINE EDISYLATE 10 MG/2ML IJ SOLN
10.0000 mg | Freq: Once | INTRAMUSCULAR | Status: AC
  Administered 2023-12-01: 10 mg via INTRAVENOUS
  Filled 2023-12-01: qty 2

## 2023-12-01 MED ORDER — HYDROCODONE-ACETAMINOPHEN 5-325 MG PO TABS: 2.0000 | ORAL_TABLET | Freq: Three times a day (TID) | ORAL | 0 refills | Status: DC | PRN

## 2023-12-01 NOTE — Discharge Instructions (Addendum)
 Ensure you follow-up with your PCP and pain management clinic for ongoing management of your chronic pain.  A prescription for a short course of narcotic pain medication was sent to your pharmacy.  Take these only as needed.  Ensure that you are taking your other home medications as prescribed.  Return to the emergency department for any new or worsening symptoms of concern.

## 2023-12-01 NOTE — ED Provider Notes (Signed)
 [place] EMERGENCY DEPARTMENT AT [facility] Provider Note   CSN: [id number] Arrival date & time: 12/01/23  1206     Patient presents with: Abdominal Pain   Renee Gutierrez is a 63 y.o. female.    Abdominal Pain Associated symptoms: nausea   Patient presents for multiple complaints.  Medical history includes GERD, IBS, migraines, trigeminal neuralgia, fibromyalgia, HTN, PUD, CKD.  She was admitted a week ago for concern of colitis.  She was discharged 2 days ago.  She returned to the ED same day of discharge with complaint of ongoing left-sided face pain and abdominal pain.  She states that she was previously managed by a pain management clinic but is currently out of any narcotic pain medicines.  She has had ongoing abdominal pain, nausea, and low facial pain.  She denies any significant evolution to her symptoms.     Prior to Admission medications   Medication Sig Start Date End Date Taking? Authorizing Provider  HYDROcodone -acetaminophen  (NORCO/VICODIN) 5-325 MG tablet Take 2 tablets by mouth every 8 (eight) hours as needed for up to 3 days. 12/01/23 12/04/23 Yes Melvenia Motto, MD  amLODipine  (NORVASC ) 5 MG tablet Take 5 mg by mouth daily. 11/24/23   [provider]  diclofenac Sodium (VOLTAREN) 1 % GEL Apply 2 g topically 4 (four) times daily. 04/18/20   [provider]  DULoxetine  (CYMBALTA ) 60 MG capsule Take 60 mg by mouth 2 (two) times daily. 05/16/20   [provider]  erythromycin  ophthalmic ointment Place 1 Application into the left eye 3 (three) times daily.    [provider]  ferrous sulfate  325 (65 FE) MG tablet Take 1 tablet (325 mg total) by mouth daily with breakfast. 11/30/23   Darci Pore, MD  labetalol  (NORMODYNE ) 200 MG tablet Take 200 mg by mouth 2 (two) times daily.    [provider]  lisinopril  (PRINIVIL ,ZESTRIL ) 40 MG tablet Take 40 mg by mouth daily. 01/31/14 11/25/23  [provider]   mupirocin ointment (BACTROBAN) 2 % Apply 1 Application topically 3 (three) times daily. 03/25/16   [provider]  naloxone [facility]) nasal spray 4 mg/0.1 mL Place 1 spray into the nose once. 09/17/18   [provider]  ondansetron  (ZOFRAN -ODT) 8 MG disintegrating tablet Take 1 tablet (8 mg total) by mouth every 6 (six) hours as needed for nausea or vomiting. 11/29/23   Darci Pore, MD  orphenadrine  (NORFLEX ) 100 MG tablet Take 100 mg by mouth at bedtime. 10/06/19   [provider]  pantoprazole  (PROTONIX ) 40 MG tablet Take 1 tablet (40 mg total) by mouth 2 (two) times daily. 11/29/23   Darci Pore, MD  potassium chloride  SA (KLOR-CON  M) 20 MEQ tablet Take 2 tablets (40 mEq total) by mouth daily. 11/29/23   Darci Pore, MD  prednisoLONE  acetate (PRED FORTE ) 1 % ophthalmic suspension Place 1 drop into the left eye in the morning and at bedtime. 03/01/20   [provider]  rOPINIRole  (REQUIP ) 2 MG tablet Take 2-4 mg by mouth See admin instructions. 2 mg am, 4 mg pm 11/11/14   [provider]  simethicone (GAS RELIEF EXTRA STRENGTH) 125 MG chewable tablet Chew 125 mg by mouth daily as needed for flatulence.  05/24/14   [provider]  sucralfate (CARAFATE) 1 GM/10ML suspension Take 10 mLs (1 g total) by mouth every 6 (six) hours. 11/29/23   Cleotilde Rogue, MD  Vitamin D , Ergocalciferol , (DRISDOL ) 1.25 MG (50000 UNIT) CAPS capsule Take 50,000 Units  by mouth every Saturday. 05/10/23   [provider]    Allergies: Buprenorphine hcl, Bupropion, Codeine, Sulfamethoxazole-trimethoprim, Nsaids, Pregabalin , Sulfa antibiotics, Tapentadol, Clindamycin, Clindamycin/lincomycin, Cymbalta  [duloxetine  hcl], Diclofenac, Diclofenac sodium, Gabapentin, Oxycodone , and Toradol  [ketorolac  tromethamine ]    Review of Systems  Gastrointestinal:  Positive for abdominal pain and nausea.  All other systems reviewed and are  negative.   Updated Vital Signs BP (!) 157/85   Pulse 94   Temp 97.9 F (36.6 C)   Resp 19   Ht 4' 11 (1.499 m)   Wt 44.5 kg   SpO2 98%   BMI 19.79 kg/m   Physical Exam Vitals and nursing note reviewed.  Constitutional:      General: She is not in acute distress.    Appearance: She is well-developed. She is not ill-appearing, toxic-appearing or diaphoretic.  HENT:     Head: Normocephalic and atraumatic.     Mouth/Throat:     Mouth: Mucous membranes are moist.  Eyes:     Extraocular Movements: Extraocular movements intact.     Conjunctiva/sclera: Conjunctivae normal.  Cardiovascular:     Rate and Rhythm: Normal rate and regular rhythm.  Pulmonary:     Effort: Pulmonary effort is normal. No respiratory distress.  Abdominal:     Palpations: Abdomen is soft.  Musculoskeletal:        General: No swelling.     Cervical back: Neck supple.  Skin:    General: Skin is warm and dry.  Neurological:     General: No focal deficit present.     Mental Status: She is alert and oriented to person, place, and time.  Psychiatric:        Mood and Affect: Mood normal.        Behavior: Behavior normal.     (all labs ordered are listed, but only abnormal results are displayed) Labs Reviewed  COMPREHENSIVE METABOLIC PANEL WITH GFR - Abnormal; Notable for the following components:      Result Value   Potassium 3.1 (*)    CO2 21 (*)    Glucose, Bld 102 (*)    Creatinine, Ser 1.02 (*)    Total Protein 5.7 (*)    Albumin 3.3 (*)    AST 13 (*)    All other components within normal limits  CBC WITH DIFFERENTIAL/PLATELET - Abnormal; Notable for the following components:   WBC 19.5 (*)    RBC 3.35 (*)    Hemoglobin 9.0 (*)    HCT 30.0 (*)    Platelets 864 (*)    Neutro Abs 15.7 (*)    Monocytes Absolute 1.4 (*)    Basophils Absolute 0.2 (*)    Abs Immature Granulocytes 0.29 (*)    All other components within normal limits  LIPASE, BLOOD  MAGNESIUM   URINALYSIS, ROUTINE W REFLEX  MICROSCOPIC    EKG: EKG Interpretation Date/Time:  Monday December 01 2023 14:05:49 EDT Ventricular Rate:  99 PR Interval:  124 QRS Duration:  84 QT Interval:  357 QTC Calculation: 459 R Axis:   25  Text Interpretation: Sinus rhythm Consider left atrial enlargement Low voltage, precordial leads Baseline wander in lead(s) V1 Confirmed by Melvenia Motto [phone]) on 12/01/2023 3:00:46 PM  Radiology: [facility]COLA Abd Acute W/Chest Result Date: 12/01/2023 CLINICAL DATA:  Abdominal pain. EXAM: DG ABDOMEN ACUTE WITH 1 VIEW CHEST COMPARISON:  11/25/2023 FINDINGS: Nonobstructive bowel-gas pattern. No evidence of free air. No radiopaque calculi identified. Pelvic phleboliths. Cholecystectomy clips in the right upper quadrant. Heart size and  mediastinal contours are unchanged. Hiatal hernia. Linear scarring/subsegmental atelectasis again noted at the left lung base. Aortic atherosclerosis. Levoscoliotic curvature of the thoracolumbar spine. IMPRESSION: 1. Nonobstructive bowel-gas pattern. 2. Hiatal hernia. 3. No acute cardiopulmonary findings. Linear scarring/subsegmental atelectasis again noted at the left lung base. Electronically Signed   By: Harrietta Sherry M.D.   On: 12/01/2023 14:56     Procedures   Medications Ordered in the ED  HYDROmorphone  (DILAUDID ) injection 1 mg (1 mg Intravenous Given 12/01/23 1354)  alum & mag hydroxide-simeth (MAALOX/MYLANTA) 200-200-20 MG/5ML suspension 30 mL (30 mLs Oral Given 12/01/23 1355)    And  lidocaine (XYLOCAINE) 2 % viscous mouth solution 15 mL (15 mLs Oral Given 12/01/23 1355)  famotidine (PEPCID) IVPB 20 mg premix (0 mg Intravenous Stopped 12/01/23 1431)  lactated ringers  bolus 1,000 mL (0 mLs Intravenous Stopped 12/01/23 1542)  ketamine  50 mg in normal saline 5 mL (10 mg/mL) syringe (13 mg Intravenous Given 12/01/23 1447)  prochlorperazine  (COMPAZINE ) injection 10 mg (10 mg Intravenous Given 12/01/23 1545)  potassium chloride  (KLOR-CON ) packet 40 mEq (40 mEq Oral  Given 12/01/23 1545)                                    Medical Decision Making Amount and/or Complexity of Data Reviewed Labs: ordered. Radiology: ordered.  Risk OTC drugs. Prescription drug management.   This patient presents to the ED for concern of multiple complaints, this involves an extensive number of treatment options, and is a complaint that carries with it a high risk of complications and morbidity.  The differential diagnosis includes chronic pain, opiate withdrawal, PUD, pancreatitis, enteritis   Co morbidities / Chronic conditions that complicate the patient evaluation  GERD, IBS, migraines, trigeminal neuralgia, fibromyalgia, HTN, PUD, CKD   Additional history obtained:  Additional history obtained from EMR External records from outside source obtained and reviewed including N/A   Lab Tests:  I Ordered, and personally interpreted labs.  The pertinent results include: Creatinine slightly increased at baseline, hypokalemia with otherwise normal electrolytes, leukocytosis present.  Anemia is baseline.  Lipase and hepatobiliary enzymes are normal.   Imaging Studies ordered:  I ordered imaging studies including x-ray of chest and abdomen I independently visualized and interpreted imaging which showed no acute findings I agree with the radiologist interpretation   Cardiac Monitoring: / EKG:  The patient was maintained on a cardiac monitor.  I personally viewed and interpreted the cardiac monitored which showed an underlying rhythm of: [place] rhythm   Problem List / ED Course / Critical interventions / Medication management  Patient returns for ongoing upper abdominal pain, facial pain, and nausea.  She was seen in the ED a week ago for similar symptoms.  She was admitted at that time.  Since her discharge 2 days ago, this is her second ED visit for similar complaints.  On exam, she is overall well-appearing but does appear uncomfortable.  She currently has no  prescription pain medicine at home to treat her chronic pain.  I suspect some element of opiate withdrawal given her recent hospital stay.  Dilaudid  was ordered for analgesia.  Given her known history of PUD, GI cocktail was ordered.  On reassessment, patient pain is mildly improved.  She states that ketamine  has helped with her trigeminal neuralgia.  Pain does ketamine  was ordered.  Patient's lab work is notable for hypokalemia.  Replacement potassium was ordered.  Compazine  was  ordered for ongoing nausea. Ultimately, patient did have improved symptoms and is stable for discharge home. I ordered medication including IV fluid hydration, Dilaudid , GI cocktail, ketamine  for analgesia, Compazine  for nausea, potassium chloride  for hypokalemia Reevaluation of the patient after these medicines showed that the patient improved I have reviewed the patients home medicines and have made adjustments as needed  Social Determinants of Health:  Frequent ED visits and hospitalizations     Final diagnoses:  Epigastric pain    ED Discharge Orders          Ordered    HYDROcodone -acetaminophen  (NORCO/VICODIN) 5-325 MG tablet  Every 8 hours PRN        12/01/23 1605               Melvenia Motto, MD 12/01/23 1606

## 2023-12-01 NOTE — ED Triage Notes (Signed)
 Pt in w/ c/o abd pain that has been on going since before last admission last week. States that nothing is new. C/o of pain to face d/t trigeminal neuralgia.

## 2023-12-01 NOTE — ED Notes (Signed)
 MD Dixon at bedside with ketamine  administration.

## 2023-12-01 NOTE — ED Notes (Signed)
 Pt keeps calling out, yelling out, and coming out to the desk wondering where the doctor is. MD Dixon aware .

## 2023-12-02 LAB — SURGICAL PATHOLOGY

## 2023-12-03 ENCOUNTER — Encounter (INDEPENDENT_AMBULATORY_CARE_PROVIDER_SITE_OTHER): Payer: Self-pay | Admitting: Gastroenterology

## 2023-12-03 ENCOUNTER — Encounter (HOSPITAL_COMMUNITY): Payer: Self-pay | Admitting: Internal Medicine

## 2023-12-04 ENCOUNTER — Inpatient Hospital Stay (HOSPITAL_COMMUNITY)
Admission: EM | Admit: 2023-12-04 | Discharge: 2023-12-07 | DRG: 377 | Disposition: A | Discharge status: Home / Self Care | Attending: Internal Medicine | Admitting: Internal Medicine

## 2023-12-04 ENCOUNTER — Other Ambulatory Visit: Payer: Self-pay

## 2023-12-04 ENCOUNTER — Encounter (HOSPITAL_COMMUNITY): Payer: Self-pay

## 2023-12-04 ENCOUNTER — Emergency Department (HOSPITAL_COMMUNITY)

## 2023-12-04 DIAGNOSIS — T39395A Adverse effect of other nonsteroidal anti-inflammatory drugs [NSAID], initial encounter: Secondary | ICD-10-CM | POA: Diagnosis present

## 2023-12-04 DIAGNOSIS — R1013 Epigastric pain: Secondary | ICD-10-CM | POA: Diagnosis not present

## 2023-12-04 DIAGNOSIS — N182 Chronic kidney disease, stage 2 (mild): Secondary | ICD-10-CM | POA: Diagnosis present

## 2023-12-04 DIAGNOSIS — J189 Pneumonia, unspecified organism: Secondary | ICD-10-CM | POA: Diagnosis not present

## 2023-12-04 DIAGNOSIS — K581 Irritable bowel syndrome with constipation: Secondary | ICD-10-CM | POA: Diagnosis present

## 2023-12-04 DIAGNOSIS — F17210 Nicotine dependence, cigarettes, uncomplicated: Secondary | ICD-10-CM | POA: Diagnosis present

## 2023-12-04 DIAGNOSIS — K449 Diaphragmatic hernia without obstruction or gangrene: Secondary | ICD-10-CM | POA: Diagnosis present

## 2023-12-04 DIAGNOSIS — Z881 Allergy status to other antibiotic agents status: Secondary | ICD-10-CM

## 2023-12-04 DIAGNOSIS — F1120 Opioid dependence, uncomplicated: Secondary | ICD-10-CM | POA: Diagnosis present

## 2023-12-04 DIAGNOSIS — R112 Nausea with vomiting, unspecified: Secondary | ICD-10-CM | POA: Diagnosis not present

## 2023-12-04 DIAGNOSIS — E785 Hyperlipidemia, unspecified: Secondary | ICD-10-CM | POA: Diagnosis present

## 2023-12-04 DIAGNOSIS — G894 Chronic pain syndrome: Secondary | ICD-10-CM | POA: Diagnosis present

## 2023-12-04 DIAGNOSIS — Z886 Allergy status to analgesic agent status: Secondary | ICD-10-CM

## 2023-12-04 DIAGNOSIS — R1310 Dysphagia, unspecified: Secondary | ICD-10-CM

## 2023-12-04 DIAGNOSIS — M1990 Unspecified osteoarthritis, unspecified site: Secondary | ICD-10-CM | POA: Diagnosis present

## 2023-12-04 DIAGNOSIS — F411 Generalized anxiety disorder: Secondary | ICD-10-CM | POA: Diagnosis present

## 2023-12-04 DIAGNOSIS — E876 Hypokalemia: Secondary | ICD-10-CM | POA: Diagnosis present

## 2023-12-04 DIAGNOSIS — M797 Fibromyalgia: Secondary | ICD-10-CM | POA: Diagnosis present

## 2023-12-04 DIAGNOSIS — K250 Acute gastric ulcer with hemorrhage: Secondary | ICD-10-CM | POA: Diagnosis not present

## 2023-12-04 DIAGNOSIS — I10 Essential (primary) hypertension: Secondary | ICD-10-CM | POA: Diagnosis present

## 2023-12-04 DIAGNOSIS — R7303 Prediabetes: Secondary | ICD-10-CM | POA: Diagnosis present

## 2023-12-04 DIAGNOSIS — K254 Chronic or unspecified gastric ulcer with hemorrhage: Secondary | ICD-10-CM | POA: Diagnosis not present

## 2023-12-04 DIAGNOSIS — R109 Unspecified abdominal pain: Secondary | ICD-10-CM

## 2023-12-04 DIAGNOSIS — Z888 Allergy status to other drugs, medicaments and biological substances status: Secondary | ICD-10-CM

## 2023-12-04 DIAGNOSIS — D62 Acute posthemorrhagic anemia: Secondary | ICD-10-CM | POA: Diagnosis present

## 2023-12-04 DIAGNOSIS — I129 Hypertensive chronic kidney disease with stage 1 through stage 4 chronic kidney disease, or unspecified chronic kidney disease: Secondary | ICD-10-CM | POA: Diagnosis present

## 2023-12-04 DIAGNOSIS — Z79899 Other long term (current) drug therapy: Secondary | ICD-10-CM

## 2023-12-04 DIAGNOSIS — Z808 Family history of malignant neoplasm of other organs or systems: Secondary | ICD-10-CM

## 2023-12-04 DIAGNOSIS — R1010 Upper abdominal pain, unspecified: Secondary | ICD-10-CM

## 2023-12-04 DIAGNOSIS — Z800 Family history of malignant neoplasm of digestive organs: Secondary | ICD-10-CM

## 2023-12-04 DIAGNOSIS — Z885 Allergy status to narcotic agent status: Secondary | ICD-10-CM

## 2023-12-04 DIAGNOSIS — K219 Gastro-esophageal reflux disease without esophagitis: Secondary | ICD-10-CM | POA: Diagnosis present

## 2023-12-04 DIAGNOSIS — G43909 Migraine, unspecified, not intractable, without status migrainosus: Secondary | ICD-10-CM | POA: Diagnosis present

## 2023-12-04 DIAGNOSIS — Z882 Allergy status to sulfonamides status: Secondary | ICD-10-CM

## 2023-12-04 DIAGNOSIS — Z9049 Acquired absence of other specified parts of digestive tract: Secondary | ICD-10-CM

## 2023-12-04 DIAGNOSIS — Z8052 Family history of malignant neoplasm of bladder: Secondary | ICD-10-CM

## 2023-12-04 DIAGNOSIS — Z23 Encounter for immunization: Secondary | ICD-10-CM

## 2023-12-04 DIAGNOSIS — K922 Gastrointestinal hemorrhage, unspecified: Principal | ICD-10-CM | POA: Diagnosis present

## 2023-12-04 DIAGNOSIS — G2581 Restless legs syndrome: Secondary | ICD-10-CM | POA: Diagnosis present

## 2023-12-04 LAB — COMPREHENSIVE METABOLIC PANEL WITH GFR
ALT: 7 U/L (ref 0–44)
AST: 12 U/L — ABNORMAL LOW (ref 15–41)
Albumin: 3.1 g/dL — ABNORMAL LOW (ref 3.5–5.0)
Alkaline Phosphatase: 64 U/L (ref 38–126)
Anion gap: 13 (ref 5–15)
BUN: <5 mg/dL — ABNORMAL LOW (ref 8–23)
CO2: 22 mmol/L (ref 22–32)
Calcium: 8.8 mg/dL — ABNORMAL LOW (ref 8.9–10.3)
Chloride: 107 mmol/L (ref 98–111)
Creatinine, Ser: 0.59 mg/dL (ref 0.44–1.00)
GFR, Estimated: >60 mL/min (ref >60)
Glucose, Bld: 111 mg/dL — ABNORMAL HIGH (ref 70–99)
Potassium: 2.4 mmol/L — CL (ref 3.5–5.1)
Sodium: 142 mmol/L (ref 135–145)
Total Bilirubin: <0.2 mg/dL (ref 0.0–1.2)
Total Protein: 5.7 g/dL — ABNORMAL LOW (ref 6.5–8.1)

## 2023-12-04 LAB — LIPASE, BLOOD: Lipase: 12 U/L (ref 11–51)

## 2023-12-04 LAB — CBC WITH DIFFERENTIAL/PLATELET
Abs Immature Granulocytes: 0.21 K/uL — ABNORMAL HIGH (ref 0.00–0.07)
Basophils Absolute: 0.1 K/uL (ref 0.0–0.1)
Basophils Relative: 1 %
Eosinophils Absolute: 0.1 K/uL (ref 0.0–0.5)
Eosinophils Relative: 1 %
HCT: 24.1 % — ABNORMAL LOW (ref 36.0–46.0)
Hemoglobin: 7.6 g/dL — ABNORMAL LOW (ref 12.0–15.0)
Immature Granulocytes: 1 %
Lymphocytes Relative: 11 %
Lymphs Abs: 1.8 K/uL (ref 0.7–4.0)
MCH: 27.1 pg (ref 26.0–34.0)
MCHC: 31.5 g/dL (ref 30.0–36.0)
MCV: 86.1 fL (ref 80.0–100.0)
Monocytes Absolute: 1.4 K/uL — ABNORMAL HIGH (ref 0.1–1.0)
Monocytes Relative: 8 %
Neutro Abs: 13.5 K/uL — ABNORMAL HIGH (ref 1.7–7.7)
Neutrophils Relative %: 78 %
Platelets: 743 K/uL — ABNORMAL HIGH (ref 150–400)
RBC: 2.8 MIL/uL — ABNORMAL LOW (ref 3.87–5.11)
RDW: 15.3 % (ref 11.5–15.5)
WBC: 17.1 K/uL — ABNORMAL HIGH (ref 4.0–10.5)
nRBC: 0 % (ref 0.0–0.2)

## 2023-12-04 LAB — PHOSPHORUS: Phosphorus: 2.3 mg/dL — ABNORMAL LOW (ref 2.5–4.6)

## 2023-12-04 LAB — BASIC METABOLIC PANEL WITH GFR
Anion gap: 16 — ABNORMAL HIGH (ref 5–15)
BUN: <5 mg/dL — ABNORMAL LOW (ref 8–23)
CO2: 19 mmol/L — ABNORMAL LOW (ref 22–32)
Calcium: 8.8 mg/dL — ABNORMAL LOW (ref 8.9–10.3)
Chloride: 105 mmol/L (ref 98–111)
Creatinine, Ser: 0.56 mg/dL (ref 0.44–1.00)
GFR, Estimated: >60 mL/min (ref >60)
Glucose, Bld: 86 mg/dL (ref 70–99)
Potassium: 2.7 mmol/L — CL (ref 3.5–5.1)
Sodium: 140 mmol/L (ref 135–145)

## 2023-12-04 LAB — HEMOGLOBIN AND HEMATOCRIT, BLOOD
HCT: 26.2 % — ABNORMAL LOW (ref 36.0–46.0)
HCT: 23.1 % — ABNORMAL LOW (ref 36.0–46.0)
Hemoglobin: 8.1 g/dL — ABNORMAL LOW (ref 12.0–15.0)
Hemoglobin: 7.2 g/dL — ABNORMAL LOW (ref 12.0–15.0)

## 2023-12-04 LAB — PROTIME-INR
INR: 0.9 (ref 0.8–1.2)
Prothrombin Time: 13 s (ref 11.4–15.2)

## 2023-12-04 LAB — ABO/RH: ABO/RH(D): O POS

## 2023-12-04 LAB — MAGNESIUM: Magnesium: 1.7 mg/dL (ref 1.7–2.4)

## 2023-12-04 MED ORDER — MAGNESIUM SULFATE 2 GM/50ML IV SOLN
2.0000 g | Freq: Once | INTRAVENOUS | Status: AC
Start: 2023-12-04 — End: 2023-12-04
  Administered 2023-12-04: 2 g via INTRAVENOUS
  Filled 2023-12-04: qty 50

## 2023-12-04 MED ORDER — METOCLOPRAMIDE HCL 5 MG/ML IJ SOLN
10.0000 mg | Freq: Three times a day (TID) | INTRAMUSCULAR | Status: DC
  Administered 2023-12-04 – 2023-12-07 (×9): 10 mg via INTRAVENOUS
  Filled 2023-12-04 (×9): qty 2

## 2023-12-04 MED ORDER — SODIUM CHLORIDE 0.9% FLUSH
3.0000 mL | Freq: Two times a day (BID) | INTRAVENOUS | Status: DC
  Administered 2023-12-04 – 2023-12-07 (×5): 3 mL via INTRAVENOUS

## 2023-12-04 MED ORDER — PROCHLORPERAZINE EDISYLATE 10 MG/2ML IJ SOLN: 10.0000 mg | Freq: Four times a day (QID) | INTRAMUSCULAR | Status: DC | PRN

## 2023-12-04 MED ORDER — HYDROMORPHONE HCL 1 MG/ML IJ SOLN
0.5000 mg | Freq: Once | INTRAMUSCULAR | Status: AC
  Administered 2023-12-04: 0.5 mg via INTRAVENOUS
  Filled 2023-12-04: qty 0.5

## 2023-12-04 MED ORDER — IPRATROPIUM BROMIDE 0.02 % IN SOLN: 0.5000 mg | Freq: Four times a day (QID) | RESPIRATORY_TRACT | Status: DC | PRN

## 2023-12-04 MED ORDER — ONDANSETRON HCL 4 MG/2ML IJ SOLN: 4.0000 mg | Freq: Four times a day (QID) | INTRAMUSCULAR | Status: DC | PRN

## 2023-12-04 MED ORDER — LACTATED RINGERS IV BOLUS
1000.0000 mL | Freq: Once | INTRAVENOUS | Status: AC
  Administered 2023-12-04: 1000 mL via INTRAVENOUS

## 2023-12-04 MED ORDER — TRAZODONE HCL 50 MG PO TABS
25.0000 mg | ORAL_TABLET | Freq: Every evening | ORAL | Status: DC | PRN
  Administered 2023-12-04 – 2023-12-06 (×2): 25 mg via ORAL
  Filled 2023-12-04 (×2): qty 1

## 2023-12-04 MED ORDER — POTASSIUM CHLORIDE 10 MEQ/100ML IV SOLN
10.0000 meq | INTRAVENOUS | Status: AC
  Administered 2023-12-04 (×2): 10 meq via INTRAVENOUS
  Filled 2023-12-04 (×2): qty 100

## 2023-12-04 MED ORDER — LIDOCAINE VISCOUS HCL 2 % MT SOLN
15.0000 mL | Freq: Once | OROMUCOSAL | Status: AC
  Administered 2023-12-04: 15 mL via ORAL
  Filled 2023-12-04: qty 15

## 2023-12-04 MED ORDER — HYDRALAZINE HCL 20 MG/ML IJ SOLN
10.0000 mg | INTRAMUSCULAR | Status: DC | PRN
  Administered 2023-12-04 – 2023-12-05 (×2): 10 mg via INTRAVENOUS
  Filled 2023-12-04 (×2): qty 1

## 2023-12-04 MED ORDER — POTASSIUM PHOSPHATES 15 MMOLE/5ML IV SOLN
15.0000 mmol | Freq: Once | INTRAVENOUS | Status: AC
  Administered 2023-12-04: 15 mmol via INTRAVENOUS
  Filled 2023-12-04: qty 5

## 2023-12-04 MED ORDER — PNEUMOCOCCAL 20-VAL CONJ VACC 0.5 ML IM SUSY
0.5000 mL | PREFILLED_SYRINGE | INTRAMUSCULAR | Status: AC
Start: 2023-12-05 — End: 2023-12-07
  Administered 2023-12-07: 0.5 mL via INTRAMUSCULAR
  Filled 2023-12-04: qty 0.5

## 2023-12-04 MED ORDER — SUCRALFATE 1 GM/10ML PO SUSP
1.0000 g | Freq: Three times a day (TID) | ORAL | Status: DC
  Administered 2023-12-04 – 2023-12-07 (×10): 1 g via ORAL
  Filled 2023-12-04 (×10): qty 10

## 2023-12-04 MED ORDER — FLEET ENEMA RE ENEM: 1.0000 | ENEMA | Freq: Once | RECTAL | Status: DC | PRN

## 2023-12-04 MED ORDER — BISACODYL 5 MG PO TBEC: 5.0000 mg | DELAYED_RELEASE_TABLET | Freq: Every day | ORAL | Status: DC | PRN

## 2023-12-04 MED ORDER — PANTOPRAZOLE SODIUM 40 MG IV SOLR
40.0000 mg | INTRAVENOUS | Status: AC
  Administered 2023-12-04 (×2): 40 mg via INTRAVENOUS
  Filled 2023-12-04 (×2): qty 10

## 2023-12-04 MED ORDER — METOCLOPRAMIDE HCL 5 MG/ML IJ SOLN
10.0000 mg | Freq: Once | INTRAMUSCULAR | Status: AC
  Administered 2023-12-04: 10 mg via INTRAVENOUS
  Filled 2023-12-04: qty 2

## 2023-12-04 MED ORDER — HYDROMORPHONE HCL 1 MG/ML IJ SOLN
1.0000 mg | Freq: Once | INTRAMUSCULAR | Status: AC
  Administered 2023-12-04: 1 mg via INTRAVENOUS
  Filled 2023-12-04: qty 1

## 2023-12-04 MED ORDER — SENNOSIDES-DOCUSATE SODIUM 8.6-50 MG PO TABS: 1.0000 | ORAL_TABLET | Freq: Every evening | ORAL | Status: DC | PRN

## 2023-12-04 MED ORDER — SODIUM CHLORIDE 0.9 % IV SOLN
1.0000 g | INTRAVENOUS | Status: DC
  Administered 2023-12-04 – 2023-12-07 (×3): 1 g via INTRAVENOUS
  Filled 2023-12-04 (×3): qty 10

## 2023-12-04 MED ORDER — ALUM & MAG HYDROXIDE-SIMETH 200-200-20 MG/5ML PO SUSP
30.0000 mL | Freq: Four times a day (QID) | ORAL | Status: DC | PRN
  Administered 2023-12-04: 30 mL via ORAL
  Filled 2023-12-04: qty 30

## 2023-12-04 MED ORDER — HYDROMORPHONE HCL 1 MG/ML IJ SOLN
1.0000 mg | INTRAMUSCULAR | Status: DC | PRN
  Administered 2023-12-04 (×2): 1 mg via INTRAVENOUS
  Filled 2023-12-04 (×2): qty 1

## 2023-12-04 MED ORDER — ONDANSETRON HCL 4 MG/2ML IJ SOLN
4.0000 mg | Freq: Once | INTRAMUSCULAR | Status: AC
  Administered 2023-12-04: 4 mg via INTRAVENOUS
  Filled 2023-12-04: qty 2

## 2023-12-04 MED ORDER — IOHEXOL 350 MG/ML SOLN
75.0000 mL | Freq: Once | INTRAVENOUS | Status: AC | PRN
  Administered 2023-12-04: 75 mL via INTRAVENOUS

## 2023-12-04 MED ORDER — PANTOPRAZOLE SODIUM 40 MG IV SOLR
40.0000 mg | Freq: Two times a day (BID) | INTRAVENOUS | Status: DC
  Administered 2023-12-04 – 2023-12-07 (×5): 40 mg via INTRAVENOUS
  Filled 2023-12-04 (×6): qty 10

## 2023-12-04 MED ORDER — INFLUENZA VIRUS VACC SPLIT PF (FLUZONE) 0.5 ML IM SUSY
0.5000 mL | PREFILLED_SYRINGE | INTRAMUSCULAR | Status: AC
Start: 2023-12-05 — End: 2023-12-07
  Administered 2023-12-07: 0.5 mL via INTRAMUSCULAR
  Filled 2023-12-04: qty 0.5

## 2023-12-04 MED ORDER — LABETALOL HCL 200 MG PO TABS
200.0000 mg | ORAL_TABLET | Freq: Two times a day (BID) | ORAL | Status: DC
  Administered 2023-12-04 – 2023-12-07 (×7): 200 mg via ORAL
  Filled 2023-12-04 (×7): qty 1

## 2023-12-04 MED ORDER — KETAMINE HCL 50 MG/5ML IJ SOSY
0.3000 mg/kg | PREFILLED_SYRINGE | Freq: Once | INTRAMUSCULAR | Status: AC
  Administered 2023-12-04: 13 mg via INTRAVENOUS
  Filled 2023-12-04: qty 5

## 2023-12-04 MED ORDER — HYDROMORPHONE HCL 1 MG/ML IJ SOLN
2.0000 mg | INTRAMUSCULAR | Status: DC | PRN
  Administered 2023-12-04 – 2023-12-07 (×16): 2 mg via INTRAVENOUS
  Filled 2023-12-04 (×16): qty 2

## 2023-12-04 MED ORDER — SODIUM CHLORIDE 0.9 % IV SOLN: INTRAVENOUS | Status: AC

## 2023-12-04 MED ORDER — ACETAMINOPHEN 325 MG PO TABS: 650.0000 mg | ORAL_TABLET | Freq: Four times a day (QID) | ORAL | Status: DC | PRN

## 2023-12-04 MED ORDER — DIPHENHYDRAMINE HCL 50 MG/ML IJ SOLN
12.5000 mg | Freq: Once | INTRAMUSCULAR | Status: AC
  Administered 2023-12-04: 12.5 mg via INTRAVENOUS
  Filled 2023-12-04: qty 1

## 2023-12-04 MED ORDER — DULOXETINE HCL 60 MG PO CPEP
60.0000 mg | ORAL_CAPSULE | Freq: Two times a day (BID) | ORAL | Status: DC
  Administered 2023-12-05 – 2023-12-07 (×5): 60 mg via ORAL
  Filled 2023-12-04 (×5): qty 1

## 2023-12-04 MED ORDER — HEPARIN SODIUM (PORCINE) 5000 UNIT/ML IJ SOLN: 5000.0000 [IU] | Freq: Three times a day (TID) | INTRAMUSCULAR | Status: DC

## 2023-12-04 MED ORDER — ACETAMINOPHEN 650 MG RE SUPP: 650.0000 mg | Freq: Four times a day (QID) | RECTAL | Status: DC | PRN

## 2023-12-04 MED ORDER — AMLODIPINE BESYLATE 5 MG PO TABS
5.0000 mg | ORAL_TABLET | Freq: Every day | ORAL | Status: DC
  Administered 2023-12-05 – 2023-12-07 (×3): 5 mg via ORAL
  Filled 2023-12-04 (×3): qty 1

## 2023-12-04 MED ORDER — ONDANSETRON HCL 4 MG PO TABS: 4.0000 mg | ORAL_TABLET | Freq: Four times a day (QID) | ORAL | Status: DC | PRN

## 2023-12-04 NOTE — Hospital Course (Addendum)
 Renee Gutierrez is a 63-year-old female with a history of multiple comorbidities including PUD, GERD, IBS, HTN, CKD, fibromyalgia, migraines, restless leg syndrome.. With recent admission and discharged on 11/29/2023 for intractable nausea vomiting, gastric ulcer-performing EGD on 11/28/2023.SABRASABRACame back again with nausea vomiting, hematemesis.   Reports that her symptoms of abdominal pain nausea vomiting started 3 days ago and progressively getting worse.  In past 24 hours having bloody vomitus.  Has not taken any NSAIDs and not on any blood thinners. Patient had worsening drop in hemoglobin during admission with hemoglobin of 7.6.   ED evaluation: Blood pressure (!) 174/105, pulse (!) 110, temperature 98.4 F (36.9 C), temperature source Oral, resp. rate 19, height 4' 11 (1.499 m), weight 44.5 kg, SpO2 97%. LABs: Potassium 2.4, magnesium  1.7, WBC 17.1, hemoglobin 7.6  CT angio GI bleed: IMPRESSION: 1. 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 No active GI bleeding by CTA. No acute bowel inflammation identified. 2. Small but increased left pleural effusion and associated increased left lung base atelectasis or consolidation.  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 Rest of the examination was normal.

## 2023-12-04 NOTE — Assessment & Plan Note (Signed)
-   Currently stable, anemic anticipating hypotension -Reviewing home medication holding BP meds, as needed hydralazine

## 2023-12-04 NOTE — Progress Notes (Signed)
 Date and time results received: 12/04/23 1801 (use smartphrase .now to insert current time)  Test: K+ Critical Value: 2.7  Name of Provider Notified: Shahmehdi  Orders Received? Or Actions Taken?:

## 2023-12-04 NOTE — Plan of Care (Signed)

## 2023-12-04 NOTE — Assessment & Plan Note (Addendum)
 Intractable nausea vomiting, with hematemesis - Patient received multiple antiemetic IV medication in ED -Will resume and schedule Reglan  10 mg IV 3 times daily, as needed Zofran /Phenergan  -Keep the patient n.p.o. -Gentle IV fluid hydration

## 2023-12-04 NOTE — Assessment & Plan Note (Signed)
 Continue statins when tolerating p.o.

## 2023-12-04 NOTE — Assessment & Plan Note (Signed)
-   Hematemesis-acute upper GI bleed with recent history of large gastric ulcer found on EGD on 11/28/2023 -GI consulted -appreciate evaluation recommendations -Keep patient n.p.o.

## 2023-12-04 NOTE — Assessment & Plan Note (Signed)
 BUN/creatinine at baseline, monitoring closely

## 2023-12-04 NOTE — Assessment & Plan Note (Signed)
 Monitoring CBC closely -Last A1c 5.9

## 2023-12-04 NOTE — ED Triage Notes (Signed)
 Pt came in POV with complaints of throwing up bright red blood. Pt states that she has bleeding ulcers in her stomach.

## 2023-12-04 NOTE — Assessment & Plan Note (Signed)
-   Chronic, present on imaging -Monitor closely may be exacerbating GERD, Continue Protonix

## 2023-12-04 NOTE — Progress Notes (Signed)
   12/04/23 1253  TOC Brief Assessment  Insurance and Status Reviewed  Patient has primary care physician No (PCP list added)  Home environment has been reviewed Home with Spouse  Prior level of function: Independent  Prior/Current Home Services No current home services  Social Drivers of Health Review SDOH reviewed no interventions necessary  Readmission risk has been reviewed Yes  Transition of care needs no transition of care needs at this time   PCP list added to AVS.  Inpatient Care Manager (ICM) has reviewed patient and no ICM needs have been identified at this time. We will continue to monitor patient advancement through interdisciplinary progression rounds. If new patient transition needs arise, please place a ICM consult.

## 2023-12-04 NOTE — ED Notes (Signed)
 Attempted to call and give report. Nurse was not available to get report. This nurse private chatted nurse to make sure if she has any questions.

## 2023-12-04 NOTE — Assessment & Plan Note (Signed)
 Recent hospitalization and EGD on 11/28/2023, revealing large ulcer in the first portion of the stomach distal to esophagus was thought likely due to NSAID use patient was discharged on PPI twice daily, Carafate 4 times a day  Once again presenting with hematemesis -Consulted, anticipating further evaluation and EGD -N.p.o. -IV fluids -Continue with IV Protonix , Carafate -Monitoring H&H closely

## 2023-12-04 NOTE — H&P (Signed)
 History and Physical   Patient: Renee Gutierrez                            PCP: Patient, No Pcp Per                    DOB: 05-Aug-1960            DOA: 12/04/2023 FMW:[id number]             DOS: 12/04/2023, 3:12 PM  Patient, No Pcp Per  Patient coming from:   HOME  I have personally reviewed patient's medical records, in electronic medical records, including:  [facility], and care everywhere.    Chief Complaint:   Chief Complaint  Patient presents with   Abdominal Pain    History of present illness:    Renee Gutierrez is a 63-year-old female with a history of multiple comorbidities including PUD, GERD, IBS, HTN, CKD, fibromyalgia, migraines, restless leg syndrome.. With recent admission and discharged on 11/29/2023 for intractable nausea vomiting, gastric ulcer-performing EGD on 11/28/2023... Scented back again with nausea vomiting, hematemesis.   Reports that her symptoms of abdominal pain nausea vomiting started 3 days ago and progressively getting worse.  In past 24 hours having bloody vomitus.  Has not taken any NSAIDs and not on any blood thinners.   ED evaluation: Blood pressure (!) 174/105, pulse (!) 110, temperature 98.4 F (36.9 C), temperature source Oral, resp. rate 19, height 4' 11 (1.499 m), weight 44.5 kg, SpO2 97%. LABs: Potassium 2.4, magnesium  1.7, WBC 17.1, hemoglobin 7.6  CT angio GI bleed: IMPRESSION: 1. 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 No active GI bleeding by CTA. No acute bowel inflammation identified. 2. Small but increased left pleural effusion and associated increased left lung base atelectasis or consolidation.  EDP contacted GI who see and evaluate the patient accordingly      Patient Denies having: Fever, Chills, Cough, SOB, Chest Pain,headache, dizziness, lightheadedness,  Dysuria, Joint pain, rash, open wounds    Review of Systems: As per HPI, otherwise 10 point review of systems  were negative.   ----------------------------------------------------------------------------------------------------------------------  Allergies  Allergen Reactions   Buprenorphine Hcl Swelling    All extremities and neck and face swelled up huge according to patient   Bupropion Swelling and Other (See Comments)    Moody and depressed   Codeine Itching, Nausea And Vomiting, Nausea Only and Swelling   Sulfamethoxazole-Trimethoprim Swelling and Other (See Comments)    Swelled up her eye   Nsaids Other (See Comments)    Unknown    Pregabalin  Swelling    Lyrica     Sulfa Antibiotics Other (See Comments)    Unknown    Tapentadol Nausea And Vomiting   Clindamycin Other (See Comments)    Heart burn   Clindamycin/Lincomycin Rash and Other (See Comments)    Heart burn   Cymbalta  [Duloxetine  Hcl] Swelling   Diclofenac Swelling    Cream    Diclofenac Sodium Swelling    Oral med   Gabapentin Swelling   Oxycodone  Nausea Only    No longer has a reaction per pt   Toradol  [Ketorolac  Tromethamine ] Rash    Home MEDs:  Prior to Admission medications   Medication Sig Start Date End Date Taking? Authorizing Provider  amLODipine  (NORVASC ) 5 MG tablet Take 5 mg by mouth daily. 11/24/23  Yes [provider]  diclofenac Sodium (VOLTAREN) 1 %  GEL Apply 2 g topically 4 (four) times daily. 04/18/20  Yes [provider]  DULoxetine  (CYMBALTA ) 60 MG capsule Take 60 mg by mouth 2 (two) times daily. 05/16/20  Yes [provider]  erythromycin  ophthalmic ointment Place 1 Application into the left eye 3 (three) times daily.   Yes [provider]  HYDROmorphone  (DILAUDID ) 4 MG tablet Take 4 mg by mouth every 4 (four) hours as needed for severe pain (pain score 7-10).   Yes [provider]  labetalol  (NORMODYNE ) 200 MG tablet Take 200 mg by mouth 2 (two) times daily.   Yes [provider]  lisinopril  (PRINIVIL ,ZESTRIL ) 40 MG tablet Take 40 mg by mouth daily.  01/31/14 12/04/23 Yes [provider]  ondansetron  (ZOFRAN -ODT) 8 MG disintegrating tablet Take 1 tablet (8 mg total) by mouth every 6 (six) hours as needed for nausea or vomiting. 11/29/23  Yes Darci Pore, MD  pantoprazole  (PROTONIX ) 40 MG tablet Take 1 tablet (40 mg total) by mouth 2 (two) times daily. 11/29/23  Yes Darci Pore, MD  potassium chloride  SA (KLOR-CON  M) 20 MEQ tablet Take 2 tablets (40 mEq total) by mouth daily. Patient taking differently: Take 20 mEq by mouth daily. 11/29/23  Yes Darci Pore, MD  prednisoLONE  acetate (PRED FORTE ) 1 % ophthalmic suspension Place 1 drop into the left eye in the morning and at bedtime. 03/01/20  Yes [provider]  rOPINIRole  (REQUIP ) 2 MG tablet Take 4 mg by mouth at bedtime. 11/11/14  Yes [provider]  simethicone (GAS RELIEF EXTRA STRENGTH) 125 MG chewable tablet Chew 125 mg by mouth daily as needed for flatulence.  05/24/14  Yes [provider]  HYDROcodone -acetaminophen  (NORCO/VICODIN) 5-325 MG tablet Take 2 tablets by mouth every 8 (eight) hours as needed for up to 3 days. Patient not taking: Reported on 12/04/2023 12/01/23 12/04/23  Melvenia Motto, MD  sucralfate (CARAFATE) 1 GM/10ML suspension Take 10 mLs (1 g total) by mouth every 6 (six) hours. 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   Benign tumor of adrenal gland    Benign tumor of adrenal gland    Fibromyalgia    GERD (gastroesophageal reflux disease)    Hiatal hernia    Hyperlipidemia    Hypertension    IBS (irritable bowel syndrome)     Migraines    S/P Nissen fundoplication (without gastrostomy tube) procedure    Sciatica    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    Past Surgical History:  Procedure Laterality Date   ABDOMINAL HYSTERECTOMY     ABDOMINAL SURGERY     tumor removed   APPENDECTOMY     BRAIN SURGERY     to fix AVM   CEREBRAL ANEURYSM REPAIR     CERVICAL FUSION     CESAREAN SECTION     CHOLECYSTECTOMY     ESOPHAGEAL DILATION N/A 11/28/2023   Procedure: DILATION, ESOPHAGUS;  Surgeon: Cindie Carlin POUR, DO;  Location: [facility];  Service: Endoscopy;  Laterality: N/A;   ESOPHAGOGASTRODUODENOSCOPY N/A 11/28/2023   Procedure: EGD (ESOPHAGOGASTRODUODENOSCOPY);  Surgeon: Cindie Carlin POUR, DO;  Location: [facility];  Service: Endoscopy;  Laterality: N/A;  LUNG SURGERY     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     reports that she has been smoking cigarettes. She has never used smokeless tobacco. She reports that she does not drink alcohol and does not use drugs.   Family History  Problem Relation Age of Onset   Throat cancer Mother    Bladder Cancer Father    Liver cancer Father    Colon cancer Neg Hx     Physical Exam:   Vitals:   12/04/23 1115 12/04/23 1130 12/04/23 1345 12/04/23 1500  BP: (!) 185/105 (!) 174/105 (!) 187/82 (!) 187/81  Pulse: (!) 107 (!) 110 (!) 104 (!) 109  Resp: 17 19 15 19   Temp:    98.2 F (36.8 C)  TempSrc:      SpO2: 98% 97% 98% 99%  Weight:      Height:       Constitutional: NAD, calm, comfortable Eyes: PERRL, lids and conjunctivae normal ENMT: Mucous membranes are moist. Posterior pharynx clear of any exudate or lesions.Normal dentition.  Neck: normal, supple, no masses, no thyromegaly Respiratory: clear to auscultation bilaterally, no wheezing, no crackles. Normal respiratory effort. No accessory muscle use.  Cardiovascular: Regular rate and rhythm, no murmurs / rubs / gallops. No extremity edema.  2+ pedal pulses. No carotid bruits.  Abdomen: Epigastric tenderness, no masses palpated. No hepatosplenomegaly. Bowel sounds positive.  Musculoskeletal: no clubbing / cyanosis. No joint deformity upper and lower extremities. Good ROM, no contractures. Normal muscle tone.  Neurologic: CN II-XII grossly intact. Sensation intact, DTR normal. Strength 5/5 in all 4.  Psychiatric: Normal judgment and insight. Alert and oriented x 3. Normal mood.  Skin: no rashes, lesions, ulcers. No induration      Labs on admission:    I have personally reviewed following labs and imaging studies  CBC: Recent Labs  Lab 11/28/23 0525 11/29/23 0407 12/01/23 1413 12/04/23 0805 12/04/23 1423  WBC 14.7* 12.9* 19.5* 17.1*  --   NEUTROABS  --   --  15.7* 13.5*  --   HGB 9.6* 9.8* 9.0* 7.6* 8.1*  HCT 31.3* 31.3* 30.0* 24.1* 26.2*  MCV 88.7 87.4 89.6 86.1  --   PLT 823* 865* 864* 743*  --    Basic Metabolic Panel: Recent Labs  Lab 11/28/23 0525 11/29/23 0407 12/01/23 1413 12/04/23 0805  NA 140 138 142 142  K 3.5 3.2* 3.1* 2.4*  CL 101 103 108 107  CO2 23 23 21* 22  GLUCOSE 122* 110* 102* 111*  BUN 6* 8 12 <5*  CREATININE 0.68 0.75 1.02* 0.59  CALCIUM  9.2 9.5 9.5 8.8*  MG  --   --  2.0 1.7  PHOS  --   --   --  2.3*   GFR: Estimated Creatinine Clearance: 48.5 mL/min (by C-G formula based on SCr of 0.59 mg/dL). Liver Function Tests: Recent Labs  Lab 12/01/23 1413 12/04/23 0805  AST 13* 12*  ALT 7 7  ALKPHOS 84 64  BILITOT <0.2 <0.2  PROT 5.7* 5.7*  ALBUMIN 3.3* 3.1*   Recent Labs  Lab 12/01/23 1413 12/04/23 0805  LIPASE 23 12   No results for input(s): AMMONIA in the last 168 hours. Coagulation Profile: Recent Labs  Lab 12/04/23 0805  INR 0.9    Urine analysis:    Component Value Date/Time   COLORURINE STRAW (A) 11/25/2023 2053   APPEARANCEUR CLEAR 11/25/2023 2053   LABSPEC 1.011 11/25/2023 2053  PHURINE 7.0 11/25/2023 2053   GLUCOSEU NEGATIVE 11/25/2023 2053    HGBUR NEGATIVE 11/25/2023 2053   BILIRUBINUR NEGATIVE 11/25/2023 2053   KETONESUR 20 (A) 11/25/2023 2053   PROTEINUR NEGATIVE 11/25/2023 2053   UROBILINOGEN 0.2 12/03/2014 1500   NITRITE NEGATIVE 11/25/2023 2053   LEUKOCYTESUR NEGATIVE 11/25/2023 2053    Last A1C:  No results found for: HGBA1C   Radiologic Exams on Admission:   CT ANGIO GI BLEED Result Date: 12/04/2023 EXAM: CTA ABDOMEN AND PELVIS WITH CONTRAST 12/04/2023 09:59:01 AM TECHNIQUE: CTA images of the abdomen and pelvis with intravenous contrast. Three-dimensional MIP/volume rendered formations were performed. Automated exposure control, iterative reconstruction, and/or weight based adjustment of the mA/kV was utilized to reduce the radiation dose to as low as reasonably achievable. COMPARISON: CTA chest and CT abdomen and pelvis 11/25/2023. CLINICAL HISTORY: 63 year old female with abdominal pain, hematemesis, and suspected bleeding ulcers. FINDINGS: VASCULATURE: Stable aortoiliac atherosclerosis. Major arterial structures are patent and stable. Portal venous system appears patent on the delayed images. GI BLEED: No contrast extravasation identified into the stomach or duodenum, or elswhere. ABDOMEN/PELVIS: LOWER CHEST: Small but increased layering left pleural effusion with simple fluid density. Associated increased left lung base atelectasis or consolidation. No pericardial effusion. Right lung base more stable with curvilinear scarring or atelectasis. LIVER: The liver is unremarkable. GALLBLADDER AND BILE DUCTS: Chronic cholecystectomy. Chronic post cholecystectomy CBD enlargement is stable. SPLEEN: The spleen is unremarkable. PANCREAS: The pancreas is unremarkable. ADRENAL GLANDS: Bilateral adrenal glands demonstrate no acute abnormality. KIDNEYS, URETERS AND BLADDER: Symmetric renal enhancement and early contrast excretion. No stones in the kidneys or ureters. No hydronephrosis. No perinephric or periureteral stranding. Urinary  bladder is unremarkable. GI AND BOWEL: Large gastrocaudal hernia redemonstrated. Evidence of chronic fundoplication and herniated appearance of the fundoplication. No definite acute gastroduodenal inflammation. Non dilated large and small bowel loops. There is no bowel obstruction. No abnormal bowel wall thickening or distension. REPRODUCTIVE: Reproductive organs are unremarkable. PERITONEUM AND RETROPERITONEUM: No pneumoperitoneum. No free fluid identified. LYMPH NODES: No lymphadenopathy. BONES AND SOFT TISSUES: Chronic spine degeneration in the setting of advanced levoconvex scoliosis. No acute abnormality of the bones. No acute soft tissue abnormality. IMPRESSION: 1. 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 No active GI bleeding by CTA. No acute bowel inflammation identified. 2. Small but increased left pleural effusion and associated increased left lung base atelectasis or consolidation. Electronically signed by: Helayne Hurst MD 12/04/2023 10:11 AM EDT RP Workstation: [id number]    EKG:   Independently reviewed.  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  Nausea & vomiting Intractable nausea vomiting, with hematemesis - Patient received multiple antiemetic IV medication in ED -Will resume and schedule  Reglan  10 mg IV 3 times daily, as needed Zofran /Phenergan  -Keep the patient n.p.o. -Gentle IV fluid hydration  Hypokalemia - Due to hyperemesis -Serum potassium 2.4, magnesium  1 7 -Replacing both potassium and magnesium  IV  Acute gastric ulcer with hemorrhage Recent hospitalization and EGD on 11/28/2023, revealing large ulcer in the first portion of the stomach distal to esophagus was thought likely due to NSAID use patient was discharged on PPI twice daily, Carafate 4 times a day  Once again presenting with hematemesis -Consulted, anticipating further evaluation and EGD -N.p.o. -IV fluids -Continue with IV Protonix , Carafate -Monitoring H&H closely  Hyperlipidemia Continue statins when tolerating p.o.  GERD (gastroesophageal reflux disease) Continue Protonix  40 mg IV twice daily -Carafate p.o. 3 times daily if tolerated   Acute post-hemorrhagic anemia - Trend H&H closely, her hemoglobin dropped from 9-7.6 - If continue to be symptomatic anticipating transfusing 1U PRBC - Repeating H&H    Latest Ref Rng & Units 12/04/2023    8:05 AM 12/01/2023    2:13 PM 11/29/2023    4:07 AM  CBC  WBC 4.0 - 10.5 K/uL 17.1  19.5  12.9   Hemoglobin 12.0 - 15.0 g/dL 7.6  9.0  9.8   Hematocrit 36.0 - 46.0 % 24.1  30.0  31.3   Platelets 150 - 400 K/uL 743  864  865      Essential hypertension - Currently stable, anemic anticipating hypotension -Reviewing home medication holding BP meds, as needed hydralazine   Esophageal hiatal hernia - Chronic, present on imaging -Monitor closely may be exacerbating GERD, Continue Protonix   GAD (generalized anxiety disorder) - Currently stable, continue as needed Xanax as needed IV Ativan or  Haldol  Prediabetes Monitoring CBC closely -Last A1c 5.9  Kidney disease, chronic, stage II (mild, 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 Level of care: Telemetry   Family Communication:  none at bedside  (The above findings and plan of care has been discussed with patient in detail, the patient expressed understanding and agreement of above plan)  -------------------------------------------------------------------------------------------------------------------------------------------------  Disposition Plan:  Anticipated 1-2 days Status is: Observation The patient remains OBS appropriate and will d/c before 2 midnights.     ----------------------------------------------------------------------------------------------------------------------------------------------------  Time spent:  42  Min.  Was spent seeing and evaluating the patient, reviewing all medical records, drawn plan of care.  SIGNED: Adriana DELENA Grams, MD, FHM. FAAFP. [facility], Pager  (Please use [email or web address] to page/ or secure chat through epic) If 7PM-7AM, please contact night-coverage [email or web address],  12/04/2023, 3:12 PM

## 2023-12-04 NOTE — Consult Note (Signed)
 Gastroenterology Consult   Referring Provider: No ref. provider found Primary Care Physician:  Patient, No Pcp Per Primary Gastroenterologist:  remotely seen at [facility], Dr Cinderella locally  Patient ID: Renee Gutierrez; [id number]; 12-22-1960   Admit date: 12/04/2023  LOS: 0 days   Date of Consultation: 12/04/2023  Reason for Consultation:  GI bleed    History of Present Illness   Renee Gutierrez is a 63 y.o. 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 She was discharged 11/29/2023 from recent hospitalization. This is her third presentation to ED since last admission.      ED course: Systolic BP 162-190, current 188 Pulse 93-115, current 102 O2 98% on RA T 98.4 K 2.4, Mg 1.7 WBC 17.1 (19.5 on 12/01/23) Hgb 7.6 (9.0 on 12/01/23) Plt 743 (864 on 12/01/23)  CTA GI Bleed: chronic GEJ fundoplication is herniated, subsequent large hiatal hernia is stable. No active GI bleeding by CTA. Small but increase left pleural effusion and associated increased left lung base atelectasis or consolidation.  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    EGD with findings as outlined below. She was advised no NSAIDs. Pantoprazole  was increased to BID.   She continues to have epigastric pain,  worse with meals. Epigastric pain described as severe. Not finding any relief. Has heartburn, somewhat improved with increased pantoprazole  but persistent. Last episode of vomiting was yesterday. Continues to consume liquid diet and soups. Last solid food several weeks ago. Previously reported dysphagia to solids, liquids, pills. Last episode of vomiting yesterday with hematemesis.   She notes, baseline constipation her whole life. Does not like laxatives. Makes her feel bad. 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 Goes to pain management. She no longer takes Excedrin  migraine. Was taking ibuprofen 200mg  tid but stopped after that admission. She has been having watery black stools few times daily for the past week which is unusual for her. No brbpr.    EGD 11/28/2023: -LA Grade D erosive esophagitis with no bleeding -Non-bleeding gastric ulcer (20mm in largest dimension) with a flat pigmented spot (Forrest Class IIc). Injected. Ulcer bed coated with PuraStat. -Gastritis with diffuse erosions, erythema and friability entire stomach. Biopsied. Mild nonspecific reactive gastropathy. Neg for H.pylori -medium sized hiatal hernia -Normal duodenal bulb, first portion of duodenum and second portion of duodenum. -recommended PPI BID, carafate QID -recommended No NSAIDs -plan for EGD 8 weeks.  Last EGD/colonoscopy [facility] 2013: Op notes not available/path as outlined Duodenal bx: patchy gastric foveolar metaplasia and mild non-specific reactive/reparative changes Stomach bx: gastric antral and body mucosa with no path dx, no gastrisit. No h.pylori.  Colon polyp:hyperplastic   Prior to Admission medications   Medication Sig Start Date End Date Taking? Authorizing Provider  amLODipine  (NORVASC ) 5 MG tablet Take 5 mg by mouth daily. 11/24/23  Yes [provider]  diclofenac  Sodium (VOLTAREN) 1 % GEL Apply 2 g topically 4 (four) times daily. 04/18/20  Yes [provider]  DULoxetine  (CYMBALTA ) 60 MG capsule Take 60 mg by mouth 2 (two) times daily. 05/16/20  Yes [provider]  erythromycin  ophthalmic ointment Place 1 Application into the left eye 3 (three) times daily.   Yes [provider]  HYDROmorphone  (DILAUDID ) 4 MG tablet Take 4 mg by mouth every 4 (four) hours as needed for severe pain (pain score 7-10).   Yes [provider]  labetalol  (NORMODYNE ) 200 MG tablet Take 200 mg by mouth 2 (two) times daily.   Yes [provider]  lisinopril  (PRINIVIL ,ZESTRIL ) 40 MG tablet Take 40 mg by mouth daily. 01/31/14 12/04/23 Yes [provider]  ondansetron  (ZOFRAN -ODT) 8 MG disintegrating tablet Take 1 tablet (8 mg total) by mouth every 6 (six) hours as needed for nausea or vomiting. 11/29/23  Yes Darci Pore, MD  pantoprazole  (PROTONIX ) 40 MG tablet Take 1 tablet (40 mg total) by mouth 2 (two) times daily. 11/29/23  Yes Darci Pore, MD  potassium chloride  SA (KLOR-CON  M) 20 MEQ tablet Take 2 tablets (40 mEq total) by mouth daily. Patient taking differently: Take 20 mEq by mouth daily. 11/29/23  Yes Darci Pore, MD  prednisoLONE  acetate (PRED FORTE ) 1 % ophthalmic suspension Place 1 drop into the left eye in the morning and at bedtime. 03/01/20  Yes [provider]  rOPINIRole  (REQUIP ) 2 MG tablet Take 4 mg by mouth at bedtime. 11/11/14  Yes [provider]  simethicone (GAS RELIEF EXTRA STRENGTH) 125 MG chewable tablet Chew 125 mg by mouth daily as needed for flatulence.  05/24/14  Yes [provider]      12/04/23  Melvenia Motto, MD  sucralfate (CARAFATE) 1 GM/10ML suspension Take 10 mLs (1 g total) by mouth every 6 (six) hours. Patient not taking: Reported on 12/04/2023 11/29/23   Cleotilde Rogue, MD    Current Facility-Administered Medications  Medication Dose Route  Frequency Provider Last Rate Last Admin   magnesium  sulfate IVPB 2 g 50 mL  2 g Intravenous Once Yolande Lamar BROCKS, MD       pantoprazole  (PROTONIX ) injection 40 mg  40 mg Intravenous Q12H Yolande Lamar BROCKS, MD       potassium chloride  10 mEq in 100 mL IVPB  10 mEq Intravenous Q1 Hr x 2 Yolande Lamar BROCKS, MD 100 mL/hr at 12/04/23 1011 10 mEq at 12/04/23 1011   Current Outpatient Medications  Medication Sig Dispense Refill   amLODipine  (NORVASC ) 5 MG tablet Take 5 mg by mouth daily.     diclofenac Sodium (VOLTAREN) 1 % GEL Apply 2 g topically 4 (four) times daily.     DULoxetine  (CYMBALTA ) 60 MG capsule Take 60 mg by mouth 2 (two) times daily.     erythromycin  ophthalmic ointment Place 1 Application into the left eye 3 (three) times daily.     HYDROmorphone  (DILAUDID ) 4 MG tablet Take 4 mg by mouth every 4 (four) hours as needed for severe pain (pain score 7-10).     labetalol  (NORMODYNE ) 200 MG tablet Take 200 mg by mouth 2 (two) times daily.     lisinopril  (PRINIVIL ,ZESTRIL ) 40 MG tablet Take 40 mg by mouth daily.     ondansetron  (ZOFRAN -ODT) 8 MG disintegrating tablet Take 1 tablet (8 mg total) by mouth every 6 (six) hours as needed for nausea or vomiting. 20 tablet 0   pantoprazole  (PROTONIX )  40 MG tablet Take 1 tablet (40 mg total) by mouth 2 (two) times daily. 60 tablet 2   potassium chloride  SA (KLOR-CON  M) 20 MEQ tablet Take 2 tablets (40 mEq total) by mouth daily. (Patient taking differently: Take 20 mEq by mouth daily.) 20 tablet 0   prednisoLONE  acetate (PRED FORTE ) 1 % ophthalmic suspension Place 1 drop into the left eye in the morning and at bedtime.     rOPINIRole  (REQUIP ) 2 MG tablet Take 4 mg by mouth at bedtime.  0   simethicone (GAS RELIEF EXTRA STRENGTH) 125 MG chewable tablet Chew 125 mg by mouth daily as needed for flatulence.      HYDROcodone -acetaminophen  (NORCO/VICODIN) 5-325 MG tablet Take 2 tablets by mouth every 8 (eight) hours as needed for up to 3 days. (Patient not  taking: Reported on 12/04/2023) 9 tablet 0   sucralfate (CARAFATE) 1 GM/10ML suspension Take 10 mLs (1 g total) by mouth every 6 (six) hours. (Patient not taking: Reported on 12/04/2023) 420 mL 0    Allergies as of 12/04/2023 - Review Complete 12/04/2023  Allergen Reaction Noted   Buprenorphine hcl Swelling 12/11/2016   Bupropion Swelling and Other (See Comments) 04/29/2017   Codeine Itching, Nausea And Vomiting, Nausea Only, and Swelling 12/03/2014   Sulfamethoxazole-trimethoprim Swelling and Other (See Comments) 04/05/2022   Nsaids Other (See Comments) 05/30/2020   Pregabalin  Swelling 12/06/2009   Sulfa antibiotics Other (See Comments) 05/30/2020   Tapentadol Nausea And Vomiting 04/17/2023   Clindamycin Other (See Comments) 08/25/2015   Clindamycin/lincomycin Rash and Other (See Comments) 08/25/2015   Cymbalta  [duloxetine  hcl] Swelling 08/31/2011   Diclofenac Swelling 10/09/2010   Diclofenac sodium Swelling 10/09/2010   Gabapentin Swelling 12/06/2009   Oxycodone  Nausea Only 08/07/2021   Toradol  [ketorolac  tromethamine ] Rash 05/18/2016    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    Past Surgical History:  Procedure Laterality Date   ABDOMINAL HYSTERECTOMY     ABDOMINAL SURGERY     tumor removed   APPENDECTOMY     BRAIN SURGERY     to fix AVM   CEREBRAL ANEURYSM REPAIR     CERVICAL FUSION     CESAREAN SECTION     CHOLECYSTECTOMY     ESOPHAGEAL DILATION N/A 11/28/2023   Procedure: DILATION, ESOPHAGUS;  Surgeon: Cindie Carlin POUR, DO;   Location: [facility];  Service: Endoscopy;  Laterality: N/A;   ESOPHAGOGASTRODUODENOSCOPY N/A 11/28/2023   Procedure: EGD (ESOPHAGOGASTRODUODENOSCOPY);  Surgeon: Cindie Carlin POUR, DO;  Location: [facility];  Service: Endoscopy;  Laterality: N/A;   LUNG SURGERY     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    Family History  Problem Relation Age of Onset   Throat cancer Mother    Bladder Cancer Father    Liver cancer Father    Colon cancer Neg Hx     Social History   Socioeconomic History  Marital status: Married    Spouse name: Not on file   Number of children: Not on file   Years of education: Not on file   Highest education level: Not on file  Occupational History   Not on file  Tobacco Use   Smoking status: Every Day    Types: Cigarettes   Smokeless tobacco: Never   Tobacco comments:    1 cigarette daily   Vaping Use   Vaping status: Never Used  Substance and Sexual Activity   Alcohol use: No   Drug use: No   Sexual activity: Yes    Birth control/protection: Surgical  Other Topics Concern   Not on file  Social History Narrative   Not on file   Social Drivers of Health   Financial Resource Strain: Not on file  Food Insecurity: Food Insecurity Present (11/26/2023)   Hunger Vital Sign    Worried About Running Out of Food in the Last Year: Sometimes true    Ran Out of Food in the Last Year: Sometimes true  Transportation Needs: No Transportation Needs (11/26/2023)   PRAPARE - Administrator, Civil Service (Medical): No    Lack of Transportation (Non-Medical): No  Physical Activity: Not on file  Stress: Not on file  Social Connections: Not on file  Intimate Partner Violence: Not At Risk (11/26/2023)   Humiliation, Afraid, Rape, and Kick questionnaire    Fear of Current or Ex-Partner: No    Emotionally Abused: No    Physically Abused: No    Sexually Abused: No     Review of System:   General: Negative for    fever, chills, see hpi Eyes: Negative for vision changes.  ENT: Negative for hoarseness, difficulty swallowing , nasal congestion. Chronic left facial pain CV: Negative for chest pain, angina, palpitations, dyspnea on exertion, peripheral edema.  Respiratory: Negative for dyspnea at rest, dyspnea on exertion, cough, sputum, wheezing.  GI: See history of present illness. GU:  Negative for dysuria, hematuria, urinary incontinence, urinary frequency, nocturnal urination.  MS: Negative for low back pain. +left shoulder pain Derm: Negative for rash or itching.  Neuro: Negative for weakness, abnormal sensation, seizure, frequent headaches, memory loss, confusion.  Psych: Negative for   suicidal ideation, hallucinations. +a/d Endo: Negative for unusual weight change.  Heme: Negative for bruising or bleeding. Allergy: Negative for rash or hives.      Physical Examination:   Vital signs in last 24 hours: Temp:  [98.4 F (36.9 C)] 98.4 F (36.9 C) (10/16 0737) Pulse Rate:  [93-115] 102 (10/16 1015) Resp:  [15-24] 16 (10/16 1015) BP: (162-190)/(66-93) 188/81 (10/16 1015) SpO2:  [98 %-100 %] 98 % (10/16 1015) Weight:  [44.5 kg] 44.5 kg (10/16 0737)    General: Well-nourished, well-developed who is laying on her side, husband at her side. Appears uncomfortable. Head: Normocephalic, atraumatic.   Eyes: Conjunctiva pink, no icterus. Left eye closed, chronic finding Mouth: Oropharyngeal mucosa dry and pink , no lesions erythema or exudate. Neck: Supple without thyromegaly, masses, or lymphadenopathy.  Lungs: Clear to auscultation bilaterally.  Heart: Regular rate and rhythm, no murmurs rubs or gallops.  Abdomen: Bowel sounds are normal,   nondistended, no hepatosplenomegaly or masses, no abdominal bruits or hernia , no rebound or guarding.  Moderate epigastric tenderness Rectal: not performed Extremities: No lower extremity edema, clubbing, deformity.  Neuro: Alert and oriented x 4 , grossly  normal neurologically.  Skin: Warm and dry, no rash or jaundice.   Psych:  Alert and cooperative, normal mood and affect.        Intake/Output from previous day: No intake/output data recorded. Intake/Output this shift: No intake/output data recorded.  Lab Results:   CBC Recent Labs    12/01/23 1413 12/04/23 0805  WBC 19.5* 17.1*  HGB 9.0* 7.6*  HCT 30.0* 24.1*  MCV 89.6 86.1  PLT 864* 743*   BMET Recent Labs    12/01/23 1413 12/04/23 0805  NA 142 142  K 3.1* 2.4*  CL 108 107  CO2 21* 22  GLUCOSE 102* 111*  BUN 12 <5*  CREATININE 1.02* 0.59  CALCIUM  9.5 8.8*   LFT Recent Labs    12/01/23 1413 12/04/23 0805  BILITOT <0.2 <0.2  ALKPHOS 84 64  AST 13* 12*  ALT 7 7  PROT 5.7* 5.7*  ALBUMIN 3.3* 3.1*    Lipase Recent Labs    12/01/23 1413 12/04/23 0805  LIPASE 23 12    PT/INR Recent Labs    12/04/23 0805  LABPROT 13.0  INR 0.9     Hepatitis Panel No results for input(s): HEPBSAG, HCVAB, HEPAIGM, HEPBIGM in the last 72 hours.   Imaging Studies:   CT ANGIO GI BLEED Result Date: 12/04/2023 EXAM: CTA ABDOMEN AND PELVIS WITH CONTRAST 12/04/2023 09:59:01 AM TECHNIQUE: CTA images of the abdomen and pelvis with intravenous contrast. Three-dimensional MIP/volume rendered formations were performed. Automated exposure control, iterative reconstruction, and/or weight based adjustment of the mA/kV was utilized to reduce the radiation dose to as low as reasonably achievable. COMPARISON: CTA chest and CT abdomen and pelvis 11/25/2023. CLINICAL HISTORY: 63 year old female with abdominal pain, hematemesis, and suspected bleeding ulcers. FINDINGS: VASCULATURE: Stable aortoiliac atherosclerosis. Major arterial structures are patent and stable. Portal venous system appears patent on the delayed images. GI BLEED: No contrast extravasation identified into the stomach or duodenum, or elswhere. ABDOMEN/PELVIS: LOWER CHEST: Small but increased layering left pleural  effusion with simple fluid density. Associated increased left lung base atelectasis or consolidation. No pericardial effusion. Right lung base more stable with curvilinear scarring or atelectasis. LIVER: The liver is unremarkable. GALLBLADDER AND BILE DUCTS: Chronic cholecystectomy. Chronic post cholecystectomy CBD enlargement is stable. SPLEEN: The spleen is unremarkable. PANCREAS: The pancreas is unremarkable. ADRENAL GLANDS: Bilateral adrenal glands demonstrate no acute abnormality. KIDNEYS, URETERS AND BLADDER: Symmetric renal enhancement and early contrast excretion. No stones in the kidneys or ureters. No hydronephrosis. No perinephric or periureteral stranding. Urinary bladder is unremarkable. GI AND BOWEL: Large gastrocaudal hernia redemonstrated. Evidence of chronic fundoplication and herniated appearance of the fundoplication. No definite acute gastroduodenal inflammation. Non dilated large and small bowel loops. There is no bowel obstruction. No abnormal bowel wall thickening or distension. REPRODUCTIVE: Reproductive organs are unremarkable. PERITONEUM AND RETROPERITONEUM: No pneumoperitoneum. No free fluid identified. LYMPH NODES: No lymphadenopathy. BONES AND SOFT TISSUES: Chronic spine degeneration in the setting of advanced levoconvex scoliosis. No acute abnormality of the bones. No acute soft tissue abnormality. IMPRESSION: 1. 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 No active GI bleeding by CTA. No acute bowel inflammation identified. 2. Small but increased left pleural effusion and associated increased left lung base atelectasis or consolidation. Electronically signed by: Helayne Hurst MD 12/04/2023 10:11 AM EDT RP Workstation: [id number]   DG Abd Acute W/Chest Result Date: 12/01/2023 CLINICAL DATA:  Abdominal pain. EXAM: DG ABDOMEN ACUTE WITH 1 VIEW CHEST COMPARISON:  11/25/2023 FINDINGS: Nonobstructive bowel-gas pattern. No evidence of free air. No  radiopaque calculi identified. Pelvic phleboliths. Cholecystectomy clips in  the right upper quadrant. Heart size and mediastinal contours are unchanged. Hiatal hernia. Linear scarring/subsegmental atelectasis again noted at the left lung base. Aortic atherosclerosis. Levoscoliotic curvature of the thoracolumbar spine. IMPRESSION: 1. Nonobstructive bowel-gas pattern. 2. Hiatal hernia. 3. No acute cardiopulmonary findings. Linear scarring/subsegmental atelectasis again noted at the left lung base. Electronically Signed   By: Harrietta Sherry M.D.   On: 12/01/2023 14:56   CT Angio Chest PE W and/or Wo Contrast Result Date: 11/25/2023 CLINICAL DATA:  Pulmonary embolus suspected with high probability. Recent discharge from [facility] for low potassium, pneumonia, and bacterial infection of GI tract. Acute nonlocalized abdominal pain. EXAM: CT ANGIOGRAPHY CHEST CT ABDOMEN AND PELVIS WITH CONTRAST TECHNIQUE: Multidetector CT imaging of the chest was performed using the standard protocol during bolus administration of intravenous contrast. Multiplanar CT image reconstructions and MIPs were obtained to evaluate the vascular anatomy. Multidetector CT imaging of the abdomen and pelvis was performed using the standard protocol during bolus administration of intravenous contrast. RADIATION DOSE REDUCTION: This exam was performed according to the departmental dose-optimization program which includes automated exposure control, adjustment of the mA and/or kV according to patient size and/or use of iterative reconstruction technique. CONTRAST:  OMNIPAQUE  IOHEXOL  350 MG/ML SOLN COMPARISON:  Chest radiograph 11/25/2023. CT abdomen and pelvis 09/20/2023. CT chest 07/05/2011 FINDINGS: CTA CHEST FINDINGS Cardiovascular: Technically adequate study with good opacification of the central and segmental pulmonary arteries. Moderate motion artifact. No focal filling defects. No evidence of significant pulmonary embolus.  Cardiac enlargement. Small pericardial effusion. Normal caliber thoracic aorta. No aortic dissection. Great vessel origins are patent. Calcification of the aorta and coronary arteries. Mediastinum/Nodes: Large esophageal hiatal hernia. Esophagus is decompressed. No significant lymphadenopathy. Thyroid gland is unremarkable. Lungs/Pleura: Trace left pleural effusion. Atelectasis in the left base. No pneumothorax. Musculoskeletal: Postoperative changes in the cervical spine. Degenerative changes in the thoracic spine. No acute bony abnormalities. Review of the MIP images confirms the above findings. CT ABDOMEN and PELVIS FINDINGS Hepatobiliary: No focal liver abnormality is seen. Status post cholecystectomy. No biliary dilatation. Pancreas: Unremarkable. No pancreatic ductal dilatation or surrounding inflammatory changes. Spleen: Normal in size without focal abnormality. Adrenals/Urinary Tract: Adrenal glands are unremarkable. Kidneys are normal, without renal calculi, focal lesion, or hydronephrosis. Bladder is unremarkable. Stomach/Bowel: Stomach, small bowel, and colon are not abnormally distended. Under distention limits evaluation but there is evidence of wall thickening in the rectosigmoid region which likely indicates colitis. This may represent infectious or inflammatory etiologies. No abscess identified. Appendix is not visualized. Vascular/Lymphatic: Aortic atherosclerosis. No enlarged abdominal or pelvic lymph nodes. Reproductive: Status post hysterectomy. No adnexal masses. Other: No free air or free fluid in the abdomen. Abdominal wall musculature appears intact. Edema in the subcutaneous fat. Musculoskeletal: Lumbar scoliosis convex towards the left. Degenerative changes in the lumbar spine. No acute bony abnormalities. Review of the MIP images confirms the above findings. IMPRESSION: 1. No evidence of significant pulmonary embolus. 2. Large esophageal hiatal hernia. 3. Small left pleural effusion with  basilar atelectasis. 4. Aortic atherosclerosis. 5. Wall thickening in the rectosigmoid colon suggesting colitis. No abscess. No proximal obstruction. Electronically Signed   By: Elsie Gravely M.D.   On: 11/25/2023 22:21   CT ABDOMEN PELVIS W CONTRAST Result Date: 11/25/2023 CLINICAL DATA:  Pulmonary embolus suspected with high probability. Recent discharge from [facility] for low potassium, pneumonia, and bacterial infection of GI tract. Acute nonlocalized abdominal pain. EXAM: CT ANGIOGRAPHY CHEST CT ABDOMEN AND PELVIS WITH CONTRAST TECHNIQUE: Multidetector CT imaging  of the chest was performed using the standard protocol during bolus administration of intravenous contrast. Multiplanar CT image reconstructions and MIPs were obtained to evaluate the vascular anatomy. Multidetector CT imaging of the abdomen and pelvis was performed using the standard protocol during bolus administration of intravenous contrast. RADIATION DOSE REDUCTION: This exam was performed according to the departmental dose-optimization program which includes automated exposure control, adjustment of the mA and/or kV according to patient size and/or use of iterative reconstruction technique. CONTRAST:  OMNIPAQUE  IOHEXOL  350 MG/ML SOLN COMPARISON:  Chest radiograph 11/25/2023. CT abdomen and pelvis 09/20/2023. CT chest 07/05/2011 FINDINGS: CTA CHEST FINDINGS Cardiovascular: Technically adequate study with good opacification of the central and segmental pulmonary arteries. Moderate motion artifact. No focal filling defects. No evidence of significant pulmonary embolus. Cardiac enlargement. Small pericardial effusion. Normal caliber thoracic aorta. No aortic dissection. Great vessel origins are patent. Calcification of the aorta and coronary arteries. Mediastinum/Nodes: Large esophageal hiatal hernia. Esophagus is decompressed. No significant lymphadenopathy. Thyroid gland is unremarkable. Lungs/Pleura: Trace left pleural  effusion. Atelectasis in the left base. No pneumothorax. Musculoskeletal: Postoperative changes in the cervical spine. Degenerative changes in the thoracic spine. No acute bony abnormalities. Review of the MIP images confirms the above findings. CT ABDOMEN and PELVIS FINDINGS Hepatobiliary: No focal liver abnormality is seen. Status post cholecystectomy. No biliary dilatation. Pancreas: Unremarkable. No pancreatic ductal dilatation or surrounding inflammatory changes. Spleen: Normal in size without focal abnormality. Adrenals/Urinary Tract: Adrenal glands are unremarkable. Kidneys are normal, without renal calculi, focal lesion, or hydronephrosis. Bladder is unremarkable. Stomach/Bowel: Stomach, small bowel, and colon are not abnormally distended. Under distention limits evaluation but there is evidence of wall thickening in the rectosigmoid region which likely indicates colitis. This may represent infectious or inflammatory etiologies. No abscess identified. Appendix is not visualized. Vascular/Lymphatic: Aortic atherosclerosis. No enlarged abdominal or pelvic lymph nodes. Reproductive: Status post hysterectomy. No adnexal masses. Other: No free air or free fluid in the abdomen. Abdominal wall musculature appears intact. Edema in the subcutaneous fat. Musculoskeletal: Lumbar scoliosis convex towards the left. Degenerative changes in the lumbar spine. No acute bony abnormalities. Review of the MIP images confirms the above findings. IMPRESSION: 1. No evidence of significant pulmonary embolus. 2. Large esophageal hiatal hernia. 3. Small left pleural effusion with basilar atelectasis. 4. Aortic atherosclerosis. 5. Wall thickening in the rectosigmoid colon suggesting colitis. No abscess. No proximal obstruction. Electronically Signed   By: Elsie Gravely M.D.   On: 11/25/2023 22:21   DG Chest Port 1 View Result Date: 11/25/2023 EXAM: 1 VIEW(S) XRAY OF THE CHEST 11/25/2023 08:15:00 PM COMPARISON: 07/06/2023  CLINICAL HISTORY: Questionable sepsis - evaluate for abnormality. FINDINGS: LUNGS AND PLEURA: No focal pulmonary opacity. No pulmonary edema. No pleural effusion. No pneumothorax. HEART AND MEDIASTINUM: Normal heart size and mediastinal contours. Atherosclerotic calcifications. BONES AND SOFT TISSUES: Cervical fusion hardware is noted. No acute osseous abnormality. DIAPHRAGM AND UPPER ABDOMEN: A small hiatal hernia is present. IMPRESSION: 1. No acute abnormalities. 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   Gastric ulcer/Gastritis/Large hiatal  hernia: Chronic intractable N/V, epigastric pain, hematemesis, dysphagia in the setting of recently noted severe LA Grade D reflux esophagitis, gastritis, gastric ulcer (NSAID use), large hiatal hernia with prior Nissen fundoplication: -symptoms for over two months associated with 15+ pound weight loss -previous Excedrin  and daily ibuprofen on daily PPI prior to recent EGD, no NSAIDs/ASA in one week. On PPI BID. -Nissen fundoplication in 2011 with GEJ fundoplication herniated on current CT with subsequent large hiatal hernia.    -reports ongoing hematemesis with decline in Hgb. Hgb in 10/2023 was normal at 12.2. At discharge 11/29/23, Hgb 9.8. Today Hgb 7.6.   -reports black watery stools for one week (baseline constipation)  I suspect hematemesis/melena due to UGI bleeding from large gastric ulcer  but severe esophagitis and traumatic bleeding from large hiatal hernia also possible. CTA angio with no active significant bleeding. Would recommend EGD tomorrow once potassium has been corrected.   Her ongoing epigastric pain, N/V, dysphagia could be due her large hiatal hernia, intermittent incarceration of large hiatal hernia, as well as her severe reflux esophagitis and PUD. N/V could be due to opioid induce gastroparesis as well as pain response    Colitis:  -possible rectosigmoid colon wall thickening on CT last admission in setting of some underdistention -remote colonoscopy -chronically constipated entire life, goes two weeks without BM -she has had loose stools for more than a week, stools are black.  - if she is documented to have loose stools this admission, would send for stool testing.   -would advise colonoscopy as outpatient once her N/V  She would not be able to tolerate a bowel prep right now.    Anemia: -Hgb 12.2 (10/21/23)-->8.1 (11/12/23)-->Hgb 8.8 (11/25/23)-->Hgb 7.7 (11/26/23)-->Hgb 9.6 (11/27/23). She has not received blood transfusion. Hgb at discharge 9.8 (11/29/23). Hgb today 7.6.  -Plt 743K, chronically elevated -WBC 17.2, chronically elevated since 09/2023 -folate 14.1 -B12 245 -iron 20, TIBC 230, fe sat 9, ferritin 70 -BUN <5, Cre 0.59  -recommend EGD inpatient, colonoscopy outpatient -transfuse if Hgb <7  Thrombocytosis/Leukocytosis -since Aug 2025 -consider hematology evaluation if no other explanation determined  Abnormal imaging  Small but increased left pleural effusion and associated increased left lung base atelectasis or consolidation. -management per attending    Plan:   Correct potassium. Trend H/H. Transfuse if Hgb <7. Monitor for overt GI bleeding. EGD in AM.  Supportive care for pain/nausea. Consider promethazine  alternating with zofran . May benefit from trial of reglan .  PPI BID. Carafate 1 g suspension qac/at bedtime Consider hematology  evaluation if no explanation for thrombocytosis/leukocytosis noted since 09/2023.      LOS: 0 days   We would like to thank you for the opportunity to participate in the care of WILHELMINE KROGSTAD.  Sonny RAMAN. Ezzard RIGGERS [facility] [phone] 10/16/202511:00 AM

## 2023-12-04 NOTE — Assessment & Plan Note (Signed)
-   Due to hyperemesis -Serum potassium 2.4, magnesium  1 7 -Replacing both potassium and magnesium  IV

## 2023-12-04 NOTE — Assessment & Plan Note (Signed)
 Continue Protonix  40 mg IV twice daily -Carafate p.o. 3 times daily if tolerated

## 2023-12-04 NOTE — ED Notes (Signed)
 Pt states her chest is hurting. EKG already obtained and MD [facility] aware.

## 2023-12-04 NOTE — Discharge Instructions (Signed)

## 2023-12-04 NOTE — ED Notes (Signed)
 Patient transported to CT

## 2023-12-04 NOTE — ED Provider Notes (Signed)
 [place] EMERGENCY DEPARTMENT AT [facility] Provider Note   CSN: [id number] Arrival date & time: 12/04/23  9271     Patient presents with: Abdominal Pain   Renee Gutierrez is a 63 y.o. female.   63 year old female with a history of peptic ulcer disease, Nissen fundoplication, trigeminal neuralgia, and fibromyalgia who presents to the emergency department with abdominal pain and nausea and vomiting.  Admitted and discharged approximately 1 week ago for colitis.  Was given antibiotics.  Also had an EGD that showed a nonbleeding peptic ulcer that was biopsied.  Discharged home and reports that she is having nausea and vomiting this become worse in the past 3 days.  Over the past 24 hours has noticed blood in her vomit.  Not on any blood thinners.  Says that she is having severe epigastric abdominal pain.  Has been seen twice in the emergency department since then but reports that the pain has persisted       Prior to Admission medications   Medication Sig Start Date End Date Taking? Authorizing Provider  amLODipine  (NORVASC ) 5 MG tablet Take 5 mg by mouth daily. 11/24/23  Yes [provider]  diclofenac Sodium (VOLTAREN) 1 % GEL Apply 2 g topically 4 (four) times daily. 04/18/20  Yes [provider]  DULoxetine  (CYMBALTA ) 60 MG capsule Take 60 mg by mouth 2 (two) times daily. 05/16/20  Yes [provider]  erythromycin  ophthalmic ointment Place 1 Application into the left eye 3 (three) times daily.   Yes [provider]  HYDROmorphone  (DILAUDID ) 4 MG tablet Take 4 mg by mouth every 4 (four) hours as needed for severe pain (pain score 7-10).   Yes [provider]  labetalol  (NORMODYNE ) 200 MG tablet Take 200 mg by mouth 2 (two) times daily.   Yes [provider]  lisinopril  (PRINIVIL ,ZESTRIL ) 40 MG tablet Take 40 mg by mouth daily. 01/31/14 12/04/23 Yes [provider]  ondansetron  (ZOFRAN -ODT) 8 MG disintegrating  tablet Take 1 tablet (8 mg total) by mouth every 6 (six) hours as needed for nausea or vomiting. 11/29/23  Yes Darci Pore, MD  pantoprazole  (PROTONIX ) 40 MG tablet Take 1 tablet (40 mg total) by mouth 2 (two) times daily. 11/29/23  Yes Darci Pore, MD  potassium chloride  SA (KLOR-CON  M) 20 MEQ tablet Take 2 tablets (40 mEq total) by mouth daily. Patient taking differently: Take 20 mEq by mouth daily. 11/29/23  Yes Darci Pore, MD  prednisoLONE  acetate (PRED FORTE ) 1 % ophthalmic suspension Place 1 drop into the left eye in the morning and at bedtime. 03/01/20  Yes [provider]  rOPINIRole  (REQUIP ) 2 MG tablet Take 4 mg by mouth at bedtime. 11/11/14  Yes [provider]  simethicone (GAS RELIEF EXTRA STRENGTH) 125 MG chewable tablet Chew 125 mg by mouth daily as needed for flatulence.  05/24/14  Yes [provider]  HYDROcodone -acetaminophen  (NORCO/VICODIN) 5-325 MG tablet Take 2 tablets by mouth every 8 (eight) hours as needed for up to 3 days. Patient not taking: Reported on 12/04/2023 12/01/23 12/04/23  Melvenia Motto, MD  sucralfate (CARAFATE) 1 GM/10ML suspension Take 10 mLs (1 g total) by mouth every 6 (six) hours. Patient not taking: Reported on 12/04/2023 11/29/23   Cleotilde Rogue, MD    Allergies: Buprenorphine hcl, Bupropion, Codeine, Sulfamethoxazole-trimethoprim, Nsaids, Pregabalin , Sulfa antibiotics, Tapentadol, Clindamycin, Clindamycin/lincomycin, Cymbalta  [duloxetine  hcl], Diclofenac, Diclofenac sodium, Gabapentin, Oxycodone , and Toradol  [ketorolac  tromethamine ]    Review of Systems  Updated Vital Signs BP ROLLEN)  173/83   Pulse (!) 109   Temp 99.7 F (37.6 C)   Resp 20   Ht 4' 11 (1.499 m)   Wt 44.5 kg   SpO2 99%   BMI 19.83 kg/m   Physical Exam Vitals and nursing note reviewed.  Constitutional:      General: She is not in acute distress.    Appearance: She is well-developed.  HENT:     Head: Normocephalic and  atraumatic.     Right Ear: External ear normal.     Left Ear: External ear normal.     Nose: Nose normal.  Eyes:     Extraocular Movements: Extraocular movements intact.     Conjunctiva/sclera: Conjunctivae normal.     Pupils: Pupils are equal, round, and reactive to light.  Cardiovascular:     Rate and Rhythm: Regular rhythm. Tachycardia present.  Abdominal:     General: Abdomen is flat. There is no distension.     Palpations: Abdomen is soft. There is no mass.     Tenderness: There is abdominal tenderness (Epigastric). There is no guarding.  Musculoskeletal:     Cervical back: Normal range of motion and neck supple.  Skin:    General: Skin is warm and dry.  Neurological:     Mental Status: She is alert and oriented to person, place, and time. Mental status is at baseline.  Psychiatric:        Mood and Affect: Mood normal.     (all labs ordered are listed, but only abnormal results are displayed) Labs Reviewed  COMPREHENSIVE METABOLIC PANEL WITH GFR - Abnormal; Notable for the following components:      Result Value   Potassium 2.4 (*)    Glucose, Bld 111 (*)    BUN <5 (*)    Calcium  8.8 (*)    Total Protein 5.7 (*)    Albumin 3.1 (*)    AST 12 (*)    All other components within normal limits  CBC WITH DIFFERENTIAL/PLATELET - Abnormal; Notable for the following components:   WBC 17.1 (*)    RBC 2.80 (*)    Hemoglobin 7.6 (*)    HCT 24.1 (*)    Platelets 743 (*)    Neutro Abs 13.5 (*)    Monocytes Absolute 1.4 (*)    Abs Immature Granulocytes 0.21 (*)    All other components within normal limits  PHOSPHORUS - Abnormal; Notable for the following components:   Phosphorus 2.3 (*)    All other components within normal limits  HEMOGLOBIN AND HEMATOCRIT, BLOOD - Abnormal; Notable for the following components:   Hemoglobin 8.1 (*)    HCT 26.2 (*)    All other components within normal limits  EXPECTORATED SPUTUM ASSESSMENT W GRAM STAIN, RFLX TO RESP C  PROTIME-INR   LIPASE, BLOOD  MAGNESIUM   HEMOGLOBIN AND HEMATOCRIT, BLOOD  HEMOGLOBIN AND HEMATOCRIT, BLOOD  BASIC METABOLIC PANEL WITH GFR  TYPE AND SCREEN  ABO/RH    EKG: EKG Interpretation Date/Time:  Thursday December 04 2023 07:53:22 EDT Ventricular Rate:  104 PR Interval:  157 QRS Duration:  83 QT Interval:  350 QTC Calculation: 461 R Axis:   57  Text Interpretation: Sinus tachycardia Probable left atrial enlargement Confirmed by Yolande Charleston [phone]) on 12/04/2023 8:31:10 AM  Radiology: CT ANGIO GI BLEED Result Date: 12/04/2023 EXAM: CTA ABDOMEN AND PELVIS WITH CONTRAST 12/04/2023 09:59:01 AM TECHNIQUE: CTA images of the abdomen and pelvis with intravenous contrast. Three-dimensional MIP/volume rendered formations were performed. Automated  exposure control, iterative reconstruction, and/or weight based adjustment of the mA/kV was utilized to reduce the radiation dose to as low as reasonably achievable. COMPARISON: CTA chest and CT abdomen and pelvis 11/25/2023. CLINICAL HISTORY: 63 year old female with abdominal pain, hematemesis, and suspected bleeding ulcers. FINDINGS: VASCULATURE: Stable aortoiliac atherosclerosis. Major arterial structures are patent and stable. Portal venous system appears patent on the delayed images. GI BLEED: No contrast extravasation identified into the stomach or duodenum, or elswhere. ABDOMEN/PELVIS: LOWER CHEST: Small but increased layering left pleural effusion with simple fluid density. Associated increased left lung base atelectasis or consolidation. No pericardial effusion. Right lung base more stable with curvilinear scarring or atelectasis. LIVER: The liver is unremarkable. GALLBLADDER AND BILE DUCTS: Chronic cholecystectomy. Chronic post cholecystectomy CBD enlargement is stable. SPLEEN: The spleen is unremarkable. PANCREAS: The pancreas is unremarkable. ADRENAL GLANDS: Bilateral adrenal glands demonstrate no acute abnormality. KIDNEYS, URETERS AND BLADDER:  Symmetric renal enhancement and early contrast excretion. No stones in the kidneys or ureters. No hydronephrosis. No perinephric or periureteral stranding. Urinary bladder is unremarkable. GI AND BOWEL: Large gastrocaudal hernia redemonstrated. Evidence of chronic fundoplication and herniated appearance of the fundoplication. No definite acute gastroduodenal inflammation. Non dilated large and small bowel loops. There is no bowel obstruction. No abnormal bowel wall thickening or distension. REPRODUCTIVE: Reproductive organs are unremarkable. PERITONEUM AND RETROPERITONEUM: No pneumoperitoneum. No free fluid identified. LYMPH NODES: No lymphadenopathy. BONES AND SOFT TISSUES: Chronic spine degeneration in the setting of advanced levoconvex scoliosis. No acute abnormality of the bones. No acute soft tissue abnormality. IMPRESSION: 1. 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 No active GI bleeding by CTA. No acute bowel inflammation identified. 2. Small but increased left pleural effusion and associated increased left lung base atelectasis or consolidation. Electronically signed by: Helayne Hurst MD 12/04/2023 10:11 AM EDT RP Workstation: [id number]     Procedures   Medications Ordered in the ED  pantoprazole  (PROTONIX ) injection 40 mg (40 mg Intravenous Given 12/04/23 0818)    Followed by  pantoprazole  (PROTONIX ) injection 40 mg (has no administration in time range)  sodium chloride  flush (NS) 0.9 % injection 3 mL (3 mLs Intravenous Not Given 12/04/23 1232)  0.9 %  sodium chloride  infusion ( Intravenous New Bag/Given 12/04/23 1339)  sodium chloride  flush (NS) 0.9 % injection 3 mL (3 mLs Intravenous Not Given 12/04/23 1232)  acetaminophen  (TYLENOL ) tablet 650 mg (has no administration in time range)    Or  acetaminophen  (TYLENOL ) suppository 650 mg (has no administration in time range)  traZODone (DESYREL) tablet 25 mg (has no administration in time range)   senna-docusate (Senokot-S) tablet 1 tablet (has no administration in time range)  bisacodyl (DULCOLAX) EC tablet 5 mg (has no administration in time range)  sodium phosphate  (FLEET) enema 1 enema (has no administration in time range)  ipratropium (ATROVENT) nebulizer solution 0.5 mg (has no administration in time range)  hydrALAZINE  (APRESOLINE ) injection 10 mg (10 mg Intravenous Given 12/04/23 1509)  metoCLOPramide  (REGLAN ) injection 10 mg (10 mg Intravenous Given 12/04/23 1332)  alum & mag hydroxide-simeth (MAALOX/MYLANTA) 200-200-20 MG/5ML suspension 30 mL (30 mLs Oral Given 12/04/23 1333)    And  lidocaine (XYLOCAINE) 2 % viscous mouth solution 15 mL (15 mLs Oral Given 12/04/23 1330)  sucralfate (CARAFATE) 1 GM/10ML suspension 1 g (1 g Oral Given 12/04/23 1645)  prochlorperazine  (COMPAZINE ) injection 10 mg (has no administration in time range)  amLODipine  (NORVASC ) tablet 5 mg (has no administration in time range)  DULoxetine  (CYMBALTA )  DR capsule 60 mg (has no administration in time range)  labetalol  (NORMODYNE ) tablet 200 mg (200 mg Oral Given 12/04/23 1553)  potassium PHOSPHATE 15 mmol in dextrose  5 % 250 mL infusion (15 mmol Intravenous New Bag/Given 12/04/23 1555)  HYDROmorphone  (DILAUDID ) injection 2 mg (2 mg Intravenous Given 12/04/23 1644)  HYDROmorphone  (DILAUDID ) injection 0.5 mg (0.5 mg Intravenous Given 12/04/23 0754)  ondansetron  (ZOFRAN ) injection 4 mg (4 mg Intravenous Given 12/04/23 0754)  lactated ringers  bolus 1,000 mL (0 mLs Intravenous Stopped 12/04/23 1115)  HYDROmorphone  (DILAUDID ) injection 0.5 mg (0.5 mg Intravenous Given 12/04/23 0839)  metoCLOPramide  (REGLAN ) injection 10 mg (10 mg Intravenous Given 12/04/23 0839)  diphenhydrAMINE  (BENADRYL ) injection 12.5 mg (12.5 mg Intravenous Given 12/04/23 0839)  potassium chloride  10 mEq in 100 mL IVPB (10 mEq Intravenous New Bag/Given 12/04/23 1011)  magnesium  sulfate IVPB 2 g 50 mL (2 g Intravenous New Bag/Given 12/04/23  1114)  iohexol  (OMNIPAQUE ) 350 MG/ML injection 75 mL (75 mLs Intravenous Contrast Given 12/04/23 0940)  ketamine  50 mg in normal saline 5 mL (10 mg/mL) syringe (13 mg Intravenous Given 12/04/23 0957)  HYDROmorphone  (DILAUDID ) injection 1 mg (1 mg Intravenous Given 12/04/23 1123)  potassium chloride  10 mEq in 100 mL IVPB (10 mEq Intravenous New Bag/Given 12/04/23 1445)    Clinical Course as of 12/04/23 1648  Thu Dec 04, 2023  1144 Dr Belvia from GI consulted and will consider scoping tomorrow.  [RP]  1211 Dr Hulan from hospitalist consulted for admission. [RP]    Clinical Course User Index [RP] Yolande Lamar BROCKS, MD                                 Medical Decision Making Amount and/or Complexity of Data Reviewed Labs: ordered. Radiology: ordered.  Risk Prescription drug management. Decision regarding hospitalization.   Renee Gutierrez is a 63 year old female with a history of peptic ulcer disease, Nissen fundoplication, trigeminal neuralgia, and fibromyalgia who presents to the emergency department with abdominal pain and nausea and vomiting.    Initial Ddx:  Upper GI bleed, peptic ulcer disease, obstruction  MDM/Course:  Patient presents emergency department with abdominal pain and hematemesis.  On exam he epigastrium.  Also tachycardic request blood pressure.  she underwent a cta that did not show active bleeding.  Did show some chronic herniation of her GE junction and hiatal hernia.  Hemoglobin has dropped slightly from her baseline of 9.5 to 7.6.  Also found to be hypokalemic and this was replenished via IV and orally.  Upon re-evaluation still having significant pain so was given dilaudid  as well as reglan  for nausea.  Admitted to hospitalist further management.  Discussed with GI who will plan on possible repeat endoscopy in the morning  This patient presents to the ED for concern of complaints listed in HPI, this involves an extensive number of treatment options, and  is a complaint that carries with it a high risk of complications and morbidity. Disposition including potential need for admission considered.   Dispo: Admit to Floor  Additional history obtained from spouse Records reviewed Outpatient Clinic Notes The following labs were independently interpreted: CBC and show acute anemia I independently reviewed the following imaging with scope of interpretation limited to determining acute life threatening conditions related to emergency care: CT Abdomen/Pelvis and agree with the radiologist interpretation with the following exceptions: none I personally reviewed and interpreted cardiac monitoring: sinus tachycardia I personally reviewed and interpreted  the pt's EKG: see above for interpretation  I have reviewed the patients home medications and made adjustments as needed Consults: Gastroenterology and Hospitalist  CRITICAL CARE Performed by: Lamar JAYSON Shan   Total critical care time: 30 minutes  Critical care time was exclusive of separately billable procedures and treating other patients.  Critical care was necessary to treat or prevent imminent or life-threatening deterioration.  Critical care was time spent personally by me on the following activities: development of treatment plan with patient and/or surrogate as well as nursing, discussions with consultants, evaluation of patient's response to treatment, examination of patient, obtaining history from patient or surrogate, ordering and performing treatments and interventions, ordering and review of laboratory studies, ordering and review of radiographic studies, pulse oximetry and re-evaluation of patient's condition.   Portions of this note were generated with Scientist, clinical (histocompatibility and immunogenetics). Dictation errors may occur despite best attempts at proofreading.     Final diagnoses:  Upper GI bleed  Abdominal pain, unspecified abdominal location  Nausea and vomiting, unspecified vomiting type   Hypokalemia    ED Discharge Orders     None          Shan Lamar JAYSON, MD 12/04/23 [id number]

## 2023-12-04 NOTE — Assessment & Plan Note (Signed)
-   Currently stable, continue as needed Xanax as needed IV Ativan or Haldol

## 2023-12-04 NOTE — Assessment & Plan Note (Signed)
-   Trend H&H closely, her hemoglobin dropped from 9-7.6 - If continue to be symptomatic anticipating transfusing 1U PRBC - Repeating H&H    Latest Ref Rng & Units 12/04/2023    8:05 AM 12/01/2023    2:13 PM 11/29/2023    4:07 AM  CBC  WBC 4.0 - 10.5 K/uL 17.1  19.5  12.9   Hemoglobin 12.0 - 15.0 g/dL 7.6  9.0  9.8   Hematocrit 36.0 - 46.0 % 24.1  30.0  31.3   Platelets 150 - 400 K/uL 743  864  865

## 2023-12-05 ENCOUNTER — Telehealth: Payer: Self-pay | Admitting: Gastroenterology

## 2023-12-05 ENCOUNTER — Observation Stay (HOSPITAL_COMMUNITY): Payer: Self-pay

## 2023-12-05 ENCOUNTER — Encounter (HOSPITAL_COMMUNITY): Admission: EM | Disposition: A | Payer: Self-pay | Source: Home / Self Care | Attending: Family Medicine

## 2023-12-05 DIAGNOSIS — G43909 Migraine, unspecified, not intractable, without status migrainosus: Secondary | ICD-10-CM | POA: Diagnosis present

## 2023-12-05 DIAGNOSIS — R7303 Prediabetes: Secondary | ICD-10-CM | POA: Diagnosis present

## 2023-12-05 DIAGNOSIS — Z79899 Other long term (current) drug therapy: Secondary | ICD-10-CM | POA: Diagnosis not present

## 2023-12-05 DIAGNOSIS — F17210 Nicotine dependence, cigarettes, uncomplicated: Secondary | ICD-10-CM

## 2023-12-05 DIAGNOSIS — Z9889 Other specified postprocedural states: Secondary | ICD-10-CM

## 2023-12-05 DIAGNOSIS — R112 Nausea with vomiting, unspecified: Secondary | ICD-10-CM | POA: Diagnosis not present

## 2023-12-05 DIAGNOSIS — K581 Irritable bowel syndrome with constipation: Secondary | ICD-10-CM | POA: Diagnosis present

## 2023-12-05 DIAGNOSIS — Z886 Allergy status to analgesic agent status: Secondary | ICD-10-CM | POA: Diagnosis not present

## 2023-12-05 DIAGNOSIS — M797 Fibromyalgia: Secondary | ICD-10-CM | POA: Diagnosis present

## 2023-12-05 DIAGNOSIS — Z23 Encounter for immunization: Secondary | ICD-10-CM | POA: Diagnosis present

## 2023-12-05 DIAGNOSIS — K254 Chronic or unspecified gastric ulcer with hemorrhage: Secondary | ICD-10-CM | POA: Diagnosis not present

## 2023-12-05 DIAGNOSIS — K259 Gastric ulcer, unspecified as acute or chronic, without hemorrhage or perforation: Secondary | ICD-10-CM

## 2023-12-05 DIAGNOSIS — F411 Generalized anxiety disorder: Secondary | ICD-10-CM | POA: Diagnosis present

## 2023-12-05 DIAGNOSIS — I10 Essential (primary) hypertension: Secondary | ICD-10-CM

## 2023-12-05 DIAGNOSIS — F1120 Opioid dependence, uncomplicated: Secondary | ICD-10-CM | POA: Diagnosis present

## 2023-12-05 DIAGNOSIS — G2581 Restless legs syndrome: Secondary | ICD-10-CM | POA: Diagnosis present

## 2023-12-05 DIAGNOSIS — M1990 Unspecified osteoarthritis, unspecified site: Secondary | ICD-10-CM | POA: Diagnosis present

## 2023-12-05 DIAGNOSIS — Z885 Allergy status to narcotic agent status: Secondary | ICD-10-CM | POA: Diagnosis not present

## 2023-12-05 DIAGNOSIS — K250 Acute gastric ulcer with hemorrhage: Secondary | ICD-10-CM

## 2023-12-05 DIAGNOSIS — D62 Acute posthemorrhagic anemia: Secondary | ICD-10-CM | POA: Diagnosis present

## 2023-12-05 DIAGNOSIS — K920 Hematemesis: Secondary | ICD-10-CM

## 2023-12-05 DIAGNOSIS — R1013 Epigastric pain: Secondary | ICD-10-CM | POA: Diagnosis present

## 2023-12-05 DIAGNOSIS — J189 Pneumonia, unspecified organism: Secondary | ICD-10-CM | POA: Diagnosis not present

## 2023-12-05 DIAGNOSIS — I129 Hypertensive chronic kidney disease with stage 1 through stage 4 chronic kidney disease, or unspecified chronic kidney disease: Secondary | ICD-10-CM | POA: Diagnosis present

## 2023-12-05 DIAGNOSIS — Z881 Allergy status to other antibiotic agents status: Secondary | ICD-10-CM | POA: Diagnosis not present

## 2023-12-05 DIAGNOSIS — E876 Hypokalemia: Secondary | ICD-10-CM | POA: Diagnosis present

## 2023-12-05 DIAGNOSIS — K922 Gastrointestinal hemorrhage, unspecified: Secondary | ICD-10-CM | POA: Diagnosis not present

## 2023-12-05 DIAGNOSIS — T39395A Adverse effect of other nonsteroidal anti-inflammatory drugs [NSAID], initial encounter: Secondary | ICD-10-CM | POA: Diagnosis present

## 2023-12-05 DIAGNOSIS — E785 Hyperlipidemia, unspecified: Secondary | ICD-10-CM | POA: Diagnosis present

## 2023-12-05 DIAGNOSIS — Z9049 Acquired absence of other specified parts of digestive tract: Secondary | ICD-10-CM | POA: Diagnosis not present

## 2023-12-05 DIAGNOSIS — G894 Chronic pain syndrome: Secondary | ICD-10-CM | POA: Diagnosis present

## 2023-12-05 DIAGNOSIS — K449 Diaphragmatic hernia without obstruction or gangrene: Secondary | ICD-10-CM | POA: Diagnosis present

## 2023-12-05 DIAGNOSIS — R109 Unspecified abdominal pain: Secondary | ICD-10-CM | POA: Diagnosis not present

## 2023-12-05 HISTORY — PX: ESOPHAGOGASTRODUODENOSCOPY: SHX5428

## 2023-12-05 LAB — HEMOGLOBIN AND HEMATOCRIT, BLOOD
HCT: 23.8 % — ABNORMAL LOW (ref 36.0–46.0)
HCT: 25.3 % — ABNORMAL LOW (ref 36.0–46.0)
HCT: 33.5 % — ABNORMAL LOW (ref 36.0–46.0)
Hemoglobin: 7.3 g/dL — ABNORMAL LOW (ref 12.0–15.0)
Hemoglobin: 7.6 g/dL — ABNORMAL LOW (ref 12.0–15.0)
Hemoglobin: 11 g/dL — ABNORMAL LOW (ref 12.0–15.0)

## 2023-12-05 LAB — BASIC METABOLIC PANEL WITH GFR
Anion gap: 13 (ref 5–15)
BUN: <5 mg/dL — ABNORMAL LOW (ref 8–23)
CO2: 21 mmol/L — ABNORMAL LOW (ref 22–32)
Calcium: 8.7 mg/dL — ABNORMAL LOW (ref 8.9–10.3)
Chloride: 106 mmol/L (ref 98–111)
Creatinine, Ser: 0.69 mg/dL (ref 0.44–1.00)
GFR, Estimated: >60 mL/min (ref >60)
Glucose, Bld: 124 mg/dL — ABNORMAL HIGH (ref 70–99)
Potassium: 3.2 mmol/L — ABNORMAL LOW (ref 3.5–5.1)
Sodium: 140 mmol/L (ref 135–145)

## 2023-12-05 LAB — GLUCOSE, CAPILLARY: Glucose-Capillary: 129 mg/dL — ABNORMAL HIGH (ref 70–99)

## 2023-12-05 LAB — APTT: aPTT: 39 s — ABNORMAL HIGH (ref 24–36)

## 2023-12-05 LAB — PREPARE RBC (CROSSMATCH)

## 2023-12-05 LAB — PROTIME-INR
INR: 1 (ref 0.8–1.2)
Prothrombin Time: 13.3 s (ref 11.4–15.2)

## 2023-12-05 SURGERY — EGD (ESOPHAGOGASTRODUODENOSCOPY)
Anesthesia: General

## 2023-12-05 MED ORDER — SODIUM CHLORIDE 0.9 % IV SOLN: INTRAVENOUS | Status: DC

## 2023-12-05 MED ORDER — FUROSEMIDE 10 MG/ML IJ SOLN: 20.0000 mg | Freq: Once | INTRAMUSCULAR | Status: DC

## 2023-12-05 MED ORDER — LACTATED RINGERS IV SOLN: INTRAVENOUS | Status: DC | PRN

## 2023-12-05 MED ORDER — SODIUM CHLORIDE 0.9% IV SOLUTION: Freq: Once | INTRAVENOUS | Status: DC

## 2023-12-05 MED ORDER — FUROSEMIDE 10 MG/ML IJ SOLN
20.0000 mg | Freq: Once | INTRAMUSCULAR | Status: DC
Start: 2023-12-05 — End: 2023-12-07

## 2023-12-05 MED ORDER — LIDOCAINE 2% (20 MG/ML) 5 ML SYRINGE
INTRAMUSCULAR | Status: DC | PRN
  Administered 2023-12-05: 60 mg via INTRAVENOUS

## 2023-12-05 MED ORDER — POTASSIUM CHLORIDE 10 MEQ/100ML IV SOLN: 10.0000 meq | INTRAVENOUS | Status: AC

## 2023-12-05 MED ORDER — LACTATED RINGERS IV SOLN: INTRAVENOUS | Status: DC

## 2023-12-05 MED ORDER — PROPOFOL 500 MG/50ML IV EMUL
INTRAVENOUS | Status: DC | PRN
  Administered 2023-12-05: 40 mg via INTRAVENOUS
  Administered 2023-12-05: 100 mg via INTRAVENOUS
  Administered 2023-12-05 (×2): 30 mg via INTRAVENOUS

## 2023-12-05 NOTE — Transfer of Care (Signed)
 Immediate Anesthesia Transfer of Care Note  Patient: Renee Gutierrez  Procedure(s) Performed: EGD (ESOPHAGOGASTRODUODENOSCOPY)  Patient Location: PACU  Anesthesia Type:General  Level of Consciousness: awake  Airway & Oxygen Therapy: Patient Spontanous Breathing  Post-op Assessment: Report given to RN and Post -op Vital signs reviewed and stable  Post vital signs: Reviewed and stable  Last Vitals:  Vitals Value Taken Time  BP 149/67   Temp 98.5   Pulse 117   Resp 14   SpO2 100%     Last Pain:  Vitals:   12/05/23 1156  TempSrc:   PainSc: 7       Patients Stated Pain Goal: 7 (12/05/23 1037)  Complications: No notable events documented.

## 2023-12-05 NOTE — Anesthesia Preprocedure Evaluation (Addendum)
 Anesthesia Evaluation  Patient identified by MRN, date of birth, ID band Patient awake    Reviewed: Allergy & Precautions, H&P , NPO status , Patient's Chart, lab work & pertinent test results, reviewed documented beta blocker date and time   Airway Mallampati: I  TM Distance: >3 FB Neck ROM: Full    Dental no notable dental hx.    Pulmonary asthma , Current Smoker and Patient abstained from smoking.   Pulmonary exam normal breath sounds clear to auscultation       Cardiovascular Exercise Tolerance: Good hypertension,  Rhythm:regular Rate:Normal     Neuro/Psych  Headaches PSYCHIATRIC DISORDERS Anxiety Depression     Neuromuscular disease    GI/Hepatic Neg liver ROS, hiatal hernia, PUD,GERD  ,,  Endo/Other  negative endocrine ROS    Renal/GU Renal disease  negative genitourinary   Musculoskeletal   Abdominal   Peds  Hematology  (+) Blood dyscrasia, anemia   Anesthesia Other Findings   Reproductive/Obstetrics negative OB ROS                              Anesthesia Physical Anesthesia Plan  ASA: 3 and emergent  Anesthesia Plan: General   Post-op Pain Management:    Induction:   PONV Risk Score and Plan: Propofol infusion  Airway Management Planned:   Additional Equipment:   Intra-op Plan:   Post-operative Plan:   Informed Consent: I have reviewed the patients History and Physical, chart, labs and discussed the procedure including the risks, benefits and alternatives for the proposed anesthesia with the patient or authorized representative who has indicated his/her understanding and acceptance.     Dental Advisory Given  Plan Discussed with: CRNA  Anesthesia Plan Comments:          Anesthesia Quick Evaluation

## 2023-12-05 NOTE — Progress Notes (Addendum)
 PROGRESS NOTE    Patient: Renee Gutierrez                            PCP: Patient, No Pcp Per                    DOB: Feb 10, 1961            DOA: 12/04/2023 FMW:[id number]             DOS: 12/05/2023, 11:20 AM   LOS: 0 days   Date of Service: The patient was seen and examined on 12/05/2023  Subjective:   The patient was seen and examined this morning. Hemodynamically stable.  Still complaining abdominal pain, tolerable with IV pain meds Improved nausea vomiting N.p.o. Hemoglobin dropped to 7.3   Brief Narrative:   Levorn BROCKS Pilch is a 63-year-old female with a history of multiple comorbidities including PUD, GERD, IBS, HTN, CKD, fibromyalgia, migraines, restless leg syndrome.. With recent admission and discharged on 11/29/2023 for intractable nausea vomiting, gastric ulcer-performing EGD on 11/28/2023... Scented back again with nausea vomiting, hematemesis.   Reports that her symptoms of abdominal pain nausea vomiting started 3 days ago and progressively getting worse.  In past 24 hours having bloody vomitus.  Has not taken any NSAIDs and not on any blood thinners.   ED evaluation: Blood pressure (!) 174/105, pulse (!) 110, temperature 98.4 F (36.9 C), temperature source Oral, resp. rate 19, height 4' 11 (1.499 m), weight 44.5 kg, SpO2 97%. LABs: Potassium 2.4, magnesium  1.7, WBC 17.1, hemoglobin 7.6  CT angio GI bleed: IMPRESSION: 1. 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 No active GI bleeding by CTA. No acute bowel inflammation identified. 2. Small but increased left pleural effusion and associated increased left lung base atelectasis or consolidation.  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 Ulcer described below was located in the herniated sac. - Non-bleeding gastric ulcer with a nonbleeding visible vessel (Forrest Class IIa). Treated with bipolar cautery.  - Normal duodenum, stomach. Biopsied.   RECOMMENDATIONS  - Full liquid diet today. Advance diet tomorrow. - Complete 72 hours of IV pantoprazole . - Compazine  10 mg as needed for nausea/vomiting.   - Hemoglobin drop from 9-7.3-due to anticipated active GI bleed After discussion with patient, pursuing with 2U PRBC blood transfusion  Nausea & vomiting -Improved Intractable nausea vomiting, with hematemesis - Patient received multiple antiemetic IV medication in ED -Will resume and schedule Reglan  10 mg IV 3 times daily, as needed Zofran /Phenergan  - N.p.o.   Acute gastric ulcer with hemorrhage Recent hospitalization and EGD on 11/28/2023, revealing large ulcer in the first portion of the stomach distal to esophagus was thought likely  due to NSAID use patient was discharged on PPI twice daily, Carafate 4 times a day  -N.p.o. -IV fluids -Continue with IV Protonix , Carafate -Monitoring H&H closely - EGD today  Acute post-hemorrhagic anemia - Trend H&H closely, her hemoglobin dropped from 9->>> 7.3 - Transfusing 2U PRBC 12/05/2023 - Repeating H&H:   Hyperlipidemia Continue statins when tolerating p.o.  GERD (gastroesophageal reflux disease) Continue Protonix  40 mg IV twice daily -Carafate p.o. 3  times daily if tolerated   Hypokalemia - Due to hyperemesis -Serum potassium 2.4, magnesium  1. 7 -Replacing both potassium and magnesium  IV   Essential hypertension - Currently stable, anemic anticipating hypotension -Reviewing home medication holding BP meds, as needed hydralazine   Esophageal hiatal hernia - Chronic, present on imaging -Monitor closely may be exacerbating GERD, Continue Protonix   GAD (generalized anxiety disorder) - Currently stable, continue as needed Xanax as needed IV Ativan or Haldol  Prediabetes Monitoring CBC closely -Last A1c 5.9  Opioid dependent, chronic pain  - Continue with IV analgesics, once tolerating p.o. resuming home regimen  Presumed pneumonia  - Recent history per family/patient unable to take or complete PO  antibiotics Due to leukocytosis-will continue empiric IV antibiotic Rocephin  kidney disease, chronic, stage II (mild, 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) BUN/creatinine at baseline, monitoring closely    ----------------------------------------------------------------------------------------------------------------------------------------------- Nutritional status:  The patient's BMI is: Body mass index is 20.48 kg/m. I agree with the assessment and plan as outlined   ------------------------------------------------------------------------------------------------------------------------------------------------  DVT prophylaxis:  Place and maintain sequential compression device Start: 12/04/23 1220 SCDs Start: 12/04/23 1213   Code Status:   Code Status: Full Code  Family Communication: No family member present at bedside-  -Advance care planning has been discussed.   Admission status:   Status is: Observation The patient remains OBS appropriate and will d/c before 2 midnights.   Disposition: From  - home             Planning for discharge in 1-2 days   Procedures:   No admission procedures for hospital  encounter.   Antimicrobials:  Anti-infectives (From admission, onward)    Start     Dose/Rate Route Frequency Ordered Stop   12/04/23 2000  [MAR Hold]  cefTRIAXone (ROCEPHIN) 1 g in sodium chloride  0.9 % 100 mL IVPB        (MAR Hold since Fri 12/05/2023 at 1032.Hold Reason: Transfer to a Procedural area)   1 g 200 mL/hr over 30 Minutes Intravenous Every 24 hours 12/04/23 1801          Medication:   [MAR Hold] sodium chloride    Intravenous Once   [MAR Hold] amLODipine   5 mg Oral Daily   [MAR Hold] DULoxetine   60 mg Oral BID   [MAR Hold] furosemide  20 mg Intravenous Once   [MAR Hold] furosemide  20 mg Intravenous Once   influenza vac split trivalent PF  0.5 mL Intramuscular Tomorrow-1000   [MAR Hold] labetalol   200 mg Oral BID   [MAR Hold] metoCLOPramide  (REGLAN ) injection  10 mg Intravenous Q8H   [MAR Hold] pantoprazole  (PROTONIX ) IV  40 mg Intravenous Q12H   [MAR Hold] pneumococcal 20-valent conjugate vaccine  0.5 mL Intramuscular Tomorrow-1000   [MAR Hold] sodium chloride  flush  3 mL Intravenous Q12H   [MAR Hold] sodium chloride  flush  3 mL Intravenous Q12H   [MAR Hold] sucralfate  1 g Oral TID WC & HS    [MAR Hold] acetaminophen  **OR** [MAR Hold] acetaminophen , [MAR Hold] alum & mag hydroxide-simeth **AND** [COMPLETED] lidocaine, [MAR  Hold] bisacodyl, [MAR Hold] hydrALAZINE , [MAR Hold]  HYDROmorphone  (DILAUDID ) injection, [MAR Hold] ipratropium, [MAR Hold] prochlorperazine , [MAR Hold] senna-docusate, [MAR Hold] sodium phosphate , [MAR Hold] traZODone   Objective:   Vitals:   12/04/23 1617 12/04/23 2037 12/05/23 0503 12/05/23 1037  BP: (!) 173/83 (!) 148/67 103/64 (!) 178/92  Pulse:  (!) 110 90 99  Resp: 20 18 16 15   Temp: 99.7 F (37.6 C) 97.9 F (36.6 C) 99.1 F (37.3 C) 98.9 F (37.2 C)  TempSrc:   Oral Oral  SpO2: 99% 94% 100% 100%  Weight:   46 kg 46 kg  Height:    4' 11 (1.499 m)    Intake/Output Summary (Last 24 hours) at 12/05/2023 1120 Last data filed  at 12/05/2023 0546 Gross per 24 hour  Intake 1427.67 ml  Output --  Net 1427.67 ml   Filed Weights   12/04/23 0737 12/05/23 0503 12/05/23 1037  Weight: 44.5 kg 46 kg 46 kg     Physical examination:   General:  AAO x 3,  cooperative, no distress;   HEENT:  Normocephalic, PERRL, otherwise with in Normal limits   Neuro:  CNII-XII intact. , normal motor and sensation, reflexes intact   Lungs:   Clear to auscultation BL, Respirations unlabored,  No wheezes / crackles  Cardio:    S1/S2, RRR, No murmure, No Rubs or Gallops   Abdomen:  Soft, non-tender, bowel sounds active all four quadrants, no guarding or peritoneal signs.  Muscular  skeletal:  Limited exam -global generalized weaknesses - in bed, able to move all 4 extremities,   2+ pulses,  symmetric, No pitting edema  Skin:  Dry, warm to touch, negative for any Rashes,  Wounds: Please see nursing documentation       ------------------------------------------------------------------------------------------------------------------------------------------    LABs:     Latest Ref Rng & Units 12/05/2023    9:03 AM 12/05/2023    3:09 AM 12/04/2023    9:14 PM  CBC  Hemoglobin 12.0 - 15.0 g/dL 7.6  7.3  7.2   Hematocrit 36.0 - 46.0 % 25.3  23.8  23.1       Latest Ref Rng & Units 12/05/2023    3:09 AM 12/04/2023    3:34 PM 12/04/2023    8:05 AM  CMP  Glucose 70 - 99 mg/dL 875  86  888   BUN 8 - 23 mg/dL <5  <5  <5   Creatinine 0.44 - 1.00 mg/dL 9.30  9.43  9.40   Sodium 135 - 145 mmol/L 140  140  142   Potassium 3.5 - 5.1 mmol/L 3.2  2.7  2.4   Chloride 98 - 111 mmol/L 106  105  107   CO2 22 - 32 mmol/L 21  19  22    Calcium  8.9 - 10.3 mg/dL 8.7  8.8  8.8   Total Protein 6.5 - 8.1 g/dL   5.7   Total Bilirubin 0.0 - 1.2 mg/dL   <9.7   Alkaline Phos 38 - 126 U/L   64   AST 15 - 41 U/L   12   ALT 0 - 44 U/L   7        Micro Results Recent Results (from the past 240 hours)  Resp panel by RT-PCR (RSV, Flu A&B,  Covid) Anterior Nasal Swab     Status: None   Collection Time: 11/25/23  8:07 PM   Specimen: Anterior Nasal Swab  Result Value Ref Range Status   SARS Coronavirus 2 by  RT PCR NEGATIVE NEGATIVE Final    Comment: (NOTE) SARS-CoV-2 target nucleic acids are NOT DETECTED.  The SARS-CoV-2 RNA is generally detectable in upper respiratory specimens during the acute phase of infection. The lowest concentration of SARS-CoV-2 viral copies this assay can detect is 138 copies/mL. A negative result does not preclude SARS-Cov-2 infection and should not be used as the sole basis for treatment or other patient management decisions. A negative result may occur with  improper specimen collection/handling, submission of specimen other than nasopharyngeal swab, presence of viral mutation(s) within the areas targeted by this assay, and inadequate number of viral copies(<138 copies/mL). A negative result must be combined with clinical observations, patient history, and epidemiological information. The expected result is Negative.  Fact Sheet for Patients:  BloggerCourse.com  Fact Sheet for Healthcare Providers:  SeriousBroker.it  This test is no t yet approved or cleared by the [place]  FDA and  has been authorized for detection and/or diagnosis of SARS-CoV-2 by FDA under an Emergency Use Authorization (EUA). This EUA will remain  in effect (meaning this test can be used) for the duration of the COVID-19 declaration under Section 564(b)(1) of the Act, 21 U.S.C.section 360bbb-3(b)(1), unless the authorization is terminated  or revoked sooner.       Influenza A by PCR NEGATIVE NEGATIVE Final   Influenza B by PCR NEGATIVE NEGATIVE Final    Comment: (NOTE) The Xpert Xpress SARS-CoV-2/FLU/RSV plus assay is intended as an aid in the diagnosis of influenza from Nasopharyngeal swab specimens and should not be used as a sole basis for treatment. Nasal  washings and aspirates are unacceptable for Xpert Xpress SARS-CoV-2/FLU/RSV testing.  Fact Sheet for Patients: BloggerCourse.com  Fact Sheet for Healthcare Providers: SeriousBroker.it  This test is not yet approved or cleared by the [place]  FDA and has been authorized for detection and/or diagnosis of SARS-CoV-2 by FDA under an Emergency Use Authorization (EUA). This EUA will remain in effect (meaning this test can be used) for the duration of the COVID-19 declaration under Section 564(b)(1) of the Act, 21 U.S.C. section 360bbb-3(b)(1), unless the authorization is terminated or revoked.     Resp Syncytial Virus by PCR NEGATIVE NEGATIVE Final    Comment: (NOTE) Fact Sheet for Patients: BloggerCourse.com  Fact Sheet for Healthcare Providers: SeriousBroker.it  This test is not yet approved or cleared by the [place]  FDA and has been authorized for detection and/or diagnosis of SARS-CoV-2 by FDA under an Emergency Use Authorization (EUA). This EUA will remain in effect (meaning this test can be used) for the duration of the COVID-19 declaration under Section 564(b)(1) of the Act, 21 U.S.C. section 360bbb-3(b)(1), unless the authorization is terminated or revoked.  Performed at [facility], [address], [place], [place] [address]   Blood Culture (routine x 2)     Status: None   Collection Time: 11/25/23  8:08 PM   Specimen: BLOOD  Result Value Ref Range Status   Specimen Description BLOOD LEFT ANTECUBITAL  Final   Special Requests   Final    BOTTLES DRAWN AEROBIC AND ANAEROBIC Blood Culture adequate volume   Culture   Final    NO GROWTH 5 DAYS Performed at [facility], [address], [place], [place] [address]    Report Status 11/30/2023 FINAL  Final  Blood Culture (routine x 2)     Status: None   Collection Time: 11/25/23  9:34 PM   Specimen: BLOOD  Result  Value Ref Range Status   Specimen Description  BLOOD RIGHT ANTECUBITAL  Final   Special Requests   Final    BOTTLES DRAWN AEROBIC AND ANAEROBIC Blood Culture adequate volume   Culture   Final    NO GROWTH 5 DAYS Performed at [facility], [address] [place], [place] [address]    Report Status 11/30/2023 FINAL  Final    Radiology Reports No results found.  SIGNED: Adriana DELENA Grams, MD, FHM. FAAFP. Jolynn Pack - [facility] Time spent - 55 min.  In seeing, evaluating and examining the patient. Reviewing medical records, labs, drawn plan of care. [facility],  Pager (please use [email or web address] to page/ text) Please use Epic Secure Chat for non-urgent communication (7AM-7PM)  If 7PM-7AM, please contact night-coverage [email or web address], 12/05/2023, 11:20 AM

## 2023-12-05 NOTE — Progress Notes (Signed)

## 2023-12-05 NOTE — Plan of Care (Signed)

## 2023-12-05 NOTE — Plan of Care (Signed)
   Problem: Education: Goal: Knowledge of General Education information will improve Description: Including pain rating scale, medication(s)/side effects and non-pharmacologic comfort measures Outcome: Progressing   Problem: Clinical Measurements: Goal: Ability to maintain clinical measurements within normal limits will improve Outcome: Progressing Goal: Diagnostic test results will improve Outcome: Progressing

## 2023-12-05 NOTE — Care Management Obs Status (Signed)
 MEDICARE OBSERVATION STATUS NOTIFICATION   Patient Details  Name: Renee Gutierrez MRN: [id number] Date of Birth: 14-Nov-1960   Medicare Observation Status Notification Given:  Yes    Kieron Kantner L Elis Sauber 12/05/2023, 4:52 PM

## 2023-12-05 NOTE — Brief Op Note (Addendum)
 12/05/2023  12:23 PM  PATIENT:  Renee Gutierrez  63 y.o. female  PRE-OPERATIVE DIAGNOSIS:  hematemesis, decline in hemoglobin, history of recent treated ulcer  POST-OPERATIVE DIAGNOSIS:  nissin fundapliaction, gastric ulcer at atrum, large gastric body scar, hiatal hernia  PROCEDURE:  Procedure(s): EGD (ESOPHAGOGASTRODUODENOSCOPY) (N/A)  SURGEON:  Surgeons and Role:    * Eartha Angelia Sieving, MD - Primary  Patient underwent EGD under propofol sedation.  Tolerated the procedure adequately.   FINDINGS: - 3 cm paraesophageal hernia. Ulcer described below was located in the herniated sac. - Non-bleeding gastric ulcer with a nonbleeding visible vessel (Forrest Class IIa).  Treated with bipolar cautery.  - Normal stomach.  Biopsied.  - Normal examined duodenum.   RECOMMENDATIONS - Return patient to hospital ward for ongoing care.  - Full liquid diet today. Advance diet tomorrow. - Complete 72 hours of IV pantoprazole . - Continue Carafate slurry 1 g every 6 hours for 2 months. - Compazine  10 mg as needed for nausea/vomiting. - Daily EKGs. Monitor QTc. - Referral for hiatal hernia repair at [facility].  Sieving Eartha, MD Gastroenterology and Hepatology [facility] Gastroenterology

## 2023-12-05 NOTE — Op Note (Addendum)
 [facility] Patient Name: Renee Gutierrez Procedure Date: 12/05/2023 11:33 AM MRN: [id number] Date of Birth: 1960/10/01 Attending MD: Toribio Fortune , , [id number] CSN: [id number] Age: 63 Admit Type: Outpatient Procedure:                Upper GI endoscopy Indications:              Abdominal pain, Hematemesis, Acute peptic ulcer Providers:                Toribio Fortune, Rosina Sprague, Madelin Hunter, RN Referring MD:              Medicines:                Monitored Anesthesia Care Complications:            No immediate complications. Estimated Blood Loss:     Estimated blood loss: none. Procedure:                Pre-Anesthesia Assessment:                           - Prior to the procedure, a History and Physical                            was performed, and patient medications, allergies                            and sensitivities were reviewed. The patient's                            tolerance of previous anesthesia was reviewed.                           - The risks and benefits of the procedure and the                            sedation options and risks were discussed with the                            patient. All questions were answered and informed                            consent was obtained.                           - ASA Grade Assessment: III - A patient with severe                            systemic disease.                           After obtaining informed consent, the endoscope was                            passed under direct vision. Throughout the                            procedure,  the patient's blood pressure, pulse, and                            oxygen saturations were monitored continuously. The                            [id number] ([id number]) Upper was introduced through                            the mouth, and advanced to the second part of                            duodenum. The upper GI endoscopy was accomplished                             without difficulty. The patient tolerated the                            procedure well. Scope In: 12:02:30 PM Scope Out: 12:11:03 PM Total Procedure Duration: 0 hours 8 minutes 33 seconds  Findings:      A 3 cm paraesophageal hernia was found. The hiatal narrowing was 39 cm       from the incisors. The Z-line was 36 cm from the incisors.      Prior Nissen fundoplication was found at the gastroesophageal junction.      One non-bleeding cratered gastric ulcer with a nonbleeding visible       vessel (Forrest Class IIa) was found in the cardia, located in major       curvature side, in the herniated sac. The lesion was 20 mm in largest       dimension. Coagulation for hemostasis using bipolar probe was successful.      The rest of the examined stomach was normal. Biopsies were taken with a       cold forceps for Helicobacter pylori testing.      The examined duodenum was normal. Impression:               - 3 cm paraesophageal hernia. Ulcer described below                            was located in the herniated sac.                           - Prior Nissen fundoplication was found at the                            gastroesophageal junction.                           - Non-bleeding gastric ulcer with a nonbleeding                            visible vessel (Forrest Class IIa). Treated with                            bipolar cautery.                           -  Normal stomach otherwise. Biopsied.                           - Normal examined duodenum. Moderate Sedation:      Per Anesthesia Care Recommendation:           - Return patient to hospital ward for ongoing care.                           - Full liquid diet today. Advance diet tomorrow.                           - Complete 72 hours of IV pantoprazole .                           - Continue Carafate slurry 1 g every 6 hours for 2                            months.                           - Compazine  10 mg as needed for  nausea/vomiting.                           - Daily EKGs. Monitor QTc.                           - Referral for hiatal hernia repair at [facility]. Procedure Code(s):        --- Professional ---                           [phone], 59, Esophagogastroduodenoscopy, flexible,                            transoral; with control of bleeding, any method                           43239, Esophagogastroduodenoscopy, flexible,                            transoral; with biopsy, single or multiple Diagnosis Code(s):        --- Professional ---                           K44.9, Diaphragmatic hernia without obstruction or                            gangrene                           K25.4, Chronic or unspecified gastric ulcer with                            hemorrhage  R10.9, Unspecified abdominal pain                           K92.0, Hematemesis                           K27.3, Acute peptic ulcer, site unspecified,                            without hemorrhage or perforation CPT copyright 2022 American Medical Association. All rights reserved. The codes documented in this report are preliminary and upon coder review may  be revised to meet current compliance requirements. Toribio Fortune, MD Toribio Fortune,  12/05/2023 12:24:20 PM This report has been signed electronically. Number of Addenda: 0

## 2023-12-05 NOTE — Telephone Encounter (Addendum)
 Jhonny - please arrange hospital follow up with [facility] or [facility] in 3-4 weeks. I did not see.   Tammy C - please order CBC for 1 week to follow up on anemia  Mindy -Tammy - please send referral to [facility] to request hiatal hernia repair. Dx: gastric ulcers secondary to paraesophageal hernia  Charmaine Melia, MSN, APRN, FNP-BC, AGACNP-BC [facility] Gastroenterology at [facility]

## 2023-12-06 DIAGNOSIS — K449 Diaphragmatic hernia without obstruction or gangrene: Secondary | ICD-10-CM | POA: Diagnosis not present

## 2023-12-06 DIAGNOSIS — E876 Hypokalemia: Secondary | ICD-10-CM | POA: Diagnosis not present

## 2023-12-06 DIAGNOSIS — K250 Acute gastric ulcer with hemorrhage: Secondary | ICD-10-CM | POA: Diagnosis not present

## 2023-12-06 DIAGNOSIS — R112 Nausea with vomiting, unspecified: Secondary | ICD-10-CM | POA: Diagnosis not present

## 2023-12-06 DIAGNOSIS — I10 Essential (primary) hypertension: Secondary | ICD-10-CM | POA: Diagnosis not present

## 2023-12-06 DIAGNOSIS — D62 Acute posthemorrhagic anemia: Secondary | ICD-10-CM | POA: Diagnosis not present

## 2023-12-06 DIAGNOSIS — K922 Gastrointestinal hemorrhage, unspecified: Secondary | ICD-10-CM | POA: Diagnosis not present

## 2023-12-06 LAB — HEMOGLOBIN AND HEMATOCRIT, BLOOD
HCT: 34.9 % — ABNORMAL LOW (ref 36.0–46.0)
HCT: 34.6 % — ABNORMAL LOW (ref 36.0–46.0)
HCT: 32 % — ABNORMAL LOW (ref 36.0–46.0)
Hemoglobin: 11.4 g/dL — ABNORMAL LOW (ref 12.0–15.0)
Hemoglobin: 11.2 g/dL — ABNORMAL LOW (ref 12.0–15.0)
Hemoglobin: 10.5 g/dL — ABNORMAL LOW (ref 12.0–15.0)

## 2023-12-06 LAB — TYPE AND SCREEN
ABO/RH(D): O POS
Antibody Screen: NEGATIVE
Unit division: 0
Unit division: 0

## 2023-12-06 LAB — BPAM RBC
Blood Product Expiration Date: 202510312359
Blood Product Expiration Date: 202511102359
ISSUE DATE / TIME: 202510171436
ISSUE DATE / TIME: 202510171612
Unit Type and Rh: 5100
Unit Type and Rh: 5100

## 2023-12-06 LAB — BASIC METABOLIC PANEL WITH GFR
Anion gap: 12 (ref 5–15)
BUN: 5 mg/dL — ABNORMAL LOW (ref 8–23)
CO2: 21 mmol/L — ABNORMAL LOW (ref 22–32)
Calcium: 9.2 mg/dL (ref 8.9–10.3)
Chloride: 104 mmol/L (ref 98–111)
Creatinine, Ser: 0.59 mg/dL (ref 0.44–1.00)
GFR, Estimated: >60 mL/min (ref >60)
Glucose, Bld: 122 mg/dL — ABNORMAL HIGH (ref 70–99)
Potassium: 3.2 mmol/L — ABNORMAL LOW (ref 3.5–5.1)
Sodium: 137 mmol/L (ref 135–145)

## 2023-12-06 LAB — GLUCOSE, CAPILLARY: Glucose-Capillary: 115 mg/dL — ABNORMAL HIGH (ref 70–99)

## 2023-12-06 MED ORDER — POTASSIUM CHLORIDE 20 MEQ PO PACK
20.0000 meq | PACK | Freq: Once | ORAL | Status: AC
  Administered 2023-12-06: 20 meq via ORAL
  Filled 2023-12-06: qty 1

## 2023-12-06 MED ORDER — POTASSIUM CHLORIDE 10 MEQ/100ML IV SOLN
10.0000 meq | INTRAVENOUS | Status: AC
  Administered 2023-12-06 (×2): 10 meq via INTRAVENOUS
  Filled 2023-12-06: qty 100

## 2023-12-06 NOTE — TOC Initial Note (Signed)
 Transition of Care [facility]) - Initial/Assessment Note    Patient Details  Name: Renee Gutierrez MRN: [id number] Date of Birth: 15-Jul-1960  Transition of Care [facility]) CM/SW Contact:    Lucie Lunger, LCSWA Phone Number: 12/06/2023, 11:26 AM  Clinical Narrative:                 Pt is high risk for readmission. CSW spoke with pt at bedside to complete assessment. Pt states she lives with her spouse. Pt is independent in completing her ADLs. Pt states her husband provides transportation when needed. Pt has had HH many years ago but not currently. Pt states she has a rollator to use in the home if needed. TOC to follow.   Expected Discharge Plan: Home/Self Care Barriers to Discharge: Continued Medical Work up   Patient Goals and CMS Choice Patient states their goals for this hospitalization and ongoing recovery are:: return home CMS Medicare.gov Compare Post Acute Care list provided to:: Patient Choice offered to / list presented to : Patient      Expected Discharge Plan and Services In-house Referral: Clinical Social Work Discharge Planning Services: CM Consult   Living arrangements for the past 2 months: Single Family Home                                      Prior Living Arrangements/Services Living arrangements for the past 2 months: Single Family Home Lives with:: Spouse Patient language and need for interpreter reviewed:: Yes Do you feel safe going back to the place where you live?: Yes      Need for Family Participation in Patient Care: Yes (Comment) Care giver support system in place?: Yes (comment) Current home services: DME Criminal Activity/Legal Involvement Pertinent to Current Situation/Hospitalization: No - Comment as needed  Activities of Daily Living   ADL Screening (condition at time of admission) Independently performs ADLs?: Yes (appropriate for developmental age) Is the patient deaf or have difficulty hearing?: No Does the patient have difficulty  seeing, even when wearing glasses/contacts?: No Does the patient have difficulty concentrating, remembering, or making decisions?: Yes  Permission Sought/Granted                  Emotional Assessment Appearance:: Appears stated age Attitude/Demeanor/Rapport: Engaged Affect (typically observed): Accepting Orientation: : Oriented to Self, Oriented to Place, Oriented to  Time, Oriented to Situation Alcohol / Substance Use: Not Applicable Psych Involvement: No (comment)  Admission diagnosis:  GIB (gastrointestinal bleeding) [K92.2] Patient Active Problem List   Diagnosis Date Noted   GIB (gastrointestinal bleeding) 12/04/2023   Acute gastric ulcer with hemorrhage 11/28/2023   Acute post-hemorrhagic anemia 11/27/2023   Prolonged QT interval 11/26/2023   Esophageal hiatal hernia 11/26/2023   Essential hypertension 11/26/2023   Chronic anemia 11/02/2023   Trigeminal neuralgia 05/13/2023   Anxiety 05/30/2020   AVM (arteriovenous malformation) brain 05/30/2020   External otitis of left ear 05/30/2020   Hypokalemia 05/30/2020   Hyperlipidemia 05/30/2020   History of intussusception 05/30/2020   Migraines 05/30/2020   Restless legs syndrome 05/30/2020   Kidney disease, chronic, stage II (mild, 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) 05/30/2020   Intussusception (HCC) 05/30/2020   Medicare annual wellness visit, subsequent 05/30/2020   MRSA (methicillin resistant staph aureus) culture positive 05/30/2020   Rash 05/30/2020   Prediabetes 05/30/2020   Nausea & vomiting 05/30/2020   Chronic hip pain 05/30/2020   IBS (irritable bowel syndrome)  05/30/2020   Long-term current use of opiate analgesic 11/19/2018   ACTH elevation 12/12/2016   Trigeminal neuralgia pain 07/31/2016   Resistant hypertension 02/18/2016   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   Ganglion cyst of dorsum of right wrist 09/13/2015   Mild intermittent asthma without complication 04/25/2015   Idiopathic peripheral neuropathy  11/22/2014   Adrenal adenoma, right 07/10/2014   Prolactinoma (HCC) 07/07/2014   Osteoarthritis of cervical spine 06/06/2014   Cubital tunnel syndrome 08/24/2013   Spinal stenosis of lumbar region with neurogenic claudication 08/03/2013   GAD (generalized anxiety disorder) 09/12/2011   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 08/08/2011   Severe episode of recurrent major depressive disorder (HCC) 08/08/2011   GERD (gastroesophageal reflux disease) 11/01/2009   PCP:  Patient, No Pcp Per Pharmacy:   [id number] [place], [place] - [address] AT [facility] [address] [place] [address] Phone: [phone] Fax: [phone]  [facility] (Now [facility]) - [address] [place], [place] - [address] [place] [address] Phone: [phone] Fax: [phone]     Social Drivers of Health (SDOH) Social History: SDOH Screenings   Food Insecurity: Food Insecurity Present (12/05/2023)  Housing: Low Risk  (12/05/2023)  Transportation Needs: No Transportation Needs (12/05/2023)  Utilities: Not At Risk (12/05/2023)  Tobacco Use: High Risk (12/04/2023)   SDOH Interventions:     Readmission Risk Interventions    12/06/2023   11:26 AM 11/28/2023   10:56 AM 11/27/2023   10:16 AM  Readmission Risk Prevention Plan  Transportation Screening Complete Complete Complete  Medication Review Oceanographer) Complete Complete Complete  HRI or Home Care Consult Complete Complete Complete  SW Recovery Care/Counseling Consult Complete Complete Complete  Palliative Care Screening Not Applicable Not Applicable Not Applicable  Skilled Nursing Facility Not Applicable Not Applicable Not Applicable

## 2023-12-06 NOTE — Plan of Care (Signed)
   Problem: Education: Goal: Knowledge of General Education information will improve Description: Including pain rating scale, medication(s)/side effects and non-pharmacologic comfort measures Outcome: Progressing   Problem: Clinical Measurements: Goal: Ability to maintain clinical measurements within normal limits will improve Outcome: Progressing Goal: Diagnostic test results will improve Outcome: Progressing

## 2023-12-06 NOTE — Progress Notes (Signed)
 Toribio Fortune, M.D. Gastroenterology & Hepatology   Interval History:  No acute events overnight. Patient reports feeling much better although she is still having some abdominal pain.  No nausea or vomiting.  Tolerating oral intake.  Hemoglobin has remained stable above 11. Patient is asking if she can have regular diet.  Inpatient Medications:  Current Facility-Administered Medications:    0.9 %  sodium chloride  infusion (Manually program via Guardrails IV Fluids), , Intravenous, Once, Willette Adriana LABOR, MD, Held at 12/05/23 0804   acetaminophen  (TYLENOL ) tablet 650 mg, 650 mg, Oral, Q6H PRN **OR** acetaminophen  (TYLENOL ) suppository 650 mg, 650 mg, Rectal, Q6H PRN, Shahmehdi, Seyed A, MD   alum & mag hydroxide-simeth (MAALOX/MYLANTA) 200-200-20 MG/5ML suspension 30 mL, 30 mL, Oral, Q6H PRN, 30 mL at 12/04/23 1333 **AND** [COMPLETED] lidocaine (XYLOCAINE) 2 % viscous mouth solution 15 mL, 15 mL, Oral, Once, Shahmehdi, Seyed A, MD, 15 mL at 12/04/23 1330   amLODipine  (NORVASC ) tablet 5 mg, 5 mg, Oral, Daily, Shahmehdi, Seyed A, MD, 5 mg at 12/06/23 9082   bisacodyl (DULCOLAX) EC tablet 5 mg, 5 mg, Oral, Daily PRN, Shahmehdi, Seyed A, MD   cefTRIAXone (ROCEPHIN) 1 g in sodium chloride  0.9 % 100 mL IVPB, 1 g, Intravenous, Q24H, Shahmehdi, Seyed A, MD, Last Rate: 200 mL/hr at 12/05/23 2007, 1 g at 12/05/23 2007   DULoxetine  (CYMBALTA ) DR capsule 60 mg, 60 mg, Oral, BID, Shahmehdi, Seyed A, MD, 60 mg at 12/06/23 0916   furosemide (LASIX) injection 20 mg, 20 mg, Intravenous, Once, Shahmehdi, Seyed A, MD   furosemide (LASIX) injection 20 mg, 20 mg, Intravenous, Once, Shahmehdi, Seyed A, MD   hydrALAZINE  (APRESOLINE ) injection 10 mg, 10 mg, Intravenous, Q4H PRN, Shahmehdi, Seyed A, MD, 10 mg at 12/05/23 1414   HYDROmorphone  (DILAUDID ) injection 2 mg, 2 mg, Intravenous, Q2H PRN, Shahmehdi, Seyed A, MD, 2 mg at 12/06/23 0538   influenza vac split trivalent PF (FLUZONE ) injection 0.5 mL, 0.5 mL,  Intramuscular, Tomorrow-1000, Shahmehdi, Seyed A, MD   ipratropium (ATROVENT) nebulizer solution 0.5 mg, 0.5 mg, Nebulization, Q6H PRN, Shahmehdi, Seyed A, MD   labetalol  (NORMODYNE ) tablet 200 mg, 200 mg, Oral, BID, Shahmehdi, Seyed A, MD, 200 mg at 12/06/23 0917   metoCLOPramide  (REGLAN ) injection 10 mg, 10 mg, Intravenous, Q8H, Shahmehdi, Seyed A, MD, 10 mg at 12/06/23 0534   [COMPLETED] pantoprazole  (PROTONIX ) injection 40 mg, 40 mg, Intravenous, Q5 min, 40 mg at 12/04/23 0818 **FOLLOWED BY** pantoprazole  (PROTONIX ) injection 40 mg, 40 mg, Intravenous, Q12H, Yolande Lamar BROCKS, MD, 40 mg at 12/06/23 9072   pneumococcal 20-valent conjugate vaccine (PREVNAR 20) injection 0.5 mL, 0.5 mL, Intramuscular, Tomorrow-1000, Shahmehdi, Seyed A, MD   prochlorperazine  (COMPAZINE ) injection 10 mg, 10 mg, Intravenous, Q6H PRN, Castaneda Mayorga, Soma Bachand, MD   senna-docusate (Senokot-S) tablet 1 tablet, 1 tablet, Oral, QHS PRN, Shahmehdi, Seyed A, MD   sodium chloride  flush (NS) 0.9 % injection 3 mL, 3 mL, Intravenous, Q12H, Shahmehdi, Seyed A, MD, 3 mL at 12/06/23 0933   sodium chloride  flush (NS) 0.9 % injection 3 mL, 3 mL, Intravenous, Q12H, Shahmehdi, Seyed A, MD, 3 mL at 12/06/23 0933   sodium phosphate  (FLEET) enema 1 enema, 1 enema, Rectal, Once PRN, Shahmehdi, Seyed A, MD   sucralfate (CARAFATE) 1 GM/10ML suspension 1 g, 1 g, Oral, TID WC & HS, Shahmehdi, Seyed A, MD, 1 g at 12/06/23 0918   traZODone (DESYREL) tablet 25 mg, 25 mg, Oral, QHS PRN, Shahmehdi, Seyed A, MD, 25 mg at 12/06/23 [phone]  I/O    Intake/Output Summary (Last 24 hours) at 12/06/2023 0941 Last data filed at 12/06/2023 0900 Gross per 24 hour  Intake 1473.42 ml  Output --  Net 1473.42 ml     Physical Exam: Temp:  [97.9 F (36.6 C)-100.4 F (38 C)] 98.8 F (37.1 C) (10/18 0840) Pulse Rate:  [87-119] 91 (10/18 0917) Resp:  [15-18] 18 (10/18 0840) BP: (103-186)/(50-92) 149/76 (10/18 0917) SpO2:  [93 %-100 %] 94 % (10/18  0840) Weight:  [46 kg-48.1 kg] 48.1 kg (10/18 0403)  Temp (24hrs), Avg:98.5 F (36.9 C), Min:97.9 F (36.6 C), Max:100.4 F (38 C)  GENERAL: The patient is AO x3, in no acute distress. HEENT: Head is normocephalic and atraumatic. EOMI are intact. Mouth is well hydrated and without lesions. NECK: Supple. No masses LUNGS: Clear to auscultation. No presence of rhonchi/wheezing/rales. Adequate chest expansion HEART: RRR, normal s1 and s2. ABDOMEN: Mildly tender in the upper abdomen, no guarding, no peritoneal signs, and nondistended. BS +. No masses. EXTREMITIES: Without any cyanosis, clubbing, rash, lesions or edema. NEUROLOGIC: AOx3, no focal motor deficit. SKIN: no jaundice, no rashes  Laboratory Data: CBC:     Component Value Date/Time   WBC 17.1 (H) 12/04/2023 0805   RBC 2.80 (L) 12/04/2023 0805   HGB 11.2 (L) 12/06/2023 0858   HCT 34.6 (L) 12/06/2023 0858   PLT 743 (H) 12/04/2023 0805   MCV 86.1 12/04/2023 0805   MCH 27.1 12/04/2023 0805   MCHC 31.5 12/04/2023 0805   RDW 15.3 12/04/2023 0805   LYMPHSABS 1.8 12/04/2023 0805   MONOABS 1.4 (H) 12/04/2023 0805   EOSABS 0.1 12/04/2023 0805   BASOSABS 0.1 12/04/2023 0805   COAG:  Lab Results  Component Value Date   INR 1.0 12/05/2023   INR 0.9 12/04/2023   INR 1.0 05/14/2023    BMP:     Latest Ref Rng & Units 12/06/2023    3:45 AM 12/05/2023    3:09 AM 12/04/2023    3:34 PM  BMP  Glucose 70 - 99 mg/dL 877  875  86   BUN 8 - 23 mg/dL 5  <5  <5   Creatinine 0.44 - 1.00 mg/dL 9.40  9.30  9.43   Sodium 135 - 145 mmol/L 137  140  140   Potassium 3.5 - 5.1 mmol/L 3.2  3.2  2.7   Chloride 98 - 111 mmol/L 104  106  105   CO2 22 - 32 mmol/L 21  21  19    Calcium  8.9 - 10.3 mg/dL 9.2  8.7  8.8     HEPATIC:     Latest Ref Rng & Units 12/04/2023    8:05 AM 12/01/2023    2:13 PM 11/26/2023    5:44 AM  Hepatic Function  Total Protein 6.5 - 8.1 g/dL 5.7  5.7  4.8   Albumin 3.5 - 5.0 g/dL 3.1  3.3  2.7   AST 15 - 41 U/L  12  13  15    ALT 0 - 44 U/L 7  7  8    Alk Phosphatase 38 - 126 U/L 64  84  60   Total Bilirubin 0.0 - 1.2 mg/dL <9.7  <9.7  <9.7     CARDIAC:  Lab Results  Component Value Date   CKTOTAL 40 08/01/2010   CKMB 1.5 08/01/2010   TROPONINI <0.30 08/01/2010      Imaging: I personally reviewed and interpreted the available labs, imaging and endoscopic files.   Assessment/Plan: 63 y.o.  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   Patient had worsening drop in hemoglobin during admission with hemoglobin of 7.6.  Due to this, she underwent EGD yesterday which showed a 3 cm paraesophageal hernia with a prior Nissen fundoplication and presence of a nonbleeding gastric ulcer measuring 2 cm and with a nonbleeding visible vessel which was ablated with bipolar probe.  Rest of the examination was normal.  Patient has presence of ulceration of the cardia in a atypicaln location.  It is very possible this is related to the herniated sac and ischemia related to this.  I discussed with the patient that she will benefit from evaluation by foregut surgeon for possible hiatal hernia repair at [facility] as this may allow better healing of the ulceration.  Notably, her anatomy may be more complex as she had prior Nissen fundoplication.  For now, we will continue with IV pantoprazole  for a total of 72 hours (last dose tomorrow morning) and Carafate 1 g every 6 hours.  She will also need to continue on Compazine  as needed for nausea/vomiting, which has provided symptom relief.  -  Advance diet to GI soft diet - Complete 72 hours of IV pantoprazole , last dose tomorrow morning - Continue Carafate slurry 1 g every 6 hours for 2 months. - Compazine  10 mg as needed for nausea/vomiting. - Daily EKGs. Monitor QTc. - Referral sent for hiatal hernia repair at [facility]. - Patient will follow up in GI clinic with Dr. Cindie in GI clinic. - GI service will sign-off, please call us  back if you have any more questions.  Toribio Fortune, MD Gastroenterology and Hepatology [facility]astro[facility]ology

## 2023-12-06 NOTE — Progress Notes (Signed)
 PROGRESS NOTE  Renee Gutierrez FMW:[id number] DOB: [birth year] DOA: 12/04/2023 PCP: Patient, No Pcp Per  Brief History:  Renee Gutierrez is a 63-year-old female with a history of multiple comorbidities including PUD, GERD, IBS, HTN, CKD, fibromyalgia, migraines, restless leg syndrome.. With recent admission and discharged on 11/29/2023 for intractable nausea vomiting, gastric ulcer-performing EGD on 11/28/2023.SABRASABRACame back again with nausea vomiting, hematemesis.   Reports that her symptoms of abdominal pain nausea vomiting started 3 days ago and progressively getting worse.  In past 24 hours having bloody vomitus.  Has not taken any NSAIDs and not on any blood thinners. Patient had worsening drop in hemoglobin during admission with hemoglobin of 7.6.   ED evaluation: Blood pressure (!) 174/105, pulse (!) 110, temperature 98.4 F (36.9 C), temperature source Oral, resp. rate 19, height 4' 11 (1.499 m), weight 44.5 kg, SpO2 97%. LABs: Potassium 2.4, magnesium  1.7, WBC 17.1, hemoglobin 7.6  CT angio GI bleed: IMPRESSION: 1. 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 No active GI bleeding by CTA. No acute bowel inflammation identified. 2. Small but increased left pleural effusion and associated increased left lung base atelectasis or consolidation.  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 Rest of the examination was normal.     Assessment/Plan: Upper GIB (gastrointestinal bleeding)/ Acute blood loss anemia - Hematemesis-acute upper GI bleed with recent history of large gastric ulcer found on EGD on 11/28/2023 -GI consulted -appreciate evaluation recommendations   - IVF  initially started prior to EGD - EGD 12/05/2023 - FINDINGS: - 3 cm paraesophageal hernia. Ulcer described below was located in the herniated sac. - Non-bleeding gastric ulcer with a nonbleeding visible vessel (Forrest Class IIa). Treated with bipolar cautery.  - Normal duodenum, stomach. Biopsied.    RECOMMENDATIONS  - Full liquid diet today. Advance diet tomorrow. - Complete 72 hours of IV pantoprazole . - Compazine  10 mg as needed for nausea/vomiting. - Hemoglobin drop from 9>>7.3 -transfused 2 units this admission   Nausea & vomiting -Improved Intractable nausea vomiting, with hematemesis - Patient received multiple antiemetic IV medication in ED -post EGD, diet gradually advanced which pt tolerated     Acute gastric ulcer with hemorrhage Recent hospitalization and EGD on 11/28/2023, revealing large ulcer in the first portion of the stomach distal to esophagus was thought likely due to NSAID use patient was discharged on PPI twice daily, Carafate 4 times a day    Acute post-hemorrhagic anemia - Trend H&H closely, her hemoglobin dropped from 9->>> 7.3 - Transfusing 2U PRBC 12/05/2023 - H/H stable post transfusion    Hyperlipidemia Continue statins when tolerating p.o.   GERD (gastroesophageal reflux disease) Continue Protonix  40 mg IV twice daily -Carafate p.o. 3 times daily      Hypokalemia - Due to hyperemesis -Serum potassium 2.4, magnesium  1. 7 -Replacing both potassium and magnesium       Essential hypertension - Currently stable, anemic anticipating hypotension -Reviewing home medication holding BP meds, as needed hydralazine    Esophageal hiatal hernia - Chronic, present on imaging -Patient has presence of ulceration of the cardia in a atypicaln location.  It is very possible this is related to the herniated sac and ischemia related  to this  -GI sent referral to [facility]   GAD (generalized anxiety disorder) - Currently stable, continue as  needed Xanax as needed IV Ativan or Haldol   Opioid dependent, chronic pain  - Continue with IV analgesics, once tolerating p.o. resuming home regimen - PDMP reviewed  - morphine  IR 15 mg, #60, last fill 11/05/23   Presumed pneumonia  - Recent history per family/patient unable to take or complete PO  antibiotics Due to leukocytosis-will continue empiric IV antibiotic Rocephin -CTA chest neg for PE--small left pleural effusion, atelectasis        Family Communication:   Family at bedside  Consultants:  GI  Code Status:  FULL   DVT Prophylaxis:  SCDs   Procedures: As Listed in Progress Note Above  Antibiotics: Ceftriaxone 10/16>>      Subjective: Pt has some mild abd pain with soft diet.  Denies f/c, cp, sob, n/v/d, hematemesis  Objective: Vitals:   12/06/23 0403 12/06/23 0840 12/06/23 0917 12/06/23 1309  BP: (!) 163/92 (!) 149/76 (!) 149/76 126/76  Pulse: 96 91 91 87  Resp: 17 18  17   Temp: 97.9 F (36.6 C) 98.8 F (37.1 C)  98.6 F (37 C)  TempSrc:  Oral  Oral  SpO2: 93% 94%  92%  Weight: 48.1 kg     Height:        Intake/Output Summary (Last 24 hours) at 12/06/2023 1800 Last data filed at 12/06/2023 1300 Gross per 24 hour  Intake 600 ml  Output --  Net 600 ml   Weight change: 1.47 kg Exam:  General:  Pt is alert, follows commands appropriately, not in acute distress HEENT: No icterus, No thrush, No neck mass, [place]/AT Cardiovascular: RRR, S1/S2, no rubs, no gallops Respiratory: bibasilar crackles. No wheeze Abdomen: Soft/+BS, upper abd tender, non distended, no guarding Extremities: No edema, No lymphangitis, No petechiae, No rashes, no synovitis   Data Reviewed: I have personally reviewed following labs and imaging studies Basic Metabolic Panel: Recent Labs  Lab 12/01/23 1413 12/04/23 0805 12/04/23 1534 12/05/23 0309 12/06/23 0345  NA 142 142 140 140 137  K 3.1* 2.4* 2.7* 3.2* 3.2*  CL 108 107 105 106 104  CO2 21* 22 19* 21* 21*   GLUCOSE 102* 111* 86 124* 122*  BUN 12 <5* <5* <5* 5*  CREATININE 1.02* 0.59 0.56 0.69 0.59  CALCIUM  9.5 8.8* 8.8* 8.7* 9.2  MG 2.0 1.7  --   --   --   PHOS  --  2.3*  --   --   --    Liver Function Tests: Recent Labs  Lab 12/01/23 1413 12/04/23 0805  AST 13* 12*  ALT 7 7  ALKPHOS 84 64  BILITOT <0.2 <0.2  PROT 5.7* 5.7*  ALBUMIN 3.3* 3.1*   Recent Labs  Lab 12/01/23 1413 12/04/23 0805  LIPASE 23 12   No results for input(s): AMMONIA in the last 168 hours. Coagulation Profile: Recent Labs  Lab 12/04/23 0805 12/05/23 0309  INR 0.9 1.0   CBC: Recent Labs  Lab 12/01/23 1413 12/04/23 0805 12/04/23 1423 12/05/23 0903 12/05/23 2107 12/06/23 0345 12/06/23 0858 12/06/23 1414  WBC 19.5* 17.1*  --   --   --   --   --   --   NEUTROABS 15.7* 13.5*  --   --   --   --   --   --   HGB 9.0* 7.6*   < > 7.6* 11.0* 11.4* 11.2* 10.5*  HCT 30.0* 24.1*   < > 25.3* 33.5* 34.9* 34.6* 32.0*  MCV 89.6 86.1  --   --   --   --   --   --   PLT 864* 743*  --   --   --   --   --   --    < > = values in this interval not displayed.   Cardiac Enzymes: No results for input(s): CKTOTAL, CKMB, CKMBINDEX, TROPONINI in the last 168 hours. BNP: Invalid input(s): POCBNP CBG: Recent Labs  Lab 12/05/23 0740 12/06/23 0719  GLUCAP 129* 115*   HbA1C: No results for input(s): HGBA1C in the last 72 hours. Urine analysis:    Component Value Date/Time   COLORURINE STRAW (A) 11/25/2023 2053   APPEARANCEUR CLEAR 11/25/2023 2053   LABSPEC 1.011 11/25/2023 2053   PHURINE 7.0 11/25/2023 2053   GLUCOSEU NEGATIVE 11/25/2023 2053   HGBUR NEGATIVE 11/25/2023 2053   BILIRUBINUR NEGATIVE 11/25/2023 2053   KETONESUR 20 (A) 11/25/2023 2053   PROTEINUR NEGATIVE 11/25/2023 2053   UROBILINOGEN 0.2 12/03/2014 1500   NITRITE NEGATIVE 11/25/2023 2053   LEUKOCYTESUR NEGATIVE 11/25/2023 2053   Sepsis Labs: @LABRCNTIP (procalcitonin:4,lacticidven:4) )No results found for this or any  previous visit (from the past 240 hours).   Scheduled Meds:  sodium chloride    Intravenous Once   amLODipine   5 mg Oral Daily   DULoxetine   60 mg Oral BID   furosemide  20 mg Intravenous Once   furosemide  20 mg Intravenous Once   influenza vac split trivalent PF  0.5 mL Intramuscular Tomorrow-1000   labetalol   200 mg Oral BID   metoCLOPramide  (REGLAN ) injection  10 mg Intravenous Q8H   pantoprazole  (PROTONIX ) IV  40 mg Intravenous Q12H   pneumococcal 20-valent conjugate vaccine  0.5 mL Intramuscular Tomorrow-1000   sodium chloride  flush  3 mL Intravenous Q12H   sodium chloride  flush  3 mL Intravenous Q12H   sucralfate  1 g Oral TID WC & HS   Continuous Infusions:  cefTRIAXone (ROCEPHIN)  IV 1 g (12/05/23 2007)    Procedures/Studies: CT ANGIO GI BLEED Result Date: 12/04/2023 EXAM: CTA ABDOMEN AND PELVIS WITH CONTRAST 12/04/2023 09:59:01 AM TECHNIQUE: CTA images of the abdomen and pelvis with intravenous contrast. Three-dimensional MIP/volume rendered formations were performed. Automated exposure control, iterative reconstruction, and/or weight based adjustment of the mA/kV was utilized to reduce the radiation dose to as low as reasonably achievable. COMPARISON: CTA chest and CT abdomen and pelvis 11/25/2023. CLINICAL HISTORY: 63 year old female with abdominal pain, hematemesis, and suspected bleeding ulcers. FINDINGS: VASCULATURE: Stable aortoiliac atherosclerosis. Major arterial structures are patent and stable. Portal venous system appears patent on the delayed images. GI BLEED: No contrast extravasation identified into the stomach or duodenum, or elswhere. ABDOMEN/PELVIS: LOWER CHEST: Small but increased layering left pleural effusion with simple fluid density. Associated increased left lung base atelectasis or consolidation. No pericardial effusion. Right lung base more stable with curvilinear scarring or atelectasis. LIVER: The liver is unremarkable. GALLBLADDER AND BILE DUCTS: Chronic  cholecystectomy. Chronic post cholecystectomy CBD enlargement is stable. SPLEEN: The spleen is unremarkable. PANCREAS: The pancreas is unremarkable. ADRENAL GLANDS: Bilateral adrenal glands demonstrate no acute abnormality. KIDNEYS, URETERS AND BLADDER: Symmetric renal enhancement and early contrast excretion. No stones in the kidneys or ureters. No hydronephrosis. No perinephric or periureteral stranding. Urinary bladder is unremarkable. GI AND BOWEL: Large gastrocaudal hernia redemonstrated. Evidence of chronic fundoplication and herniated appearance of the fundoplication. No definite acute gastroduodenal inflammation. Non dilated large and  small bowel loops. There is no bowel obstruction. No abnormal bowel wall thickening or distension. REPRODUCTIVE: Reproductive organs are unremarkable. PERITONEUM AND RETROPERITONEUM: No pneumoperitoneum. No free fluid identified. LYMPH NODES: No lymphadenopathy. BONES AND SOFT TISSUES: Chronic spine degeneration in the setting of advanced levoconvex scoliosis. No acute abnormality of the bones. No acute soft tissue abnormality. IMPRESSION: 1. 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 No active GI bleeding by CTA. No acute bowel inflammation identified. 2. Small but increased left pleural effusion and associated increased left lung base atelectasis or consolidation. Electronically signed by: Helayne Hurst MD 12/04/2023 10:11 AM EDT RP Workstation: [id number]   DG Abd Acute W/Chest Result Date: 12/01/2023 CLINICAL DATA:  Abdominal pain. EXAM: DG ABDOMEN ACUTE WITH 1 VIEW CHEST COMPARISON:  11/25/2023 FINDINGS: Nonobstructive bowel-gas pattern. No evidence of free air. No radiopaque calculi identified. Pelvic phleboliths. Cholecystectomy clips in the right upper quadrant. Heart size and mediastinal contours are unchanged. Hiatal hernia. Linear scarring/subsegmental atelectasis again noted at the left lung base. Aortic atherosclerosis.  Levoscoliotic curvature of the thoracolumbar spine. IMPRESSION: 1. Nonobstructive bowel-gas pattern. 2. Hiatal hernia. 3. No acute cardiopulmonary findings. Linear scarring/subsegmental atelectasis again noted at the left lung base. Electronically Signed   By: Harrietta Sherry M.D.   On: 12/01/2023 14:56   CT Angio Chest PE W and/or Wo Contrast Result Date: 11/25/2023 CLINICAL DATA:  Pulmonary embolus suspected with high probability. Recent discharge from [facility] for low potassium, pneumonia, and bacterial infection of GI tract. Acute nonlocalized abdominal pain. EXAM: CT ANGIOGRAPHY CHEST CT ABDOMEN AND PELVIS WITH CONTRAST TECHNIQUE: Multidetector CT imaging of the chest was performed using the standard protocol during bolus administration of intravenous contrast. Multiplanar CT image reconstructions and MIPs were obtained to evaluate the vascular anatomy. Multidetector CT imaging of the abdomen and pelvis was performed using the standard protocol during bolus administration of intravenous contrast. RADIATION DOSE REDUCTION: This exam was performed according to the departmental dose-optimization program which includes automated exposure control, adjustment of the mA and/or kV according to patient size and/or use of iterative reconstruction technique. CONTRAST:  OMNIPAQUE  IOHEXOL  350 MG/ML SOLN COMPARISON:  Chest radiograph 11/25/2023. CT abdomen and pelvis 09/20/2023. CT chest 07/05/2011 FINDINGS: CTA CHEST FINDINGS Cardiovascular: Technically adequate study with good opacification of the central and segmental pulmonary arteries. Moderate motion artifact. No focal filling defects. No evidence of significant pulmonary embolus. Cardiac enlargement. Small pericardial effusion. Normal caliber thoracic aorta. No aortic dissection. Great vessel origins are patent. Calcification of the aorta and coronary arteries. Mediastinum/Nodes: Large esophageal hiatal hernia. Esophagus is decompressed. No  significant lymphadenopathy. Thyroid gland is unremarkable. Lungs/Pleura: Trace left pleural effusion. Atelectasis in the left base. No pneumothorax. Musculoskeletal: Postoperative changes in the cervical spine. Degenerative changes in the thoracic spine. No acute bony abnormalities. Review of the MIP images confirms the above findings. CT ABDOMEN and PELVIS FINDINGS Hepatobiliary: No focal liver abnormality is seen. Status post cholecystectomy. No biliary dilatation. Pancreas: Unremarkable. No pancreatic ductal dilatation or surrounding inflammatory changes. Spleen: Normal in size without focal abnormality. Adrenals/Urinary Tract: Adrenal glands are unremarkable. Kidneys are normal, without renal calculi, focal lesion, or hydronephrosis. Bladder is unremarkable. Stomach/Bowel: Stomach, small bowel, and colon are not abnormally distended. Under distention limits evaluation but there is evidence of wall thickening in the rectosigmoid region which likely indicates colitis. This may represent infectious or inflammatory etiologies. No abscess identified. Appendix is not visualized. Vascular/Lymphatic: Aortic atherosclerosis. No enlarged abdominal or pelvic lymph nodes. Reproductive: Status post  hysterectomy. No adnexal masses. Other: No free air or free fluid in the abdomen. Abdominal wall musculature appears intact. Edema in the subcutaneous fat. Musculoskeletal: Lumbar scoliosis convex towards the left. Degenerative changes in the lumbar spine. No acute bony abnormalities. Review of the MIP images confirms the above findings. IMPRESSION: 1. No evidence of significant pulmonary embolus. 2. Large esophageal hiatal hernia. 3. Small left pleural effusion with basilar atelectasis. 4. Aortic atherosclerosis. 5. Wall thickening in the rectosigmoid colon suggesting colitis. No abscess. No proximal obstruction. Electronically Signed   By: Elsie Gravely M.D.   On: 11/25/2023 22:21   CT ABDOMEN PELVIS W CONTRAST Result  Date: 11/25/2023 CLINICAL DATA:  Pulmonary embolus suspected with high probability. Recent discharge from [facility] for low potassium, pneumonia, and bacterial infection of GI tract. Acute nonlocalized abdominal pain. EXAM: CT ANGIOGRAPHY CHEST CT ABDOMEN AND PELVIS WITH CONTRAST TECHNIQUE: Multidetector CT imaging of the chest was performed using the standard protocol during bolus administration of intravenous contrast. Multiplanar CT image reconstructions and MIPs were obtained to evaluate the vascular anatomy. Multidetector CT imaging of the abdomen and pelvis was performed using the standard protocol during bolus administration of intravenous contrast. RADIATION DOSE REDUCTION: This exam was performed according to the departmental dose-optimization program which includes automated exposure control, adjustment of the mA and/or kV according to patient size and/or use of iterative reconstruction technique. CONTRAST:  OMNIPAQUE  IOHEXOL  350 MG/ML SOLN COMPARISON:  Chest radiograph 11/25/2023. CT abdomen and pelvis 09/20/2023. CT chest 07/05/2011 FINDINGS: CTA CHEST FINDINGS Cardiovascular: Technically adequate study with good opacification of the central and segmental pulmonary arteries. Moderate motion artifact. No focal filling defects. No evidence of significant pulmonary embolus. Cardiac enlargement. Small pericardial effusion. Normal caliber thoracic aorta. No aortic dissection. Great vessel origins are patent. Calcification of the aorta and coronary arteries. Mediastinum/Nodes: Large esophageal hiatal hernia. Esophagus is decompressed. No significant lymphadenopathy. Thyroid gland is unremarkable. Lungs/Pleura: Trace left pleural effusion. Atelectasis in the left base. No pneumothorax. Musculoskeletal: Postoperative changes in the cervical spine. Degenerative changes in the thoracic spine. No acute bony abnormalities. Review of the MIP images confirms the above findings. CT ABDOMEN and PELVIS  FINDINGS Hepatobiliary: No focal liver abnormality is seen. Status post cholecystectomy. No biliary dilatation. Pancreas: Unremarkable. No pancreatic ductal dilatation or surrounding inflammatory changes. Spleen: Normal in size without focal abnormality. Adrenals/Urinary Tract: Adrenal glands are unremarkable. Kidneys are normal, without renal calculi, focal lesion, or hydronephrosis. Bladder is unremarkable. Stomach/Bowel: Stomach, small bowel, and colon are not abnormally distended. Under distention limits evaluation but there is evidence of wall thickening in the rectosigmoid region which likely indicates colitis. This may represent infectious or inflammatory etiologies. No abscess identified. Appendix is not visualized. Vascular/Lymphatic: Aortic atherosclerosis. No enlarged abdominal or pelvic lymph nodes. Reproductive: Status post hysterectomy. No adnexal masses. Other: No free air or free fluid in the abdomen. Abdominal wall musculature appears intact. Edema in the subcutaneous fat. Musculoskeletal: Lumbar scoliosis convex towards the left. Degenerative changes in the lumbar spine. No acute bony abnormalities. Review of the MIP images confirms the above findings. IMPRESSION: 1. No evidence of significant pulmonary embolus. 2. Large esophageal hiatal hernia. 3. Small left pleural effusion with basilar atelectasis. 4. Aortic atherosclerosis. 5. Wall thickening in the rectosigmoid colon suggesting colitis. No abscess. No proximal obstruction. Electronically Signed   By: Elsie Gravely M.D.   On: 11/25/2023 22:21   DG Chest Port 1 View Result Date: 11/25/2023 EXAM: 1 VIEW(S) XRAY OF THE CHEST 11/25/2023 08:15:00 PM COMPARISON:  07/06/2023 CLINICAL HISTORY: Questionable sepsis - evaluate for abnormality. FINDINGS: LUNGS AND PLEURA: No focal pulmonary opacity. No pulmonary edema. No pleural effusion. No pneumothorax. HEART AND MEDIASTINUM: Normal heart size and mediastinal contours. Atherosclerotic  calcifications. BONES AND SOFT TISSUES: Cervical fusion hardware is noted. No acute osseous abnormality. DIAPHRAGM AND UPPER ABDOMEN: A small hiatal hernia is present. IMPRESSION: 1. No acute abnormalities. Electronically signed by: Andrea Gasman MD 11/25/2023 08:25 PM EDT RP Workstation: [id number]    Alm Schneider, DO  [facility]  If 7PM-7AM, please contact night-coverage [email or web address] Password [email or web address] 12/06/2023, 6:00 PM   LOS: 1 day

## 2023-12-07 DIAGNOSIS — R109 Unspecified abdominal pain: Secondary | ICD-10-CM | POA: Diagnosis not present

## 2023-12-07 DIAGNOSIS — K922 Gastrointestinal hemorrhage, unspecified: Secondary | ICD-10-CM | POA: Diagnosis not present

## 2023-12-07 DIAGNOSIS — R1010 Upper abdominal pain, unspecified: Secondary | ICD-10-CM

## 2023-12-07 DIAGNOSIS — D62 Acute posthemorrhagic anemia: Secondary | ICD-10-CM | POA: Diagnosis not present

## 2023-12-07 LAB — BASIC METABOLIC PANEL WITH GFR
Anion gap: 9 (ref 5–15)
BUN: 6 mg/dL — ABNORMAL LOW (ref 8–23)
CO2: 23 mmol/L (ref 22–32)
Calcium: 8.9 mg/dL (ref 8.9–10.3)
Chloride: 108 mmol/L (ref 98–111)
Creatinine, Ser: 0.55 mg/dL (ref 0.44–1.00)
GFR, Estimated: >60 mL/min (ref >60)
Glucose, Bld: 103 mg/dL — ABNORMAL HIGH (ref 70–99)
Potassium: 3.7 mmol/L (ref 3.5–5.1)
Sodium: 140 mmol/L (ref 135–145)

## 2023-12-07 LAB — CBC
HCT: 33.1 % — ABNORMAL LOW (ref 36.0–46.0)
Hemoglobin: 10.6 g/dL — ABNORMAL LOW (ref 12.0–15.0)
MCH: 28 pg (ref 26.0–34.0)
MCHC: 32 g/dL (ref 30.0–36.0)
MCV: 87.3 fL (ref 80.0–100.0)
Platelets: 543 K/uL — ABNORMAL HIGH (ref 150–400)
RBC: 3.79 MIL/uL — ABNORMAL LOW (ref 3.87–5.11)
RDW: 15.4 % (ref 11.5–15.5)
WBC: 6.4 K/uL (ref 4.0–10.5)
nRBC: 0 % (ref 0.0–0.2)

## 2023-12-07 LAB — GLUCOSE, CAPILLARY: Glucose-Capillary: 106 mg/dL — ABNORMAL HIGH (ref 70–99)

## 2023-12-07 LAB — MAGNESIUM: Magnesium: 2 mg/dL (ref 1.7–2.4)

## 2023-12-07 MED ORDER — SUCRALFATE 1 GM/10ML PO SUSP: 1.0000 g | Freq: Three times a day (TID) | ORAL | 3 refills | Status: DC

## 2023-12-07 NOTE — Discharge Summary (Signed)
 Physician Discharge Summary   Patient: Renee Gutierrez MRN: [id number] DOB: August 20, 1960  Admit date:     12/04/2023  Discharge date: 12/07/23  Discharge Physician: Alm Charli Liberatore   PCP: Patient, No Pcp Per   Recommendations at discharge:   Please follow up with primary care provider within 1-2 weeks  Please repeat BMP and CBC in one week    Hospital Course: Renee Gutierrez is a 63-year-old female with a history of multiple comorbidities including PUD, GERD, IBS, HTN, CKD, fibromyalgia, migraines, restless leg syndrome.. With recent admission and discharged on 11/29/2023 for intractable nausea vomiting, gastric ulcer-performing EGD on 11/28/2023.SABRASABRACame back again with nausea vomiting, hematemesis.   Reports that her symptoms of abdominal pain nausea vomiting started 3 days ago and progressively getting worse.  In past 24 hours having bloody vomitus.  Has not taken any NSAIDs and not on any blood thinners. Patient had worsening drop in hemoglobin during admission with hemoglobin of 7.6.   ED evaluation: Blood pressure (!) 174/105, pulse (!) 110, temperature 98.4 F (36.9 C), temperature source Oral, resp. rate 19, height 4' 11 (1.499 m), weight 44.5 kg, SpO2 97%. LABs: Potassium 2.4, magnesium  1.7, WBC 17.1, hemoglobin 7.6  CT angio GI bleed: IMPRESSION: 1. 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 No active GI bleeding by CTA. No acute bowel inflammation identified. 2. Small but increased left pleural effusion and associated increased left lung base atelectasis or consolidation.  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 Rest of the examination was normal.    Assessment and  Plan:  Upper GIB (gastrointestinal bleeding)/ Acute blood loss anemia - Hematemesis-acute upper GI bleed with recent history of large gastric ulcer found on EGD on 11/28/2023 -GI consulted -appreciate evaluation recommendations   - IVF initially started prior to EGD - EGD 12/05/2023 - FINDINGS: - 3 cm paraesophageal hernia. Ulcer described below was located in the herniated sac. - Non-bleeding gastric ulcer with a nonbleeding visible vessel (Forrest Class IIa). Treated with bipolar cautery.  - Normal duodenum, stomach. Biopsied.    RECOMMENDATIONS  - Full liquid diet today. Advance diet tomorrow. - Complete 72 hours of IV pantoprazole . - Compazine  10 mg as needed for nausea/vomiting. - Hemoglobin drop from 9>>7.3 -transfused 2 units this admission - Hgb 10.6 on day of d/c  -on the day of d/c, I discussed with GI, Dr. Blas pt for d/c.  Discussed out pt referral to [facility] for evaluation of hernia sac.  Dr. Eartha stated this is non-urgent, and there is no acute/urgent indication for surgery and not appropriate for urgent transfer currently -I discussed with patient and she wanted to go home and currently does not want any transfer.  She states she would prefer to outpatient consultation with surgery   Nausea & vomiting -Improved--diet advanced post procedure and pt tolerated well Intractable nausea vomiting, with hematemesis - Patient received multiple antiemetic IV medication in ED -post EGD, diet gradually advanced which pt tolerated     Acute gastric ulcer with hemorrhage Recent hospitalization and EGD on 11/28/2023, revealing large ulcer in the first portion of the stomach distal to esophagus was thought likely due to NSAID use patient was discharged on PPI twice daily, Carafate 4 times a  day -Hgb 10.6 on day of d/c     Acute post-hemorrhagic anemia - Trend H&H closely, her hemoglobin dropped from 9->>> 7.3 - Transfusing 2U PRBC 12/05/2023 -  H/H stable post transfusion    Hyperlipidemia Continue statins when tolerating p.o.   GERD (gastroesophageal reflux disease) Continue Protonix  40 mg IV twice daily -Carafate p.o. 3 times daily      Hypokalemia - Due to hyperemesis -Replacing both potassium and magnesium       Essential hypertension - Currently stable, anemic anticipating hypotension -restart ACEi, continue normodyne    Esophageal hiatal hernia - Chronic, present on imaging -Patient has presence of ulceration of the cardia in a atypicaln location.  It is very possible this is related to the herniated sac and ischemia related to this  -GI sent referral to [facility]   GAD (generalized anxiety disorder) - Currently stable, continue as needed Xanax as needed IV Ativan or Haldol   Opioid dependent, chronic pain  - Continue with IV analgesics, once tolerating p.o. resuming home regimen - PDMP reviewed  - morphine  IR 15 mg, #60, last fill 11/05/23   Presumed pneumonia  - Recent history per family/patient unable to take or complete PO  antibiotics Due to leukocytosis-will continue empiric IV antibiotic Rocephin -CTA chest neg for PE--small left pleural effusion, atelectasis -pt finiished 3 additional days of ceftriaxone during hospitalization          Consultants: GI Procedures performed: none  Disposition: Home Diet recommendation:  Soft diet DISCHARGE MEDICATION: Allergies as of 12/07/2023       Reactions   Buprenorphine Hcl Swelling   All extremities and neck and face swelled up huge according to patient   Bupropion Swelling, Other (See Comments)   Moody and depressed   Codeine Itching, Nausea And Vomiting, Nausea Only, Swelling   Sulfamethoxazole-trimethoprim Swelling, Other (See Comments)   Swelled up her eye   Nsaids Other (See Comments)   Unknown    Pregabalin  Swelling   Lyrica     Sulfa Antibiotics Other (See Comments)   Unknown    Tapentadol Nausea And Vomiting    Clindamycin Other (See Comments)   Heart burn   Clindamycin/lincomycin Rash, Other (See Comments)   Heart burn   Cymbalta  [duloxetine  Hcl] Swelling   Diclofenac Swelling   Cream    Diclofenac Sodium Swelling   Oral med   Gabapentin Swelling   Oxycodone  Nausea Only   No longer has a reaction per pt   Toradol  [ketorolac  Tromethamine ] Rash        Medication List     STOP taking these medications    HYDROcodone -acetaminophen  5-325 MG tablet Commonly known as: NORCO/VICODIN       TAKE these medications    amLODipine  5 MG tablet Commonly known as: NORVASC  Take 5 mg by mouth daily.   diclofenac Sodium 1 % Gel Commonly known as: VOLTAREN Apply 2 g topically 4 (four) times daily.   DULoxetine  60 MG capsule Commonly known as: CYMBALTA  Take 60 mg by mouth 2 (two) times daily.   erythromycin  ophthalmic ointment Place 1 Application into the left eye 3 (three) times daily.   Gas Relief Extra Strength 125 MG chewable tablet Generic drug: simethicone Chew 125 mg by mouth daily as needed for flatulence.   HYDROmorphone  4 MG tablet Commonly known as: DILAUDID  Take 4 mg by mouth every 4 (four) hours as needed for severe pain (pain score 7-10).   labetalol  200 MG tablet Commonly known as: NORMODYNE  Take 200 mg by  mouth 2 (two) times daily.   lisinopril  40 MG tablet Commonly known as: ZESTRIL  Take 40 mg by mouth daily.   ondansetron  8 MG disintegrating tablet Commonly known as: ZOFRAN -ODT Take 1 tablet (8 mg total) by mouth every 6 (six) hours as needed for nausea or vomiting.   pantoprazole  40 MG tablet Commonly known as: Protonix  Take 1 tablet (40 mg total) by mouth 2 (two) times daily.   potassium chloride  SA 20 MEQ tablet Commonly known as: KLOR-CON  M Take 2 tablets (40 mEq total) by mouth daily. What changed: how much to take   prednisoLONE  acetate 1 % ophthalmic suspension Commonly known as: PRED FORTE  Place 1 drop into the left eye in the morning and at  bedtime.   rOPINIRole  2 MG tablet Commonly known as: REQUIP  Take 4 mg by mouth at bedtime.   sucralfate 1 GM/10ML suspension Commonly known as: CARAFATE Take 10 mLs (1 g total) by mouth 4 (four) times daily -  with meals and at bedtime. What changed: when to take this        Discharge Exam: Filed Weights   12/05/23 1037 12/06/23 0403 12/07/23 0527  Weight: 46 kg 48.1 kg 48.7 kg   HEENT:  [place]/AT, No thrush, no icterus CV:  RRR, no rub, no S3, no S4 Lung:  CTA, no wheeze, no rhonchi Abd:  soft/+BS, mild diffuse tenderness Ext:  No edema, no lymphangitis, no synovitis, no rash   Condition at discharge: stable  The results of significant diagnostics from this hospitalization (including imaging, microbiology, ancillary and laboratory) are listed below for reference.   Imaging Studies: CT ANGIO GI BLEED Result Date: 12/04/2023 EXAM: CTA ABDOMEN AND PELVIS WITH CONTRAST 12/04/2023 09:59:01 AM TECHNIQUE: CTA images of the abdomen and pelvis with intravenous contrast. Three-dimensional MIP/volume rendered formations were performed. Automated exposure control, iterative reconstruction, and/or weight based adjustment of the mA/kV was utilized to reduce the radiation dose to as low as reasonably achievable. COMPARISON: CTA chest and CT abdomen and pelvis 11/25/2023. CLINICAL HISTORY: 63 year old female with abdominal pain, hematemesis, and suspected bleeding ulcers. FINDINGS: VASCULATURE: Stable aortoiliac atherosclerosis. Major arterial structures are patent and stable. Portal venous system appears patent on the delayed images. GI BLEED: No contrast extravasation identified into the stomach or duodenum, or elswhere. ABDOMEN/PELVIS: LOWER CHEST: Small but increased layering left pleural effusion with simple fluid density. Associated increased left lung base atelectasis or consolidation. No pericardial effusion. Right lung base more stable with curvilinear scarring or atelectasis. LIVER: The  liver is unremarkable. GALLBLADDER AND BILE DUCTS: Chronic cholecystectomy. Chronic post cholecystectomy CBD enlargement is stable. SPLEEN: The spleen is unremarkable. PANCREAS: The pancreas is unremarkable. ADRENAL GLANDS: Bilateral adrenal glands demonstrate no acute abnormality. KIDNEYS, URETERS AND BLADDER: Symmetric renal enhancement and early contrast excretion. No stones in the kidneys or ureters. No hydronephrosis. No perinephric or periureteral stranding. Urinary bladder is unremarkable. GI AND BOWEL: Large gastrocaudal hernia redemonstrated. Evidence of chronic fundoplication and herniated appearance of the fundoplication. No definite acute gastroduodenal inflammation. Non dilated large and small bowel loops. There is no bowel obstruction. No abnormal bowel wall thickening or distension. REPRODUCTIVE: Reproductive organs are unremarkable. PERITONEUM AND RETROPERITONEUM: No pneumoperitoneum. No free fluid identified. LYMPH NODES: No lymphadenopathy. BONES AND SOFT TISSUES: Chronic spine degeneration in the setting of advanced levoconvex scoliosis. No acute abnormality of the bones. No acute soft tissue abnormality. IMPRESSION: 1. 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 No active GI bleeding by CTA. No acute bowel inflammation  identified. 2. Small but increased left pleural effusion and associated increased left lung base atelectasis or consolidation. Electronically signed by: Helayne Hurst MD 12/04/2023 10:11 AM EDT RP Workstation: [id number]   DG Abd Acute W/Chest Result Date: 12/01/2023 CLINICAL DATA:  Abdominal pain. EXAM: DG ABDOMEN ACUTE WITH 1 VIEW CHEST COMPARISON:  11/25/2023 FINDINGS: Nonobstructive bowel-gas pattern. No evidence of free air. No radiopaque calculi identified. Pelvic phleboliths. Cholecystectomy clips in the right upper quadrant. Heart size and mediastinal contours are unchanged. Hiatal hernia. Linear scarring/subsegmental atelectasis  again noted at the left lung base. Aortic atherosclerosis. Levoscoliotic curvature of the thoracolumbar spine. IMPRESSION: 1. Nonobstructive bowel-gas pattern. 2. Hiatal hernia. 3. No acute cardiopulmonary findings. Linear scarring/subsegmental atelectasis again noted at the left lung base. Electronically Signed   By: Harrietta Sherry M.D.   On: 12/01/2023 14:56   CT Angio Chest PE W and/or Wo Contrast Result Date: 11/25/2023 CLINICAL DATA:  Pulmonary embolus suspected with high probability. Recent discharge from [facility] for low potassium, pneumonia, and bacterial infection of GI tract. Acute nonlocalized abdominal pain. EXAM: CT ANGIOGRAPHY CHEST CT ABDOMEN AND PELVIS WITH CONTRAST TECHNIQUE: Multidetector CT imaging of the chest was performed using the standard protocol during bolus administration of intravenous contrast. Multiplanar CT image reconstructions and MIPs were obtained to evaluate the vascular anatomy. Multidetector CT imaging of the abdomen and pelvis was performed using the standard protocol during bolus administration of intravenous contrast. RADIATION DOSE REDUCTION: This exam was performed according to the departmental dose-optimization program which includes automated exposure control, adjustment of the mA and/or kV according to patient size and/or use of iterative reconstruction technique. CONTRAST:  OMNIPAQUE  IOHEXOL  350 MG/ML SOLN COMPARISON:  Chest radiograph 11/25/2023. CT abdomen and pelvis 09/20/2023. CT chest 07/05/2011 FINDINGS: CTA CHEST FINDINGS Cardiovascular: Technically adequate study with good opacification of the central and segmental pulmonary arteries. Moderate motion artifact. No focal filling defects. No evidence of significant pulmonary embolus. Cardiac enlargement. Small pericardial effusion. Normal caliber thoracic aorta. No aortic dissection. Great vessel origins are patent. Calcification of the aorta and coronary arteries. Mediastinum/Nodes: Large  esophageal hiatal hernia. Esophagus is decompressed. No significant lymphadenopathy. Thyroid gland is unremarkable. Lungs/Pleura: Trace left pleural effusion. Atelectasis in the left base. No pneumothorax. Musculoskeletal: Postoperative changes in the cervical spine. Degenerative changes in the thoracic spine. No acute bony abnormalities. Review of the MIP images confirms the above findings. CT ABDOMEN and PELVIS FINDINGS Hepatobiliary: No focal liver abnormality is seen. Status post cholecystectomy. No biliary dilatation. Pancreas: Unremarkable. No pancreatic ductal dilatation or surrounding inflammatory changes. Spleen: Normal in size without focal abnormality. Adrenals/Urinary Tract: Adrenal glands are unremarkable. Kidneys are normal, without renal calculi, focal lesion, or hydronephrosis. Bladder is unremarkable. Stomach/Bowel: Stomach, small bowel, and colon are not abnormally distended. Under distention limits evaluation but there is evidence of wall thickening in the rectosigmoid region which likely indicates colitis. This may represent infectious or inflammatory etiologies. No abscess identified. Appendix is not visualized. Vascular/Lymphatic: Aortic atherosclerosis. No enlarged abdominal or pelvic lymph nodes. Reproductive: Status post hysterectomy. No adnexal masses. Other: No free air or free fluid in the abdomen. Abdominal wall musculature appears intact. Edema in the subcutaneous fat. Musculoskeletal: Lumbar scoliosis convex towards the left. Degenerative changes in the lumbar spine. No acute bony abnormalities. Review of the MIP images confirms the above findings. IMPRESSION: 1. No evidence of significant pulmonary embolus. 2. Large esophageal hiatal hernia. 3. Small left pleural effusion with basilar atelectasis. 4. Aortic atherosclerosis. 5. Wall thickening in the rectosigmoid colon  suggesting colitis. No abscess. No proximal obstruction. Electronically Signed   By: Elsie Gravely M.D.   On:  11/25/2023 22:21   CT ABDOMEN PELVIS W CONTRAST Result Date: 11/25/2023 CLINICAL DATA:  Pulmonary embolus suspected with high probability. Recent discharge from [facility] for low potassium, pneumonia, and bacterial infection of GI tract. Acute nonlocalized abdominal pain. EXAM: CT ANGIOGRAPHY CHEST CT ABDOMEN AND PELVIS WITH CONTRAST TECHNIQUE: Multidetector CT imaging of the chest was performed using the standard protocol during bolus administration of intravenous contrast. Multiplanar CT image reconstructions and MIPs were obtained to evaluate the vascular anatomy. Multidetector CT imaging of the abdomen and pelvis was performed using the standard protocol during bolus administration of intravenous contrast. RADIATION DOSE REDUCTION: This exam was performed according to the departmental dose-optimization program which includes automated exposure control, adjustment of the mA and/or kV according to patient size and/or use of iterative reconstruction technique. CONTRAST:  OMNIPAQUE  IOHEXOL  350 MG/ML SOLN COMPARISON:  Chest radiograph 11/25/2023. CT abdomen and pelvis 09/20/2023. CT chest 07/05/2011 FINDINGS: CTA CHEST FINDINGS Cardiovascular: Technically adequate study with good opacification of the central and segmental pulmonary arteries. Moderate motion artifact. No focal filling defects. No evidence of significant pulmonary embolus. Cardiac enlargement. Small pericardial effusion. Normal caliber thoracic aorta. No aortic dissection. Great vessel origins are patent. Calcification of the aorta and coronary arteries. Mediastinum/Nodes: Large esophageal hiatal hernia. Esophagus is decompressed. No significant lymphadenopathy. Thyroid gland is unremarkable. Lungs/Pleura: Trace left pleural effusion. Atelectasis in the left base. No pneumothorax. Musculoskeletal: Postoperative changes in the cervical spine. Degenerative changes in the thoracic spine. No acute bony abnormalities. Review of the MIP  images confirms the above findings. CT ABDOMEN and PELVIS FINDINGS Hepatobiliary: No focal liver abnormality is seen. Status post cholecystectomy. No biliary dilatation. Pancreas: Unremarkable. No pancreatic ductal dilatation or surrounding inflammatory changes. Spleen: Normal in size without focal abnormality. Adrenals/Urinary Tract: Adrenal glands are unremarkable. Kidneys are normal, without renal calculi, focal lesion, or hydronephrosis. Bladder is unremarkable. Stomach/Bowel: Stomach, small bowel, and colon are not abnormally distended. Under distention limits evaluation but there is evidence of wall thickening in the rectosigmoid region which likely indicates colitis. This may represent infectious or inflammatory etiologies. No abscess identified. Appendix is not visualized. Vascular/Lymphatic: Aortic atherosclerosis. No enlarged abdominal or pelvic lymph nodes. Reproductive: Status post hysterectomy. No adnexal masses. Other: No free air or free fluid in the abdomen. Abdominal wall musculature appears intact. Edema in the subcutaneous fat. Musculoskeletal: Lumbar scoliosis convex towards the left. Degenerative changes in the lumbar spine. No acute bony abnormalities. Review of the MIP images confirms the above findings. IMPRESSION: 1. No evidence of significant pulmonary embolus. 2. Large esophageal hiatal hernia. 3. Small left pleural effusion with basilar atelectasis. 4. Aortic atherosclerosis. 5. Wall thickening in the rectosigmoid colon suggesting colitis. No abscess. No proximal obstruction. Electronically Signed   By: Elsie Gravely M.D.   On: 11/25/2023 22:21   DG Chest Port 1 View Result Date: 11/25/2023 EXAM: 1 VIEW(S) XRAY OF THE CHEST 11/25/2023 08:15:00 PM COMPARISON: 07/06/2023 CLINICAL HISTORY: Questionable sepsis - evaluate for abnormality. FINDINGS: LUNGS AND PLEURA: No focal pulmonary opacity. No pulmonary edema. No pleural effusion. No pneumothorax. HEART AND MEDIASTINUM: Normal heart  size and mediastinal contours. Atherosclerotic calcifications. BONES AND SOFT TISSUES: Cervical fusion hardware is noted. No acute osseous abnormality. DIAPHRAGM AND UPPER ABDOMEN: A small hiatal hernia is present. IMPRESSION: 1. No acute abnormalities. Electronically signed by: Andrea Gasman MD 11/25/2023 08:25 PM EDT RP Workstation: [id number]  Microbiology: Results for orders placed or performed during the hospital encounter of 11/25/23  Resp panel by RT-PCR (RSV, Flu A&B, Covid) Anterior Nasal Swab     Status: None   Collection Time: 11/25/23  8:07 PM   Specimen: Anterior Nasal Swab  Result Value Ref Range Status   SARS Coronavirus 2 by RT PCR NEGATIVE NEGATIVE Final    Comment: (NOTE) SARS-CoV-2 target nucleic acids are NOT DETECTED.  The SARS-CoV-2 RNA is generally detectable in upper respiratory specimens during the acute phase of infection. The lowest concentration of SARS-CoV-2 viral copies this assay can detect is 138 copies/mL. A negative result does not preclude SARS-Cov-2 infection and should not be used as the sole basis for treatment or other patient management decisions. A negative result may occur with  improper specimen collection/handling, submission of specimen other than nasopharyngeal swab, presence of viral mutation(s) within the areas targeted by this assay, and inadequate number of viral copies(<138 copies/mL). A negative result must be combined with clinical observations, patient history, and epidemiological information. The expected result is Negative.  Fact Sheet for Patients:  BloggerCourse.com  Fact Sheet for Healthcare Providers:  SeriousBroker.it  This test is no t yet approved or cleared by the [place]  FDA and  has been authorized for detection and/or diagnosis of SARS-CoV-2 by FDA under an Emergency Use Authorization (EUA). This EUA will remain  in effect (meaning this test can be used) for  the duration of the COVID-19 declaration under Section 564(b)(1) of the Act, 21 U.S.C.section 360bbb-3(b)(1), unless the authorization is terminated  or revoked sooner.       Influenza A by PCR NEGATIVE NEGATIVE Final   Influenza B by PCR NEGATIVE NEGATIVE Final    Comment: (NOTE) The Xpert Xpress SARS-CoV-2/FLU/RSV plus assay is intended as an aid in the diagnosis of influenza from Nasopharyngeal swab specimens and should not be used as a sole basis for treatment. Nasal washings and aspirates are unacceptable for Xpert Xpress SARS-CoV-2/FLU/RSV testing.  Fact Sheet for Patients: BloggerCourse.com  Fact Sheet for Healthcare Providers: SeriousBroker.it  This test is not yet approved or cleared by the [place]  FDA and has been authorized for detection and/or diagnosis of SARS-CoV-2 by FDA under an Emergency Use Authorization (EUA). This EUA will remain in effect (meaning this test can be used) for the duration of the COVID-19 declaration under Section 564(b)(1) of the Act, 21 U.S.C. section 360bbb-3(b)(1), unless the authorization is terminated or revoked.     Resp Syncytial Virus by PCR NEGATIVE NEGATIVE Final    Comment: (NOTE) Fact Sheet for Patients: BloggerCourse.com  Fact Sheet for Healthcare Providers: SeriousBroker.it  This test is not yet approved or cleared by the [place]  FDA and has been authorized for detection and/or diagnosis of SARS-CoV-2 by FDA under an Emergency Use Authorization (EUA). This EUA will remain in effect (meaning this test can be used) for the duration of the COVID-19 declaration under Section 564(b)(1) of the Act, 21 U.S.C. section 360bbb-3(b)(1), unless the authorization is terminated or revoked.  Performed at [facility], [address], [place], [place] [address]   Blood Culture (routine x 2)     Status: None   Collection Time:  11/25/23  8:08 PM   Specimen: BLOOD  Result Value Ref Range Status   Specimen Description BLOOD LEFT ANTECUBITAL  Final   Special Requests   Final    BOTTLES DRAWN AEROBIC AND ANAEROBIC Blood Culture adequate volume   Culture   Final    NO  GROWTH 5 DAYS Performed at [facility], [address], [place], [place] [address]    Report Status 11/30/2023 FINAL  Final  Blood Culture (routine x 2)     Status: None   Collection Time: 11/25/23  9:34 PM   Specimen: BLOOD  Result Value Ref Range Status   Specimen Description BLOOD RIGHT ANTECUBITAL  Final   Special Requests   Final    BOTTLES DRAWN AEROBIC AND ANAEROBIC Blood Culture adequate volume   Culture   Final    NO GROWTH 5 DAYS Performed at [facility], [address] [place], [place] [address]    Report Status 11/30/2023 FINAL  Final    Labs: CBC: Recent Labs  Lab 12/01/23 1413 12/04/23 0805 12/04/23 1423 12/05/23 2107 12/06/23 0345 12/06/23 0858 12/06/23 1414 12/07/23 0429  WBC 19.5* 17.1*  --   --   --   --   --  6.4  NEUTROABS 15.7* 13.5*  --   --   --   --   --   --   HGB 9.0* 7.6*   < > 11.0* 11.4* 11.2* 10.5* 10.6*  HCT 30.0* 24.1*   < > 33.5* 34.9* 34.6* 32.0* 33.1*  MCV 89.6 86.1  --   --   --   --   --  87.3  PLT 864* 743*  --   --   --   --   --  543*   < > = values in this interval not displayed.   Basic Metabolic Panel: Recent Labs  Lab 12/01/23 1413 12/04/23 0805 12/04/23 1534 12/05/23 0309 12/06/23 0345 12/07/23 0429  NA 142 142 140 140 137 140  K 3.1* 2.4* 2.7* 3.2* 3.2* 3.7  CL 108 107 105 106 104 108  CO2 21* 22 19* 21* 21* 23  GLUCOSE 102* 111* 86 124* 122* 103*  BUN 12 <5* <5* <5* 5* 6*  CREATININE 1.02* 0.59 0.56 0.69 0.59 0.55  CALCIUM  9.5 8.8* 8.8* 8.7* 9.2 8.9  MG 2.0 1.7  --   --   --  2.0  PHOS  --  2.3*  --   --   --   --    Liver Function Tests: Recent Labs  Lab 12/01/23 1413 12/04/23 0805  AST 13* 12*  ALT 7 7  ALKPHOS 84 64  BILITOT <0.2 <0.2  PROT 5.7* 5.7*   ALBUMIN 3.3* 3.1*   CBG: Recent Labs  Lab 12/05/23 0740 12/06/23 0719 12/07/23 0724  GLUCAP 129* 115* 106*    Discharge time spent: greater than 30 minutes.  Signed: Alm Schneider, MD [facility] 12/07/2023

## 2023-12-08 ENCOUNTER — Ambulatory Visit (INDEPENDENT_AMBULATORY_CARE_PROVIDER_SITE_OTHER): Payer: Self-pay | Admitting: Gastroenterology

## 2023-12-08 LAB — SURGICAL PATHOLOGY

## 2023-12-08 NOTE — Addendum Note (Signed)
 Addended by: JEANELL GRAEME RAMAN on: 12/08/2023 08:00 AM   Modules accepted: Orders

## 2023-12-08 NOTE — Telephone Encounter (Signed)
Referral sent to [facility] 

## 2023-12-08 NOTE — Telephone Encounter (Signed)
 Lmom for pt to return call

## 2023-12-08 NOTE — Anesthesia Postprocedure Evaluation (Signed)
 Anesthesia Post Note  Patient: Renee Gutierrez  Procedure(s) Performed: EGD (ESOPHAGOGASTRODUODENOSCOPY)  Patient location during evaluation: Phase II Anesthesia Type: General Level of consciousness: awake Pain management: pain level controlled Vital Signs Assessment: post-procedure vital signs reviewed and stable Respiratory status: spontaneous breathing and respiratory function stable Cardiovascular status: blood pressure returned to baseline and stable Postop Assessment: no headache and no apparent nausea or vomiting Anesthetic complications: no Comments: Late entry   No notable events documented.   Last Vitals:  Vitals:   12/07/23 0818 12/07/23 0926  BP: (!) 181/104 (!) 170/91  Pulse: 84   Resp:    Temp:    SpO2:      Last Pain:  Vitals:   12/07/23 0910  TempSrc:   PainSc: 9                  [name]

## 2023-12-09 ENCOUNTER — Encounter (HOSPITAL_COMMUNITY): Payer: Self-pay | Admitting: Gastroenterology

## 2023-12-09 ENCOUNTER — Other Ambulatory Visit: Payer: Self-pay

## 2023-12-09 DIAGNOSIS — K250 Acute gastric ulcer with hemorrhage: Secondary | ICD-10-CM

## 2023-12-09 NOTE — Telephone Encounter (Signed)
 Lmom for pt to return call

## 2023-12-09 NOTE — Telephone Encounter (Signed)
 Lab was ordered and pt's daughter was made aware. Transferred to the front to schedule appt.

## 2023-12-10 NOTE — Progress Notes (Signed)
 3 mth EGD noted in recall Patient result letter mailed procedure note and pathology result faxed to PCP

## 2023-12-12 ENCOUNTER — Encounter (HOSPITAL_COMMUNITY): Payer: Self-pay | Admitting: Emergency Medicine

## 2023-12-12 ENCOUNTER — Observation Stay (HOSPITAL_COMMUNITY)

## 2023-12-12 ENCOUNTER — Emergency Department (HOSPITAL_COMMUNITY)

## 2023-12-12 ENCOUNTER — Inpatient Hospital Stay (HOSPITAL_COMMUNITY)
Admission: EM | Admit: 2023-12-12 | Discharge: 2023-12-14 | DRG: 640 | Disposition: A | Discharge status: Home / Self Care | Attending: Internal Medicine | Admitting: Internal Medicine

## 2023-12-12 ENCOUNTER — Other Ambulatory Visit: Payer: Self-pay

## 2023-12-12 DIAGNOSIS — Z886 Allergy status to analgesic agent status: Secondary | ICD-10-CM

## 2023-12-12 DIAGNOSIS — Z808 Family history of malignant neoplasm of other organs or systems: Secondary | ICD-10-CM

## 2023-12-12 DIAGNOSIS — I5031 Acute diastolic (congestive) heart failure: Secondary | ICD-10-CM

## 2023-12-12 DIAGNOSIS — K279 Peptic ulcer, site unspecified, unspecified as acute or chronic, without hemorrhage or perforation: Secondary | ICD-10-CM

## 2023-12-12 DIAGNOSIS — I5033 Acute on chronic diastolic (congestive) heart failure: Secondary | ICD-10-CM | POA: Diagnosis present

## 2023-12-12 DIAGNOSIS — N182 Chronic kidney disease, stage 2 (mild): Secondary | ICD-10-CM | POA: Diagnosis present

## 2023-12-12 DIAGNOSIS — Z9049 Acquired absence of other specified parts of digestive tract: Secondary | ICD-10-CM

## 2023-12-12 DIAGNOSIS — E876 Hypokalemia: Principal | ICD-10-CM | POA: Diagnosis present

## 2023-12-12 DIAGNOSIS — Z888 Allergy status to other drugs, medicaments and biological substances status: Secondary | ICD-10-CM

## 2023-12-12 DIAGNOSIS — M797 Fibromyalgia: Secondary | ICD-10-CM | POA: Diagnosis present

## 2023-12-12 DIAGNOSIS — Z881 Allergy status to other antibiotic agents status: Secondary | ICD-10-CM

## 2023-12-12 DIAGNOSIS — Z90710 Acquired absence of both cervix and uterus: Secondary | ICD-10-CM

## 2023-12-12 DIAGNOSIS — Z800 Family history of malignant neoplasm of digestive organs: Secondary | ICD-10-CM

## 2023-12-12 DIAGNOSIS — Z885 Allergy status to narcotic agent status: Secondary | ICD-10-CM

## 2023-12-12 DIAGNOSIS — K219 Gastro-esophageal reflux disease without esophagitis: Secondary | ICD-10-CM | POA: Diagnosis present

## 2023-12-12 DIAGNOSIS — Z882 Allergy status to sulfonamides status: Secondary | ICD-10-CM

## 2023-12-12 DIAGNOSIS — I3139 Other pericardial effusion (noninflammatory): Secondary | ICD-10-CM | POA: Diagnosis present

## 2023-12-12 DIAGNOSIS — F419 Anxiety disorder, unspecified: Secondary | ICD-10-CM | POA: Diagnosis present

## 2023-12-12 DIAGNOSIS — G2581 Restless legs syndrome: Secondary | ICD-10-CM | POA: Diagnosis present

## 2023-12-12 DIAGNOSIS — E785 Hyperlipidemia, unspecified: Secondary | ICD-10-CM | POA: Diagnosis present

## 2023-12-12 DIAGNOSIS — R600 Localized edema: Secondary | ICD-10-CM | POA: Diagnosis not present

## 2023-12-12 DIAGNOSIS — G894 Chronic pain syndrome: Secondary | ICD-10-CM | POA: Diagnosis present

## 2023-12-12 DIAGNOSIS — Z79899 Other long term (current) drug therapy: Secondary | ICD-10-CM

## 2023-12-12 DIAGNOSIS — I10 Essential (primary) hypertension: Secondary | ICD-10-CM | POA: Diagnosis present

## 2023-12-12 DIAGNOSIS — K257 Chronic gastric ulcer without hemorrhage or perforation: Secondary | ICD-10-CM | POA: Diagnosis present

## 2023-12-12 DIAGNOSIS — Z87891 Personal history of nicotine dependence: Secondary | ICD-10-CM

## 2023-12-12 DIAGNOSIS — Z8052 Family history of malignant neoplasm of bladder: Secondary | ICD-10-CM

## 2023-12-12 DIAGNOSIS — I130 Hypertensive heart and chronic kidney disease with heart failure and stage 1 through stage 4 chronic kidney disease, or unspecified chronic kidney disease: Secondary | ICD-10-CM | POA: Diagnosis present

## 2023-12-12 LAB — BASIC METABOLIC PANEL WITH GFR
Anion gap: 13 (ref 5–15)
BUN: 5 mg/dL — ABNORMAL LOW (ref 8–23)
CO2: 28 mmol/L (ref 22–32)
Calcium: 9.3 mg/dL (ref 8.9–10.3)
Chloride: 105 mmol/L (ref 98–111)
Creatinine, Ser: 0.51 mg/dL (ref 0.44–1.00)
GFR, Estimated: >60 mL/min (ref >60)
Glucose, Bld: 97 mg/dL (ref 70–99)
Potassium: 2.7 mmol/L — CL (ref 3.5–5.1)
Sodium: 146 mmol/L — ABNORMAL HIGH (ref 135–145)

## 2023-12-12 LAB — ECHOCARDIOGRAM COMPLETE
Area-P 1/2: 5.46 cm2
Est EF: >75
Height: 59 in
S' Lateral: 2 cm
Weight: 1648 [oz_av]

## 2023-12-12 LAB — CBC WITH DIFFERENTIAL/PLATELET
Abs Immature Granulocytes: 0.1 K/uL — ABNORMAL HIGH (ref 0.00–0.07)
Basophils Absolute: 0.1 K/uL (ref 0.0–0.1)
Basophils Relative: 1 %
Eosinophils Absolute: 0.1 K/uL (ref 0.0–0.5)
Eosinophils Relative: 1 %
HCT: 38 % (ref 36.0–46.0)
Hemoglobin: 12.1 g/dL (ref 12.0–15.0)
Immature Granulocytes: 1 %
Lymphocytes Relative: 22 %
Lymphs Abs: 2.4 K/uL (ref 0.7–4.0)
MCH: 27.4 pg (ref 26.0–34.0)
MCHC: 31.8 g/dL (ref 30.0–36.0)
MCV: 86 fL (ref 80.0–100.0)
Monocytes Absolute: 0.7 K/uL (ref 0.1–1.0)
Monocytes Relative: 7 %
Neutro Abs: 7.5 K/uL (ref 1.7–7.7)
Neutrophils Relative %: 68 %
Platelets: 426 K/uL — ABNORMAL HIGH (ref 150–400)
RBC: 4.42 MIL/uL (ref 3.87–5.11)
RDW: 14.7 % (ref 11.5–15.5)
WBC: 11 K/uL — ABNORMAL HIGH (ref 4.0–10.5)
nRBC: 0 % (ref 0.0–0.2)

## 2023-12-12 LAB — PHOSPHORUS
Phosphorus: 1.5 mg/dL — ABNORMAL LOW (ref 2.5–4.6)
Phosphorus: 2.6 mg/dL (ref 2.5–4.6)

## 2023-12-12 LAB — TROPONIN T, HIGH SENSITIVITY
Troponin T High Sensitivity: 17 ng/L (ref 0–19)
Troponin T High Sensitivity: 15 ng/L (ref 0–19)

## 2023-12-12 LAB — MAGNESIUM: Magnesium: 1.7 mg/dL (ref 1.7–2.4)

## 2023-12-12 LAB — TSH: TSH: 2.32 u[IU]/mL (ref 0.350–4.500)

## 2023-12-12 LAB — PRO BRAIN NATRIURETIC PEPTIDE: Pro Brain Natriuretic Peptide: 355 pg/mL — ABNORMAL HIGH (ref <300.0)

## 2023-12-12 MED ORDER — POTASSIUM & SODIUM PHOSPHATES 280-160-250 MG PO PACK
2.0000 | PACK | ORAL | Status: AC
  Administered 2023-12-12 (×3): 2 via ORAL
  Filled 2023-12-12 (×3): qty 1

## 2023-12-12 MED ORDER — SODIUM CHLORIDE 0.9% FLUSH
3.0000 mL | Freq: Two times a day (BID) | INTRAVENOUS | Status: DC
  Administered 2023-12-12 – 2023-12-14 (×5): 3 mL via INTRAVENOUS

## 2023-12-12 MED ORDER — MORPHINE SULFATE 15 MG PO TABS
15.0000 mg | ORAL_TABLET | Freq: Four times a day (QID) | ORAL | Status: DC | PRN
Start: 2023-12-12 — End: 2023-12-13
  Administered 2023-12-12 – 2023-12-13 (×3): 15 mg via ORAL
  Filled 2023-12-12 (×3): qty 1

## 2023-12-12 MED ORDER — LISINOPRIL 10 MG PO TABS: 40.0000 mg | ORAL_TABLET | Freq: Every day | ORAL | Status: DC

## 2023-12-12 MED ORDER — FUROSEMIDE 10 MG/ML IJ SOLN
40.0000 mg | Freq: Two times a day (BID) | INTRAMUSCULAR | Status: DC
  Administered 2023-12-12 – 2023-12-13 (×3): 40 mg via INTRAVENOUS
  Filled 2023-12-12 (×3): qty 4

## 2023-12-12 MED ORDER — METHOCARBAMOL 1000 MG/10ML IJ SOLN: 500.0000 mg | Freq: Three times a day (TID) | INTRAMUSCULAR | Status: DC | PRN

## 2023-12-12 MED ORDER — SODIUM CHLORIDE 0.9 % IV SOLN: 250.0000 mL | INTRAVENOUS | Status: AC | PRN

## 2023-12-12 MED ORDER — PREDNISOLONE ACETATE 1 % OP SUSP
1.0000 [drp] | Freq: Two times a day (BID) | OPHTHALMIC | Status: DC
  Administered 2023-12-12 – 2023-12-14 (×3): 1 [drp] via OPHTHALMIC
  Filled 2023-12-12: qty 1

## 2023-12-12 MED ORDER — LABETALOL HCL 5 MG/ML IV SOLN
20.0000 mg | Freq: Once | INTRAVENOUS | Status: AC
  Administered 2023-12-12: 20 mg via INTRAVENOUS
  Filled 2023-12-12: qty 4

## 2023-12-12 MED ORDER — SODIUM CHLORIDE 0.9% FLUSH: 3.0000 mL | INTRAVENOUS | Status: DC | PRN

## 2023-12-12 MED ORDER — ACETAMINOPHEN 325 MG PO TABS: 650.0000 mg | ORAL_TABLET | Freq: Four times a day (QID) | ORAL | Status: DC | PRN

## 2023-12-12 MED ORDER — DULOXETINE HCL 60 MG PO CPEP
60.0000 mg | ORAL_CAPSULE | Freq: Two times a day (BID) | ORAL | Status: DC
  Administered 2023-12-12 – 2023-12-14 (×5): 60 mg via ORAL
  Filled 2023-12-12: qty 1
  Filled 2023-12-12: qty 2
  Filled 2023-12-12 (×3): qty 1

## 2023-12-12 MED ORDER — POTASSIUM CHLORIDE 10 MEQ/100ML IV SOLN
10.0000 meq | INTRAVENOUS | Status: DC
  Administered 2023-12-12: 10 meq via INTRAVENOUS
  Filled 2023-12-12: qty 100

## 2023-12-12 MED ORDER — SODIUM CHLORIDE 0.9 % IV SOLN: Freq: Once | INTRAVENOUS | Status: AC

## 2023-12-12 MED ORDER — LABETALOL HCL 200 MG PO TABS
200.0000 mg | ORAL_TABLET | Freq: Two times a day (BID) | ORAL | Status: DC
  Administered 2023-12-12 – 2023-12-14 (×5): 200 mg via ORAL
  Filled 2023-12-12 (×5): qty 1

## 2023-12-12 MED ORDER — PROCHLORPERAZINE EDISYLATE 10 MG/2ML IJ SOLN
10.0000 mg | Freq: Four times a day (QID) | INTRAMUSCULAR | Status: DC | PRN
  Administered 2023-12-13: 10 mg via INTRAVENOUS
  Filled 2023-12-12: qty 2

## 2023-12-12 MED ORDER — HYDROMORPHONE HCL 1 MG/ML IJ SOLN
1.0000 mg | Freq: Once | INTRAMUSCULAR | Status: AC
  Administered 2023-12-12: 1 mg via INTRAVENOUS
  Filled 2023-12-12: qty 1

## 2023-12-12 MED ORDER — AMLODIPINE BESYLATE 5 MG PO TABS
10.0000 mg | ORAL_TABLET | Freq: Every day | ORAL | Status: DC
  Administered 2023-12-12 – 2023-12-14 (×3): 10 mg via ORAL
  Filled 2023-12-12 (×3): qty 2

## 2023-12-12 MED ORDER — ACETAMINOPHEN 650 MG RE SUPP: 650.0000 mg | Freq: Four times a day (QID) | RECTAL | Status: DC | PRN

## 2023-12-12 MED ORDER — ROPINIROLE HCL 1 MG PO TABS
4.0000 mg | ORAL_TABLET | Freq: Every day | ORAL | Status: DC
  Administered 2023-12-12 – 2023-12-13 (×2): 4 mg via ORAL
  Filled 2023-12-12 (×2): qty 4

## 2023-12-12 MED ORDER — HYDRALAZINE HCL 20 MG/ML IJ SOLN
10.0000 mg | Freq: Four times a day (QID) | INTRAMUSCULAR | Status: DC | PRN
  Administered 2023-12-12: 10 mg via INTRAVENOUS
  Filled 2023-12-12: qty 1

## 2023-12-12 MED ORDER — MORPHINE SULFATE (PF) 4 MG/ML IV SOLN
4.0000 mg | Freq: Once | INTRAVENOUS | Status: AC
  Administered 2023-12-12: 4 mg via INTRAVENOUS
  Filled 2023-12-12: qty 1

## 2023-12-12 MED ORDER — FUROSEMIDE 10 MG/ML IJ SOLN
20.0000 mg | Freq: Once | INTRAMUSCULAR | Status: AC
  Administered 2023-12-12: 20 mg via INTRAVENOUS
  Filled 2023-12-12: qty 2

## 2023-12-12 MED ORDER — PANTOPRAZOLE SODIUM 40 MG PO TBEC
40.0000 mg | DELAYED_RELEASE_TABLET | Freq: Two times a day (BID) | ORAL | Status: DC
  Administered 2023-12-12 – 2023-12-14 (×5): 40 mg via ORAL
  Filled 2023-12-12 (×5): qty 1

## 2023-12-12 MED ORDER — POTASSIUM CHLORIDE CRYS ER 20 MEQ PO TBCR
40.0000 meq | EXTENDED_RELEASE_TABLET | ORAL | Status: AC
  Administered 2023-12-12 (×3): 40 meq via ORAL
  Filled 2023-12-12 (×3): qty 2

## 2023-12-12 MED ORDER — SUCRALFATE 1 GM/10ML PO SUSP
1.0000 g | Freq: Three times a day (TID) | ORAL | Status: DC
  Administered 2023-12-12 – 2023-12-14 (×10): 1 g via ORAL
  Filled 2023-12-12 (×9): qty 10

## 2023-12-12 NOTE — ED Provider Notes (Signed)
 [place] EMERGENCY DEPARTMENT AT [facility]  Provider Note  CSN: [id number] Arrival date & time: 12/12/23 9647  History Chief Complaint  Patient presents with   Chest Pain   Foot Swelling    Renee Gutierrez is a 63 y.o. female with history of chronic pain, GERD, hiatal hernia s/p [employer] was recently admitted x 2 for PUD and bleeding ulcer, got IVF and transfusion at most recent admission, discharged 10/19. Here tonight for pain and swelling in BLE. She has not had SOB, reported chest pain to triage but states that is not new and not her primary concern. She has HTN and reports compliance with medications.    Home Medications Prior to Admission medications   Medication Sig Start Date End Date Taking? Authorizing Provider  amLODipine  (NORVASC ) 5 MG tablet Take 5 mg by mouth daily. 11/24/23   [provider]  diclofenac Sodium (VOLTAREN) 1 % GEL Apply 2 g topically 4 (four) times daily. 04/18/20   [provider]  DULoxetine  (CYMBALTA ) 60 MG capsule Take 60 mg by mouth 2 (two) times daily. 05/16/20   [provider]  erythromycin  ophthalmic ointment Place 1 Application into the left eye 3 (three) times daily.    [provider]  HYDROmorphone  (DILAUDID ) 4 MG tablet Take 4 mg by mouth every 4 (four) hours as needed for severe pain (pain score 7-10).    [provider]  labetalol  (NORMODYNE ) 200 MG tablet Take 200 mg by mouth 2 (two) times daily.    [provider]  lisinopril  (PRINIVIL ,ZESTRIL ) 40 MG tablet Take 40 mg by mouth daily. 01/31/14 12/04/23  [provider]  ondansetron  (ZOFRAN -ODT) 8 MG disintegrating tablet Take 1 tablet (8 mg total) by mouth every 6 (six) hours as needed for nausea or vomiting. 11/29/23   Darci Pore, MD  pantoprazole  (PROTONIX ) 40 MG tablet Take 1 tablet (40 mg total) by mouth 2 (two) times daily. 11/29/23   Darci Pore, MD  potassium chloride  SA (KLOR-CON  M) 20 MEQ  tablet Take 2 tablets (40 mEq total) by mouth daily. Patient taking differently: Take 20 mEq by mouth daily. 11/29/23   Darci Pore, MD  prednisoLONE  acetate (PRED FORTE ) 1 % ophthalmic suspension Place 1 drop into the left eye in the morning and at bedtime. 03/01/20   [provider]  rOPINIRole  (REQUIP ) 2 MG tablet Take 4 mg by mouth at bedtime. 11/11/14   [provider]  simethicone (GAS RELIEF EXTRA STRENGTH) 125 MG chewable tablet Chew 125 mg by mouth daily as needed for flatulence.  05/24/14   [provider]  sucralfate (CARAFATE) 1 GM/10ML suspension Take 10 mLs (1 g total) by mouth 4 (four) times daily -  with meals and at bedtime. 12/07/23   Evonnie Lenis, MD     Allergies    Buprenorphine hcl, Bupropion, Codeine, Sulfamethoxazole-trimethoprim, Nsaids, Pregabalin , Sulfa antibiotics, Tapentadol, Clindamycin, Clindamycin/lincomycin, Cymbalta  [duloxetine  hcl], Diclofenac, Diclofenac sodium, Gabapentin, Oxycodone , and Toradol  [ketorolac  tromethamine ]   Review of Systems   Review of Systems Please see HPI for pertinent positives and negatives  Physical Exam BP (!) 203/94   Pulse 73   Temp 99.3 F (37.4 C) (Oral)   Resp 17   Ht 4' 11 (1.499 m)   Wt 46.7 kg   SpO2 99%   BMI 20.80 kg/m   Physical Exam Vitals and nursing note reviewed.  Constitutional:      Appearance: Normal appearance.  HENT:     Head: Normocephalic and atraumatic.  Nose: Nose normal.     Mouth/Throat:     Mouth: Mucous membranes are moist.  Eyes:     Extraocular Movements: Extraocular movements intact.     Conjunctiva/sclera: Conjunctivae normal.  Cardiovascular:     Rate and Rhythm: Normal rate.  Pulmonary:     Effort: Pulmonary effort is normal.     Breath sounds: Normal breath sounds.  Abdominal:     General: Abdomen is flat.     Palpations: Abdomen is soft.     Tenderness: There is abdominal tenderness. There is no guarding.  Musculoskeletal:         General: No swelling. Normal range of motion.     Cervical back: Neck supple.     Right lower leg: Edema present.     Left lower leg: Edema present.  Skin:    General: Skin is warm and dry.  Neurological:     General: No focal deficit present.     Mental Status: She is alert.  Psychiatric:        Mood and Affect: Mood normal.     ED Results / Procedures / Treatments   EKG EKG Interpretation Date/Time:  Friday December 12 2023 04:15:35 EDT Ventricular Rate:  99 PR Interval:  147 QRS Duration:  92 QT Interval:  372 QTC Calculation: 478 R Axis:   54  Text Interpretation: Sinus rhythm Probable left atrial enlargement Probable anteroseptal infarct, old No significant change since last tracing Confirmed by Roselyn Dunnings [phone]) on 12/12/2023 4:38:59 AM  Procedures Procedures  Medications Ordered in the ED Medications  potassium chloride  10 mEq in 100 mL IVPB ( Intravenous Rate/Dose Change 12/12/23 0602)  morphine  (PF) 4 MG/ML injection 4 mg (4 mg Intravenous Given 12/12/23 0501)  labetalol  (NORMODYNE ) injection 20 mg (20 mg Intravenous Given 12/12/23 0539)  furosemide (LASIX) injection 20 mg (20 mg Intravenous Given 12/12/23 0554)    Initial Impression and Plan  Patient here with BLE edema and pain. Recent admission for GIB with transfusion, suspect she has gotten fluid overloaded. Less like DVT or CHF. Will check labs, CXR. Pain meds for comfort.  ED Course   Clinical Course as of 12/12/23 0612  Fri Dec 12, 2023  0519 BMP with hypokalemia, otherwise unremarkable. Concern for oral repletion given recent PUD and with likely need to have some diuresis for fluid overload, may drive it further down. Will begin IV repletion and plan admission for monitoring. Trop is normal. BNP is mildly elevated.  [CS]  0523 CBC with continued improvement in Hgb since recent transfusion. Lasix ordered for gentle diuresis. Hospitalist paged for admission.  [CS]  0601 I personally viewed the  images from radiology studies and agree with radiologist interpretation: CXR without pulm edema [CS]  0606 Spoke with Dr. Lawence, who will accept for admission.  [CS]    Clinical Course User Index [CS] Roselyn Dunnings NOVAK, MD     MDM Rules/Calculators/A&P Medical Decision Making Problems Addressed: Hypokalemia: acute illness or injury Peripheral edema: acute illness or injury  Amount and/or Complexity of Data Reviewed Labs: ordered. Decision-making details documented in ED Course. Radiology: ordered and independent interpretation performed. Decision-making details documented in ED Course. ECG/medicine tests: ordered and independent interpretation performed. Decision-making details documented in ED Course.  Risk Prescription drug management. Parenteral controlled substances. Decision regarding hospitalization.     Final Clinical Impression(s) / ED Diagnoses Final diagnoses:  Peripheral edema  Hypokalemia    Rx / DC Orders ED Discharge Orders     None  Roselyn Carlin NOVAK, MD 12/12/23 [phone]

## 2023-12-12 NOTE — H&P (Addendum)
 History and Physical    Renee BUREN FMW:[id number] DOB: 1960-08-22 DOA: 12/12/2023  DOS: the patient was seen and examined on 12/12/2023  PCP: Patient, No Pcp Per   Patient coming from: Home  I have personally briefly reviewed patient's old medical records in [facility] Link and CareEverywhere  HPI:  Renee Gutierrez is a 63 y.o. 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 Of note she was recently admitted twice this month. First Admission for hypokalemia and colitis and was found to have a large ulcer distal to her esophagus during EGD.  The second admission was for hematemesis from gastric ulcer with hemorrhage.  She presents to [facility], ED with ports of bilateral lower extremities edema that began 4 days ago.  During her recent admission she was transfused with PRBCs and received IVF and there is concern patient is fluid volume overloaded from those interventions.  She denies dyspnea on exertion and palpitations.  She endorses fatigue, dull chest/epigastric pain that has been ongoing and was addressed last hospitalization by gastroenterologist Dr Selwyn Patient has presence of ulceration of the cardia in a atypicaln location.  It is very possible this is related to the herniated sac and ischemia related to this.  I discussed with the patient that she will benefit from evaluation by foregut surgeon for possible hiatal hernia repair at [facility] surgery as this may allow better healing of the ulceration.   ED Course: On arrival to the [facility] ED patient was noted to be afebrile temp 37.4 C, BP 223/103, HR 96, RR 18, SpO2 99% on room air.  Labs notable for  sodium 146, potassium 2.7, proBNP 305, WBC 11, platelets 426, Phosphorus 1.5. CXR obtained and shows no acute cardiopulmonary abnormality, there is a moderate chronic hiatal hernia.  She was given 20 mg IV Lasix, 1 mg Dilaudid , 20 mg IV labetalol , 4 mg IV morphine , and 60 mEq IV K ordered however only ~20 mEq IV K given secondary to discomfort at IV site. TRH contacted for admission.  Review of Systems: As mentioned in the history of present illness. All other systems reviewed and are negative.  Review of Systems  Constitutional:  Positive for malaise/fatigue. Negative for chills and fever.  HENT:  Negative for congestion.   Respiratory:  Negative for cough, sputum production, shortness of breath and wheezing.   Cardiovascular:  Positive for chest pain and leg swelling.  Gastrointestinal:  Positive for heartburn and nausea. Negative for abdominal pain and vomiting.  Musculoskeletal:  Negative for myalgias.  Neurological:  Negative for dizziness, focal weakness and weakness.  Psychiatric/Behavioral:  The patient is nervous/anxious.   All other systems reviewed and are negative.   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    Past Surgical History:  Procedure Laterality Date   ABDOMINAL HYSTERECTOMY     ABDOMINAL SURGERY     tumor removed   APPENDECTOMY     BRAIN SURGERY     to fix AVM   CEREBRAL ANEURYSM REPAIR     CERVICAL FUSION     CESAREAN SECTION     CHOLECYSTECTOMY     ESOPHAGEAL DILATION N/A 11/28/2023   Procedure: DILATION, ESOPHAGUS;   Surgeon: Cindie Carlin POUR, DO;  [facility];  Service: Endoscopy;  Laterality: N/A;   ESOPHAGOGASTRODUODENOSCOPY N/A 11/28/2023   Procedure: EGD (ESOPHAGOGASTRODUODENOSCOPY);  Surgeon: Cindie Carlin POUR, DO;  Location: [facility];  Service: Endoscopy;  Laterality: N/A;   ESOPHAGOGASTRODUODENOSCOPY N/A 12/05/2023   Procedure: EGD (ESOPHAGOGASTRODUODENOSCOPY);  Surgeon: Eartha Flavors, Toribio, MD;  Location: [facility];  Service: Gastroenterology;  Laterality: N/A;   LUNG SURGERY     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     reports that she has been smoking cigarettes. She has never used smokeless tobacco. She reports that she does not drink alcohol and does not use drugs.  Allergies  Allergen Reactions   Buprenorphine Hcl Swelling    All extremities and neck and face swelled up huge according to patient   Bupropion Swelling and Other (See Comments)    Moody and depressed   Codeine Itching, Nausea And Vomiting, Nausea Only and Swelling   Sulfamethoxazole-Trimethoprim Swelling and Other (See Comments)    Swelled up her eye   Nsaids Other (See Comments)    Unknown    Pregabalin  Swelling    Lyrica     Sulfa Antibiotics Other (See Comments)    Unknown    Tapentadol Nausea And Vomiting   Clindamycin Other (See Comments)    Heart burn   Clindamycin/Lincomycin Rash and Other (See Comments)    Heart burn   Cymbalta  [Duloxetine  Hcl] Swelling   Diclofenac Swelling    Cream    Diclofenac Sodium Swelling    Oral med   Gabapentin Swelling   Oxycodone  Nausea Only    No longer has a reaction per pt   Toradol  [Ketorolac  Tromethamine ] Rash    Family History  Problem Relation Age of Onset   Throat cancer Mother    Bladder Cancer Father    Liver cancer Father    Colon cancer Neg Hx     Prior to Admission medications   Medication Sig Start Date End Date Taking? Authorizing Provider  amLODipine  (NORVASC ) 5 MG tablet Take 5 mg by  mouth daily. 11/24/23  Yes [provider]  diclofenac Sodium (VOLTAREN) 1 % GEL Apply 2 g topically 4 (four) times daily. 04/18/20  Yes [provider]  DULoxetine  (CYMBALTA ) 60 MG capsule Take 60 mg by mouth 2 (two) times daily. 05/16/20  Yes [provider]  erythromycin  ophthalmic ointment Place 1 Application into the left eye 3 (three) times daily.   Yes [provider]  HYDROmorphone  (DILAUDID ) 4 MG tablet Take 4 mg by mouth every 4 (four) hours as needed for severe pain (pain score 7-10).   Yes [provider]  labetalol  (NORMODYNE ) 200 MG tablet Take 200 mg by mouth 2 (two) times daily.   Yes [provider]  morphine  (MS CONTIN ) 15 MG 12 hr tablet Take 15 mg by mouth 2 (two) times daily. 12/05/23  Yes [provider]  morphine  (MSIR) 15 MG tablet  Take 15 mg by mouth every 6 (six) hours as needed for severe pain (pain score 7-10). 12/05/23  Yes [provider]  ondansetron  (ZOFRAN -ODT) 8 MG disintegrating tablet Take 1 tablet (8 mg total) by mouth every 6 (six) hours as needed for nausea or vomiting. 11/29/23  Yes Darci Pore, MD  pantoprazole  (PROTONIX ) 40 MG tablet Take 1 tablet (40 mg total) by mouth 2 (two) times daily. 11/29/23  Yes Darci Pore, MD  phenytoin  (DILANTIN ) 100 MG ER capsule Take 100 mg by mouth. 12/09/23  Yes [provider]  potassium chloride  SA (KLOR-CON  M) 20 MEQ tablet Take 2 tablets (40 mEq total) by mouth daily. 11/29/23  Yes Darci Pore, MD  prednisoLONE  acetate (PRED FORTE ) 1 % ophthalmic suspension Place 1 drop into the left eye in the morning and at bedtime. 03/01/20  Yes [provider]  pregabalin  (LYRICA ) 50 MG capsule Take 50 mg by mouth 3 (three) times daily. 12/08/23  Yes [provider]  rOPINIRole  (REQUIP ) 2 MG tablet Take 4 mg by mouth See admin instructions. Take 1 tablet in the morning and 2 tablet every evening. 11/11/14  Yes  [provider]  simethicone (GAS RELIEF EXTRA STRENGTH) 125 MG chewable tablet Chew 125 mg by mouth daily as needed for flatulence.  05/24/14  Yes [provider]  sucralfate (CARAFATE) 1 GM/10ML suspension Take 10 mLs (1 g total) by mouth 4 (four) times daily -  with meals and at bedtime. 12/07/23  Yes Tat, Alm, MD  lisinopril  (PRINIVIL ,ZESTRIL ) 40 MG tablet Take 40 mg by mouth daily. Patient not taking: Reported on 12/12/2023 01/31/14 12/12/23  [provider]    Physical Exam: Vitals:   12/12/23 0918 12/12/23 0930 12/12/23 0945 12/12/23 1017  BP: (!) 225/117 (!) 172/99 (!) 178/95 (!) 154/93  Pulse: (!) 102 81 95 93  Resp: 17 11 13 20   Temp: 98.9 F (37.2 C)   99.1 F (37.3 C)  TempSrc: Oral   Oral  SpO2: 98% 99% 100% 98%  Weight:      Height:        Physical Exam Vitals and nursing note reviewed.  HENT:     Head: Normocephalic.  Eyes:     Pupils: Pupils are equal, round, and reactive to light.     Comments: (L) ptosis  Cardiovascular:     Rate and Rhythm: Normal rate and regular rhythm.     Heart sounds: Normal heart sounds. No murmur heard.    No friction rub. No gallop.  Chest:     Chest wall: No tenderness.  Abdominal:     Palpations: Abdomen is soft.     Tenderness: There is abdominal tenderness.  Musculoskeletal:     Right lower leg: Edema present.     Left lower leg: Edema present.  Skin:    General: Skin is warm and dry.     Capillary Refill: Capillary refill takes less than 2 seconds.  Neurological:     Mental Status: She is alert and oriented to person, place, and time.      Labs on Admission: I have personally reviewed following labs and imaging studies  CBC: Recent Labs  Lab 12/06/23 0345 12/06/23 0858 12/06/23 1414 12/07/23 0429 12/12/23 0435  WBC  --   --   --  6.4 11.0*  NEUTROABS  --   --   --   --  7.5  HGB 11.4* 11.2* 10.5* 10.6* 12.1  HCT 34.9* 34.6* 32.0* 33.1* 38.0  MCV  --   --   --  87.3 86.0  PLT  --    --   --  543* 426*   Basic Metabolic Panel: Recent Labs  Lab 12/06/23 0345 12/07/23 0429 12/12/23 0435 12/12/23 0722  NA 137 140 146*  --   K 3.2* 3.7 2.7*  --   CL 104 108 105  --   CO2 21* 23 28  --   GLUCOSE 122* 103* 97  --   BUN 5* 6* 5*  --   CREATININE 0.59 0.55 0.51  --   CALCIUM  9.2 8.9 9.3  --   MG  --  2.0  --  1.7  PHOS  --   --   --  1.5*   GFR: Estimated Creatinine Clearance: 48.5 mL/min (by C-G formula based on SCr of 0.51 mg/dL). Liver Function Tests: No results for input(s): AST, ALT, ALKPHOS, BILITOT, PROT, ALBUMIN in the last 168 hours. No results for input(s): LIPASE, AMYLASE in the last 168 hours. No results for input(s): AMMONIA in the last 168 hours. Coagulation Profile: No results for input(s): INR, PROTIME in the last 168 hours. Cardiac Enzymes: No results for input(s): CKTOTAL, CKMB, CKMBINDEX, TROPONINI, TROPONINIHS in the last 168 hours. BNP (last 3 results) Recent Labs    11/12/23 1200  BNP 132.0*   HbA1C: No results for input(s): HGBA1C in the last 72 hours. CBG: Recent Labs  Lab 12/06/23 0719 12/07/23 0724  GLUCAP 115* 106*   Lipid Profile: No results for input(s): CHOL, HDL, LDLCALC, TRIG, CHOLHDL, LDLDIRECT in the last 72 hours. Thyroid Function Tests: Recent Labs    12/12/23 0722  TSH 2.320   Anemia Panel: No results for input(s): VITAMINB12, FOLATE, FERRITIN, TIBC, IRON, RETICCTPCT in the last 72 hours. Urine analysis:    Component Value Date/Time   COLORURINE STRAW (A) 11/25/2023 2053   APPEARANCEUR CLEAR 11/25/2023 2053   LABSPEC 1.011 11/25/2023 2053   PHURINE 7.0 11/25/2023 2053   GLUCOSEU NEGATIVE 11/25/2023 2053   HGBUR NEGATIVE 11/25/2023 2053   BILIRUBINUR NEGATIVE 11/25/2023 2053   KETONESUR 20 (A) 11/25/2023 2053   PROTEINUR NEGATIVE 11/25/2023 2053   UROBILINOGEN 0.2 12/03/2014 1500   NITRITE NEGATIVE 11/25/2023 2053   LEUKOCYTESUR NEGATIVE  11/25/2023 2053    Radiological Exams on Admission: I have personally reviewed images DG Chest Port 1 View Result Date: 12/12/2023 EXAM: 1 VIEW(S) XRAY OF THE CHEST 12/12/2023 04:42:00 AM COMPARISON: 12/01/2023 CLINICAL HISTORY: CP, HTN, leg swelling. CP, HTN, leg/foot swelling and pain, elevated BP @ time of imaging (203/93), painful stomach palpitations. FINDINGS: LUNGS AND PLEURA: Left base atelectasis. Mild right basilar atelectasis or scarring. No pulmonary edema. No pleural effusion. Skin fold artifact right chest. No pneumothorax. HEART AND MEDIASTINUM: Aortic calcification. Moderate chronic hiatal hernia. BONES AND SOFT TISSUES: Cervical spine surgical hardware noted. No acute osseous abnormality. IMPRESSION: 1. No acute cardiopulmonary abnormality ;minor lung base atelectasis. 2. Moderate chronic hiatal hernia. Electronically signed by: Helayne Hurst MD 12/12/2023 05:58 AM EDT RP Workstation: [id number]    EKG: My personal interpretation of EKG shows: Normal sinus rhythm heart rate 99   Assessment/Plan Principal Problem:   Hypokalemia   ##Bilateral lower extremity edema With elevated proBNP, uncontrolled hypertension, bilateral lower extremity edema there is concern for CHF - 40 mg IV Lasix twice daily - ECHO - Strict I's and O's - Daily weights  ##Hypokalemia - s/p 20 mEq IV K, patient unable to tolerate more secondary to burning at IV site - 40 mEq p.o. K x 3  ##Hypophosphatemia - Replete with  Phos-Nak 2 packets every 4 hours x 3 doses  #Gastric ulcer #GERD - Twice daily PPI - Carafate  #Uncontrolled hypertension - Continue home labetalol , increase home amlodipine  to 10 mg daily - As needed hydralazine   #Chronic kidney disease stage II Stable - Avoid nephrotoxins, contrast Dyes, Hypotension and Dehydration - Repeat Metabolic panel with a.m. labs  #Anxiety - Continue home Cymbalta   #Restless leg syndrome - Continue home Requip   VTE prophylaxis:  SCDs  GI  prophylaxis: Protonix  Diet: Heart healthy Access: PIV Lines: None Code Status:  Full Code Telemetry: Yes  Admission status: Observation, Telemetry bed Patient is from: Home Anticipated d/c is to: Home Anticipated d/c date is: 1 to 2 days Patient currently: B   Family Communication: No family at bedside. Plan of care discussed with patient at bedside.  Questions welcomed and elicited.  Questions answered to patient's expressed satisfaction.  Patient is cognitively able to update family independently if desired.    Consults called: N/A    Severity of Illness: The appropriate patient status for this patient is OBSERVATION. Observation status is judged to be reasonable and necessary in order to provide the required intensity of service to ensure the patient's safety. The patient's presenting symptoms, physical exam findings, and initial radiographic and laboratory data in the context of their medical condition is felt to place them at decreased risk for further clinical deterioration. Furthermore, it is anticipated that the patient will be medically stable for discharge from the hospital within 2 midnights of admission.   To reach the provider On-Call:   7AM- 7PM see care teams to locate the attending and reach out to them via [email or web address] [email or web address] 7PM-7AM contact night-coverage If you still have difficulty reaching the appropriate provider, please page the [facility] (Director on Call) for [facility] on amion for assistance  This document was prepared using Conservation officer, historic buildings and may include unintentional dictation errors.  Rockie Rams FNP-BC, PMHNP-BC Nurse Practitioner [facility]

## 2023-12-12 NOTE — Care Management Obs Status (Signed)
 MEDICARE OBSERVATION STATUS NOTIFICATION   Patient Details  Name: Renee Gutierrez MRN: [id number] Date of Birth: 28-May-1960   Medicare Observation Status Notification Given:  Yes    Renee Gutierrez 12/12/2023, 3:39 PM

## 2023-12-12 NOTE — Plan of Care (Signed)
   Problem: Clinical Measurements: Goal: Ability to maintain clinical measurements within normal limits will improve Outcome: Progressing Goal: Will remain free from infection Outcome: Progressing

## 2023-12-12 NOTE — Discharge Instructions (Addendum)

## 2023-12-12 NOTE — ED Notes (Signed)
 Provider made aware that Pt's K+ is going at 50 an hour and NS is going at 35 an hour.

## 2023-12-12 NOTE — ED Triage Notes (Signed)
 Pt reports bilateral foot/ankle swelling that started four days ago.  She is also c/o of dull chest pain that started yesterday.

## 2023-12-12 NOTE — Plan of Care (Signed)

## 2023-12-12 NOTE — Progress Notes (Signed)
  Echocardiogram 2D Echocardiogram has been performed.  Koleen KANDICE Popper, RDCS 12/12/2023, 2:20 PM

## 2023-12-13 DIAGNOSIS — G894 Chronic pain syndrome: Secondary | ICD-10-CM | POA: Diagnosis present

## 2023-12-13 DIAGNOSIS — M797 Fibromyalgia: Secondary | ICD-10-CM | POA: Diagnosis present

## 2023-12-13 DIAGNOSIS — Z79899 Other long term (current) drug therapy: Secondary | ICD-10-CM | POA: Diagnosis not present

## 2023-12-13 DIAGNOSIS — I10 Essential (primary) hypertension: Secondary | ICD-10-CM | POA: Diagnosis not present

## 2023-12-13 DIAGNOSIS — N182 Chronic kidney disease, stage 2 (mild): Secondary | ICD-10-CM | POA: Diagnosis present

## 2023-12-13 DIAGNOSIS — Z881 Allergy status to other antibiotic agents status: Secondary | ICD-10-CM | POA: Diagnosis not present

## 2023-12-13 DIAGNOSIS — I3139 Other pericardial effusion (noninflammatory): Secondary | ICD-10-CM | POA: Diagnosis present

## 2023-12-13 DIAGNOSIS — I130 Hypertensive heart and chronic kidney disease with heart failure and stage 1 through stage 4 chronic kidney disease, or unspecified chronic kidney disease: Secondary | ICD-10-CM | POA: Diagnosis present

## 2023-12-13 DIAGNOSIS — Z882 Allergy status to sulfonamides status: Secondary | ICD-10-CM | POA: Diagnosis not present

## 2023-12-13 DIAGNOSIS — F419 Anxiety disorder, unspecified: Secondary | ICD-10-CM | POA: Diagnosis present

## 2023-12-13 DIAGNOSIS — E876 Hypokalemia: Secondary | ICD-10-CM | POA: Diagnosis present

## 2023-12-13 DIAGNOSIS — Z800 Family history of malignant neoplasm of digestive organs: Secondary | ICD-10-CM | POA: Diagnosis not present

## 2023-12-13 DIAGNOSIS — Z888 Allergy status to other drugs, medicaments and biological substances status: Secondary | ICD-10-CM | POA: Diagnosis not present

## 2023-12-13 DIAGNOSIS — E785 Hyperlipidemia, unspecified: Secondary | ICD-10-CM | POA: Diagnosis present

## 2023-12-13 DIAGNOSIS — I5033 Acute on chronic diastolic (congestive) heart failure: Secondary | ICD-10-CM | POA: Diagnosis present

## 2023-12-13 DIAGNOSIS — Z8052 Family history of malignant neoplasm of bladder: Secondary | ICD-10-CM | POA: Diagnosis not present

## 2023-12-13 DIAGNOSIS — G2581 Restless legs syndrome: Secondary | ICD-10-CM | POA: Diagnosis present

## 2023-12-13 DIAGNOSIS — K219 Gastro-esophageal reflux disease without esophagitis: Secondary | ICD-10-CM | POA: Diagnosis present

## 2023-12-13 DIAGNOSIS — R600 Localized edema: Secondary | ICD-10-CM | POA: Diagnosis not present

## 2023-12-13 DIAGNOSIS — Z885 Allergy status to narcotic agent status: Secondary | ICD-10-CM | POA: Diagnosis not present

## 2023-12-13 DIAGNOSIS — K257 Chronic gastric ulcer without hemorrhage or perforation: Secondary | ICD-10-CM | POA: Diagnosis present

## 2023-12-13 DIAGNOSIS — Z886 Allergy status to analgesic agent status: Secondary | ICD-10-CM | POA: Diagnosis not present

## 2023-12-13 DIAGNOSIS — Z808 Family history of malignant neoplasm of other organs or systems: Secondary | ICD-10-CM | POA: Diagnosis not present

## 2023-12-13 DIAGNOSIS — Z87891 Personal history of nicotine dependence: Secondary | ICD-10-CM | POA: Diagnosis not present

## 2023-12-13 LAB — BASIC METABOLIC PANEL WITH GFR
Anion gap: 11 (ref 5–15)
BUN: <5 mg/dL — ABNORMAL LOW (ref 8–23)
CO2: 33 mmol/L — ABNORMAL HIGH (ref 22–32)
Calcium: 9 mg/dL (ref 8.9–10.3)
Chloride: 99 mmol/L (ref 98–111)
Creatinine, Ser: 0.49 mg/dL (ref 0.44–1.00)
GFR, Estimated: >60 mL/min (ref >60)
Glucose, Bld: 110 mg/dL — ABNORMAL HIGH (ref 70–99)
Potassium: 2.5 mmol/L — CL (ref 3.5–5.1)
Sodium: 143 mmol/L (ref 135–145)

## 2023-12-13 LAB — POTASSIUM: Potassium: 3 mmol/L — ABNORMAL LOW (ref 3.5–5.1)

## 2023-12-13 LAB — CORTISOL: Cortisol, Plasma: 19 ug/dL

## 2023-12-13 LAB — MAGNESIUM: Magnesium: 2.2 mg/dL (ref 1.7–2.4)

## 2023-12-13 MED ORDER — HYDROMORPHONE HCL 4 MG PO TABS
4.0000 mg | ORAL_TABLET | ORAL | Status: DC | PRN
  Administered 2023-12-13 – 2023-12-14 (×3): 4 mg via ORAL
  Filled 2023-12-13 (×3): qty 1

## 2023-12-13 MED ORDER — MAGNESIUM SULFATE 2 GM/50ML IV SOLN
2.0000 g | Freq: Once | INTRAVENOUS | Status: AC
  Administered 2023-12-13: 2 g via INTRAVENOUS
  Filled 2023-12-13: qty 50

## 2023-12-13 MED ORDER — POTASSIUM CHLORIDE CRYS ER 20 MEQ PO TBCR
40.0000 meq | EXTENDED_RELEASE_TABLET | Freq: Two times a day (BID) | ORAL | Status: AC
  Administered 2023-12-13 – 2023-12-14 (×2): 40 meq via ORAL
  Filled 2023-12-13 (×2): qty 2

## 2023-12-13 MED ORDER — POTASSIUM CHLORIDE 10 MEQ/100ML IV SOLN
10.0000 meq | INTRAVENOUS | Status: AC
  Administered 2023-12-13 (×4): 10 meq via INTRAVENOUS
  Filled 2023-12-13 (×4): qty 100

## 2023-12-13 MED ORDER — MORPHINE SULFATE 15 MG PO TABS: 15.0000 mg | ORAL_TABLET | Freq: Four times a day (QID) | ORAL | Status: DC | PRN

## 2023-12-13 NOTE — Progress Notes (Signed)
 [facility]                                                                               Renee Gutierrez, is a 63 y.o. female, DOB - [birth year], FMW:[id number] Admit date - 12/12/2023    Outpatient Primary MD for the Gutierrez is Gutierrez, No Pcp Per  LOS - 0  days    Brief summary   Renee Gutierrez is a 63 y.o. 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 Of note she was recently admitted twice this month. First Admission for hypokalemia and colitis and was found to have a large ulcer distal to her esophagus during EGD.  The second admission was for hematemesis from gastric ulcer with hemorrhage.  She presents to [facility], ED with ports of bilateral lower extremities edema that began 4 days ago.  During her recent admission she was transfused with PRBCs and received IVF and there is concern Gutierrez is fluid volume overloaded from those interventions.  She denies dyspnea on exertion and palpitations.  She endorses fatigue, dull chest/epigastric pain that has been ongoing and was addressed last hospitalization by gastroenterologist Dr Renee Gutierrez has presence of ulceration of the cardia in a atypicaln location.  It is very possible this is related to the herniated sac and ischemia related to this.  I discussed with the Gutierrez that she will benefit from evaluation by foregut surgeon for possible hiatal hernia repair at [facility] current surgery as this may allow better healing of the ulceration.    CXR obtained and shows no acute cardiopulmonary abnormality, there is a moderate chronic hiatal hernia.  She was given 20 mg IV Lasix, 1 mg Dilaudid , 20 mg IV  labetalol , 4 mg IV morphine , and 60 mEq IV K ordered however only ~20 mEq IV K given secondary to discomfort at IV site. [facility] contacted for admission.    Assessment & Plan    Assessment and Plan:  Bilateral lower extremity edema probably secondary to mild acute on chronic diastolic CHF. BNP elevated at 355, troponins are within normal limits She was started on IV Lasix 40 mg twice daily Echocardiogram done showed left ventricular ejection fraction greater than 75% with left ventricular diastolic grade 1 dysfunction. Continue with strict intake and output and daily weights. We will currently hold off on the IV Lasix until her potassium level improves.  She currently denies any shortness of breath or chest pain at this time pedal edema is improving.     Hypokalemia, hypophosphatemia and hypomagnesemia are all improving Replace and recheck levels later today.   History of Gastric ulcer Gutierrez currently on PPI and sucralfate  continue the same   Hypertension Blood pressure parameters appear to be normal  Continue with Norvasc .     Estimated body mass index is 21.06 kg/m as calculated from the following:   Height as of this encounter: 4' 11 (1.499 m).   Weight  as of this encounter: 47.3 kg.  Code Status: full code.  DVT Prophylaxis:  Place TED hose Start: 12/12/23 1122 SCDs Start: 12/12/23 0719   Level of Care: Level of care: Telemetry Family Communication: none at bedside.   Disposition Plan:     Remains inpatient appropriate:  pending clinical improvement and hypokalemia  Procedures:  Echocardiogram.   Consultants:   None.   Antimicrobials:   Anti-infectives (From admission, onward)    None        Medications  Scheduled Meds:  amLODipine   10 mg Oral Daily   DULoxetine   60 mg Oral BID   furosemide  40 mg Intravenous BID   labetalol   200 mg Oral BID   pantoprazole   40 mg Oral BID   prednisoLONE  acetate  1 drop Left Eye BID   rOPINIRole   4 mg Oral  QHS   sodium chloride  flush  3 mL Intravenous Q12H   sucralfate  1 g Oral TID WC & HS   Continuous Infusions:  potassium chloride  10 mEq (12/13/23 1140)   PRN Meds:.acetaminophen  **OR** acetaminophen , hydrALAZINE , methocarbamol (ROBAXIN) injection, morphine , prochlorperazine , sodium chloride  flush    Subjective:   Renee Gutierrez was seen and examined today. No sob, or chest pain.   Objective:   Vitals:   12/12/23 2020 12/12/23 2153 12/13/23 0500 12/13/23 0530  BP: 139/74 124/72  133/64  Pulse: 88 72  74  Resp:  20  18  Temp:  98.4 F (36.9 C)  97.8 F (36.6 C)  TempSrc:  Oral  Oral  SpO2:  96%  94%  Weight:   47.3 kg   Height:        Intake/Output Summary (Last 24 hours) at 12/13/2023 1235 Last data filed at 12/13/2023 0900 Gross per 24 hour  Intake 783 ml  Output --  Net 783 ml   Filed Weights   12/12/23 0415 12/13/23 0500  Weight: 46.7 kg 47.3 kg     Exam General exam: Appears calm and comfortable  Respiratory system: Clear to auscultation. Respiratory effort normal. Cardiovascular system: S1 & S2 heard, RRR.  Gastrointestinal system: Abdomen is nondistended, soft and nontender.  Central nervous system: Alert and oriented.  Extremities: Symmetric 5 x 5 power. Skin: No rashes,  Psychiatry: Mood & affect appropriate.     Data Reviewed:  I have personally reviewed following labs and imaging studies   CBC Lab Results  Component Value Date   WBC 11.0 (H) 12/12/2023   RBC 4.42 12/12/2023   HGB 12.1 12/12/2023   HCT 38.0 12/12/2023   MCV 86.0 12/12/2023   MCH 27.4 12/12/2023   PLT 426 (H) 12/12/2023   MCHC 31.8 12/12/2023   RDW 14.7 12/12/2023   LYMPHSABS 2.4 12/12/2023   MONOABS 0.7 12/12/2023   EOSABS 0.1 12/12/2023   BASOSABS 0.1 12/12/2023     Last metabolic panel Lab Results  Component Value Date   NA 143 12/13/2023   K 2.5 (LL) 12/13/2023   CL 99 12/13/2023   CO2 33 (H) 12/13/2023   BUN <5 (L) 12/13/2023   CREATININE 0.49  12/13/2023   GLUCOSE 110 (H) 12/13/2023   GFRNONAA >60 12/13/2023   GFRAA >60 05/18/2016   CALCIUM  9.0 12/13/2023   PHOS 2.6 12/12/2023   PROT 5.7 (L) 12/04/2023   ALBUMIN 3.1 (L) 12/04/2023   BILITOT <0.2 12/04/2023   ALKPHOS 64 12/04/2023   AST 12 (L) 12/04/2023   ALT 7 12/04/2023   ANIONGAP 11 12/13/2023    CBG (last 3)  No results for input(s): GLUCAP in the last 72 hours.    Coagulation Profile: No results for input(s): INR, PROTIME in the last 168 hours.   Radiology Studies: ECHOCARDIOGRAM COMPLETE Result Date: 12/12/2023    ECHOCARDIOGRAM REPORT   Gutierrez Name:   Renee Gutierrez Herman Date of Exam: 12/12/2023 Medical Rec #:  [id number]         Height:       59.0 in Accession #:    [id number]        Weight:       103.0 lb Date of Birth:  1961-02-07          BSA:          1.391 m Gutierrez Age:    63 years          BP:           139/83 mmHg Gutierrez Gender: F                 HR:           107 bpm. Exam Location:  [facility] Procedure: 2D Echo, Strain Analysis, Cardiac Doppler and Color Doppler (Both            Spectral and Color Flow Doppler were utilized during procedure). Indications:    CHF- Acute Diastolic I50.31  History:        Gutierrez has no prior history of Echocardiogram examinations.                 Signs/Symptoms:Chest Pain; Risk Factors:Hypertension,                 Pre-diabetes, Dyslipidemia and Current Smoker.  Sonographer:    Koleen Popper RDCS Referring Phys: [phone] CARLOS MADERA  Sonographer Comments: Image acquisition challenging due to respiratory motion. Global longitudinal strain was attempted. IMPRESSIONS  1. Left ventricular ejection fraction, by estimation, is >75%. The left ventricle has hyperdynamic function. The left ventricle has no regional wall motion abnormalities. Left ventricular diastolic parameters are consistent with Grade I diastolic dysfunction (impaired relaxation). The average left ventricular global longitudinal strain is -18.9 %. The global  longitudinal strain is normal.  2. Right ventricular systolic function is normal. The right ventricular size is normal. Tricuspid regurgitation signal is inadequate for assessing PA pressure.  3. Moderate pericardial effusion is anterior to the right ventricle. Small pericardial effusion is lateral to LV and localized along RA. There is no evidence of cardiac tamponade.  4. The mitral valve is normal in structure. No evidence of mitral valve regurgitation. No evidence of mitral stenosis.  5. The aortic valve is tricuspid. Aortic valve regurgitation is not visualized. No aortic stenosis is present.  6. The inferior vena cava is normal in size with greater than 50% respiratory variability, suggesting right atrial pressure of 3 mmHg. Comparison(s): No prior Echocardiogram. FINDINGS  Left Ventricle: Left ventricular ejection fraction, by estimation, is >75%. The left ventricle has hyperdynamic function. The left ventricle has no regional wall motion abnormalities. The average left ventricular global longitudinal strain is -18.9 %. Strain was performed and the global longitudinal strain is normal. The left ventricular internal cavity size was normal in size. There is no left ventricular hypertrophy. Left ventricular diastolic parameters are consistent with Grade I diastolic dysfunction (impaired relaxation). Normal left ventricular filling pressure. Right Ventricle: The right ventricular size is normal. No increase in right ventricular wall thickness. Right ventricular systolic function is normal. Tricuspid regurgitation signal is inadequate for assessing PA pressure. Left Atrium:  Left atrial size was normal in size. Right Atrium: Right atrial size was normal in size. Pericardium: A moderately sized pericardial effusion is present. The pericardial effusion is anterior to the right ventricle. There is no evidence of cardiac tamponade. Mitral Valve: The mitral valve is normal in structure. No evidence of mitral valve  regurgitation. No evidence of mitral valve stenosis. Tricuspid Valve: The tricuspid valve is normal in structure. Tricuspid valve regurgitation is not demonstrated. No evidence of tricuspid stenosis. Aortic Valve: The aortic valve is tricuspid. Aortic valve regurgitation is not visualized. No aortic stenosis is present. Pulmonic Valve: The pulmonic valve was grossly normal. Pulmonic valve regurgitation is not visualized. No evidence of pulmonic stenosis. Aorta: The aortic root and ascending aorta are structurally normal, with no evidence of dilitation. Venous: The inferior vena cava is normal in size with greater than 50% respiratory variability, suggesting right atrial pressure of 3 mmHg. IAS/Shunts: No atrial level shunt detected by color flow Doppler. Additional Comments: 3D was performed not requiring image post processing on an independent workstation and was indeterminate.  LEFT VENTRICLE PLAX 2D LVIDd:         3.40 cm   Diastology LVIDs:         2.00 cm   LV e' medial:    5.44 cm/s LV PW:         1.10 cm   LV E/e' medial:  12.9 LV IVS:        1.10 cm   LV e' lateral:   9.25 cm/s LVOT diam:     1.80 cm   LV E/e' lateral: 7.6 LV SV:         47 LV SV Index:   34        2D Longitudinal Strain LVOT Area:     2.54 cm  2D Strain GLS Avg:     -18.9 %  RIGHT VENTRICLE             IVC RV Basal diam:  2.60 cm     IVC diam: 1.00 cm RV S prime:     20.00 cm/s TAPSE (M-mode): 2.2 cm LEFT ATRIUM             Index        RIGHT ATRIUM          Index LA diam:        2.70 cm 1.94 cm/m   RA Area:     8.90 cm LA Vol (A2C):   28.1 ml 20.20 ml/m  RA Volume:   14.30 ml 10.28 ml/m LA Vol (A4C):   32.5 ml 23.36 ml/m LA Biplane Vol: 31.7 ml 22.79 ml/m  AORTIC VALVE LVOT Vmax:   111.00 cm/s LVOT Vmean:  72.800 cm/s LVOT VTI:    0.184 m  AORTA Ao Root diam: 3.00 cm Ao Asc diam:  2.90 cm MITRAL VALVE MV Area (PHT): 5.46 cm     SHUNTS MV Decel Time: 139 msec     Systemic VTI:  0.18 m MV E velocity: 70.20 cm/s   Systemic Diam: 1.80  cm MV A velocity: 112.00 cm/s MV E/A ratio:  0.63 Vishnu Priya Mallipeddi Electronically signed by Diannah Late Mallipeddi Signature Date/Time: 12/12/2023/4:59:17 PM    Final    DG Chest Port 1 View Result Date: 12/12/2023 EXAM: 1 VIEW(S) XRAY OF THE CHEST 12/12/2023 04:42:00 AM COMPARISON: 12/01/2023 CLINICAL HISTORY: CP, HTN, leg swelling. CP, HTN, leg/foot swelling and pain, elevated BP @ time of imaging (203/93), painful stomach palpitations.  FINDINGS: LUNGS AND PLEURA: Left base atelectasis. Mild right basilar atelectasis or scarring. No pulmonary edema. No pleural effusion. Skin fold artifact right chest. No pneumothorax. HEART AND MEDIASTINUM: Aortic calcification. Moderate chronic hiatal hernia. BONES AND SOFT TISSUES: Cervical spine surgical hardware noted. No acute osseous abnormality. IMPRESSION: 1. No acute cardiopulmonary abnormality ;minor lung base atelectasis. 2. Moderate chronic hiatal hernia. Electronically signed by: Helayne Hurst MD 12/12/2023 05:58 AM EDT RP Workstation: [id number]       Elgie Butter M.D. [facility] 12/13/2023, 12:35 PM  Available via Epic secure chat 7am-7pm After 7 pm, please refer to night coverage provider listed on amion.

## 2023-12-13 NOTE — Plan of Care (Signed)

## 2023-12-13 NOTE — Progress Notes (Signed)
 Patient is non-compliant with her bed alarm and has been spoken to by this writer several times already since the start of the shift regarding safety/fall risk/using her call light vs getting OOB unassisted. Pt disregards all teaching and instruction and still gets up by herself while not calling for help or making any attempts to seek staffs assistance prior to getting OOB to go to the bathroom. Pt stated to this writer turn that bed alarm off. If you don't I will, I know how. I don't need that thing on. Bed alarm was turned off, however, staff overseeing patients care has been informed of pts refusal of bed alarm, pt has been educated of the risks she is taking and acknowledges all risks associated with failure to use bed alarm and failure to ask for assistance prior to ambulating back and forth to the bathroom. Pt states that I have to pee so much I can't wait on help!. Staff will round more frequently while offering assistance to the BR during this shift. Pt has been observed ambulating with steady gait to and from the bathroom.

## 2023-12-14 DIAGNOSIS — E785 Hyperlipidemia, unspecified: Secondary | ICD-10-CM | POA: Diagnosis not present

## 2023-12-14 DIAGNOSIS — N182 Chronic kidney disease, stage 2 (mild): Secondary | ICD-10-CM | POA: Diagnosis not present

## 2023-12-14 DIAGNOSIS — K279 Peptic ulcer, site unspecified, unspecified as acute or chronic, without hemorrhage or perforation: Secondary | ICD-10-CM

## 2023-12-14 DIAGNOSIS — I10 Essential (primary) hypertension: Secondary | ICD-10-CM | POA: Diagnosis not present

## 2023-12-14 DIAGNOSIS — I5033 Acute on chronic diastolic (congestive) heart failure: Secondary | ICD-10-CM | POA: Diagnosis not present

## 2023-12-14 DIAGNOSIS — F419 Anxiety disorder, unspecified: Secondary | ICD-10-CM

## 2023-12-14 LAB — BASIC METABOLIC PANEL WITH GFR
Anion gap: 7 (ref 5–15)
BUN: <5 mg/dL — ABNORMAL LOW (ref 8–23)
CO2: 33 mmol/L — ABNORMAL HIGH (ref 22–32)
Calcium: 8.8 mg/dL — ABNORMAL LOW (ref 8.9–10.3)
Chloride: 101 mmol/L (ref 98–111)
Creatinine, Ser: 0.53 mg/dL (ref 0.44–1.00)
GFR, Estimated: >60 mL/min (ref >60)
Glucose, Bld: 89 mg/dL (ref 70–99)
Potassium: 3.3 mmol/L — ABNORMAL LOW (ref 3.5–5.1)
Sodium: 141 mmol/L (ref 135–145)

## 2023-12-14 MED ORDER — SPIRONOLACTONE 25 MG PO TABS: 25.0000 mg | ORAL_TABLET | Freq: Every day | ORAL | 0 refills | Status: DC

## 2023-12-14 MED ORDER — POTASSIUM CHLORIDE CRYS ER 20 MEQ PO TBCR
40.0000 meq | EXTENDED_RELEASE_TABLET | ORAL | Status: AC
  Administered 2023-12-14 (×2): 40 meq via ORAL
  Filled 2023-12-14 (×2): qty 2

## 2023-12-14 MED ORDER — AMLODIPINE BESYLATE 10 MG PO TABS: 10.0000 mg | ORAL_TABLET | Freq: Every day | ORAL | 0 refills | Status: DC

## 2023-12-14 MED ORDER — LISINOPRIL 10 MG PO TABS
10.0000 mg | ORAL_TABLET | Freq: Every day | ORAL | Status: DC
  Administered 2023-12-14: 10 mg via ORAL
  Filled 2023-12-14: qty 1

## 2023-12-14 MED ORDER — LABETALOL HCL 200 MG PO TABS: 200.0000 mg | ORAL_TABLET | Freq: Two times a day (BID) | ORAL | 0 refills | Status: DC

## 2023-12-14 MED ORDER — ERYTHROMYCIN 5 MG/GM OP OINT: 1.0000 | TOPICAL_OINTMENT | Freq: Three times a day (TID) | OPHTHALMIC | 0 refills | Status: DC

## 2023-12-14 MED ORDER — PREDNISOLONE ACETATE 1 % OP SUSP: 1.0000 [drp] | Freq: Two times a day (BID) | OPHTHALMIC | 0 refills | Status: DC

## 2023-12-14 MED ORDER — LISINOPRIL 10 MG PO TABS: 10.0000 mg | ORAL_TABLET | Freq: Every day | ORAL | 0 refills | Status: DC

## 2023-12-14 MED ORDER — SPIRONOLACTONE 25 MG PO TABS
25.0000 mg | ORAL_TABLET | Freq: Every day | ORAL | Status: DC
  Administered 2023-12-14: 25 mg via ORAL
  Filled 2023-12-14: qty 1

## 2023-12-14 NOTE — Assessment & Plan Note (Signed)
 Continue blood pressure control with amlodipine , labetalol  and lisinopril  Added spironolactone Will need close follow up as outpatient

## 2023-12-14 NOTE — Assessment & Plan Note (Addendum)
 Continue with duloxetine  and pregabalin   Seizures, continue with phenytoin .

## 2023-12-14 NOTE — Assessment & Plan Note (Signed)
 Hypopkalemia and hypophosphatemia   Renal function at the time of discharge with serum cr at 0,53 with K at 3,3 and serum bicarbonate at 33 Na 141 and Mg 2.2  P 2.6   Plan to continue diuresis with spironolactone Follow up renal function and electrolytes as outpatient.

## 2023-12-14 NOTE — Assessment & Plan Note (Addendum)
 GERD Continue sucralfate and pantoprazole  No clinical signs of recurrent bleeding

## 2023-12-14 NOTE — Assessment & Plan Note (Signed)
 Continue statin therapy

## 2023-12-14 NOTE — Assessment & Plan Note (Addendum)
 Echocardiogram with preserved LV systolic function with EF > 75%, grade I diastolic dysfunction with impaired relaxation, RV systolic function preserved, moderate pericardial effusion which is anterior to the right ventricle. Small pericardial effusion is lateral to the LV and localized along the RA. No evidence of tamponade.  No significant valvular disease. Normal size LA and RA.   Patient clinically euvolemic.  Plan to continue blood pressure control with lisinopril  and labetalol .  Add spironolactone.  Follow up with cardiology as outpatient (ambulatory referral) follow up with pericardial effusion.  Possible addition of SGLT 2 inh as outpatient.

## 2023-12-14 NOTE — Plan of Care (Signed)

## 2023-12-14 NOTE — Assessment & Plan Note (Signed)
Continue with ropinirole

## 2023-12-14 NOTE — Discharge Summary (Signed)
 Physician Discharge Summary   Patient: Renee Gutierrez MRN: [id number] DOB: [birth year]  Admit date:     12/12/2023  Discharge date: 12/14/23  Discharge Physician: Elidia Sieving Eland Lamantia   PCP: Patient, No Pcp Per   Recommendations at discharge:    Patient was placed on spironolactone and resumed lisinopril  for heart failure, continue labetalol . Possible addition of SGLT 2 inh as outpatient Follow up repeat echocardiogram as outpatient, ambulatory referral for cardiology.  Follow up renal function and electrolytes as outpatient in 7 days Follow up with Primary Care in 7 days.   Discharge Diagnoses: Principal Problem:   Hypokalemia Active Problems:   GERD (gastroesophageal reflux disease)   Anxiety   Restless leg syndrome   CKD (chronic kidney disease), stage II   Essential hypertension   Bilateral lower extremity edema   Hypophosphatemia  Resolved Problems:   * No resolved hospital problems. [facility] Course: Renee Gutierrez was admitted to the hospital with the working diagnosis of heart failure exacerbation.   63 yo female with the past medical history of hypertension, CKD, dyslipidemia, brainstem AVM sp surgery 2017, chronic left facial neuralgia, who presented with worsening lower extremity edema.  Recent hospitalization 10/16 to 12/07/23 for upper GI bleed, requiring EGD, with bipolar cautery. She had 4 units PRBC transfusion and IV fluids.  On her initial physical examination her blood pressure was 223/103, HR 96, RR 18 and 02 saturation 99% on room air Lungs with no wheezing or rhonchi, heart with S1 and S2 present and regular with no gallops or murmurs, abdomen with positive tenderness to palpation, positive lower extremity edema.   Na 146, K 2.7 Cl 105 bicarbonate 28 glucose 97 bun 5 cr 0,51 BNP 355 High sensitive troponin 17 and 15  Wbc 11,0 hgb 12.1 plt 426   Chest radiograph with hyperinflation, with no cardiomegaly, no effusions or infiltrates.   EKG 99  bpn, normal axis, normal intervals, qtc 478, sinus rhythm with left atrial enlargement, poor RR wave progression, small q wave lead I and aVL, no significant ST segment or T wave changes.   Patient was placed on IV furosemide for diuresis  Echocardiogram with preserved LV systolic function with positive grade I diastolic dysfunction, moderate pericardial effusion.  10/26 patient medically stable for discharge, will need close follow up as outpatient.   Assessment and Plan: Acute on chronic diastolic CHF (congestive heart failure) (HCC) Echocardiogram with preserved LV systolic function with EF > 75%, grade I diastolic dysfunction with impaired relaxation, RV systolic function preserved, moderate pericardial effusion which is anterior to the right ventricle. Small pericardial effusion is lateral to the LV and localized along the RA. No evidence of tamponade.  No significant valvular disease. Normal size LA and RA.   Patient clinically euvolemic.  Plan to continue blood pressure control with lisinopril  and labetalol .  Add spironolactone.  Follow up with cardiology as outpatient (ambulatory referral) follow up with pericardial effusion.  Possible addition of SGLT 2 inh as outpatient.   Essential hypertension Continue blood pressure control with amlodipine , labetalol  and lisinopril  Added spironolactone Will need close follow up as outpatient   CKD (chronic kidney disease), stage II Hypopkalemia and hypophosphatemia   Renal function at the time of discharge with serum cr at 0,53 with K at 3,3 and serum bicarbonate at 33 Na 141 and Mg 2.2  P 2.6   Plan to continue diuresis with spironolactone Follow up renal function and electrolytes as outpatient.   Hyperlipidemia Continue statin therapy  Peptic ulcer disease GERD Continue sucralfate and pantoprazole  No clinical signs of recurrent bleeding   Anxiety Continue with duloxetine .    Restless leg syndrome Continue with ropinirole          Consultants: none  Procedures performed: none   Disposition: Home Diet recommendation:  Cardiac diet DISCHARGE MEDICATION: Allergies as of 12/14/2023       Reactions   Buprenorphine Hcl Swelling   All extremities and neck and face swelled up huge according to patient   Bupropion Swelling, Other (See Comments)   Moody and depressed   Codeine Itching, Nausea And Vomiting, Nausea Only, Swelling   Sulfamethoxazole-trimethoprim Swelling, Other (See Comments)   Swelled up her eye   Nsaids Other (See Comments)   Unknown    Pregabalin  Swelling   Lyrica     Sulfa Antibiotics Other (See Comments)   Unknown    Tapentadol Nausea And Vomiting   Clindamycin Other (See Comments)   Heart burn   Clindamycin/lincomycin Rash, Other (See Comments)   Heart burn   Cymbalta  [duloxetine  Hcl] Swelling   Diclofenac Swelling   Cream    Diclofenac Sodium Swelling   Oral med   Gabapentin Swelling   Oxycodone  Nausea Only   No longer has a reaction per pt   Toradol  [ketorolac  Tromethamine ] Rash        Medication List     STOP taking these medications    diclofenac Sodium 1 % Gel Commonly known as: VOLTAREN   HYDROmorphone  4 MG tablet Commonly known as: DILAUDID    morphine  15 MG 12 hr tablet Commonly known as: MS CONTIN    morphine  15 MG tablet Commonly known as: MSIR   potassium chloride  SA 20 MEQ tablet Commonly known as: KLOR-CON  M       TAKE these medications    amLODipine  10 MG tablet Commonly known as: NORVASC  Take 1 tablet (10 mg total) by mouth daily. Start taking on: December 15, 2023 What changed:  medication strength how much to take   DULoxetine  60 MG capsule Commonly known as: CYMBALTA  Take 60 mg by mouth 2 (two) times daily.   erythromycin  ophthalmic ointment Place 1 Application into the left eye 3 (three) times daily.   Gas Relief Extra Strength 125 MG chewable tablet Generic drug: simethicone Chew 125 mg by mouth daily as needed for  flatulence.   labetalol  200 MG tablet Commonly known as: NORMODYNE  Take 1 tablet (200 mg total) by mouth 2 (two) times daily.   lisinopril  10 MG tablet Commonly known as: ZESTRIL  Take 1 tablet (10 mg total) by mouth daily. What changed:  medication strength how much to take   ondansetron  8 MG disintegrating tablet Commonly known as: ZOFRAN -ODT Take 1 tablet (8 mg total) by mouth every 6 (six) hours as needed for nausea or vomiting.   pantoprazole  40 MG tablet Commonly known as: Protonix  Take 1 tablet (40 mg total) by mouth 2 (two) times daily.   phenytoin  100 MG ER capsule Commonly known as: DILANTIN  Take 100 mg by mouth.   prednisoLONE  acetate 1 % ophthalmic suspension Commonly known as: PRED FORTE  Place 1 drop into the left eye in the morning and at bedtime.   pregabalin  50 MG capsule Commonly known as: LYRICA  Take 50 mg by mouth 3 (three) times daily.   rOPINIRole  2 MG tablet Commonly known as: REQUIP  Take 4 mg by mouth See admin instructions. Take 1 tablet in the morning and 2 tablet every evening.   spironolactone 25 MG tablet Commonly known as:  ALDACTONE Take 1 tablet (25 mg total) by mouth daily.   sucralfate 1 GM/10ML suspension Commonly known as: CARAFATE Take 10 mLs (1 g total) by mouth 4 (four) times daily -  with meals and at bedtime.        Discharge Exam: Filed Weights   12/12/23 0415 12/13/23 0500 12/14/23 0424  Weight: 46.7 kg 47.3 kg 47 kg   BP (!) 160/85 (BP Location: Right Arm)   Pulse 72   Temp (!) 97.5 F (36.4 C) (Oral)   Resp 20   Ht 4' 11 (1.499 m)   Wt 47 kg   SpO2 94%   BMI 20.93 kg/m   Patient is feeling better, has no chest pain, no palpitations, no PND, orthopnea or lower extremity edema  Neurology awake and alert ENT with mild pallor with no icterus Cardiovascular with S1 and S2 present and regular with no gallops, rubs or murmurs Respiratory with no rales or wheezing, no rhonchi  Abdomen with no distention  No  lower extremity edema   Condition at discharge: stable  The results of significant diagnostics from this hospitalization (including imaging, microbiology, ancillary and laboratory) are listed below for reference.   Imaging Studies: ECHOCARDIOGRAM COMPLETE Result Date: 12/12/2023    ECHOCARDIOGRAM REPORT   Patient Name:   Renee Gutierrez Date of Exam: 12/12/2023 Medical Rec #:  [id number]         Height:       59.0 in Accession #:    [id number]        Weight:       103.0 lb Date of Birth:  [birth year]          BSA:          1.391 m Patient Age:    63 years          BP:           139/83 mmHg Patient Gender: F                 HR:           107 bpm. Exam Location:  [facility] Procedure: 2D Echo, Strain Analysis, Cardiac Doppler and Color Doppler (Both            Spectral and Color Flow Doppler were utilized during procedure). Indications:    CHF- Acute Diastolic I50.31  History:        Patient has no prior history of Echocardiogram examinations.                 Signs/Symptoms:Chest Pain; Risk Factors:Hypertension,                 Pre-diabetes, Dyslipidemia and Current Smoker.  Sonographer:    Koleen Popper RDCS Referring Phys: [phone] CARLOS MADERA  Sonographer Comments: Image acquisition challenging due to respiratory motion. Global longitudinal strain was attempted. IMPRESSIONS  1. Left ventricular ejection fraction, by estimation, is >75%. The left ventricle has hyperdynamic function. The left ventricle has no regional wall motion abnormalities. Left ventricular diastolic parameters are consistent with Grade I diastolic dysfunction (impaired relaxation). The average left ventricular global longitudinal strain is -18.9 %. The global longitudinal strain is normal.  2. Right ventricular systolic function is normal. The right ventricular size is normal. Tricuspid regurgitation signal is inadequate for assessing PA pressure.  3. Moderate pericardial effusion is anterior to the right ventricle. Small pericardial  effusion is lateral to LV and localized along RA. There is no evidence of cardiac tamponade.  4. The mitral valve is normal in structure. No evidence of mitral valve regurgitation. No evidence of mitral stenosis.  5. The aortic valve is tricuspid. Aortic valve regurgitation is not visualized. No aortic stenosis is present.  6. The inferior vena cava is normal in size with greater than 50% respiratory variability, suggesting right atrial pressure of 3 mmHg. Comparison(s): No prior Echocardiogram. FINDINGS  Left Ventricle: Left ventricular ejection fraction, by estimation, is >75%. The left ventricle has hyperdynamic function. The left ventricle has no regional wall motion abnormalities. The average left ventricular global longitudinal strain is -18.9 %. Strain was performed and the global longitudinal strain is normal. The left ventricular internal cavity size was normal in size. There is no left ventricular hypertrophy. Left ventricular diastolic parameters are consistent with Grade I diastolic dysfunction (impaired relaxation). Normal left ventricular filling pressure. Right Ventricle: The right ventricular size is normal. No increase in right ventricular wall thickness. Right ventricular systolic function is normal. Tricuspid regurgitation signal is inadequate for assessing PA pressure. Left Atrium: Left atrial size was normal in size. Right Atrium: Right atrial size was normal in size. Pericardium: A moderately sized pericardial effusion is present. The pericardial effusion is anterior to the right ventricle. There is no evidence of cardiac tamponade. Mitral Valve: The mitral valve is normal in structure. No evidence of mitral valve regurgitation. No evidence of mitral valve stenosis. Tricuspid Valve: The tricuspid valve is normal in structure. Tricuspid valve regurgitation is not demonstrated. No evidence of tricuspid stenosis. Aortic Valve: The aortic valve is tricuspid. Aortic valve regurgitation is not  visualized. No aortic stenosis is present. Pulmonic Valve: The pulmonic valve was grossly normal. Pulmonic valve regurgitation is not visualized. No evidence of pulmonic stenosis. Aorta: The aortic root and ascending aorta are structurally normal, with no evidence of dilitation. Venous: The inferior vena cava is normal in size with greater than 50% respiratory variability, suggesting right atrial pressure of 3 mmHg. IAS/Shunts: No atrial level shunt detected by color flow Doppler. Additional Comments: 3D was performed not requiring image post processing on an independent workstation and was indeterminate.  LEFT VENTRICLE PLAX 2D LVIDd:         3.40 cm   Diastology LVIDs:         2.00 cm   LV e' medial:    5.44 cm/s LV PW:         1.10 cm   LV E/e' medial:  12.9 LV IVS:        1.10 cm   LV e' lateral:   9.25 cm/s LVOT diam:     1.80 cm   LV E/e' lateral: 7.6 LV SV:         47 LV SV Index:   34        2D Longitudinal Strain LVOT Area:     2.54 cm  2D Strain GLS Avg:     -18.9 %  RIGHT VENTRICLE             IVC RV Basal diam:  2.60 cm     IVC diam: 1.00 cm RV S prime:     20.00 cm/s TAPSE (M-mode): 2.2 cm LEFT ATRIUM             Index        RIGHT ATRIUM          Index LA diam:        2.70 cm 1.94 cm/m   RA Area:     8.90 cm LA  Vol [facility]):   28.1 ml 20.20 ml/m  RA Volume:   14.30 ml 10.28 ml/m LA Vol (A4C):   32.5 ml 23.36 ml/m LA Biplane Vol: 31.7 ml 22.79 ml/m  AORTIC VALVE LVOT Vmax:   111.00 cm/s LVOT Vmean:  72.800 cm/s LVOT VTI:    0.184 m  AORTA Ao Root diam: 3.00 cm Ao Asc diam:  2.90 cm MITRAL VALVE MV Area (PHT): 5.46 cm     SHUNTS MV Decel Time: 139 msec     Systemic VTI:  0.18 m MV E velocity: 70.20 cm/s   Systemic Diam: 1.80 cm MV A velocity: 112.00 cm/s MV E/A ratio:  0.63 Vishnu Priya Mallipeddi Electronically signed by Diannah Late Mallipeddi Signature Date/Time: 12/12/2023/4:59:17 PM    Final    DG Chest Port 1 View Result Date: 12/12/2023 EXAM: 1 VIEW(S) XRAY OF THE CHEST 12/12/2023  04:42:00 AM COMPARISON: 12/01/2023 CLINICAL HISTORY: CP, HTN, leg swelling. CP, HTN, leg/foot swelling and pain, elevated BP @ time of imaging (203/93), painful stomach palpitations. FINDINGS: LUNGS AND PLEURA: Left base atelectasis. Mild right basilar atelectasis or scarring. No pulmonary edema. No pleural effusion. Skin fold artifact right chest. No pneumothorax. HEART AND MEDIASTINUM: Aortic calcification. Moderate chronic hiatal hernia. BONES AND SOFT TISSUES: Cervical spine surgical hardware noted. No acute osseous abnormality. IMPRESSION: 1. No acute cardiopulmonary abnormality ;minor lung base atelectasis. 2. Moderate chronic hiatal hernia. Electronically signed by: Helayne Hurst MD 12/12/2023 05:58 AM EDT RP Workstation: [id number]   CT ANGIO GI BLEED Result Date: 12/04/2023 EXAM: CTA ABDOMEN AND PELVIS WITH CONTRAST 12/04/2023 09:59:01 AM TECHNIQUE: CTA images of the abdomen and pelvis with intravenous contrast. Three-dimensional MIP/volume rendered formations were performed. Automated exposure control, iterative reconstruction, and/or weight based adjustment of the mA/kV was utilized to reduce the radiation dose to as low as reasonably achievable. COMPARISON: CTA chest and CT abdomen and pelvis 11/25/2023. CLINICAL HISTORY: 63 year old female with abdominal pain, hematemesis, and suspected bleeding ulcers. FINDINGS: VASCULATURE: Stable aortoiliac atherosclerosis. Major arterial structures are patent and stable. Portal venous system appears patent on the delayed images. GI BLEED: No contrast extravasation identified into the stomach or duodenum, or elswhere. ABDOMEN/PELVIS: LOWER CHEST: Small but increased layering left pleural effusion with simple fluid density. Associated increased left lung base atelectasis or consolidation. No pericardial effusion. Right lung base more stable with curvilinear scarring or atelectasis. LIVER: The liver is unremarkable. GALLBLADDER AND BILE DUCTS: Chronic  cholecystectomy. Chronic post cholecystectomy CBD enlargement is stable. SPLEEN: The spleen is unremarkable. PANCREAS: The pancreas is unremarkable. ADRENAL GLANDS: Bilateral adrenal glands demonstrate no acute abnormality. KIDNEYS, URETERS AND BLADDER: Symmetric renal enhancement and early contrast excretion. No stones in the kidneys or ureters. No hydronephrosis. No perinephric or periureteral stranding. Urinary bladder is unremarkable. GI AND BOWEL: Large gastrocaudal hernia redemonstrated. Evidence of chronic fundoplication and herniated appearance of the fundoplication. No definite acute gastroduodenal inflammation. Non dilated large and small bowel loops. There is no bowel obstruction. No abnormal bowel wall thickening or distension. REPRODUCTIVE: Reproductive organs are unremarkable. PERITONEUM AND RETROPERITONEUM: No pneumoperitoneum. No free fluid identified. LYMPH NODES: No lymphadenopathy. BONES AND SOFT TISSUES: Chronic spine degeneration in the setting of advanced levoconvex scoliosis. No acute abnormality of the bones. No acute soft tissue abnormality. IMPRESSION: 1. 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 No active GI bleeding by CTA. No acute bowel inflammation identified. 2. Small but increased left pleural effusion and associated increased left lung base atelectasis or consolidation. Electronically signed by: Helayne Hurst  MD 12/04/2023 10:11 AM EDT RP Workstation: [id number]   DG Abd Acute W/Chest Result Date: 12/01/2023 CLINICAL DATA:  Abdominal pain. EXAM: DG ABDOMEN ACUTE WITH 1 VIEW CHEST COMPARISON:  11/25/2023 FINDINGS: Nonobstructive bowel-gas pattern. No evidence of free air. No radiopaque calculi identified. Pelvic phleboliths. Cholecystectomy clips in the right upper quadrant. Heart size and mediastinal contours are unchanged. Hiatal hernia. Linear scarring/subsegmental atelectasis again noted at the left lung base. Aortic atherosclerosis.  Levoscoliotic curvature of the thoracolumbar spine. IMPRESSION: 1. Nonobstructive bowel-gas pattern. 2. Hiatal hernia. 3. No acute cardiopulmonary findings. Linear scarring/subsegmental atelectasis again noted at the left lung base. Electronically Signed   By: Harrietta Sherry M.D.   On: 12/01/2023 14:56   CT Angio Chest PE W and/or Wo Contrast Result Date: 11/25/2023 CLINICAL DATA:  Pulmonary embolus suspected with high probability. Recent discharge from [facility] for low potassium, pneumonia, and bacterial infection of GI tract. Acute nonlocalized abdominal pain. EXAM: CT ANGIOGRAPHY CHEST CT ABDOMEN AND PELVIS WITH CONTRAST TECHNIQUE: Multidetector CT imaging of the chest was performed using the standard protocol during bolus administration of intravenous contrast. Multiplanar CT image reconstructions and MIPs were obtained to evaluate the vascular anatomy. Multidetector CT imaging of the abdomen and pelvis was performed using the standard protocol during bolus administration of intravenous contrast. RADIATION DOSE REDUCTION: This exam was performed according to the departmental dose-optimization program which includes automated exposure control, adjustment of the mA and/or kV according to patient size and/or use of iterative reconstruction technique. CONTRAST:  OMNIPAQUE  IOHEXOL  350 MG/ML SOLN COMPARISON:  Chest radiograph 11/25/2023. CT abdomen and pelvis 09/20/2023. CT chest 07/05/2011 FINDINGS: CTA CHEST FINDINGS Cardiovascular: Technically adequate study with good opacification of the central and segmental pulmonary arteries. Moderate motion artifact. No focal filling defects. No evidence of significant pulmonary embolus. Cardiac enlargement. Small pericardial effusion. Normal caliber thoracic aorta. No aortic dissection. Great vessel origins are patent. Calcification of the aorta and coronary arteries. Mediastinum/Nodes: Large esophageal hiatal hernia. Esophagus is decompressed. No  significant lymphadenopathy. Thyroid gland is unremarkable. Lungs/Pleura: Trace left pleural effusion. Atelectasis in the left base. No pneumothorax. Musculoskeletal: Postoperative changes in the cervical spine. Degenerative changes in the thoracic spine. No acute bony abnormalities. Review of the MIP images confirms the above findings. CT ABDOMEN and PELVIS FINDINGS Hepatobiliary: No focal liver abnormality is seen. Status post cholecystectomy. No biliary dilatation. Pancreas: Unremarkable. No pancreatic ductal dilatation or surrounding inflammatory changes. Spleen: Normal in size without focal abnormality. Adrenals/Urinary Tract: Adrenal glands are unremarkable. Kidneys are normal, without renal calculi, focal lesion, or hydronephrosis. Bladder is unremarkable. Stomach/Bowel: Stomach, small bowel, and colon are not abnormally distended. Under distention limits evaluation but there is evidence of wall thickening in the rectosigmoid region which likely indicates colitis. This may represent infectious or inflammatory etiologies. No abscess identified. Appendix is not visualized. Vascular/Lymphatic: Aortic atherosclerosis. No enlarged abdominal or pelvic lymph nodes. Reproductive: Status post hysterectomy. No adnexal masses. Other: No free air or free fluid in the abdomen. Abdominal wall musculature appears intact. Edema in the subcutaneous fat. Musculoskeletal: Lumbar scoliosis convex towards the left. Degenerative changes in the lumbar spine. No acute bony abnormalities. Review of the MIP images confirms the above findings. IMPRESSION: 1. No evidence of significant pulmonary embolus. 2. Large esophageal hiatal hernia. 3. Small left pleural effusion with basilar atelectasis. 4. Aortic atherosclerosis. 5. Wall thickening in the rectosigmoid colon suggesting colitis. No abscess. No proximal obstruction. Electronically Signed   By: Elsie Gravely M.D.   On: 11/25/2023 22:21  CT ABDOMEN PELVIS W CONTRAST Result  Date: 11/25/2023 CLINICAL DATA:  Pulmonary embolus suspected with high probability. Recent discharge from [facility] for low potassium, pneumonia, and bacterial infection of GI tract. Acute nonlocalized abdominal pain. EXAM: CT ANGIOGRAPHY CHEST CT ABDOMEN AND PELVIS WITH CONTRAST TECHNIQUE: Multidetector CT imaging of the chest was performed using the standard protocol during bolus administration of intravenous contrast. Multiplanar CT image reconstructions and MIPs were obtained to evaluate the vascular anatomy. Multidetector CT imaging of the abdomen and pelvis was performed using the standard protocol during bolus administration of intravenous contrast. RADIATION DOSE REDUCTION: This exam was performed according to the departmental dose-optimization program which includes automated exposure control, adjustment of the mA and/or kV according to patient size and/or use of iterative reconstruction technique. CONTRAST:  OMNIPAQUE  IOHEXOL  350 MG/ML SOLN COMPARISON:  Chest radiograph 11/25/2023. CT abdomen and pelvis 09/20/2023. CT chest 07/05/2011 FINDINGS: CTA CHEST FINDINGS Cardiovascular: Technically adequate study with good opacification of the central and segmental pulmonary arteries. Moderate motion artifact. No focal filling defects. No evidence of significant pulmonary embolus. Cardiac enlargement. Small pericardial effusion. Normal caliber thoracic aorta. No aortic dissection. Great vessel origins are patent. Calcification of the aorta and coronary arteries. Mediastinum/Nodes: Large esophageal hiatal hernia. Esophagus is decompressed. No significant lymphadenopathy. Thyroid gland is unremarkable. Lungs/Pleura: Trace left pleural effusion. Atelectasis in the left base. No pneumothorax. Musculoskeletal: Postoperative changes in the cervical spine. Degenerative changes in the thoracic spine. No acute bony abnormalities. Review of the MIP images confirms the above findings. CT ABDOMEN and PELVIS  FINDINGS Hepatobiliary: No focal liver abnormality is seen. Status post cholecystectomy. No biliary dilatation. Pancreas: Unremarkable. No pancreatic ductal dilatation or surrounding inflammatory changes. Spleen: Normal in size without focal abnormality. Adrenals/Urinary Tract: Adrenal glands are unremarkable. Kidneys are normal, without renal calculi, focal lesion, or hydronephrosis. Bladder is unremarkable. Stomach/Bowel: Stomach, small bowel, and colon are not abnormally distended. Under distention limits evaluation but there is evidence of wall thickening in the rectosigmoid region which likely indicates colitis. This may represent infectious or inflammatory etiologies. No abscess identified. Appendix is not visualized. Vascular/Lymphatic: Aortic atherosclerosis. No enlarged abdominal or pelvic lymph nodes. Reproductive: Status post hysterectomy. No adnexal masses. Other: No free air or free fluid in the abdomen. Abdominal wall musculature appears intact. Edema in the subcutaneous fat. Musculoskeletal: Lumbar scoliosis convex towards the left. Degenerative changes in the lumbar spine. No acute bony abnormalities. Review of the MIP images confirms the above findings. IMPRESSION: 1. No evidence of significant pulmonary embolus. 2. Large esophageal hiatal hernia. 3. Small left pleural effusion with basilar atelectasis. 4. Aortic atherosclerosis. 5. Wall thickening in the rectosigmoid colon suggesting colitis. No abscess. No proximal obstruction. Electronically Signed   By: Elsie Gravely M.D.   On: 11/25/2023 22:21   DG Chest Port 1 View Result Date: 11/25/2023 EXAM: 1 VIEW(S) XRAY OF THE CHEST 11/25/2023 08:15:00 PM COMPARISON: 07/06/2023 CLINICAL HISTORY: Questionable sepsis - evaluate for abnormality. FINDINGS: LUNGS AND PLEURA: No focal pulmonary opacity. No pulmonary edema. No pleural effusion. No pneumothorax. HEART AND MEDIASTINUM: Normal heart size and mediastinal contours. Atherosclerotic  calcifications. BONES AND SOFT TISSUES: Cervical fusion hardware is noted. No acute osseous abnormality. DIAPHRAGM AND UPPER ABDOMEN: A small hiatal hernia is present. IMPRESSION: 1. No acute abnormalities. Electronically signed by: Andrea Gasman MD 11/25/2023 08:25 PM EDT RP Workstation: [id number]    Microbiology: Results for orders placed or performed during the hospital encounter of 11/25/23  Resp panel by RT-PCR (RSV, Flu A&B, Covid) Anterior  Nasal Swab     Status: None   Collection Time: 11/25/23  8:07 PM   Specimen: Anterior Nasal Swab  Result Value Ref Range Status   SARS Coronavirus 2 by RT PCR NEGATIVE NEGATIVE Final    Comment: (NOTE) SARS-CoV-2 target nucleic acids are NOT DETECTED.  The SARS-CoV-2 RNA is generally detectable in upper respiratory specimens during the acute phase of infection. The lowest concentration of SARS-CoV-2 viral copies this assay can detect is 138 copies/mL. A negative result does not preclude SARS-Cov-2 infection and should not be used as the sole basis for treatment or other patient management decisions. A negative result may occur with  improper specimen collection/handling, submission of specimen other than nasopharyngeal swab, presence of viral mutation(s) within the areas targeted by this assay, and inadequate number of viral copies(<138 copies/mL). A negative result must be combined with clinical observations, patient history, and epidemiological information. The expected result is Negative.  Fact Sheet for Patients:  bloggercourse.com  Fact Sheet for Healthcare Providers:  seriousbroker.it  This test is no t yet approved or cleared by the [place]  FDA and  has been authorized for detection and/or diagnosis of SARS-CoV-2 by FDA under an Emergency Use Authorization (EUA). This EUA will remain  in effect (meaning this test can be used) for the duration of the COVID-19 declaration  under Section 564(b)(1) of the Act, 21 U.S.C.section 360bbb-3(b)(1), unless the authorization is terminated  or revoked sooner.       Influenza A by PCR NEGATIVE NEGATIVE Final   Influenza B by PCR NEGATIVE NEGATIVE Final    Comment: (NOTE) The Xpert Xpress SARS-CoV-2/FLU/RSV plus assay is intended as an aid in the diagnosis of influenza from Nasopharyngeal swab specimens and should not be used as a sole basis for treatment. Nasal washings and aspirates are unacceptable for Xpert Xpress SARS-CoV-2/FLU/RSV testing.  Fact Sheet for Patients: bloggercourse.com  Fact Sheet for Healthcare Providers: seriousbroker.it  This test is not yet approved or cleared by the [place]  FDA and has been authorized for detection and/or diagnosis of SARS-CoV-2 by FDA under an Emergency Use Authorization (EUA). This EUA will remain in effect (meaning this test can be used) for the duration of the COVID-19 declaration under Section 564(b)(1) of the Act, 21 U.S.C. section 360bbb-3(b)(1), unless the authorization is terminated or revoked.     Resp Syncytial Virus by PCR NEGATIVE NEGATIVE Final    Comment: (NOTE) Fact Sheet for Patients: bloggercourse.com  Fact Sheet for Healthcare Providers: seriousbroker.it  This test is not yet approved or cleared by the [place]  FDA and has been authorized for detection and/or diagnosis of SARS-CoV-2 by FDA under an Emergency Use Authorization (EUA). This EUA will remain in effect (meaning this test can be used) for the duration of the COVID-19 declaration under Section 564(b)(1) of the Act, 21 U.S.C. section 360bbb-3(b)(1), unless the authorization is terminated or revoked.  Performed at [facility], [address], [place], [place] [address]   Blood Culture (routine x 2)     Status: None   Collection Time: 11/25/23  8:08 PM   Specimen: BLOOD   Result Value Ref Range Status   Specimen Description BLOOD LEFT ANTECUBITAL  Final   Special Requests   Final    BOTTLES DRAWN AEROBIC AND ANAEROBIC Blood Culture adequate volume   Culture   Final    NO GROWTH 5 DAYS Performed at [facility], [address], [place], [place] [address]    Report Status 11/30/2023 FINAL  Final  Blood Culture (routine x 2)     Status: None   Collection Time: 11/25/23  9:34 PM   Specimen: BLOOD  Result Value Ref Range Status   Specimen Description BLOOD RIGHT ANTECUBITAL  Final   Special Requests   Final    BOTTLES DRAWN AEROBIC AND ANAEROBIC Blood Culture adequate volume   Culture   Final    NO GROWTH 5 DAYS Performed at [facility], [address], [place], [place] [address]    Report Status 11/30/2023 FINAL  Final    Labs: CBC: Recent Labs  Lab 12/12/23 0435  WBC 11.0*  NEUTROABS 7.5  HGB 12.1  HCT 38.0  MCV 86.0  PLT 426*   Basic Metabolic Panel: Recent Labs  Lab 12/12/23 0435 12/12/23 0722 12/12/23 1942 12/13/23 0412 12/13/23 2008 12/14/23 0413  NA 146*  --   --  143  --  141  K 2.7*  --   --  2.5* 3.0* 3.3*  CL 105  --   --  99  --  101  CO2 28  --   --  33*  --  33*  GLUCOSE 97  --   --  110*  --  89  BUN 5*  --   --  <5*  --  <5*  CREATININE 0.51  --   --  0.49  --  0.53  CALCIUM  9.3  --   --  9.0  --  8.8*  MG  --  1.7  --   --  2.2  --   PHOS  --  1.5* 2.6  --   --   --    Liver Function Tests: No results for input(s): AST, ALT, ALKPHOS, BILITOT, PROT, ALBUMIN in the last 168 hours. CBG: No results for input(s): GLUCAP in the last 168 hours.  Discharge time spent: greater than 30 minutes.  Signed: Elidia Toribio Furnace, MD [facility] 12/14/2023

## 2023-12-14 NOTE — Hospital Course (Addendum)
 Mrs. Locascio was admitted to the hospital with the working diagnosis of heart failure exacerbation.   63 yo female with the past medical history of hypertension, CKD, dyslipidemia, brainstem AVM sp surgery 2017, chronic left facial neuralgia, who presented with worsening lower extremity edema.  Recent hospitalization 10/16 to 12/07/23 for upper GI bleed, requiring EGD, with bipolar cautery. She had 4 units PRBC transfusion and IV fluids.  On her initial physical examination her blood pressure was 223/103, HR 96, RR 18 and 02 saturation 99% on room air Lungs with no wheezing or rhonchi, heart with S1 and S2 present and regular with no gallops or murmurs, abdomen with positive tenderness to palpation, positive lower extremity edema.   Na 146, K 2.7 Cl 105 bicarbonate 28 glucose 97 bun 5 cr 0,51 BNP 355 High sensitive troponin 17 and 15  Wbc 11,0 hgb 12.1 plt 426   Chest radiograph with hyperinflation, with no cardiomegaly, no effusions or infiltrates.   EKG 99 bpn, normal axis, normal intervals, qtc 478, sinus rhythm with left atrial enlargement, poor RR wave progression, small q wave lead I and aVL, no significant ST segment or T wave changes.   Patient was placed on IV furosemide for diuresis  Echocardiogram with preserved LV systolic function with positive grade I diastolic dysfunction, moderate pericardial effusion.  10/26 patient medically stable for discharge, will need close follow up as outpatient.

## 2023-12-16 ENCOUNTER — Ambulatory Visit (INDEPENDENT_AMBULATORY_CARE_PROVIDER_SITE_OTHER): Admitting: Gastroenterology

## 2023-12-16 NOTE — Telephone Encounter (Signed)
 Agree with this, the rejection does not make any sense

## 2023-12-16 NOTE — Telephone Encounter (Signed)
 Referral faxed to [facility]

## 2023-12-16 NOTE — Telephone Encounter (Signed)
 Referral to [facility] denied Rejection Reason - Unapproved Service - Pt needs to see an ENT

## 2023-12-17 ENCOUNTER — Encounter (INDEPENDENT_AMBULATORY_CARE_PROVIDER_SITE_OTHER): Payer: Self-pay | Admitting: Gastroenterology

## 2023-12-17 ENCOUNTER — Ambulatory Visit (INDEPENDENT_AMBULATORY_CARE_PROVIDER_SITE_OTHER): Admitting: Gastroenterology

## 2023-12-17 ENCOUNTER — Telehealth (INDEPENDENT_AMBULATORY_CARE_PROVIDER_SITE_OTHER): Payer: Self-pay

## 2023-12-17 VITALS — BP 186/95 | HR 112 | Temp 97.8°F | Ht 59.0 in | Wt 98.0 lb

## 2023-12-17 DIAGNOSIS — D500 Iron deficiency anemia secondary to blood loss (chronic): Secondary | ICD-10-CM | POA: Insufficient documentation

## 2023-12-17 DIAGNOSIS — K250 Acute gastric ulcer with hemorrhage: Secondary | ICD-10-CM

## 2023-12-17 NOTE — Progress Notes (Signed)
 Toribio Fortune, M.D. Gastroenterology & Hepatology [facility] Gastroenterology [address] [place], [place] [address]  Primary Care Physician: Patient, No Pcp Per No address on file  I will communicate my assessment and recommendations to the referring MD via EMR.  Problems: Persistent cardia ulcer Symptomatic anemia with hematemesis Large hiatal hernia with herniation of Nissen fundoplication area  History of Present Illness: Renee Gutierrez is a 63 y.o. 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  Patient was admitted to the hospital again on 12/04/2023 after presenting vomiting, hematemesis and epigastric pain.  Had previous similar admission on 11/27/2023, for which she underwent an EGD by Dr. Cindie that showed presence of esophagitis and a gastric ulcer with hematin spot which was injected and covered with Purastat.  Patient was readmitted with similar symptoms than on previous admissions.  Hemoglobin went down to 7.6.  CT angio bleeding protocol was negative for ongoing bleeding although it showed presence of large hiatal hernia with herniation.  Patient underwent an EGD on 12/05/2023 that showed a 3 cm paraesophageal hernia.,  There was presence of a nonbleeding cratered gastric ulcer measuring at least 2 cm in size with presence of a nonbleeding visible vessel that was ablated with bipolar probe.  This ulcer was located in the cardia and the major curvature side, specifically in the area that was herniated.  Patient was continued on PPI twice daily and Carafate.  She was also kept on Compazine  for nausea.  Referral  was sent for evaluation of hiatal hernia at [facility].  We received a message yesterday from [facility] stating that the hiatal hernia repair/revision visit was rejected as the patient had to be evaluated by ENT instead.  We have resent the referral for evaluation at [facility].  Patient reports that since she left the hospital, she has controlled her nausea with Compazine  1-2 times a week. Has not taken Zofran  or Phenergan . Stools are watery, moves her bowels 2-3 times per day, which is not usual for her. She States her abdominal pain is still present in the epigastric area. No melena or hematochezia.   She is currently on Protonix  40 twice a day, as well as Carafate with every meal.   The patient denies having any nausea, vomiting, fever, chills, hematemesis, abdominal distention, abdominal pain, diarrhea, jaundice, pruritus. Has lost more weight since hospitalization.  Last EGD: As above Last Colonoscopy: possibly 30 years ago.  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    Past Surgical History: Past Surgical History:  Procedure Laterality Date   ABDOMINAL HYSTERECTOMY     ABDOMINAL SURGERY     tumor removed   APPENDECTOMY  BRAIN SURGERY     to fix AVM   CEREBRAL ANEURYSM REPAIR     CERVICAL FUSION     CESAREAN SECTION     CHOLECYSTECTOMY     ESOPHAGEAL DILATION N/A 11/28/2023   Procedure: DILATION, ESOPHAGUS;  Surgeon: Cindie Carlin POUR, DO;  [facility];  Service: Endoscopy;  Laterality: N/A;   ESOPHAGOGASTRODUODENOSCOPY  N/A 11/28/2023   Procedure: EGD (ESOPHAGOGASTRODUODENOSCOPY);  Surgeon: Cindie Carlin POUR, DO;  Location: [facility];  Service: Endoscopy;  Laterality: N/A;   ESOPHAGOGASTRODUODENOSCOPY N/A 12/05/2023   Procedure: EGD (ESOPHAGOGASTRODUODENOSCOPY);  Surgeon: Eartha Flavors, Toribio, MD;  Location: [facility];  Service: Gastroenterology;  Laterality: N/A;   LUNG SURGERY     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    Family History: Family History  Problem Relation Age of Onset   Throat cancer Mother    Bladder Cancer Father    Liver cancer Father    Colon cancer Neg Hx     Social History: Social History   Tobacco Use  Smoking Status Every Day   Types: Cigarettes  Smokeless Tobacco Never  Tobacco Comments   1 cigarette daily    Social History   Substance and Sexual Activity  Alcohol Use No   Social History   Substance and Sexual Activity  Drug Use No    Allergies: Allergies  Allergen Reactions   Buprenorphine Hcl Swelling    All extremities and neck and face swelled up huge according to patient   Bupropion Swelling and Other (See Comments)    Moody and depressed   Codeine Itching, Nausea And Vomiting, Nausea Only and Swelling   Sulfamethoxazole-Trimethoprim Swelling and Other (See Comments)    Swelled up her eye   Nsaids Other (See Comments)    Unknown    Pregabalin  Swelling    Lyrica     Sulfa Antibiotics Other (See Comments)    Unknown    Tapentadol Nausea And Vomiting   Clindamycin Other (See Comments)    Heart burn   Clindamycin/Lincomycin Rash and Other (See Comments)    Heart burn   Cymbalta  [Duloxetine  Hcl] Swelling   Diclofenac Swelling    Cream    Diclofenac Sodium Swelling    Oral med   Gabapentin Swelling   Oxycodone  Nausea Only    No longer has a reaction per pt   Toradol  [Ketorolac  Tromethamine ] Rash    Medications: Current Outpatient Medications  Medication Sig Dispense Refill   amLODipine   (NORVASC ) 10 MG tablet Take 1 tablet (10 mg total) by mouth daily. 30 tablet 0   DULoxetine  (CYMBALTA ) 60 MG capsule Take 60 mg by mouth 2 (two) times daily.     erythromycin  ophthalmic ointment Place 1 Application into the left eye 3 (three) times daily. 90 g 0   labetalol  (NORMODYNE ) 200 MG tablet Take 1 tablet (200 mg total) by mouth 2 (two) times daily. 60 tablet 0   lisinopril  (ZESTRIL ) 10 MG tablet Take 1 tablet (10 mg total) by mouth daily. 30 tablet 0   morphine  (MS CONTIN ) 15 MG 12 hr tablet Take 15 mg by mouth every 12 (twelve) hours.     morphine  (MSIR) 15 MG tablet Take 15 mg by mouth every 4 (four) hours as needed for severe pain (pain score 7-10).     ondansetron  (ZOFRAN -ODT) 8 MG disintegrating tablet Take 1 tablet (8 mg total) by mouth every 6 (six) hours as needed for nausea  or vomiting. 20 tablet 0   pantoprazole  (PROTONIX ) 40 MG tablet Take 1 tablet (40 mg total) by mouth 2 (two) times daily. 60 tablet 2   phenytoin  (DILANTIN ) 100 MG ER capsule Take 100 mg by mouth. (Patient taking differently: Take 100 mg by mouth 2 (two) times daily.)     prednisoLONE  acetate (PRED FORTE ) 1 % ophthalmic suspension Place 1 drop into the left eye in the morning and at bedtime. 3 mL 0   pregabalin  (LYRICA ) 50 MG capsule Take 50 mg by mouth 3 (three) times daily.     rOPINIRole  (REQUIP ) 2 MG tablet Take 4 mg by mouth See admin instructions. Take 1 tablet in the morning and 2 tablet every evening.  0   simethicone (GAS RELIEF EXTRA STRENGTH) 125 MG chewable tablet Chew 125 mg by mouth daily as needed for flatulence.      spironolactone (ALDACTONE) 25 MG tablet Take 1 tablet (25 mg total) by mouth daily. 30 tablet 0   sucralfate (CARAFATE) 1 GM/10ML suspension Take 10 mLs (1 g total) by mouth 4 (four) times daily -  with meals and at bedtime. 473 mL 3   No current facility-administered medications for this visit.    Review of Systems: GENERAL: negative for malaise, night sweats HEENT: No changes  in hearing or vision, no nose bleeds or other nasal problems. NECK: Negative for lumps, goiter, pain and significant neck swelling RESPIRATORY: Negative for cough, wheezing CARDIOVASCULAR: Negative for chest pain, leg swelling, palpitations, orthopnea GI: SEE HPI MUSCULOSKELETAL: Negative for joint pain or swelling, back pain, and muscle pain. SKIN: Negative for lesions, rash PSYCH: Negative for sleep disturbance, mood disorder and recent psychosocial stressors. HEMATOLOGY Negative for prolonged bleeding, bruising easily, and swollen nodes. ENDOCRINE: Negative for cold or heat intolerance, polyuria, polydipsia and goiter. NEURO: negative for tremor, gait imbalance, syncope and seizures. The remainder of the review of systems is noncontributory.   Physical Exam: BP (!) 186/95 (BP Location: Right Arm, Patient Position: Sitting, Cuff Size: Normal)   Pulse (!) 112   Temp 97.8 F (36.6 C) (Temporal)   Ht 4' 11 (1.499 m)   Wt 98 lb (44.5 kg)   BMI 19.79 kg/m  GENERAL: The patient is AO x3, in no acute distress. HEENT: Head is normocephalic and atraumatic. EOMI are intact. Mouth is well hydrated and without lesions. NECK: Supple. No masses LUNGS: Clear to auscultation. No presence of rhonchi/wheezing/rales. Adequate chest expansion HEART: RRR, normal s1 and s2. ABDOMEN: Tender to palpation in the epigastric area, no guarding, no peritoneal signs, and nondistended. BS +. No masses. EXTREMITIES: Without any cyanosis, clubbing, rash, lesions or edema. NEUROLOGIC: AOx3, no focal motor deficit. SKIN: no jaundice, no rashes  Imaging/Labs: as above  I personally reviewed and interpreted the available labs, imaging and endoscopic files.  Impression and Plan: Renee Gutierrez is a 63 y.o. 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  Patient was recently hospitalized for upper gastrointestinal bleeding due to large ulcer in cardia located at the hiatal hernia sac, which required endoscopic management.  Since hospitalization, medical treatment with PPI, Carafate and Compazine  has led to significant improvement of symptoms, although she is still present abdominal pain and discomfort.  We discussed  that given the location of the ulceration and size, I suspect she will really benefit from surgical evaluation as the herniation may be favoring this unusual location for ulceration.  As [facility] did not accept her case, we have referred her for evaluation at [facility].  Encouraged her to follow closely with them.  For now, we will continue with same doses of pantoprazole  and Carafate, she should avoid using NSAIDs and high-dose aspirin .  She needs to continue taking Compazine  as needed as this has led to significant improvement of her symptoms while having the lowest risk for QTc interval prolongation.  Will check CBC now to make sure it is stable.  Finally, patient is due for colorectal cancer screening, we will schedule her for colonoscopy.  -Schedule colonoscopy - Check CBC -Patient will need to follow-up with surgeon at [facility] for hiatal hernia repair  -continue pantoprazole  40 mg twice a day -Continue sucralfate 1 g every 8 hours with meals -Avoid using high dose aspirin  including Goody/BC powders, NSAIDs such as Aleve , ibuprofen, naproxen , Motrin, Voltaren or Advil (even the topical ones) -Continue Compazine  every 6 days as needed for nausea/vomiting  All questions were answered.      Toribio Fortune, MD Gastroenterology and Hepatology [facility] Gastroenterology

## 2023-12-17 NOTE — Telephone Encounter (Signed)
 [facility] pt to schedule colonoscopy. No answer, LVM.

## 2023-12-17 NOTE — Patient Instructions (Addendum)
 Schedule colonoscopy Perform blood workup Please follow-up with surgeon at [facility] -you should expect a call from their office (referral has been already sent) Continue pantoprazole  40 mg twice a day Continue sucralfate 1 g every 8 hours with meals Avoid using high dose aspirin  including Goody/BC powders, NSAIDs such as Aleve , ibuprofen, naproxen , Motrin, Voltaren or Advil (even the topical ones) Continue Compazine  every 6 days as needed for nausea/vomiting

## 2023-12-18 MED ORDER — NA SULFATE-K SULFATE-MG SULF 17.5-3.13-1.6 GM/177ML PO SOLN: ORAL | 0 refills | Status: DC

## 2023-12-18 NOTE — Telephone Encounter (Signed)
 PA on [employer] for [facility]: Prior authorization is not required for this code.

## 2023-12-18 NOTE — Addendum Note (Signed)
 Addended by: DALLIE LIONEL RAMAN on: 12/18/2023 08:34 AM   Modules accepted: Orders

## 2023-12-18 NOTE — Telephone Encounter (Signed)
 Spoke with patient, scheduled [facility] for 01/20/2024 at 8:45am. Rx sent to pharmacy. Instructions mailed.

## 2023-12-21 ENCOUNTER — Other Ambulatory Visit: Payer: Self-pay

## 2023-12-21 ENCOUNTER — Emergency Department (HOSPITAL_COMMUNITY)

## 2023-12-21 ENCOUNTER — Emergency Department (HOSPITAL_COMMUNITY)
Admission: EM | Admit: 2023-12-21 | Discharge: 2023-12-21 | Discharge status: Against Medical Advice | Attending: Emergency Medicine | Admitting: Emergency Medicine

## 2023-12-21 DIAGNOSIS — R0602 Shortness of breath: Secondary | ICD-10-CM | POA: Insufficient documentation

## 2023-12-21 DIAGNOSIS — Z5321 Procedure and treatment not carried out due to patient leaving prior to being seen by health care provider: Secondary | ICD-10-CM | POA: Insufficient documentation

## 2023-12-21 DIAGNOSIS — R079 Chest pain, unspecified: Secondary | ICD-10-CM | POA: Diagnosis present

## 2023-12-21 LAB — BASIC METABOLIC PANEL WITH GFR
Anion gap: 10 (ref 5–15)
BUN: 12 mg/dL (ref 8–23)
CO2: 27 mmol/L (ref 22–32)
Calcium: 8.9 mg/dL (ref 8.9–10.3)
Chloride: 105 mmol/L (ref 98–111)
Creatinine, Ser: 0.63 mg/dL (ref 0.44–1.00)
GFR, Estimated: >60 mL/min (ref >60)
Glucose, Bld: 79 mg/dL (ref 70–99)
Potassium: 3.5 mmol/L (ref 3.5–5.1)
Sodium: 142 mmol/L (ref 135–145)

## 2023-12-21 LAB — CBC
HCT: 35.5 % — ABNORMAL LOW (ref 36.0–46.0)
Hemoglobin: 10.9 g/dL — ABNORMAL LOW (ref 12.0–15.0)
MCH: 27 pg (ref 26.0–34.0)
MCHC: 30.7 g/dL (ref 30.0–36.0)
MCV: 88.1 fL (ref 80.0–100.0)
Platelets: 421 K/uL — ABNORMAL HIGH (ref 150–400)
RBC: 4.03 MIL/uL (ref 3.87–5.11)
RDW: 15 % (ref 11.5–15.5)
WBC: 11 K/uL — ABNORMAL HIGH (ref 4.0–10.5)
nRBC: 0 % (ref 0.0–0.2)

## 2023-12-21 LAB — PROTIME-INR
INR: 0.9 (ref 0.8–1.2)
Prothrombin Time: 12.3 s (ref 11.4–15.2)

## 2023-12-21 LAB — TROPONIN T, HIGH SENSITIVITY: Troponin T High Sensitivity: <15 ng/L (ref 0–19)

## 2023-12-21 NOTE — ED Triage Notes (Signed)
 Pt arrives POV from home. C/c chest pain SOB. Pain started last night. CP radiating to L arm. Reports multiple other pain location complaints.

## 2023-12-22 ENCOUNTER — Telehealth (INDEPENDENT_AMBULATORY_CARE_PROVIDER_SITE_OTHER): Payer: Self-pay

## 2023-12-22 NOTE — Telephone Encounter (Signed)
 Patient calling today due to Mid Epigastric and RUQ pain. Asking that we send in something for pain.  Patient with history of acute gastric ulcer, and was seen last week by Dr. Eartha on December 17, 2023. Patient states she is in pretty severe pain. I advised given her history and the severity of the pain she would need to proceed to the nearest Ed. Patient says she went to [facility] Ed yesterday and she waited there for three hours she was not going back there. She also says even if she wanted to she couldn't as she could not afford the gas to get there. Patient denies any nausea, Vomiting, dark or bloody stools, fevers, diarrhea or constipation. She reports her stools are a normal color and are solid. She is currently taking Pantoprazole  40 mg bid, Carafate 1 gm QID, and is not taking any NSAID'S. Patient uses [place], [place] off of [address]. Please advise.

## 2023-12-22 NOTE — Telephone Encounter (Signed)
 I spoke with Renee Gutierrez regarding this and per [facility] patient needs to go back to the Ed given her history of gastric ulcer with visible vessel and now having severe abdominal pain. I called the patient at 4:23 pm to let her know the recommendations per [facility], No answer I got the patient VM, and left a detailed message that per [facility], the patient would need to report back to the Ed for further evaluation and I asked that she please call the office back to let me know she did receive this message.

## 2023-12-23 ENCOUNTER — Observation Stay (HOSPITAL_COMMUNITY)
Admission: EM | Admit: 2023-12-23 | Discharge: 2023-12-24 | Disposition: A | Discharge status: Home / Self Care | Attending: Family Medicine | Admitting: Family Medicine

## 2023-12-23 ENCOUNTER — Observation Stay (HOSPITAL_COMMUNITY)

## 2023-12-23 ENCOUNTER — Encounter (HOSPITAL_COMMUNITY): Payer: Self-pay | Admitting: Emergency Medicine

## 2023-12-23 ENCOUNTER — Telehealth: Payer: Self-pay | Admitting: *Deleted

## 2023-12-23 ENCOUNTER — Other Ambulatory Visit: Payer: Self-pay

## 2023-12-23 DIAGNOSIS — F17210 Nicotine dependence, cigarettes, uncomplicated: Secondary | ICD-10-CM | POA: Diagnosis not present

## 2023-12-23 DIAGNOSIS — R112 Nausea with vomiting, unspecified: Principal | ICD-10-CM | POA: Diagnosis present

## 2023-12-23 DIAGNOSIS — R1010 Upper abdominal pain, unspecified: Principal | ICD-10-CM | POA: Insufficient documentation

## 2023-12-23 DIAGNOSIS — Z79899 Other long term (current) drug therapy: Secondary | ICD-10-CM | POA: Diagnosis not present

## 2023-12-23 DIAGNOSIS — I3139 Other pericardial effusion (noninflammatory): Secondary | ICD-10-CM | POA: Diagnosis not present

## 2023-12-23 DIAGNOSIS — I10 Essential (primary) hypertension: Secondary | ICD-10-CM | POA: Diagnosis not present

## 2023-12-23 DIAGNOSIS — J90 Pleural effusion, not elsewhere classified: Secondary | ICD-10-CM | POA: Insufficient documentation

## 2023-12-23 DIAGNOSIS — Z7982 Long term (current) use of aspirin: Secondary | ICD-10-CM | POA: Insufficient documentation

## 2023-12-23 LAB — CBC WITH DIFFERENTIAL/PLATELET
Abs Immature Granulocytes: 0.08 K/uL — ABNORMAL HIGH (ref 0.00–0.07)
Basophils Absolute: 0.1 K/uL (ref 0.0–0.1)
Basophils Relative: 1 %
Eosinophils Absolute: 0 K/uL (ref 0.0–0.5)
Eosinophils Relative: 0 %
HCT: 36.7 % (ref 36.0–46.0)
Hemoglobin: 11.8 g/dL — ABNORMAL LOW (ref 12.0–15.0)
Immature Granulocytes: 1 %
Lymphocytes Relative: 10 %
Lymphs Abs: 1.6 K/uL (ref 0.7–4.0)
MCH: 27.2 pg (ref 26.0–34.0)
MCHC: 32.2 g/dL (ref 30.0–36.0)
MCV: 84.6 fL (ref 80.0–100.0)
Monocytes Absolute: 0.7 K/uL (ref 0.1–1.0)
Monocytes Relative: 4 %
Neutro Abs: 13.5 K/uL — ABNORMAL HIGH (ref 1.7–7.7)
Neutrophils Relative %: 84 %
Platelets: 480 K/uL — ABNORMAL HIGH (ref 150–400)
RBC: 4.34 MIL/uL (ref 3.87–5.11)
RDW: 14.9 % (ref 11.5–15.5)
WBC: 16 K/uL — ABNORMAL HIGH (ref 4.0–10.5)
nRBC: 0 % (ref 0.0–0.2)

## 2023-12-23 LAB — COMPREHENSIVE METABOLIC PANEL WITH GFR
ALT: 9 U/L (ref 0–44)
AST: 15 U/L (ref 15–41)
Albumin: 3.7 g/dL (ref 3.5–5.0)
Alkaline Phosphatase: 84 U/L (ref 38–126)
Anion gap: 12 (ref 5–15)
BUN: 9 mg/dL (ref 8–23)
CO2: 28 mmol/L (ref 22–32)
Calcium: 9.9 mg/dL (ref 8.9–10.3)
Chloride: 103 mmol/L (ref 98–111)
Creatinine, Ser: 0.85 mg/dL (ref 0.44–1.00)
GFR, Estimated: >60 mL/min (ref >60)
Glucose, Bld: 109 mg/dL — ABNORMAL HIGH (ref 70–99)
Potassium: 2.9 mmol/L — ABNORMAL LOW (ref 3.5–5.1)
Sodium: 143 mmol/L (ref 135–145)
Total Bilirubin: <0.2 mg/dL (ref 0.0–1.2)
Total Protein: 7.3 g/dL (ref 6.5–8.1)

## 2023-12-23 LAB — MAGNESIUM: Magnesium: 2.1 mg/dL (ref 1.7–2.4)

## 2023-12-23 LAB — LIPASE, BLOOD: Lipase: 15 U/L (ref 11–51)

## 2023-12-23 MED ORDER — PROCHLORPERAZINE EDISYLATE 10 MG/2ML IJ SOLN
10.0000 mg | Freq: Four times a day (QID) | INTRAMUSCULAR | Status: DC | PRN
Start: 2023-12-23 — End: 2023-12-24
  Administered 2023-12-23 – 2023-12-24 (×2): 10 mg via INTRAVENOUS
  Filled 2023-12-23 (×2): qty 2

## 2023-12-23 MED ORDER — MORPHINE SULFATE 15 MG PO TABS: 15.0000 mg | ORAL_TABLET | ORAL | Status: DC | PRN

## 2023-12-23 MED ORDER — LISINOPRIL 10 MG PO TABS
10.0000 mg | ORAL_TABLET | Freq: Every day | ORAL | Status: DC
  Administered 2023-12-23 – 2023-12-24 (×2): 10 mg via ORAL
  Filled 2023-12-23 (×2): qty 1

## 2023-12-23 MED ORDER — METOCLOPRAMIDE HCL 5 MG/ML IJ SOLN
10.0000 mg | Freq: Once | INTRAMUSCULAR | Status: AC
  Administered 2023-12-23: 10 mg via INTRAVENOUS
  Filled 2023-12-23: qty 2

## 2023-12-23 MED ORDER — LABETALOL HCL 200 MG PO TABS
200.0000 mg | ORAL_TABLET | Freq: Two times a day (BID) | ORAL | Status: DC
  Administered 2023-12-23 – 2023-12-24 (×2): 200 mg via ORAL
  Filled 2023-12-23 (×2): qty 1

## 2023-12-23 MED ORDER — LABETALOL HCL 5 MG/ML IV SOLN
10.0000 mg | INTRAVENOUS | Status: DC | PRN
  Administered 2023-12-23 – 2023-12-24 (×2): 10 mg via INTRAVENOUS
  Filled 2023-12-23 (×2): qty 4

## 2023-12-23 MED ORDER — SIMETHICONE 80 MG PO CHEW
80.0000 mg | CHEWABLE_TABLET | Freq: Four times a day (QID) | ORAL | Status: DC | PRN
Start: 2023-12-23 — End: 2023-12-24

## 2023-12-23 MED ORDER — LORAZEPAM 2 MG/ML IJ SOLN: 0.5000 mg | Freq: Four times a day (QID) | INTRAMUSCULAR | Status: DC | PRN

## 2023-12-23 MED ORDER — SODIUM CHLORIDE 0.9 % IV SOLN: INTRAVENOUS | Status: AC

## 2023-12-23 MED ORDER — SODIUM CHLORIDE 0.9 % IV BOLUS
1000.0000 mL | Freq: Once | INTRAVENOUS | Status: AC
  Administered 2023-12-23: 1000 mL via INTRAVENOUS

## 2023-12-23 MED ORDER — DROPERIDOL 2.5 MG/ML IJ SOLN
2.5000 mg | Freq: Once | INTRAMUSCULAR | Status: AC
  Administered 2023-12-23: 2.5 mg via INTRAVENOUS
  Filled 2023-12-23: qty 2

## 2023-12-23 MED ORDER — PANTOPRAZOLE SODIUM 40 MG IV SOLR
40.0000 mg | Freq: Two times a day (BID) | INTRAVENOUS | Status: DC
  Administered 2023-12-23 – 2023-12-24 (×3): 40 mg via INTRAVENOUS
  Filled 2023-12-23 (×3): qty 10

## 2023-12-23 MED ORDER — POTASSIUM CHLORIDE 10 MEQ/100ML IV SOLN
10.0000 meq | INTRAVENOUS | Status: AC
  Administered 2023-12-23 (×6): 10 meq via INTRAVENOUS
  Filled 2023-12-23 (×6): qty 100

## 2023-12-23 MED ORDER — HYDRALAZINE HCL 20 MG/ML IJ SOLN
10.0000 mg | Freq: Four times a day (QID) | INTRAMUSCULAR | Status: DC | PRN
  Administered 2023-12-23: 10 mg via INTRAVENOUS
  Filled 2023-12-23: qty 1

## 2023-12-23 MED ORDER — PHENYTOIN SODIUM EXTENDED 100 MG PO CAPS
100.0000 mg | ORAL_CAPSULE | Freq: Two times a day (BID) | ORAL | Status: DC
  Administered 2023-12-23 – 2023-12-24 (×2): 100 mg via ORAL
  Filled 2023-12-23 (×2): qty 1

## 2023-12-23 MED ORDER — LORAZEPAM BOLUS VIA INFUSION: 0.5000 mg | Freq: Four times a day (QID) | INTRAVENOUS | Status: DC | PRN

## 2023-12-23 MED ORDER — LABETALOL HCL 5 MG/ML IV SOLN
10.0000 mg | Freq: Once | INTRAVENOUS | Status: AC
  Administered 2023-12-23: 10 mg via INTRAVENOUS
  Filled 2023-12-23: qty 4

## 2023-12-23 MED ORDER — AMLODIPINE BESYLATE 5 MG PO TABS
10.0000 mg | ORAL_TABLET | Freq: Every day | ORAL | Status: DC
  Administered 2023-12-23 – 2023-12-24 (×2): 10 mg via ORAL
  Filled 2023-12-23 (×2): qty 2

## 2023-12-23 MED ORDER — HYDROMORPHONE HCL 1 MG/ML IJ SOLN
0.5000 mg | INTRAMUSCULAR | Status: DC | PRN
  Administered 2023-12-23 – 2023-12-24 (×8): 1 mg via INTRAVENOUS
  Filled 2023-12-23 (×8): qty 1

## 2023-12-23 MED ORDER — DIPHENHYDRAMINE HCL 50 MG/ML IJ SOLN
25.0000 mg | Freq: Once | INTRAMUSCULAR | Status: AC
  Administered 2023-12-23: 25 mg via INTRAVENOUS
  Filled 2023-12-23: qty 1

## 2023-12-23 MED ORDER — ROPINIROLE HCL 1 MG PO TABS
2.0000 mg | ORAL_TABLET | ORAL | Status: AC
  Administered 2023-12-23: 2 mg via ORAL
  Filled 2023-12-23: qty 2

## 2023-12-23 MED ORDER — IOHEXOL 300 MG/ML  SOLN
100.0000 mL | Freq: Once | INTRAMUSCULAR | Status: AC | PRN
  Administered 2023-12-23: 100 mL via INTRAVENOUS

## 2023-12-23 MED ORDER — LORAZEPAM 2 MG/ML IJ SOLN
0.5000 mg | Freq: Once | INTRAMUSCULAR | Status: AC
  Administered 2023-12-23: 0.5 mg via INTRAVENOUS
  Filled 2023-12-23: qty 1

## 2023-12-23 MED ORDER — SENNA 8.6 MG PO TABS
1.0000 | ORAL_TABLET | Freq: Two times a day (BID) | ORAL | Status: DC
  Administered 2023-12-23 – 2023-12-24 (×2): 8.6 mg via ORAL
  Filled 2023-12-23 (×2): qty 1

## 2023-12-23 MED ORDER — HYDROMORPHONE HCL 1 MG/ML IJ SOLN
1.0000 mg | Freq: Once | INTRAMUSCULAR | Status: AC
  Administered 2023-12-23: 1 mg via INTRAVENOUS
  Filled 2023-12-23: qty 1

## 2023-12-23 NOTE — ED Notes (Signed)
 Patient screaming obscenities in room.  Patient assured that we are helping her.  Patient continues to scream.

## 2023-12-23 NOTE — Care Management CC44 (Signed)
 Condition Code 44 Documentation Completed  Patient Details  Name: Renee Gutierrez MRN: [id number] Date of Birth: 1960/03/26   Condition Code 44 given:  Yes Patient signature on Condition Code 44 notice:  Yes Documentation of 2 MD's agreement:  Yes Code 44 added to claim:  Yes    Mcarthur Saddie Kim, LCSW 12/23/2023, 3:06 PM

## 2023-12-23 NOTE — TOC Initial Note (Signed)
 Transition of Care [facility]) - Initial/Assessment Note    Patient Details  Name: Renee Gutierrez MRN: [id number] Date of Birth: 1961/01/17  Transition of Care [facility]) CM/SW Contact:    Noreen KATHEE Cleotilde ISRAEL Phone Number: 12/23/2023, 10:12 AM  Clinical Narrative:                  CSW spoke with patient and assessed her. Patient reports that it is her and her husband in the home and that she is independent. Patient reports that she have never driven before and husband is very supportive of her needs. She reports having a walker for equipment at home and no in home care services. CSW will continue to follow.    Expected Discharge Plan: Home/Self Care Barriers to Discharge: Continued Medical Work up   Patient Goals and CMS Choice Patient states their goals for this hospitalization and ongoing recovery are:: return back home CMS Medicare.gov Compare Post Acute Care list provided to:: Patient Choice offered to / list presented to : Patient      Expected Discharge Plan and Services     Post Acute Care Choice: Durable Medical Equipment Living arrangements for the past 2 months: Single Family Home                                      Prior Living Arrangements/Services Living arrangements for the past 2 months: Single Family Home Lives with:: Spouse Patient language and need for interpreter reviewed:: Yes Do you feel safe going back to the place where you live?: Yes      Need for Family Participation in Patient Care: Yes (Comment) Care giver support system in place?: Yes (comment) Current home services: DME Criminal Activity/Legal Involvement Pertinent to Current Situation/Hospitalization: No - Comment as needed  Activities of Daily Living      Permission Sought/Granted      Share Information with NAME: Ysabella     Permission granted to share info w Relationship: Patient     Emotional Assessment Appearance:: Appears stated age Attitude/Demeanor/Rapport:  Self-Confident Affect (typically observed): Accepting Orientation: : Oriented to Self, Oriented to Place, Oriented to  Time, Oriented to Situation Alcohol / Substance Use: Not Applicable Psych Involvement: No (comment)  Admission diagnosis:  Intractable nausea and vomiting [R11.2] Patient Active Problem List   Diagnosis Date Noted   Intractable nausea and vomiting 12/23/2023   Iron deficiency anemia due to chronic blood loss 12/17/2023   Acute on chronic diastolic CHF (congestive heart failure) (HCC) 12/14/2023   Peptic ulcer disease 12/14/2023   Bilateral lower extremity edema 12/12/2023   Hypophosphatemia 12/12/2023   Abdominal pain 12/07/2023   GIB (gastrointestinal bleeding) 12/04/2023   Acute gastric ulcer with hemorrhage 11/28/2023   Acute post-hemorrhagic anemia 11/27/2023   Prolonged QT interval 11/26/2023   Esophageal hiatal hernia 11/26/2023   Essential hypertension 11/26/2023   Chronic anemia 11/02/2023   Trigeminal neuralgia 05/13/2023   Anxiety 05/30/2020   AVM (arteriovenous malformation) brain 05/30/2020   External otitis of left ear 05/30/2020   Hypokalemia 05/30/2020   Hyperlipidemia 05/30/2020   History of intussusception 05/30/2020   Migraines 05/30/2020   Restless leg syndrome 05/30/2020   CKD (chronic kidney disease), 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/2020   Intussusception (HCC) 05/30/2020   Medicare annual wellness visit, subsequent 05/30/2020   MRSA (methicillin resistant staph aureus) culture positive 05/30/2020   Rash 05/30/2020   Prediabetes 05/30/2020   Nausea &  vomiting 05/30/2020   Chronic hip pain 05/30/2020   IBS (irritable bowel syndrome) 05/30/2020   Long-term current use of opiate analgesic 11/19/2018   ACTH elevation 12/12/2016   Trigeminal neuralgia pain 07/31/2016   Resistant hypertension 02/18/2016   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   Ganglion cyst of dorsum of right wrist 09/13/2015   Mild intermittent asthma without complication 04/25/2015    Idiopathic peripheral neuropathy 11/22/2014   Adrenal adenoma, right 07/10/2014   Prolactinoma (HCC) 07/07/2014   Osteoarthritis of cervical spine 06/06/2014   Cubital tunnel syndrome 08/24/2013   Spinal stenosis of lumbar region with neurogenic claudication 08/03/2013   GAD (generalized anxiety disorder) 09/12/2011   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 08/08/2011   Severe episode of recurrent major depressive disorder (HCC) 08/08/2011   GERD (gastroesophageal reflux disease) 11/01/2009   PCP:  Patient, No Pcp Per Pharmacy:   [id number] [place], [place] - [address] AT [facility] [address] [place] [address] Phone: [phone] Fax: [phone]  [facility] (Now [facility]) - [address] [place], [place] - [address] [place] [address] Phone: [phone] Fax: [phone]     Social Drivers of Health (SDOH) Social History: SDOH Screenings   Food Insecurity: Food Insecurity Present (12/12/2023)  Housing: Low Risk  (12/12/2023)  Transportation Needs: No Transportation Needs (12/12/2023)  Utilities: Not At Risk (12/12/2023)  Tobacco Use: High Risk (12/23/2023)   SDOH Interventions:     Readmission Risk Interventions    12/06/2023   11:26 AM 11/28/2023   10:56 AM 11/27/2023   10:16 AM  Readmission Risk Prevention Plan  Transportation Screening Complete Complete Complete  Medication Review Oceanographer) Complete Complete Complete  HRI or Home Care Consult Complete Complete Complete  SW Recovery Care/Counseling Consult Complete Complete Complete  Palliative Care Screening Not Applicable Not Applicable Not Applicable  Skilled Nursing Facility Not Applicable Not Applicable Not Applicable

## 2023-12-23 NOTE — Telephone Encounter (Signed)
 Pt currently being admitted to the hospital.

## 2023-12-23 NOTE — ED Provider Notes (Signed)
 [place] EMERGENCY DEPARTMENT AT [facility] Provider Note   CSN: [id number] Arrival date & time: 12/23/23  9748     Patient presents with: Emesis   Renee Gutierrez is a 63 y.o. female.   Patient presents to the emergency department for evaluation of vomiting and upper abdominal pain.  Patient with recurrent similar symptoms.  She denies hematemesis.       Prior to Admission medications   Medication Sig Start Date End Date Taking? Authorizing Provider  amLODipine  (NORVASC ) 10 MG tablet Take 1 tablet (10 mg total) by mouth daily. 12/15/23   Arrien, Elidia Sieving, MD  DULoxetine  (CYMBALTA ) 60 MG capsule Take 60 mg by mouth 2 (two) times daily. 05/16/20   [provider]  erythromycin  ophthalmic ointment Place 1 Application into the left eye 3 (three) times daily. 12/14/23   Arrien, Mauricio Daniel, MD  labetalol  (NORMODYNE ) 200 MG tablet Take 1 tablet (200 mg total) by mouth 2 (two) times daily. 12/14/23   Arrien, Elidia Sieving, MD  lisinopril  (ZESTRIL ) 10 MG tablet Take 1 tablet (10 mg total) by mouth daily. 12/14/23   Arrien, Mauricio Daniel, MD  morphine  (MS CONTIN ) 15 MG 12 hr tablet Take 15 mg by mouth every 12 (twelve) hours.    [provider]  morphine  (MSIR) 15 MG tablet Take 15 mg by mouth every 4 (four) hours as needed for severe pain (pain score 7-10).    [provider]  Na Sulfate-K Sulfate-Mg Sulfate concentrate (SUPREP) 17.5-3.13-1.6 GM/177ML SOLN 1 kit as directed 12/18/23   Castaneda Mayorga, Sieving, MD  ondansetron  (ZOFRAN -ODT) 8 MG disintegrating tablet Take 1 tablet (8 mg total) by mouth every 6 (six) hours as needed for nausea or vomiting. 11/29/23   Darci Pore, MD  pantoprazole  (PROTONIX ) 40 MG tablet Take 1 tablet (40 mg total) by mouth 2 (two) times daily. 11/29/23   Darci Pore, MD  phenytoin  (DILANTIN ) 100 MG ER capsule Take 100 mg by mouth. Patient taking differently: Take 100 mg by mouth 2  (two) times daily. 12/09/23   [provider]  prednisoLONE  acetate (PRED FORTE ) 1 % ophthalmic suspension Place 1 drop into the left eye in the morning and at bedtime. 12/14/23   Arrien, Mauricio Daniel, MD  pregabalin  (LYRICA ) 50 MG capsule Take 50 mg by mouth 3 (three) times daily. 12/08/23   [provider]  rOPINIRole  (REQUIP ) 2 MG tablet Take 4 mg by mouth See admin instructions. Take 1 tablet in the morning and 2 tablet every evening. 11/11/14   [provider]  simethicone (GAS RELIEF EXTRA STRENGTH) 125 MG chewable tablet Chew 125 mg by mouth daily as needed for flatulence.  05/24/14   [provider]  spironolactone (ALDACTONE) 25 MG tablet Take 1 tablet (25 mg total) by mouth daily. 12/14/23   Arrien, Mauricio Daniel, MD  sucralfate (CARAFATE) 1 GM/10ML suspension Take 10 mLs (1 g total) by mouth 4 (four) times daily -  with meals and at bedtime. 12/07/23   Evonnie Lenis, MD    Allergies: Buprenorphine hcl, Bupropion, Codeine, Sulfamethoxazole-trimethoprim, Nsaids, Pregabalin , Sulfa antibiotics, Tapentadol, Clindamycin, Clindamycin/lincomycin, Cymbalta  [duloxetine  hcl], Diclofenac, Diclofenac sodium, Gabapentin, Oxycodone , and Toradol  [ketorolac  tromethamine ]    Review of Systems  Updated Vital Signs BP (!) 144/118   Pulse 93   Temp 98.5 F (36.9 C) (Oral)   Resp 18   Ht 4' (1.219 m)   SpO2 99%   BMI 29.94 kg/m   Physical Exam Vitals and nursing note reviewed.  Constitutional:      General: She is not in acute distress.    Appearance: She is well-developed.  HENT:     Head: Normocephalic and atraumatic.     Mouth/Throat:     Mouth: Mucous membranes are moist.  Eyes:     General: Vision grossly intact. Gaze aligned appropriately.     Extraocular Movements: Extraocular movements intact.     Conjunctiva/sclera: Conjunctivae normal.  Cardiovascular:     Rate and Rhythm: Normal rate and regular rhythm.     Pulses: Normal pulses.     Heart  sounds: Normal heart sounds, S1 normal and S2 normal. No murmur heard.    No friction rub. No gallop.  Pulmonary:     Effort: Pulmonary effort is normal. No respiratory distress.     Breath sounds: Normal breath sounds.  Abdominal:     General: Bowel sounds are normal.     Palpations: Abdomen is soft.     Tenderness: There is generalized abdominal tenderness. There is no guarding or rebound.     Hernia: No hernia is present.  Musculoskeletal:        General: No swelling.     Cervical back: Full passive range of motion without pain, normal range of motion and neck supple. No spinous process tenderness or muscular tenderness. Normal range of motion.     Right lower leg: No edema.     Left lower leg: No edema.  Skin:    General: Skin is warm and dry.     Capillary Refill: Capillary refill takes less than 2 seconds.     Findings: No ecchymosis, erythema, rash or wound.  Neurological:     General: No focal deficit present.     Mental Status: She is alert and oriented to person, place, and time.     GCS: GCS eye subscore is 4. GCS verbal subscore is 5. GCS motor subscore is 6.     Cranial Nerves: Cranial nerves 2-12 are intact.     Sensory: Sensation is intact.     Motor: Motor function is intact.     Coordination: Coordination is intact.  Psychiatric:        Attention and Perception: Attention normal.        Mood and Affect: Mood normal.        Speech: Speech normal.        Behavior: Behavior normal.     (all labs ordered are listed, but only abnormal results are displayed) Labs Reviewed  CBC WITH DIFFERENTIAL/PLATELET - Abnormal; Notable for the following components:      Result Value   WBC 16.0 (*)    Hemoglobin 11.8 (*)    Platelets 480 (*)    Neutro Abs 13.5 (*)    Abs Immature Granulocytes 0.08 (*)    All other components within normal limits  COMPREHENSIVE METABOLIC PANEL WITH GFR - Abnormal; Notable for the following components:   Potassium 2.9 (*)    Glucose, Bld 109  (*)    All other components within normal limits  LIPASE, BLOOD  MAGNESIUM     EKG: EKG Interpretation Date/Time:  Tuesday December 23 2023 03:35:57 EST Ventricular Rate:  75 PR Interval:  118 QRS Duration:  89 QT Interval:  413 QTC Calculation: 462 R Axis:   22  Text Interpretation: Sinus rhythm Borderline short PR interval Confirmed by Haze Lonni PARAS ([id number]) on 12/23/2023 3:37:23 AM  Radiology: [facility] Chest 2 View Result Date: 12/21/2023 EXAM: 2 VIEW(S) XRAY OF THE  CHEST 12/21/2023 02:47:01 PM COMPARISON: Comparison 9 days ago. CLINICAL HISTORY: chest pain chest pain FINDINGS: LUNGS AND PLEURA: Increased left basilar opacity is noted concerning for worsening pneumonia or atelectasis with associated effusion. Minimal right pleural effusion is noted. No pulmonary edema. No pneumothorax. HEART AND MEDIASTINUM: No acute abnormality of the cardiac and mediastinal silhouettes. BONES AND SOFT TISSUES: No acute osseous abnormality. IMPRESSION: 1. Worsening left basilar opacity with associated effusion, favoring pneumonia versus atelectasis. 2. Minimal right pleural effusion. Electronically signed by: Lynwood Seip MD 12/21/2023 02:49 PM EST RP Workstation: [id number]     Procedures   Medications Ordered in the ED  metoCLOPramide  (REGLAN ) injection 10 mg (10 mg Intravenous Given 12/23/23 0339)  diphenhydrAMINE  (BENADRYL ) injection 25 mg (25 mg Intravenous Given 12/23/23 0339)  LORazepam (ATIVAN) injection 0.5 mg (0.5 mg Intravenous Given 12/23/23 0338)  droperidol  (INAPSINE ) 2.5 MG/ML injection 2.5 mg (2.5 mg Intravenous Given 12/23/23 0358)  rOPINIRole  (REQUIP ) tablet 2 mg (2 mg Oral Given 12/23/23 0526)  HYDROmorphone  (DILAUDID ) injection 1 mg (1 mg Intravenous Given 12/23/23 0526)                                    Medical Decision Making Amount and/or Complexity of Data Reviewed Labs: ordered.  Risk Prescription drug management.   Differential Diagnosis considered includes, but  not limited to: Cholelithiasis; cholecystitis; cholangitis; bowel obstruction; esophagitis; gastritis; peptic ulcer disease; pancreatitis; cardiac.  Presents with recurrent nausea and vomiting, abdominal pain.  Patient has had prior workups for this.  She has a known hiatal hernia and also has had treatment for peptic ulcer in the past.  No evidence of bleeding currently.  Patient uncomfortable secondary to pain and vomiting.  EKG performed, no ischemic changes, no QTc prolongation.  Ordered multiple drugs to help with this patient with recurrent vomiting. She became very angry that she was not getting pain medication. I informed her that pain meds were not indicated as first line treatment for vomiting.  Vomiting seems to have improved, the patient still complaining of pain.  She was administered IV analgesia, as she has chronic pain syndrome and is chronically on narcotics and is at risk for withdrawal.  Patient with mild hypokalemia, persistent hypertension, not holding down meds, will admit to hospitalist service for further management.     Final diagnoses:  Nausea and vomiting, unspecified vomiting type    ED Discharge Orders     None          Haze Lonni PARAS, MD 12/23/23 [phone]

## 2023-12-23 NOTE — Telephone Encounter (Signed)
 Noted. Thanks.

## 2023-12-23 NOTE — H&P (Signed)
 History and Physical    KOA PALLA FMW:[id number] DOB: [birth year] DOA: 12/23/2023  PCP: Patient, No Pcp Per   Patient coming from: Home  I have personally briefly reviewed patient's old medical records in [facility]  Chief Complaint: Nausea vomiting abdominal pain  HPI: Renee Gutierrez is a 63 y.o. female with medical history significant of GERD, IBS, left facial pain, trigeminal neuralgia, cerebral AVM involving brainstem, hiatal hernia status post Nissen fundoplication, recurrent nausea vomiting who presents to the ER with complaints of nausea vomiting and upper abdominal pain.  She was hospitalized for similar issue in October twice when she had EGD done and found to have large ulcer distal to her esophagus.  She also had bleeding from the ulcer with hematemesis.  She has been referred to [facility] for surgical evaluation which is under process.  Most recently she was hospitalized for few days at the end of October for diastolic heart failure.  Patient comes with recurrent nausea vomiting and upper abdominal pain.  Denies any hematemesis or melena.  Pain is localized across the upper abdomen.  She denies any urinary symptoms.  She denies any diarrhea, hematochezia or melena.  She states she takes Protonix , Carafate and Compazine  but despite them she continues to have nausea and vomiting which actually got worse in the past 2 days.   ED Course: She was hypertensive in the ER, borderline tachycardic, afebrile.  She did receive IV Compazine , Dilaudid  and Ativan with some improvement.  Laboratory work notable for potassium of 2.9, creatinine at baseline and normal.  White count 16, hemoglobin 11.8.  Patient admitted for ongoing symptoms.  Hospitalist service was called to evaluate the patient.  Review of Systems: As per HPI otherwise all other systems were reviewed and are negative.   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    Past Surgical History:  Procedure Laterality Date   ABDOMINAL HYSTERECTOMY     ABDOMINAL SURGERY     tumor removed   APPENDECTOMY     BRAIN SURGERY     to fix AVM   CEREBRAL ANEURYSM REPAIR     CERVICAL FUSION     CESAREAN SECTION     CHOLECYSTECTOMY     ESOPHAGEAL DILATION N/A 11/28/2023   Procedure: DILATION, ESOPHAGUS;  Surgeon: Cindie Carlin POUR, DO;  [facility]: [facility];  Service: Endoscopy;  Laterality: N/A;   ESOPHAGOGASTRODUODENOSCOPY N/A 11/28/2023   Procedure: EGD (ESOPHAGOGASTRODUODENOSCOPY);  Surgeon: Cindie Carlin POUR, DO;  Location: [facility];  Service: Endoscopy;  Laterality: N/A;   ESOPHAGOGASTRODUODENOSCOPY N/A 12/05/2023   Procedure: EGD (ESOPHAGOGASTRODUODENOSCOPY);  Surgeon: Eartha Flavors, Toribio, MD;  Location: [facility];  Service: Gastroenterology;  Laterality: N/A;   LUNG SURGERY     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     reports that she has been smoking cigarettes. She has never used smokeless tobacco. She reports that she  does not drink alcohol and does not use drugs.  Allergies  Allergen Reactions   Buprenorphine Hcl Swelling    All extremities and neck and face swelled up huge according to patient   Bupropion Swelling and Other (See Comments)    Moody and depressed   Codeine Itching, Nausea And Vomiting, Nausea Only and Swelling   Sulfamethoxazole-Trimethoprim Swelling and Other (See Comments)    Swelled up her eye   Nsaids Other (See Comments)    Unknown    Pregabalin  Swelling    Lyrica     Sulfa Antibiotics Other  (See Comments)    Unknown    Tapentadol Nausea And Vomiting    Pt states she can take   Clindamycin Other (See Comments)    Heart burn   Clindamycin/Lincomycin Rash and Other (See Comments)    Heart burn   Cymbalta  [Duloxetine  Hcl] Swelling   Diclofenac Swelling    Cream    Diclofenac Sodium Swelling    Oral med   Gabapentin Swelling   Oxycodone  Nausea Only    No longer has a reaction per pt   Toradol  [Ketorolac  Tromethamine ] Rash    Family History  Problem Relation Age of Onset   Throat cancer Mother    Bladder Cancer Father    Liver cancer Father    Colon cancer Neg Hx     Prior to Admission medications   Medication Sig Start Date End Date Taking? Authorizing Provider  amLODipine  (NORVASC ) 10 MG tablet Take 1 tablet (10 mg total) by mouth daily. 12/15/23  Yes Arrien, Elidia Sieving, MD  DULoxetine  (CYMBALTA ) 60 MG capsule Take 60 mg by mouth 2 (two) times daily. 05/16/20  Yes [provider]  erythromycin  ophthalmic ointment Place 1 Application into the left eye 3 (three) times daily. 12/14/23  Yes Arrien, Elidia Sieving, MD  labetalol  (NORMODYNE ) 200 MG tablet Take 1 tablet (200 mg total) by mouth 2 (two) times daily. 12/14/23  Yes Arrien, Elidia Sieving, MD  lisinopril  (ZESTRIL ) 10 MG tablet Take 1 tablet (10 mg total) by mouth daily. 12/14/23  Yes Arrien, Elidia Sieving, MD  morphine  (MSIR) 15 MG tablet Take 15 mg by mouth every 4 (four) hours as needed for severe pain (pain score 7-10).   Yes [provider]  ondansetron  (ZOFRAN -ODT) 8 MG disintegrating tablet Take 1 tablet (8 mg total) by mouth every 6 (six) hours as needed for nausea or vomiting. 11/29/23  Yes Darci Pore, MD  pantoprazole  (PROTONIX ) 40 MG tablet Take 1 tablet (40 mg total) by mouth 2 (two) times daily. 11/29/23  Yes Darci Pore, MD  phenytoin  (DILANTIN ) 100 MG ER capsule Take 100 mg by mouth. Patient taking differently: Take 100 mg by mouth 2 (two) times daily.  12/09/23  Yes [provider]  prednisoLONE  acetate (PRED FORTE ) 1 % ophthalmic suspension Place 1 drop into the left eye in the morning and at bedtime. 12/14/23  Yes Arrien, Elidia Sieving, MD  pregabalin  (LYRICA ) 50 MG capsule Take 50 mg by mouth 3 (three) times daily. 12/08/23  Yes [provider]  rOPINIRole  (REQUIP ) 2 MG tablet Take 4 mg by mouth See admin instructions. Take 1 tablet in the morning and 2 tablet every evening. 11/11/14  Yes [provider]  simethicone (GAS RELIEF EXTRA STRENGTH) 125 MG chewable tablet Chew 125 mg by mouth daily as needed for flatulence.  05/24/14  Yes [provider]  spironolactone (ALDACTONE) 25 MG tablet Take 1 tablet (25 mg total) by mouth daily. 12/14/23  Yes Arrien, Mauricio Daniel, MD  sucralfate (CARAFATE) 1 GM/10ML suspension Take 10 mLs (1 g total) by mouth 4 (four) times daily -  with meals and at bedtime. 12/07/23  Yes Tat, Alm, MD  morphine  (MS CONTIN ) 15 MG 12 hr tablet Take 15 mg by mouth every 12 (twelve) hours.    [provider]  Na Sulfate-K Sulfate-Mg Sulfate concentrate (SUPREP) 17.5-3.13-1.6 GM/177ML SOLN 1 kit as directed 12/18/23   Eartha Flavors, Toribio, MD    Physical Exam: Vitals:   12/23/23 0706 12/23/23 0711 12/23/23 1056 12/23/23 1120  BP:    (!) 178/76  Pulse:      Resp:      Temp:  98.6 F (37 C) 98.7 F (37.1 C)   TempSrc:  Oral Oral   SpO2: 99%     Height:        Constitutional: NAD, calm, comfortable Vitals:   12/23/23 0706 12/23/23 0711 12/23/23 1056 12/23/23 1120  BP:    (!) 178/76  Pulse:      Resp:      Temp:  98.6 F (37 C) 98.7 F (37.1 C)   TempSrc:  Oral Oral   SpO2: 99%     Height:       General: Alert, oriented not in any acute distress HEENT: Dry oral mucosa, Chest: Clear to auscultation bilaterally, no wheezing or crackles Abdomen: Soft, mild epigastric tenderness, no rebound tenderness Extremities: No edema  Labs on Admission: I have  personally reviewed following labs and imaging studies  CBC: Recent Labs  Lab 12/21/23 1452 12/23/23 0359  WBC 11.0* 16.0*  NEUTROABS  --  13.5*  HGB 10.9* 11.8*  HCT 35.5* 36.7  MCV 88.1 84.6  PLT 421* 480*   Basic Metabolic Panel: Recent Labs  Lab 12/21/23 1452 12/23/23 0457  NA 142 143  K 3.5 2.9*  CL 105 103  CO2 27 28  GLUCOSE 79 109*  BUN 12 9  CREATININE 0.63 0.85  CALCIUM  8.9 9.9  MG  --  2.1   GFR: Estimated Creatinine Clearance: 30.1 mL/min (by C-G formula based on SCr of 0.85 mg/dL). Liver Function Tests: Recent Labs  Lab 12/23/23 0457  AST 15  ALT 9  ALKPHOS 84  BILITOT <0.2  PROT 7.3  ALBUMIN 3.7   Recent Labs  Lab 12/23/23 0457  LIPASE 15   No results for input(s): AMMONIA in the last 168 hours. Coagulation Profile: Recent Labs  Lab 12/21/23 1452  INR 0.9   Cardiac Enzymes: No results for input(s): CKTOTAL, CKMB, CKMBINDEX, TROPONINI in the last 168 hours. BNP (last 3 results) Recent Labs    12/12/23 0435  PROBNP 355.0*   HbA1C: No results for input(s): HGBA1C in the last 72 hours. CBG: No results for input(s): GLUCAP in the last 168 hours. Lipid Profile: No results for input(s): CHOL, HDL, LDLCALC, TRIG, CHOLHDL, LDLDIRECT in the last 72 hours. Thyroid Function Tests: No results for input(s): TSH, T4TOTAL, FREET4, T3FREE, THYROIDAB in the last 72 hours. Anemia Panel: No results for input(s): VITAMINB12, FOLATE, FERRITIN, TIBC, IRON, RETICCTPCT in the last 72 hours. Urine analysis:    Component Value Date/Time   COLORURINE STRAW (A) 11/25/2023 2053   APPEARANCEUR CLEAR 11/25/2023 2053   LABSPEC 1.011 11/25/2023 2053   PHURINE 7.0 11/25/2023 2053   GLUCOSEU NEGATIVE 11/25/2023 2053   HGBUR NEGATIVE 11/25/2023 2053   BILIRUBINUR NEGATIVE 11/25/2023 2053   KETONESUR 20 (A) 11/25/2023 2053   PROTEINUR NEGATIVE 11/25/2023 2053   UROBILINOGEN 0.2  12/03/2014 1500   NITRITE  NEGATIVE 11/25/2023 2053   LEUKOCYTESUR NEGATIVE 11/25/2023 2053    Radiological Exams on Admission: DG Chest 2 View Result Date: 12/21/2023 EXAM: 2 VIEW(S) XRAY OF THE CHEST 12/21/2023 02:47:01 PM COMPARISON: Comparison 9 days ago. CLINICAL HISTORY: chest pain chest pain FINDINGS: LUNGS AND PLEURA: Increased left basilar opacity is noted concerning for worsening pneumonia or atelectasis with associated effusion. Minimal right pleural effusion is noted. No pulmonary edema. No pneumothorax. HEART AND MEDIASTINUM: No acute abnormality of the cardiac and mediastinal silhouettes. BONES AND SOFT TISSUES: No acute osseous abnormality. IMPRESSION: 1. Worsening left basilar opacity with associated effusion, favoring pneumonia versus atelectasis. 2. Minimal right pleural effusion. Electronically signed by: Lynwood Seip MD 12/21/2023 02:49 PM EST RP Workstation: [id number]    EKG: Independently reviewed.  Normal sinus rhythm, QTc 462  Assessment/Plan  Intractable nausea vomiting Upper abdominal pain:  Recurrent history with known GERD, history of hiatal hernia status post Nissen fundoplication in the past GI bleeding in the past with none gastric ulcer. - Will obtain CT abdomen and pelvis with contrast to rule out alternate etiologies. - Continue symptomatic management, IV Compazine , IV Dilaudid  as needed - Continue IV Protonix  - Will give IV normal saline 1 L bolus followed by maintenance for hydration - IV potassium supplements - Clear liquid diet if tolerates - Hemoglobin is stable, watch for any GI bleeding - Patient is well-known to GI and has been referred to [facility], per patient appointment has not been set yet.  Will need to follow-up after discharge. - Will consult GI if symptoms not improving  Hypertension: Resume home medications  Resume home medications as appropriate    DVT prophylaxis: SCDs, no pharmacological prophylaxis due to risk of bleeding Code Status: Full code Family  Communication: No family at the bedside Disposition Plan: Home Consults called: None Admission status: Observation  Severity of Illness: The appropriate patient status for this patient is OBSERVATION. Observation status is judged to be reasonable and necessary in order to provide the required intensity of service to ensure the patient's safety. The patient's presenting symptoms, physical exam findings, and initial radiographic and laboratory data in the context of their medical condition is felt to place them at decreased risk for further clinical deterioration. Furthermore, it is anticipated that the patient will be medically stable for discharge from the hospital within 2 midnights of admission.     Derryl Duval MD [facility]  12/23/2023, 12:21 PM

## 2023-12-23 NOTE — Discharge Instructions (Signed)

## 2023-12-23 NOTE — Plan of Care (Signed)

## 2023-12-23 NOTE — ED Triage Notes (Signed)
 Pt c/o of emesis and upper abd pain from a hernia x2 days.

## 2023-12-23 NOTE — Progress Notes (Signed)
 MEWS Progress Note  Patient Details Name: Renee Gutierrez MRN: [id number] DOB: 07/11/1960 Today's Date: 12/23/2023   MEWS Flowsheet Documentation:  Assess: MEWS Score Temp: 98.6 F (37 C) BP: (!) 163/78 MAP (mmHg): 102 Pulse Rate: (!) 112 ECG Heart Rate: 92 Resp: 18 Level of Consciousness: Alert SpO2: 100 % O2 Device: Room Air Assess: MEWS Score MEWS Temp: 0 MEWS Systolic: 0 MEWS Pulse: 2 MEWS RR: 0 MEWS LOC: 0 MEWS Score: 2 MEWS Score Color: Yellow Assess: SIRS CRITERIA SIRS Temperature : 0 SIRS Respirations : 0 SIRS Pulse: 1 SIRS WBC: 1 SIRS Score Sum : 2 Assess: if the MEWS score is Yellow or Red Were vital signs accurate and taken at a resting state?: Yes Does the patient meet 2 or more of the SIRS criteria?: No MEWS guidelines implemented : Yes, yellow Treat MEWS Interventions: Considered administering scheduled or prn medications/treatments as ordered Take Vital Signs Increase Vital Sign Frequency : Yellow: Q2hr x1, continue Q4hrs until patient remains green for 12hrs Escalate MEWS: Escalate: Yellow: Discuss with charge nurse and consider notifying provider and/or RRT Provider Notification Provider Name/Title: Dr. Mcarthur Date Provider Notified: 12/23/23 Time Provider Notified: 1500 Method of Notification:  (secure chat) Notification Reason: Other (Comment) (yellow MEWS) Provider response: See new orders Date of Provider Response: 12/23/23 Time of Provider Response: [id number]      [name] 12/23/2023, 3:05 PM

## 2023-12-23 NOTE — Telephone Encounter (Signed)
 Patient is at [facility] ED this morning.

## 2023-12-23 NOTE — Care Management Obs Status (Signed)
 MEDICARE OBSERVATION STATUS NOTIFICATION   Patient Details  Name: Renee Gutierrez MRN: [id number] Date of Birth: November 23, 1960   Medicare Observation Status Notification Given:  Yes    Mcarthur Saddie Kim, LCSW 12/23/2023, 3:06 PM

## 2023-12-23 NOTE — ED Notes (Signed)
 Patient continues to roll around in bed and remove monitoring equipment despite being educated on the importance of leaving it on.

## 2023-12-23 NOTE — Telephone Encounter (Signed)
-----   Message from Toribio Fortune Mayorga sent at 12/22/2023  9:17 PM EST ----- Regarding: RE:  Yes, please.  She has a history of poor bowel prep in the past and I doubt suprep will work for her. Thanks ----- Message ----- From: Dallie Lionel RAMAN, CMA Sent: 12/22/2023   8:02 AM EST To: Toribio Fortune Flavors, MD  I had sent in Suprep due to her allergy. Do you want me to send in the [facility] and send new instructions? ----- Message ----- From: Fortune Flavors Toribio, MD Sent: 12/20/2023  12:04 AM EST To: Lionel RAMAN Dallie, CMA  Please send her Golytely or Nulytely Thanks

## 2023-12-24 DIAGNOSIS — R112 Nausea with vomiting, unspecified: Secondary | ICD-10-CM | POA: Diagnosis not present

## 2023-12-24 DIAGNOSIS — R1010 Upper abdominal pain, unspecified: Secondary | ICD-10-CM | POA: Diagnosis not present

## 2023-12-24 LAB — CBC
HCT: 35.2 % — ABNORMAL LOW (ref 36.0–46.0)
Hemoglobin: 11.2 g/dL — ABNORMAL LOW (ref 12.0–15.0)
MCH: 27.4 pg (ref 26.0–34.0)
MCHC: 31.8 g/dL (ref 30.0–36.0)
MCV: 86.1 fL (ref 80.0–100.0)
Platelets: 402 K/uL — ABNORMAL HIGH (ref 150–400)
RBC: 4.09 MIL/uL (ref 3.87–5.11)
RDW: 15.1 % (ref 11.5–15.5)
WBC: 8.1 K/uL (ref 4.0–10.5)
nRBC: 0 % (ref 0.0–0.2)

## 2023-12-24 LAB — COMPREHENSIVE METABOLIC PANEL WITH GFR
ALT: 9 U/L (ref 0–44)
AST: 16 U/L (ref 15–41)
Albumin: 3.3 g/dL — ABNORMAL LOW (ref 3.5–5.0)
Alkaline Phosphatase: 75 U/L (ref 38–126)
Anion gap: 13 (ref 5–15)
BUN: 7 mg/dL — ABNORMAL LOW (ref 8–23)
CO2: 23 mmol/L (ref 22–32)
Calcium: 9.2 mg/dL (ref 8.9–10.3)
Chloride: 105 mmol/L (ref 98–111)
Creatinine, Ser: 0.58 mg/dL (ref 0.44–1.00)
GFR, Estimated: >60 mL/min (ref >60)
Glucose, Bld: 93 mg/dL (ref 70–99)
Potassium: 3.6 mmol/L (ref 3.5–5.1)
Sodium: 141 mmol/L (ref 135–145)
Total Bilirubin: 0.2 mg/dL (ref 0.0–1.2)
Total Protein: 6.3 g/dL — ABNORMAL LOW (ref 6.5–8.1)

## 2023-12-24 MED ORDER — ERYTHROMYCIN 5 MG/GM OP OINT
1.0000 | TOPICAL_OINTMENT | Freq: Three times a day (TID) | OPHTHALMIC | Status: DC
  Administered 2023-12-24: 1 via OPHTHALMIC
  Filled 2023-12-24: qty 1

## 2023-12-24 MED ORDER — BOOST / RESOURCE BREEZE PO LIQD CUSTOM
1.0000 | Freq: Three times a day (TID) | ORAL | Status: DC
  Administered 2023-12-24: 1 via ORAL

## 2023-12-24 MED ORDER — PREDNISOLONE ACETATE 1 % OP SUSP
1.0000 [drp] | Freq: Two times a day (BID) | OPHTHALMIC | Status: DC
  Administered 2023-12-24: 1 [drp] via OPHTHALMIC
  Filled 2023-12-24: qty 1

## 2023-12-24 MED ORDER — PROCHLORPERAZINE MALEATE 10 MG PO TABS: 10.0000 mg | ORAL_TABLET | Freq: Four times a day (QID) | ORAL | 0 refills | Status: DC | PRN

## 2023-12-24 NOTE — Discharge Summary (Signed)
 Physician Discharge Summary   Patient: Renee Gutierrez MRN: [id number] DOB: 05-31-1960  Admit date:     12/23/2023  Discharge date: 12/24/23  Discharge Physician: Bernardino KATHEE Come   PCP: Patient, No Pcp Per   Recommendations at discharge:  {Tip this will not be part of the note when signed- Example include specific recommendations for outpatient follow-up, pending tests to follow-up on. (Optional):26781}  ***  Discharge Diagnoses: Principal Problem:   Intractable nausea and vomiting   Hospital Course: Renee Gutierrez is a 63 y.o. female with medical history significant of GERD, IBS, left facial pain, trigeminal neuralgia, cerebral AVM involving brainstem, hiatal hernia status post Nissen fundoplication, recurrent nausea vomiting who presents to the ER with complaints of nausea vomiting and upper abdominal pain.  She was hospitalized for similar issue in October twice when she had EGD done and found to have large ulcer distal to her esophagus.  She also had bleeding from the ulcer with hematemesis.  She has been referred to [facility] for surgical evaluation which is under process.  Most recently she was hospitalized for few days at the end of October for diastolic heart failure.  Patient comes with recurrent nausea vomiting and upper abdominal pain.  Denies any hematemesis or melena.  Pain is localized across the upper abdomen.  She denies any urinary symptoms.  She denies any diarrhea, hematochezia or melena.  She states she takes Protonix , Carafate and Compazine  but despite them she continues to have nausea and vomiting which actually got worse in the past 2 days.     ED Course: She was hypertensive in the ER, borderline tachycardic, afebrile.  She did receive IV Compazine , Dilaudid  and Ativan with some improvement.  Laboratory work notable for potassium of 2.9, creatinine at baseline and normal.  White count 16, hemoglobin 11.8.  Patient admitted for ongoing symptoms.   Hospitalist service was  called to evaluate the patient.    Assessment and Plan: Upper abdominal pain:   Recurrent history with known GERD, history of hiatal hernia status post Nissen fundoplication in the past GI bleeding in the past with none gastric ulcer. - Will obtain CT abdomen and pelvis with contrast to rule out alternate etiologies. - Continue symptomatic management, IV Compazine , IV Dilaudid  as needed - Continue IV Protonix  - Will give IV normal saline 1 L bolus followed by maintenance for hydration - IV potassium supplements - Clear liquid diet if tolerates - Hemoglobin is stable, watch for any GI bleeding - Patient is well-known to GI and has been referred to [facility], per patient appointment has not been set yet.  Will need to follow-up after discharge. - Will consult GI if symptoms not improving  Consultants: *** Procedures performed: ***  Disposition: {Plan; Disposition:26390} Diet recommendation:  {Diet_Plan:26776} DISCHARGE MEDICATION: Allergies as of 12/24/2023       Reactions   Buprenorphine Hcl Swelling   All extremities and neck and face swelled up huge according to patient   Bupropion Swelling, Other (See Comments)   Moody and depressed   Codeine Itching, Nausea And Vomiting, Nausea Only, Swelling   Sulfamethoxazole-trimethoprim Swelling, Other (See Comments)   Swelled up her eye   Nsaids Other (See Comments)   Unknown    Pregabalin  Swelling   Lyrica     Sulfa Antibiotics Other (See Comments)   Unknown    Tapentadol Nausea And Vomiting   Pt states she can take   Clindamycin Other (See Comments)   Heart burn   Clindamycin/lincomycin Rash, Other (See  Comments)   Heart burn   Cymbalta  [duloxetine  Hcl] Swelling   Diclofenac Swelling   Cream    Diclofenac Sodium Swelling   Oral med   Gabapentin Swelling   Oxycodone  Nausea Only   No longer has a reaction per pt   Toradol  [ketorolac  Tromethamine ] Rash     Med Rec must be completed prior to using this  [facility]***       Discharge Exam: Filed Weights   12/23/23 1417  Weight: 44.5 kg   ***  Condition at discharge: {DC Condition:26389}  The results of significant diagnostics from this hospitalization (including imaging, microbiology, ancillary and laboratory) are listed below for reference.   Imaging Studies: CT ABDOMEN PELVIS W CONTRAST Result Date: 12/23/2023 EXAM: CT ABDOMEN AND PELVIS WITH CONTRAST 12/23/2023 02:41:11 PM TECHNIQUE: CT of the abdomen and pelvis was performed with the administration of 100 mL of iohexol  (OMNIPAQUE ) 300 MG/ML solution. Multiplanar reformatted images are provided for review. Automated exposure control, iterative reconstruction, and/or weight-based adjustment of the mA/kV was utilized to reduce the radiation dose to as low as reasonably achievable. COMPARISON: CT Abd-pelv W/ 11/25/2023 and 12/04/2023. CLINICAL HISTORY: Upper abdominal pain with intractable nausea and vomiting. FINDINGS: LOWER CHEST: Small left pleural effusion. Left lower lobe subsegmental atelectasis. Small pericardial effusion is similar to 12/04/2023. There is adjacent free fluid in the posterior mediastinum. LIVER: The liver is unremarkable. GALLBLADDER AND BILE DUCTS: Cholecystectomy. No biliary ductal dilatation. SPLEEN: No acute abnormality. PANCREAS: No acute abnormality. ADRENAL GLANDS: Stable adrenal glands. KIDNEYS, URETERS AND BLADDER: No stones in the kidneys or ureters. No hydronephrosis. No perinephric or periureteral stranding. Urinary bladder is unremarkable. GI AND BOWEL: Postoperative change of Nissen fundoplication. Moderate hiatal hernia containing the fundoplication. Wall thickening about the distal esophagus is partially visualized and may in part be postoperative however esophagitis is not excluded. Stomach demonstrates no acute abnormality. There is no bowel obstruction. Normal appendix. PERITONEUM AND RETROPERITONEUM: No ascites. No free air. VASCULATURE: Aorta is normal  in caliber. Aortic atherosclerotic calcification. LYMPH NODES: No lymphadenopathy. REPRODUCTIVE ORGANS: No acute abnormality. BONES AND SOFT TISSUES: No acute osseous abnormality. No focal soft tissue abnormality. IMPRESSION: 1. Postoperative change of Nissen fundoplication with moderate hiatal hernia containing the fundoplication; distal esophageal wall thickening may be postoperative, however esophagitis is not excluded. Adjacent free fluid in the posterior mediastinum. Consider CT of the chest with IV contrast and 100 ml oral contrast administered a few minutes prior to the exam to better evaluate the hernia and fundoplication. 2. Small left pleural effusion with left lower lobe subsegmental atelectasis, decreased from 10 / 16 / 25. 3. Small pericardial effusion, similar to 12/04/23. Electronically signed by: Norman Gatlin MD 12/23/2023 07:35 PM EST RP Workstation: [id number]   DG Chest 2 View Result Date: 12/21/2023 EXAM: 2 VIEW(S) XRAY OF THE CHEST 12/21/2023 02:47:01 PM COMPARISON: Comparison 9 days ago. CLINICAL HISTORY: chest pain chest pain FINDINGS: LUNGS AND PLEURA: Increased left basilar opacity is noted concerning for worsening pneumonia or atelectasis with associated effusion. Minimal right pleural effusion is noted. No pulmonary edema. No pneumothorax. HEART AND MEDIASTINUM: No acute abnormality of the cardiac and mediastinal silhouettes. BONES AND SOFT TISSUES: No acute osseous abnormality. IMPRESSION: 1. Worsening left basilar opacity with associated effusion, favoring pneumonia versus atelectasis. 2. Minimal right pleural effusion. Electronically signed by: Lynwood Seip MD 12/21/2023 02:49 PM EST RP Workstation: [id number]   ECHOCARDIOGRAM COMPLETE Result Date: 12/12/2023    ECHOCARDIOGRAM REPORT   Patient Name:   Renee Gutierrez Date  of Exam: 12/12/2023 Medical Rec #:  [id number]         Height:       59.0 in Accession #:    [id number]        Weight:       103.0 lb Date of Birth:  October 26, 1960           BSA:          1.391 m Patient Age:    63 years          BP:           139/83 mmHg Patient Gender: F                 HR:           107 bpm. Exam Location:  [facility] Procedure: 2D Echo, Strain Analysis, Cardiac Doppler and Color Doppler (Both            Spectral and Color Flow Doppler were utilized during procedure). Indications:    CHF- Acute Diastolic I50.31  History:        Patient has no prior history of Echocardiogram examinations.                 Signs/Symptoms:Chest Pain; Risk Factors:Hypertension,                 Pre-diabetes, Dyslipidemia and Current Smoker.  Sonographer:    Koleen Popper RDCS Referring Phys: [phone] CARLOS MADERA  Sonographer Comments: Image acquisition challenging due to respiratory motion. Global longitudinal strain was attempted. IMPRESSIONS  1. Left ventricular ejection fraction, by estimation, is >75%. The left ventricle has hyperdynamic function. The left ventricle has no regional wall motion abnormalities. Left ventricular diastolic parameters are consistent with Grade I diastolic dysfunction (impaired relaxation). The average left ventricular global longitudinal strain is -18.9 %. The global longitudinal strain is normal.  2. Right ventricular systolic function is normal. The right ventricular size is normal. Tricuspid regurgitation signal is inadequate for assessing PA pressure.  3. Moderate pericardial effusion is anterior to the right ventricle. Small pericardial effusion is lateral to LV and localized along RA. There is no evidence of cardiac tamponade.  4. The mitral valve is normal in structure. No evidence of mitral valve regurgitation. No evidence of mitral stenosis.  5. The aortic valve is tricuspid. Aortic valve regurgitation is not visualized. No aortic stenosis is present.  6. The inferior vena cava is normal in size with greater than 50% respiratory variability, suggesting right atrial pressure of 3 mmHg. Comparison(s): No prior Echocardiogram. FINDINGS  Left  Ventricle: Left ventricular ejection fraction, by estimation, is >75%. The left ventricle has hyperdynamic function. The left ventricle has no regional wall motion abnormalities. The average left ventricular global longitudinal strain is -18.9 %. Strain was performed and the global longitudinal strain is normal. The left ventricular internal cavity size was normal in size. There is no left ventricular hypertrophy. Left ventricular diastolic parameters are consistent with Grade I diastolic dysfunction (impaired relaxation). Normal left ventricular filling pressure. Right Ventricle: The right ventricular size is normal. No increase in right ventricular wall thickness. Right ventricular systolic function is normal. Tricuspid regurgitation signal is inadequate for assessing PA pressure. Left Atrium: Left atrial size was normal in size. Right Atrium: Right atrial size was normal in size. Pericardium: A moderately sized pericardial effusion is present. The pericardial effusion is anterior to the right ventricle. There is no evidence of cardiac tamponade. Mitral Valve: The mitral valve is normal in structure.  No evidence of mitral valve regurgitation. No evidence of mitral valve stenosis. Tricuspid Valve: The tricuspid valve is normal in structure. Tricuspid valve regurgitation is not demonstrated. No evidence of tricuspid stenosis. Aortic Valve: The aortic valve is tricuspid. Aortic valve regurgitation is not visualized. No aortic stenosis is present. Pulmonic Valve: The pulmonic valve was grossly normal. Pulmonic valve regurgitation is not visualized. No evidence of pulmonic stenosis. Aorta: The aortic root and ascending aorta are structurally normal, with no evidence of dilitation. Venous: The inferior vena cava is normal in size with greater than 50% respiratory variability, suggesting right atrial pressure of 3 mmHg. IAS/Shunts: No atrial level shunt detected by color flow Doppler. Additional Comments: 3D was  performed not requiring image post processing on an independent workstation and was indeterminate.  LEFT VENTRICLE PLAX 2D LVIDd:         3.40 cm   Diastology LVIDs:         2.00 cm   LV e' medial:    5.44 cm/s LV PW:         1.10 cm   LV E/e' medial:  12.9 LV IVS:        1.10 cm   LV e' lateral:   9.25 cm/s LVOT diam:     1.80 cm   LV E/e' lateral: 7.6 LV SV:         47 LV SV Index:   34        2D Longitudinal Strain LVOT Area:     2.54 cm  2D Strain GLS Avg:     -18.9 %  RIGHT VENTRICLE             IVC RV Basal diam:  2.60 cm     IVC diam: 1.00 cm RV S prime:     20.00 cm/s TAPSE (M-mode): 2.2 cm LEFT ATRIUM             Index        RIGHT ATRIUM          Index LA diam:        2.70 cm 1.94 cm/m   RA Area:     8.90 cm LA Vol (A2C):   28.1 ml 20.20 ml/m  RA Volume:   14.30 ml 10.28 ml/m LA Vol (A4C):   32.5 ml 23.36 ml/m LA Biplane Vol: 31.7 ml 22.79 ml/m  AORTIC VALVE LVOT Vmax:   111.00 cm/s LVOT Vmean:  72.800 cm/s LVOT VTI:    0.184 m  AORTA Ao Root diam: 3.00 cm Ao Asc diam:  2.90 cm MITRAL VALVE MV Area (PHT): 5.46 cm     SHUNTS MV Decel Time: 139 msec     Systemic VTI:  0.18 m MV E velocity: 70.20 cm/s   Systemic Diam: 1.80 cm MV A velocity: 112.00 cm/s MV E/A ratio:  0.63 Vishnu Priya Mallipeddi Electronically signed by Diannah Late Mallipeddi Signature Date/Time: 12/12/2023/4:59:17 PM    Final    DG Chest Port 1 View Result Date: 12/12/2023 EXAM: 1 VIEW(S) XRAY OF THE CHEST 12/12/2023 04:42:00 AM COMPARISON: 12/01/2023 CLINICAL HISTORY: CP, HTN, leg swelling. CP, HTN, leg/foot swelling and pain, elevated BP @ time of imaging (203/93), painful stomach palpitations. FINDINGS: LUNGS AND PLEURA: Left base atelectasis. Mild right basilar atelectasis or scarring. No pulmonary edema. No pleural effusion. Skin fold artifact right chest. No pneumothorax. HEART AND MEDIASTINUM: Aortic calcification. Moderate chronic hiatal hernia. BONES AND SOFT TISSUES: Cervical spine surgical hardware noted. No acute  osseous abnormality. IMPRESSION:  1. No acute cardiopulmonary abnormality ;minor lung base atelectasis. 2. Moderate chronic hiatal hernia. Electronically signed by: Helayne Hurst MD 12/12/2023 05:58 AM EDT RP Workstation: [id number]   CT ANGIO GI BLEED Result Date: 12/04/2023 EXAM: CTA ABDOMEN AND PELVIS WITH CONTRAST 12/04/2023 09:59:01 AM TECHNIQUE: CTA images of the abdomen and pelvis with intravenous contrast. Three-dimensional MIP/volume rendered formations were performed. Automated exposure control, iterative reconstruction, and/or weight based adjustment of the mA/kV was utilized to reduce the radiation dose to as low as reasonably achievable. COMPARISON: CTA chest and CT abdomen and pelvis 11/25/2023. CLINICAL HISTORY: 63 year old female with abdominal pain, hematemesis, and suspected bleeding ulcers. FINDINGS: VASCULATURE: Stable aortoiliac atherosclerosis. Major arterial structures are patent and stable. Portal venous system appears patent on the delayed images. GI BLEED: No contrast extravasation identified into the stomach or duodenum, or elswhere. ABDOMEN/PELVIS: LOWER CHEST: Small but increased layering left pleural effusion with simple fluid density. Associated increased left lung base atelectasis or consolidation. No pericardial effusion. Right lung base more stable with curvilinear scarring or atelectasis. LIVER: The liver is unremarkable. GALLBLADDER AND BILE DUCTS: Chronic cholecystectomy. Chronic post cholecystectomy CBD enlargement is stable. SPLEEN: The spleen is unremarkable. PANCREAS: The pancreas is unremarkable. ADRENAL GLANDS: Bilateral adrenal glands demonstrate no acute abnormality. KIDNEYS, URETERS AND BLADDER: Symmetric renal enhancement and early contrast excretion. No stones in the kidneys or ureters. No hydronephrosis. No perinephric or periureteral stranding. Urinary bladder is unremarkable. GI AND BOWEL: Large gastrocaudal hernia redemonstrated. Evidence of chronic fundoplication  and herniated appearance of the fundoplication. No definite acute gastroduodenal inflammation. Non dilated large and small bowel loops. There is no bowel obstruction. No abnormal bowel wall thickening or distension. REPRODUCTIVE: Reproductive organs are unremarkable. PERITONEUM AND RETROPERITONEUM: No pneumoperitoneum. No free fluid identified. LYMPH NODES: No lymphadenopathy. BONES AND SOFT TISSUES: Chronic spine degeneration in the setting of advanced levoconvex scoliosis. No acute abnormality of the bones. No acute soft tissue abnormality. IMPRESSION: 1. 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 No active GI bleeding by CTA. No acute bowel inflammation identified. 2. Small but increased left pleural effusion and associated increased left lung base atelectasis or consolidation. Electronically signed by: Helayne Hurst MD 12/04/2023 10:11 AM EDT RP Workstation: [id number]   DG Abd Acute W/Chest Result Date: 12/01/2023 CLINICAL DATA:  Abdominal pain. EXAM: DG ABDOMEN ACUTE WITH 1 VIEW CHEST COMPARISON:  11/25/2023 FINDINGS: Nonobstructive bowel-gas pattern. No evidence of free air. No radiopaque calculi identified. Pelvic phleboliths. Cholecystectomy clips in the right upper quadrant. Heart size and mediastinal contours are unchanged. Hiatal hernia. Linear scarring/subsegmental atelectasis again noted at the left lung base. Aortic atherosclerosis. Levoscoliotic curvature of the thoracolumbar spine. IMPRESSION: 1. Nonobstructive bowel-gas pattern. 2. Hiatal hernia. 3. No acute cardiopulmonary findings. Linear scarring/subsegmental atelectasis again noted at the left lung base. Electronically Signed   By: Harrietta Sherry M.D.   On: 12/01/2023 14:56   CT Angio Chest PE W and/or Wo Contrast Result Date: 11/25/2023 CLINICAL DATA:  Pulmonary embolus suspected with high probability. Recent discharge from [facility] for low potassium, pneumonia, and bacterial infection of  GI tract. Acute nonlocalized abdominal pain. EXAM: CT ANGIOGRAPHY CHEST CT ABDOMEN AND PELVIS WITH CONTRAST TECHNIQUE: Multidetector CT imaging of the chest was performed using the standard protocol during bolus administration of intravenous contrast. Multiplanar CT image reconstructions and MIPs were obtained to evaluate the vascular anatomy. Multidetector CT imaging of the abdomen and pelvis was performed using the standard protocol during bolus administration of intravenous contrast. RADIATION DOSE REDUCTION:  This exam was performed according to the departmental dose-optimization program which includes automated exposure control, adjustment of the mA and/or kV according to patient size and/or use of iterative reconstruction technique. CONTRAST:  OMNIPAQUE  IOHEXOL  350 MG/ML SOLN COMPARISON:  Chest radiograph 11/25/2023. CT abdomen and pelvis 09/20/2023. CT chest 07/05/2011 FINDINGS: CTA CHEST FINDINGS Cardiovascular: Technically adequate study with good opacification of the central and segmental pulmonary arteries. Moderate motion artifact. No focal filling defects. No evidence of significant pulmonary embolus. Cardiac enlargement. Small pericardial effusion. Normal caliber thoracic aorta. No aortic dissection. Great vessel origins are patent. Calcification of the aorta and coronary arteries. Mediastinum/Nodes: Large esophageal hiatal hernia. Esophagus is decompressed. No significant lymphadenopathy. Thyroid gland is unremarkable. Lungs/Pleura: Trace left pleural effusion. Atelectasis in the left base. No pneumothorax. Musculoskeletal: Postoperative changes in the cervical spine. Degenerative changes in the thoracic spine. No acute bony abnormalities. Review of the MIP images confirms the above findings. CT ABDOMEN and PELVIS FINDINGS Hepatobiliary: No focal liver abnormality is seen. Status post cholecystectomy. No biliary dilatation. Pancreas: Unremarkable. No pancreatic ductal dilatation or surrounding  inflammatory changes. Spleen: Normal in size without focal abnormality. Adrenals/Urinary Tract: Adrenal glands are unremarkable. Kidneys are normal, without renal calculi, focal lesion, or hydronephrosis. Bladder is unremarkable. Stomach/Bowel: Stomach, small bowel, and colon are not abnormally distended. Under distention limits evaluation but there is evidence of wall thickening in the rectosigmoid region which likely indicates colitis. This may represent infectious or inflammatory etiologies. No abscess identified. Appendix is not visualized. Vascular/Lymphatic: Aortic atherosclerosis. No enlarged abdominal or pelvic lymph nodes. Reproductive: Status post hysterectomy. No adnexal masses. Other: No free air or free fluid in the abdomen. Abdominal wall musculature appears intact. Edema in the subcutaneous fat. Musculoskeletal: Lumbar scoliosis convex towards the left. Degenerative changes in the lumbar spine. No acute bony abnormalities. Review of the MIP images confirms the above findings. IMPRESSION: 1. No evidence of significant pulmonary embolus. 2. Large esophageal hiatal hernia. 3. Small left pleural effusion with basilar atelectasis. 4. Aortic atherosclerosis. 5. Wall thickening in the rectosigmoid colon suggesting colitis. No abscess. No proximal obstruction. Electronically Signed   By: Elsie Gravely M.D.   On: 11/25/2023 22:21   CT ABDOMEN PELVIS W CONTRAST Result Date: 11/25/2023 CLINICAL DATA:  Pulmonary embolus suspected with high probability. Recent discharge from [facility] for low potassium, pneumonia, and bacterial infection of GI tract. Acute nonlocalized abdominal pain. EXAM: CT ANGIOGRAPHY CHEST CT ABDOMEN AND PELVIS WITH CONTRAST TECHNIQUE: Multidetector CT imaging of the chest was performed using the standard protocol during bolus administration of intravenous contrast. Multiplanar CT image reconstructions and MIPs were obtained to evaluate the vascular anatomy. Multidetector CT  imaging of the abdomen and pelvis was performed using the standard protocol during bolus administration of intravenous contrast. RADIATION DOSE REDUCTION: This exam was performed according to the departmental dose-optimization program which includes automated exposure control, adjustment of the mA and/or kV according to patient size and/or use of iterative reconstruction technique. CONTRAST:  OMNIPAQUE  IOHEXOL  350 MG/ML SOLN COMPARISON:  Chest radiograph 11/25/2023. CT abdomen and pelvis 09/20/2023. CT chest 07/05/2011 FINDINGS: CTA CHEST FINDINGS Cardiovascular: Technically adequate study with good opacification of the central and segmental pulmonary arteries. Moderate motion artifact. No focal filling defects. No evidence of significant pulmonary embolus. Cardiac enlargement. Small pericardial effusion. Normal caliber thoracic aorta. No aortic dissection. Great vessel origins are patent. Calcification of the aorta and coronary arteries. Mediastinum/Nodes: Large esophageal hiatal hernia. Esophagus is decompressed. No significant lymphadenopathy. Thyroid gland is unremarkable. Lungs/Pleura:  Trace left pleural effusion. Atelectasis in the left base. No pneumothorax. Musculoskeletal: Postoperative changes in the cervical spine. Degenerative changes in the thoracic spine. No acute bony abnormalities. Review of the MIP images confirms the above findings. CT ABDOMEN and PELVIS FINDINGS Hepatobiliary: No focal liver abnormality is seen. Status post cholecystectomy. No biliary dilatation. Pancreas: Unremarkable. No pancreatic ductal dilatation or surrounding inflammatory changes. Spleen: Normal in size without focal abnormality. Adrenals/Urinary Tract: Adrenal glands are unremarkable. Kidneys are normal, without renal calculi, focal lesion, or hydronephrosis. Bladder is unremarkable. Stomach/Bowel: Stomach, small bowel, and colon are not abnormally distended. Under distention limits evaluation but there is evidence  of wall thickening in the rectosigmoid region which likely indicates colitis. This may represent infectious or inflammatory etiologies. No abscess identified. Appendix is not visualized. Vascular/Lymphatic: Aortic atherosclerosis. No enlarged abdominal or pelvic lymph nodes. Reproductive: Status post hysterectomy. No adnexal masses. Other: No free air or free fluid in the abdomen. Abdominal wall musculature appears intact. Edema in the subcutaneous fat. Musculoskeletal: Lumbar scoliosis convex towards the left. Degenerative changes in the lumbar spine. No acute bony abnormalities. Review of the MIP images confirms the above findings. IMPRESSION: 1. No evidence of significant pulmonary embolus. 2. Large esophageal hiatal hernia. 3. Small left pleural effusion with basilar atelectasis. 4. Aortic atherosclerosis. 5. Wall thickening in the rectosigmoid colon suggesting colitis. No abscess. No proximal obstruction. Electronically Signed   By: Elsie Gravely M.D.   On: 11/25/2023 22:21   DG Chest Port 1 View Result Date: 11/25/2023 EXAM: 1 VIEW(S) XRAY OF THE CHEST 11/25/2023 08:15:00 PM COMPARISON: 07/06/2023 CLINICAL HISTORY: Questionable sepsis - evaluate for abnormality. FINDINGS: LUNGS AND PLEURA: No focal pulmonary opacity. No pulmonary edema. No pleural effusion. No pneumothorax. HEART AND MEDIASTINUM: Normal heart size and mediastinal contours. Atherosclerotic calcifications. BONES AND SOFT TISSUES: Cervical fusion hardware is noted. No acute osseous abnormality. DIAPHRAGM AND UPPER ABDOMEN: A small hiatal hernia is present. IMPRESSION: 1. No acute abnormalities. Electronically signed by: Andrea Gasman MD 11/25/2023 08:25 PM EDT RP Workstation: [id number]    Microbiology: Results for orders placed or performed during the hospital encounter of 11/25/23  Resp panel by RT-PCR (RSV, Flu A&B, Covid) Anterior Nasal Swab     Status: None   Collection Time: 11/25/23  8:07 PM   Specimen: Anterior Nasal Swab   Result Value Ref Range Status   SARS Coronavirus 2 by RT PCR NEGATIVE NEGATIVE Final    Comment: (NOTE) SARS-CoV-2 target nucleic acids are NOT DETECTED.  The SARS-CoV-2 RNA is generally detectable in upper respiratory specimens during the acute phase of infection. The lowest concentration of SARS-CoV-2 viral copies this assay can detect is 138 copies/mL. A negative result does not preclude SARS-Cov-2 infection and should not be used as the sole basis for treatment or other patient management decisions. A negative result may occur with  improper specimen collection/handling, submission of specimen other than nasopharyngeal swab, presence of viral mutation(s) within the areas targeted by this assay, and inadequate number of viral copies(<138 copies/mL). A negative result must be combined with clinical observations, patient history, and epidemiological information. The expected result is Negative.  Fact Sheet for Patients:  bloggercourse.com  Fact Sheet for Healthcare Providers:  seriousbroker.it  This test is no t yet approved or cleared by the [place]  FDA and  has been authorized for detection and/or diagnosis of SARS-CoV-2 by FDA under an Emergency Use Authorization (EUA). This EUA will remain  in effect (meaning this test can be used) for the duration  of the COVID-19 declaration under Section 564(b)(1) of the Act, 21 U.S.C.section 360bbb-3(b)(1), unless the authorization is terminated  or revoked sooner.       Influenza A by PCR NEGATIVE NEGATIVE Final   Influenza B by PCR NEGATIVE NEGATIVE Final    Comment: (NOTE) The Xpert Xpress SARS-CoV-2/FLU/RSV plus assay is intended as an aid in the diagnosis of influenza from Nasopharyngeal swab specimens and should not be used as a sole basis for treatment. Nasal washings and aspirates are unacceptable for Xpert Xpress SARS-CoV-2/FLU/RSV testing.  Fact Sheet for  Patients: bloggercourse.com  Fact Sheet for Healthcare Providers: seriousbroker.it  This test is not yet approved or cleared by the [place]  FDA and has been authorized for detection and/or diagnosis of SARS-CoV-2 by FDA under an Emergency Use Authorization (EUA). This EUA will remain in effect (meaning this test can be used) for the duration of the COVID-19 declaration under Section 564(b)(1) of the Act, 21 U.S.C. section 360bbb-3(b)(1), unless the authorization is terminated or revoked.     Resp Syncytial Virus by PCR NEGATIVE NEGATIVE Final    Comment: (NOTE) Fact Sheet for Patients: bloggercourse.com  Fact Sheet for Healthcare Providers: seriousbroker.it  This test is not yet approved or cleared by the [place]  FDA and has been authorized for detection and/or diagnosis of SARS-CoV-2 by FDA under an Emergency Use Authorization (EUA). This EUA will remain in effect (meaning this test can be used) for the duration of the COVID-19 declaration under Section 564(b)(1) of the Act, 21 U.S.C. section 360bbb-3(b)(1), unless the authorization is terminated or revoked.  Performed at [facility], [address], [place], [place] [address]   Blood Culture (routine x 2)     Status: None   Collection Time: 11/25/23  8:08 PM   Specimen: BLOOD  Result Value Ref Range Status   Specimen Description BLOOD LEFT ANTECUBITAL  Final   Special Requests   Final    BOTTLES DRAWN AEROBIC AND ANAEROBIC Blood Culture adequate volume   Culture   Final    NO GROWTH 5 DAYS Performed at [facility], [address], [place], [place] [address]    Report Status 11/30/2023 FINAL  Final  Blood Culture (routine x 2)     Status: None   Collection Time: 11/25/23  9:34 PM   Specimen: BLOOD  Result Value Ref Range Status   Specimen Description BLOOD RIGHT ANTECUBITAL  Final   Special Requests   Final     BOTTLES DRAWN AEROBIC AND ANAEROBIC Blood Culture adequate volume   Culture   Final    NO GROWTH 5 DAYS Performed at [facility], [address], [place], [place] [address]    Report Status 11/30/2023 FINAL  Final    Labs: CBC: Recent Labs  Lab 12/21/23 1452 12/23/23 0359 12/24/23 0434  WBC 11.0* 16.0* 8.1  NEUTROABS  --  13.5*  --   HGB 10.9* 11.8* 11.2*  HCT 35.5* 36.7 35.2*  MCV 88.1 84.6 86.1  PLT 421* 480* 402*   Basic Metabolic Panel: Recent Labs  Lab 12/21/23 1452 12/23/23 0457 12/24/23 0434  NA 142 143 141  K 3.5 2.9* 3.6  CL 105 103 105  CO2 27 28 23   GLUCOSE 79 109* 93  BUN 12 9 7*  CREATININE 0.63 0.85 0.58  CALCIUM  8.9 9.9 9.2  MG  --  2.1  --    Liver Function Tests: Recent Labs  Lab 12/23/23 0457 12/24/23 0434  AST 15 16  ALT 9 9  ALKPHOS 84 75  BILITOT <0.2 0.2  PROT 7.3 6.3*  ALBUMIN 3.7 3.3*   CBG: No results for input(s): GLUCAP in the last 168 hours.  Discharge time spent: {LESS THAN/GREATER UYJW:73611} 30 minutes.  Signed: Bernardino KATHEE Come, MD [facility] 12/24/2023

## 2023-12-25 ENCOUNTER — Telehealth (INDEPENDENT_AMBULATORY_CARE_PROVIDER_SITE_OTHER): Payer: Self-pay

## 2023-12-25 MED ORDER — PEG 3350-KCL-NA BICARB-NACL 420 G PO SOLR: 4000.0000 mL | Freq: Once | ORAL | 0 refills | Status: AC

## 2023-12-25 NOTE — Telephone Encounter (Signed)
 Spoke with pt. She is aware I am cancelling current rx for prep at pharmacy and will mail new instructions. She then stated was having a lot of abd pain. Transferred to crystal's phone.   Called [facility] and currently closed to cancel suprep rx. Will call back

## 2023-12-25 NOTE — Telephone Encounter (Signed)
 I tried calling the patient and no answer. I did get her Voice mail and I did leave her a detailed message per Mitzie Boettcher, Np, stating If she has developed a fever and has ongoing severe abdominal pain/dark stools (which could indicate bleeding) I would recommend she go back to the ER as she is already on multiple medications to help with her GI symptoms and these are still uncontrolled. She was also referred to [facility] for further evaluation of her hiatal hernia/abdominal pain, has she heard from them regarding an appt yet? I did leave on the message that the office closes at 5 pm, and I asked that she please call us  back to let us  know she did receive this message.

## 2023-12-25 NOTE — Telephone Encounter (Signed)
  Patient calling today due to Mid periumbilical pain. Asking that we send in something for pain to [address] [place], [place].  Patient with history of acute gastric ulcer, and was seen by Dr. Eartha on December 17, 2023. Patient states she is in pretty severe pain, through out the night and into today. She says she has been nauseated,taking phenergan  prn,denies any vomiting. She was discharged from the hospital yesterday. She has taken some Tylenol  today, which she says did not help with the pain. She was instructed to not take any NSAIDS. She reports she had a temp today of 100. She has had some loose stools, and says they are dark,but not black and has not seen any bright red blood per rectum. Please advise.

## 2023-12-26 NOTE — Telephone Encounter (Signed)
 I spoke with the patient and made her aware per Mitzie Boettcher, NP,   If she has developed a fever and has ongoing severe abdominal pain/dark stools (which could indicate bleeding) I would recommend she go back to the ER as she is already on multiple medications to help with her GI symptoms and these are still uncontrolled. She was also referred to [facility] for further evaluation of her hiatal hernia/abdominal pain, has she heard from them regarding an appt yet?   Patient states understanding of all. She did say she had heard from [facility] and they gave her an appointment there to follow up on hiatal hernia on January 20, 2024, but she wants to know if you can make a referral to [facility] instead,because she can get financial assistance at [facility]. Please advise.   I also advised that she had a Colonoscopy scheduled for January 20, 2024 with Dr. Eartha. She says she does not know if she can do this as she will not be able to drink the prep as it makes her sick. Please advise.

## 2023-12-26 NOTE — Telephone Encounter (Signed)
 I called and left a VM, asked that the patient please return call to the office. I did leave on the message that the office would be closing today at 11 am.

## 2023-12-28 ENCOUNTER — Emergency Department (HOSPITAL_COMMUNITY)

## 2023-12-28 ENCOUNTER — Inpatient Hospital Stay (HOSPITAL_COMMUNITY)
Admission: EM | Admit: 2023-12-28 | Discharge: 2024-01-01 | DRG: 391 | Disposition: A | Discharge status: Home / Self Care | Attending: Internal Medicine | Admitting: Internal Medicine

## 2023-12-28 ENCOUNTER — Other Ambulatory Visit: Payer: Self-pay

## 2023-12-28 ENCOUNTER — Encounter (HOSPITAL_COMMUNITY): Payer: Self-pay

## 2023-12-28 DIAGNOSIS — K529 Noninfective gastroenteritis and colitis, unspecified: Secondary | ICD-10-CM | POA: Diagnosis not present

## 2023-12-28 DIAGNOSIS — F1120 Opioid dependence, uncomplicated: Secondary | ICD-10-CM | POA: Diagnosis present

## 2023-12-28 DIAGNOSIS — K625 Hemorrhage of anus and rectum: Secondary | ICD-10-CM

## 2023-12-28 DIAGNOSIS — Z90710 Acquired absence of both cervix and uterus: Secondary | ICD-10-CM

## 2023-12-28 DIAGNOSIS — F17210 Nicotine dependence, cigarettes, uncomplicated: Secondary | ICD-10-CM | POA: Diagnosis present

## 2023-12-28 DIAGNOSIS — N189 Chronic kidney disease, unspecified: Secondary | ICD-10-CM | POA: Diagnosis present

## 2023-12-28 DIAGNOSIS — K2210 Ulcer of esophagus without bleeding: Secondary | ICD-10-CM | POA: Diagnosis present

## 2023-12-28 DIAGNOSIS — G894 Chronic pain syndrome: Secondary | ICD-10-CM | POA: Diagnosis present

## 2023-12-28 DIAGNOSIS — K254 Chronic or unspecified gastric ulcer with hemorrhage: Secondary | ICD-10-CM | POA: Diagnosis present

## 2023-12-28 DIAGNOSIS — Z885 Allergy status to narcotic agent status: Secondary | ICD-10-CM

## 2023-12-28 DIAGNOSIS — Z888 Allergy status to other drugs, medicaments and biological substances status: Secondary | ICD-10-CM

## 2023-12-28 DIAGNOSIS — Z79899 Other long term (current) drug therapy: Secondary | ICD-10-CM

## 2023-12-28 DIAGNOSIS — Z5948 Other specified lack of adequate food: Secondary | ICD-10-CM

## 2023-12-28 DIAGNOSIS — Z8052 Family history of malignant neoplasm of bladder: Secondary | ICD-10-CM

## 2023-12-28 DIAGNOSIS — G2581 Restless legs syndrome: Secondary | ICD-10-CM | POA: Diagnosis present

## 2023-12-28 DIAGNOSIS — Z5941 Food insecurity: Secondary | ICD-10-CM

## 2023-12-28 DIAGNOSIS — R152 Fecal urgency: Secondary | ICD-10-CM | POA: Diagnosis present

## 2023-12-28 DIAGNOSIS — E860 Dehydration: Secondary | ICD-10-CM | POA: Diagnosis present

## 2023-12-28 DIAGNOSIS — M797 Fibromyalgia: Secondary | ICD-10-CM | POA: Diagnosis present

## 2023-12-28 DIAGNOSIS — G8929 Other chronic pain: Secondary | ICD-10-CM

## 2023-12-28 DIAGNOSIS — D122 Benign neoplasm of ascending colon: Secondary | ICD-10-CM | POA: Diagnosis present

## 2023-12-28 DIAGNOSIS — Z881 Allergy status to other antibiotic agents status: Secondary | ICD-10-CM

## 2023-12-28 DIAGNOSIS — K257 Chronic gastric ulcer without hemorrhage or perforation: Secondary | ICD-10-CM

## 2023-12-28 DIAGNOSIS — I10 Essential (primary) hypertension: Secondary | ICD-10-CM | POA: Diagnosis present

## 2023-12-28 DIAGNOSIS — K5909 Other constipation: Secondary | ICD-10-CM | POA: Diagnosis present

## 2023-12-28 DIAGNOSIS — G40909 Epilepsy, unspecified, not intractable, without status epilepticus: Secondary | ICD-10-CM | POA: Diagnosis present

## 2023-12-28 DIAGNOSIS — N182 Chronic kidney disease, stage 2 (mild): Secondary | ICD-10-CM | POA: Diagnosis present

## 2023-12-28 DIAGNOSIS — F32A Depression, unspecified: Secondary | ICD-10-CM | POA: Diagnosis present

## 2023-12-28 DIAGNOSIS — Z882 Allergy status to sulfonamides status: Secondary | ICD-10-CM

## 2023-12-28 DIAGNOSIS — E876 Hypokalemia: Secondary | ICD-10-CM | POA: Diagnosis not present

## 2023-12-28 DIAGNOSIS — G500 Trigeminal neuralgia: Secondary | ICD-10-CM | POA: Diagnosis present

## 2023-12-28 DIAGNOSIS — Z9049 Acquired absence of other specified parts of digestive tract: Secondary | ICD-10-CM

## 2023-12-28 DIAGNOSIS — K449 Diaphragmatic hernia without obstruction or gangrene: Secondary | ICD-10-CM | POA: Diagnosis present

## 2023-12-28 DIAGNOSIS — F411 Generalized anxiety disorder: Secondary | ICD-10-CM | POA: Diagnosis present

## 2023-12-28 DIAGNOSIS — Z808 Family history of malignant neoplasm of other organs or systems: Secondary | ICD-10-CM

## 2023-12-28 DIAGNOSIS — E785 Hyperlipidemia, unspecified: Secondary | ICD-10-CM | POA: Diagnosis present

## 2023-12-28 DIAGNOSIS — R112 Nausea with vomiting, unspecified: Secondary | ICD-10-CM | POA: Diagnosis not present

## 2023-12-28 DIAGNOSIS — K219 Gastro-esophageal reflux disease without esophagitis: Secondary | ICD-10-CM | POA: Diagnosis present

## 2023-12-28 DIAGNOSIS — Z981 Arthrodesis status: Secondary | ICD-10-CM

## 2023-12-28 DIAGNOSIS — Z79891 Long term (current) use of opiate analgesic: Secondary | ICD-10-CM

## 2023-12-28 DIAGNOSIS — I130 Hypertensive heart and chronic kidney disease with heart failure and stage 1 through stage 4 chronic kidney disease, or unspecified chronic kidney disease: Secondary | ICD-10-CM | POA: Diagnosis present

## 2023-12-28 DIAGNOSIS — Z800 Family history of malignant neoplasm of digestive organs: Secondary | ICD-10-CM

## 2023-12-28 LAB — COMPREHENSIVE METABOLIC PANEL WITH GFR
ALT: <5 U/L (ref 0–44)
AST: 11 U/L — ABNORMAL LOW (ref 15–41)
Albumin: 3.5 g/dL (ref 3.5–5.0)
Alkaline Phosphatase: 78 U/L (ref 38–126)
Anion gap: 13 (ref 5–15)
BUN: 12 mg/dL (ref 8–23)
CO2: 21 mmol/L — ABNORMAL LOW (ref 22–32)
Calcium: 9.1 mg/dL (ref 8.9–10.3)
Chloride: 107 mmol/L (ref 98–111)
Creatinine, Ser: 0.66 mg/dL (ref 0.44–1.00)
GFR, Estimated: >60 mL/min (ref >60)
Glucose, Bld: 86 mg/dL (ref 70–99)
Potassium: 2.7 mmol/L — CL (ref 3.5–5.1)
Sodium: 141 mmol/L (ref 135–145)
Total Bilirubin: <0.2 mg/dL (ref 0.0–1.2)
Total Protein: 6.3 g/dL — ABNORMAL LOW (ref 6.5–8.1)

## 2023-12-28 LAB — CBC
HCT: 34 % — ABNORMAL LOW (ref 36.0–46.0)
Hemoglobin: 10.8 g/dL — ABNORMAL LOW (ref 12.0–15.0)
MCH: 26.9 pg (ref 26.0–34.0)
MCHC: 31.8 g/dL (ref 30.0–36.0)
MCV: 84.6 fL (ref 80.0–100.0)
Platelets: 481 K/uL — ABNORMAL HIGH (ref 150–400)
RBC: 4.02 MIL/uL (ref 3.87–5.11)
RDW: 14.7 % (ref 11.5–15.5)
WBC: 14.5 K/uL — ABNORMAL HIGH (ref 4.0–10.5)
nRBC: 0 % (ref 0.0–0.2)

## 2023-12-28 LAB — TYPE AND SCREEN
ABO/RH(D): O POS
Antibody Screen: NEGATIVE

## 2023-12-28 LAB — PROTIME-INR
INR: 0.9 (ref 0.8–1.2)
Prothrombin Time: 12.1 s (ref 11.4–15.2)

## 2023-12-28 LAB — LIPASE, BLOOD: Lipase: 18 U/L (ref 11–51)

## 2023-12-28 LAB — TROPONIN T, HIGH SENSITIVITY: Troponin T High Sensitivity: <15 ng/L (ref 0–19)

## 2023-12-28 MED ORDER — KCL IN DEXTROSE-NACL 40-5-0.45 MEQ/L-%-% IV SOLN
INTRAVENOUS | Status: AC
  Filled 2023-12-28 (×3): qty 1000

## 2023-12-28 MED ORDER — FENTANYL CITRATE (PF) 100 MCG/2ML IJ SOLN
12.5000 ug | INTRAMUSCULAR | Status: DC | PRN
Start: 2023-12-28 — End: 2023-12-28

## 2023-12-28 MED ORDER — FENTANYL CITRATE (PF) 50 MCG/ML IJ SOSY: 25.0000 ug | PREFILLED_SYRINGE | INTRAMUSCULAR | Status: DC | PRN

## 2023-12-28 MED ORDER — DULOXETINE HCL 60 MG PO CPEP
60.0000 mg | ORAL_CAPSULE | Freq: Two times a day (BID) | ORAL | Status: DC
  Administered 2023-12-28 – 2024-01-01 (×9): 60 mg via ORAL
  Filled 2023-12-28 (×9): qty 1

## 2023-12-28 MED ORDER — ACETAMINOPHEN 325 MG PO TABS: 650.0000 mg | ORAL_TABLET | Freq: Four times a day (QID) | ORAL | Status: DC | PRN

## 2023-12-28 MED ORDER — ACETAMINOPHEN 650 MG RE SUPP: 650.0000 mg | Freq: Four times a day (QID) | RECTAL | Status: DC | PRN

## 2023-12-28 MED ORDER — DIPHENHYDRAMINE HCL 50 MG/ML IJ SOLN
12.5000 mg | Freq: Once | INTRAMUSCULAR | Status: AC
Start: 2023-12-28 — End: 2023-12-28
  Administered 2023-12-28: 12.5 mg via INTRAVENOUS
  Filled 2023-12-28: qty 1

## 2023-12-28 MED ORDER — PANTOPRAZOLE SODIUM 40 MG IV SOLR
40.0000 mg | Freq: Two times a day (BID) | INTRAVENOUS | Status: DC
  Administered 2023-12-28 – 2024-01-01 (×9): 40 mg via INTRAVENOUS
  Filled 2023-12-28 (×9): qty 10

## 2023-12-28 MED ORDER — HYDROCODONE-ACETAMINOPHEN 5-325 MG PO TABS
1.0000 | ORAL_TABLET | ORAL | Status: DC | PRN
  Administered 2023-12-28 – 2024-01-01 (×17): 2 via ORAL
  Filled 2023-12-28 (×17): qty 2

## 2023-12-28 MED ORDER — PROCHLORPERAZINE EDISYLATE 10 MG/2ML IJ SOLN
10.0000 mg | Freq: Four times a day (QID) | INTRAMUSCULAR | Status: DC | PRN
  Administered 2023-12-29 – 2024-01-01 (×4): 10 mg via INTRAVENOUS
  Filled 2023-12-28 (×5): qty 2

## 2023-12-28 MED ORDER — LABETALOL HCL 200 MG PO TABS
200.0000 mg | ORAL_TABLET | Freq: Two times a day (BID) | ORAL | Status: DC
  Administered 2023-12-28 – 2023-12-31 (×7): 200 mg via ORAL
  Filled 2023-12-28 (×7): qty 1

## 2023-12-28 MED ORDER — SUCRALFATE 1 GM/10ML PO SUSP
1.0000 g | Freq: Three times a day (TID) | ORAL | Status: DC
  Administered 2023-12-28 – 2024-01-01 (×15): 1 g via ORAL
  Filled 2023-12-28 (×15): qty 10

## 2023-12-28 MED ORDER — ROPINIROLE HCL 1 MG PO TABS
4.0000 mg | ORAL_TABLET | Freq: Every day | ORAL | Status: DC
  Administered 2023-12-28 – 2023-12-31 (×4): 4 mg via ORAL
  Filled 2023-12-28 (×4): qty 4

## 2023-12-28 MED ORDER — SPIRONOLACTONE 25 MG PO TABS
25.0000 mg | ORAL_TABLET | Freq: Every day | ORAL | Status: DC
  Administered 2023-12-28 – 2024-01-01 (×5): 25 mg via ORAL
  Filled 2023-12-28 (×5): qty 1

## 2023-12-28 MED ORDER — LISINOPRIL 10 MG PO TABS
10.0000 mg | ORAL_TABLET | Freq: Every day | ORAL | Status: DC
  Administered 2023-12-28 – 2024-01-01 (×5): 10 mg via ORAL
  Filled 2023-12-28 (×5): qty 1

## 2023-12-28 MED ORDER — ROPINIROLE HCL 1 MG PO TABS
2.0000 mg | ORAL_TABLET | Freq: Every day | ORAL | Status: DC
  Administered 2023-12-28 – 2024-01-01 (×5): 2 mg via ORAL
  Filled 2023-12-28 (×5): qty 2

## 2023-12-28 MED ORDER — PROCHLORPERAZINE EDISYLATE 10 MG/2ML IJ SOLN
10.0000 mg | Freq: Once | INTRAMUSCULAR | Status: AC
  Administered 2023-12-28: 10 mg via INTRAVENOUS
  Filled 2023-12-28: qty 2

## 2023-12-28 MED ORDER — AMLODIPINE BESYLATE 5 MG PO TABS
10.0000 mg | ORAL_TABLET | Freq: Every day | ORAL | Status: DC
  Administered 2023-12-28 – 2024-01-01 (×5): 10 mg via ORAL
  Filled 2023-12-28 (×5): qty 2

## 2023-12-28 MED ORDER — HYDROMORPHONE HCL 1 MG/ML IJ SOLN
1.0000 mg | Freq: Once | INTRAMUSCULAR | Status: AC
  Administered 2023-12-28: 1 mg via INTRAVENOUS
  Filled 2023-12-28: qty 1

## 2023-12-28 MED ORDER — ENOXAPARIN SODIUM 30 MG/0.3ML IJ SOSY
30.0000 mg | PREFILLED_SYRINGE | INTRAMUSCULAR | Status: DC
  Administered 2023-12-28 – 2023-12-31 (×4): 30 mg via SUBCUTANEOUS
  Filled 2023-12-28 (×4): qty 0.3

## 2023-12-28 MED ORDER — PHENYTOIN SODIUM EXTENDED 100 MG PO CAPS
100.0000 mg | ORAL_CAPSULE | Freq: Two times a day (BID) | ORAL | Status: DC
  Administered 2023-12-28 – 2024-01-01 (×9): 100 mg via ORAL
  Filled 2023-12-28 (×9): qty 1

## 2023-12-28 MED ORDER — SODIUM CHLORIDE 0.9 % IV BOLUS
1000.0000 mL | Freq: Once | INTRAVENOUS | Status: AC
  Administered 2023-12-28: 1000 mL via INTRAVENOUS

## 2023-12-28 MED ORDER — POTASSIUM CHLORIDE 10 MEQ/100ML IV SOLN
10.0000 meq | INTRAVENOUS | Status: AC
  Administered 2023-12-28 (×2): 10 meq via INTRAVENOUS
  Filled 2023-12-28 (×2): qty 100

## 2023-12-28 NOTE — Progress Notes (Signed)
 Pt arrived to room 341 via stretcher from ED. Pt able to ambulate with one assist from stretcher to chair and tolerated activity without pain or SOB. VSS, pt A&O. IV site without s/s infiltration. Pt oriented to room and safety precautions, states understanding. Bed alarm on and call bell within reach for safety, advised to call for needs.

## 2023-12-28 NOTE — Plan of Care (Signed)

## 2023-12-28 NOTE — H&P (Signed)
 History and Physical    Patient: Renee Gutierrez FMW:[id number] DOB: May 01, 1960 DOA: 12/28/2023 DOS: the patient was seen and examined on 12/28/2023 PCP: Patient, No Pcp Per  Patient coming from: Home  Chief Complaint:  Chief Complaint  Patient presents with   Chest Pain   Abdominal Pain   Emesis   HPI: Renee Gutierrez is a 63 y.o. female with medical history significant of GERD, IBS, left facial pain, trigeminal neuralgia, cerebral AVM involving brainstem, hiatal hernia status post Nissen fundoplication 20 years ago, recurrent nausea vomiting and recent multiple hospitalizations, discharge 2 days ago with similar complaints comes back to the emergency room with sudden onset of nausea vomiting, chest pain and abdominal pain started today morning.  Admitted with similar issues in October twice, EGD found to have large ulcer distal to her esophagus.  She had bleeding from the ulcer.  Patient stated going home in good condition 2 days ago and able to tolerate diet, she ate some chicken finger strips last night and started similar intractable nausea, anterior chest wall pain, abdominal pain and multiple episode of vomiting and some streaks of blood with one of the vomiting.  Patient is on chronic pain management.  Stated compliance to medications does not know what triggers the events but this time it was 16.  In the emergency room, blood pressure is stable and elevated.  WC count 14.5, hemoglobin 12.8, potassium 2.7.  KUB without any complications.  Patient was given 1 L fluid bolus, 20 mEq potassium, 1 dose of Dilaudid  and Compazine .  Persistently symptomatic.  Admitted for treatment.  Review of Systems: As mentioned in the history of present illness. All other systems reviewed and are negative. She had normal bowel movements.  Urinating normally.  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   Past Surgical History:  Procedure Laterality Date   ABDOMINAL HYSTERECTOMY     ABDOMINAL SURGERY     tumor removed   APPENDECTOMY     BRAIN SURGERY     to fix AVM   CEREBRAL ANEURYSM REPAIR     CERVICAL FUSION     CESAREAN SECTION     CHOLECYSTECTOMY     ESOPHAGEAL DILATION N/A 11/28/2023   Procedure: DILATION, ESOPHAGUS;  Surgeon: Cindie Carlin POUR, DO;  [facility];  Service: Endoscopy;  Laterality: N/A;   ESOPHAGOGASTRODUODENOSCOPY N/A 11/28/2023   Procedure: EGD (ESOPHAGOGASTRODUODENOSCOPY);  Surgeon: Cindie Carlin POUR, DO;  Location: [facility];  Service: Endoscopy;  Laterality: N/A;   ESOPHAGOGASTRODUODENOSCOPY N/A 12/05/2023   Procedure: EGD (ESOPHAGOGASTRODUODENOSCOPY);  Surgeon: Eartha Flavors, Toribio, MD;  Location: [facility];  Service: Gastroenterology;  Laterality: N/A;   LUNG SURGERY     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   Social History:  reports that she has been smoking cigarettes. She has never used smokeless tobacco. She reports that she does not drink alcohol and does  not use drugs.  Allergies  Allergen Reactions   Buprenorphine Hcl Swelling    All extremities and neck and face swelled up huge according to patient   Bupropion Swelling and Other (See Comments)    Moody and depressed   Codeine Itching, Nausea And Vomiting, Nausea Only and Swelling   Sulfamethoxazole-Trimethoprim Swelling and Other (See Comments)    Swelled up her eye   Nsaids Other (See Comments)    Unknown    Pregabalin  Swelling    Lyrica     Sulfa Antibiotics Other (See Comments)    Unknown     Tapentadol Nausea And Vomiting    Pt states she can take   Clindamycin Other (See Comments)    Heart burn   Clindamycin/Lincomycin Rash and Other (See Comments)    Heart burn   Cymbalta  [Duloxetine  Hcl] Swelling   Diclofenac Swelling    Cream    Diclofenac Sodium Swelling    Oral med   Gabapentin Swelling   Oxycodone  Nausea Only    No longer has a reaction per pt   Toradol  [Ketorolac  Tromethamine ] Rash    Family History  Problem Relation Age of Onset   Throat cancer Mother    Bladder Cancer Father    Liver cancer Father    Colon cancer Neg Hx     Prior to Admission medications   Medication Sig Start Date End Date Taking? Authorizing Provider  amLODipine  (NORVASC ) 10 MG tablet Take 1 tablet (10 mg total) by mouth daily. 12/15/23   Arrien, Mauricio Daniel, MD  DULoxetine  (CYMBALTA ) 60 MG capsule Take 60 mg by mouth 2 (two) times daily. 05/16/20   [provider]  erythromycin  ophthalmic ointment Place 1 Application into the left eye 3 (three) times daily. 12/14/23   Arrien, Mauricio Daniel, MD  labetalol  (NORMODYNE ) 200 MG tablet Take 1 tablet (200 mg total) by mouth 2 (two) times daily. 12/14/23   Arrien, Elidia Sieving, MD  lisinopril  (ZESTRIL ) 10 MG tablet Take 1 tablet (10 mg total) by mouth daily. 12/14/23   Arrien, Elidia Sieving, MD  morphine  (MS CONTIN ) 15 MG 12 hr tablet Take 15 mg by mouth every 12 (twelve) hours.    [provider]  morphine  (MSIR) 15 MG tablet Take 15 mg by mouth every 4 (four) hours as needed for severe pain (pain score 7-10).    [provider]  ondansetron  (ZOFRAN -ODT) 8 MG disintegrating tablet Take 1 tablet (8 mg total) by mouth every 6 (six) hours as needed for nausea or vomiting. 11/29/23   Darci Pore, MD  pantoprazole  (PROTONIX ) 40 MG tablet Take 1 tablet (40 mg total) by mouth 2 (two) times daily. 11/29/23   Darci Pore, MD  phenytoin  (DILANTIN ) 100 MG ER capsule Take 100 mg by mouth. Patient  taking differently: Take 100 mg by mouth 2 (two) times daily. 12/09/23   [provider]  prednisoLONE  acetate (PRED FORTE ) 1 % ophthalmic suspension Place 1 drop into the left eye in the morning and at bedtime. 12/14/23   Arrien, Mauricio Daniel, MD  pregabalin  (LYRICA ) 50 MG capsule Take 50 mg by mouth 3 (three) times daily. 12/08/23   [provider]  prochlorperazine  (COMPAZINE ) 10 MG tablet Take 1 tablet (10 mg total) by mouth every 6 (six) hours as needed for nausea or vomiting. 12/24/23   Bryn Bernardino NOVAK, MD  rOPINIRole  (REQUIP ) 2 MG tablet Take 4 mg by mouth See admin instructions. Take 1 tablet in the morning and 2 tablet every evening.  11/11/14   [provider]  simethicone (GAS RELIEF EXTRA STRENGTH) 125 MG chewable tablet Chew 125 mg by mouth daily as needed for flatulence.  05/24/14   [provider]  spironolactone (ALDACTONE) 25 MG tablet Take 1 tablet (25 mg total) by mouth daily. 12/14/23   Arrien, Mauricio Daniel, MD  sucralfate (CARAFATE) 1 GM/10ML suspension Take 10 mLs (1 g total) by mouth 4 (four) times daily -  with meals and at bedtime. 12/07/23   Evonnie Lenis, MD    Physical Exam: Vitals:   12/28/23 0944 12/28/23 0948  BP: (!) 179/90   Pulse: 86   Resp: 18   Temp: 98.9 F (37.2 C)   TempSrc: Oral   SpO2: 99%   Weight:  44.5 kg  Height:  4' 11 (1.499 m)   Physical Exam Constitutional:      Appearance: She is ill-appearing.     Comments: Anxious, moderate distress with pain.  HENT:     Head: Normocephalic and atraumatic.  Eyes:     Extraocular Movements: Extraocular movements intact.     Pupils: Pupils are equal, round, and reactive to light.  Neck:     Thyroid: No thyromegaly.     Vascular: No JVD.  Cardiovascular:     Rate and Rhythm: Normal rate and regular rhythm.     Heart sounds: Normal heart sounds. No murmur heard. Pulmonary:     Effort: Pulmonary effort is normal.     Breath sounds: Normal breath sounds. No  wheezing.  Chest:     Chest wall: No tenderness.  Abdominal:     Palpations: Abdomen is soft. There is no fluid wave.     Comments: Mild diffuse tenderness over epigastrium.  No rigidity or guarding.  Bowel sounds present.  Musculoskeletal:        General: Normal range of motion.     Cervical back: Normal range of motion and neck supple.     Right lower leg: No edema.  Lymphadenopathy:     Cervical: No cervical adenopathy.  Skin:    General: Skin is warm and dry.  Neurological:     General: No focal deficit present.     Mental Status: She is alert and oriented to person, place, and time.  Psychiatric:        Behavior: Behavior normal.     Comments: Anxious.  Tearful.     Data Reviewed:      Latest Ref Rng & Units 12/28/2023   10:31 AM 12/24/2023    4:34 AM 12/23/2023    4:57 AM  BMP  Glucose 70 - 99 mg/dL 86  93  890   BUN 8 - 23 mg/dL 12  7  9    Creatinine 0.44 - 1.00 mg/dL 9.33  9.41  9.14   Sodium 135 - 145 mmol/L 141  141  143   Potassium 3.5 - 5.1 mmol/L 2.7  3.6  2.9   Chloride 98 - 111 mmol/L 107  105  103   CO2 22 - 32 mmol/L 21  23  28    Calcium  8.9 - 10.3 mg/dL 9.1  9.2  9.9      Assessment and Plan: No notes have been filed under this hospital service. Service: Hospitalist  Assessment & Plan    Intractable nausea vomiting, upper abdominal pain in a patient with known GERD, hiatal hernia and Nissen fundoplication, known history of upper GI bleeding: Recurrent symptoms, probably aggravated by food intake. Currently does not have any evidence  of complications.  Patient multiple CT scans were done.  KUB without evidence of complication including perforation.  Significant symptoms. Agree for admission for monitoring N.p.o., IV fluids, electrolyte replacement, nausea medications, adequate pain medications with fentanyl . Hemoglobin is stable, started on Protonix  twice daily IV until stabilization.  Continue sucralfate as much she can take by mouth. GI was  consulted by ER, recommended to call them back if complications or symptoms do not improve.  Hypertension: Blood pressures elevated.  Missed morning doses of medications.  Resume all home medications.  Chronic pain syndrome: On MS Contin  and MS IR at home.  Currently unable to take any medications by mouth.  Remains on IV fentanyl .  Will resume these medications when she is safely taking.  Hypokalemia: Severe and persistent.  Replace aggressively IV bolus and on maintenance fluid.  Recheck potassium, magnesium  and phosphorus in the morning labs.      Advance Care Planning:   Code Status: Full Code   Consults: None  Family Communication: Husband at the bedside  Severity of Illness: The appropriate patient status for this patient is OBSERVATION. Observation status is judged to be reasonable and necessary in order to provide the required intensity of service to ensure the patient's safety. The patient's presenting symptoms, physical exam findings, and initial radiographic and laboratory data in the context of their medical condition is felt to place them at decreased risk for further clinical deterioration. Furthermore, it is anticipated that the patient will be medically stable for discharge from the hospital within 2 midnights of admission.   Author: Renato Applebaum, MD 12/28/2023 1:48 PM  For on call review [email or web address].

## 2023-12-28 NOTE — ED Triage Notes (Addendum)
 Patient come in POV for complaint of vomiting, chest pain, abdominal pain was in the hospital last wednesday. States the pain started Friday, pain is in chest belly and face.

## 2023-12-28 NOTE — ED Notes (Signed)
 Called floor 300 to let them know patient is headed up.

## 2023-12-28 NOTE — ED Provider Notes (Signed)
 [place] [facility]ENC[facility] DEPARTMENT AT [facility] Provider Note   CSN: [id number] Arrival date & time: 12/28/23  9063     Patient presents with: Chest Pain, Abdominal Pain, and Emesis  HPI Renee Gutierrez is a 63 y.o. female with recent diagnosis of gastric ulcer, s/p Nissen fundoplication years ago, recent admission for nausea and vomiting, CKD, GERD, chronic pain, CHF, brain AVM, hypertension presenting for upper abdominal pain and hematemesis.  She states her upper abdominal pain has remained unchanged since her discharge 5 days ago.  The pain is in the epigastric region and radiates to the lower part of her chest.  She denies shortness of breath.  Vomiting makes it worse.  Vomiting is started 3 days ago.  She noticed 1 occurrence of bright red blood in her vomit this morning also endorsing diarrhea.  She states that recently had upper endoscopy scope and was found to have a gastric ulcer. Denies fever.  Also endorsing diarrhea as well.    Chest Pain Associated symptoms: abdominal pain and vomiting   Abdominal Pain Associated symptoms: chest pain and vomiting   Emesis Associated symptoms: abdominal pain        Prior to Admission medications   Medication Sig Start Date End Date Taking? Authorizing Provider  amLODipine  (NORVASC ) 10 MG tablet Take 1 tablet (10 mg total) by mouth daily. 12/15/23   Arrien, Mauricio Daniel, MD  DULoxetine  (CYMBALTA ) 60 MG capsule Take 60 mg by mouth 2 (two) times daily. 05/16/20   [provider]  erythromycin  ophthalmic ointment Place 1 Application into the left eye 3 (three) times daily. 12/14/23   Arrien, Mauricio Daniel, MD  labetalol  (NORMODYNE ) 200 MG tablet Take 1 tablet (200 mg total) by mouth 2 (two) times daily. 12/14/23   Arrien, Elidia Sieving, MD  lisinopril  (ZESTRIL ) 10 MG tablet Take 1 tablet (10 mg total) by mouth daily. 12/14/23   Arrien, Mauricio Daniel, MD  morphine  (MS CONTIN ) 15 MG 12 hr tablet Take 15 mg by mouth  every 12 (twelve) hours.    [provider]  morphine  (MSIR) 15 MG tablet Take 15 mg by mouth every 4 (four) hours as needed for severe pain (pain score 7-10).    [provider]  ondansetron  (ZOFRAN -ODT) 8 MG disintegrating tablet Take 1 tablet (8 mg total) by mouth every 6 (six) hours as needed for nausea or vomiting. 11/29/23   Darci Pore, MD  pantoprazole  (PROTONIX ) 40 MG tablet Take 1 tablet (40 mg total) by mouth 2 (two) times daily. 11/29/23   Darci Pore, MD  phenytoin  (DILANTIN ) 100 MG ER capsule Take 100 mg by mouth. Patient taking differently: Take 100 mg by mouth 2 (two) times daily. 12/09/23   [provider]  prednisoLONE  acetate (PRED FORTE ) 1 % ophthalmic suspension Place 1 drop into the left eye in the morning and at bedtime. 12/14/23   Arrien, Mauricio Daniel, MD  pregabalin  (LYRICA ) 50 MG capsule Take 50 mg by mouth 3 (three) times daily. 12/08/23   [provider]  prochlorperazine  (COMPAZINE ) 10 MG tablet Take 1 tablet (10 mg total) by mouth every 6 (six) hours as needed for nausea or vomiting. 12/24/23   Bryn Bernardino NOVAK, MD  rOPINIRole  (REQUIP ) 2 MG tablet Take 4 mg by mouth See admin instructions. Take 1 tablet in the morning and 2 tablet every evening. 11/11/14   [provider]  simethicone (GAS RELIEF EXTRA STRENGTH) 125 MG chewable tablet Chew 125 mg by mouth daily as needed  for flatulence.  05/24/14   [provider]  spironolactone (ALDACTONE) 25 MG tablet Take 1 tablet (25 mg total) by mouth daily. 12/14/23   Arrien, Mauricio Daniel, MD  sucralfate (CARAFATE) 1 GM/10ML suspension Take 10 mLs (1 g total) by mouth 4 (four) times daily -  with meals and at bedtime. 12/07/23   Evonnie Lenis, MD    Allergies: Buprenorphine hcl, Bupropion, Codeine, Sulfamethoxazole-trimethoprim, Nsaids, Pregabalin , Sulfa antibiotics, Tapentadol, Clindamycin, Clindamycin/lincomycin, Cymbalta  [duloxetine  hcl], Diclofenac,  Diclofenac sodium, Gabapentin, Oxycodone , and Toradol  [ketorolac  tromethamine ]    Review of Systems  Cardiovascular:  Positive for chest pain.  Gastrointestinal:  Positive for abdominal pain and vomiting.    Updated Vital Signs BP (!) 179/90 (BP Location: Right Arm)   Pulse 86   Temp 98.9 F (37.2 C) (Oral)   Resp 18   Ht 4' 11 (1.499 m)   Wt 44.5 kg   SpO2 99%   BMI 19.81 kg/m   Physical Exam Vitals and nursing note reviewed.  HENT:     Head: Normocephalic and atraumatic.     Mouth/Throat:     Mouth: Mucous membranes are moist.  Eyes:     General:        Right eye: No discharge.        Left eye: No discharge.     Conjunctiva/sclera: Conjunctivae normal.  Cardiovascular:     Rate and Rhythm: Normal rate and regular rhythm.     Pulses: Normal pulses.     Heart sounds: Normal heart sounds.  Pulmonary:     Effort: Pulmonary effort is normal.     Breath sounds: Normal breath sounds.  Abdominal:     General: Abdomen is flat.     Palpations: Abdomen is soft.     Tenderness: There is abdominal tenderness in the epigastric area.  Skin:    General: Skin is warm and dry.  Neurological:     General: No focal deficit present.  Psychiatric:        Mood and Affect: Mood normal.     (all labs ordered are listed, but only abnormal results are displayed) Labs Reviewed  COMPREHENSIVE METABOLIC PANEL WITH GFR - Abnormal; Notable for the following components:      Result Value   Potassium 2.7 (*)    CO2 21 (*)    Total Protein 6.3 (*)    AST 11 (*)    All other components within normal limits  CBC - Abnormal; Notable for the following components:   WBC 14.5 (*)    Hemoglobin 10.8 (*)    HCT 34.0 (*)    Platelets 481 (*)    All other components within normal limits  LIPASE, BLOOD  URINALYSIS, ROUTINE W REFLEX MICROSCOPIC  PROTIME-INR  TYPE AND SCREEN  TROPONIN T, HIGH SENSITIVITY    EKG: EKG Interpretation Date/Time:  Sunday December 28 2023 09:50:09  EST Ventricular Rate:  85 PR Interval:  146 QRS Duration:  81 QT Interval:  373 QTC Calculation: 444 R Axis:   36  Text Interpretation: Sinus rhythm LVH Anterior Q waves, possibly due to LVH Compared to prior EKG from 12/23/2023 Confirmed by Gennaro Bouchard ([id number]) on 12/28/2023 9:55:21 AM  Radiology: [facility] Abd 2 Views Result Date: 12/28/2023 CLINICAL DATA:  855384 Pain [id number] EXAM: ABDOMEN - 2 VIEW COMPARISON:  Correct over sixteenth 2025, December 01, 2023. FINDINGS: Air and stool-filled nondilated loops of bowel. Mildly edematous appearance of the walls of loops of small bowel. Mild colonic stool burden diffusely  throughout the colon. Visualized lung bases are unremarkable. Favored hiatal hernia. Degenerative changes of the lumbar spine. Status post cholecystectomy. Levoscoliosis of the lumbar spine IMPRESSION: 1. Nonobstructive bowel gas pattern. 2. Mildly edematous appearance of the walls of loops of small bowel. This is nonspecific but could reflect enteritis. Electronically Signed   By: Corean Salter M.D.   On: 12/28/2023 10:46   DG Chest 2 View Result Date: 12/28/2023 CLINICAL DATA:  Chest pain EXAM: DG CHEST 2V COMPARISON:  December 04, 2023 FINDINGS: The cardiomediastinal silhouette is unchanged in contour.Rounded density over the midline likely reflecting a large hiatal hernia. Atherosclerotic calcifications of the aorta. No pleural effusion. No pneumothorax. No acute pleuroparenchymal abnormality. Lucency projecting anterior to the the xiphoid process on lateral radiograph could reflect summation artifact versus a ventral hernia. Multilevel degenerative changes of the thoracic spine. IMPRESSION: 1. No acute cardiopulmonary abnormality. Favored underlying large hiatal hernia 2. Lucency projecting anterior to the xiphoid process on lateral radiograph could reflect summation artifact versus a ventral hernia. Recommend correlation with physical exam. Electronically Signed   By: Corean Salter M.D.   On: 12/28/2023 10:43     Procedures   Medications Ordered in the ED  potassium chloride  10 mEq in 100 mL IVPB (10 mEq Intravenous New Bag/Given 12/28/23 1157)  pantoprazole  (PROTONIX ) injection 40 mg (40 mg Intravenous Given 12/28/23 1219)  dextrose  5 % and 0.45 % NaCl with KCl 40 mEq/L infusion (has no administration in time range)  sodium chloride  0.9 % bolus 1,000 mL (1,000 mLs Intravenous Bolus from Bag 12/28/23 1156)  prochlorperazine  (COMPAZINE ) injection 10 mg (10 mg Intravenous Given 12/28/23 1159)  diphenhydrAMINE  (BENADRYL ) injection 12.5 mg (12.5 mg Intravenous Given 12/28/23 1157)  HYDROmorphone  (DILAUDID ) injection 1 mg (1 mg Intravenous Given 12/28/23 1204)    Clinical Course as of 12/28/23 1236  Sun Dec 28, 2023  1204 Per Dr. Cindie, recommended admission for observation, twice daily PPI and would be available for consult in the morning. [JR]    Clinical Course User Index [JR] Lang Norleen POUR, PA-C                                 Medical Decision Making Amount and/or Complexity of Data Reviewed Labs: ordered. Radiology: ordered.  Risk Prescription drug management. Decision regarding hospitalization.   Initial Impression and Ddx 63 year old well-appearing female presenting for hematemesis and abdominal pain.  Exam notable for epigastric tenderness.  DDx includes perforated gastric ulcer, acute pancreatitis, ACS, acute cholecystitis, symptomatic anemia, GI bleed, other. Patient PMH that increases complexity of ED encounter:  recent diagnosis of gastric ulcer, s/p Nissen fundoplication years ago, recent admission for nausea and vomiting, CKD, GERD, chronic pain, CHF, brain AVM, hypertension   Interpretation of Diagnostics - I independent reviewed and interpreted the labs as followed: Potassium 2.7, WBC 14.5, negative troponin  - I independently visualized the following imaging with scope of interpretation limited to determining acute life threatening  conditions related to emergency care: acute abdomen xrays, which revealed nonacute but findings somewhat concerning for enteritis  -I personally reviewed and interpreted EKG which revealed sinus rhythm  Patient Reassessment and Ultimate Disposition/Management On reassessment abdominal pain still present but improved.  Discussed patient with Dr. Cindie of GI who recommended AND twice daily PPI for now and would be available for consult in the morning.  Treated with IV potassium.  Admitted to hospital service with Dr. Raenelle.  Patient management  required discussion with the following services or consulting groups:  Hospitalist Service and Gastroenterology  Complexity of Problems Addressed Acute complicated illness or Injury  Additional Data Reviewed and Analyzed Further history obtained from: Further history from spouse/family member and Past medical history and medications listed in the EMR  Patient Encounter Risk Assessment Consideration of hospitalization      Final diagnoses:  Hypokalemia  Nausea vomiting and diarrhea    ED Discharge Orders     None          Lang Norleen POUR, PA-C 12/28/23 1236    Kammerer, Megan L, DO 12/31/23 [phone]

## 2023-12-29 ENCOUNTER — Inpatient Hospital Stay (HOSPITAL_COMMUNITY)

## 2023-12-29 DIAGNOSIS — Z882 Allergy status to sulfonamides status: Secondary | ICD-10-CM | POA: Diagnosis not present

## 2023-12-29 DIAGNOSIS — I10 Essential (primary) hypertension: Secondary | ICD-10-CM | POA: Diagnosis not present

## 2023-12-29 DIAGNOSIS — Z79891 Long term (current) use of opiate analgesic: Secondary | ICD-10-CM | POA: Diagnosis not present

## 2023-12-29 DIAGNOSIS — F1120 Opioid dependence, uncomplicated: Secondary | ICD-10-CM | POA: Diagnosis present

## 2023-12-29 DIAGNOSIS — I130 Hypertensive heart and chronic kidney disease with heart failure and stage 1 through stage 4 chronic kidney disease, or unspecified chronic kidney disease: Secondary | ICD-10-CM | POA: Diagnosis present

## 2023-12-29 DIAGNOSIS — K2210 Ulcer of esophagus without bleeding: Secondary | ICD-10-CM | POA: Diagnosis present

## 2023-12-29 DIAGNOSIS — F17210 Nicotine dependence, cigarettes, uncomplicated: Secondary | ICD-10-CM | POA: Diagnosis present

## 2023-12-29 DIAGNOSIS — R197 Diarrhea, unspecified: Secondary | ICD-10-CM | POA: Diagnosis not present

## 2023-12-29 DIAGNOSIS — N189 Chronic kidney disease, unspecified: Secondary | ICD-10-CM | POA: Diagnosis present

## 2023-12-29 DIAGNOSIS — G894 Chronic pain syndrome: Secondary | ICD-10-CM | POA: Diagnosis present

## 2023-12-29 DIAGNOSIS — K449 Diaphragmatic hernia without obstruction or gangrene: Secondary | ICD-10-CM | POA: Diagnosis present

## 2023-12-29 DIAGNOSIS — G40909 Epilepsy, unspecified, not intractable, without status epilepticus: Secondary | ICD-10-CM | POA: Diagnosis present

## 2023-12-29 DIAGNOSIS — I5033 Acute on chronic diastolic (congestive) heart failure: Secondary | ICD-10-CM | POA: Diagnosis not present

## 2023-12-29 DIAGNOSIS — N183 Chronic kidney disease, stage 3 unspecified: Secondary | ICD-10-CM | POA: Diagnosis not present

## 2023-12-29 DIAGNOSIS — E860 Dehydration: Secondary | ICD-10-CM | POA: Diagnosis present

## 2023-12-29 DIAGNOSIS — F32A Depression, unspecified: Secondary | ICD-10-CM | POA: Diagnosis present

## 2023-12-29 DIAGNOSIS — K921 Melena: Secondary | ICD-10-CM | POA: Diagnosis not present

## 2023-12-29 DIAGNOSIS — K635 Polyp of colon: Secondary | ICD-10-CM | POA: Diagnosis not present

## 2023-12-29 DIAGNOSIS — K254 Chronic or unspecified gastric ulcer with hemorrhage: Secondary | ICD-10-CM | POA: Diagnosis present

## 2023-12-29 DIAGNOSIS — R1013 Epigastric pain: Secondary | ICD-10-CM | POA: Diagnosis not present

## 2023-12-29 DIAGNOSIS — K529 Noninfective gastroenteritis and colitis, unspecified: Secondary | ICD-10-CM | POA: Diagnosis present

## 2023-12-29 DIAGNOSIS — R112 Nausea with vomiting, unspecified: Secondary | ICD-10-CM | POA: Diagnosis not present

## 2023-12-29 DIAGNOSIS — K5289 Other specified noninfective gastroenteritis and colitis: Secondary | ICD-10-CM | POA: Diagnosis not present

## 2023-12-29 DIAGNOSIS — Z885 Allergy status to narcotic agent status: Secondary | ICD-10-CM | POA: Diagnosis not present

## 2023-12-29 DIAGNOSIS — E876 Hypokalemia: Secondary | ICD-10-CM | POA: Diagnosis present

## 2023-12-29 DIAGNOSIS — K219 Gastro-esophageal reflux disease without esophagitis: Secondary | ICD-10-CM

## 2023-12-29 DIAGNOSIS — K279 Peptic ulcer, site unspecified, unspecified as acute or chronic, without hemorrhage or perforation: Secondary | ICD-10-CM | POA: Diagnosis not present

## 2023-12-29 DIAGNOSIS — R935 Abnormal findings on diagnostic imaging of other abdominal regions, including retroperitoneum: Secondary | ICD-10-CM | POA: Diagnosis not present

## 2023-12-29 DIAGNOSIS — Z881 Allergy status to other antibiotic agents status: Secondary | ICD-10-CM | POA: Diagnosis not present

## 2023-12-29 DIAGNOSIS — G8929 Other chronic pain: Secondary | ICD-10-CM

## 2023-12-29 DIAGNOSIS — E785 Hyperlipidemia, unspecified: Secondary | ICD-10-CM | POA: Diagnosis present

## 2023-12-29 DIAGNOSIS — M797 Fibromyalgia: Secondary | ICD-10-CM | POA: Diagnosis present

## 2023-12-29 DIAGNOSIS — G2581 Restless legs syndrome: Secondary | ICD-10-CM

## 2023-12-29 DIAGNOSIS — Z888 Allergy status to other drugs, medicaments and biological substances status: Secondary | ICD-10-CM | POA: Diagnosis not present

## 2023-12-29 DIAGNOSIS — K257 Chronic gastric ulcer without hemorrhage or perforation: Secondary | ICD-10-CM | POA: Diagnosis not present

## 2023-12-29 DIAGNOSIS — F411 Generalized anxiety disorder: Secondary | ICD-10-CM | POA: Diagnosis present

## 2023-12-29 DIAGNOSIS — D122 Benign neoplasm of ascending colon: Secondary | ICD-10-CM | POA: Diagnosis present

## 2023-12-29 LAB — BASIC METABOLIC PANEL WITH GFR
Anion gap: 11 (ref 5–15)
BUN: 8 mg/dL (ref 8–23)
CO2: 19 mmol/L — ABNORMAL LOW (ref 22–32)
Calcium: 8.4 mg/dL — ABNORMAL LOW (ref 8.9–10.3)
Chloride: 105 mmol/L (ref 98–111)
Creatinine, Ser: 0.56 mg/dL (ref 0.44–1.00)
GFR, Estimated: >60 mL/min (ref >60)
Glucose, Bld: 128 mg/dL — ABNORMAL HIGH (ref 70–99)
Potassium: 3.5 mmol/L (ref 3.5–5.1)
Sodium: 135 mmol/L (ref 135–145)

## 2023-12-29 LAB — CBC
HCT: 34.8 % — ABNORMAL LOW (ref 36.0–46.0)
Hemoglobin: 11.1 g/dL — ABNORMAL LOW (ref 12.0–15.0)
MCH: 26.9 pg (ref 26.0–34.0)
MCHC: 31.9 g/dL (ref 30.0–36.0)
MCV: 84.3 fL (ref 80.0–100.0)
Platelets: 472 K/uL — ABNORMAL HIGH (ref 150–400)
RBC: 4.13 MIL/uL (ref 3.87–5.11)
RDW: 14.7 % (ref 11.5–15.5)
WBC: 13.7 K/uL — ABNORMAL HIGH (ref 4.0–10.5)
nRBC: 0 % (ref 0.0–0.2)

## 2023-12-29 LAB — MAGNESIUM: Magnesium: 1.7 mg/dL (ref 1.7–2.4)

## 2023-12-29 LAB — PHOSPHORUS: Phosphorus: 2.4 mg/dL — ABNORMAL LOW (ref 2.5–4.6)

## 2023-12-29 MED ORDER — IOHEXOL 300 MG/ML  SOLN
100.0000 mL | Freq: Once | INTRAMUSCULAR | Status: AC | PRN
  Administered 2023-12-29: 100 mL via INTRAVENOUS

## 2023-12-29 MED ORDER — POTASSIUM PHOSPHATES 15 MMOLE/5ML IV SOLN
30.0000 mmol | Freq: Once | INTRAVENOUS | Status: AC
  Administered 2023-12-29: 30 mmol via INTRAVENOUS
  Filled 2023-12-29: qty 10

## 2023-12-29 MED ORDER — IOHEXOL 9 MG/ML PO SOLN
500.0000 mL | ORAL | Status: AC
  Administered 2023-12-29: 500 mL via ORAL

## 2023-12-29 MED ORDER — POLYETHYLENE GLYCOL 3350 17 G PO PACK
17.0000 g | PACK | Freq: Two times a day (BID) | ORAL | Status: DC
  Administered 2023-12-30: 17 g via ORAL
  Filled 2023-12-29 (×6): qty 1

## 2023-12-29 NOTE — Telephone Encounter (Signed)
Message sent to endo to cancel procedure. 

## 2023-12-29 NOTE — Plan of Care (Signed)

## 2023-12-29 NOTE — Consult Note (Signed)
 Gastroenterology Consult   Referring Provider: Zelda Salmon ED Primary Care Physician:  Patient, No Pcp Per Primary Gastroenterologist:  Dr. Eartha  Patient ID: Renee Gutierrez; [id number]; 12/14/1960   Admit date: 12/28/2023  LOS: 0 days   Date of Consultation: 12/29/2023  Reason for Consultation:  persistent abdominal pain, reported hematemesis  History of Present Illness   Renee Gutierrez is a 63 y.o. 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/p surgery in 2017 and subsequent repair in 2018 with chronic left facial pain with numerous ED visits/hospitalizations, chronic pain syndrome, prior nissen fundoplication (2011), HTN, HLD, IBS, GERD, ?Barrett's, fibromyalgia, anxiety, history of intractable N/V, paraesophageal hernia with UGI bleed due to large ulcer in cardia located in the hernia sac requiring endoscopic management in Oct 2025 but not accepted by [facility] for repair and has been referred to [facility], presenting to the ED yesterday with persistent abdominal pain, reported hematemesis one occurrence, and now GI consulted for further management. She was recently inpatient 11/4-11/5 for intractable nausea and vomiting.   Prior imaging on 11/4 admission  with moderate hiatal hernia containing fundoplication, distal esophageal wall thickening, adjacent free fluid in the posterior mediastinum, consider CT chest.   This presentation:  Labs: WBC count 14.5 on admisison, down to 13.7, Hgb 10.8 yesterday, 11.1 today. At baseline of 10/11 range. Potassium 2.7 on admission, improved to 3.5 s/p repletion. Troponin negative. Lipase normal.   AAS yesterday with nonobstructive bowel gas pattern, mildly edematous appearance of the walls of loops of small bowel nonspecific, mild colonic stool burden diffusely.    Today:  Notes Epigastric/chest area pain, same as previous admission. Not worsened but not improved. Constant.  Nothing improves. When eating, this worsens. Nausea, intermittent vomiting. Last nausea on Saturday. No chills. States she keeps low grade fever at home of 99.9. +flatus. Looser stool yesterday, 3 of them. Stringy. No bowel movements today. States baseline is constipated. Denies feeling abdomen is distended. As it hurts to eat, sometimes she just avoids this. Denies shortness of breath but feels like she is smothering at times. BID PPI. No hematemesis. believes black stool Saturday night but none since then.   Chronic constipation: will take mag citrate or correctol prn. Linzess without any improvement. No prior Amitiza she is aware.    EGD 12/05/2023: 3 cm paraesophageal hernia, prior nissen fundoplication at GE junction, non-bleeding gastric ulcer in herniated sac with a nonbleeding visible vessel s/p bipolar cautery, normal stomach s/p biopsy, normal duodenum. Recommended referral for hiatal hernia repair at [facility].     EGD 11/28/2023: -LA Grade D erosive esophagitis with no bleeding -Non-bleeding gastric ulcer (20mm in largest dimension) with a flat pigmented spot (Forrest Class IIc). Injected. Ulcer bed coated with PuraStat. -Gastritis with diffuse erosions, erythema and friability entire stomach. Biopsied. Mild nonspecific reactive gastropathy. Neg for H.pylori -medium sized hiatal hernia -Normal duodenal bulb, first portion of duodenum and second portion of duodenum. -recommended PPI BID, carafate QID -recommended No NSAIDs -plan for EGD 8 weeks.  Last EGD/colonoscopy [facility] 2013: Op notes not available/path as outlined Duodenal bx: patchy gastric foveolar metaplasia and mild non-specific reactive/reparative changes Stomach bx: gastric antral and body mucosa with no path dx, no gastrisit. No h.pylori.  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    Past Surgical History:  Procedure Laterality Date   ABDOMINAL HYSTERECTOMY     ABDOMINAL SURGERY     tumor removed   APPENDECTOMY     BRAIN SURGERY     to fix AVM   CEREBRAL ANEURYSM REPAIR     CERVICAL FUSION     CESAREAN SECTION     CHOLECYSTECTOMY     ESOPHAGEAL DILATION N/A 11/28/2023   Procedure: DILATION, ESOPHAGUS;  Surgeon: Cindie Carlin POUR, DO;  Location: [facility];  Service: Endoscopy;  Laterality: N/A;   ESOPHAGOGASTRODUODENOSCOPY N/A 11/28/2023   Procedure: EGD (ESOPHAGOGASTRODUODENOSCOPY);  Surgeon: Cindie Carlin POUR, DO;  Location: [facility];  Service: Endoscopy;  Laterality: N/A;   ESOPHAGOGASTRODUODENOSCOPY N/A 12/05/2023   Procedure: EGD (ESOPHAGOGASTRODUODENOSCOPY);  Surgeon: Eartha Flavors, Toribio, MD;  Location: [facility];  Service: Gastroenterology;  Laterality: N/A;   LUNG SURGERY     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    Prior to Admission medications   Medication Sig Start Date End Date Taking? Authorizing Provider  amLODipine  (NORVASC ) 10 MG tablet Take 1 tablet (10 mg total) by mouth daily. 12/15/23   Arrien, Mauricio Daniel, MD  DULoxetine  (CYMBALTA ) 60 MG capsule Take 60 mg by mouth 2 (two) times daily. 05/16/20   [provider]  erythromycin  ophthalmic ointment Place 1 Application into the left eye 3 (three) times daily. 12/14/23   Arrien, Mauricio Daniel, MD  labetalol  (NORMODYNE ) 200 MG tablet Take 1 tablet (200 mg total) by mouth 2 (two) times daily. 12/14/23   Arrien, Elidia Toribio, MD  lisinopril  (ZESTRIL ) 10 MG tablet Take 1 tablet (10 mg total) by mouth daily.  12/14/23   Arrien, Mauricio Daniel, MD  morphine  (MS CONTIN ) 15 MG 12 hr tablet Take 15 mg by mouth every 12 (twelve) hours.    [provider]  morphine  (MSIR) 15 MG tablet Take 15 mg by mouth every 4 (four) hours as needed for severe pain (pain score 7-10).    [provider]  ondansetron  (ZOFRAN -ODT) 8 MG disintegrating tablet Take 1 tablet (8 mg total) by mouth every 6 (six) hours as needed for nausea or vomiting. 11/29/23   Darci Pore, MD  pantoprazole  (PROTONIX ) 40 MG tablet Take 1 tablet (40 mg total) by mouth 2 (two) times daily. 11/29/23   Darci Pore, MD  phenytoin  (DILANTIN ) 100 MG ER capsule Take 100 mg by mouth. Patient taking differently: Take 100 mg by mouth 2 (two) times daily. 12/09/23   [provider]  prednisoLONE  acetate (PRED FORTE ) 1 % ophthalmic suspension Place 1 drop into the left eye in the morning and at bedtime. 12/14/23   Arrien, Mauricio Daniel, MD  pregabalin  (LYRICA ) 50 MG capsule Take 50 mg by mouth 3 (three) times daily. 12/08/23   [provider]  prochlorperazine  (COMPAZINE ) 10 MG tablet Take 1 tablet (10 mg total) by mouth every 6 (six) hours as needed for nausea or vomiting. 12/24/23   Bryn Bernardino NOVAK, MD  rOPINIRole  (REQUIP ) 2 MG tablet Take 4 mg  by mouth See admin instructions. Take 1 tablet in the morning and 2 tablet every evening. 11/11/14   [provider]  simethicone (GAS RELIEF EXTRA STRENGTH) 125 MG chewable tablet Chew 125 mg by mouth daily as needed for flatulence.  05/24/14   [provider]  spironolactone (ALDACTONE) 25 MG tablet Take 1 tablet (25 mg total) by mouth daily. 12/14/23   Arrien, Mauricio Daniel, MD  sucralfate (CARAFATE) 1 GM/10ML suspension Take 10 mLs (1 g total) by mouth 4 (four) times daily -  with meals and at bedtime. 12/07/23   Evonnie Lenis, MD    Current Facility-Administered Medications  Medication Dose Route Frequency Provider Last Rate Last Admin    acetaminophen  (TYLENOL ) tablet 650 mg  650 mg Oral Q6H PRN Raenelle Coria, MD       Or   acetaminophen  (TYLENOL ) suppository 650 mg  650 mg Rectal Q6H PRN Ghimire, Kuber, MD       amLODipine  (NORVASC ) tablet 10 mg  10 mg Oral Daily Ghimire, Kuber, MD   10 mg at 12/29/23 0805   dextrose  5 % and 0.45 % NaCl with KCl 40 mEq/L infusion   Intravenous Continuous Ghimire, Kuber, MD 100 mL/hr at 12/29/23 0208 New Bag at 12/29/23 0208   DULoxetine  (CYMBALTA ) DR capsule 60 mg  60 mg Oral BID Ghimire, Kuber, MD   60 mg at 12/29/23 0805   enoxaparin  (LOVENOX ) injection 30 mg  30 mg Subcutaneous Q24H Ghimire, Kuber, MD   30 mg at 12/28/23 2120   fentaNYL  (SUBLIMAZE ) injection 25 mcg  25 mcg Intravenous Q2H PRN Raenelle Coria, MD       HYDROcodone -acetaminophen  (NORCO/VICODIN) 5-325 MG per tablet 1-2 tablet  1-2 tablet Oral Q4H PRN Ghimire, Kuber, MD   2 tablet at 12/29/23 0809   labetalol  (NORMODYNE ) tablet 200 mg  200 mg Oral BID Ghimire, Kuber, MD   200 mg at 12/29/23 0805   lisinopril  (ZESTRIL ) tablet 10 mg  10 mg Oral Daily Ghimire, Kuber, MD   10 mg at 12/29/23 0805   pantoprazole  (PROTONIX ) injection 40 mg  40 mg Intravenous Q12H Robinson, John K, PA-C   40 mg at 12/29/23 0805   phenytoin  (DILANTIN ) ER capsule 100 mg  100 mg Oral BID Ghimire, Kuber, MD   100 mg at 12/29/23 0805   potassium PHOSPHATE 30 mmol in dextrose  5 % 500 mL infusion  30 mmol Intravenous Once Madera, Carlos, MD 85 mL/hr at 12/29/23 0928 30 mmol at 12/29/23 9071   prochlorperazine  (COMPAZINE ) injection 10 mg  10 mg Intravenous Q6H PRN Ghimire, Kuber, MD   10 mg at 12/29/23 0203   rOPINIRole  (REQUIP ) tablet 2 mg  2 mg Oral Daily Ghimire, Kuber, MD   2 mg at 12/29/23 0805   rOPINIRole  (REQUIP ) tablet 4 mg  4 mg Oral QHS Ghimire, Kuber, MD   4 mg at 12/28/23 2113   spironolactone (ALDACTONE) tablet 25 mg  25 mg Oral Daily Ghimire, Kuber, MD   25 mg at 12/29/23 0805   sucralfate (CARAFATE) 1 GM/10ML suspension 1 g  1 g Oral TID WC & HS  Ghimire, Kuber, MD   1 g at 12/29/23 0804    Allergies as of 12/28/2023 - Review Complete 12/28/2023  Allergen Reaction Noted   Buprenorphine hcl Swelling 12/11/2016   Bupropion Swelling and Other (See Comments) 04/29/2017   Codeine Itching, Nausea And Vomiting, Nausea Only, and Swelling 12/03/2014   Sulfamethoxazole-trimethoprim Swelling and Other (See Comments) 04/05/2022   Nsaids Other (See  Comments) 05/30/2020   Pregabalin  Swelling 12/06/2009   Sulfa antibiotics Other (See Comments) 05/30/2020   Tapentadol Nausea And Vomiting 04/17/2023   Clindamycin Other (See Comments) 08/25/2015   Clindamycin/lincomycin Rash and Other (See Comments) 08/25/2015   Cymbalta  [duloxetine  hcl] Swelling 08/31/2011   Diclofenac Swelling 10/09/2010   Diclofenac sodium Swelling 10/09/2010   Gabapentin Swelling 12/06/2009   Oxycodone  Nausea Only 08/07/2021   Toradol  [ketorolac  tromethamine ] Rash 05/18/2016    Family History  Problem Relation Age of Onset   Throat cancer Mother    Bladder Cancer Father    Liver cancer Father    Colon cancer Neg Hx     Social History   Socioeconomic History   Marital status: Married    Spouse name: Not on file   Number of children: Not on file   Years of education: Not on file   Highest education level: Not on file  Occupational History   Not on file  Tobacco Use   Smoking status: Every Day    Types: Cigarettes   Smokeless tobacco: Never   Tobacco comments:    1 cigarette daily   Vaping Use   Vaping status: Never Used  Substance and Sexual Activity   Alcohol use: No   Drug use: No   Sexual activity: Yes    Birth control/protection: Surgical  Other Topics Concern   Not on file  Social History Narrative   Not on file   Social Drivers of Health   Financial Resource Strain: Not on file  Food Insecurity: Food Insecurity Present (12/28/2023)   Hunger Vital Sign    Worried About Running Out of Food in the Last Year: Sometimes true    Ran Out of Food  in the Last Year: Sometimes true  Transportation Needs: No Transportation Needs (12/28/2023)   PRAPARE - Administrator, Civil Service (Medical): No    Lack of Transportation (Non-Medical): No  Physical Activity: Not on file  Stress: Not on file  Social Connections: Not on file  Intimate Partner Violence: Not At Risk (12/28/2023)   Humiliation, Afraid, Rape, and Kick questionnaire    Fear of Current or Ex-Partner: No    Emotionally Abused: No    Physically Abused: No    Sexually Abused: No     Review of Systems   See HPI  Physical Exam   Vital Signs in last 24 hours: Temp:  [98.4 F (36.9 C)-99.7 F (37.6 C)] 98.4 F (36.9 C) (11/10 0320) Pulse Rate:  [78-91] 81 (11/10 0320) Resp:  [17-26] 18 (11/10 0320) BP: (147-195)/(78-98) 152/83 (11/10 0320) SpO2:  [97 %-100 %] 100 % (11/10 0320) Weight:  [43.4 kg] 43.4 kg (11/09 1433) Last BM Date : 12/28/23  General:   Alert,  Well-developed, well-nourished, pleasant and cooperative in NAD Head:  Normocephalic and atraumatic. Eyes:  Sclera clear, no icterus.   Ears:  Normal auditory acuity. Lungs:  Clear throughout to auscultation.    Heart:  S1 S2 present Abdomen:  Soft, TTP epigastric  and nondistended. No masses, hepatosplenomegaly or hernias noted. Normal bowel sounds, without guarding, and without rebound.   Rectal: deferred   Neurologic:  Alert and  oriented x4. Skin:  Intact without significant lesions or rashes. Psych:  Alert and cooperative. Normal mood and affect.  Intake/Output from previous day: No intake/output data recorded. Intake/Output this shift: No intake/output data recorded.    Labs/Studies   Recent Labs Recent Labs    12/28/23 1031 12/29/23 0459  WBC 14.5*  13.7*  HGB 10.8* 11.1*  HCT 34.0* 34.8*  PLT 481* 472*   BMET Recent Labs    12/28/23 1031 12/29/23 0459  NA 141 135  K 2.7* 3.5  CL 107 105  CO2 21* 19*  GLUCOSE 86 128*  BUN 12 8  CREATININE 0.66 0.56  CALCIUM  9.1  8.4*   LFT Recent Labs    12/28/23 1031  PROT 6.3*  ALBUMIN 3.5  AST 11*  ALT <5  ALKPHOS 78  BILITOT <0.2   PT/INR Recent Labs    12/28/23 1247  LABPROT 12.1  INR 0.9     Radiology/Studies DG Abd 2 Views Result Date: 12/28/2023 CLINICAL DATA:  [id number] Pain [id number] EXAM: ABDOMEN - 2 VIEW COMPARISON:  Correct over sixteenth 2025, December 01, 2023. FINDINGS: Air and stool-filled nondilated loops of bowel. Mildly edematous appearance of the walls of loops of small bowel. Mild colonic stool burden diffusely throughout the colon. Visualized lung bases are unremarkable. Favored hiatal hernia. Degenerative changes of the lumbar spine. Status post cholecystectomy. Levoscoliosis of the lumbar spine IMPRESSION: 1. Nonobstructive bowel gas pattern. 2. Mildly edematous appearance of the walls of loops of small bowel. This is nonspecific but could reflect enteritis. Electronically Signed   By: Corean Salter M.D.   On: 12/28/2023 10:46   DG Chest 2 View Result Date: 12/28/2023 CLINICAL DATA:  Chest pain EXAM: DG CHEST 2V COMPARISON:  December 04, 2023 FINDINGS: The cardiomediastinal silhouette is unchanged in contour.Rounded density over the midline likely reflecting a large hiatal hernia. Atherosclerotic calcifications of the aorta. No pleural effusion. No pneumothorax. No acute pleuroparenchymal abnormality. Lucency projecting anterior to the the xiphoid process on lateral radiograph could reflect summation artifact versus a ventral hernia. Multilevel degenerative changes of the thoracic spine. IMPRESSION: 1. No acute cardiopulmonary abnormality. Favored underlying large hiatal hernia 2. Lucency projecting anterior to the xiphoid process on lateral radiograph could reflect summation artifact versus a ventral hernia. Recommend correlation with physical exam. Electronically Signed   By: Corean Salter M.D.   On: 12/28/2023 10:43     Assessment   Renee Gutierrez is a 63 y.o. 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/p surgery in 2017 and subsequent repair in 2018 with chronic left facial pain with numerous ED visits/hospitalizations, chronic pain syndrome, prior nissen fundoplication (2011), HTN, HLD, IBS, GERD, ?Barrett's in past, fibromyalgia, anxiety, history of intractable N/V, paraesophageal hernia with UGI bleed due to large ulcer in cardia located in the hernia sac requiring endoscopic management in Oct 2025, referred to [facility] as outpatient for repair, presenting again this admission with persistent abdominal/chest pain, N/V, reported hematemesis, and GI consulted for further management.   Epigastric/chest pain: known paraesophageal hernia, worsened discomfort with eating, does not appear cardiac related. Prior imaging last week on CT with moderate hiatal hernia containing fundoplication, distal esophageal wall thickening, adjacent free fluid in the posterior mediastinum, consider CT chest. As she has persistent pain, N/V, will order CT chest/abd/pelvis today. Doubt an acute surgical issue at this time.   Hematemesis: reported prior to admission, no further vomiting since Saturday, no melena. Hgb holding steady at her baseline. Known large ulcer in cardia located in hernia sac and  did have a nonbleeding visible vessel s/p bipolar cautery. If overt bleeding, drop in Hgb, needs EGD.   Chronic constipation: could also be contributing to chronic pain presentation, no improvement with Linzess in the past. Consider Amitiza as outpatient. Start Miralax  BID now. Outpatient colonoscopy on hold until clinically improved and stabilized.      Plan / Recommendations    Continue IV BID PPI Continue Carafate Add Miralax BID CT chest/abd/pelvis Keep referral to [facility] If imaging unrevealing, advance to soft diet and needs strict behavior modifications with sitting upright, no laying down for at least 2-3 hours postprandially,  frequent smaller meals Holding off on outpatient colonoscopy which was already planned due to persistent issues. Can revisit this later as outpatient     12/29/2023, 9:49 AM  Therisa MICAEL Stager, PhD, ANP-BC [facility]

## 2023-12-29 NOTE — Care Management Obs Status (Signed)
 MEDICARE OBSERVATION STATUS NOTIFICATION   Patient Details  Name: Renee Gutierrez MRN: [id number] Date of Birth: [birth year]   Medicare Observation Status Notification Given:  Yes    Shailynn Fong L Crosby Oriordan 12/29/2023, 12:23 PM

## 2023-12-29 NOTE — Progress Notes (Signed)
 No PCP. LCSW added PCP list to AVS.    12/29/23 0803  TOC Brief Assessment  Insurance and Status Reviewed  Patient has primary care physician No (PCP list added to AVS.)  Home environment has been reviewed Lives with husband.  Prior level of function: Independent.  Prior/Current Home Services No current home services  Social Drivers of Health Review SDOH reviewed interventions complete  Readmission risk has been reviewed Yes  Transition of care needs no transition of care needs at this time

## 2023-12-29 NOTE — Telephone Encounter (Signed)
 Patient daughter [name] Null ( On DPR) called today, stating she does not want her mother to go to [facility] she would like for the patient to keep the appointment with [facility]. I checked with Jenkins Somerset the referral coordinator here and she says, no referral was placed by us . I advised the daughter to check the patient's My Chart to see who the appointment with [facility] it with and for her to reach out to them to make sure the patient still has an appointment. She states understanding.

## 2023-12-29 NOTE — Telephone Encounter (Signed)
 Called pt, was advised she is currently admitted to the hospital

## 2023-12-29 NOTE — Telephone Encounter (Signed)
 Per Crystal she spoke to daughter and patient wants to keep apt at [facility] and cancel [facility] referral

## 2023-12-29 NOTE — Progress Notes (Signed)
 Progress Note   Patient: Renee Gutierrez FMW:[id number] DOB: 11-23-1960 DOA: 12/28/2023     0 DOS: the patient was seen and examined on 12/29/2023   Brief hospital admission narrative: As per H&P written by Dr. Raenelle on 12/28/2023 Renee Gutierrez is a 63 y.o. female with medical history significant of GERD, IBS, left facial pain, trigeminal neuralgia, cerebral AVM involving brainstem, hiatal hernia status post Nissen fundoplication 20 years ago, recurrent nausea vomiting and recent multiple hospitalizations, discharge 2 days ago with similar complaints comes back to the emergency room with sudden onset of nausea vomiting, chest pain and abdominal pain started today morning.  Admitted with similar issues in October twice, EGD found to have large ulcer distal to her esophagus.  She had bleeding from the ulcer.  Patient stated going home in good condition 2 days ago and able to tolerate diet, she ate some chicken finger strips last night and started similar intractable nausea, anterior chest wall pain, abdominal pain and multiple episode of vomiting and some streaks of blood with one of the vomiting.  Patient is on chronic pain management.  Stated compliance to medications does not know what triggers the events but this time it was 16.   In the emergency room, blood pressure is stable and elevated.  WC count 14.5, hemoglobin 12.8, potassium 2.7.  KUB without any complications.  Patient was given 1 L fluid bolus, 20 mEq potassium, 1 dose of Dilaudid  and Compazine .  Persistently symptomatic.  Admitted for treatment.  Assessment and plan 1-intractable nausea and vomiting -Patient with prior history of GERD, hiatal hernia-and Nissen fundoplication. -Some improvement in her symptoms after receiving fluid resuscitation, IV PPI and antiemetics - Per recommendation by GI service CT scan of the chest will be done - Okay to allow clear liquid diet with n.p.o. status after midnight in case endoscopic  evaluation will be needed. - Continue supportive care and as needed analgesic. - Continue Carafate.  2-dehydration/hypokalemia/hypophosphatemia - Continue to replete electrolytes as needed - Continue to maintain adequate hydration. - Follow trend - Continue telemetry.  3-hypertension - Continue current antihypertensive regimen - Follow vital signs.  4-chronic pain syndrome - Planning to resume home oral pain medication when fully able to tolerate by mouth - For now continue IV fentanyl  as needed.  5-restless leg syndrome - Continue Requip    6-history of seizure disorder - Continue extended release phenytoin  twice a day.  7-depression/anxiety - Continue Cymbalta  - No suicidal ideation or hallucination.  8-leukocytosis - Appears to be associated with stress demargination - Maintain adequate hydration and follow trend - Holding on any antibiotics at the moment.   Subjective:  Afebrile; reports ongoing intermittent midepigastric pain and nausea.  No further vomiting.  Good saturation on room air appreciated on exam.  Chronically ill in appearance.  No active seizure appreciated or reported.  Physical Exam: Vitals:   12/28/23 1749 12/28/23 2115 12/28/23 2208 12/29/23 0320  BP: (!) 147/91 (!) 147/91 (!) 164/93 (!) 152/83  Pulse: 81 81 78 81  Resp: 20  18 18   Temp: 98.9 F (37.2 C)  98.4 F (36.9 C) 98.4 F (36.9 C)  TempSrc: Oral  Oral Oral  SpO2: 97%  97% 100%  Weight:      Height:       General exam: Alert, awake, oriented x 3; in no major distress. Respiratory system: Clear to auscultation. Respiratory effort normal.  Good saturation on room air. Cardiovascular system: No rubs, no gallops, no JVD. Gastrointestinal system: Abdomen  is nondistended, soft and with positive bowel sounds.  Patient reporting tenderness to palpation midepigastric area. Central nervous system: Alert and oriented. No focal neurological deficits. Extremities: No cyanosis or clubbing. Skin:  No petechiae. Psychiatry: Judgement and insight appear normal.  Flat affect appreciated on exam.  Data Reviewed: Basic metabolic panel: Sodium 135, potassium 3.5, chloride 105, bicarb 19, BUN 8, creatinine 0.56 and GFR >60 CBC: WBC 13.7, hemoglobin 11.1 and platelet count 472K Magnesium :1.7 Phosphorus: 2.4  Family Communication: No family at bedside.  Disposition: Status is: Inpatient Remains inpatient appropriate because: IV therapy.  Time spent: 50 minutes  Author: Eric Nunnery, MD 12/29/2023 6:43 PM  For on call review [email or web address].

## 2023-12-29 NOTE — Telephone Encounter (Signed)
 Please advise reason for [facility] referral

## 2023-12-29 NOTE — TOC Initial Note (Signed)
 Transition of Care [facility]) - Initial/Assessment Note    Patient Details  Name: Renee Gutierrez MRN: [id number] Date of Birth: 1960-10-16  Transition of Care [facility]) CM/SW Contact:    Mcarthur Saddie Kim, LCSW Phone Number: 12/29/2023, 2:26 PM  Clinical Narrative:  Pt admitted for intractable nausea and vomiting. Assessment completed due to high risk readmission score. Pt has had several admissions recently. Pt lives with husband and is independent with ADLs. She reports she has a walker at home and no current home health services. CSW will continue to follow.                  Expected Discharge Plan: Home/Self Care Barriers to Discharge: Continued Medical Work up   Patient Goals and CMS Choice Patient states their goals for this hospitalization and ongoing recovery are:: return home   Choice offered to / list presented to : Patient [place] ownership interest in [facility].provided to::  (n/a)    Expected Discharge Plan and Services In-house Referral: Clinical Social Work     Living arrangements for the past 2 months: Single Family Home                                      Prior Living Arrangements/Services Living arrangements for the past 2 months: Single Family Home Lives with:: Spouse Patient language and need for interpreter reviewed:: Yes Do you feel safe going back to the place where you live?: Yes          Current home services: DME Criminal Activity/Legal Involvement Pertinent to Current Situation/Hospitalization: No - Comment as needed  Activities of Daily Living   ADL Screening (condition at time of admission) Independently performs ADLs?: Yes (appropriate for developmental age) Is the patient deaf or have difficulty hearing?: No Does the patient have difficulty seeing, even when wearing glasses/contacts?: No Does the patient have difficulty concentrating, remembering, or making decisions?: No  Permission Sought/Granted                   Emotional Assessment     Affect (typically observed): Appropriate Orientation: : Oriented to Self, Oriented to Place, Oriented to  Time, Oriented to Situation Alcohol / Substance Use: Not Applicable Psych Involvement: No (comment)  Admission diagnosis:  Hypokalemia [E87.6] Nausea vomiting and diarrhea [R11.2, R19.7] Intractable nausea and vomiting [R11.2] Patient Active Problem List   Diagnosis Date Noted   Intractable nausea and vomiting 12/23/2023   Iron deficiency anemia due to chronic blood loss 12/17/2023   Acute on chronic diastolic CHF (congestive heart failure) (HCC) 12/14/2023   Peptic ulcer disease 12/14/2023   Bilateral lower extremity edema 12/12/2023   Hypophosphatemia 12/12/2023   Abdominal pain 12/07/2023   GIB (gastrointestinal bleeding) 12/04/2023   Acute gastric ulcer with hemorrhage 11/28/2023   Acute post-hemorrhagic anemia 11/27/2023   Prolonged QT interval 11/26/2023   Esophageal hiatal hernia 11/26/2023   Essential hypertension 11/26/2023   Chronic anemia 11/02/2023   Trigeminal neuralgia 05/13/2023   Anxiety 05/30/2020   AVM (arteriovenous malformation) brain 05/30/2020   External otitis of left ear 05/30/2020   Hypokalemia 05/30/2020   Hyperlipidemia 05/30/2020   History of intussusception 05/30/2020   Migraines 05/30/2020   Restless leg syndrome 05/30/2020   CKD (chronic kidney disease), 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/2020   Intussusception (HCC) 05/30/2020   Medicare annual wellness visit, subsequent 05/30/2020   MRSA (methicillin resistant staph aureus) culture positive  05/30/2020   Rash 05/30/2020   Prediabetes 05/30/2020   Nausea & vomiting 05/30/2020   Chronic hip pain 05/30/2020   IBS (irritable bowel syndrome) 05/30/2020   Long-term current use of opiate analgesic 11/19/2018   ACTH elevation 12/12/2016   Trigeminal neuralgia pain 07/31/2016   Resistant hypertension 02/18/2016   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   Ganglion cyst of  dorsum of right wrist 09/13/2015   Mild intermittent asthma without complication 04/25/2015   Idiopathic peripheral neuropathy 11/22/2014   Adrenal adenoma, right 07/10/2014   Prolactinoma (HCC) 07/07/2014   Osteoarthritis of cervical spine 06/06/2014   Cubital tunnel syndrome 08/24/2013   Spinal stenosis of lumbar region with neurogenic claudication 08/03/2013   GAD (generalized anxiety disorder) 09/12/2011   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 [place], [place] - [address] [place] [address] Phone: [phone] Fax: [phone]     Social Drivers of Health (SDOH) Social History: SDOH Screenings   Food Insecurity: Food Insecurity Present (12/28/2023)  Housing: Low Risk  (12/28/2023)  Transportation Needs: No Transportation Needs (12/28/2023)  Utilities: Not At Risk (12/28/2023)  Tobacco Use: High Risk (12/28/2023)   SDOH Interventions: Food Insecurity Interventions: Inpatient TOC, Other (Comment) (Resources added to AVS.)   Readmission Risk Interventions    12/29/2023    2:24 PM 12/24/2023   10:17 AM 12/06/2023   11:26 AM  Readmission Risk Prevention Plan  Transportation Screening Complete Complete Complete  Medication Review Oceanographer) Complete Complete Complete  HRI or Home Care Consult Complete Complete Complete  SW Recovery Care/Counseling Consult Complete Complete Complete  Palliative Care Screening Not Applicable Not Applicable Not Applicable  Skilled Nursing Facility Not Applicable Not Applicable  Not Applicable

## 2023-12-29 NOTE — Discharge Instructions (Addendum)
  Providers Accepting New Patients in [place], [place]    [facility] [address]  [place], [place] [address] [phone] Accepts most insurances  [facility] [address] [place], [place] [address] [phone] Accepts most insurances  [facility] [address] [place], [place] [address]  [phone] Must meet requirements  [facility] [address] [place], [place] [address] [phone] Accepts most insurances  [facility] [address]  [place], [place] [address] [phone] Accepts most insurances  [facility] [address]   [place], [place]   [phone] Accepts most insurances  [facility])  [phone] [address]  [place], [place] [address] [phone] Accepts most insurances     [facility] [address]  [place], [place] [address] [phone] Accepts most insurances  [facility] Department [address] [place], [place] [address] [phone] option 1 Accepts Medicaid and [facility] [address]  [place], [place] [address] [phone] Accepts most insurances  Benita Outhouse, MD [address] [place], [place] [address] [phone] Accepts most insurances  [facility] at [facility] [address]  [place], [place] [address] [phone] Accepts most insurances  [facility] [phone] [address] [place], [place] [address] [phone] Accepts most insurances  [place], [place] [address], [address] [place], [place] [address] [phone]  Accepts most insurances   Food Agency Name: [facility] Address: [address] [place], [place] [address] Phone: [phone] Website: www.adtsrc.org Service(s) Offered: Meals on [employer] and Meals with Friends.  Home care, at home assisted  living, volunteer services, Center  for Active Retirement, transportation Agency Name: [facility] Address: Sites vary. Much call first. Food Pantry locations: [address] [place], [place] [address] [employer] Phone: [phone] Website: none Service(s) Offered: Museum/gallery curator, utility assistance if funds available Careers Adviser for all of [place], [employer], [employer], Office Manager and [employer] for [place] area only) Walk-in  current Id and current address verification required. WedThurs: 9:30-12:00 Agency Name: [facility] Address: [address] [place], [place] [address] Phone: [phone] or [phone] Website: none Service(s) Offered: Food assistance. Agency Name: [facility] Address: [address] [place], [place] [address] Phone: [phone] Website: birthdayfever.[email or web address] Service(s) Offered: Food assistance Monday Nights 5:00PM-5:30PM Agency Name: [employer] Address: [address], [place], [place] [address] Phone: [phone] or [phone] Website: none Service(s) Offered: Serves 1 hot meal a day at 11:00 am Monday-Sunday and 5 pm  on the second and fourth Sunday of each month Agency Name: [facility] of Health and Human/Social Services Address: [address], [place], [place] [address] Phone: [phone] Website: [email or web address]  Service(s) Offered: Sales Executive, WIC program. Agency Name: [facility] Address: [address] [place], [place] / [address] [place], [place]  June 10, 2022 7 Phone: [phone] [place] / [phone] [place] Website: [email or web address]  Service(s) Offered: Civil Service Fast Streamer, food pantry, soup kitchen ([place]) emergency financial  assistance, thrift stores, showers & hygiene products ([place]),  Christmas Assistance, spiritual help. Agency  Name: [facility] Address: [address] [place], [place] [address] Phone: [phone] Website: [email or web address] Service(s) Offered: Food pantry Tuesday, Wednesday and Thursday 9am-11:30am  (need appointment) and health clinic (9:00am-11:00am)

## 2023-12-30 DIAGNOSIS — K625 Hemorrhage of anus and rectum: Secondary | ICD-10-CM

## 2023-12-30 DIAGNOSIS — E785 Hyperlipidemia, unspecified: Secondary | ICD-10-CM | POA: Diagnosis not present

## 2023-12-30 DIAGNOSIS — R935 Abnormal findings on diagnostic imaging of other abdominal regions, including retroperitoneum: Secondary | ICD-10-CM | POA: Diagnosis not present

## 2023-12-30 DIAGNOSIS — K219 Gastro-esophageal reflux disease without esophagitis: Secondary | ICD-10-CM | POA: Diagnosis not present

## 2023-12-30 DIAGNOSIS — R1084 Generalized abdominal pain: Secondary | ICD-10-CM

## 2023-12-30 DIAGNOSIS — E876 Hypokalemia: Secondary | ICD-10-CM | POA: Diagnosis not present

## 2023-12-30 DIAGNOSIS — K279 Peptic ulcer, site unspecified, unspecified as acute or chronic, without hemorrhage or perforation: Secondary | ICD-10-CM

## 2023-12-30 DIAGNOSIS — R112 Nausea with vomiting, unspecified: Secondary | ICD-10-CM | POA: Diagnosis not present

## 2023-12-30 DIAGNOSIS — K5289 Other specified noninfective gastroenteritis and colitis: Secondary | ICD-10-CM

## 2023-12-30 MED ORDER — SODIUM CHLORIDE 0.9 % IV SOLN: INTRAVENOUS | Status: DC

## 2023-12-30 MED ORDER — PEG 3350-KCL-NA BICARB-NACL 420 G PO SOLR
4000.0000 mL | Freq: Once | ORAL | Status: AC
  Administered 2023-12-30: 4000 mL via ORAL

## 2023-12-30 NOTE — Plan of Care (Signed)

## 2023-12-30 NOTE — Progress Notes (Signed)
 Subjective: Had a loose stool last night and one again this morning. Some ongoing abdominal pain in epigastric region. She denies any current rectal bleeding but noted some BRB mixed in with stool Saturday night, had some black stools on Sunday prior to admission. She endorses nausea but no vomiting. She does endorse some bilateral later abdominal/flank pain that has been ongoing for some time though unclear how long. Defecation did not improve her abdominal pain. She reports history of constipation, takes mag citrate at home PRN though for the past month or so has had looser to watery stools.   Objective: Vital signs in last 24 hours: Temp:  [98.1 F (36.7 C)-98.3 F (36.8 C)] (P) 98.2 F (36.8 C) (11/11 0859) Pulse Rate:  [96-106] (P) 103 (11/11 0859) Resp:  [16-19] 19 (11/11 0859) BP: (163-175)/(92-113) (P) 194/112 (11/11 0859) SpO2:  [96 %] 96 % (11/11 0347) Last BM Date : 12/29/23 General:   Alert and oriented, pleasant Head:  Normocephalic and atraumatic. Eyes:  No icterus, sclera clear. Conjuctiva pink.  Mouth:  Without lesions, mucosa pink and moist.  Heart:  S1, S2 present, no murmurs noted.  Lungs: Clear to auscultation bilaterally, without wheezing, rales, or rhonchi.  Abdomen:  Bowel sounds present, soft, non-tender, non-distended. No HSM or hernias noted. No rebound or guarding. No masses appreciated  Neurologic:  Alert and  oriented x4;  grossly normal neurologically. Skin:  Warm and dry, intact without significant lesions.  Psych:  Alert and cooperative. Normal mood and affect.   Lab Results: Recent Labs    12/28/23 1031 12/29/23 0459  WBC 14.5* 13.7*  HGB 10.8* 11.1*  HCT 34.0* 34.8*  PLT 481* 472*   BMET Recent Labs    12/28/23 1031 12/29/23 0459  NA 141 135  K 2.7* 3.5  CL 107 105  CO2 21* 19*  GLUCOSE 86 128*  BUN 12 8  CREATININE 0.66 0.56  CALCIUM  9.1 8.4*   LFT Recent Labs    12/28/23 1031  PROT 6.3*  ALBUMIN 3.5  AST 11*  ALT <5   ALKPHOS 78  BILITOT <0.2   PT/INR Recent Labs    12/28/23 1247  LABPROT 12.1  INR 0.9    Studies/Results: CT CHEST ABDOMEN PELVIS W CONTRAST Result Date: 12/29/2023 CLINICAL DATA:  Unintended weight loss, paraesophageal hernia, persistent chest and abdominal pain * Tracking Code: BO * EXAM: CT CHEST, ABDOMEN, AND PELVIS WITH CONTRAST TECHNIQUE: Multidetector CT imaging of the chest, abdomen and pelvis was performed following the standard protocol during bolus administration of intravenous contrast. RADIATION DOSE REDUCTION: This exam was performed according to the departmental dose-optimization program which includes automated exposure control, adjustment of the mA and/or kV according to patient size and/or use of iterative reconstruction technique. CONTRAST:  OMNIPAQUE  IOHEXOL  300 MG/ML  SOLN COMPARISON:  CT abdomen pelvis, 12/23/2023, CT chest angiogram, 11/25/2023 FINDINGS: CT CHEST FINDINGS Cardiovascular: Aortic atherosclerosis. Normal heart size. Unchanged small pericardial effusion. Mediastinum/Nodes: No enlarged mediastinal, hilar, or axillary lymph nodes. Large hiatal hernia with intrathoracic position of the gastric fundus; appearance suggests prior fundoplication. Thyroid gland, trachea, and esophagus demonstrate no significant findings. Lungs/Pleura: Moderate left, small right pleural effusions and associated atelectasis or consolidation. Mild centrilobular emphysema. Musculoskeletal: No chest wall abnormality. No acute osseous findings. CT ABDOMEN PELVIS FINDINGS Hepatobiliary: No focal liver abnormality is seen. Status post cholecystectomy. Postoperative biliary ductal dilatation. Pancreas: Unremarkable. No pancreatic ductal dilatation or surrounding inflammatory changes. Spleen: Normal in size without significant abnormality. Adrenals/Urinary Tract: Adrenal glands  are unremarkable. Kidneys are normal, without renal calculi, solid lesion, or hydronephrosis. Bladder is  unremarkable. Stomach/Bowel: Stomach is within normal limits. Appendix not clearly visualized. Long segment colonic wall thickening and mucosal hyperenhancement involving multiple segments of colon from the cecum to the rectum, with multiple interposed segments of sparing. Vascular/Lymphatic: Aortic atherosclerosis. No enlarged abdominal or pelvic lymph nodes. Reproductive: Hysterectomy. Other: No abdominal wall hernia or abnormality. No ascites. Musculoskeletal: No acute osseous findings. IMPRESSION: 1. Long segment colonic wall thickening and mucosal hyperenhancement involving multiple segments of colon from the cecum to the rectum, with multiple interposed segments of sparing. Findings are consistent with nonspecific infectious, inflammatory, or ischemic colitis, appearance of skip lesions suggesting Crohn's colitis. 2. Moderate left, small right pleural effusions and associated atelectasis or consolidation. 3. Large hiatal hernia with intrathoracic position of the gastric fundus; appearance suggests prior fundoplication. 4. Emphysema. 5. Cholecystectomy. Aortic Atherosclerosis (ICD10-I70.0) and Emphysema (ICD10-J43.9). Electronically Signed   By: Marolyn JONETTA Jaksch M.D.   On: 12/29/2023 16:13   DG Abd 2 Views Result Date: 12/28/2023 CLINICAL DATA:  [id number] Pain [id number] EXAM: ABDOMEN - 2 VIEW COMPARISON:  Correct over sixteenth 2025, December 01, 2023. FINDINGS: Air and stool-filled nondilated loops of bowel. Mildly edematous appearance of the walls of loops of small bowel. Mild colonic stool burden diffusely throughout the colon. Visualized lung bases are unremarkable. Favored hiatal hernia. Degenerative changes of the lumbar spine. Status post cholecystectomy. Levoscoliosis of the lumbar spine IMPRESSION: 1. Nonobstructive bowel gas pattern. 2. Mildly edematous appearance of the walls of loops of small bowel. This is nonspecific but could reflect enteritis. Electronically Signed   By: Corean Salter M.D.   On:  12/28/2023 10:46   DG Chest 2 View Result Date: 12/28/2023 CLINICAL DATA:  Chest pain EXAM: DG CHEST 2V COMPARISON:  December 04, 2023 FINDINGS: The cardiomediastinal silhouette is unchanged in contour.Rounded density over the midline likely reflecting a large hiatal hernia. Atherosclerotic calcifications of the aorta. No pleural effusion. No pneumothorax. No acute pleuroparenchymal abnormality. Lucency projecting anterior to the the xiphoid process on lateral radiograph could reflect summation artifact versus a ventral hernia. Multilevel degenerative changes of the thoracic spine. IMPRESSION: 1. No acute cardiopulmonary abnormality. Favored underlying large hiatal hernia 2. Lucency projecting anterior to the xiphoid process on lateral radiograph could reflect summation artifact versus a ventral hernia. Recommend correlation with physical exam. Electronically Signed   By: Corean Salter M.D.   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 GI consulted for further evaluation.  Hematemesis/melena/rectal bleeding: -reported prior to admission, none since saturday -endorses black stools on Sunday, BRB in stools on Saturday night, none since admission  -hgb stable at 11.1 (10.8 yesterday) -known large ulcer in cardia located in hernia sac, non bleeding  visible vessel s/p bipolar cautery -will need to repeat EGD if overt bleeding or declining hemoglobin   Chronic constipation/epigastric/abdominal pain: -Known paraesophageal hernia, worsened discomfort with eating -Does  not appear cardiac related, trops negative -Imaging last week on CT with moderate hiatal hernia containing fundoplication, distal esophageal wall thickening, adjacent free fluid in posterior mediastinum  -CT chest/abdomen/pelvis performed yesterday: Large hiatal hernia with intrathoracic position of the gastric fundus; appearance suggests prior fundoplication. Long segment colonic wall thickening and mucosal hyperenhancement involving multiple segments of colon from the cecum to the rectum, with multiple interposed segments of sparing. Findings with nonspecific infectious, inflammatory, or ischemic colitis, appearance of skip lesions suggesting Crohn's colitis. -Abd xray on 11/9 with mildly edematous appearance of walls of small bowel, could reflect enteritis, mild colonic stool burden throughout colon -notably she had findings of rectosigmoid colonic wall thickening on CT A/P done 10/7 as well.  -endorses chronic constipation for many years -no improvement with linzess in the past -Started on miralax BID yesterday -last TCS in 2013 -decreased appetite and weight loss (approx 11 pounds over the past month) -worsening abdominal pain postprandially, about 10-15 minutes after eating with fecal urgency/looser stools for the past month or two.   Etiology may be multifactorial in setting of findings on CT yesterday. she does endorse looser to watery stools with fecal urgency for the past month or so. No recent antibiotics prior to onset. Low suspicion for infectious etiology.  Unclear if large hiatal hernia with recent findings on EGD of ulcer in cardia is contributing to her pain. Ultimately cannot rule out stricturing Crohn's disease with small bowel involvement as she had findings  concerning for enteritis on recent Abd xray.  Cannot rule out ischemic colitis in setting of rectal bleeding though no obvious signs of this on recent imaging. She was previously scheduled for outpatient colonoscopy in December which was canceled due to acute illness as above, however, given these findings, discussed with Dr. Cinderella, will proceed with colonoscopy tomorrow for further evaluation. Indications, risks and benefits of procedure discussed in detail with patient. Patient verbalized understanding and is in agreement to proceed with Colonoscopy tomorrow.    Plan: Continue IV PPI BID Keep referral to [facility] regarding Hiatal hernia Clear liquids today, NPO midnight Colonoscopy tomorrow Will need outpatient EGD for follow up of gastric ulcer around January     LOS: 1 day    12/30/2023, 8:59 AM   Geno Sydnor L. Henrique Parekh, MSN, APRN, AGNP-C Adult-Gerontology Nurse Practitioner [facility]

## 2023-12-30 NOTE — Progress Notes (Signed)
 Progress Note   Patient: Renee Gutierrez FMW:[id number] DOB: Jun 24, 1960 DOA: 12/28/2023     1 DOS: the patient was seen and examined on 12/30/2023   Brief hospital admission narrative: As per H&P written by Dr. Raenelle on 12/28/2023 Renee Gutierrez is a 63 y.o. female with medical history significant of GERD, IBS, left facial pain, trigeminal neuralgia, cerebral AVM involving brainstem, hiatal hernia status post Nissen fundoplication 20 years ago, recurrent nausea vomiting and recent multiple hospitalizations, discharge 2 days ago with similar complaints comes back to the emergency room with sudden onset of nausea vomiting, chest pain and abdominal pain started today morning.  Admitted with similar issues in October twice, EGD found to have large ulcer distal to her esophagus.  She had bleeding from the ulcer.  Patient stated going home in good condition 2 days ago and able to tolerate diet, she ate some chicken finger strips last night and started similar intractable nausea, anterior chest wall pain, abdominal pain and multiple episode of vomiting and some streaks of blood with one of the vomiting.  Patient is on chronic pain management.  Stated compliance to medications does not know what triggers the events but this time it was 16.   In the emergency room, blood pressure is stable and elevated.  WC count 14.5, hemoglobin 12.8, potassium 2.7.  KUB without any complications.  Patient was given 1 L fluid bolus, 20 mEq potassium, 1 dose of Dilaudid  and Compazine .  Persistently symptomatic.  Admitted for treatment.  Assessment and plan 1-intractable nausea and vomiting -Patient with prior history of GERD, hiatal hernia-and Nissen fundoplication. -Some improvement in her symptoms after receiving fluid resuscitation, IV PPI and antiemetics - CT scan demonstrating colonic wall thickening with a skip lesions and concern for IBD - Planning to continue clear liquid diet and n.p.o. after midnight with  intentions to pursued colonoscopy evaluation. - Okay to allow clear liquid diet with n.p.o. status after midnight in case endoscopic evaluation will be needed. - Continue supportive care and as needed analgesic. - Continue Carafate.  2-dehydration/hypokalemia/hypophosphatemia - Continue to replete electrolytes as needed - Continue to maintain adequate hydration. - Follow trend - Continue telemetry.  3-hypertension - Continue current antihypertensive regimen - Follow vital signs.  4-chronic pain syndrome - Planning to resume home oral pain medication when fully able to tolerate by mouth - For now continue IV fentanyl  as needed.  5-restless leg syndrome - Continue Requip    6-history of seizure disorder - Continue extended release phenytoin  twice a day.  7-depression/anxiety - Continue Cymbalta  - No suicidal ideation or hallucination.  8-leukocytosis - Appears to be associated with stress demargination - Maintain adequate hydration and follow trend - Continue holding on any antibiotics at the moment.   Subjective:  No fever, no chest pain, no nausea, no vomiting.  Patient reports an intermittent nausea but no further vomiting and expressed still ongoing intermittent abdominal discomfort.  CT scan demonstrating colonic wall thickening with skip lesions and concerns for IBD.  Physical Exam: Vitals:   12/30/23 0902 12/30/23 1200 12/30/23 1500 12/30/23 1548  BP: (!) 186/105 (!) 143/89  (!) 154/96  Pulse:  91  (!) 105  Resp:   19   Temp:    98.5 F (36.9 C)  TempSrc:    Oral  SpO2:    97%  Weight:      Height:       General exam: Alert, awake, oriented x 3; in no major distress.  Reporting intermittent nausea but  no further vomiting. Respiratory system: Clear to auscultation. Respiratory effort normal.  Good saturation on room air. Cardiovascular system:RRR. No murmurs, rubs, gallops. Gastrointestinal system: Abdomen is nondistended, soft and without guarding.  Positive  bowel sounds appreciated on exam.  Patient is still reporting 7 midepigastric and mid abdomen discomfort. Central nervous system: Alert and oriented. No focal neurological deficits. Extremities: No cyanosis or clubbing. Skin: No petechiae. Psychiatry: Judgement and insight appear normal.  Flat affect appreciated on exam.  Latest data Reviewed: Basic metabolic panel: Sodium 135, potassium 3.5, chloride 105, bicarb 19, BUN 8, creatinine 0.56 and GFR >60 CBC: WBC 13.7, hemoglobin 11.1 and platelet count 472K Magnesium :1.7 Phosphorus: 2.4  Family Communication: No family at bedside.  Disposition: Status is: Inpatient Remains inpatient appropriate because: IV therapy.  Time spent: 50 minutes  Author: Eric Nunnery, MD 12/30/2023 4:00 PM  For on call review [email or web address].

## 2023-12-30 NOTE — H&P (View-Only) (Signed)
 Subjective: Had a loose stool last night and one again this morning. Some ongoing abdominal pain in epigastric region. She denies any current rectal bleeding but noted some BRB mixed in with stool Saturday night, had some black stools on Sunday prior to admission. She endorses nausea but no vomiting. She does endorse some bilateral later abdominal/flank pain that has been ongoing for some time though unclear how long. Defecation did not improve her abdominal pain. She reports history of constipation, takes mag citrate at home PRN though for the past month or so has had looser to watery stools.   Objective: Vital signs in last 24 hours: Temp:  [98.1 F (36.7 C)-98.3 F (36.8 C)] (P) 98.2 F (36.8 C) (11/11 0859) Pulse Rate:  [96-106] (P) 103 (11/11 0859) Resp:  [16-19] 19 (11/11 0859) BP: (163-175)/(92-113) (P) 194/112 (11/11 0859) SpO2:  [96 %] 96 % (11/11 0347) Last BM Date : 12/29/23 General:   Alert and oriented, pleasant Head:  Normocephalic and atraumatic. Eyes:  No icterus, sclera clear. Conjuctiva pink.  Mouth:  Without lesions, mucosa pink and moist.  Heart:  S1, S2 present, no murmurs noted.  Lungs: Clear to auscultation bilaterally, without wheezing, rales, or rhonchi.  Abdomen:  Bowel sounds present, soft, non-tender, non-distended. No HSM or hernias noted. No rebound or guarding. No masses appreciated  Neurologic:  Alert and  oriented x4;  grossly normal neurologically. Skin:  Warm and dry, intact without significant lesions.  Psych:  Alert and cooperative. Normal mood and affect.   Lab Results: Recent Labs    12/28/23 1031 12/29/23 0459  WBC 14.5* 13.7*  HGB 10.8* 11.1*  HCT 34.0* 34.8*  PLT 481* 472*   BMET Recent Labs    12/28/23 1031 12/29/23 0459  NA 141 135  K 2.7* 3.5  CL 107 105  CO2 21* 19*  GLUCOSE 86 128*  BUN 12 8  CREATININE 0.66 0.56  CALCIUM  9.1 8.4*   LFT Recent Labs    12/28/23 1031  PROT 6.3*  ALBUMIN 3.5  AST 11*  ALT <5   ALKPHOS 78  BILITOT <0.2   PT/INR Recent Labs    12/28/23 1247  LABPROT 12.1  INR 0.9    Studies/Results: CT CHEST ABDOMEN PELVIS W CONTRAST Result Date: 12/29/2023 CLINICAL DATA:  Unintended weight loss, paraesophageal hernia, persistent chest and abdominal pain * Tracking Code: BO * EXAM: CT CHEST, ABDOMEN, AND PELVIS WITH CONTRAST TECHNIQUE: Multidetector CT imaging of the chest, abdomen and pelvis was performed following the standard protocol during bolus administration of intravenous contrast. RADIATION DOSE REDUCTION: This exam was performed according to the departmental dose-optimization program which includes automated exposure control, adjustment of the mA and/or kV according to patient size and/or use of iterative reconstruction technique. CONTRAST:  OMNIPAQUE  IOHEXOL  300 MG/ML  SOLN COMPARISON:  CT abdomen pelvis, 12/23/2023, CT chest angiogram, 11/25/2023 FINDINGS: CT CHEST FINDINGS Cardiovascular: Aortic atherosclerosis. Normal heart size. Unchanged small pericardial effusion. Mediastinum/Nodes: No enlarged mediastinal, hilar, or axillary lymph nodes. Large hiatal hernia with intrathoracic position of the gastric fundus; appearance suggests prior fundoplication. Thyroid gland, trachea, and esophagus demonstrate no significant findings. Lungs/Pleura: Moderate left, small right pleural effusions and associated atelectasis or consolidation. Mild centrilobular emphysema. Musculoskeletal: No chest wall abnormality. No acute osseous findings. CT ABDOMEN PELVIS FINDINGS Hepatobiliary: No focal liver abnormality is seen. Status post cholecystectomy. Postoperative biliary ductal dilatation. Pancreas: Unremarkable. No pancreatic ductal dilatation or surrounding inflammatory changes. Spleen: Normal in size without significant abnormality. Adrenals/Urinary Tract: Adrenal glands  are unremarkable. Kidneys are normal, without renal calculi, solid lesion, or hydronephrosis. Bladder is  unremarkable. Stomach/Bowel: Stomach is within normal limits. Appendix not clearly visualized. Long segment colonic wall thickening and mucosal hyperenhancement involving multiple segments of colon from the cecum to the rectum, with multiple interposed segments of sparing. Vascular/Lymphatic: Aortic atherosclerosis. No enlarged abdominal or pelvic lymph nodes. Reproductive: Hysterectomy. Other: No abdominal wall hernia or abnormality. No ascites. Musculoskeletal: No acute osseous findings. IMPRESSION: 1. Long segment colonic wall thickening and mucosal hyperenhancement involving multiple segments of colon from the cecum to the rectum, with multiple interposed segments of sparing. Findings are consistent with nonspecific infectious, inflammatory, or ischemic colitis, appearance of skip lesions suggesting Crohn's colitis. 2. Moderate left, small right pleural effusions and associated atelectasis or consolidation. 3. Large hiatal hernia with intrathoracic position of the gastric fundus; appearance suggests prior fundoplication. 4. Emphysema. 5. Cholecystectomy. Aortic Atherosclerosis (ICD10-I70.0) and Emphysema (ICD10-J43.9). Electronically Signed   By: Marolyn JONETTA Jaksch M.D.   On: 12/29/2023 16:13   DG Abd 2 Views Result Date: 12/28/2023 CLINICAL DATA:  855384 Pain [id number] EXAM: ABDOMEN - 2 VIEW COMPARISON:  Correct over sixteenth 2025, December 01, 2023. FINDINGS: Air and stool-filled nondilated loops of bowel. Mildly edematous appearance of the walls of loops of small bowel. Mild colonic stool burden diffusely throughout the colon. Visualized lung bases are unremarkable. Favored hiatal hernia. Degenerative changes of the lumbar spine. Status post cholecystectomy. Levoscoliosis of the lumbar spine IMPRESSION: 1. Nonobstructive bowel gas pattern. 2. Mildly edematous appearance of the walls of loops of small bowel. This is nonspecific but could reflect enteritis. Electronically Signed   By: Corean Salter M.D.   On:  12/28/2023 10:46   DG Chest 2 View Result Date: 12/28/2023 CLINICAL DATA:  Chest pain EXAM: DG CHEST 2V COMPARISON:  December 04, 2023 FINDINGS: The cardiomediastinal silhouette is unchanged in contour.Rounded density over the midline likely reflecting a large hiatal hernia. Atherosclerotic calcifications of the aorta. No pleural effusion. No pneumothorax. No acute pleuroparenchymal abnormality. Lucency projecting anterior to the the xiphoid process on lateral radiograph could reflect summation artifact versus a ventral hernia. Multilevel degenerative changes of the thoracic spine. IMPRESSION: 1. No acute cardiopulmonary abnormality. Favored underlying large hiatal hernia 2. Lucency projecting anterior to the xiphoid process on lateral radiograph could reflect summation artifact versus a ventral hernia. Recommend correlation with physical exam. Electronically Signed   By: Corean Salter M.D.   On: 12/28/2023 10:43    Assessment: Renee Gutierrez is a 63 year old female with 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/p surgery in 2017 and subsequent repair in 2018 with chronic left facial pain with numerous ED visits/hospitalizations, chronic pain syndrome, prior nissen fundoplication (2011), HTN, HLD, IBS, GERD, ?Barrett's in past, fibromyalgia, anxiety, history of intractable N/V, paraesophageal hernia with UGI bleed due to large ulcer in cardia located in the hernia sac requiring endoscopic management in Oct 2025, referred to [facility] as outpatient for repair, presenting again this admission with persistent abdominal/chest pain, N/V, reported hematemesis. GI consulted for further evaluation.  Hematemesis/melena/rectal bleeding: -reported prior to admission, none since saturday -endorses black stools on Sunday, BRB in stools on Saturday night, none since admission  -hgb stable at 11.1 (10.8 yesterday) -known large ulcer in cardia located in hernia sac, non bleeding  visible vessel s/p bipolar cautery -will need to repeat EGD if overt bleeding or declining hemoglobin   Chronic constipation/epigastric/abdominal pain: -Known paraesophageal hernia, worsened discomfort with eating -Does  not appear cardiac related, trops negative -Imaging last week on CT with moderate hiatal hernia containing fundoplication, distal esophageal wall thickening, adjacent free fluid in posterior mediastinum  -CT chest/abdomen/pelvis performed yesterday: Large hiatal hernia with intrathoracic position of the gastric fundus; appearance suggests prior fundoplication. Long segment colonic wall thickening and mucosal hyperenhancement involving multiple segments of colon from the cecum to the rectum, with multiple interposed segments of sparing. Findings with nonspecific infectious, inflammatory, or ischemic colitis, appearance of skip lesions suggesting Crohn's colitis. -Abd xray on 11/9 with mildly edematous appearance of walls of small bowel, could reflect enteritis, mild colonic stool burden throughout colon -notably she had findings of rectosigmoid colonic wall thickening on CT A/P done 10/7 as well.  -endorses chronic constipation for many years -no improvement with linzess in the past -Started on miralax BID yesterday -last TCS in 2013 -decreased appetite and weight loss (approx 11 pounds over the past month) -worsening abdominal pain postprandially, about 10-15 minutes after eating with fecal urgency/looser stools for the past month or two.   Etiology may be multifactorial in setting of findings on CT yesterday. she does endorse looser to watery stools with fecal urgency for the past month or so. No recent antibiotics prior to onset. Low suspicion for infectious etiology.  Unclear if large hiatal hernia with recent findings on EGD of ulcer in cardia is contributing to her pain. Ultimately cannot rule out stricturing Crohn's disease with small bowel involvement as she had findings  concerning for enteritis on recent Abd xray.  Cannot rule out ischemic colitis in setting of rectal bleeding though no obvious signs of this on recent imaging. She was previously scheduled for outpatient colonoscopy in December which was canceled due to acute illness as above, however, given these findings, discussed with Dr. Cinderella, will proceed with colonoscopy tomorrow for further evaluation. Indications, risks and benefits of procedure discussed in detail with patient. Patient verbalized understanding and is in agreement to proceed with Colonoscopy tomorrow.    Plan: Continue IV PPI BID Keep referral to [facility] regarding Hiatal hernia Clear liquids today, NPO midnight Colonoscopy tomorrow Will need outpatient EGD for follow up of gastric ulcer around January     LOS: 1 day    12/30/2023, 8:59 AM   Jillane Po L. Dom Haverland, MSN, APRN, AGNP-C Adult-Gerontology Nurse Practitioner [facility]

## 2023-12-31 ENCOUNTER — Other Ambulatory Visit: Payer: Self-pay

## 2023-12-31 ENCOUNTER — Inpatient Hospital Stay (HOSPITAL_COMMUNITY): Admitting: Anesthesiology

## 2023-12-31 ENCOUNTER — Encounter (HOSPITAL_COMMUNITY)
Admission: EM | Disposition: A | Discharge status: Home / Self Care | Payer: Self-pay | Source: Home / Self Care | Attending: Internal Medicine

## 2023-12-31 ENCOUNTER — Encounter (HOSPITAL_COMMUNITY): Payer: Self-pay | Admitting: Internal Medicine

## 2023-12-31 DIAGNOSIS — K625 Hemorrhage of anus and rectum: Secondary | ICD-10-CM

## 2023-12-31 DIAGNOSIS — E876 Hypokalemia: Secondary | ICD-10-CM | POA: Diagnosis not present

## 2023-12-31 DIAGNOSIS — K921 Melena: Secondary | ICD-10-CM | POA: Diagnosis not present

## 2023-12-31 DIAGNOSIS — D122 Benign neoplasm of ascending colon: Secondary | ICD-10-CM | POA: Diagnosis not present

## 2023-12-31 DIAGNOSIS — I130 Hypertensive heart and chronic kidney disease with heart failure and stage 1 through stage 4 chronic kidney disease, or unspecified chronic kidney disease: Secondary | ICD-10-CM | POA: Diagnosis not present

## 2023-12-31 DIAGNOSIS — K635 Polyp of colon: Secondary | ICD-10-CM | POA: Diagnosis not present

## 2023-12-31 DIAGNOSIS — N183 Chronic kidney disease, stage 3 unspecified: Secondary | ICD-10-CM

## 2023-12-31 DIAGNOSIS — I10 Essential (primary) hypertension: Secondary | ICD-10-CM | POA: Diagnosis not present

## 2023-12-31 DIAGNOSIS — I5033 Acute on chronic diastolic (congestive) heart failure: Secondary | ICD-10-CM | POA: Diagnosis not present

## 2023-12-31 DIAGNOSIS — R112 Nausea with vomiting, unspecified: Secondary | ICD-10-CM | POA: Diagnosis not present

## 2023-12-31 HISTORY — PX: COLONOSCOPY: SHX5424

## 2023-12-31 SURGERY — COLONOSCOPY
Anesthesia: Monitor Anesthesia Care

## 2023-12-31 MED ORDER — LABETALOL HCL 5 MG/ML IV SOLN
10.0000 mg | Freq: Once | INTRAVENOUS | Status: DC
  Filled 2023-12-31: qty 4

## 2023-12-31 MED ORDER — LABETALOL HCL 200 MG PO TABS: 300.0000 mg | ORAL_TABLET | Freq: Two times a day (BID) | ORAL | Status: DC

## 2023-12-31 MED ORDER — LACTATED RINGERS IV SOLN: INTRAVENOUS | Status: DC | PRN

## 2023-12-31 MED ORDER — HYDRALAZINE HCL 20 MG/ML IJ SOLN
10.0000 mg | Freq: Four times a day (QID) | INTRAMUSCULAR | Status: DC | PRN
  Administered 2023-12-31: 10 mg via INTRAVENOUS
  Filled 2023-12-31: qty 1

## 2023-12-31 MED ORDER — PROPOFOL 10 MG/ML IV BOLUS
INTRAVENOUS | Status: DC | PRN
  Administered 2023-12-31: 150 ug/kg/min via INTRAVENOUS
  Administered 2023-12-31: 100 mg via INTRAVENOUS

## 2023-12-31 MED ORDER — LABETALOL HCL 200 MG PO TABS
300.0000 mg | ORAL_TABLET | Freq: Two times a day (BID) | ORAL | Status: DC
  Administered 2023-12-31 – 2024-01-01 (×2): 300 mg via ORAL
  Filled 2023-12-31 (×2): qty 1

## 2023-12-31 NOTE — Transfer of Care (Signed)
 Immediate Anesthesia Transfer of Care Note  Patient: Renee Gutierrez  Procedure(s) Performed: COLONOSCOPY  Patient Location: PACU  Anesthesia Type:General  Level of Consciousness: drowsy, patient cooperative, and responds to stimulation  Airway & Oxygen Therapy: Patient Spontanous Breathing  Post-op Assessment: Report given to RN and Post -op Vital signs reviewed and stable  Post vital signs: Reviewed and stable  Last Vitals:  Vitals Value Taken Time  BP 146/75 12/31/23 14:00  Temp 36.5 C 12/31/23 14:00  Pulse 89 12/31/23 14:01  Resp 17 12/31/23 14:01  SpO2 100 % 12/31/23 14:01  Vitals shown include unfiled device data.  Last Pain:  Vitals:   12/31/23 1320  TempSrc:   PainSc: 8       Patients Stated Pain Goal: 10 (12/30/23 0859)  Complications: No notable events documented.

## 2023-12-31 NOTE — Plan of Care (Signed)

## 2023-12-31 NOTE — Interval H&P Note (Signed)
 History and Physical Interval Note:  12/31/2023 1:06 PM  Renee Gutierrez  has presented today for surgery, with the diagnosis of abdominal pain, rectal bleeding, loose stools, findings of colitis with skip lesions on imaging, rule out IBD.  The various methods of treatment have been discussed with the patient and family. After consideration of risks, benefits and other options for treatment, the patient has consented to  Procedure(s): COLONOSCOPY (N/A) as a surgical intervention.  The patient's history has been reviewed, patient examined, no change in status, stable for surgery.  I have reviewed the patient's chart and labs.  Questions were answered to the patient's satisfaction.     Lamar Hollingshead   patient seen and examined in short stay.  White count 16 hemoglobin 11.8 abnormal colon on CAT scan.  Hematochezia.  Agree with need for diagnostic colonoscopy today per plan.  The risks, benefits, limitations, alternatives and imponderables have been reviewed with the patient. Questions have been answered. All parties are agreeable.

## 2023-12-31 NOTE — Op Note (Signed)
 [facility] Patient Name: Renee Gutierrez Procedure Date: 12/31/2023 12:19 PM MRN: [id number] Date of Birth: March 17, 1960 Attending MD: Lamar Ozell Hollingshead , MD, [id number] CSN: [id number] Age: 63 Admit Type: Inpatient Procedure:                Ilio?colonoscopy with snare polypectomy and biopsy Indications:              Hematochezia, Abnormal CT of the GI tract Providers:                Lamar Ozell Hollingshead, MD, Crystal Page, Chad                            Wilson, Technician Referring MD:              Medicines:                Propofol per Anesthesia Complications:            No immediate complications. Estimated Blood Loss:     Estimated blood loss was minimal. Procedure:                Pre-Anesthesia Assessment:                           - Prior to the procedure, a History and Physical                            was performed, and patient medications and                            allergies were reviewed. The patient's tolerance of                            previous anesthesia was also reviewed. The risks                            and benefits of the procedure and the sedation                            options and risks were discussed with the patient.                            All questions were answered, and informed consent                            was obtained. Prior Anticoagulants: The patient has                            taken no anticoagulant or antiplatelet agents. ASA                            Grade Assessment: III - A patient with severe                            systemic disease. After reviewing the risks and  benefits, the patient was deemed in satisfactory                            condition to undergo the procedure.                           After obtaining informed consent, the colonoscope                            was passed under direct vision. Throughout the                            procedure, the patient's blood  pressure, pulse, and                            oxygen saturations were monitored continuously. The                            [id number] ([id number]) Peds Colon was introduced                            through the anus and advanced to the 5 cm into the                            ileum. The colonoscopy was performed without                            difficulty. The patient tolerated the procedure                            well. The quality of the bowel preparation was                            adequate. The entire colon was well visualized. Scope In: 1:25:32 PM Scope Out: 1:55:59 PM Scope Withdrawal Time: 0 hours 14 minutes 22 seconds  Total Procedure Duration: 0 hours 30 minutes 27 seconds  Findings:      The perianal and digital rectal examinations were normal.      A 6 mm polyp was found in the ascending colon. The polyp was       semi-pedunculated. The polyp was removed with a cold snare. Resection       and retrieval were complete. Estimated blood loss was minimal.      Minimally fibrotic appearing colonic mucosa in the proximal ascending       segment to the ileocecal valve. However, the remainder of the colonic       mucosa appeared entirely normal. Ascending colon findings were subtle.       The terminal ileum was intubated and the distal 5 cm appeared normal.      Biopsies of the proximal ascending colon were taken for histologic study.      The exam was otherwise without abnormality on direct and retroflexion       views. Impression:               - One 6 mm polyp in the ascending colon, removed  with a cold snare. Resected and retrieved.                           - Minimally fibrotic appearing ascending                            segment?status post biopsy. Normal-appearing                            terminal ileum.                           -The examination was otherwise normal on direct and                            retroflexion views.                            - Patient has likely experienced a self-limiting                            colitis. Although a component of the CT scan                            findings could be artifactual, most likely she has                            experienced a self-limiting infectious colitis.                            Distribution of colon wall thickening on CT would                            be quite - -atypical for segmental or ischemic                            colitis involving the entire colon. No findings to                            suggest inflammatory bowel disease. Moderate Sedation:      Moderate (conscious) sedation was personally administered by an       anesthesia professional. The following parameters were monitored: oxygen       saturation, heart rate, blood pressure, respiratory rate, EKG, adequacy       of pulmonary ventilation, and response to care. Recommendation:           - Return patient to hospital ward for ongoing care.                           - Advance diet as tolerated.                           - Continue present medications.                           - Repeat colonoscopy  date to be determined after                            pending pathology results are reviewed for                            surveillance.                           - Continue PPI once daily. Follow-up with tertiary                            referral regarding of hiatal hernia.                           - Return to GI office (date not yet determined). At                            patient request, I called her husband Renee Gutierrez at                            [phone]. No answer. Procedure Code(s):        --- Professional ---                           [phone], Colonoscopy, flexible; with removal of                            tumor(s), polyp(s), or other lesion(s) by snare                            technique Diagnosis Code(s):        --- Professional ---                           D12.2,  Benign neoplasm of ascending colon                           K92.1, Melena (includes Hematochezia)                           R93.3, Abnormal findings on diagnostic imaging of                            other parts of digestive tract CPT copyright 2022 [employer]. All rights reserved. The codes documented in this report are preliminary and upon coder review may  be revised to meet current compliance requirements. Lamar HERO. Nakeita Styles, MD Lamar Ozell Hollingshead, MD 12/31/2023 2:12:46 PM This report has been signed electronically. Number of Addenda: 0

## 2023-12-31 NOTE — Anesthesia Preprocedure Evaluation (Signed)
 Anesthesia Evaluation  Patient identified by MRN, date of birth, ID band Patient awake    Reviewed: Allergy & Precautions, H&P , NPO status , Patient's Chart, lab work & pertinent test results, reviewed documented beta blocker date and time   Airway Mallampati: II  TM Distance: >3 FB Neck ROM: full    Dental no notable dental hx.    Pulmonary neg pulmonary ROS, asthma , Current Smoker and Patient abstained from smoking.   Pulmonary exam normal breath sounds clear to auscultation       Cardiovascular Exercise Tolerance: Good hypertension, +CHF  negative cardio ROS  Rhythm:regular Rate:Normal     Neuro/Psych  Headaches PSYCHIATRIC DISORDERS Anxiety Depression     Neuromuscular disease negative neurological ROS  negative psych ROS   GI/Hepatic negative GI ROS, Neg liver ROS, hiatal hernia, PUD,GERD  ,,  Endo/Other  negative endocrine ROS    Renal/GU Renal diseasenegative Renal ROS  negative genitourinary   Musculoskeletal   Abdominal   Peds  Hematology negative hematology ROS (+) Blood dyscrasia, anemia   Anesthesia Other Findings   Reproductive/Obstetrics negative OB ROS                              Anesthesia Physical Anesthesia Plan  ASA: 3 and emergent  Anesthesia Plan: MAC   Post-op Pain Management:    Induction:   PONV Risk Score and Plan: Propofol infusion  Airway Management Planned:   Additional Equipment:   Intra-op Plan:   Post-operative Plan:   Informed Consent: I have reviewed the patients History and Physical, chart, labs and discussed the procedure including the risks, benefits and alternatives for the proposed anesthesia with the patient or authorized representative who has indicated his/her understanding and acceptance.     Dental Advisory Given  Plan Discussed with: CRNA  Anesthesia Plan Comments:         Anesthesia Quick Evaluation

## 2023-12-31 NOTE — Progress Notes (Signed)
 Off floor to endo for colonoscopy

## 2023-12-31 NOTE — Progress Notes (Signed)
 PROGRESS NOTE  Renee Gutierrez FMW:[id number] DOB: 1960-11-16 DOA: 12/28/2023 PCP: Patient, No Pcp Per  Brief History:  63 year old female with a history of multiple comorbidities including PUD, GERD, IBS, HTN, CKD, fibromyalgia, migraines, restless leg syndrome, , left facial pain, trigeminal neuralgia, cerebral AVM involving brainstem, hiatal hernia status post Nissen fundoplication 20 years ago, recurrent nausea vomiting and recent multiple hospitalizations  She was discharged on 12/24/23 with similar complaints and had three admissions in Oct 2025 with similar issues.   10/17 EGD---underwent EGD yesterday which showed a 3 cm paraesophageal hernia with a prior Nissen fundoplication and presence of a nonbleeding gastric ulcer measuring 2 cm and with a nonbleeding visible vessel which was ablated with bipolar probe. Rest of the examination was normal.   11/28/23 EGD shows large ulcer in the first portion of her stomach just distal to the esophagus. This is most likely due to her NSAID use   Patient stated going home in good condition 2 days ago and able to tolerate diet, she ate some chicken finger strips last night and started similar intractable nausea, anterior chest wall pain, abdominal pain and multiple episode of vomiting and some streaks of blood with one of the vomiting.   In the emergency room, blood pressure is stable and elevated. WC count 14.5, hemoglobin 12.8, potassium 2.7. KUB without any complications. Patient was given 1 L fluid bolus, 20 mEq potassium, 1 dose of Dilaudid  and Compazine . Persistently symptomatic. Admitted for treatment.    Assessment/Plan: Intractable nausea and vomiting -  history of GERD, hiatal hernia-and Nissen fundoplication. -Some improvement in her symptoms after receiving fluid resuscitation, IV PPI and antiemetics - 11/10 CT AP--colonic wall thickening with a skip lesions and concern for IBD  - GI consult appreciated -11/12  colonoscopy--one ascending colon polyp;  min fibrotic appearing ascending segment, normal TI;  pt likely had self limited colitis  hypokalemia/hypophosphatemia  -replete -recheck labs  Hyperlipidemia Continue statins when tolerating p.o.   GERD (gastroesophageal reflux disease) Continue Protonix  40 mg IV twice daily -Carafate p.o. 3 times daily      Hypokalemia - Due to hyperemesis -Replacing both potassium and magnesium       Essential hypertension - continue amlodipine  -restart ACEi, continue normodyne --increase to 300 mg bid   Esophageal hiatal hernia - Chronic, present on imaging -Patient has presence of ulceration of the cardia in a atypicaln location.  It is very possible this is related to the herniated sac and ischemia related to this  -GI sent referral to [facility]   GAD (generalized anxiety disorder) - Currently stable, continue as needed Xanax as needed IV Ativan or Haldol - continue duloxetine    Opioid dependent, chronic pain  - Continue with IV analgesics, once tolerating p.o. resuming home regimen - PDMP reviewed  - morphine  IR 15 mg, #60, last fill 12/05/23 -morphine  ER 15 mg, #60, last fill 12/05/23 -lyrica  50 mg, #90, last fill 12/09/23           Family Communication:   spouse at bedside 11/12  Consultants:  GI  Code Status:  FULL  DVT Prophylaxis:  Emlyn Lovenox    Procedures: As Listed in Progress Note Above  Antibiotics: None       Subjective: Pt has upper abd pain--about same.  Denies n/v/d cp, sob, f/c  Objective: Vitals:   12/31/23 1400 12/31/23 1408 12/31/23 1421 12/31/23 1630  BP: (!) 146/75  (!) 182/98 (!) 181/103  Pulse: 90  87 88 (!) 101  Resp: 20 17    Temp: 97.7 F (36.5 C)  98.1 F (36.7 C)   TempSrc:   Oral   SpO2: 100% 100% 98%   Weight:      Height:       No intake or output data in the 24 hours ending 12/31/23 1753 Weight change:  Exam:  General:  Pt is alert, follows commands  appropriately, not in acute distress HEENT: No icterus, No thrush, No neck mass, [place]/AT Cardiovascular: RRR, S1/S2, no rubs, no gallops Respiratory: CTA bilaterally, no wheezing, no crackles, no rhonchi Abdomen: Soft/+BS, epigastric tender, non distended, no guarding Extremities: No edema, No lymphangitis, No petechiae, No rashes, no synovitis   Data Reviewed: I have personally reviewed following labs and imaging studies Basic Metabolic Panel: Recent Labs  Lab 12/28/23 1031 12/29/23 0459  NA 141 135  K 2.7* 3.5  CL 107 105  CO2 21* 19*  GLUCOSE 86 128*  BUN 12 8  CREATININE 0.66 0.56  CALCIUM  9.1 8.4*  MG  --  1.7  PHOS  --  2.4*   Liver Function Tests: Recent Labs  Lab 12/28/23 1031  AST 11*  ALT <5  ALKPHOS 78  BILITOT <0.2  PROT 6.3*  ALBUMIN 3.5   Recent Labs  Lab 12/28/23 1031  LIPASE 18   No results for input(s): AMMONIA in the last 168 hours. Coagulation Profile: Recent Labs  Lab 12/28/23 1247  INR 0.9   CBC: Recent Labs  Lab 12/28/23 1031 12/29/23 0459  WBC 14.5* 13.7*  HGB 10.8* 11.1*  HCT 34.0* 34.8*  MCV 84.6 84.3  PLT 481* 472*   Cardiac Enzymes: No results for input(s): CKTOTAL, CKMB, CKMBINDEX, TROPONINI in the last 168 hours. BNP: Invalid input(s): POCBNP CBG: No results for input(s): GLUCAP in the last 168 hours. HbA1C: No results for input(s): HGBA1C in the last 72 hours. Urine analysis:    Component Value Date/Time   COLORURINE STRAW (A) 11/25/2023 2053   APPEARANCEUR CLEAR 11/25/2023 2053   LABSPEC 1.011 11/25/2023 2053   PHURINE 7.0 11/25/2023 2053   GLUCOSEU NEGATIVE 11/25/2023 2053   HGBUR NEGATIVE 11/25/2023 2053   BILIRUBINUR NEGATIVE 11/25/2023 2053   KETONESUR 20 (A) 11/25/2023 2053   PROTEINUR NEGATIVE 11/25/2023 2053   UROBILINOGEN 0.2 12/03/2014 1500   NITRITE NEGATIVE 11/25/2023 2053   LEUKOCYTESUR NEGATIVE 11/25/2023 2053   Sepsis Labs: @LABRCNTIP (procalcitonin:4,lacticidven:4) )No  results found for this or any previous visit (from the past 240 hours).   Scheduled Meds:  amLODipine   10 mg Oral Daily   DULoxetine   60 mg Oral BID   enoxaparin  (LOVENOX ) injection  30 mg Subcutaneous Q24H   labetalol   10 mg Intravenous Once   labetalol   300 mg Oral BID   lisinopril   10 mg Oral Daily   pantoprazole   40 mg Intravenous Q12H   phenytoin   100 mg Oral BID   polyethylene glycol  17 g Oral BID   rOPINIRole   2 mg Oral Daily   rOPINIRole   4 mg Oral QHS   spironolactone  25 mg Oral Daily   sucralfate  1 g Oral TID WC & HS   Continuous Infusions:  Procedures/Studies: CT CHEST ABDOMEN PELVIS W CONTRAST Result Date: 12/29/2023 CLINICAL DATA:  Unintended weight loss, paraesophageal hernia, persistent chest and abdominal pain * Tracking Code: BO * EXAM: CT CHEST, ABDOMEN, AND PELVIS WITH CONTRAST TECHNIQUE: Multidetector CT imaging of the chest, abdomen and pelvis was performed following the standard protocol during  bolus administration of intravenous contrast. RADIATION DOSE REDUCTION: This exam was performed according to the departmental dose-optimization program which includes automated exposure control, adjustment of the mA and/or kV according to patient size and/or use of iterative reconstruction technique. CONTRAST:  OMNIPAQUE  IOHEXOL  300 MG/ML  SOLN COMPARISON:  CT abdomen pelvis, 12/23/2023, CT chest angiogram, 11/25/2023 FINDINGS: CT CHEST FINDINGS Cardiovascular: Aortic atherosclerosis. Normal heart size. Unchanged small pericardial effusion. Mediastinum/Nodes: No enlarged mediastinal, hilar, or axillary lymph nodes. Large hiatal hernia with intrathoracic position of the gastric fundus; appearance suggests prior fundoplication. Thyroid gland, trachea, and esophagus demonstrate no significant findings. Lungs/Pleura: Moderate left, small right pleural effusions and associated atelectasis or consolidation. Mild centrilobular emphysema. Musculoskeletal: No chest wall abnormality.  No acute osseous findings. CT ABDOMEN PELVIS FINDINGS Hepatobiliary: No focal liver abnormality is seen. Status post cholecystectomy. Postoperative biliary ductal dilatation. Pancreas: Unremarkable. No pancreatic ductal dilatation or surrounding inflammatory changes. Spleen: Normal in size without significant abnormality. Adrenals/Urinary Tract: Adrenal glands are unremarkable. Kidneys are normal, without renal calculi, solid lesion, or hydronephrosis. Bladder is unremarkable. Stomach/Bowel: Stomach is within normal limits. Appendix not clearly visualized. Long segment colonic wall thickening and mucosal hyperenhancement involving multiple segments of colon from the cecum to the rectum, with multiple interposed segments of sparing. Vascular/Lymphatic: Aortic atherosclerosis. No enlarged abdominal or pelvic lymph nodes. Reproductive: Hysterectomy. Other: No abdominal wall hernia or abnormality. No ascites. Musculoskeletal: No acute osseous findings. IMPRESSION: 1. Long segment colonic wall thickening and mucosal hyperenhancement involving multiple segments of colon from the cecum to the rectum, with multiple interposed segments of sparing. Findings are consistent with nonspecific infectious, inflammatory, or ischemic colitis, appearance of skip lesions suggesting Crohn's colitis. 2. Moderate left, small right pleural effusions and associated atelectasis or consolidation. 3. Large hiatal hernia with intrathoracic position of the gastric fundus; appearance suggests prior fundoplication. 4. Emphysema. 5. Cholecystectomy. Aortic Atherosclerosis (ICD10-I70.0) and Emphysema (ICD10-J43.9). Electronically Signed   By: Marolyn JONETTA Jaksch M.D.   On: 12/29/2023 16:13   DG Abd 2 Views Result Date: 12/28/2023 CLINICAL DATA:  855384 Pain [id number] EXAM: ABDOMEN - 2 VIEW COMPARISON:  Correct over sixteenth 2025, December 01, 2023. FINDINGS: Air and stool-filled nondilated loops of bowel. Mildly edematous appearance of the walls of loops  of small bowel. Mild colonic stool burden diffusely throughout the colon. Visualized lung bases are unremarkable. Favored hiatal hernia. Degenerative changes of the lumbar spine. Status post cholecystectomy. Levoscoliosis of the lumbar spine IMPRESSION: 1. Nonobstructive bowel gas pattern. 2. Mildly edematous appearance of the walls of loops of small bowel. This is nonspecific but could reflect enteritis. Electronically Signed   By: Corean Salter M.D.   On: 12/28/2023 10:46   DG Chest 2 View Result Date: 12/28/2023 CLINICAL DATA:  Chest pain EXAM: DG CHEST 2V COMPARISON:  December 04, 2023 FINDINGS: The cardiomediastinal silhouette is unchanged in contour.Rounded density over the midline likely reflecting a large hiatal hernia. Atherosclerotic calcifications of the aorta. No pleural effusion. No pneumothorax. No acute pleuroparenchymal abnormality. Lucency projecting anterior to the the xiphoid process on lateral radiograph could reflect summation artifact versus a ventral hernia. Multilevel degenerative changes of the thoracic spine. IMPRESSION: 1. No acute cardiopulmonary abnormality. Favored underlying large hiatal hernia 2. Lucency projecting anterior to the xiphoid process on lateral radiograph could reflect summation artifact versus a ventral hernia. Recommend correlation with physical exam. Electronically Signed   By: Corean Salter M.D.   On: 12/28/2023 10:43   CT ABDOMEN PELVIS W CONTRAST Result Date: 12/23/2023 EXAM: CT ABDOMEN  AND PELVIS WITH CONTRAST 12/23/2023 02:41:11 PM TECHNIQUE: CT of the abdomen and pelvis was performed with the administration of 100 mL of iohexol  (OMNIPAQUE ) 300 MG/ML solution. Multiplanar reformatted images are provided for review. Automated exposure control, iterative reconstruction, and/or weight-based adjustment of the mA/kV was utilized to reduce the radiation dose to as low as reasonably achievable. COMPARISON: CT Abd-pelv W/ 11/25/2023 and 12/04/2023. CLINICAL  HISTORY: Upper abdominal pain with intractable nausea and vomiting. FINDINGS: LOWER CHEST: Small left pleural effusion. Left lower lobe subsegmental atelectasis. Small pericardial effusion is similar to 12/04/2023. There is adjacent free fluid in the posterior mediastinum. LIVER: The liver is unremarkable. GALLBLADDER AND BILE DUCTS: Cholecystectomy. No biliary ductal dilatation. SPLEEN: No acute abnormality. PANCREAS: No acute abnormality. ADRENAL GLANDS: Stable adrenal glands. KIDNEYS, URETERS AND BLADDER: No stones in the kidneys or ureters. No hydronephrosis. No perinephric or periureteral stranding. Urinary bladder is unremarkable. GI AND BOWEL: Postoperative change of Nissen fundoplication. Moderate hiatal hernia containing the fundoplication. Wall thickening about the distal esophagus is partially visualized and may in part be postoperative however esophagitis is not excluded. Stomach demonstrates no acute abnormality. There is no bowel obstruction. Normal appendix. PERITONEUM AND RETROPERITONEUM: No ascites. No free air. VASCULATURE: Aorta is normal in caliber. Aortic atherosclerotic calcification. LYMPH NODES: No lymphadenopathy. REPRODUCTIVE ORGANS: No acute abnormality. BONES AND SOFT TISSUES: No acute osseous abnormality. No focal soft tissue abnormality. IMPRESSION: 1. Postoperative change of Nissen fundoplication with moderate hiatal hernia containing the fundoplication; distal esophageal wall thickening may be postoperative, however esophagitis is not excluded. Adjacent free fluid in the posterior mediastinum. Consider CT of the chest with IV contrast and 100 ml oral contrast administered a few minutes prior to the exam to better evaluate the hernia and fundoplication. 2. Small left pleural effusion with left lower lobe subsegmental atelectasis, decreased from 10 / 16 / 25. 3. Small pericardial effusion, similar to 12/04/23. Electronically signed by: Gutierrez Gatlin MD 12/23/2023 07:35 PM EST RP  Workstation: [id number]   DG Chest 2 View Result Date: 12/21/2023 EXAM: 2 VIEW(S) XRAY OF THE CHEST 12/21/2023 02:47:01 PM COMPARISON: Comparison 9 days ago. CLINICAL HISTORY: chest pain chest pain FINDINGS: LUNGS AND PLEURA: Increased left basilar opacity is noted concerning for worsening pneumonia or atelectasis with associated effusion. Minimal right pleural effusion is noted. No pulmonary edema. No pneumothorax. HEART AND MEDIASTINUM: No acute abnormality of the cardiac and mediastinal silhouettes. BONES AND SOFT TISSUES: No acute osseous abnormality. IMPRESSION: 1. Worsening left basilar opacity with associated effusion, favoring pneumonia versus atelectasis. 2. Minimal right pleural effusion. Electronically signed by: Lynwood Seip MD 12/21/2023 02:49 PM EST RP Workstation: [id number]   ECHOCARDIOGRAM COMPLETE Result Date: 12/12/2023    ECHOCARDIOGRAM REPORT   Patient Name:   FAYETTE GASNER Karabin Date of Exam: 12/12/2023 Medical Rec #:  [id number]         Height:       59.0 in Accession #:    [id number]        Weight:       103.0 lb Date of Birth:  1960-07-12          BSA:          1.391 m Patient Age:    63 years          BP:           139/83 mmHg Patient Gender: F                 HR:  107 bpm. Exam Location:  [facility] Procedure: 2D Echo, Strain Analysis, Cardiac Doppler and Color Doppler (Both            Spectral and Color Flow Doppler were utilized during procedure). Indications:    CHF- Acute Diastolic I50.31  History:        Patient has no prior history of Echocardiogram examinations.                 Signs/Symptoms:Chest Pain; Risk Factors:Hypertension,                 Pre-diabetes, Dyslipidemia and Current Smoker.  Sonographer:    Koleen Popper RDCS Referring Phys: [phone] CARLOS MADERA  Sonographer Comments: Image acquisition challenging due to respiratory motion. Global longitudinal strain was attempted. IMPRESSIONS  1. Left ventricular ejection fraction, by estimation, is >75%. The left  ventricle has hyperdynamic function. The left ventricle has no regional wall motion abnormalities. Left ventricular diastolic parameters are consistent with Grade I diastolic dysfunction (impaired relaxation). The average left ventricular global longitudinal strain is -18.9 %. The global longitudinal strain is normal.  2. Right ventricular systolic function is normal. The right ventricular size is normal. Tricuspid regurgitation signal is inadequate for assessing PA pressure.  3. Moderate pericardial effusion is anterior to the right ventricle. Small pericardial effusion is lateral to LV and localized along RA. There is no evidence of cardiac tamponade.  4. The mitral valve is normal in structure. No evidence of mitral valve regurgitation. No evidence of mitral stenosis.  5. The aortic valve is tricuspid. Aortic valve regurgitation is not visualized. No aortic stenosis is present.  6. The inferior vena cava is normal in size with greater than 50% respiratory variability, suggesting right atrial pressure of 3 mmHg. Comparison(s): No prior Echocardiogram. FINDINGS  Left Ventricle: Left ventricular ejection fraction, by estimation, is >75%. The left ventricle has hyperdynamic function. The left ventricle has no regional wall motion abnormalities. The average left ventricular global longitudinal strain is -18.9 %. Strain was performed and the global longitudinal strain is normal. The left ventricular internal cavity size was normal in size. There is no left ventricular hypertrophy. Left ventricular diastolic parameters are consistent with Grade I diastolic dysfunction (impaired relaxation). Normal left ventricular filling pressure. Right Ventricle: The right ventricular size is normal. No increase in right ventricular wall thickness. Right ventricular systolic function is normal. Tricuspid regurgitation signal is inadequate for assessing PA pressure. Left Atrium: Left atrial size was normal in size. Right Atrium: Right  atrial size was normal in size. Pericardium: A moderately sized pericardial effusion is present. The pericardial effusion is anterior to the right ventricle. There is no evidence of cardiac tamponade. Mitral Valve: The mitral valve is normal in structure. No evidence of mitral valve regurgitation. No evidence of mitral valve stenosis. Tricuspid Valve: The tricuspid valve is normal in structure. Tricuspid valve regurgitation is not demonstrated. No evidence of tricuspid stenosis. Aortic Valve: The aortic valve is tricuspid. Aortic valve regurgitation is not visualized. No aortic stenosis is present. Pulmonic Valve: The pulmonic valve was grossly normal. Pulmonic valve regurgitation is not visualized. No evidence of pulmonic stenosis. Aorta: The aortic root and ascending aorta are structurally normal, with no evidence of dilitation. Venous: The inferior vena cava is normal in size with greater than 50% respiratory variability, suggesting right atrial pressure of 3 mmHg. IAS/Shunts: No atrial level shunt detected by color flow Doppler. Additional Comments: 3D was performed not requiring image post processing on an independent workstation and was  indeterminate.  LEFT VENTRICLE PLAX 2D LVIDd:         3.40 cm   Diastology LVIDs:         2.00 cm   LV e' medial:    5.44 cm/s LV PW:         1.10 cm   LV E/e' medial:  12.9 LV IVS:        1.10 cm   LV e' lateral:   9.25 cm/s LVOT diam:     1.80 cm   LV E/e' lateral: 7.6 LV SV:         47 LV SV Index:   34        2D Longitudinal Strain LVOT Area:     2.54 cm  2D Strain GLS Avg:     -18.9 %  RIGHT VENTRICLE             IVC RV Basal diam:  2.60 cm     IVC diam: 1.00 cm RV S prime:     20.00 cm/s TAPSE (M-mode): 2.2 cm LEFT ATRIUM             Index        RIGHT ATRIUM          Index LA diam:        2.70 cm 1.94 cm/m   RA Area:     8.90 cm LA Vol (A2C):   28.1 ml 20.20 ml/m  RA Volume:   14.30 ml 10.28 ml/m LA Vol (A4C):   32.5 ml 23.36 ml/m LA Biplane Vol: 31.7 ml 22.79  ml/m  AORTIC VALVE LVOT Vmax:   111.00 cm/s LVOT Vmean:  72.800 cm/s LVOT VTI:    0.184 m  AORTA Ao Root diam: 3.00 cm Ao Asc diam:  2.90 cm MITRAL VALVE MV Area (PHT): 5.46 cm     SHUNTS MV Decel Time: 139 msec     Systemic VTI:  0.18 m MV E velocity: 70.20 cm/s   Systemic Diam: 1.80 cm MV A velocity: 112.00 cm/s MV E/A ratio:  0.63 Vishnu Priya Mallipeddi Electronically signed by Diannah Late Mallipeddi Signature Date/Time: 12/12/2023/4:59:17 PM    Final    DG Chest Port 1 View Result Date: 12/12/2023 EXAM: 1 VIEW(S) XRAY OF THE CHEST 12/12/2023 04:42:00 AM COMPARISON: 12/01/2023 CLINICAL HISTORY: CP, HTN, leg swelling. CP, HTN, leg/foot swelling and pain, elevated BP @ time of imaging (203/93), painful stomach palpitations. FINDINGS: LUNGS AND PLEURA: Left base atelectasis. Mild right basilar atelectasis or scarring. No pulmonary edema. No pleural effusion. Skin fold artifact right chest. No pneumothorax. HEART AND MEDIASTINUM: Aortic calcification. Moderate chronic hiatal hernia. BONES AND SOFT TISSUES: Cervical spine surgical hardware noted. No acute osseous abnormality. IMPRESSION: 1. No acute cardiopulmonary abnormality ;minor lung base atelectasis. 2. Moderate chronic hiatal hernia. Electronically signed by: Helayne Hurst MD 12/12/2023 05:58 AM EDT RP Workstation: [id number]   CT ANGIO GI BLEED Result Date: 12/04/2023 EXAM: CTA ABDOMEN AND PELVIS WITH CONTRAST 12/04/2023 09:59:01 AM TECHNIQUE: CTA images of the abdomen and pelvis with intravenous contrast. Three-dimensional MIP/volume rendered formations were performed. Automated exposure control, iterative reconstruction, and/or weight based adjustment of the mA/kV was utilized to reduce the radiation dose to as low as reasonably achievable. COMPARISON: CTA chest and CT abdomen and pelvis 11/25/2023. CLINICAL HISTORY: 63 year old female with abdominal pain, hematemesis, and suspected bleeding ulcers. FINDINGS: VASCULATURE: Stable aortoiliac  atherosclerosis. Major arterial structures are patent and stable. Portal venous system appears patent on the delayed images. GI BLEED:  No contrast extravasation identified into the stomach or duodenum, or elswhere. ABDOMEN/PELVIS: LOWER CHEST: Small but increased layering left pleural effusion with simple fluid density. Associated increased left lung base atelectasis or consolidation. No pericardial effusion. Right lung base more stable with curvilinear scarring or atelectasis. LIVER: The liver is unremarkable. GALLBLADDER AND BILE DUCTS: Chronic cholecystectomy. Chronic post cholecystectomy CBD enlargement is stable. SPLEEN: The spleen is unremarkable. PANCREAS: The pancreas is unremarkable. ADRENAL GLANDS: Bilateral adrenal glands demonstrate no acute abnormality. KIDNEYS, URETERS AND BLADDER: Symmetric renal enhancement and early contrast excretion. No stones in the kidneys or ureters. No hydronephrosis. No perinephric or periureteral stranding. Urinary bladder is unremarkable. GI AND BOWEL: Large gastrocaudal hernia redemonstrated. Evidence of chronic fundoplication and herniated appearance of the fundoplication. No definite acute gastroduodenal inflammation. Non dilated large and small bowel loops. There is no bowel obstruction. No abnormal bowel wall thickening or distension. REPRODUCTIVE: Reproductive organs are unremarkable. PERITONEUM AND RETROPERITONEUM: No pneumoperitoneum. No free fluid identified. LYMPH NODES: No lymphadenopathy. BONES AND SOFT TISSUES: Chronic spine degeneration in the setting of advanced levoconvex scoliosis. No acute abnormality of the bones. No acute soft tissue abnormality. IMPRESSION: 1. 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 No active GI bleeding by CTA. No acute bowel inflammation identified. 2. Small but increased left pleural effusion and associated increased left lung base atelectasis or consolidation. Electronically signed by:  Helayne Hurst MD 12/04/2023 10:11 AM EDT RP Workstation: [id number]    Alm Schneider, DO  [facility]  If 7PM-7AM, please contact night-coverage [email or web address] Password [email or web address] 12/31/2023, 5:53 PM   LOS: 2 days

## 2023-12-31 NOTE — Hospital Course (Signed)
 63 year old female with a history of multiple comorbidities including PUD, GERD, IBS, HTN, CKD, fibromyalgia, migraines, restless leg syndrome, , left facial pain, trigeminal neuralgia, cerebral AVM involving brainstem, hiatal hernia status post Nissen fundoplication 20 years ago, recurrent nausea vomiting and recent multiple hospitalizations  She was discharged on 12/24/23 with similar complaints and had three admissions in Oct 2025 with similar issues.   10/17 EGD---underwent EGD yesterday which showed a 3 cm paraesophageal hernia with a prior Nissen fundoplication and presence of a nonbleeding gastric ulcer measuring 2 cm and with a nonbleeding visible vessel which was ablated with bipolar probe. Rest of the examination was normal.   11/28/23 EGD shows large ulcer in the first portion of her stomach just distal to the esophagus. This is most likely due to her NSAID use   Patient stated going home in good condition 2 days ago and able to tolerate diet, she ate some chicken finger strips last night and started similar intractable nausea, anterior chest wall pain, abdominal pain and multiple episode of vomiting and some streaks of blood with one of the vomiting.   In the emergency room, blood pressure is stable and elevated. WC count 14.5, hemoglobin 12.8, potassium 2.7. KUB without any complications. Patient was given 1 L fluid bolus, 20 mEq potassium, 1 dose of Dilaudid  and Compazine . Persistently symptomatic. Admitted for treatment.

## 2024-01-01 ENCOUNTER — Encounter (HOSPITAL_COMMUNITY): Payer: Self-pay | Admitting: Internal Medicine

## 2024-01-01 DIAGNOSIS — F1120 Opioid dependence, uncomplicated: Secondary | ICD-10-CM

## 2024-01-01 DIAGNOSIS — R112 Nausea with vomiting, unspecified: Secondary | ICD-10-CM

## 2024-01-01 DIAGNOSIS — I10 Essential (primary) hypertension: Secondary | ICD-10-CM | POA: Diagnosis not present

## 2024-01-01 DIAGNOSIS — R197 Diarrhea, unspecified: Secondary | ICD-10-CM | POA: Diagnosis not present

## 2024-01-01 DIAGNOSIS — E876 Hypokalemia: Secondary | ICD-10-CM | POA: Diagnosis not present

## 2024-01-01 LAB — CBC
HCT: 35.5 % — ABNORMAL LOW (ref 36.0–46.0)
Hemoglobin: 11.3 g/dL — ABNORMAL LOW (ref 12.0–15.0)
MCH: 26.5 pg (ref 26.0–34.0)
MCHC: 31.8 g/dL (ref 30.0–36.0)
MCV: 83.1 fL (ref 80.0–100.0)
Platelets: 702 K/uL — ABNORMAL HIGH (ref 150–400)
RBC: 4.27 MIL/uL (ref 3.87–5.11)
RDW: 15 % (ref 11.5–15.5)
WBC: 9.3 K/uL (ref 4.0–10.5)
nRBC: 0 % (ref 0.0–0.2)

## 2024-01-01 LAB — MAGNESIUM: Magnesium: 2 mg/dL (ref 1.7–2.4)

## 2024-01-01 LAB — BASIC METABOLIC PANEL WITH GFR
Anion gap: 12 (ref 5–15)
BUN: 7 mg/dL — ABNORMAL LOW (ref 8–23)
CO2: 22 mmol/L (ref 22–32)
Calcium: 9.2 mg/dL (ref 8.9–10.3)
Chloride: 105 mmol/L (ref 98–111)
Creatinine, Ser: 0.55 mg/dL (ref 0.44–1.00)
GFR, Estimated: >60 mL/min (ref >60)
Glucose, Bld: 105 mg/dL — ABNORMAL HIGH (ref 70–99)
Potassium: 3.3 mmol/L — ABNORMAL LOW (ref 3.5–5.1)
Sodium: 140 mmol/L (ref 135–145)

## 2024-01-01 LAB — PHOSPHORUS: Phosphorus: 2.9 mg/dL (ref 2.5–4.6)

## 2024-01-01 LAB — SURGICAL PATHOLOGY

## 2024-01-01 MED ORDER — LISINOPRIL 10 MG PO TABS: 20.0000 mg | ORAL_TABLET | Freq: Every day | ORAL | Status: DC

## 2024-01-01 MED ORDER — LISINOPRIL 20 MG PO TABS: 20.0000 mg | ORAL_TABLET | Freq: Every day | ORAL | 1 refills | Status: DC

## 2024-01-01 MED ORDER — LABETALOL HCL 300 MG PO TABS: 300.0000 mg | ORAL_TABLET | Freq: Two times a day (BID) | ORAL | 1 refills | Status: DC

## 2024-01-01 MED ORDER — POTASSIUM CHLORIDE 20 MEQ PO PACK
40.0000 meq | PACK | Freq: Once | ORAL | Status: AC
  Administered 2024-01-01: 40 meq via ORAL
  Filled 2024-01-01: qty 2

## 2024-01-01 NOTE — Discharge Summary (Signed)
 Physician Discharge Summary   Patient: Renee Gutierrez MRN: [id number] DOB: [birth year]  Admit date:     12/28/2023  Discharge date: 01/01/24  Discharge Physician: Alm Tabor Bartram   PCP: Patient, No Pcp Per   Recommendations at discharge:   Please follow up with primary care provider within 1-2 weeks  Please repeat BMP and CBC in one week     Hospital Course: 63 year old female with a history of multiple comorbidities including PUD, GERD, IBS, HTN, CKD, fibromyalgia, migraines, restless leg syndrome, , left facial pain, trigeminal neuralgia, cerebral AVM involving brainstem, hiatal hernia status post Nissen fundoplication 20 years ago, recurrent nausea vomiting and recent multiple hospitalizations  She was discharged on 12/24/23 with similar complaints and had three admissions in Oct 2025 with similar issues.   10/17 EGD---underwent EGD yesterday which showed a 3 cm paraesophageal hernia with a prior Nissen fundoplication and presence of a nonbleeding gastric ulcer measuring 2 cm and with a nonbleeding visible vessel which was ablated with bipolar probe. Rest of the examination was normal.   11/28/23 EGD shows large ulcer in the first portion of her stomach just distal to the esophagus. This is most likely due to her NSAID use   Patient stated going home in good condition 2 days ago and able to tolerate diet, she ate some chicken finger strips last night and started similar intractable nausea, anterior chest wall pain, abdominal pain and multiple episode of vomiting and some streaks of blood with one of the vomiting.   In the emergency room, blood pressure is stable and elevated. WC count 14.5, hemoglobin 12.8, potassium 2.7. KUB without any complications. Patient was given 1 L fluid bolus, 20 mEq potassium, 1 dose of Dilaudid  and Compazine . Persistently symptomatic. Admitted for treatment.   Assessment and Plan: Intractable nausea and vomiting -  history of GERD, hiatal hernia-and Nissen  fundoplication. -Some improvement in her symptoms after receiving fluid resuscitation, IV PPI and antiemetics - 11/10 CT AP--colonic wall thickening with a skip lesions and concern for IBD  - GI consult appreciated -11/12 colonoscopy--one ascending colon polyp;  min fibrotic appearing ascending segment, normal TI;  pt likely had self limited colitis -diet advanced after colonoscopy which pt tolerated without n/v   hypokalemia/hypophosphatemia  -repleted -rechecked labs  Hyperlipidemia Continue statins when tolerating p.o.   GERD (gastroesophageal reflux disease) Continue Protonix  40 mg IV twice daily -Carafate p.o. 3 times daily      Hypokalemia - Due to hyperemesis -Replacing both potassium and magnesium       Essential hypertension - continue amlodipine  -restart ACEi, continue normodyne --increase to 300 mg bid - increase lisinopril  to 20 mg daily   Esophageal hiatal hernia - Chronic, present on imaging -Patient has presence of ulceration of the cardia in a atypicaln location.  It is very possible this is related to the herniated sac and ischemia related to this  -GI sent referral to [facility]   GAD (generalized anxiety disorder) - Currently stable, continue as needed Xanax as needed IV Ativan or Haldol - continue duloxetine    Opioid dependent, chronic pain  - Continue with IV analgesics, once tolerating p.o. resuming home regimen - PDMP reviewed  - morphine  IR 15 mg, #60, last fill 12/05/23 -morphine  ER 15 mg, #60, last fill 12/05/23 -lyrica  50 mg, #90, last fill 12/09/23         Consultants: GI Procedures performed: colonoscopy as above Disposition: Home Diet recommendation:  Soft diet DISCHARGE MEDICATION: Allergies as of 01/01/2024  Reactions   Buprenorphine Hcl Swelling   All extremities and neck and face swelled up huge according to patient   Bupropion Swelling, Other (See Comments)   Moody and depressed   Codeine Itching, Nausea And Vomiting,  Nausea Only, Swelling   Sulfamethoxazole-trimethoprim Swelling, Other (See Comments)   Swelled up her eye   Nsaids Other (See Comments)   Unknown    Pregabalin  Swelling   Lyrica     Sulfa Antibiotics Other (See Comments)   Unknown    Tapentadol Nausea And Vomiting   Pt states she can take   Clindamycin Other (See Comments)   Heart burn   Clindamycin/lincomycin Rash, Other (See Comments)   Heart burn   Cymbalta  [duloxetine  Hcl] Swelling   Diclofenac Swelling   Cream    Diclofenac Sodium Swelling   Oral med   Gabapentin Swelling   Oxycodone  Nausea Only   No longer has a reaction per pt   Toradol  [ketorolac  Tromethamine ] Rash        Medication List     STOP taking these medications    erythromycin  ophthalmic ointment   polyethylene glycol-electrolytes 420 g solution Commonly known as: NuLYTELY   pregabalin  50 MG capsule Commonly known as: LYRICA        TAKE these medications    amLODipine  10 MG tablet Commonly known as: NORVASC  Take 1 tablet (10 mg total) by mouth daily.   DULoxetine  60 MG capsule Commonly known as: CYMBALTA  Take 60 mg by mouth 2 (two) times daily.   Gas Relief Extra Strength 125 MG chewable tablet Generic drug: simethicone Chew 125 mg by mouth daily as needed for flatulence.   labetalol  300 MG tablet Commonly known as: NORMODYNE  Take 1 tablet (300 mg total) by mouth 2 (two) times daily. What changed:  medication strength how much to take   lisinopril  20 MG tablet Commonly known as: ZESTRIL  Take 1 tablet (20 mg total) by mouth daily. Start taking on: January 02, 2024 What changed:  medication strength how much to take   morphine  15 MG 12 hr tablet Commonly known as: MS CONTIN  Take 15 mg by mouth every 12 (twelve) hours.   morphine  15 MG tablet Commonly known as: MSIR Take 15 mg by mouth every 4 (four) hours as needed for severe pain (pain score 7-10).   ondansetron  8 MG disintegrating tablet Commonly known as:  ZOFRAN -ODT Take 1 tablet (8 mg total) by mouth every 6 (six) hours as needed for nausea or vomiting.   pantoprazole  40 MG tablet Commonly known as: Protonix  Take 1 tablet (40 mg total) by mouth 2 (two) times daily.   phenytoin  100 MG ER capsule Commonly known as: DILANTIN  Take 100 mg by mouth. What changed: when to take this   prednisoLONE  acetate 1 % ophthalmic suspension Commonly known as: PRED FORTE  Place 1 drop into the left eye in the morning and at bedtime.   prochlorperazine  10 MG tablet Commonly known as: COMPAZINE  Take 1 tablet (10 mg total) by mouth every 6 (six) hours as needed for nausea or vomiting.   rOPINIRole  2 MG tablet Commonly known as: REQUIP  Take 4 mg by mouth See admin instructions. Take 1 tablet in the morning and 2 tablet every evening.   spironolactone 25 MG tablet Commonly known as: ALDACTONE Take 1 tablet (25 mg total) by mouth daily.   sucralfate 1 GM/10ML suspension Commonly known as: CARAFATE Take 10 mLs (1 g total) by mouth 4 (four) times daily -  with meals and at bedtime.  Discharge Exam: Filed Weights   12/28/23 0948 12/28/23 1433  Weight: 44.5 kg 43.4 kg   HEENT:  [place]/AT, No thrush, no icterus CV:  RRR, no rub, no S3, no S4 Lung:  CTA, no wheeze, no rhonchi Abd:  soft/+BS, mild epigastric pain Ext:  No edema, no lymphangitis, no synovitis, no rash   Condition at discharge: stable  The results of significant diagnostics from this hospitalization (including imaging, microbiology, ancillary and laboratory) are listed below for reference.   Imaging Studies: CT CHEST ABDOMEN PELVIS W CONTRAST Result Date: 12/29/2023 CLINICAL DATA:  Unintended weight loss, paraesophageal hernia, persistent chest and abdominal pain * Tracking Code: BO * EXAM: CT CHEST, ABDOMEN, AND PELVIS WITH CONTRAST TECHNIQUE: Multidetector CT imaging of the chest, abdomen and pelvis was performed following the standard protocol during bolus administration of  intravenous contrast. RADIATION DOSE REDUCTION: This exam was performed according to the departmental dose-optimization program which includes automated exposure control, adjustment of the mA and/or kV according to patient size and/or use of iterative reconstruction technique. CONTRAST:  OMNIPAQUE  IOHEXOL  300 MG/ML  SOLN COMPARISON:  CT abdomen pelvis, 12/23/2023, CT chest angiogram, 11/25/2023 FINDINGS: CT CHEST FINDINGS Cardiovascular: Aortic atherosclerosis. Normal heart size. Unchanged small pericardial effusion. Mediastinum/Nodes: No enlarged mediastinal, hilar, or axillary lymph nodes. Large hiatal hernia with intrathoracic position of the gastric fundus; appearance suggests prior fundoplication. Thyroid gland, trachea, and esophagus demonstrate no significant findings. Lungs/Pleura: Moderate left, small right pleural effusions and associated atelectasis or consolidation. Mild centrilobular emphysema. Musculoskeletal: No chest wall abnormality. No acute osseous findings. CT ABDOMEN PELVIS FINDINGS Hepatobiliary: No focal liver abnormality is seen. Status post cholecystectomy. Postoperative biliary ductal dilatation. Pancreas: Unremarkable. No pancreatic ductal dilatation or surrounding inflammatory changes. Spleen: Normal in size without significant abnormality. Adrenals/Urinary Tract: Adrenal glands are unremarkable. Kidneys are normal, without renal calculi, solid lesion, or hydronephrosis. Bladder is unremarkable. Stomach/Bowel: Stomach is within normal limits. Appendix not clearly visualized. Long segment colonic wall thickening and mucosal hyperenhancement involving multiple segments of colon from the cecum to the rectum, with multiple interposed segments of sparing. Vascular/Lymphatic: Aortic atherosclerosis. No enlarged abdominal or pelvic lymph nodes. Reproductive: Hysterectomy. Other: No abdominal wall hernia or abnormality. No ascites. Musculoskeletal: No acute osseous findings. IMPRESSION: 1.  Long segment colonic wall thickening and mucosal hyperenhancement involving multiple segments of colon from the cecum to the rectum, with multiple interposed segments of sparing. Findings are consistent with nonspecific infectious, inflammatory, or ischemic colitis, appearance of skip lesions suggesting Crohn's colitis. 2. Moderate left, small right pleural effusions and associated atelectasis or consolidation. 3. Large hiatal hernia with intrathoracic position of the gastric fundus; appearance suggests prior fundoplication. 4. Emphysema. 5. Cholecystectomy. Aortic Atherosclerosis (ICD10-I70.0) and Emphysema (ICD10-J43.9). Electronically Signed   By: Marolyn JONETTA Jaksch M.D.   On: 12/29/2023 16:13   DG Abd 2 Views Result Date: 12/28/2023 CLINICAL DATA:  855384 Pain [id number] EXAM: ABDOMEN - 2 VIEW COMPARISON:  Correct over sixteenth 2025, December 01, 2023. FINDINGS: Air and stool-filled nondilated loops of bowel. Mildly edematous appearance of the walls of loops of small bowel. Mild colonic stool burden diffusely throughout the colon. Visualized lung bases are unremarkable. Favored hiatal hernia. Degenerative changes of the lumbar spine. Status post cholecystectomy. Levoscoliosis of the lumbar spine IMPRESSION: 1. Nonobstructive bowel gas pattern. 2. Mildly edematous appearance of the walls of loops of small bowel. This is nonspecific but could reflect enteritis. Electronically Signed   By: Corean Salter M.D.   On: 12/28/2023 10:46   DG Chest  2 View Result Date: 12/28/2023 CLINICAL DATA:  Chest pain EXAM: DG CHEST 2V COMPARISON:  December 04, 2023 FINDINGS: The cardiomediastinal silhouette is unchanged in contour.Rounded density over the midline likely reflecting a large hiatal hernia. Atherosclerotic calcifications of the aorta. No pleural effusion. No pneumothorax. No acute pleuroparenchymal abnormality. Lucency projecting anterior to the the xiphoid process on lateral radiograph could reflect summation artifact  versus a ventral hernia. Multilevel degenerative changes of the thoracic spine. IMPRESSION: 1. No acute cardiopulmonary abnormality. Favored underlying large hiatal hernia 2. Lucency projecting anterior to the xiphoid process on lateral radiograph could reflect summation artifact versus a ventral hernia. Recommend correlation with physical exam. Electronically Signed   By: Corean Salter M.D.   On: 12/28/2023 10:43   CT ABDOMEN PELVIS W CONTRAST Result Date: 12/23/2023 EXAM: CT ABDOMEN AND PELVIS WITH CONTRAST 12/23/2023 02:41:11 PM TECHNIQUE: CT of the abdomen and pelvis was performed with the administration of 100 mL of iohexol  (OMNIPAQUE ) 300 MG/ML solution. Multiplanar reformatted images are provided for review. Automated exposure control, iterative reconstruction, and/or weight-based adjustment of the mA/kV was utilized to reduce the radiation dose to as low as reasonably achievable. COMPARISON: CT Abd-pelv W/ 11/25/2023 and 12/04/2023. CLINICAL HISTORY: Upper abdominal pain with intractable nausea and vomiting. FINDINGS: LOWER CHEST: Small left pleural effusion. Left lower lobe subsegmental atelectasis. Small pericardial effusion is similar to 12/04/2023. There is adjacent free fluid in the posterior mediastinum. LIVER: The liver is unremarkable. GALLBLADDER AND BILE DUCTS: Cholecystectomy. No biliary ductal dilatation. SPLEEN: No acute abnormality. PANCREAS: No acute abnormality. ADRENAL GLANDS: Stable adrenal glands. KIDNEYS, URETERS AND BLADDER: No stones in the kidneys or ureters. No hydronephrosis. No perinephric or periureteral stranding. Urinary bladder is unremarkable. GI AND BOWEL: Postoperative change of Nissen fundoplication. Moderate hiatal hernia containing the fundoplication. Wall thickening about the distal esophagus is partially visualized and may in part be postoperative however esophagitis is not excluded. Stomach demonstrates no acute abnormality. There is no bowel obstruction. Normal  appendix. PERITONEUM AND RETROPERITONEUM: No ascites. No free air. VASCULATURE: Aorta is normal in caliber. Aortic atherosclerotic calcification. LYMPH NODES: No lymphadenopathy. REPRODUCTIVE ORGANS: No acute abnormality. BONES AND SOFT TISSUES: No acute osseous abnormality. No focal soft tissue abnormality. IMPRESSION: 1. Postoperative change of Nissen fundoplication with moderate hiatal hernia containing the fundoplication; distal esophageal wall thickening may be postoperative, however esophagitis is not excluded. Adjacent free fluid in the posterior mediastinum. Consider CT of the chest with IV contrast and 100 ml oral contrast administered a few minutes prior to the exam to better evaluate the hernia and fundoplication. 2. Small left pleural effusion with left lower lobe subsegmental atelectasis, decreased from 10 / 16 / 25. 3. Small pericardial effusion, similar to 12/04/23. Electronically signed by: Norman Gatlin MD 12/23/2023 07:35 PM EST RP Workstation: [id number]   DG Chest 2 View Result Date: 12/21/2023 EXAM: 2 VIEW(S) XRAY OF THE CHEST 12/21/2023 02:47:01 PM COMPARISON: Comparison 9 days ago. CLINICAL HISTORY: chest pain chest pain FINDINGS: LUNGS AND PLEURA: Increased left basilar opacity is noted concerning for worsening pneumonia or atelectasis with associated effusion. Minimal right pleural effusion is noted. No pulmonary edema. No pneumothorax. HEART AND MEDIASTINUM: No acute abnormality of the cardiac and mediastinal silhouettes. BONES AND SOFT TISSUES: No acute osseous abnormality. IMPRESSION: 1. Worsening left basilar opacity with associated effusion, favoring pneumonia versus atelectasis. 2. Minimal right pleural effusion. Electronically signed by: Lynwood Seip MD 12/21/2023 02:49 PM EST RP Workstation: [id number]   ECHOCARDIOGRAM COMPLETE Result Date: 12/12/2023    ECHOCARDIOGRAM  REPORT   Patient Name:   Renee Gutierrez Reigel Date of Exam: 12/12/2023 Medical Rec #:  [id number]         Height:        59.0 in Accession #:    [id number]        Weight:       103.0 lb Date of Birth:  March 08, 1960          BSA:          1.391 m Patient Age:    63 years          BP:           139/83 mmHg Patient Gender: F                 HR:           107 bpm. Exam Location:  [facility] Procedure: 2D Echo, Strain Analysis, Cardiac Doppler and Color Doppler (Both            Spectral and Color Flow Doppler were utilized during procedure). Indications:    CHF- Acute Diastolic I50.31  History:        Patient has no prior history of Echocardiogram examinations.                 Signs/Symptoms:Chest Pain; Risk Factors:Hypertension,                 Pre-diabetes, Dyslipidemia and Current Smoker.  Sonographer:    Koleen Popper RDCS Referring Phys: [phone] CARLOS MADERA  Sonographer Comments: Image acquisition challenging due to respiratory motion. Global longitudinal strain was attempted. IMPRESSIONS  1. Left ventricular ejection fraction, by estimation, is >75%. The left ventricle has hyperdynamic function. The left ventricle has no regional wall motion abnormalities. Left ventricular diastolic parameters are consistent with Grade I diastolic dysfunction (impaired relaxation). The average left ventricular global longitudinal strain is -18.9 %. The global longitudinal strain is normal.  2. Right ventricular systolic function is normal. The right ventricular size is normal. Tricuspid regurgitation signal is inadequate for assessing PA pressure.  3. Moderate pericardial effusion is anterior to the right ventricle. Small pericardial effusion is lateral to LV and localized along RA. There is no evidence of cardiac tamponade.  4. The mitral valve is normal in structure. No evidence of mitral valve regurgitation. No evidence of mitral stenosis.  5. The aortic valve is tricuspid. Aortic valve regurgitation is not visualized. No aortic stenosis is present.  6. The inferior vena cava is normal in size with greater than 50% respiratory variability,  suggesting right atrial pressure of 3 mmHg. Comparison(s): No prior Echocardiogram. FINDINGS  Left Ventricle: Left ventricular ejection fraction, by estimation, is >75%. The left ventricle has hyperdynamic function. The left ventricle has no regional wall motion abnormalities. The average left ventricular global longitudinal strain is -18.9 %. Strain was performed and the global longitudinal strain is normal. The left ventricular internal cavity size was normal in size. There is no left ventricular hypertrophy. Left ventricular diastolic parameters are consistent with Grade I diastolic dysfunction (impaired relaxation). Normal left ventricular filling pressure. Right Ventricle: The right ventricular size is normal. No increase in right ventricular wall thickness. Right ventricular systolic function is normal. Tricuspid regurgitation signal is inadequate for assessing PA pressure. Left Atrium: Left atrial size was normal in size. Right Atrium: Right atrial size was normal in size. Pericardium: A moderately sized pericardial effusion is present. The pericardial effusion is anterior to the right ventricle. There is no evidence of  cardiac tamponade. Mitral Valve: The mitral valve is normal in structure. No evidence of mitral valve regurgitation. No evidence of mitral valve stenosis. Tricuspid Valve: The tricuspid valve is normal in structure. Tricuspid valve regurgitation is not demonstrated. No evidence of tricuspid stenosis. Aortic Valve: The aortic valve is tricuspid. Aortic valve regurgitation is not visualized. No aortic stenosis is present. Pulmonic Valve: The pulmonic valve was grossly normal. Pulmonic valve regurgitation is not visualized. No evidence of pulmonic stenosis. Aorta: The aortic root and ascending aorta are structurally normal, with no evidence of dilitation. Venous: The inferior vena cava is normal in size with greater than 50% respiratory variability, suggesting right atrial pressure of 3 mmHg.  IAS/Shunts: No atrial level shunt detected by color flow Doppler. Additional Comments: 3D was performed not requiring image post processing on an independent workstation and was indeterminate.  LEFT VENTRICLE PLAX 2D LVIDd:         3.40 cm   Diastology LVIDs:         2.00 cm   LV e' medial:    5.44 cm/s LV PW:         1.10 cm   LV E/e' medial:  12.9 LV IVS:        1.10 cm   LV e' lateral:   9.25 cm/s LVOT diam:     1.80 cm   LV E/e' lateral: 7.6 LV SV:         47 LV SV Index:   34        2D Longitudinal Strain LVOT Area:     2.54 cm  2D Strain GLS Avg:     -18.9 %  RIGHT VENTRICLE             IVC RV Basal diam:  2.60 cm     IVC diam: 1.00 cm RV S prime:     20.00 cm/s TAPSE (M-mode): 2.2 cm LEFT ATRIUM             Index        RIGHT ATRIUM          Index LA diam:        2.70 cm 1.94 cm/m   RA Area:     8.90 cm LA Vol (A2C):   28.1 ml 20.20 ml/m  RA Volume:   14.30 ml 10.28 ml/m LA Vol (A4C):   32.5 ml 23.36 ml/m LA Biplane Vol: 31.7 ml 22.79 ml/m  AORTIC VALVE LVOT Vmax:   111.00 cm/s LVOT Vmean:  72.800 cm/s LVOT VTI:    0.184 m  AORTA Ao Root diam: 3.00 cm Ao Asc diam:  2.90 cm MITRAL VALVE MV Area (PHT): 5.46 cm     SHUNTS MV Decel Time: 139 msec     Systemic VTI:  0.18 m MV E velocity: 70.20 cm/s   Systemic Diam: 1.80 cm MV A velocity: 112.00 cm/s MV E/A ratio:  0.63 Vishnu Priya Mallipeddi Electronically signed by Diannah Late Mallipeddi Signature Date/Time: 12/12/2023/4:59:17 PM    Final    DG Chest Port 1 View Result Date: 12/12/2023 EXAM: 1 VIEW(S) XRAY OF THE CHEST 12/12/2023 04:42:00 AM COMPARISON: 12/01/2023 CLINICAL HISTORY: CP, HTN, leg swelling. CP, HTN, leg/foot swelling and pain, elevated BP @ time of imaging (203/93), painful stomach palpitations. FINDINGS: LUNGS AND PLEURA: Left base atelectasis. Mild right basilar atelectasis or scarring. No pulmonary edema. No pleural effusion. Skin fold artifact right chest. No pneumothorax. HEART AND MEDIASTINUM: Aortic calcification. Moderate  chronic hiatal hernia. BONES AND SOFT  TISSUES: Cervical spine surgical hardware noted. No acute osseous abnormality. IMPRESSION: 1. No acute cardiopulmonary abnormality ;minor lung base atelectasis. 2. Moderate chronic hiatal hernia. Electronically signed by: Helayne Hurst MD 12/12/2023 05:58 AM EDT RP Workstation: [id number]   CT ANGIO GI BLEED Result Date: 12/04/2023 EXAM: CTA ABDOMEN AND PELVIS WITH CONTRAST 12/04/2023 09:59:01 AM TECHNIQUE: CTA images of the abdomen and pelvis with intravenous contrast. Three-dimensional MIP/volume rendered formations were performed. Automated exposure control, iterative reconstruction, and/or weight based adjustment of the mA/kV was utilized to reduce the radiation dose to as low as reasonably achievable. COMPARISON: CTA chest and CT abdomen and pelvis 11/25/2023. CLINICAL HISTORY: 63 year old female with abdominal pain, hematemesis, and suspected bleeding ulcers. FINDINGS: VASCULATURE: Stable aortoiliac atherosclerosis. Major arterial structures are patent and stable. Portal venous system appears patent on the delayed images. GI BLEED: No contrast extravasation identified into the stomach or duodenum, or elswhere. ABDOMEN/PELVIS: LOWER CHEST: Small but increased layering left pleural effusion with simple fluid density. Associated increased left lung base atelectasis or consolidation. No pericardial effusion. Right lung base more stable with curvilinear scarring or atelectasis. LIVER: The liver is unremarkable. GALLBLADDER AND BILE DUCTS: Chronic cholecystectomy. Chronic post cholecystectomy CBD enlargement is stable. SPLEEN: The spleen is unremarkable. PANCREAS: The pancreas is unremarkable. ADRENAL GLANDS: Bilateral adrenal glands demonstrate no acute abnormality. KIDNEYS, URETERS AND BLADDER: Symmetric renal enhancement and early contrast excretion. No stones in the kidneys or ureters. No hydronephrosis. No perinephric or periureteral stranding. Urinary bladder is  unremarkable. GI AND BOWEL: Large gastrocaudal hernia redemonstrated. Evidence of chronic fundoplication and herniated appearance of the fundoplication. No definite acute gastroduodenal inflammation. Non dilated large and small bowel loops. There is no bowel obstruction. No abnormal bowel wall thickening or distension. REPRODUCTIVE: Reproductive organs are unremarkable. PERITONEUM AND RETROPERITONEUM: No pneumoperitoneum. No free fluid identified. LYMPH NODES: No lymphadenopathy. BONES AND SOFT TISSUES: Chronic spine degeneration in the setting of advanced levoconvex scoliosis. No acute abnormality of the bones. No acute soft tissue abnormality. IMPRESSION: 1. 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 No active GI bleeding by CTA. No acute bowel inflammation identified. 2. Small but increased left pleural effusion and associated increased left lung base atelectasis or consolidation. Electronically signed by: Helayne Hurst MD 12/04/2023 10:11 AM EDT RP Workstation: [id number]    Microbiology: Results for orders placed or performed during the hospital encounter of 11/25/23  Resp panel by RT-PCR (RSV, Flu A&B, Covid) Anterior Nasal Swab     Status: None   Collection Time: 11/25/23  8:07 PM   Specimen: Anterior Nasal Swab  Result Value Ref Range Status   SARS Coronavirus 2 by RT PCR NEGATIVE NEGATIVE Final    Comment: (NOTE) SARS-CoV-2 target nucleic acids are NOT DETECTED.  The SARS-CoV-2 RNA is generally detectable in upper respiratory specimens during the acute phase of infection. The lowest concentration of SARS-CoV-2 viral copies this assay can detect is 138 copies/mL. A negative result does not preclude SARS-Cov-2 infection and should not be used as the sole basis for treatment or other patient management decisions. A negative result may occur with  improper specimen collection/handling, submission of specimen other than nasopharyngeal swab, presence of  viral mutation(s) within the areas targeted by this assay, and inadequate number of viral copies(<138 copies/mL). A negative result must be combined with clinical observations, patient history, and epidemiological information. The expected result is Negative.  Fact Sheet for Patients:  bloggercourse.com  Fact Sheet for Healthcare Providers:  seriousbroker.it  This test is no  t yet approved or cleared by the [place]  FDA and  has been authorized for detection and/or diagnosis of SARS-CoV-2 by FDA under an Emergency Use Authorization (EUA). This EUA will remain  in effect (meaning this test can be used) for the duration of the COVID-19 declaration under Section 564(b)(1) of the Act, 21 U.S.C.section 360bbb-3(b)(1), unless the authorization is terminated  or revoked sooner.       Influenza A by PCR NEGATIVE NEGATIVE Final   Influenza B by PCR NEGATIVE NEGATIVE Final    Comment: (NOTE) The Xpert Xpress SARS-CoV-2/FLU/RSV plus assay is intended as an aid in the diagnosis of influenza from Nasopharyngeal swab specimens and should not be used as a sole basis for treatment. Nasal washings and aspirates are unacceptable for Xpert Xpress SARS-CoV-2/FLU/RSV testing.  Fact Sheet for Patients: bloggercourse.com  Fact Sheet for Healthcare Providers: seriousbroker.it  This test is not yet approved or cleared by the [place]  FDA and has been authorized for detection and/or diagnosis of SARS-CoV-2 by FDA under an Emergency Use Authorization (EUA). This EUA will remain in effect (meaning this test can be used) for the duration of the COVID-19 declaration under Section 564(b)(1) of the Act, 21 U.S.C. section 360bbb-3(b)(1), unless the authorization is terminated or revoked.     Resp Syncytial Virus by PCR NEGATIVE NEGATIVE Final    Comment: (NOTE) Fact Sheet for  Patients: bloggercourse.com  Fact Sheet for Healthcare Providers: seriousbroker.it  This test is not yet approved or cleared by the [place]  FDA and has been authorized for detection and/or diagnosis of SARS-CoV-2 by FDA under an Emergency Use Authorization (EUA). This EUA will remain in effect (meaning this test can be used) for the duration of the COVID-19 declaration under Section 564(b)(1) of the Act, 21 U.S.C. section 360bbb-3(b)(1), unless the authorization is terminated or revoked.  Performed at [facility], [address], [place], [place] [address]   Blood Culture (routine x 2)     Status: None   Collection Time: 11/25/23  8:08 PM   Specimen: BLOOD  Result Value Ref Range Status   Specimen Description BLOOD LEFT ANTECUBITAL  Final   Special Requests   Final    BOTTLES DRAWN AEROBIC AND ANAEROBIC Blood Culture adequate volume   Culture   Final    NO GROWTH 5 DAYS Performed at [facility], [address], [place], [place] [address]    Report Status 11/30/2023 FINAL  Final  Blood Culture (routine x 2)     Status: None   Collection Time: 11/25/23  9:34 PM   Specimen: BLOOD  Result Value Ref Range Status   Specimen Description BLOOD RIGHT ANTECUBITAL  Final   Special Requests   Final    BOTTLES DRAWN AEROBIC AND ANAEROBIC Blood Culture adequate volume   Culture   Final    NO GROWTH 5 DAYS Performed at [facility], [address] [place], [place] [address]    Report Status 11/30/2023 FINAL  Final    Labs: CBC: Recent Labs  Lab 12/28/23 1031 12/29/23 0459 01/01/24 0427  WBC 14.5* 13.7* 9.3  HGB 10.8* 11.1* 11.3*  HCT 34.0* 34.8* 35.5*  MCV 84.6 84.3 83.1  PLT 481* 472* 702*   Basic Metabolic Panel: Recent Labs  Lab 12/28/23 1031 12/29/23 0459 01/01/24 0427  NA 141 135 140  K 2.7* 3.5 3.3*  CL 107 105 105  CO2 21* 19* 22  GLUCOSE 86 128* 105*  BUN 12 8 7*  CREATININE 0.66 0.56 0.55  CALCIUM  9.1  8.4* 9.2  MG  --  1.7 2.0  PHOS  --  2.4* 2.9   Liver Function Tests: Recent Labs  Lab 12/28/23 1031  AST 11*  ALT <5  ALKPHOS 78  BILITOT <0.2  PROT 6.3*  ALBUMIN 3.5   CBG: No results for input(s): GLUCAP in the last 168 hours.  Discharge time spent: greater than 30 minutes.  Signed: Alm Schneider, MD [facility] 01/01/2024

## 2024-01-01 NOTE — Progress Notes (Signed)
 Gastroenterology Progress Note   Referring Provider: No ref. provider found Primary Care Physician:  Patient, No Pcp Per Primary Gastroenterologist:  Toribio Fortune, MD  Patient ID: Renee Gutierrez; [id number]; [birth year]   Subjective:    Feels better today. Abdominal pain is improved. No stools since colonoscopy. Able to eat regular diet today.   Objective:   Vital signs in last 24 hours: Temp:  [97.7 F (36.5 C)-99.2 F (37.3 C)] 99.2 F (37.3 C) (11/13 0425) Pulse Rate:  [83-110] 110 (11/13 0425) Resp:  [17-20] 17 (11/12 1408) BP: (126-189)/(75-103) 147/95 (11/13 0425) SpO2:  [97 %-100 %] 98 % (11/13 0425) Last BM Date : 12/31/23 General:   Alert,  Well-developed, well-nourished, pleasant and cooperative in NAD. Looks comfortable today. Head:  Normocephalic and atraumatic. Eyes:  Sclera clear, no icterus.   Abdomen:  Soft,  nondistended.  Normal bowel sounds, without guarding, and without rebound. Mild right sided pain  Extremities:  Without clubbing, deformity or edema. Neurologic:  Alert and  oriented x4;  grossly normal neurologically. Skin:  Intact without significant lesions or rashes. Psych:  Alert and cooperative. Normal mood and affect.  Intake/Output from previous day: 11/12 0701 - 11/13 0700 In: 480 [P.O.:480] Out: -  Intake/Output this shift: No intake/output data recorded.  Lab Results: CBC Recent Labs    01/01/24 0427  WBC 9.3  HGB 11.3*  HCT 35.5*  MCV 83.1  PLT 702*   BMET Recent Labs    01/01/24 0427  NA 140  K 3.3*  CL 105  CO2 22  GLUCOSE 105*  BUN 7*  CREATININE 0.55  CALCIUM  9.2   LFTs No results for input(s): BILITOT, BILIDIR, IBILI, ALKPHOS, AST, ALT, PROT, ALBUMIN in the last 72 hours. No results for input(s): LIPASE in the last 72 hours. PT/INR No results for input(s): LABPROT, INR in the last 72 hours.       Imaging Studies: CT CHEST ABDOMEN PELVIS W CONTRAST Result Date:  12/29/2023 CLINICAL DATA:  Unintended weight loss, paraesophageal hernia, persistent chest and abdominal pain * Tracking Code: BO * EXAM: CT CHEST, ABDOMEN, AND PELVIS WITH CONTRAST TECHNIQUE: Multidetector CT imaging of the chest, abdomen and pelvis was performed following the standard protocol during bolus administration of intravenous contrast. RADIATION DOSE REDUCTION: This exam was performed according to the departmental dose-optimization program which includes automated exposure control, adjustment of the mA and/or kV according to patient size and/or use of iterative reconstruction technique. CONTRAST:  OMNIPAQUE  IOHEXOL  300 MG/ML  SOLN COMPARISON:  CT abdomen pelvis, 12/23/2023, CT chest angiogram, 11/25/2023 FINDINGS: CT CHEST FINDINGS Cardiovascular: Aortic atherosclerosis. Normal heart size. Unchanged small pericardial effusion. Mediastinum/Nodes: No enlarged mediastinal, hilar, or axillary lymph nodes. Large hiatal hernia with intrathoracic position of the gastric fundus; appearance suggests prior fundoplication. Thyroid gland, trachea, and esophagus demonstrate no significant findings. Lungs/Pleura: Moderate left, small right pleural effusions and associated atelectasis or consolidation. Mild centrilobular emphysema. Musculoskeletal: No chest wall abnormality. No acute osseous findings. CT ABDOMEN PELVIS FINDINGS Hepatobiliary: No focal liver abnormality is seen. Status post cholecystectomy. Postoperative biliary ductal dilatation. Pancreas: Unremarkable. No pancreatic ductal dilatation or surrounding inflammatory changes. Spleen: Normal in size without significant abnormality. Adrenals/Urinary Tract: Adrenal glands are unremarkable. Kidneys are normal, without renal calculi, solid lesion, or hydronephrosis. Bladder is unremarkable. Stomach/Bowel: Stomach is within normal limits. Appendix not clearly visualized. Long segment colonic wall thickening and mucosal hyperenhancement involving multiple  segments of colon from the cecum to the rectum, with multiple interposed segments of  sparing. Vascular/Lymphatic: Aortic atherosclerosis. No enlarged abdominal or pelvic lymph nodes. Reproductive: Hysterectomy. Other: No abdominal wall hernia or abnormality. No ascites. Musculoskeletal: No acute osseous findings. IMPRESSION: 1. Long segment colonic wall thickening and mucosal hyperenhancement involving multiple segments of colon from the cecum to the rectum, with multiple interposed segments of sparing. Findings are consistent with nonspecific infectious, inflammatory, or ischemic colitis, appearance of skip lesions suggesting Crohn's colitis. 2. Moderate left, small right pleural effusions and associated atelectasis or consolidation. 3. Large hiatal hernia with intrathoracic position of the gastric fundus; appearance suggests prior fundoplication. 4. Emphysema. 5. Cholecystectomy. Aortic Atherosclerosis (ICD10-I70.0) and Emphysema (ICD10-J43.9). Electronically Signed   By: Marolyn JONETTA Jaksch M.D.   On: 12/29/2023 16:13   DG Abd 2 Views Result Date: 12/28/2023 CLINICAL DATA:  855384 Pain [id number] EXAM: ABDOMEN - 2 VIEW COMPARISON:  Correct over sixteenth 2025, December 01, 2023. FINDINGS: Air and stool-filled nondilated loops of bowel. Mildly edematous appearance of the walls of loops of small bowel. Mild colonic stool burden diffusely throughout the colon. Visualized lung bases are unremarkable. Favored hiatal hernia. Degenerative changes of the lumbar spine. Status post cholecystectomy. Levoscoliosis of the lumbar spine IMPRESSION: 1. Nonobstructive bowel gas pattern. 2. Mildly edematous appearance of the walls of loops of small bowel. This is nonspecific but could reflect enteritis. Electronically Signed   By: Corean Salter M.D.   On: 12/28/2023 10:46   DG Chest 2 View Result Date: 12/28/2023 CLINICAL DATA:  Chest pain EXAM: DG CHEST 2V COMPARISON:  December 04, 2023 FINDINGS: The cardiomediastinal silhouette  is unchanged in contour.Rounded density over the midline likely reflecting a large hiatal hernia. Atherosclerotic calcifications of the aorta. No pleural effusion. No pneumothorax. No acute pleuroparenchymal abnormality. Lucency projecting anterior to the the xiphoid process on lateral radiograph could reflect summation artifact versus a ventral hernia. Multilevel degenerative changes of the thoracic spine. IMPRESSION: 1. No acute cardiopulmonary abnormality. Favored underlying large hiatal hernia 2. Lucency projecting anterior to the xiphoid process on lateral radiograph could reflect summation artifact versus a ventral hernia. Recommend correlation with physical exam. Electronically Signed   By: Corean Salter M.D.   On: 12/28/2023 10:43   CT ABDOMEN PELVIS W CONTRAST Result Date: 12/23/2023 EXAM: CT ABDOMEN AND PELVIS WITH CONTRAST 12/23/2023 02:41:11 PM TECHNIQUE: CT of the abdomen and pelvis was performed with the administration of 100 mL of iohexol  (OMNIPAQUE ) 300 MG/ML solution. Multiplanar reformatted images are provided for review. Automated exposure control, iterative reconstruction, and/or weight-based adjustment of the mA/kV was utilized to reduce the radiation dose to as low as reasonably achievable. COMPARISON: CT Abd-pelv W/ 11/25/2023 and 12/04/2023. CLINICAL HISTORY: Upper abdominal pain with intractable nausea and vomiting. FINDINGS: LOWER CHEST: Small left pleural effusion. Left lower lobe subsegmental atelectasis. Small pericardial effusion is similar to 12/04/2023. There is adjacent free fluid in the posterior mediastinum. LIVER: The liver is unremarkable. GALLBLADDER AND BILE DUCTS: Cholecystectomy. No biliary ductal dilatation. SPLEEN: No acute abnormality. PANCREAS: No acute abnormality. ADRENAL GLANDS: Stable adrenal glands. KIDNEYS, URETERS AND BLADDER: No stones in the kidneys or ureters. No hydronephrosis. No perinephric or periureteral stranding. Urinary bladder is unremarkable.  GI AND BOWEL: Postoperative change of Nissen fundoplication. Moderate hiatal hernia containing the fundoplication. Wall thickening about the distal esophagus is partially visualized and may in part be postoperative however esophagitis is not excluded. Stomach demonstrates no acute abnormality. There is no bowel obstruction. Normal appendix. PERITONEUM AND RETROPERITONEUM: No ascites. No free air. VASCULATURE: Aorta is normal in caliber. Aortic atherosclerotic calcification.  LYMPH NODES: No lymphadenopathy. REPRODUCTIVE ORGANS: No acute abnormality. BONES AND SOFT TISSUES: No acute osseous abnormality. No focal soft tissue abnormality. IMPRESSION: 1. Postoperative change of Nissen fundoplication with moderate hiatal hernia containing the fundoplication; distal esophageal wall thickening may be postoperative, however esophagitis is not excluded. Adjacent free fluid in the posterior mediastinum. Consider CT of the chest with IV contrast and 100 ml oral contrast administered a few minutes prior to the exam to better evaluate the hernia and fundoplication. 2. Small left pleural effusion with left lower lobe subsegmental atelectasis, decreased from 10 / 16 / 25. 3. Small pericardial effusion, similar to 12/04/23. Electronically signed by: Norman Gatlin MD 12/23/2023 07:35 PM EST RP Workstation: [id number]   DG Chest 2 View Result Date: 12/21/2023 EXAM: 2 VIEW(S) XRAY OF THE CHEST 12/21/2023 02:47:01 PM COMPARISON: Comparison 9 days ago. CLINICAL HISTORY: chest pain chest pain FINDINGS: LUNGS AND PLEURA: Increased left basilar opacity is noted concerning for worsening pneumonia or atelectasis with associated effusion. Minimal right pleural effusion is noted. No pulmonary edema. No pneumothorax. HEART AND MEDIASTINUM: No acute abnormality of the cardiac and mediastinal silhouettes. BONES AND SOFT TISSUES: No acute osseous abnormality. IMPRESSION: 1. Worsening left basilar opacity with associated effusion, favoring  pneumonia versus atelectasis. 2. Minimal right pleural effusion. Electronically signed by: Lynwood Seip MD 12/21/2023 02:49 PM EST RP Workstation: [id number]   ECHOCARDIOGRAM COMPLETE Result Date: 12/12/2023    ECHOCARDIOGRAM REPORT   Patient Name:   HELAYNA DUN Seitzinger Date of Exam: 12/12/2023 Medical Rec #:  [id number]         Height:       59.0 in Accession #:    [id number]        Weight:       103.0 lb Date of Birth:  [birth year]          BSA:          1.391 m Patient Age:    63 years          BP:           139/83 mmHg Patient Gender: F                 HR:           107 bpm. Exam Location:  [facility] Procedure: 2D Echo, Strain Analysis, Cardiac Doppler and Color Doppler (Both            Spectral and Color Flow Doppler were utilized during procedure). Indications:    CHF- Acute Diastolic I50.31  History:        Patient has no prior history of Echocardiogram examinations.                 Signs/Symptoms:Chest Pain; Risk Factors:Hypertension,                 Pre-diabetes, Dyslipidemia and Current Smoker.  Sonographer:    Koleen Popper RDCS Referring Phys: [phone] CARLOS MADERA  Sonographer Comments: Image acquisition challenging due to respiratory motion. Global longitudinal strain was attempted. IMPRESSIONS  1. Left ventricular ejection fraction, by estimation, is >75%. The left ventricle has hyperdynamic function. The left ventricle has no regional wall motion abnormalities. Left ventricular diastolic parameters are consistent with Grade I diastolic dysfunction (impaired relaxation). The average left ventricular global longitudinal strain is -18.9 %. The global longitudinal strain is normal.  2. Right ventricular systolic function is normal. The right ventricular size is normal. Tricuspid regurgitation signal is inadequate for assessing PA pressure.  3. Moderate  pericardial effusion is anterior to the right ventricle. Small pericardial effusion is lateral to LV and localized along RA. There is no evidence of cardiac  tamponade.  4. The mitral valve is normal in structure. No evidence of mitral valve regurgitation. No evidence of mitral stenosis.  5. The aortic valve is tricuspid. Aortic valve regurgitation is not visualized. No aortic stenosis is present.  6. The inferior vena cava is normal in size with greater than 50% respiratory variability, suggesting right atrial pressure of 3 mmHg. Comparison(s): No prior Echocardiogram. FINDINGS  Left Ventricle: Left ventricular ejection fraction, by estimation, is >75%. The left ventricle has hyperdynamic function. The left ventricle has no regional wall motion abnormalities. The average left ventricular global longitudinal strain is -18.9 %. Strain was performed and the global longitudinal strain is normal. The left ventricular internal cavity size was normal in size. There is no left ventricular hypertrophy. Left ventricular diastolic parameters are consistent with Grade I diastolic dysfunction (impaired relaxation). Normal left ventricular filling pressure. Right Ventricle: The right ventricular size is normal. No increase in right ventricular wall thickness. Right ventricular systolic function is normal. Tricuspid regurgitation signal is inadequate for assessing PA pressure. Left [facility]: Left atrial size was normal in size. Right [facility]: Right atrial size was normal in size. Pericardium: A moderately sized pericardial effusion is present. The pericardial effusion is anterior to the right ventricle. There is no evidence of cardiac tamponade. Mitral Valve: The mitral valve is normal in structure. No evidence of mitral valve regurgitation. No evidence of mitral valve stenosis. Tricuspid Valve: The tricuspid valve is normal in structure. Tricuspid valve regurgitation is not demonstrated. No evidence of tricuspid stenosis. Aortic Valve: The aortic valve is tricuspid. Aortic valve regurgitation is not visualized. No aortic stenosis is present. Pulmonic Valve: The pulmonic valve was  grossly normal. Pulmonic valve regurgitation is not visualized. No evidence of pulmonic stenosis. Aorta: The aortic root and ascending aorta are structurally normal, with no evidence of dilitation. Venous: The inferior vena cava is normal in size with greater than 50% respiratory variability, suggesting right atrial pressure of 3 mmHg. IAS/Shunts: No atrial level shunt detected by color flow Doppler. Additional Comments: 3D was performed not requiring image post processing on an independent workstation and was indeterminate.  LEFT VENTRICLE PLAX 2D LVIDd:         3.40 cm   Diastology LVIDs:         2.00 cm   LV e' medial:    5.44 cm/s LV PW:         1.10 cm   LV E/e' medial:  12.9 LV IVS:        1.10 cm   LV e' lateral:   9.25 cm/s LVOT diam:     1.80 cm   LV E/e' lateral: 7.6 LV SV:         47 LV SV Index:   34        2D Longitudinal Strain LVOT Area:     2.54 cm  2D Strain GLS Avg:     -18.9 %  RIGHT VENTRICLE             IVC RV Basal diam:  2.60 cm     IVC diam: 1.00 cm RV S prime:     20.00 cm/s TAPSE (M-mode): 2.2 cm LEFT ATRIUM             Index        RIGHT ATRIUM  Index LA diam:        2.70 cm 1.94 cm/m   RA Area:     8.90 cm LA Vol (A2C):   28.1 ml 20.20 ml/m  RA Volume:   14.30 ml 10.28 ml/m LA Vol (A4C):   32.5 ml 23.36 ml/m LA Biplane Vol: 31.7 ml 22.79 ml/m  AORTIC VALVE LVOT Vmax:   111.00 cm/s LVOT Vmean:  72.800 cm/s LVOT VTI:    0.184 m  AORTA Ao Root diam: 3.00 cm Ao Asc diam:  2.90 cm MITRAL VALVE MV Area (PHT): 5.46 cm     SHUNTS MV Decel Time: 139 msec     Systemic VTI:  0.18 m MV E velocity: 70.20 cm/s   Systemic Diam: 1.80 cm MV A velocity: 112.00 cm/s MV E/A ratio:  0.63 Vishnu Priya Mallipeddi Electronically signed by Diannah Late Mallipeddi Signature Date/Time: 12/12/2023/4:59:17 PM    Final    DG Chest Port 1 View Result Date: 12/12/2023 EXAM: 1 VIEW(S) XRAY OF THE CHEST 12/12/2023 04:42:00 AM COMPARISON: 12/01/2023 CLINICAL HISTORY: CP, HTN, leg swelling. CP, HTN,  leg/foot swelling and pain, elevated BP @ time of imaging (203/93), painful stomach palpitations. FINDINGS: LUNGS AND PLEURA: Left base atelectasis. Mild right basilar atelectasis or scarring. No pulmonary edema. No pleural effusion. Skin fold artifact right chest. No pneumothorax. HEART AND MEDIASTINUM: Aortic calcification. Moderate chronic hiatal hernia. BONES AND SOFT TISSUES: Cervical spine surgical hardware noted. No acute osseous abnormality. IMPRESSION: 1. No acute cardiopulmonary abnormality ;minor lung base atelectasis. 2. Moderate chronic hiatal hernia. Electronically signed by: Helayne Hurst MD 12/12/2023 05:58 AM EDT RP Workstation: [id number]   CT ANGIO GI BLEED Result Date: 12/04/2023 EXAM: CTA ABDOMEN AND PELVIS WITH CONTRAST 12/04/2023 09:59:01 AM TECHNIQUE: CTA images of the abdomen and pelvis with intravenous contrast. Three-dimensional MIP/volume rendered formations were performed. Automated exposure control, iterative reconstruction, and/or weight based adjustment of the mA/kV was utilized to reduce the radiation dose to as low as reasonably achievable. COMPARISON: CTA chest and CT abdomen and pelvis 11/25/2023. CLINICAL HISTORY: 63 year old female with abdominal pain, hematemesis, and suspected bleeding ulcers. FINDINGS: VASCULATURE: Stable aortoiliac atherosclerosis. Major arterial structures are patent and stable. Portal venous system appears patent on the delayed images. GI BLEED: No contrast extravasation identified into the stomach or duodenum, or elswhere. ABDOMEN/PELVIS: LOWER CHEST: Small but increased layering left pleural effusion with simple fluid density. Associated increased left lung base atelectasis or consolidation. No pericardial effusion. Right lung base more stable with curvilinear scarring or atelectasis. LIVER: The liver is unremarkable. GALLBLADDER AND BILE DUCTS: Chronic cholecystectomy. Chronic post cholecystectomy CBD enlargement is stable. SPLEEN: The spleen is  unremarkable. PANCREAS: The pancreas is unremarkable. ADRENAL GLANDS: Bilateral adrenal glands demonstrate no acute abnormality. KIDNEYS, URETERS AND BLADDER: Symmetric renal enhancement and early contrast excretion. No stones in the kidneys or ureters. No hydronephrosis. No perinephric or periureteral stranding. Urinary bladder is unremarkable. GI AND BOWEL: Large gastrocaudal hernia redemonstrated. Evidence of chronic fundoplication and herniated appearance of the fundoplication. No definite acute gastroduodenal inflammation. Non dilated large and small bowel loops. There is no bowel obstruction. No abnormal bowel wall thickening or distension. REPRODUCTIVE: Reproductive organs are unremarkable. PERITONEUM AND RETROPERITONEUM: No pneumoperitoneum. No free fluid identified. LYMPH NODES: No lymphadenopathy. BONES AND SOFT TISSUES: Chronic spine degeneration in the setting of advanced levoconvex scoliosis. No acute abnormality of the bones. No acute soft tissue abnormality. IMPRESSION: 1. 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 No active GI bleeding by CTA. No  acute bowel inflammation identified. 2. Small but increased left pleural effusion and associated increased left lung base atelectasis or consolidation. 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 GI consulted for further evaluation.   Hematemesis/melena/rectal bleeding: -reported prior to admission, none since saturday -endorses black stools on Sunday, BRB in stools on Saturday night, none since admission  -hgb stable at 11.3 (11.1 yesterday) -known large ulcer in cardia located in hernia sac, non bleeding visible vessel s/p bipolar cautery -will need to repeat EGD if overt bleeding or declining hemoglobin    Chronic constipation/epigastric/abdominal pain: -Known paraesophageal hernia, worsened discomfort with eating -Does not appear cardiac related, trops negative -Imaging last week on CT with moderate hiatal hernia containing fundoplication, distal esophageal wall thickening, adjacent free fluid in posterior mediastinum  -CT chest/abdomen/pelvis performed 11/11: Large hiatal hernia with intrathoracic position of the gastric fundus; appearance suggests prior fundoplication. Long segment colonic wall thickening and mucosal hyperenhancement involving multiple segments of colon from the cecum to the rectum, with multiple interposed segments of sparing. Findings with nonspecific infectious, inflammatory, or ischemic colitis, appearance of skip lesions suggesting Crohn's colitis. -Abd xray on 11/9 with mildly edematous appearance of walls of small bowel, could reflect enteritis, mild colonic stool burden throughout colon -notably she had findings of rectosigmoid colonic wall thickening on CT A/P done 10/7 as well.  -endorses chronic constipation for many years -no improvement with linzess in the past -Started on miralax BID yesterday -decreased appetite and weight loss (approx 11 pounds over the past month) -worsening abdominal pain postprandially, about 10-15 minutes after eating with fecal urgency/looser stools for the past month or two.   Etiology may be  multifactorial in setting of findings on CT yesterday. she does endorse looser to watery stools with fecal urgency for the past month or so. No recent antibiotics prior to onset. Low suspicion for infectious etiology.  Unclear if large hiatal hernia with recent findings on EGD of ulcer in cardia is contributing to her pain.       Colonoscopy with 6mm polyp ascending oclon, minimall fibrotic appearing ascending segment s/p biopsy. Normal appearing TI. ?self-limited colitis.   Plan:   Continue PPI BID. Keep referral to [facility] regarding hiatal hernia EGD as outpatient for follow up gastric ulcer in 02/2024 Follow up pending biopsies.    LOS: 3 days   Sonny RAMAN. Ezzard RIGGERS [facility] [phone] 11/13/202511:54 AM

## 2024-01-02 NOTE — Anesthesia Postprocedure Evaluation (Signed)
 Anesthesia Post Note  Patient: Renee Gutierrez  Procedure(s) Performed: COLONOSCOPY  Anesthesia Type: MAC Anesthetic complications: no   No notable events documented.   Last Vitals:  Vitals:   01/01/24 1344 01/01/24 1345  BP: 130/74   Pulse: 91   Resp:  15  Temp: 36.8 C   SpO2: 97%     Last Pain:  Vitals:   01/01/24 1350  TempSrc:   PainSc: Asleep                 [name]

## 2024-01-03 ENCOUNTER — Inpatient Hospital Stay (HOSPITAL_COMMUNITY)
Admission: EM | Admit: 2024-01-03 | Discharge: 2024-01-06 | DRG: 384 | Disposition: A | Discharge status: Home / Self Care | Attending: Emergency Medicine | Admitting: Emergency Medicine

## 2024-01-03 ENCOUNTER — Emergency Department (HOSPITAL_COMMUNITY)

## 2024-01-03 DIAGNOSIS — D75839 Thrombocytosis, unspecified: Secondary | ICD-10-CM | POA: Diagnosis present

## 2024-01-03 DIAGNOSIS — K2210 Ulcer of esophagus without bleeding: Secondary | ICD-10-CM | POA: Diagnosis present

## 2024-01-03 DIAGNOSIS — Z888 Allergy status to other drugs, medicaments and biological substances status: Secondary | ICD-10-CM

## 2024-01-03 DIAGNOSIS — I10 Essential (primary) hypertension: Secondary | ICD-10-CM | POA: Diagnosis present

## 2024-01-03 DIAGNOSIS — Z79899 Other long term (current) drug therapy: Secondary | ICD-10-CM | POA: Diagnosis not present

## 2024-01-03 DIAGNOSIS — K449 Diaphragmatic hernia without obstruction or gangrene: Secondary | ICD-10-CM | POA: Diagnosis present

## 2024-01-03 DIAGNOSIS — K2100 Gastro-esophageal reflux disease with esophagitis, without bleeding: Secondary | ICD-10-CM | POA: Diagnosis not present

## 2024-01-03 DIAGNOSIS — I3139 Other pericardial effusion (noninflammatory): Secondary | ICD-10-CM | POA: Diagnosis present

## 2024-01-03 DIAGNOSIS — F411 Generalized anxiety disorder: Secondary | ICD-10-CM | POA: Diagnosis present

## 2024-01-03 DIAGNOSIS — E785 Hyperlipidemia, unspecified: Secondary | ICD-10-CM | POA: Diagnosis present

## 2024-01-03 DIAGNOSIS — M797 Fibromyalgia: Secondary | ICD-10-CM | POA: Diagnosis present

## 2024-01-03 DIAGNOSIS — Z9049 Acquired absence of other specified parts of digestive tract: Secondary | ICD-10-CM

## 2024-01-03 DIAGNOSIS — R079 Chest pain, unspecified: Secondary | ICD-10-CM

## 2024-01-03 DIAGNOSIS — Z885 Allergy status to narcotic agent status: Secondary | ICD-10-CM

## 2024-01-03 DIAGNOSIS — Z886 Allergy status to analgesic agent status: Secondary | ICD-10-CM | POA: Diagnosis not present

## 2024-01-03 DIAGNOSIS — F17210 Nicotine dependence, cigarettes, uncomplicated: Secondary | ICD-10-CM | POA: Diagnosis present

## 2024-01-03 DIAGNOSIS — G894 Chronic pain syndrome: Secondary | ICD-10-CM | POA: Diagnosis present

## 2024-01-03 DIAGNOSIS — K635 Polyp of colon: Secondary | ICD-10-CM | POA: Diagnosis present

## 2024-01-03 DIAGNOSIS — R1013 Epigastric pain: Secondary | ICD-10-CM | POA: Diagnosis not present

## 2024-01-03 DIAGNOSIS — K279 Peptic ulcer, site unspecified, unspecified as acute or chronic, without hemorrhage or perforation: Secondary | ICD-10-CM | POA: Diagnosis present

## 2024-01-03 DIAGNOSIS — K219 Gastro-esophageal reflux disease without esophagitis: Secondary | ICD-10-CM | POA: Diagnosis present

## 2024-01-03 DIAGNOSIS — R1010 Upper abdominal pain, unspecified: Secondary | ICD-10-CM | POA: Diagnosis present

## 2024-01-03 DIAGNOSIS — D72829 Elevated white blood cell count, unspecified: Secondary | ICD-10-CM

## 2024-01-03 DIAGNOSIS — I5033 Acute on chronic diastolic (congestive) heart failure: Secondary | ICD-10-CM | POA: Diagnosis not present

## 2024-01-03 DIAGNOSIS — Z881 Allergy status to other antibiotic agents status: Secondary | ICD-10-CM

## 2024-01-03 DIAGNOSIS — K259 Gastric ulcer, unspecified as acute or chronic, without hemorrhage or perforation: Secondary | ICD-10-CM | POA: Diagnosis present

## 2024-01-03 DIAGNOSIS — K589 Irritable bowel syndrome without diarrhea: Secondary | ICD-10-CM | POA: Diagnosis present

## 2024-01-03 DIAGNOSIS — I130 Hypertensive heart and chronic kidney disease with heart failure and stage 1 through stage 4 chronic kidney disease, or unspecified chronic kidney disease: Secondary | ICD-10-CM | POA: Diagnosis not present

## 2024-01-03 DIAGNOSIS — Z90710 Acquired absence of both cervix and uterus: Secondary | ICD-10-CM | POA: Diagnosis not present

## 2024-01-03 DIAGNOSIS — G2581 Restless legs syndrome: Secondary | ICD-10-CM | POA: Diagnosis present

## 2024-01-03 DIAGNOSIS — R112 Nausea with vomiting, unspecified: Principal | ICD-10-CM | POA: Diagnosis present

## 2024-01-03 DIAGNOSIS — E876 Hypokalemia: Secondary | ICD-10-CM | POA: Diagnosis present

## 2024-01-03 DIAGNOSIS — N179 Acute kidney failure, unspecified: Secondary | ICD-10-CM | POA: Diagnosis present

## 2024-01-03 DIAGNOSIS — Z882 Allergy status to sulfonamides status: Secondary | ICD-10-CM | POA: Diagnosis not present

## 2024-01-03 DIAGNOSIS — N182 Chronic kidney disease, stage 2 (mild): Secondary | ICD-10-CM | POA: Diagnosis not present

## 2024-01-03 LAB — BASIC METABOLIC PANEL WITH GFR
Anion gap: 17 — ABNORMAL HIGH (ref 5–15)
BUN: 18 mg/dL (ref 8–23)
CO2: 21 mmol/L — ABNORMAL LOW (ref 22–32)
Calcium: 10.2 mg/dL (ref 8.9–10.3)
Chloride: 103 mmol/L (ref 98–111)
Creatinine, Ser: 1.34 mg/dL — ABNORMAL HIGH (ref 0.44–1.00)
GFR, Estimated: 44 mL/min — ABNORMAL LOW (ref >60)
Glucose, Bld: 119 mg/dL — ABNORMAL HIGH (ref 70–99)
Potassium: 2.9 mmol/L — ABNORMAL LOW (ref 3.5–5.1)
Sodium: 141 mmol/L (ref 135–145)

## 2024-01-03 LAB — CBC
HCT: 42.8 % (ref 36.0–46.0)
Hemoglobin: 14 g/dL (ref 12.0–15.0)
MCH: 26.6 pg (ref 26.0–34.0)
MCHC: 32.7 g/dL (ref 30.0–36.0)
MCV: 81.2 fL (ref 80.0–100.0)
Platelets: 893 K/uL — ABNORMAL HIGH (ref 150–400)
RBC: 5.27 MIL/uL — ABNORMAL HIGH (ref 3.87–5.11)
RDW: 15.4 % (ref 11.5–15.5)
WBC: 15.8 K/uL — ABNORMAL HIGH (ref 4.0–10.5)
nRBC: 0 % (ref 0.0–0.2)

## 2024-01-03 LAB — HEPATIC FUNCTION PANEL
ALT: <5 U/L (ref 0–44)
AST: 16 U/L (ref 15–41)
Albumin: 4.2 g/dL (ref 3.5–5.0)
Alkaline Phosphatase: 88 U/L (ref 38–126)
Bilirubin, Direct: <0.1 mg/dL (ref 0.0–0.2)
Total Bilirubin: 0.2 mg/dL (ref 0.0–1.2)
Total Protein: 7.5 g/dL (ref 6.5–8.1)

## 2024-01-03 LAB — LIPASE, BLOOD: Lipase: 17 U/L (ref 11–51)

## 2024-01-03 LAB — I-STAT CHEM 8, ED
BUN: 19 mg/dL (ref 8–23)
Calcium, Ion: 1.17 mmol/L (ref 1.15–1.40)
Chloride: 105 mmol/L (ref 98–111)
Creatinine, Ser: 1.3 mg/dL — ABNORMAL HIGH (ref 0.44–1.00)
Glucose, Bld: 121 mg/dL — ABNORMAL HIGH (ref 70–99)
HCT: 43 % (ref 36.0–46.0)
Hemoglobin: 14.6 g/dL (ref 12.0–15.0)
Potassium: 2.9 mmol/L — ABNORMAL LOW (ref 3.5–5.1)
Sodium: 140 mmol/L (ref 135–145)
TCO2: 21 mmol/L — ABNORMAL LOW (ref 22–32)

## 2024-01-03 LAB — MAGNESIUM: Magnesium: 2.2 mg/dL (ref 1.7–2.4)

## 2024-01-03 LAB — TROPONIN T, HIGH SENSITIVITY
Troponin T High Sensitivity: 25 ng/L — ABNORMAL HIGH (ref 0–19)
Troponin T High Sensitivity: 21 ng/L — ABNORMAL HIGH (ref 0–19)

## 2024-01-03 MED ORDER — HYDROMORPHONE HCL 1 MG/ML IJ SOLN
1.0000 mg | Freq: Once | INTRAMUSCULAR | Status: AC
  Administered 2024-01-03: 1 mg via INTRAVENOUS
  Filled 2024-01-03: qty 1

## 2024-01-03 MED ORDER — LACTATED RINGERS IV SOLN: INTRAVENOUS | Status: AC

## 2024-01-03 MED ORDER — ACETAMINOPHEN 325 MG PO TABS: 650.0000 mg | ORAL_TABLET | Freq: Four times a day (QID) | ORAL | Status: DC | PRN

## 2024-01-03 MED ORDER — HYDROMORPHONE HCL 1 MG/ML IJ SOLN
0.5000 mg | INTRAMUSCULAR | Status: DC | PRN
Start: 2024-01-03 — End: 2024-01-06
  Administered 2024-01-03 – 2024-01-06 (×15): 0.5 mg via INTRAVENOUS
  Filled 2024-01-03 (×16): qty 0.5

## 2024-01-03 MED ORDER — SODIUM CHLORIDE 0.9 % IV BOLUS
500.0000 mL | Freq: Once | INTRAVENOUS | Status: AC
  Administered 2024-01-03: 500 mL via INTRAVENOUS

## 2024-01-03 MED ORDER — ONDANSETRON HCL 4 MG/2ML IJ SOLN
4.0000 mg | Freq: Four times a day (QID) | INTRAMUSCULAR | Status: DC | PRN
  Administered 2024-01-03 – 2024-01-06 (×4): 4 mg via INTRAVENOUS
  Filled 2024-01-03 (×4): qty 2

## 2024-01-03 MED ORDER — PANTOPRAZOLE SODIUM 40 MG IV SOLR
40.0000 mg | Freq: Two times a day (BID) | INTRAVENOUS | Status: DC
  Administered 2024-01-03 – 2024-01-06 (×6): 40 mg via INTRAVENOUS
  Filled 2024-01-03 (×6): qty 10

## 2024-01-03 MED ORDER — ENOXAPARIN SODIUM 30 MG/0.3ML IJ SOSY
30.0000 mg | PREFILLED_SYRINGE | INTRAMUSCULAR | Status: DC
  Administered 2024-01-03 – 2024-01-05 (×3): 30 mg via SUBCUTANEOUS
  Filled 2024-01-03 (×3): qty 0.3

## 2024-01-03 MED ORDER — LORAZEPAM 2 MG/ML IJ SOLN
0.5000 mg | Freq: Once | INTRAMUSCULAR | Status: AC
  Administered 2024-01-03: 0.5 mg via INTRAVENOUS
  Filled 2024-01-03: qty 1

## 2024-01-03 MED ORDER — POTASSIUM CHLORIDE 10 MEQ/100ML IV SOLN
10.0000 meq | Freq: Once | INTRAVENOUS | Status: AC
  Administered 2024-01-03: 10 meq via INTRAVENOUS
  Filled 2024-01-03: qty 100

## 2024-01-03 MED ORDER — IOHEXOL 300 MG/ML  SOLN
75.0000 mL | Freq: Once | INTRAMUSCULAR | Status: AC | PRN
  Administered 2024-01-03: 75 mL via INTRAVENOUS

## 2024-01-03 MED ORDER — HYDROMORPHONE HCL 1 MG/ML IJ SOLN
0.5000 mg | Freq: Once | INTRAMUSCULAR | Status: AC
  Administered 2024-01-03: 0.5 mg via INTRAVENOUS
  Filled 2024-01-03: qty 0.5

## 2024-01-03 MED ORDER — ONDANSETRON HCL 4 MG/2ML IJ SOLN
4.0000 mg | Freq: Once | INTRAMUSCULAR | Status: AC
  Administered 2024-01-03: 4 mg via INTRAVENOUS
  Filled 2024-01-03: qty 2

## 2024-01-03 MED ORDER — ACETAMINOPHEN 650 MG RE SUPP: 650.0000 mg | Freq: Four times a day (QID) | RECTAL | Status: DC | PRN

## 2024-01-03 MED ORDER — ONDANSETRON HCL 4 MG PO TABS: 4.0000 mg | ORAL_TABLET | Freq: Four times a day (QID) | ORAL | Status: DC | PRN

## 2024-01-03 MED ORDER — PANTOPRAZOLE SODIUM 40 MG IV SOLR
40.0000 mg | Freq: Once | INTRAVENOUS | Status: AC
  Administered 2024-01-03: 40 mg via INTRAVENOUS
  Filled 2024-01-03: qty 10

## 2024-01-03 NOTE — ED Provider Notes (Signed)
 [place] EMERGENCY DEPARTMENT AT [facility] Provider Note   CSN: [id number] Arrival date & time: 01/03/24  1250     Patient presents with: Chest Pain   Renee Gutierrez is a 63 y.o. female.  {Add pertinent medical, surgical, social history, OB history to YEP:67052} Patient complains of vomiting and epigastric abdominal pain.  Patient has a history of gastric ulcers and significant hiatal hernia with a Niesen fundoplication done 20 years ago  The history is provided by the patient and medical records. No language interpreter was used.  Chest Pain Pain location:  Epigastric Pain quality: aching   Pain radiates to:  Does not radiate Pain severity:  Moderate Onset quality:  Sudden Timing:  Constant Progression:  Worsening Chronicity:  Recurrent Context: not breathing   Relieved by:  Nothing Associated symptoms: abdominal pain   Associated symptoms: no back pain, no cough, no fatigue and no headache        Prior to Admission medications   Medication Sig Start Date End Date Taking? Authorizing Provider  amLODipine  (NORVASC ) 10 MG tablet Take 1 tablet (10 mg total) by mouth daily. 12/15/23   Arrien, Mauricio Daniel, MD  DULoxetine  (CYMBALTA ) 60 MG capsule Take 60 mg by mouth 2 (two) times daily. 05/16/20   [provider]  HYDROmorphone  (DILAUDID ) 4 MG tablet Take 4 mg by mouth every 8 (eight) hours as needed for severe pain (pain score 7-10).    [provider]  labetalol  (NORMODYNE ) 300 MG tablet Take 1 tablet (300 mg total) by mouth 2 (two) times daily. 01/01/24   Evonnie Lenis, MD  lisinopril  (ZESTRIL ) 20 MG tablet Take 1 tablet (20 mg total) by mouth daily. 01/02/24   Evonnie Lenis, MD  morphine  (MS CONTIN ) 15 MG 12 hr tablet Take 15 mg by mouth every 12 (twelve) hours.    [provider]  morphine  (MSIR) 15 MG tablet Take 15 mg by mouth every 4 (four) hours as needed for severe pain (pain score 7-10).    [provider]   ondansetron  (ZOFRAN -ODT) 8 MG disintegrating tablet Take 1 tablet (8 mg total) by mouth every 6 (six) hours as needed for nausea or vomiting. 11/29/23   Darci Pore, MD  pantoprazole  (PROTONIX ) 40 MG tablet Take 1 tablet (40 mg total) by mouth 2 (two) times daily. 11/29/23   Darci Pore, MD  phenytoin  (DILANTIN ) 100 MG ER capsule Take 100 mg by mouth. Patient taking differently: Take 100 mg by mouth 2 (two) times daily. 12/09/23   [provider]  prednisoLONE  acetate (PRED FORTE ) 1 % ophthalmic suspension Place 1 drop into the left eye in the morning and at bedtime. 12/14/23   Arrien, Mauricio Daniel, MD  pregabalin  (LYRICA ) 50 MG capsule Take 50 mg by mouth See admin instructions. Take 1 capsule by mouth every night at bedtime for 1 week then 1 capsule twice daily for 1 week then 1 capsule three times daily.    [provider]  prochlorperazine  (COMPAZINE ) 10 MG tablet Take 1 tablet (10 mg total) by mouth every 6 (six) hours as needed for nausea or vomiting. 12/24/23   Bryn Bernardino NOVAK, MD  promethazine  (PHENERGAN ) 12.5 MG tablet Take 12.5 mg by mouth every 12 (twelve) hours as needed for nausea.    [provider]  rOPINIRole  (REQUIP ) 2 MG tablet Take 4 mg by mouth See admin instructions. Take 1 tablet in the morning and 2 tablet every evening. 11/11/14   [provider]  simethicone (  GAS RELIEF EXTRA STRENGTH) 125 MG chewable tablet Chew 125 mg by mouth daily as needed for flatulence.  05/24/14   [provider]  spironolactone (ALDACTONE) 25 MG tablet Take 1 tablet (25 mg total) by mouth daily. 12/14/23   Arrien, Mauricio Daniel, MD  sucralfate (CARAFATE) 1 GM/10ML suspension Take 10 mLs (1 g total) by mouth 4 (four) times daily -  with meals and at bedtime. 12/07/23   Evonnie Lenis, MD    Allergies: Buprenorphine hcl, Bupropion, Codeine, Sulfamethoxazole-trimethoprim, Nsaids, Pregabalin , Sulfa antibiotics, Tapentadol, Clindamycin,  Clindamycin/lincomycin, Cymbalta  [duloxetine  hcl], Diclofenac, Diclofenac sodium, Gabapentin, Oxycodone , and Toradol  [ketorolac  tromethamine ]    Review of Systems  Constitutional:  Negative for appetite change and fatigue.  HENT:  Negative for congestion, ear discharge and sinus pressure.   Eyes:  Negative for discharge.  Respiratory:  Negative for cough.   Cardiovascular:  Positive for chest pain.  Gastrointestinal:  Positive for abdominal pain. Negative for diarrhea.  Genitourinary:  Negative for frequency and hematuria.  Musculoskeletal:  Negative for back pain.  Skin:  Negative for rash.  Neurological:  Negative for seizures and headaches.  Psychiatric/Behavioral:  Negative for hallucinations.     Updated Vital Signs BP (!) 175/101   Pulse (!) 108   Temp 98 F (36.7 C)   Resp (!) 30   Ht 4' 11 (1.499 m)   Wt 43.1 kg   SpO2 95%   BMI 19.20 kg/m   Physical Exam Vitals and nursing note reviewed.  Constitutional:      Appearance: She is well-developed.  HENT:     Head: Normocephalic.     Nose: Nose normal.  Eyes:     General: No scleral icterus.    Conjunctiva/sclera: Conjunctivae normal.  Neck:     Thyroid: No thyromegaly.  Cardiovascular:     Rate and Rhythm: Normal rate and regular rhythm.     Heart sounds: No murmur heard.    No friction rub. No gallop.  Pulmonary:     Breath sounds: No stridor. No wheezing or rales.  Chest:     Chest wall: No tenderness.  Abdominal:     General: There is no distension.     Tenderness: There is abdominal tenderness. There is no rebound.  Musculoskeletal:        General: Normal range of motion.     Cervical back: Neck supple.  Lymphadenopathy:     Cervical: No cervical adenopathy.  Skin:    Findings: No erythema or rash.  Neurological:     Mental Status: She is alert and oriented to person, place, and time.     Motor: No abnormal muscle tone.     Coordination: Coordination normal.  Psychiatric:        Behavior:  Behavior normal.     (all labs ordered are listed, but only abnormal results are displayed) Labs Reviewed  CBC - Abnormal; Notable for the following components:      Result Value   WBC 15.8 (*)    RBC 5.27 (*)    Platelets 893 (*)    All other components within normal limits  I-STAT CHEM 8, ED - Abnormal; Notable for the following components:   Potassium 2.9 (*)    Creatinine, Ser 1.30 (*)    Glucose, Bld 121 (*)    TCO2 21 (*)    All other components within normal limits  BASIC METABOLIC PANEL WITH GFR  HEPATIC FUNCTION PANEL  LIPASE, BLOOD  TROPONIN T, HIGH SENSITIVITY  TROPONIN  T, HIGH SENSITIVITY    EKG: None  Radiology: [facility] Chest Port 1 View Result Date: 01/03/2024 CLINICAL DATA:  Shortness of breath and chest pain. EXAM: PORTABLE CHEST 1 VIEW COMPARISON:  12/28/2023 FINDINGS: Lungs are adequately inflated without effusion, focal airspace consolidation or pneumothorax. Moderate size hiatal hernia unchanged. Cardiomediastinal silhouette and remainder of the exam is unchanged. IMPRESSION: 1. No acute cardiopulmonary disease. 2. Moderate size hiatal hernia. Electronically Signed   By: Toribio Agreste M.D.   On: 01/03/2024 13:59    {Document cardiac monitor, telemetry assessment procedure when appropriate:32947} Procedures   Medications Ordered in the ED  potassium chloride  10 mEq in 100 mL IVPB (has no administration in time range)  HYDROmorphone  (DILAUDID ) injection 0.5 mg (0.5 mg Intravenous Given 01/03/24 1351)  LORazepam (ATIVAN) injection 0.5 mg (0.5 mg Intravenous Given 01/03/24 1350)  sodium chloride  0.9 % bolus 500 mL (0 mLs Intravenous Stopped 01/03/24 1515)  pantoprazole  (PROTONIX ) injection 40 mg (40 mg Intravenous Given 01/03/24 1348)  iohexol  (OMNIPAQUE ) 300 MG/ML solution 75 mL (75 mLs Intravenous Contrast Given 01/03/24 1517)      {Click here for ABCD2, HEART and other calculators REFRESH Note before signing:1}                              Medical  Decision Making Amount and/or Complexity of Data Reviewed Labs: ordered. Radiology: ordered.  Risk Prescription drug management.  Patient with abdominal pain and vomiting with history of hiatal hernia with Nissen fundoplication surgery.  Patient has gastric ulcers on upper endoscopy done last month.  Patient also has hypokalemia.  CT abdomen pending.  {Document critical care time when appropriate  Document review of labs and clinical decision tools ie CHADS2VASC2, etc  Document your independent review of radiology images and any outside records  Document your discussion with family members, caretakers and with consultants  Document social determinants of health affecting pt's care  Document your decision making why or why not admission, treatments were needed:32947:::1}   Final diagnoses:  None    ED Discharge Orders     None

## 2024-01-03 NOTE — ED Notes (Signed)
 Attempted to call report to charge nurse. Got no response.

## 2024-01-03 NOTE — ED Notes (Signed)
Provider notified of pt elevated blood pressure

## 2024-01-03 NOTE — ED Triage Notes (Signed)
 Pt comes in for CP and SOB. Pt states pain started yesterday plus started to throw up. Pt is unable to keep anything down.   A&Ox4.   Hx of CHF.  Pt was recently admitted to the hospital for same thing. D/c on tuesday.

## 2024-01-03 NOTE — Progress Notes (Signed)
 Pt admitted to Rm 305. Pt ambulated from gurney to bed. Standing weight obtained. A&Ox4. Pt c/o pain 9/10 in abdomen. Yellow MEWS for pulse of 118. BP 167/107, T 98.6, R 20.RN administered PRN pain and nausea medication when they became available. Yellow MEWS guidelines initiated.

## 2024-01-03 NOTE — ED Provider Notes (Signed)
 Signout from Dr. Zammit.  63 year old female with prior history of gastric ulcers Sun fundoplication here with persistent vomiting epigastric abdominal pain.  She is pending labs and CT abdomen and pelvis.  She has received medication for her symptoms.  Plan is to reassess and see if her symptoms are controlled well enough to be discharged. Physical Exam  BP (!) 175/101   Pulse 96   Temp 98 F (36.7 C)   Resp 20   Ht 4' 11 (1.499 m)   Wt 43.1 kg   SpO2 100%   BMI 19.20 kg/m   Physical Exam  Procedures  Procedures  ED Course / MDM    Medical Decision Making Amount and/or Complexity of Data Reviewed Labs: ordered. Radiology: ordered.  Risk Prescription drug management. Decision regarding hospitalization.   CT shows continued paraesophageal hernia.  Initial troponin 25.  Discussed with [facility] hospitalist Dr. Adefeso who will evaluate for admission.       Renee Ozell BROCKS, MD 01/04/24 [phone]

## 2024-01-03 NOTE — ED Notes (Signed)
 Attempted to call report to floor. Nurse receiving pt was busy and requested to call back.

## 2024-01-03 NOTE — H&P (Addendum)
 History and Physical    Patient: Renee Gutierrez FMW:[id number] DOB: 1960/08/16 DOA: 01/03/2024 DOS: the patient was seen and examined on 01/03/2024 PCP: Patient, No Pcp Per  Patient coming from: Home  Chief Complaint:  Chief Complaint  Patient presents with   Chest Pain   HPI: Renee Gutierrez is a 63 y.o. female with medical history significant of hypertension, GERD, PUD, IBS, fibromyalgia, RLS, CKD, trigeminal neuralgia, migraines, cerebral AVM involving brainstem, hiatal hernia s/p Nissen fundoplication (20 years ago), recurrent nausea and vomiting.  Recent multiple hospitalization who presents to the emergency department due to persistent nausea, vomiting and epigastric abdominal pain.  Patient was admitted from 11/9 to 11/13 due to intractable nausea and vomiting.  Colonoscopy done on 11/12 showed one 6 mm polyp in the ascending colon, removed with a cold snare.  Resected and retrieved.  Minimal fibrotic appearing descending segment s/p biopsy.  Patient was said to have likely experienced self-limiting colitis  Patient was also recently discharged on 11//24 with similar complaints and she had 3 admissions in October 2025 with similar symptoms.  She underwent an 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 and presence of a nonbleeding gastric ulcer measuring 2 cm and with a nonbleeding visible vessel which was ablated with bipolar probe. Rest of the examination was normal.   EGD done on 11/28/2023 showed a large ulcer in the first portion of the stomach just distal to the esophagus which was suspected to be possibly due to her use of NSAIDs.  She complained of recurrent nausea and vomiting which started yesterday (11/14) in the evening and this was associated with epigastric sharp pain and chest pain which she thought was possibly due to a reflux of stomach contents.  She denies fever, chills, shortness of breath   ED course She was  tachypneic, tachycardic, BP 135/94, other vital signs were within normal range.  Workup in the ED showed leukocytosis, thrombocytosis.  BMP shows sodium 141, potassium 2.9, chloride 103, bicarb 24, blood glucose 119, BUN 18, creatinine 1.34, calcium  10.2, eGFR 44, anion gap 17.  Troponin 25 > 21, magnesium  2.2. CT abdomen and pelvis with contrast showed recurrent t paraesophageal hernia status post prior Nissen fundoplication. Interval resolution of pleural effusions. Small pericardial effusion, slightly decreased. Chest x-ray showed no acute cardiopulmonary disease She was treated with Dilaudid , Ativan, Protonix  were provided and potassium was replenished.  IV NS 500 mL was given.   Review of Systems: As mentioned in the history of present illness. All other systems reviewed and are negative. 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   Past Surgical History:  Procedure Laterality Date   ABDOMINAL HYSTERECTOMY     ABDOMINAL SURGERY     tumor removed   APPENDECTOMY     BRAIN SURGERY     to fix AVM   CEREBRAL ANEURYSM REPAIR     CERVICAL FUSION     CESAREAN  SECTION     CHOLECYSTECTOMY     COLONOSCOPY N/A 12/31/2023   Procedure: COLONOSCOPY;  Surgeon: Shaaron Lamar HERO, MD;  Location: [facility];  Service: Endoscopy;  Laterality: N/A;   ESOPHAGEAL DILATION N/A 11/28/2023   Procedure: DILATION, ESOPHAGUS;  Surgeon: Cindie Carlin POUR, DO;  Location: [facility];  Service: Endoscopy;  Laterality: N/A;   ESOPHAGOGASTRODUODENOSCOPY N/A 11/28/2023   Procedure:  EGD (ESOPHAGOGASTRODUODENOSCOPY);  Surgeon: Cindie Carlin POUR, DO;  Location: [facility];  Service: Endoscopy;  Laterality: N/A;   ESOPHAGOGASTRODUODENOSCOPY N/A 12/05/2023   Procedure: EGD (ESOPHAGOGASTRODUODENOSCOPY);  Surgeon: Eartha Flavors, Toribio, MD;  Location: [facility];  Service: Gastroenterology;  Laterality: N/A;   LUNG SURGERY     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   Social History:  reports that she has been smoking cigarettes. She has never used smokeless tobacco. She reports that she does not drink alcohol and does not use drugs.  Allergies  Allergen Reactions   Buprenorphine Hcl Swelling    All extremities and neck and face swelled up huge according to patient   Bupropion Swelling and Other (See Comments)    Moody and depressed   Codeine Itching, Nausea And Vomiting, Nausea Only and Swelling   Sulfamethoxazole-Trimethoprim Swelling and Other (See Comments)    Swelled up her eye   Nsaids Other (See Comments)    Unknown    Pregabalin  Swelling    Lyrica     Sulfa Antibiotics Other (See Comments)    Unknown    Tapentadol Nausea And Vomiting    Pt states she can take   Clindamycin Other (See Comments)    Heart burn   Clindamycin/Lincomycin Rash and Other (See Comments)    Heart burn   Cymbalta  [Duloxetine  Hcl] Swelling   Diclofenac Swelling    Cream    Diclofenac Sodium Swelling    Oral med   Gabapentin Swelling   Oxycodone  Nausea Only    No longer has a reaction per pt   Toradol  [Ketorolac  Tromethamine ] Rash    Family History  Problem Relation Age of Onset   Throat cancer Mother    Bladder Cancer Father    Liver cancer Father    Colon cancer Neg Hx     Prior to Admission medications   Medication Sig Start Date End Date Taking? Authorizing Provider  amLODipine  (NORVASC ) 10 MG tablet Take 1 tablet (10 mg total) by mouth daily. 12/15/23  Yes Arrien, Elidia Toribio, MD  DULoxetine  (CYMBALTA ) 60 MG capsule Take  60 mg by mouth 2 (two) times daily. 05/16/20  Yes [provider]  HYDROmorphone  (DILAUDID ) 4 MG tablet Take 4 mg by mouth every 8 (eight) hours as needed for severe pain (pain score 7-10).   Yes [provider]  labetalol  (NORMODYNE ) 300 MG tablet Take 1 tablet (300 mg total) by mouth 2 (two) times daily. 01/01/24  Yes Tat, Alm, MD  lisinopril  (ZESTRIL ) 20 MG tablet Take 1 tablet (20 mg total) by mouth daily. 01/02/24  Yes Tat, Alm, MD  morphine  (MSIR) 15 MG tablet Take 15 mg by mouth every 4 (four) hours as needed for severe pain (pain score 7-10).   Yes [provider]  ondansetron  (ZOFRAN -ODT) 8 MG disintegrating tablet Take 1 tablet (8 mg total) by mouth every 6 (six) hours as needed for nausea or vomiting. 11/29/23  Yes Darci Pore, MD  pantoprazole  (PROTONIX ) 40 MG tablet Take 1 tablet (40 mg  total) by mouth 2 (two) times daily. 11/29/23  Yes Darci Pore, MD  phenytoin  (DILANTIN ) 100 MG ER capsule Take 100 mg by mouth. Patient taking differently: Take 100 mg by mouth 2 (two) times daily. 12/09/23  Yes [provider]  prednisoLONE  acetate (PRED FORTE ) 1 % ophthalmic suspension Place 1 drop into the left eye in the morning and at bedtime. 12/14/23  Yes Arrien, Elidia Sieving, MD  pregabalin  (LYRICA ) 50 MG capsule Take 50 mg by mouth See admin instructions. Take 1 capsule by mouth every night at bedtime for 1 week then 1 capsule twice daily for 1 week then 1 capsule three times daily.   Yes [provider]  prochlorperazine  (COMPAZINE ) 10 MG tablet Take 1 tablet (10 mg total) by mouth every 6 (six) hours as needed for nausea or vomiting. 12/24/23  Yes Bryn Bernardino NOVAK, MD  rOPINIRole  (REQUIP ) 2 MG tablet Take 4 mg by mouth See admin instructions. Take 1 tablet in the morning and 2 tablet every evening. 11/11/14  Yes [provider]  simethicone (GAS RELIEF EXTRA STRENGTH) 125 MG chewable tablet Chew 125 mg by mouth daily as  needed for flatulence.  05/24/14  Yes [provider]  spironolactone (ALDACTONE) 25 MG tablet Take 1 tablet (25 mg total) by mouth daily. 12/14/23  Yes Arrien, Mauricio Daniel, MD  sucralfate (CARAFATE) 1 GM/10ML suspension Take 10 mLs (1 g total) by mouth 4 (four) times daily -  with meals and at bedtime. 12/07/23  Yes Tat, Alm, MD  morphine  (MS CONTIN ) 15 MG 12 hr tablet Take 15 mg by mouth every 12 (twelve) hours. Patient not taking: Reported on 01/03/2024    [provider]    Physical Exam: Vitals:   01/03/24 1830 01/03/24 1900 01/03/24 1930 01/03/24 1945  BP: (!) 169/102 (!) 172/111 (!) 168/95 (!) 171/105  Pulse: 87 87 93 86  Resp: 20 18 (!) 28 17  Temp: 98.3 F (36.8 C)     TempSrc: Oral     SpO2: 93% 98% 100% 99%  Weight:      Height:       General:  Awake and alert and oriented x3. Not in any acute distress.  HEENT: NCAT.  PERRLA. EOMI. Sclerae anicteric.  Moist mucosal membranes. Neck: Neck supple without lymphadenopathy. No carotid bruits. No masses palpated.  Cardiovascular: Regular rate with normal S1-S2 sounds. No murmurs, rubs or gallops auscultated. No JVD.  Respiratory: Clear breath sounds.  No accessory muscle use. Abdomen: Soft, tender to palpation of epigastric area, nondistended. Active bowel sounds. No masses or hepatosplenomegaly  Skin: No rashes, lesions, or ulcerations.  Dry, warm to touch. Musculoskeletal:  2+ dorsalis pedis and radial pulses. Good ROM.  No contractures  Psychiatric: Intact judgment and insight.  Mood appropriate to current condition. Neurologic: No focal neurological deficits. Strength is 5/5 x 4.  CN II - XII grossly intact.  Data Reviewed: EKG personally reviewed showed sinus tachycardia at rate of 137 bpm  Assessment and Plan: Recurrent intractable nausea, vomiting and abdominal pain Continue IV Zofran  p.r.n. Continue IV Protonix  40 mg twice daily Continue IV Dilaudid  0.5 mg every 4 hours as needed Continue IV  hydration Patient will be started on clear liquid diet in the morning with plan to advance as  tolerated Gastroenterology will be consulted admission await further recommendations  Leukocytosis/thrombocytosis This may be due to reactive process No obvious source of infection/bleeding at this time Continue to monitor WBC with morning labs  Hypokalemia possibly  due to hyperemesis K+ 2.9, this will be repleted  GERD (gastroesophageal reflux disease) Peptic ulcer disease Continue Protonix  40 mg IV twice daily Continue Carafate orally 3 times daily    Essential hypertension Continue amlodipine , lisinopril , labetalol   Fibromyalgia Continue Lyrica   Restless leg syndrome Continue Requip    GAD (generalized anxiety disorder) Continue Cymbalta      Advance Care Planning: Full code  Consults: Gastroenterology  Family Communication: None at bedside  Severity of Illness: The appropriate patient status for this patient is INPATIENT. Inpatient status is judged to be reasonable and necessary in order to provide the required intensity of service to ensure the patient's safety. The patient's presenting symptoms, physical exam findings, and initial radiographic and laboratory data in the context of their chronic comorbidities is felt to place them at high risk for further clinical deterioration. Furthermore, it is not anticipated that the patient will be medically stable for discharge from the hospital within 2 midnights of admission.   * I certify that at the point of admission it is my clinical judgment that the patient will require inpatient hospital care spanning beyond 2 midnights from the point of admission due to high intensity of service, high risk for further deterioration and high frequency of surveillance required.*  Author: Billyjoe Go, DO 01/03/2024 8:02 PM  For on call review [email or web address].

## 2024-01-03 NOTE — ED Notes (Signed)
 Patient transported to CT

## 2024-01-04 DIAGNOSIS — K219 Gastro-esophageal reflux disease without esophagitis: Secondary | ICD-10-CM | POA: Diagnosis not present

## 2024-01-04 DIAGNOSIS — E876 Hypokalemia: Secondary | ICD-10-CM | POA: Diagnosis not present

## 2024-01-04 DIAGNOSIS — K279 Peptic ulcer, site unspecified, unspecified as acute or chronic, without hemorrhage or perforation: Secondary | ICD-10-CM | POA: Diagnosis not present

## 2024-01-04 DIAGNOSIS — K2100 Gastro-esophageal reflux disease with esophagitis, without bleeding: Secondary | ICD-10-CM | POA: Diagnosis not present

## 2024-01-04 DIAGNOSIS — R112 Nausea with vomiting, unspecified: Secondary | ICD-10-CM | POA: Diagnosis not present

## 2024-01-04 DIAGNOSIS — K449 Diaphragmatic hernia without obstruction or gangrene: Secondary | ICD-10-CM

## 2024-01-04 LAB — COMPREHENSIVE METABOLIC PANEL WITH GFR
ALT: <5 U/L (ref 0–44)
AST: 11 U/L — ABNORMAL LOW (ref 15–41)
Albumin: 3.4 g/dL — ABNORMAL LOW (ref 3.5–5.0)
Alkaline Phosphatase: 72 U/L (ref 38–126)
Anion gap: 11 (ref 5–15)
BUN: 16 mg/dL (ref 8–23)
CO2: 24 mmol/L (ref 22–32)
Calcium: 9.1 mg/dL (ref 8.9–10.3)
Chloride: 106 mmol/L (ref 98–111)
Creatinine, Ser: 0.88 mg/dL (ref 0.44–1.00)
GFR, Estimated: >60 mL/min (ref >60)
Glucose, Bld: 102 mg/dL — ABNORMAL HIGH (ref 70–99)
Potassium: 2.8 mmol/L — ABNORMAL LOW (ref 3.5–5.1)
Sodium: 141 mmol/L (ref 135–145)
Total Bilirubin: <0.2 mg/dL (ref 0.0–1.2)
Total Protein: 5.9 g/dL — ABNORMAL LOW (ref 6.5–8.1)

## 2024-01-04 LAB — CBC
HCT: 35.5 % — ABNORMAL LOW (ref 36.0–46.0)
Hemoglobin: 11.4 g/dL — ABNORMAL LOW (ref 12.0–15.0)
MCH: 26.9 pg (ref 26.0–34.0)
MCHC: 32.1 g/dL (ref 30.0–36.0)
MCV: 83.7 fL (ref 80.0–100.0)
Platelets: 688 K/uL — ABNORMAL HIGH (ref 150–400)
RBC: 4.24 MIL/uL (ref 3.87–5.11)
RDW: 15.6 % — ABNORMAL HIGH (ref 11.5–15.5)
WBC: 11.2 K/uL — ABNORMAL HIGH (ref 4.0–10.5)
nRBC: 0 % (ref 0.0–0.2)

## 2024-01-04 LAB — MAGNESIUM: Magnesium: 2.2 mg/dL (ref 1.7–2.4)

## 2024-01-04 LAB — PHOSPHORUS: Phosphorus: 3.1 mg/dL (ref 2.5–4.6)

## 2024-01-04 MED ORDER — POTASSIUM CHLORIDE 10 MEQ/100ML IV SOLN
10.0000 meq | INTRAVENOUS | Status: AC
Start: 2024-01-04 — End: 2024-01-04
  Administered 2024-01-04 (×4): 10 meq via INTRAVENOUS
  Filled 2024-01-04 (×4): qty 100

## 2024-01-04 MED ORDER — BOOST / RESOURCE BREEZE PO LIQD CUSTOM
1.0000 | Freq: Three times a day (TID) | ORAL | Status: DC
  Administered 2024-01-04 – 2024-01-05 (×3): 1 via ORAL
  Administered 2024-01-05: 237 mL via ORAL
  Administered 2024-01-06: 1 via ORAL

## 2024-01-04 MED ORDER — POTASSIUM CHLORIDE 20 MEQ PO PACK
40.0000 meq | PACK | Freq: Two times a day (BID) | ORAL | Status: AC
  Administered 2024-01-04 (×2): 40 meq via ORAL
  Filled 2024-01-04 (×2): qty 2

## 2024-01-04 NOTE — Consult Note (Signed)
 Renee Gutierrez Renee Gutierrez, M.D. Gastroenterology & Hepatology                                           Patient Name: Renee Gutierrez  MRN: [id number] Admission Date: 01/03/2024 Date of Evaluation:  01/04/2024 Time of Evaluation: 12:32 PM  Chief Complaint: Abdominal pain nausea and vomiting, inability to tolerate p.o. 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   Patient was discharged from the hospital 3 days ago with recent hospitalization.  At that time CT was obtained which was concerning for skip lesions and patient underwent colonoscopy without any overt finding of inflammatory bowel disease.  Normal TI and minimally fibrotic appearing ascending segment  Patient is returning again as she reports not able to keep any food down.  Would have epigastric pain nausea and vomiting.  Denies any fever or chills fresh blood or melena in the stool.  No hematemesis  Labs with potassium 2.8 normal BUN/creatinine ratio hemoglobin 11.4  CT with recurrent paraesophageal hernia status post prior Nissen fundoplication.  Endoscopic history :   EGD 12/05/2023: 3 cm paraesophageal hernia, prior nissen fundoplication at GE junction, non-bleeding gastric ulcer in herniated sac with a nonbleeding visible vessel s/p bipolar cautery, normal stomach s/p biopsy, normal duodenum.  Recommended referral for hiatal hernia repair at [facility].     EGD 11/28/2023: -LA Grade D erosive esophagitis with no bleeding -Non-bleeding gastric ulcer (20mm in largest dimension) with a flat pigmented spot (Forrest Class IIc). Injected. Ulcer bed coated with PuraStat. -Gastritis with diffuse erosions, erythema and friability entire stomach. Biopsied. Mild nonspecific reactive gastropathy. Neg for H.pylori -medium sized hiatal hernia -Normal duodenal bulb, first portion of duodenum and second portion of duodenum. -recommended PPI BID, carafate QID -recommended No NSAIDs -plan for EGD 8 weeks.  Last EGD/colonoscopy [facility] 2013: Op notes not available/path as outlined Duodenal bx: patchy gastric foveolar metaplasia and mild non-specific reactive/reparative changes Stomach bx: gastric antral and body mucosa with no path dx, no gastrisit. No h.pylori.  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   Past Surgical History:  Past Surgical History:  Procedure Laterality Date   ABDOMINAL HYSTERECTOMY     ABDOMINAL SURGERY     tumor  removed   APPENDECTOMY     BRAIN SURGERY     to fix AVM   CEREBRAL ANEURYSM REPAIR     CERVICAL FUSION     CESAREAN SECTION     CHOLECYSTECTOMY     COLONOSCOPY N/A 12/31/2023   Procedure: COLONOSCOPY;  Surgeon: Shaaron Lamar HERO, MD;  [facility];  Service: Endoscopy;  Laterality: N/A;   ESOPHAGEAL DILATION N/A 11/28/2023   Procedure: DILATION,  ESOPHAGUS;  Surgeon: Cindie Carlin POUR, DO;  Location: [facility];  Service: Endoscopy;  Laterality: N/A;   ESOPHAGOGASTRODUODENOSCOPY N/A 11/28/2023   Procedure: EGD (ESOPHAGOGASTRODUODENOSCOPY);  Surgeon: Cindie Carlin POUR, DO;  Location: [facility];  Service: Endoscopy;  Laterality: N/A;   ESOPHAGOGASTRODUODENOSCOPY N/A 12/05/2023   Procedure: EGD (ESOPHAGOGASTRODUODENOSCOPY);  Surgeon: Eartha Flavors, Toribio, MD;  Location: [facility];  Service: Gastroenterology;  Laterality: N/A;   LUNG SURGERY     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   Family History:  Family History  Problem Relation Age of Onset   Throat cancer Mother    Bladder Cancer Father    Liver cancer Father    Colon cancer Neg Hx    Social History:  Social History   Tobacco Use   Smoking status: Every Day    Types: Cigarettes   Smokeless tobacco: Never   Tobacco comments:    1 cigarette daily   Vaping Use   Vaping status: Never Used  Substance Use Topics   Alcohol use: No   Drug use: No    Home Medications:  Prior to Admission medications   Medication Sig Start Date End Date Taking? Authorizing Provider  amLODipine  (NORVASC ) 10 MG tablet Take 1 tablet (10 mg total) by mouth daily. 12/15/23  Yes Arrien, Elidia Toribio, MD  DULoxetine  (CYMBALTA ) 60 MG capsule Take 60 mg by mouth 2 (two) times daily. 05/16/20  Yes [provider]  HYDROmorphone  (DILAUDID ) 4 MG tablet Take 4 mg by mouth every 8 (eight) hours as needed for severe pain (pain score 7-10).   Yes [provider]  labetalol  (NORMODYNE ) 300 MG tablet Take 1 tablet (300 mg total) by mouth 2 (two) times daily. 01/01/24  Yes Tat, Alm, MD  lisinopril  (ZESTRIL ) 20 MG tablet Take 1 tablet (20 mg total) by mouth daily. 01/02/24  Yes Tat, Alm, MD  morphine  (MSIR) 15 MG tablet Take 15 mg by mouth every 4 (four) hours as needed for severe pain (pain score 7-10).   Yes [provider]   ondansetron  (ZOFRAN -ODT) 8 MG disintegrating tablet Take 1 tablet (8 mg total) by mouth every 6 (six) hours as needed for nausea or vomiting. 11/29/23  Yes Darci Pore, MD  pantoprazole  (PROTONIX ) 40 MG tablet Take 1 tablet (40 mg total) by mouth 2 (two) times daily. 11/29/23  Yes Darci Pore, MD  phenytoin  (DILANTIN ) 100 MG ER capsule Take 100 mg by mouth. Patient taking differently: Take 100 mg by mouth 2 (two) times daily. 12/09/23  Yes [provider]  prednisoLONE  acetate (PRED FORTE ) 1 % ophthalmic suspension Place 1 drop into the left eye in the morning and at bedtime. 12/14/23  Yes Arrien, Elidia Toribio, MD  pregabalin  (LYRICA ) 50 MG capsule Take 50 mg by mouth See admin instructions. Take 1 capsule by mouth every night at bedtime for 1 week then 1 capsule twice daily for 1 week then 1 capsule three times daily.   Yes [provider]  prochlorperazine  (COMPAZINE ) 10 MG tablet Take 1 tablet (10 mg total) by mouth every 6 (six) hours as needed for nausea or vomiting. 12/24/23  Yes Bryn Bernardino NOVAK, MD  rOPINIRole  (REQUIP ) 2 MG tablet Take 4 mg by mouth See admin instructions. Take 1 tablet in the morning and 2 tablet every evening. 11/11/14  Yes [provider]  simethicone (GAS RELIEF EXTRA STRENGTH) 125 MG chewable tablet Chew 125 mg by mouth daily as needed for flatulence.  05/24/14  Yes [provider]  spironolactone (ALDACTONE) 25 MG tablet Take 1 tablet (25 mg total) by mouth daily. 12/14/23  Yes Arrien, Mauricio Daniel, MD  sucralfate (CARAFATE) 1 GM/10ML suspension Take 10 mLs (1 g total) by mouth 4 (four) times daily -  with meals and at bedtime. 12/07/23  Yes Tat, Alm, MD  morphine  (MS CONTIN ) 15 MG 12 hr tablet Take 15 mg by mouth every 12 (twelve) hours. Patient not taking: Reported on 01/03/2024    [provider]    Inpatient Medications:  Current Facility-Administered Medications:    acetaminophen  (TYLENOL ) tablet  650 mg, 650 mg, Oral, Q6H PRN **OR** acetaminophen  (TYLENOL ) suppository 650 mg, 650 mg, Rectal, Q6H PRN, Adefeso, Oladapo, DO   enoxaparin  (LOVENOX ) injection 30 mg, 30 mg, Subcutaneous, Q24H, Adefeso, Oladapo, DO, 30 mg at 01/03/24 2151   feeding supplement (BOOST / RESOURCE BREEZE) liquid 1 Container, 1 Container, Oral, TID BM, Adefeso, Oladapo, DO, 1 Container at 01/04/24 1107   HYDROmorphone  (DILAUDID ) injection 0.5 mg, 0.5 mg, Intravenous, Q4H PRN, Adefeso, Oladapo, DO, 0.5 mg at 01/04/24 1156   ondansetron  (ZOFRAN ) tablet 4 mg, 4 mg, Oral, Q6H PRN **OR** ondansetron  (ZOFRAN ) injection 4 mg, 4 mg, Intravenous, Q6H PRN, Adefeso, Oladapo, DO, 4 mg at 01/04/24 1021   pantoprazole  (PROTONIX ) injection 40 mg, 40 mg, Intravenous, Q12H, Adefeso, Oladapo, DO, 40 mg at 01/04/24 9185   potassium chloride  (KLOR-CON ) packet 40 mEq, 40 mEq, Oral, BID, Arlon Carliss ORN, DO, 40 mEq at 01/04/24 9182   potassium chloride  10 mEq in 100 mL IVPB, 10 mEq, Intravenous, Q1 Hr x 4, Calkins, Derek W, DO, Last Rate: 100 mL/hr at 01/04/24 1156, 10 mEq at 01/04/24 1156 Allergies: Buprenorphine hcl, Bupropion, Codeine, Sulfamethoxazole-trimethoprim, Nsaids, Pregabalin , Sulfa antibiotics, Tapentadol, Clindamycin, Clindamycin/lincomycin, Cymbalta  [duloxetine  hcl], Diclofenac, Diclofenac sodium, Gabapentin, Oxycodone , and Toradol  [ketorolac  tromethamine ]  Complete Review of Systems: GENERAL: negative for malaise, night sweats HEENT: No changes in hearing or vision, no nose bleeds or other nasal problems. NECK: Negative for lumps, goiter, pain and significant neck swelling RESPIRATORY: Negative for cough, wheezing CARDIOVASCULAR: Negative for chest pain, leg swelling, palpitations, orthopnea GI: SEE HPI MUSCULOSKELETAL: Negative for joint pain or swelling, back pain, and muscle pain. SKIN: Negative for lesions, rash PSYCH: Negative for sleep disturbance, mood disorder and recent psychosocial stressors. HEMATOLOGY Negative  for prolonged bleeding, bruising easily, and swollen nodes. ENDOCRINE: Negative for cold or heat intolerance, polyuria, polydipsia and goiter. NEURO: negative for tremor, gait imbalance, syncope and seizures. The remainder of the review of systems is noncontributory.  Physical Exam: BP 130/86 (BP Location: Left Arm)   Pulse 82   Temp 98.4 F (36.9 C)   Resp 17   Ht 4' 11 (1.499 m)   Wt 43.1 kg   SpO2 97%   BMI 19.20 kg/m  GENERAL: The patient is AO x3, in no acute distress. HEENT: Head is normocephalic and atraumatic. EOMI are intact. Mouth is well hydrated and without lesions. NECK: Supple. No masses LUNGS: Clear  to auscultation. No presence of rhonchi/wheezing/rales. Adequate chest expansion HEART: RRR, normal s1 and s2. ABDOMEN: Soft, mild epigastric tenderness, no guarding, no peritoneal signs, and nondistended. BS +. No masses.  Laboratory Data CBC:     Component Value Date/Time   WBC 11.2 (H) 01/04/2024 0419   RBC 4.24 01/04/2024 0419   HGB 11.4 (L) 01/04/2024 0419   HCT 35.5 (L) 01/04/2024 0419   PLT 688 (H) 01/04/2024 0419   MCV 83.7 01/04/2024 0419   MCH 26.9 01/04/2024 0419   MCHC 32.1 01/04/2024 0419   RDW 15.6 (H) 01/04/2024 0419   LYMPHSABS 1.6 12/23/2023 0359   MONOABS 0.7 12/23/2023 0359   EOSABS 0.0 12/23/2023 0359   BASOSABS 0.1 12/23/2023 0359   COAG:  Lab Results  Component Value Date   INR 0.9 12/28/2023   INR 0.9 12/21/2023   INR 1.0 12/05/2023    BMP:     Latest Ref Rng & Units 01/04/2024    4:19 AM 01/03/2024    1:36 PM 01/03/2024    1:24 PM  BMP  Glucose 70 - 99 mg/dL 897  878  880   BUN 8 - 23 mg/dL 16  19  18    Creatinine 0.44 - 1.00 mg/dL 9.11  8.69  8.65   Sodium 135 - 145 mmol/L 141  140  141   Potassium 3.5 - 5.1 mmol/L 2.8  2.9  2.9   Chloride 98 - 111 mmol/L 106  105  103   CO2 22 - 32 mmol/L 24   21   Calcium  8.9 - 10.3 mg/dL 9.1   89.7     HEPATIC:     Latest Ref Rng & Units 01/04/2024    4:19 AM 01/03/2024    1:42  PM 12/28/2023   10:31 AM  Hepatic Function  Total Protein 6.5 - 8.1 g/dL 5.9  7.5  6.3   Albumin 3.5 - 5.0 g/dL 3.4  4.2  3.5   AST 15 - 41 U/L 11  16  11    ALT 0 - 44 U/L <5  <5  <5   Alk Phosphatase 38 - 126 U/L 72  88  78   Total Bilirubin 0.0 - 1.2 mg/dL <9.7  0.2  <9.7   Bilirubin, Direct 0.0 - 0.2 mg/dL  <9.8      CARDIAC:  Lab Results  Component Value Date   CKTOTAL 40 08/01/2010   CKMB 1.5 08/01/2010   TROPONINI <0.30 08/01/2010     Imaging: I personally reviewed and interpreted the available imaging  CT ABDOMEN PELVIS 11/15   IMPRESSION: 1. 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 2. Interval resolution of pleural effusions. Small pericardial effusion, slightly decreased.  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   #Inability to tolerate oral diet #Recurrent abdominal with nausea/committing #Peptic ulcer disease #Paraesophageal hernia  Patient has recurrent hospitalization with upper GI symptoms and inability to tolerate  p.o. diet.  Known to have large cardia ulcer with large hiatal hernia. Patient was discharged from the hospital 3 days ago with recent hospitalization.  This could be progression of the ulceration .    Can consider CT angio abdomen to assess patency of vasculature after upper  endoscopy  Recs:  -Correct electrolytes to keep K>4 and Mg ?2 -IV PPI BID -Sucralfate  -Given persistent symptoms and history of peptic ulcer disease(large cardia ulcer) plan for repeat upper endoscopy tomorrow -NPO past midnight  - Depending on upper endoscopy results may initiate inpatient transfer for symptomatic hiatal hernia  Jasmain Ahlberg Renee Matt Delpizzo, MD Gastroenterology and Hepatology [facility] Gastroenterology  This chart has been completed using [facility] Dictation software, and while attempts have been made to ensure accuracy , certain words and phrases may not be transcribed as intended

## 2024-01-04 NOTE — Plan of Care (Signed)
   Problem: Activity: Goal: Risk for activity intolerance will decrease Outcome: Progressing   Problem: Coping: Goal: Level of anxiety will decrease Outcome: Progressing

## 2024-01-04 NOTE — Progress Notes (Signed)
 Progress Note   Patient: Renee Gutierrez FMW:[id number] DOB: 1960/04/02 DOA: 01/03/2024  DOS: the patient was seen and examined on 01/04/2024   Brief hospital course:  63 y.o. female with medical history significant of hypertension, GERD, PUD, IBS, fibromyalgia, RLS, CKD, trigeminal neuralgia, migraines, cerebral AVM involving brainstem, hiatal hernia s/p Nissen fundoplication (20 years ago), recurrent nausea and vomiting.  Recent multiple hospitalization who presents to the emergency department due to persistent nausea, vomiting and epigastric abdominal pain.   Assessment and Plan:  Intractable nausea and vomiting/epigastric pain - Likely similar etiology to previous hospitalizations.  Hiatal hernia, possible gastric ulcerations or gastritis.  Continue IV antiemetics, Protonix  IV twice daily, IV hydration.  GI consulted and following closely.  Hypokalemia - Likely secondary to extensive GI loss.  Replenishment initiated.  Will recheck BMP later today and in AM.  GERD/peptic ulcer disease - Known history, possible etiology of above.  Protonix , Carafate report.  Restless leg/fibromyalgia/GAD - Resume home medication regimen.    Subjective: Patient resting comfortably this morning but still frankly nauseous with epigastric pain.  Denies any vomiting this morning.  Denies fever, shortness of breath, chest pain, diarrhea.  But she is with similar symptoms multiple hospitalizations.  Physical Exam:  Vitals:   01/03/24 2320 01/04/24 0022 01/04/24 0139 01/04/24 1027  BP: (!) 161/92 126/87 (!) 153/103 130/86  Pulse: 96 98 (!) 102 82  Resp: 20   17  Temp: 98 F (36.7 C)  98.9 F (37.2 C) 98.4 F (36.9 C)  TempSrc: Oral  Oral   SpO2: 97%  97% 97%  Weight:      Height:        GENERAL:  Alert, pleasant, mild acute distress  HEENT:  EOMI CARDIOVASCULAR:  RRR, no murmurs appreciated RESPIRATORY:  Clear to auscultation, no wheezing, rales, or rhonchi GASTROINTESTINAL:  Soft,  epigastric tenderness, nondistended EXTREMITIES:  No LE edema bilaterally NEURO:  No new focal deficits appreciated SKIN:  No rashes noted PSYCH:  Appropriate mood and affect, anxious    Data Reviewed:  Imaging Studies: CT ABDOMEN PELVIS W CONTRAST Result Date: 01/03/2024 EXAM: CT ABDOMEN AND PELVIS WITH CONTRAST 01/03/2024 03:27:38 PM TECHNIQUE: CT of the abdomen and pelvis was performed with the administration of 75 mL of iohexol  (OMNIPAQUE ) 300 MG/ML solution. Multiplanar reformatted images are provided for review. Automated exposure control, iterative reconstruction, and/or weight-based adjustment of the mA/kV was utilized to reduce the radiation dose to as low as reasonably achievable. COMPARISON: 12/29/2023 CLINICAL HISTORY: Abdominal pain, acute, nonlocalized. Previous Nissen fundoplication. FINDINGS: LOWER CHEST: Interval resolution of pleural effusions. Small pericardial effusion, slightly decreased. LIVER: The liver is unremarkable. GALLBLADDER AND BILE DUCTS: Cholecystectomy clips. No biliary ductal dilatation. SPLEEN: No acute abnormality. PANCREAS: No acute abnormality. ADRENAL GLANDS: No acute abnormality. KIDNEYS, URETERS AND BLADDER: No stones in the kidneys or ureters. No hydronephrosis. No perinephric or periureteral stranding. Urinary bladder is unremarkable. 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 There is no bowel obstruction. PERITONEUM AND RETROPERITONEUM: No ascites. No free air. VASCULATURE: Aorta is normal in caliber. Mild scattered iliac calcified plaque without aneurysm. LYMPH NODES: No lymphadenopathy. REPRODUCTIVE ORGANS: Previous hysterectomy. BONES AND SOFT TISSUES: Lumbar levoscoliosis with multilevel spondylotic change. Left pelvic phleboliths. No acute osseous abnormality. No focal soft tissue abnormality. IMPRESSION: 1. 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 2. Interval resolution of pleural  effusions. Small pericardial effusion, slightly decreased. Electronically signed by: Dayne Hassell MD 01/03/2024 03:39 PM EST RP Workstation: [id number]   DG  Chest Port 1 View Result Date: 01/03/2024 CLINICAL DATA:  Shortness of breath and chest pain. EXAM: PORTABLE CHEST 1 VIEW COMPARISON:  12/28/2023 FINDINGS: Lungs are adequately inflated without effusion, focal airspace consolidation or pneumothorax. Moderate size hiatal hernia unchanged. Cardiomediastinal silhouette and remainder of the exam is unchanged. IMPRESSION: 1. No acute cardiopulmonary disease. 2. Moderate size hiatal hernia. Electronically Signed   By: Toribio Agreste M.D.   On: 01/03/2024 13:59   CT CHEST ABDOMEN PELVIS W CONTRAST Result Date: 12/29/2023 CLINICAL DATA:  Unintended weight loss, paraesophageal hernia, persistent chest and abdominal pain * Tracking Code: BO * EXAM: CT CHEST, ABDOMEN, AND PELVIS WITH CONTRAST TECHNIQUE: Multidetector CT imaging of the chest, abdomen and pelvis was performed following the standard protocol during bolus administration of intravenous contrast. RADIATION DOSE REDUCTION: This exam was performed according to the departmental dose-optimization program which includes automated exposure control, adjustment of the mA and/or kV according to patient size and/or use of iterative reconstruction technique. CONTRAST:  OMNIPAQUE  IOHEXOL  300 MG/ML  SOLN COMPARISON:  CT abdomen pelvis, 12/23/2023, CT chest angiogram, 11/25/2023 FINDINGS: CT CHEST FINDINGS Cardiovascular: Aortic atherosclerosis. Normal heart size. Unchanged small pericardial effusion. Mediastinum/Nodes: No enlarged mediastinal, hilar, or axillary lymph nodes. Large hiatal hernia with intrathoracic position of the gastric fundus; appearance suggests prior fundoplication. Thyroid gland, trachea, and esophagus demonstrate no significant findings. Lungs/Pleura: Moderate left, small right pleural effusions and associated atelectasis or consolidation.  Mild centrilobular emphysema. Musculoskeletal: No chest wall abnormality. No acute osseous findings. CT ABDOMEN PELVIS FINDINGS Hepatobiliary: No focal liver abnormality is seen. Status post cholecystectomy. Postoperative biliary ductal dilatation. Pancreas: Unremarkable. No pancreatic ductal dilatation or surrounding inflammatory changes. Spleen: Normal in size without significant abnormality. Adrenals/Urinary Tract: Adrenal glands are unremarkable. Kidneys are normal, without renal calculi, solid lesion, or hydronephrosis. Bladder is unremarkable. Stomach/Bowel: Stomach is within normal limits. Appendix not clearly visualized. Long segment colonic wall thickening and mucosal hyperenhancement involving multiple segments of colon from the cecum to the rectum, with multiple interposed segments of sparing. Vascular/Lymphatic: Aortic atherosclerosis. No enlarged abdominal or pelvic lymph nodes. Reproductive: Hysterectomy. Other: No abdominal wall hernia or abnormality. No ascites. Musculoskeletal: No acute osseous findings. IMPRESSION: 1. Long segment colonic wall thickening and mucosal hyperenhancement involving multiple segments of colon from the cecum to the rectum, with multiple interposed segments of sparing. Findings are consistent with nonspecific infectious, inflammatory, or ischemic colitis, appearance of skip lesions suggesting Crohn's colitis. 2. Moderate left, small right pleural effusions and associated atelectasis or consolidation. 3. Large hiatal hernia with intrathoracic position of the gastric fundus; appearance suggests prior fundoplication. 4. Emphysema. 5. Cholecystectomy. Aortic Atherosclerosis (ICD10-I70.0) and Emphysema (ICD10-J43.9). Electronically Signed   By: Marolyn JONETTA Jaksch M.D.   On: 12/29/2023 16:13   DG Abd 2 Views Result Date: 12/28/2023 CLINICAL DATA:  855384 Pain [id number] EXAM: ABDOMEN - 2 VIEW COMPARISON:  Correct over sixteenth 2025, December 01, 2023. FINDINGS: Air and stool-filled  nondilated loops of bowel. Mildly edematous appearance of the walls of loops of small bowel. Mild colonic stool burden diffusely throughout the colon. Visualized lung bases are unremarkable. Favored hiatal hernia. Degenerative changes of the lumbar spine. Status post cholecystectomy. Levoscoliosis of the lumbar spine IMPRESSION: 1. Nonobstructive bowel gas pattern. 2. Mildly edematous appearance of the walls of loops of small bowel. This is nonspecific but could reflect enteritis. Electronically Signed   By: Corean Salter M.D.   On: 12/28/2023 10:46   DG Chest 2 View Result Date: 12/28/2023 CLINICAL DATA:  Chest pain  EXAM: DG CHEST 2V COMPARISON:  December 04, 2023 FINDINGS: The cardiomediastinal silhouette is unchanged in contour.Rounded density over the midline likely reflecting a large hiatal hernia. Atherosclerotic calcifications of the aorta. No pleural effusion. No pneumothorax. No acute pleuroparenchymal abnormality. Lucency projecting anterior to the the xiphoid process on lateral radiograph could reflect summation artifact versus a ventral hernia. Multilevel degenerative changes of the thoracic spine. IMPRESSION: 1. No acute cardiopulmonary abnormality. Favored underlying large hiatal hernia 2. Lucency projecting anterior to the xiphoid process on lateral radiograph could reflect summation artifact versus a ventral hernia. Recommend correlation with physical exam. Electronically Signed   By: Corean Salter M.D.   On: 12/28/2023 10:43   CT ABDOMEN PELVIS W CONTRAST Result Date: 12/23/2023 EXAM: CT ABDOMEN AND PELVIS WITH CONTRAST 12/23/2023 02:41:11 PM TECHNIQUE: CT of the abdomen and pelvis was performed with the administration of 100 mL of iohexol  (OMNIPAQUE ) 300 MG/ML solution. Multiplanar reformatted images are provided for review. Automated exposure control, iterative reconstruction, and/or weight-based adjustment of the mA/kV was utilized to reduce the radiation dose to as low as  reasonably achievable. COMPARISON: CT Abd-pelv W/ 11/25/2023 and 12/04/2023. CLINICAL HISTORY: Upper abdominal pain with intractable nausea and vomiting. FINDINGS: LOWER CHEST: Small left pleural effusion. Left lower lobe subsegmental atelectasis. Small pericardial effusion is similar to 12/04/2023. There is adjacent free fluid in the posterior mediastinum. LIVER: The liver is unremarkable. GALLBLADDER AND BILE DUCTS: Cholecystectomy. No biliary ductal dilatation. SPLEEN: No acute abnormality. PANCREAS: No acute abnormality. ADRENAL GLANDS: Stable adrenal glands. KIDNEYS, URETERS AND BLADDER: No stones in the kidneys or ureters. No hydronephrosis. No perinephric or periureteral stranding. Urinary bladder is unremarkable. GI AND BOWEL: Postoperative change of Nissen fundoplication. Moderate hiatal hernia containing the fundoplication. Wall thickening about the distal esophagus is partially visualized and may in part be postoperative however esophagitis is not excluded. Stomach demonstrates no acute abnormality. There is no bowel obstruction. Normal appendix. PERITONEUM AND RETROPERITONEUM: No ascites. No free air. VASCULATURE: Aorta is normal in caliber. Aortic atherosclerotic calcification. LYMPH NODES: No lymphadenopathy. REPRODUCTIVE ORGANS: No acute abnormality. BONES AND SOFT TISSUES: No acute osseous abnormality. No focal soft tissue abnormality. IMPRESSION: 1. Postoperative change of Nissen fundoplication with moderate hiatal hernia containing the fundoplication; distal esophageal wall thickening may be postoperative, however esophagitis is not excluded. Adjacent free fluid in the posterior mediastinum. Consider CT of the chest with IV contrast and 100 ml oral contrast administered a few minutes prior to the exam to better evaluate the hernia and fundoplication. 2. Small left pleural effusion with left lower lobe subsegmental atelectasis, decreased from 10 / 16 / 25. 3. Small pericardial effusion, similar to  12/04/23. Electronically signed by: Norman Gatlin MD 12/23/2023 07:35 PM EST RP Workstation: [id number]   DG Chest 2 View Result Date: 12/21/2023 EXAM: 2 VIEW(S) XRAY OF THE CHEST 12/21/2023 02:47:01 PM COMPARISON: Comparison 9 days ago. CLINICAL HISTORY: chest pain chest pain FINDINGS: LUNGS AND PLEURA: Increased left basilar opacity is noted concerning for worsening pneumonia or atelectasis with associated effusion. Minimal right pleural effusion is noted. No pulmonary edema. No pneumothorax. HEART AND MEDIASTINUM: No acute abnormality of the cardiac and mediastinal silhouettes. BONES AND SOFT TISSUES: No acute osseous abnormality. IMPRESSION: 1. Worsening left basilar opacity with associated effusion, favoring pneumonia versus atelectasis. 2. Minimal right pleural effusion. Electronically signed by: Lynwood Seip MD 12/21/2023 02:49 PM EST RP Workstation: [id number]   ECHOCARDIOGRAM COMPLETE Result Date: 12/12/2023    ECHOCARDIOGRAM REPORT   Patient Name:   TAELAR GRONEWOLD Tatem  Date of Exam: 12/12/2023 Medical Rec #:  [id number]         Height:       59.0 in Accession #:    [id number]        Weight:       103.0 lb Date of Birth:  02-Apr-1960          BSA:          1.391 m Patient Age:    63 years          BP:           139/83 mmHg Patient Gender: F                 HR:           107 bpm. Exam Location:  [facility] Procedure: 2D Echo, Strain Analysis, Cardiac Doppler and Color Doppler (Both            Spectral and Color Flow Doppler were utilized during procedure). Indications:    CHF- Acute Diastolic I50.31  History:        Patient has no prior history of Echocardiogram examinations.                 Signs/Symptoms:Chest Pain; Risk Factors:Hypertension,                 Pre-diabetes, Dyslipidemia and Current Smoker.  Sonographer:    Koleen Popper RDCS Referring Phys: [phone] CARLOS MADERA  Sonographer Comments: Image acquisition challenging due to respiratory motion. Global longitudinal strain was attempted.  IMPRESSIONS  1. Left ventricular ejection fraction, by estimation, is >75%. The left ventricle has hyperdynamic function. The left ventricle has no regional wall motion abnormalities. Left ventricular diastolic parameters are consistent with Grade I diastolic dysfunction (impaired relaxation). The average left ventricular global longitudinal strain is -18.9 %. The global longitudinal strain is normal.  2. Right ventricular systolic function is normal. The right ventricular size is normal. Tricuspid regurgitation signal is inadequate for assessing PA pressure.  3. Moderate pericardial effusion is anterior to the right ventricle. Small pericardial effusion is lateral to LV and localized along RA. There is no evidence of cardiac tamponade.  4. The mitral valve is normal in structure. No evidence of mitral valve regurgitation. No evidence of mitral stenosis.  5. The aortic valve is tricuspid. Aortic valve regurgitation is not visualized. No aortic stenosis is present.  6. The inferior vena cava is normal in size with greater than 50% respiratory variability, suggesting right atrial pressure of 3 mmHg. Comparison(s): No prior Echocardiogram. FINDINGS  Left Ventricle: Left ventricular ejection fraction, by estimation, is >75%. The left ventricle has hyperdynamic function. The left ventricle has no regional wall motion abnormalities. The average left ventricular global longitudinal strain is -18.9 %. Strain was performed and the global longitudinal strain is normal. The left ventricular internal cavity size was normal in size. There is no left ventricular hypertrophy. Left ventricular diastolic parameters are consistent with Grade I diastolic dysfunction (impaired relaxation). Normal left ventricular filling pressure. Right Ventricle: The right ventricular size is normal. No increase in right ventricular wall thickness. Right ventricular systolic function is normal. Tricuspid regurgitation signal is inadequate for  assessing PA pressure. Left Atrium: Left atrial size was normal in size. Right Atrium: Right atrial size was normal in size. Pericardium: A moderately sized pericardial effusion is present. The pericardial effusion is anterior to the right ventricle. There is no evidence of cardiac tamponade. Mitral Valve: The mitral valve is normal in  structure. No evidence of mitral valve regurgitation. No evidence of mitral valve stenosis. Tricuspid Valve: The tricuspid valve is normal in structure. Tricuspid valve regurgitation is not demonstrated. No evidence of tricuspid stenosis. Aortic Valve: The aortic valve is tricuspid. Aortic valve regurgitation is not visualized. No aortic stenosis is present. Pulmonic Valve: The pulmonic valve was grossly normal. Pulmonic valve regurgitation is not visualized. No evidence of pulmonic stenosis. Aorta: The aortic root and ascending aorta are structurally normal, with no evidence of dilitation. Venous: The inferior vena cava is normal in size with greater than 50% respiratory variability, suggesting right atrial pressure of 3 mmHg. IAS/Shunts: No atrial level shunt detected by color flow Doppler. Additional Comments: 3D was performed not requiring image post processing on an independent workstation and was indeterminate.  LEFT VENTRICLE PLAX 2D LVIDd:         3.40 cm   Diastology LVIDs:         2.00 cm   LV e' medial:    5.44 cm/s LV PW:         1.10 cm   LV E/e' medial:  12.9 LV IVS:        1.10 cm   LV e' lateral:   9.25 cm/s LVOT diam:     1.80 cm   LV E/e' lateral: 7.6 LV SV:         47 LV SV Index:   34        2D Longitudinal Strain LVOT Area:     2.54 cm  2D Strain GLS Avg:     -18.9 %  RIGHT VENTRICLE             IVC RV Basal diam:  2.60 cm     IVC diam: 1.00 cm RV S prime:     20.00 cm/s TAPSE (M-mode): 2.2 cm LEFT ATRIUM             Index        RIGHT ATRIUM          Index LA diam:        2.70 cm 1.94 cm/m   RA Area:     8.90 cm LA Vol (A2C):   28.1 ml 20.20 ml/m  RA Volume:    14.30 ml 10.28 ml/m LA Vol (A4C):   32.5 ml 23.36 ml/m LA Biplane Vol: 31.7 ml 22.79 ml/m  AORTIC VALVE LVOT Vmax:   111.00 cm/s LVOT Vmean:  72.800 cm/s LVOT VTI:    0.184 m  AORTA Ao Root diam: 3.00 cm Ao Asc diam:  2.90 cm MITRAL VALVE MV Area (PHT): 5.46 cm     SHUNTS MV Decel Time: 139 msec     Systemic VTI:  0.18 m MV E velocity: 70.20 cm/s   Systemic Diam: 1.80 cm MV A velocity: 112.00 cm/s MV E/A ratio:  0.63 Vishnu Priya Mallipeddi Electronically signed by Diannah Late Mallipeddi Signature Date/Time: 12/12/2023/4:59:17 PM    Final    DG Chest Port 1 View Result Date: 12/12/2023 EXAM: 1 VIEW(S) XRAY OF THE CHEST 12/12/2023 04:42:00 AM COMPARISON: 12/01/2023 CLINICAL HISTORY: CP, HTN, leg swelling. CP, HTN, leg/foot swelling and pain, elevated BP @ time of imaging (203/93), painful stomach palpitations. FINDINGS: LUNGS AND PLEURA: Left base atelectasis. Mild right basilar atelectasis or scarring. No pulmonary edema. No pleural effusion. Skin fold artifact right chest. No pneumothorax. HEART AND MEDIASTINUM: Aortic calcification. Moderate chronic hiatal hernia. BONES AND SOFT TISSUES: Cervical spine surgical hardware noted. No acute osseous abnormality. IMPRESSION:  1. No acute cardiopulmonary abnormality ;minor lung base atelectasis. 2. Moderate chronic hiatal hernia. Electronically signed by: Helayne Hurst MD 12/12/2023 05:58 AM EDT RP Workstation: [id number]    Results are pending, will review when available.  Previous records (including but not limited to H&P, progress notes, nursing notes, TOC management) were reviewed in assessment of this patient.  Labs: CBC: Recent Labs  Lab 12/29/23 0459 01/01/24 0427 01/03/24 1324 01/03/24 1336 01/04/24 0419  WBC 13.7* 9.3 15.8*  --  11.2*  HGB 11.1* 11.3* 14.0 14.6 11.4*  HCT 34.8* 35.5* 42.8 43.0 35.5*  MCV 84.3 83.1 81.2  --  83.7  PLT 472* 702* 893*  --  688*   Basic Metabolic Panel: Recent Labs  Lab 12/29/23 0459 01/01/24 0427  01/03/24 1324 01/03/24 1336 01/04/24 0419  NA 135 140 141 140 141  K 3.5 3.3* 2.9* 2.9* 2.8*  CL 105 105 103 105 106  CO2 19* 22 21*  --  24  GLUCOSE 128* 105* 119* 121* 102*  BUN 8 7* 18 19 16   CREATININE 0.56 0.55 1.34* 1.30* 0.88  CALCIUM  8.4* 9.2 10.2  --  9.1  MG 1.7 2.0 2.2  --  2.2  PHOS 2.4* 2.9  --   --  3.1   Liver Function Tests: Recent Labs  Lab 01/03/24 1342 01/04/24 0419  AST 16 11*  ALT <5 <5  ALKPHOS 88 72  BILITOT 0.2 <0.2  PROT 7.5 5.9*  ALBUMIN 4.2 3.4*   CBG: No results for input(s): GLUCAP in the last 168 hours.  Scheduled Meds:  enoxaparin  (LOVENOX ) injection  30 mg Subcutaneous Q24H   feeding supplement  1 Container Oral TID BM   pantoprazole  (PROTONIX ) IV  40 mg Intravenous Q12H   potassium chloride   40 mEq Oral BID   Continuous Infusions:  potassium chloride  10 mEq (01/04/24 1014)   PRN Meds:.acetaminophen  **OR** acetaminophen , HYDROmorphone  (DILAUDID ) injection, ondansetron  **OR** ondansetron  (ZOFRAN ) IV  Family Communication: None at bedside  Disposition: Status is: Inpatient Remains inpatient appropriate because: Intractable nausea vomiting     Time spent: 40 minutes  Length of inpatient stay: 1 days  Author: Carliss LELON Canales, DO 01/04/2024 11:01 AM  For on call review [email or web address].

## 2024-01-04 NOTE — Hospital Course (Signed)
 64 y.o. female with medical history significant of hypertension, GERD, PUD, IBS, fibromyalgia, RLS, CKD, trigeminal neuralgia, migraines, cerebral AVM involving brainstem, hiatal hernia s/p Nissen fundoplication (20 years ago), recurrent nausea and vomiting.  Recent multiple hospitalization who presents to the emergency department due to persistent nausea, vomiting and epigastric abdominal pain.    Assessment and Plan:   Intractable nausea and vomiting/epigastric pain - Likely similar etiology to previous hospitalizations.  Hiatal hernia, possible gastric ulcerations or gastritis.  Continue IV antiemetics, Protonix  IV twice daily, IV hydration.  GI consulted and following closely.   Hypokalemia - Likely secondary to extensive GI loss.  Replenishment initiated.  Will recheck BMP later today and in AM.   GERD/peptic ulcer disease - Known history, possible etiology of above.  Protonix , Carafate report.   Restless leg/fibromyalgia/GAD - Resume home medication regimen.

## 2024-01-04 NOTE — H&P (View-Only) (Signed)
 Tanis Burnley Faizan Karle Desrosier, M.D. Gastroenterology & Hepatology                                           Patient Name: Renee Gutierrez  MRN: [id number] Admission Date: 01/03/2024 Date of Evaluation:  01/04/2024 Time of Evaluation: 12:32 PM  Chief Complaint: Abdominal pain nausea and vomiting, inability to tolerate p.o. 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   Patient was discharged from the hospital 3 days ago with recent hospitalization.  At that time CT was obtained which was concerning for skip lesions and patient underwent colonoscopy without any overt finding of inflammatory bowel disease.  Normal TI and minimally fibrotic appearing ascending segment  Patient is returning again as she reports not able to keep any food down.  Would have epigastric pain nausea and vomiting.  Denies any fever or chills fresh blood or melena in the stool.  No hematemesis  Labs with potassium 2.8 normal BUN/creatinine ratio hemoglobin 11.4  CT with recurrent paraesophageal hernia status post prior Nissen fundoplication.  Endoscopic history :   EGD 12/05/2023: 3 cm paraesophageal hernia, prior nissen fundoplication at GE junction, non-bleeding gastric ulcer in herniated sac with a nonbleeding visible vessel s/p bipolar cautery, normal stomach s/p biopsy, normal duodenum.  Recommended referral for hiatal hernia repair at [facility].     EGD 11/28/2023: -LA Grade D erosive esophagitis with no bleeding -Non-bleeding gastric ulcer (20mm in largest dimension) with a flat pigmented spot (Forrest Class IIc). Injected. Ulcer bed coated with PuraStat. -Gastritis with diffuse erosions, erythema and friability entire stomach. Biopsied. Mild nonspecific reactive gastropathy. Neg for H.pylori -medium sized hiatal hernia -Normal duodenal bulb, first portion of duodenum and second portion of duodenum. -recommended PPI BID, carafate QID -recommended No NSAIDs -plan for EGD 8 weeks.  Last EGD/colonoscopy [facility] 2013: Op notes not available/path as outlined Duodenal bx: patchy gastric foveolar metaplasia and mild non-specific reactive/reparative changes Stomach bx: gastric antral and body mucosa with no path dx, no gastrisit. No h.pylori.  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   Past Surgical History:  Past Surgical History:  Procedure Laterality Date   ABDOMINAL HYSTERECTOMY     ABDOMINAL SURGERY     tumor  removed   APPENDECTOMY     BRAIN SURGERY     to fix AVM   CEREBRAL ANEURYSM REPAIR     CERVICAL FUSION     CESAREAN SECTION     CHOLECYSTECTOMY     COLONOSCOPY N/A 12/31/2023   Procedure: COLONOSCOPY;  Surgeon: Shaaron Lamar HERO, MD;  [facility];  Service: Endoscopy;  Laterality: N/A;   ESOPHAGEAL DILATION N/A 11/28/2023   Procedure: DILATION,  ESOPHAGUS;  Surgeon: Cindie Carlin POUR, DO;  Location[facility];  Service: Endoscopy;  Laterality: N/A;   ESOPHAGOGASTRODUODENOSCOPY N/A 11/28/2023   Procedure: EGD (ESOPHAGOGASTRODUODENOSCOPY);  Surgeon: Cindie Carlin POUR, DO;  [facility];  Service: Endoscopy;  Laterality: N/A;   ESOPHAGOGASTRODUODENOSCOPY N/A 12/05/2023   Procedure: EGD (ESOPHAGOGASTRODUODENOSCOPY);  Surgeon: Eartha Flavors, Toribio, MD;  Location: [facility];  Service: Gastroenterology;  Laterality: N/A;   LUNG SURGERY     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   Family History:  Family History  Problem Relation Age of Onset   Throat cancer Mother    Bladder Cancer Father    Liver cancer Father    Colon cancer Neg Hx    Social History:  Social History   Tobacco Use   Smoking status: Every Day    Types: Cigarettes   Smokeless tobacco: Never   Tobacco comments:    1 cigarette daily   Vaping Use   Vaping status: Never Used  Substance Use Topics   Alcohol use: No   Drug use: No    Home Medications:  Prior to Admission medications   Medication Sig Start Date End Date Taking? Authorizing Provider  amLODipine  (NORVASC ) 10 MG tablet Take 1 tablet (10 mg total) by mouth daily. 12/15/23  Yes Arrien, Elidia Toribio, MD  DULoxetine  (CYMBALTA ) 60 MG capsule Take 60 mg by mouth 2 (two) times daily. 05/16/20  Yes [provider]  HYDROmorphone  (DILAUDID ) 4 MG tablet Take 4 mg by mouth every 8 (eight) hours as needed for severe pain (pain score 7-10).   Yes [provider]  labetalol  (NORMODYNE ) 300 MG tablet Take 1 tablet (300 mg total) by mouth 2 (two) times daily. 01/01/24  Yes Tat, Alm, MD  lisinopril  (ZESTRIL ) 20 MG tablet Take 1 tablet (20 mg total) by mouth daily. 01/02/24  Yes Tat, Alm, MD  morphine  (MSIR) 15 MG tablet Take 15 mg by mouth every 4 (four) hours as needed for severe pain (pain score 7-10).   Yes [provider]   ondansetron  (ZOFRAN -ODT) 8 MG disintegrating tablet Take 1 tablet (8 mg total) by mouth every 6 (six) hours as needed for nausea or vomiting. 11/29/23  Yes Darci Pore, MD  pantoprazole  (PROTONIX ) 40 MG tablet Take 1 tablet (40 mg total) by mouth 2 (two) times daily. 11/29/23  Yes Darci Pore, MD  phenytoin  (DILANTIN ) 100 MG ER capsule Take 100 mg by mouth. Patient taking differently: Take 100 mg by mouth 2 (two) times daily. 12/09/23  Yes [provider]  prednisoLONE  acetate (PRED FORTE ) 1 % ophthalmic suspension Place 1 drop into the left eye in the morning and at bedtime. 12/14/23  Yes Arrien, Elidia Toribio, MD  pregabalin  (LYRICA ) 50 MG capsule Take 50 mg by mouth See admin instructions. Take 1 capsule by mouth every night at bedtime for 1 week then 1 capsule twice daily for 1 week then 1 capsule three times daily.   Yes [provider]  prochlorperazine  (COMPAZINE ) 10 MG tablet Take 1 tablet (10 mg total) by mouth every 6 (six) hours as needed for nausea or vomiting. 12/24/23  Yes Bryn Bernardino NOVAK, MD  rOPINIRole  (REQUIP ) 2 MG tablet Take 4 mg by mouth See admin instructions. Take 1 tablet in the morning and 2 tablet every evening. 11/11/14  Yes [provider]  simethicone (GAS RELIEF EXTRA STRENGTH) 125 MG chewable tablet Chew 125 mg by mouth daily as needed for flatulence.  05/24/14  Yes [provider]  spironolactone (ALDACTONE) 25 MG tablet Take 1 tablet (25 mg total) by mouth daily. 12/14/23  Yes Arrien, Mauricio Daniel, MD  sucralfate (CARAFATE) 1 GM/10ML suspension Take 10 mLs (1 g total) by mouth 4 (four) times daily -  with meals and at bedtime. 12/07/23  Yes Tat, Alm, MD  morphine  (MS CONTIN ) 15 MG 12 hr tablet Take 15 mg by mouth every 12 (twelve) hours. Patient not taking: Reported on 01/03/2024    [provider]    Inpatient Medications:  Current Facility-Administered Medications:    acetaminophen  (TYLENOL ) tablet  650 mg, 650 mg, Oral, Q6H PRN **OR** acetaminophen  (TYLENOL ) suppository 650 mg, 650 mg, Rectal, Q6H PRN, Adefeso, Oladapo, DO   enoxaparin  (LOVENOX ) injection 30 mg, 30 mg, Subcutaneous, Q24H, Adefeso, Oladapo, DO, 30 mg at 01/03/24 2151   feeding supplement (BOOST / RESOURCE BREEZE) liquid 1 Container, 1 Container, Oral, TID BM, Adefeso, Oladapo, DO, 1 Container at 01/04/24 1107   HYDROmorphone  (DILAUDID ) injection 0.5 mg, 0.5 mg, Intravenous, Q4H PRN, Adefeso, Oladapo, DO, 0.5 mg at 01/04/24 1156   ondansetron  (ZOFRAN ) tablet 4 mg, 4 mg, Oral, Q6H PRN **OR** ondansetron  (ZOFRAN ) injection 4 mg, 4 mg, Intravenous, Q6H PRN, Adefeso, Oladapo, DO, 4 mg at 01/04/24 1021   pantoprazole  (PROTONIX ) injection 40 mg, 40 mg, Intravenous, Q12H, Adefeso, Oladapo, DO, 40 mg at 01/04/24 9185   potassium chloride  (KLOR-CON ) packet 40 mEq, 40 mEq, Oral, BID, Arlon Carliss ORN, DO, 40 mEq at 01/04/24 9182   potassium chloride  10 mEq in 100 mL IVPB, 10 mEq, Intravenous, Q1 Hr x 4, Calkins, Derek W, DO, Last Rate: 100 mL/hr at 01/04/24 1156, 10 mEq at 01/04/24 1156 Allergies: Buprenorphine hcl, Bupropion, Codeine, Sulfamethoxazole-trimethoprim, Nsaids, Pregabalin , Sulfa antibiotics, Tapentadol, Clindamycin, Clindamycin/lincomycin, Cymbalta  [duloxetine  hcl], Diclofenac, Diclofenac sodium, Gabapentin, Oxycodone , and Toradol  [ketorolac  tromethamine ]  Complete Review of Systems: GENERAL: negative for malaise, night sweats HEENT: No changes in hearing or vision, no nose bleeds or other nasal problems. NECK: Negative for lumps, goiter, pain and significant neck swelling RESPIRATORY: Negative for cough, wheezing CARDIOVASCULAR: Negative for chest pain, leg swelling, palpitations, orthopnea GI: SEE HPI MUSCULOSKELETAL: Negative for joint pain or swelling, back pain, and muscle pain. SKIN: Negative for lesions, rash PSYCH: Negative for sleep disturbance, mood disorder and recent psychosocial stressors. HEMATOLOGY Negative  for prolonged bleeding, bruising easily, and swollen nodes. ENDOCRINE: Negative for cold or heat intolerance, polyuria, polydipsia and goiter. NEURO: negative for tremor, gait imbalance, syncope and seizures. The remainder of the review of systems is noncontributory.  Physical Exam: BP 130/86 (BP Location: Left Arm)   Pulse 82   Temp 98.4 F (36.9 C)   Resp 17   Ht 4' 11 (1.499 m)   Wt 43.1 kg   SpO2 97%   BMI 19.20 kg/m  GENERAL: The patient is AO x3, in no acute distress. HEENT: Head is normocephalic and atraumatic. EOMI are intact. Mouth is well hydrated and without lesions. NECK: Supple. No masses LUNGS: Clear  to auscultation. No presence of rhonchi/wheezing/rales. Adequate chest expansion HEART: RRR, normal s1 and s2. ABDOMEN: Soft, mild epigastric tenderness, no guarding, no peritoneal signs, and nondistended. BS +. No masses.  Laboratory Data CBC:     Component Value Date/Time   WBC 11.2 (H) 01/04/2024 0419   RBC 4.24 01/04/2024 0419   HGB 11.4 (L) 01/04/2024 0419   HCT 35.5 (L) 01/04/2024 0419   PLT 688 (H) 01/04/2024 0419   MCV 83.7 01/04/2024 0419   MCH 26.9 01/04/2024 0419   MCHC 32.1 01/04/2024 0419   RDW 15.6 (H) 01/04/2024 0419   LYMPHSABS 1.6 12/23/2023 0359   MONOABS 0.7 12/23/2023 0359   EOSABS 0.0 12/23/2023 0359   BASOSABS 0.1 12/23/2023 0359   COAG:  Lab Results  Component Value Date   INR 0.9 12/28/2023   INR 0.9 12/21/2023   INR 1.0 12/05/2023    BMP:     Latest Ref Rng & Units 01/04/2024    4:19 AM 01/03/2024    1:36 PM 01/03/2024    1:24 PM  BMP  Glucose 70 - 99 mg/dL 897  878  880   BUN 8 - 23 mg/dL 16  19  18    Creatinine 0.44 - 1.00 mg/dL 9.11  8.69  8.65   Sodium 135 - 145 mmol/L 141  140  141   Potassium 3.5 - 5.1 mmol/L 2.8  2.9  2.9   Chloride 98 - 111 mmol/L 106  105  103   CO2 22 - 32 mmol/L 24   21   Calcium  8.9 - 10.3 mg/dL 9.1   89.7     HEPATIC:     Latest Ref Rng & Units 01/04/2024    4:19 AM 01/03/2024    1:42  PM 12/28/2023   10:31 AM  Hepatic Function  Total Protein 6.5 - 8.1 g/dL 5.9  7.5  6.3   Albumin 3.5 - 5.0 g/dL 3.4  4.2  3.5   AST 15 - 41 U/L 11  16  11    ALT 0 - 44 U/L <5  <5  <5   Alk Phosphatase 38 - 126 U/L 72  88  78   Total Bilirubin 0.0 - 1.2 mg/dL <9.7  0.2  <9.7   Bilirubin, Direct 0.0 - 0.2 mg/dL  <9.8      CARDIAC:  Lab Results  Component Value Date   CKTOTAL 40 08/01/2010   CKMB 1.5 08/01/2010   TROPONINI <0.30 08/01/2010     Imaging: I personally reviewed and interpreted the available imaging  CT ABDOMEN PELVIS 11/15   IMPRESSION: 1. 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 2. Interval resolution of pleural effusions. Small pericardial effusion, slightly decreased.  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   #Inability to tolerate oral diet #Recurrent abdominal with nausea/committing #Peptic ulcer disease #Paraesophageal hernia  Patient has recurrent hospitalization with upper GI symptoms and inability to tolerate  p.o. diet.  Known to have large cardia ulcer with large hiatal hernia. Patient was discharged from the hospital 3 days ago with recent hospitalization.  This could be progression of the ulceration .    Can consider CT angio abdomen to assess patency of vasculature after upper  endoscopy  Recs:  -Correct electrolytes to keep K>4 and Mg ?2 -IV PPI BID -Sucralfate  -Given persistent symptoms and history of peptic ulcer disease(large cardia ulcer) plan for repeat upper endoscopy tomorrow -NPO past midnight  - Depending on upper endoscopy results may initiate inpatient transfer for symptomatic hiatal hernia  Renee Gutierrez Faizan Matt Delpizzo, MD Gastroenterology and Hepatology [facility] Gastroenterology  This chart has been completed using [facility] Dictation software, and while attempts have been made to ensure accuracy , certain words and phrases may not be transcribed as intended

## 2024-01-04 NOTE — Discharge Instructions (Signed)

## 2024-01-04 NOTE — TOC Initial Note (Signed)
 Transition of Care [facility]) - Inpatient Brief Assessment   Patient Details  Name: Renee Gutierrez MRN: [id number] Date of Birth: 1960/08/23  Transition of Care [facility]) CM/SW Contact:    Sharlyne Stabs, RN Phone Number: 01/04/2024, 1:16 PM   Clinical Narrative:  Patient readmitted for intractable nausea and vomiting.  IPCM assessed on 12/29/23 for high risk of readmission.  PCP list added to AVS again, No other needs last admission. IPCM following.   Transition of Care Asessment: Insurance and Status: Insurance coverage has been reviewed Patient has primary care physician: No (PCP List added) Home environment has been reviewed: Home with spouse Prior level of function:: Independent Prior/Current Home Services: No current home services Social Drivers of Health Review: SDOH reviewed no interventions necessary Readmission risk has been reviewed: Yes Transition of care needs: no transition of care needs at this time     Barriers to Discharge: Continued Medical Work up   Activities of Daily Living   ADL Screening (condition at time of admission) Independently performs ADLs?: Yes (appropriate for developmental age) Is the patient deaf or have difficulty hearing?: No Does the patient have difficulty seeing, even when wearing glasses/contacts?: Yes Does the patient have difficulty concentrating, remembering, or making decisions?: No  Permission Sought/Granted                  Emotional Assessment     Admission diagnosis:  Hypokalemia [E87.6] Upper abdominal pain [R10.10] Intractable nausea and vomiting [R11.2] Nonspecific chest pain [R07.9] Nausea and vomiting, unspecified vomiting type [R11.2] Patient Active Problem List   Diagnosis Date Noted   Uncomplicated opioid dependence (HCC) 01/01/2024   Nausea vomiting and diarrhea 01/01/2024   Rectal bleeding 12/31/2023   Intractable nausea and vomiting 12/23/2023   Iron deficiency anemia due to chronic blood  loss 12/17/2023   Acute on chronic diastolic CHF (congestive heart failure) (HCC) 12/14/2023   Peptic ulcer disease 12/14/2023   Bilateral lower extremity edema 12/12/2023   Hypophosphatemia 12/12/2023   Abdominal pain 12/07/2023   GIB (gastrointestinal bleeding) 12/04/2023   Acute gastric ulcer with hemorrhage 11/28/2023   Acute post-hemorrhagic anemia 11/27/2023   Esophageal hiatal hernia 11/26/2023   Essential hypertension 11/26/2023   Chronic anemia 11/02/2023   Trigeminal neuralgia 05/13/2023   Anxiety 05/30/2020   AVM (arteriovenous malformation) brain 05/30/2020   External otitis of left ear 05/30/2020   Hypokalemia 05/30/2020   Hyperlipidemia 05/30/2020   History of intussusception 05/30/2020   Migraines 05/30/2020   Restless leg syndrome 05/30/2020   CKD (chronic kidney disease), 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/2020   Intussusception (HCC) 05/30/2020   Medicare annual wellness visit, subsequent 05/30/2020   MRSA (methicillin resistant staph aureus) culture positive 05/30/2020   Rash 05/30/2020   Prediabetes 05/30/2020   Nausea & vomiting 05/30/2020   Chronic hip pain 05/30/2020   IBS (irritable bowel syndrome) 05/30/2020   Long-term current use of opiate analgesic 11/19/2018   ACTH elevation 12/12/2016   Trigeminal neuralgia pain 07/31/2016   Resistant hypertension 02/18/2016   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   Ganglion cyst of dorsum of right wrist 09/13/2015   Mild intermittent asthma without complication 04/25/2015   Idiopathic peripheral neuropathy 11/22/2014   Adrenal adenoma, right 07/10/2014   Prolactinoma (HCC) 07/07/2014   Osteoarthritis of cervical spine 06/06/2014   Cubital tunnel syndrome 08/24/2013   Spinal stenosis of lumbar region with neurogenic claudication 08/03/2013   GAD (generalized anxiety disorder) 09/12/2011   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 08/08/2011   Severe episode  of recurrent major depressive disorder (HCC)  08/08/2011   GERD (gastroesophageal reflux disease) 11/01/2009   PCP:  Patient, No Pcp Per Pharmacy:   [id number] [place], [place] - [address] AT [facility] [address] [place] [address] Phone: [phone] Fax: [phone]  [facility] (Now [facility]) - [address] [place], [place] - [address] [place] [address] Phone: [phone] Fax: [phone]     Social Drivers of Health (SDOH) Social History: SDOH Screenings   Food Insecurity: No Food Insecurity (01/04/2024)  Recent Concern: Food Insecurity - Food Insecurity Present (12/28/2023)  Housing: Low Risk  (01/04/2024)  Transportation Needs: No Transportation Needs (01/04/2024)  Utilities: Not At Risk (01/04/2024)  Tobacco Use: High Risk (12/31/2023)   SDOH Interventions:     Readmission Risk Interventions    01/04/2024    1:15 PM 12/29/2023    2:24 PM 12/24/2023   10:17 AM  Readmission Risk Prevention Plan  Transportation Screening Complete Complete Complete  Medication Review Oceanographer) Complete Complete Complete  PCP or Specialist appointment within 3-5 days of discharge Not Complete    HRI or Home Care Consult Complete Complete Complete  SW Recovery Care/Counseling Consult Complete Complete Complete  Palliative Care Screening Not Applicable Not Applicable Not Applicable  Skilled Nursing Facility Not Applicable Not Applicable Not Applicable

## 2024-01-05 ENCOUNTER — Inpatient Hospital Stay (HOSPITAL_COMMUNITY): Admitting: Anesthesiology

## 2024-01-05 ENCOUNTER — Encounter (HOSPITAL_COMMUNITY): Payer: Self-pay | Admitting: Internal Medicine

## 2024-01-05 ENCOUNTER — Encounter (HOSPITAL_COMMUNITY)
Admission: EM | Disposition: A | Discharge status: Home / Self Care | Payer: Self-pay | Source: Home / Self Care | Attending: Internal Medicine

## 2024-01-05 ENCOUNTER — Other Ambulatory Visit: Payer: Self-pay

## 2024-01-05 DIAGNOSIS — N182 Chronic kidney disease, stage 2 (mild): Secondary | ICD-10-CM | POA: Diagnosis not present

## 2024-01-05 DIAGNOSIS — I130 Hypertensive heart and chronic kidney disease with heart failure and stage 1 through stage 4 chronic kidney disease, or unspecified chronic kidney disease: Secondary | ICD-10-CM | POA: Diagnosis not present

## 2024-01-05 DIAGNOSIS — R112 Nausea with vomiting, unspecified: Secondary | ICD-10-CM | POA: Diagnosis not present

## 2024-01-05 DIAGNOSIS — K279 Peptic ulcer, site unspecified, unspecified as acute or chronic, without hemorrhage or perforation: Secondary | ICD-10-CM | POA: Diagnosis not present

## 2024-01-05 DIAGNOSIS — I5033 Acute on chronic diastolic (congestive) heart failure: Secondary | ICD-10-CM

## 2024-01-05 DIAGNOSIS — K219 Gastro-esophageal reflux disease without esophagitis: Secondary | ICD-10-CM | POA: Diagnosis not present

## 2024-01-05 DIAGNOSIS — K259 Gastric ulcer, unspecified as acute or chronic, without hemorrhage or perforation: Secondary | ICD-10-CM

## 2024-01-05 DIAGNOSIS — E876 Hypokalemia: Secondary | ICD-10-CM | POA: Diagnosis not present

## 2024-01-05 HISTORY — PX: ESOPHAGOGASTRODUODENOSCOPY: SHX5428

## 2024-01-05 LAB — BASIC METABOLIC PANEL WITH GFR
Anion gap: 8 (ref 5–15)
BUN: 14 mg/dL (ref 8–23)
CO2: 25 mmol/L (ref 22–32)
Calcium: 9.3 mg/dL (ref 8.9–10.3)
Chloride: 108 mmol/L (ref 98–111)
Creatinine, Ser: 0.76 mg/dL (ref 0.44–1.00)
GFR, Estimated: >60 mL/min (ref >60)
Glucose, Bld: 86 mg/dL (ref 70–99)
Potassium: 4.9 mmol/L (ref 3.5–5.1)
Sodium: 141 mmol/L (ref 135–145)

## 2024-01-05 LAB — CBC
HCT: 38.5 % (ref 36.0–46.0)
Hemoglobin: 11.6 g/dL — ABNORMAL LOW (ref 12.0–15.0)
MCH: 26.4 pg (ref 26.0–34.0)
MCHC: 30.1 g/dL (ref 30.0–36.0)
MCV: 87.5 fL (ref 80.0–100.0)
Platelets: 634 K/uL — ABNORMAL HIGH (ref 150–400)
RBC: 4.4 MIL/uL (ref 3.87–5.11)
RDW: 15.6 % — ABNORMAL HIGH (ref 11.5–15.5)
WBC: 10.5 K/uL (ref 4.0–10.5)
nRBC: 0 % (ref 0.0–0.2)

## 2024-01-05 LAB — MAGNESIUM: Magnesium: 2.1 mg/dL (ref 1.7–2.4)

## 2024-01-05 SURGERY — EGD (ESOPHAGOGASTRODUODENOSCOPY)
Anesthesia: General

## 2024-01-05 MED ORDER — LACTATED RINGERS IV SOLN: INTRAVENOUS | Status: DC

## 2024-01-05 MED ORDER — PROPOFOL 10 MG/ML IV BOLUS
INTRAVENOUS | Status: DC | PRN
  Administered 2024-01-05 (×2): 60 mg via INTRAVENOUS
  Administered 2024-01-05: 30 mg via INTRAVENOUS

## 2024-01-05 MED ORDER — SODIUM CHLORIDE 0.9 % IV SOLN: INTRAVENOUS | Status: DC

## 2024-01-05 MED ORDER — LIDOCAINE 2% (20 MG/ML) 5 ML SYRINGE
INTRAMUSCULAR | Status: DC | PRN
  Administered 2024-01-05: 60 mg via INTRAVENOUS

## 2024-01-05 NOTE — Progress Notes (Signed)
 Plans for EGD today by Dr. Cindie.  Clear liquids still ordered: discussed with nursing and this was discontinued. She has not had any clear liquids this morning.  Made NPO Last Lovenox  dosing yesterday evening 2138.   Electrolytes have been replaced. Potassium 4.9 today, up from 2.8. Hgb stable at 11.6.

## 2024-01-05 NOTE — Interval H&P Note (Signed)
 History and Physical Interval Note:  01/05/2024 11:35 AM  Renee Gutierrez  has presented today for surgery, with the diagnosis of nausea, vomiting , peptic ulcer disease.  The various methods of treatment have been discussed with the patient and family. After consideration of risks, benefits and other options for treatment, the patient has consented to  Procedure(s): EGD (ESOPHAGOGASTRODUODENOSCOPY) (N/A) as a surgical intervention.  The patient's history has been reviewed, patient examined, no change in status, stable for surgery.  I have reviewed the patient's chart and labs.  Questions were answered to the patient's satisfaction.     Carlin MARLA Hasty

## 2024-01-05 NOTE — Transfer of Care (Signed)
 Immediate Anesthesia Transfer of Care Note  Patient: Renee Gutierrez  Procedure(s) Performed: EGD (ESOPHAGOGASTRODUODENOSCOPY)  Patient Location: Short Stay  Anesthesia Type:General  Level of Consciousness: awake  Airway & Oxygen Therapy: Patient Spontanous Breathing  Post-op Assessment: Report given to RN  Post vital signs: Reviewed and stable  Last Vitals:  Vitals Value Taken Time  BP    Temp    Pulse    Resp    SpO2      Last Pain:  Vitals:   01/05/24 1142  TempSrc:   PainSc: 8       Patients Stated Pain Goal: 5 (01/05/24 1041)  Complications: No notable events documented.

## 2024-01-05 NOTE — Anesthesia Preprocedure Evaluation (Signed)
 Anesthesia Evaluation  Patient identified by MRN, date of birth, ID band Patient awake    Reviewed: Allergy & Precautions, H&P , NPO status , Patient's Chart, lab work & pertinent test results, reviewed documented beta blocker date and time   Airway Mallampati: II  TM Distance: >3 FB Neck ROM: full    Dental no notable dental hx. (+) Dental Advisory Given   Pulmonary neg pulmonary ROS, asthma , Current Smoker and Patient abstained from smoking.   Pulmonary exam normal breath sounds clear to auscultation       Cardiovascular hypertension, +CHF  Normal cardiovascular exam Rhythm:regular Rate:Normal  Grade 1 diastolic dysfunction   Neuro/Psych  Headaches PSYCHIATRIC DISORDERS Anxiety Depression    Trigeminal nerve injury. AVM brain  Neuromuscular disease negative neurological ROS  negative psych ROS   GI/Hepatic Neg liver ROS, hiatal hernia, PUD,GERD  ,,Nissen history with intractable nausea and vomiting in the past   Endo/Other  negative endocrine ROS    Renal/GU   negative genitourinary   Musculoskeletal  (+) Arthritis , Osteoarthritis,  Fibromyalgia -  Abdominal   Peds  Hematology  (+) Blood dyscrasia   Anesthesia Other Findings   Reproductive/Obstetrics negative OB ROS                              Anesthesia Physical Anesthesia Plan  ASA: 3  Anesthesia Plan: General   Post-op Pain Management: Minimal or no pain anticipated   Induction: Intravenous  PONV Risk Score and Plan: Propofol infusion  Airway Management Planned: Natural Airway and Nasal Cannula  Additional Equipment: None  Intra-op Plan:   Post-operative Plan:   Informed Consent: I have reviewed the patients History and Physical, chart, labs and discussed the procedure including the risks, benefits and alternatives for the proposed anesthesia with the patient or authorized representative who has indicated his/her  understanding and acceptance.     Dental Advisory Given  Plan Discussed with: CRNA  Anesthesia Plan Comments:          Anesthesia Quick Evaluation

## 2024-01-05 NOTE — Progress Notes (Signed)
 Progress Note   Patient: Renee Gutierrez FMW:[id number] DOB: 20-Feb-1960 DOA: 01/03/2024  DOS: the patient was seen and examined on 01/05/2024   Brief hospital course:  63 y.o. female with medical history significant of hypertension, GERD, PUD, IBS, fibromyalgia, RLS, CKD, trigeminal neuralgia, migraines, cerebral AVM involving brainstem, hiatal hernia s/p Nissen fundoplication (20 years ago), recurrent nausea and vomiting.  Recent multiple hospitalization who presents to the emergency department due to persistent nausea, vomiting and epigastric abdominal pain.    Assessment and Plan:   Intractable nausea and vomiting/epigastric pain - Likely similar etiology to previous hospitalizations.  Hiatal hernia, possible gastric ulcerations or gastritis.  Continue IV antiemetics, Protonix  IV twice daily, IV hydration.  GI consulted and following closely.  Pending EGD later this morning.   Hypokalemia - Likely secondary to extensive GI loss.  Resolved after replenishment.  Will recheck BMP in AM.   GERD/peptic ulcer disease - Known history, possible etiology of above.  Protonix , Carafate on board.   Restless leg/fibromyalgia/GAD - Resume home medication regimen.   Subjective: Patient resting comfortably's morning.  Still having persistent nausea and epigastric pain.  Denies fever, chills, chest pain, vomiting, diarrhea.  Physical Exam:  Vitals:   01/05/24 0433 01/05/24 1041 01/05/24 1202 01/05/24 1215  BP: 116/81 (!) 159/79 (!) 175/87 (!) 187/90  Pulse: 98 82 87 80  Resp: 17 14 15 16   Temp:  98.1 F (36.7 C) 97.9 F (36.6 C)   TempSrc: Oral Oral    SpO2: 99% 99% 100% 98%  Weight:  43.1 kg    Height:  4' 11 (1.499 m)      GENERAL:  Alert, pleasant, mild acute distress  HEENT:  EOMI CARDIOVASCULAR:  RRR, no murmurs appreciated RESPIRATORY:  Clear to auscultation, no wheezing, rales, or rhonchi GASTROINTESTINAL:  Soft, epigastric tenderness, nondistended EXTREMITIES:  No LE edema  bilaterally NEURO:  No new focal deficits appreciated SKIN:  No rashes noted PSYCH:  Appropriate mood and affect, anxious   Data Reviewed:  Imaging Studies: CT ABDOMEN PELVIS W CONTRAST Result Date: 01/03/2024 EXAM: CT ABDOMEN AND PELVIS WITH CONTRAST 01/03/2024 03:27:38 PM TECHNIQUE: CT of the abdomen and pelvis was performed with the administration of 75 mL of iohexol  (OMNIPAQUE ) 300 MG/ML solution. Multiplanar reformatted images are provided for review. Automated exposure control, iterative reconstruction, and/or weight-based adjustment of the mA/kV was utilized to reduce the radiation dose to as low as reasonably achievable. COMPARISON: 12/29/2023 CLINICAL HISTORY: Abdominal pain, acute, nonlocalized. Previous Nissen fundoplication. FINDINGS: LOWER CHEST: Interval resolution of pleural effusions. Small pericardial effusion, slightly decreased. LIVER: The liver is unremarkable. GALLBLADDER AND BILE DUCTS: Cholecystectomy clips. No biliary ductal dilatation. SPLEEN: No acute abnormality. PANCREAS: No acute abnormality. ADRENAL GLANDS: No acute abnormality. KIDNEYS, URETERS AND BLADDER: No stones in the kidneys or ureters. No hydronephrosis. No perinephric or periureteral stranding. Urinary bladder is unremarkable. 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 There is no bowel obstruction. PERITONEUM AND RETROPERITONEUM: No ascites. No free air. VASCULATURE: Aorta is normal in caliber. Mild scattered iliac calcified plaque without aneurysm. LYMPH NODES: No lymphadenopathy. REPRODUCTIVE ORGANS: Previous hysterectomy. BONES AND SOFT TISSUES: Lumbar levoscoliosis with multilevel spondylotic change. Left pelvic phleboliths. No acute osseous abnormality. No focal soft tissue abnormality. IMPRESSION: 1. 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 2. Interval resolution of pleural effusions. Small pericardial effusion, slightly decreased.  Electronically signed by: Dayne Hassell MD 01/03/2024 03:39 PM EST RP Workstation: [id number]   DG Chest Port 1 View Result Date: 01/03/2024  CLINICAL DATA:  Shortness of breath and chest pain. EXAM: PORTABLE CHEST 1 VIEW COMPARISON:  12/28/2023 FINDINGS: Lungs are adequately inflated without effusion, focal airspace consolidation or pneumothorax. Moderate size hiatal hernia unchanged. Cardiomediastinal silhouette and remainder of the exam is unchanged. IMPRESSION: 1. No acute cardiopulmonary disease. 2. Moderate size hiatal hernia. Electronically Signed   By: Toribio Agreste M.D.   On: 01/03/2024 13:59   CT CHEST ABDOMEN PELVIS W CONTRAST Result Date: 12/29/2023 CLINICAL DATA:  Unintended weight loss, paraesophageal hernia, persistent chest and abdominal pain * Tracking Code: BO * EXAM: CT CHEST, ABDOMEN, AND PELVIS WITH CONTRAST TECHNIQUE: Multidetector CT imaging of the chest, abdomen and pelvis was performed following the standard protocol during bolus administration of intravenous contrast. RADIATION DOSE REDUCTION: This exam was performed according to the departmental dose-optimization program which includes automated exposure control, adjustment of the mA and/or kV according to patient size and/or use of iterative reconstruction technique. CONTRAST:  OMNIPAQUE  IOHEXOL  300 MG/ML  SOLN COMPARISON:  CT abdomen pelvis, 12/23/2023, CT chest angiogram, 11/25/2023 FINDINGS: CT CHEST FINDINGS Cardiovascular: Aortic atherosclerosis. Normal heart size. Unchanged small pericardial effusion. Mediastinum/Nodes: No enlarged mediastinal, hilar, or axillary lymph nodes. Large hiatal hernia with intrathoracic position of the gastric fundus; appearance suggests prior fundoplication. Thyroid gland, trachea, and esophagus demonstrate no significant findings. Lungs/Pleura: Moderate left, small right pleural effusions and associated atelectasis or consolidation. Mild centrilobular emphysema. Musculoskeletal: No chest  wall abnormality. No acute osseous findings. CT ABDOMEN PELVIS FINDINGS Hepatobiliary: No focal liver abnormality is seen. Status post cholecystectomy. Postoperative biliary ductal dilatation. Pancreas: Unremarkable. No pancreatic ductal dilatation or surrounding inflammatory changes. Spleen: Normal in size without significant abnormality. Adrenals/Urinary Tract: Adrenal glands are unremarkable. Kidneys are normal, without renal calculi, solid lesion, or hydronephrosis. Bladder is unremarkable. Stomach/Bowel: Stomach is within normal limits. Appendix not clearly visualized. Long segment colonic wall thickening and mucosal hyperenhancement involving multiple segments of colon from the cecum to the rectum, with multiple interposed segments of sparing. Vascular/Lymphatic: Aortic atherosclerosis. No enlarged abdominal or pelvic lymph nodes. Reproductive: Hysterectomy. Other: No abdominal wall hernia or abnormality. No ascites. Musculoskeletal: No acute osseous findings. IMPRESSION: 1. Long segment colonic wall thickening and mucosal hyperenhancement involving multiple segments of colon from the cecum to the rectum, with multiple interposed segments of sparing. Findings are consistent with nonspecific infectious, inflammatory, or ischemic colitis, appearance of skip lesions suggesting Crohn's colitis. 2. Moderate left, small right pleural effusions and associated atelectasis or consolidation. 3. Large hiatal hernia with intrathoracic position of the gastric fundus; appearance suggests prior fundoplication. 4. Emphysema. 5. Cholecystectomy. Aortic Atherosclerosis (ICD10-I70.0) and Emphysema (ICD10-J43.9). Electronically Signed   By: Marolyn JONETTA Jaksch M.D.   On: 12/29/2023 16:13   DG Abd 2 Views Result Date: 12/28/2023 CLINICAL DATA:  855384 Pain [id number] EXAM: ABDOMEN - 2 VIEW COMPARISON:  Correct over sixteenth 2025, December 01, 2023. FINDINGS: Air and stool-filled nondilated loops of bowel. Mildly edematous appearance of  the walls of loops of small bowel. Mild colonic stool burden diffusely throughout the colon. Visualized lung bases are unremarkable. Favored hiatal hernia. Degenerative changes of the lumbar spine. Status post cholecystectomy. Levoscoliosis of the lumbar spine IMPRESSION: 1. Nonobstructive bowel gas pattern. 2. Mildly edematous appearance of the walls of loops of small bowel. This is nonspecific but could reflect enteritis. Electronically Signed   By: Corean Salter M.D.   On: 12/28/2023 10:46   DG Chest 2 View Result Date: 12/28/2023 CLINICAL DATA:  Chest pain EXAM: DG CHEST 2V COMPARISON:  December 04, 2023 FINDINGS: The cardiomediastinal silhouette is unchanged in contour.Rounded density over the midline likely reflecting a large hiatal hernia. Atherosclerotic calcifications of the aorta. No pleural effusion. No pneumothorax. No acute pleuroparenchymal abnormality. Lucency projecting anterior to the the xiphoid process on lateral radiograph could reflect summation artifact versus a ventral hernia. Multilevel degenerative changes of the thoracic spine. IMPRESSION: 1. No acute cardiopulmonary abnormality. Favored underlying large hiatal hernia 2. Lucency projecting anterior to the xiphoid process on lateral radiograph could reflect summation artifact versus a ventral hernia. Recommend correlation with physical exam. Electronically Signed   By: Corean Salter M.D.   On: 12/28/2023 10:43   CT ABDOMEN PELVIS W CONTRAST Result Date: 12/23/2023 EXAM: CT ABDOMEN AND PELVIS WITH CONTRAST 12/23/2023 02:41:11 PM TECHNIQUE: CT of the abdomen and pelvis was performed with the administration of 100 mL of iohexol  (OMNIPAQUE ) 300 MG/ML solution. Multiplanar reformatted images are provided for review. Automated exposure control, iterative reconstruction, and/or weight-based adjustment of the mA/kV was utilized to reduce the radiation dose to as low as reasonably achievable. COMPARISON: CT Abd-pelv W/ 11/25/2023 and  12/04/2023. CLINICAL HISTORY: Upper abdominal pain with intractable nausea and vomiting. FINDINGS: LOWER CHEST: Small left pleural effusion. Left lower lobe subsegmental atelectasis. Small pericardial effusion is similar to 12/04/2023. There is adjacent free fluid in the posterior mediastinum. LIVER: The liver is unremarkable. GALLBLADDER AND BILE DUCTS: Cholecystectomy. No biliary ductal dilatation. SPLEEN: No acute abnormality. PANCREAS: No acute abnormality. ADRENAL GLANDS: Stable adrenal glands. KIDNEYS, URETERS AND BLADDER: No stones in the kidneys or ureters. No hydronephrosis. No perinephric or periureteral stranding. Urinary bladder is unremarkable. GI AND BOWEL: Postoperative change of Nissen fundoplication. Moderate hiatal hernia containing the fundoplication. Wall thickening about the distal esophagus is partially visualized and may in part be postoperative however esophagitis is not excluded. Stomach demonstrates no acute abnormality. There is no bowel obstruction. Normal appendix. PERITONEUM AND RETROPERITONEUM: No ascites. No free air. VASCULATURE: Aorta is normal in caliber. Aortic atherosclerotic calcification. LYMPH NODES: No lymphadenopathy. REPRODUCTIVE ORGANS: No acute abnormality. BONES AND SOFT TISSUES: No acute osseous abnormality. No focal soft tissue abnormality. IMPRESSION: 1. Postoperative change of Nissen fundoplication with moderate hiatal hernia containing the fundoplication; distal esophageal wall thickening may be postoperative, however esophagitis is not excluded. Adjacent free fluid in the posterior mediastinum. Consider CT of the chest with IV contrast and 100 ml oral contrast administered a few minutes prior to the exam to better evaluate the hernia and fundoplication. 2. Small left pleural effusion with left lower lobe subsegmental atelectasis, decreased from 10 / 16 / 25. 3. Small pericardial effusion, similar to 12/04/23. Electronically signed by: Norman Gatlin MD 12/23/2023  07:35 PM EST RP Workstation: [id number]   DG Chest 2 View Result Date: 12/21/2023 EXAM: 2 VIEW(S) XRAY OF THE CHEST 12/21/2023 02:47:01 PM COMPARISON: Comparison 9 days ago. CLINICAL HISTORY: chest pain chest pain FINDINGS: LUNGS AND PLEURA: Increased left basilar opacity is noted concerning for worsening pneumonia or atelectasis with associated effusion. Minimal right pleural effusion is noted. No pulmonary edema. No pneumothorax. HEART AND MEDIASTINUM: No acute abnormality of the cardiac and mediastinal silhouettes. BONES AND SOFT TISSUES: No acute osseous abnormality. IMPRESSION: 1. Worsening left basilar opacity with associated effusion, favoring pneumonia versus atelectasis. 2. Minimal right pleural effusion. Electronically signed by: Lynwood Seip MD 12/21/2023 02:49 PM EST RP Workstation: [id number]   ECHOCARDIOGRAM COMPLETE Result Date: 12/12/2023    ECHOCARDIOGRAM REPORT   Patient Name:   Renee Gutierrez Allender Date of Exam: 12/12/2023 Medical Rec #:  [id number]         Height:       59.0 in Accession #:    [id number]        Weight:       103.0 lb Date of Birth:  August 16, 1960          BSA:          1.391 m Patient Age:    63 years          BP:           139/83 mmHg Patient Gender: F                 HR:           107 bpm. Exam Location:  [facility] Procedure: 2D Echo, Strain Analysis, Cardiac Doppler and Color Doppler (Both            Spectral and Color Flow Doppler were utilized during procedure). Indications:    CHF- Acute Diastolic I50.31  History:        Patient has no prior history of Echocardiogram examinations.                 Signs/Symptoms:Chest Pain; Risk Factors:Hypertension,                 Pre-diabetes, Dyslipidemia and Current Smoker.  Sonographer:    Koleen Popper RDCS Referring Phys: [phone] CARLOS MADERA  Sonographer Comments: Image acquisition challenging due to respiratory motion. Global longitudinal strain was attempted. IMPRESSIONS  1. Left ventricular ejection fraction, by estimation, is  >75%. The left ventricle has hyperdynamic function. The left ventricle has no regional wall motion abnormalities. Left ventricular diastolic parameters are consistent with Grade I diastolic dysfunction (impaired relaxation). The average left ventricular global longitudinal strain is -18.9 %. The global longitudinal strain is normal.  2. Right ventricular systolic function is normal. The right ventricular size is normal. Tricuspid regurgitation signal is inadequate for assessing PA pressure.  3. Moderate pericardial effusion is anterior to the right ventricle. Small pericardial effusion is lateral to LV and localized along RA. There is no evidence of cardiac tamponade.  4. The mitral valve is normal in structure. No evidence of mitral valve regurgitation. No evidence of mitral stenosis.  5. The aortic valve is tricuspid. Aortic valve regurgitation is not visualized. No aortic stenosis is present.  6. The inferior vena cava is normal in size with greater than 50% respiratory variability, suggesting right atrial pressure of 3 mmHg. Comparison(s): No prior Echocardiogram. FINDINGS  Left Ventricle: Left ventricular ejection fraction, by estimation, is >75%. The left ventricle has hyperdynamic function. The left ventricle has no regional wall motion abnormalities. The average left ventricular global longitudinal strain is -18.9 %. Strain was performed and the global longitudinal strain is normal. The left ventricular internal cavity size was normal in size. There is no left ventricular hypertrophy. Left ventricular diastolic parameters are consistent with Grade I diastolic dysfunction (impaired relaxation). Normal left ventricular filling pressure. Right Ventricle: The right ventricular size is normal. No increase in right ventricular wall thickness. Right ventricular systolic function is normal. Tricuspid regurgitation signal is inadequate for assessing PA pressure. Left Atrium: Left atrial size was normal in size. Right  Atrium: Right atrial size was normal in size. Pericardium: A moderately sized pericardial effusion is present. The pericardial effusion is anterior to the right ventricle. There is no evidence of cardiac tamponade. Mitral Valve: The mitral valve is normal in structure. No evidence of mitral valve regurgitation. No  evidence of mitral valve stenosis. Tricuspid Valve: The tricuspid valve is normal in structure. Tricuspid valve regurgitation is not demonstrated. No evidence of tricuspid stenosis. Aortic Valve: The aortic valve is tricuspid. Aortic valve regurgitation is not visualized. No aortic stenosis is present. Pulmonic Valve: The pulmonic valve was grossly normal. Pulmonic valve regurgitation is not visualized. No evidence of pulmonic stenosis. Aorta: The aortic root and ascending aorta are structurally normal, with no evidence of dilitation. Venous: The inferior vena cava is normal in size with greater than 50% respiratory variability, suggesting right atrial pressure of 3 mmHg. IAS/Shunts: No atrial level shunt detected by color flow Doppler. Additional Comments: 3D was performed not requiring image post processing on an independent workstation and was indeterminate.  LEFT VENTRICLE PLAX 2D LVIDd:         3.40 cm   Diastology LVIDs:         2.00 cm   LV e' medial:    5.44 cm/s LV PW:         1.10 cm   LV E/e' medial:  12.9 LV IVS:        1.10 cm   LV e' lateral:   9.25 cm/s LVOT diam:     1.80 cm   LV E/e' lateral: 7.6 LV SV:         47 LV SV Index:   34        2D Longitudinal Strain LVOT Area:     2.54 cm  2D Strain GLS Avg:     -18.9 %  RIGHT VENTRICLE             IVC RV Basal diam:  2.60 cm     IVC diam: 1.00 cm RV S prime:     20.00 cm/s TAPSE (M-mode): 2.2 cm LEFT ATRIUM             Index        RIGHT ATRIUM          Index LA diam:        2.70 cm 1.94 cm/m   RA Area:     8.90 cm LA Vol (A2C):   28.1 ml 20.20 ml/m  RA Volume:   14.30 ml 10.28 ml/m LA Vol (A4C):   32.5 ml 23.36 ml/m LA Biplane Vol: 31.7  ml 22.79 ml/m  AORTIC VALVE LVOT Vmax:   111.00 cm/s LVOT Vmean:  72.800 cm/s LVOT VTI:    0.184 m  AORTA Ao Root diam: 3.00 cm Ao Asc diam:  2.90 cm MITRAL VALVE MV Area (PHT): 5.46 cm     SHUNTS MV Decel Time: 139 msec     Systemic VTI:  0.18 m MV E velocity: 70.20 cm/s   Systemic Diam: 1.80 cm MV A velocity: 112.00 cm/s MV E/A ratio:  0.63 Vishnu Priya Mallipeddi Electronically signed by Diannah Late Mallipeddi Signature Date/Time: 12/12/2023/4:59:17 PM    Final    DG Chest Port 1 View Result Date: 12/12/2023 EXAM: 1 VIEW(S) XRAY OF THE CHEST 12/12/2023 04:42:00 AM COMPARISON: 12/01/2023 CLINICAL HISTORY: CP, HTN, leg swelling. CP, HTN, leg/foot swelling and pain, elevated BP @ time of imaging (203/93), painful stomach palpitations. FINDINGS: LUNGS AND PLEURA: Left base atelectasis. Mild right basilar atelectasis or scarring. No pulmonary edema. No pleural effusion. Skin fold artifact right chest. No pneumothorax. HEART AND MEDIASTINUM: Aortic calcification. Moderate chronic hiatal hernia. BONES AND SOFT TISSUES: Cervical spine surgical hardware noted. No acute osseous abnormality. IMPRESSION: 1. No acute cardiopulmonary abnormality ;minor lung base  atelectasis. 2. Moderate chronic hiatal hernia. Electronically signed by: Helayne Hurst MD 12/12/2023 05:58 AM EDT RP Workstation: [id number]    Results are pending, will review when available.  Previous records (including but not limited to H&P, progress notes, nursing notes, TOC management) were reviewed in assessment of this patient.  Labs: CBC: Recent Labs  Lab 01/01/24 0427 01/03/24 1324 01/03/24 1336 01/04/24 0419 01/05/24 0409  WBC 9.3 15.8*  --  11.2* 10.5  HGB 11.3* 14.0 14.6 11.4* 11.6*  HCT 35.5* 42.8 43.0 35.5* 38.5  MCV 83.1 81.2  --  83.7 87.5  PLT 702* 893*  --  688* 634*   Basic Metabolic Panel: Recent Labs  Lab 01/01/24 0427 01/03/24 1324 01/03/24 1336 01/04/24 0419 01/05/24 0409  NA 140 141 140 141 141  K 3.3* 2.9*  2.9* 2.8* 4.9  CL 105 103 105 106 108  CO2 22 21*  --  24 25  GLUCOSE 105* 119* 121* 102* 86  BUN 7* 18 19 16 14   CREATININE 0.55 1.34* 1.30* 0.88 0.76  CALCIUM  9.2 10.2  --  9.1 9.3  MG 2.0 2.2  --  2.2 2.1  PHOS 2.9  --   --  3.1  --    Liver Function Tests: Recent Labs  Lab 01/03/24 1342 01/04/24 0419  AST 16 11*  ALT <5 <5  ALKPHOS 88 72  BILITOT 0.2 <0.2  PROT 7.5 5.9*  ALBUMIN 4.2 3.4*   CBG: No results for input(s): GLUCAP in the last 168 hours.  Scheduled Meds:  [MAR Hold] enoxaparin  (LOVENOX ) injection  30 mg Subcutaneous Q24H   [MAR Hold] feeding supplement  1 Container Oral TID BM   [MAR Hold] pantoprazole  (PROTONIX ) IV  40 mg Intravenous Q12H   Continuous Infusions:  sodium chloride      lactated ringers  10 mL/hr at 01/05/24 1048   lactated ringers      lactated ringers      PRN Meds:.[MAR Hold] acetaminophen  **OR** [MAR Hold] acetaminophen , [MAR Hold]  HYDROmorphone  (DILAUDID ) injection, [MAR Hold] ondansetron  **OR** [MAR Hold] ondansetron  (ZOFRAN ) IV  Family Communication: None at bedside  Disposition: Status is: Inpatient Remains inpatient appropriate because: Pending EGD     Time spent: 32 minutes  Length of inpatient stay: 2 days  Author: Carliss LELON Canales, DO 01/05/2024 12:34 PM  For on call review [email or web address].

## 2024-01-05 NOTE — Anesthesia Postprocedure Evaluation (Signed)
 Anesthesia Post Note  Patient: Renee Gutierrez  Procedure(s) Performed: EGD (ESOPHAGOGASTRODUODENOSCOPY)  Patient location during evaluation: PACU Anesthesia Type: General Level of consciousness: awake and alert Pain management: pain level controlled Vital Signs Assessment: post-procedure vital signs reviewed and stable Respiratory status: spontaneous breathing, nonlabored ventilation and respiratory function stable Cardiovascular status: blood pressure returned to baseline and stable Postop Assessment: no apparent nausea or vomiting Anesthetic complications: no   There were no known notable events for this encounter.   Last Vitals:  Vitals:   01/05/24 1215 01/05/24 1308  BP: (!) 187/90 (!) 161/92  Pulse: 80 81  Resp: 16 17  Temp:  36.7 C  SpO2: 98% 99%    Last Pain:  Vitals:   01/05/24 1338  TempSrc:   PainSc: 8                  Renee Gutierrez

## 2024-01-05 NOTE — Op Note (Signed)
 [facility] Patient Name: Renee Gutierrez Procedure Date: 01/05/2024 11:27 AM MRN: [id number] Date of Birth: 08-23-1960 Attending MD: Carlin POUR. Cindie , [place], [id number] CSN: [id number] Age: 63 Admit Type: Outpatient Procedure:                Upper GI endoscopy Indications:              Nausea with vomiting Providers:                Carlin POUR. Cindie, DO, Tammy Vaught, RN, Chad                            Wilson, Technician Referring MD:              Medicines:                See the Anesthesia note for documentation of the                            administered medications Complications:            No immediate complications. Estimated Blood Loss:     Estimated blood loss was minimal. Procedure:                Pre-Anesthesia Assessment:                           - The anesthesia plan was to use monitored                            anesthesia care (MAC).                           After obtaining informed consent, the endoscope was                            passed under direct vision. Throughout the                            procedure, the patient's blood pressure, pulse, and                            oxygen saturations were monitored continuously. The                            [id number] ([id number]) Upper was introduced through                            the mouth, and advanced to the second part of                            duodenum. The upper GI endoscopy was accomplished                            without difficulty. The patient tolerated the                            procedure well. Scope In: 11:47:38  AM Scope Out: 11:55:01 AM Total Procedure Duration: 0 hours 7 minutes 23 seconds  Findings:      Evidence of a Nissen fundoplication was found at the gastroesophageal       junction. The wrap appeared intact. This was traversed.      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      One non-bleeding cratered gastric ulcer with a clean ulcer base (Forrest       Class III) was  found in the cardia (in the hernia sac). The lesion was       20 mm in largest dimension. Does not appear much improved from prior       examination. Biopsies were taken with a cold forceps for histology.      The duodenal bulb, first portion of the duodenum and second portion of       the duodenum were normal. Impression:               - A Nissen fundoplication was found. The wrap                            appears intact.                           - 3 cm paraesophageal hernia.                           - Non-bleeding gastric ulcer with a clean ulcer                            base (Forrest Class III). Biopsied.                           - Normal duodenal bulb, first portion of the                            duodenum and second portion of the duodenum. Moderate Sedation:      Per Anesthesia Care Recommendation:           - Return patient to hospital ward for ongoing care.                           - Advance diet as tolerated.                           - I discussed case with general surgery, no                            indication for transfer at this juncture as no                            indication for inpatient hernia fixation. Patient                            has been referred to [facility] surgery and                            awaiting outpatient evaluation.                           -  Continue IV PPI twice daily. Liquid Carafate 4                            times daily.                           - Continue antiemetics Procedure Code(s):        --- Professional ---                           [phone], Esophagogastroduodenoscopy, flexible,                            transoral; with biopsy, single or multiple Diagnosis Code(s):        --- Professional ---                           [phone], Other specified postprocedural states                           K44.9, Diaphragmatic hernia without obstruction or                            gangrene                           K25.9, Gastric  ulcer, unspecified as acute or                            chronic, without hemorrhage or perforation                           R11.2, Nausea with vomiting, unspecified CPT copyright 2022 [employer]. All rights reserved. The codes documented in this report are preliminary and upon coder review may  be revised to meet current compliance requirements. Carlin POUR. Cindie, DO Carlin POUR. Cindie, DO 01/05/2024 12:13:42 PM This report has been signed electronically. Number of Addenda: 0

## 2024-01-05 NOTE — Plan of Care (Signed)
   Problem: Education: Goal: Knowledge of General Education information will improve Description Including pain rating scale, medication(s)/side effects and non-pharmacologic comfort measures Outcome: Progressing

## 2024-01-06 ENCOUNTER — Telehealth (INDEPENDENT_AMBULATORY_CARE_PROVIDER_SITE_OTHER): Payer: Self-pay | Admitting: Gastroenterology

## 2024-01-06 DIAGNOSIS — R1010 Upper abdominal pain, unspecified: Secondary | ICD-10-CM

## 2024-01-06 DIAGNOSIS — E876 Hypokalemia: Secondary | ICD-10-CM | POA: Diagnosis not present

## 2024-01-06 DIAGNOSIS — K219 Gastro-esophageal reflux disease without esophagitis: Secondary | ICD-10-CM | POA: Diagnosis not present

## 2024-01-06 DIAGNOSIS — R112 Nausea with vomiting, unspecified: Secondary | ICD-10-CM | POA: Diagnosis not present

## 2024-01-06 DIAGNOSIS — K279 Peptic ulcer, site unspecified, unspecified as acute or chronic, without hemorrhage or perforation: Secondary | ICD-10-CM | POA: Diagnosis not present

## 2024-01-06 LAB — BASIC METABOLIC PANEL WITH GFR
Anion gap: 9 (ref 5–15)
BUN: 10 mg/dL (ref 8–23)
CO2: 26 mmol/L (ref 22–32)
Calcium: 9.5 mg/dL (ref 8.9–10.3)
Chloride: 105 mmol/L (ref 98–111)
Creatinine, Ser: 0.66 mg/dL (ref 0.44–1.00)
GFR, Estimated: >60 mL/min (ref >60)
Glucose, Bld: 97 mg/dL (ref 70–99)
Potassium: 4 mmol/L (ref 3.5–5.1)
Sodium: 140 mmol/L (ref 135–145)

## 2024-01-06 LAB — CBC
HCT: 35.6 % — ABNORMAL LOW (ref 36.0–46.0)
Hemoglobin: 11 g/dL — ABNORMAL LOW (ref 12.0–15.0)
MCH: 26.4 pg (ref 26.0–34.0)
MCHC: 30.9 g/dL (ref 30.0–36.0)
MCV: 85.4 fL (ref 80.0–100.0)
Platelets: 631 K/uL — ABNORMAL HIGH (ref 150–400)
RBC: 4.17 MIL/uL (ref 3.87–5.11)
RDW: 15.8 % — ABNORMAL HIGH (ref 11.5–15.5)
WBC: 10.8 K/uL — ABNORMAL HIGH (ref 4.0–10.5)
nRBC: 0 % (ref 0.0–0.2)

## 2024-01-06 LAB — MAGNESIUM: Magnesium: 2.1 mg/dL (ref 1.7–2.4)

## 2024-01-06 MED ORDER — PHENYTOIN SODIUM EXTENDED 100 MG PO CAPS: 100.0000 mg | ORAL_CAPSULE | Freq: Two times a day (BID) | ORAL | Status: DC

## 2024-01-06 NOTE — Progress Notes (Addendum)
 Subjective: Having continued upper/epigastric pain, about the same as previous. Had a BM this morning without blood or melena. Endorses ongoing nausea. She is requesting pain medication. She had some ice cream this morning which she tolerated.   Objective: Vital signs in last 24 hours: Temp:  [97.9 F (36.6 C)-98.2 F (36.8 C)] 98 F (36.7 C) (11/18 0542) Pulse Rate:  [80-87] 85 (11/18 0542) Resp:  [14-18] 18 (11/18 0542) BP: (153-187)/(79-92) 153/88 (11/18 0542) SpO2:  [96 %-100 %] 96 % (11/18 0542) Weight:  [43.1 kg] 43.1 kg (11/17 1041) Last BM Date : 01/04/24 General:   Alert and oriented, pleasant Head:  Normocephalic and atraumatic. Eyes:  No icterus, sclera clear. Conjuctiva pink.  Mouth:  Without lesions, mucosa pink and moist.  Heart:  S1, S2 present, no murmurs noted.  Lungs: Clear to auscultation bilaterally, without wheezing, rales, or rhonchi.  Abdomen:  Bowel sounds present, soft, non-distended. No HSM or hernias noted. No rebound or guarding. No masses appreciated. Tenderness to epigastric region.  Neurologic:  Alert and  oriented x4;  grossly normal neurologically. Skin:  Warm and dry, intact without significant lesions.  Psych:  Alert and cooperative. Normal mood and affect.  Intake/Output from previous day: 11/17 0701 - 11/18 0700 In: 300 [I.V.:300] Out: 0  Intake/Output this shift: No intake/output data recorded.  Lab Results: Recent Labs    01/04/24 0419 01/05/24 0409 01/06/24 0431  WBC 11.2* 10.5 10.8*  HGB 11.4* 11.6* 11.0*  HCT 35.5* 38.5 35.6*  PLT 688* 634* 631*   BMET Recent Labs    01/04/24 0419 01/05/24 0409 01/06/24 0431  NA 141 141 140  K 2.8* 4.9 4.0  CL 106 108 105  CO2 24 25 26   GLUCOSE 102* 86 97  BUN 16 14 10   CREATININE 0.88 0.76 0.66  CALCIUM  9.1 9.3 9.5   LFT Recent Labs    01/03/24 1342 01/04/24 0419  PROT 7.5 5.9*  ALBUMIN 4.2 3.4*  AST 16 11*  ALT <5 <5  ALKPHOS 88 72  BILITOT 0.2 <0.2  BILIDIR <0.1  --    IBILI NOT CALCULATED  --     Assessment: Renee Gutierrez is a 63 year old female with 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/p surgery in 2017 and subsequent repair in 2018 with chronic left facial pain with numerous ED visits/hospitalizations, chronic pain syndrome, prior nissen fundoplication (2011), HTN, HLD, IBS, GERD, fibromyalgia, anxiety, history of intractable N/V, paraesophageal hernia with UGI bleed due to large ulcer in cardia located in the hernia sac requiring endoscopic management in Oct 2025, referred to [facility] as outpatient for repair, with recent admission, discharged 11/13, now presenting again with recurrent persistent abdominal pain nausea and vomiting and inability to tolerate oral diet.   Abdominal pain, nausea, vomiting: -recurrent symptoms  -Recent admission, with colonoscopy with one polyp, fibrotic appearing ascending segment of colon, normal ileum, biopsies with SSP -hgb stable at 11 -EGD yesterday with Nissen fundoplication, wrap intact,3 cm paraesophageal hernia,Non-bleeding gastric ulcer with a clean ulcer base (Forrest Class III),Normal duodenal bulb, first portion of the duodenum and second portion of the duodenum. -no surgical intervention warranted at this time, recommended to keep outpatient referral to [facility]   Plan: -PPI BID -continue carafate 1g QID -keep outpatient referral to [facility] for hernia repair  -will try advancing to soft diet today  -Anti emetics and pain management per hospitalist   Will see how she tolerates soft diet at lunch, if  she does well, okay to discharge from GI standpoint. Continue PPI BID, carafate 1g QID as outpatient. Our team will arrange GI follow up in the next couple of weeks.    LOS: 3 days    01/06/2024, 8:49 AM   Czarina Gingras L. Ashante Snelling, MSN, APRN, AGNP-C Adult-Gerontology Nurse Practitioner [facility]

## 2024-01-06 NOTE — Discharge Summary (Signed)
 Physician Discharge Summary   Patient: Renee Gutierrez MRN: [id number] DOB: 04-16-1960  Admit date:     01/03/2024  Discharge date: 01/06/24  Discharge Physician: Carliss LELON Canales   PCP: Patient, No Pcp Per   Recommendations at discharge:    Pt to be discharged home.   If you experience worsening fever, chills, chest pain, shortness of breath, or other concerning symptoms, please call your PCP or go to the emergency department immediately.  Discharge Diagnoses: Principal Problem:   Intractable nausea and vomiting Active Problems:   Hypokalemia   Peptic ulcer disease   GERD (gastroesophageal reflux disease)   Restless leg syndrome  Resolved Problems:   * No resolved hospital problems. *   Hospital Course:  63 y.o. female with medical history significant of hypertension, GERD, PUD, IBS, fibromyalgia, RLS, CKD, trigeminal neuralgia, migraines, cerebral AVM involving brainstem, hiatal hernia s/p Nissen fundoplication (20 years ago), recurrent nausea and vomiting.  Recent multiple hospitalization who presents to the emergency department due to persistent nausea, vomiting and epigastric abdominal pain.    Assessment and Plan:   Intractable nausea and vomiting/epigastric pain - Likely similar etiology to previous hospitalizations.  Hiatal hernia, possible gastric ulcerations or gastritis.  Saved IV antiemetics, Protonix  IV twice daily, IV hydration.  GI consulted and following closely.  EGD noting large gastric ulcer similar to previous EGD.  Biopsies taken.  Recommending continued PPI twice daily with Carafate 4 times daily.  Patient to follow-up with GI in the outpatient setting to go over biopsy results.  Recommend patient follow-up with general surgery/hernia specialist in the outpatient setting.  Reportedly patient is already attempting to establish with [facility] or [facility].   Hypokalemia - Likely secondary to extensive GI loss.  Resolved after replenishment.     GERD/peptic ulcer  disease - Known history, possible etiology of above.  Protonix , Carafate on board.   Restless leg/fibromyalgia/GAD - Resume home medication regimen.   Consultants: Gastroenterology Procedures performed: Esophagogastroduodenoscopy Disposition: Home Diet recommendation:  Discharge Diet Orders (From admission, onward)     Start     Ordered   01/06/24 0000  Diet - low sodium heart healthy        01/06/24 1018           Cardiac diet  DISCHARGE MEDICATION: Allergies as of 01/06/2024       Reactions   Buprenorphine Hcl Swelling   All extremities and neck and face swelled up huge according to patient   Bupropion Swelling, Other (See Comments)   Moody and depressed   Codeine Itching, Nausea And Vomiting, Nausea Only, Swelling   Sulfamethoxazole-trimethoprim Swelling, Other (See Comments)   Swelled up her eye   Nsaids Other (See Comments)   Unknown    Pregabalin  Swelling   Lyrica     Sulfa Antibiotics Other (See Comments)   Unknown    Tapentadol Nausea And Vomiting   Pt states she can take   Clindamycin Other (See Comments)   Heart burn   Clindamycin/lincomycin Rash, Other (See Comments)   Heart burn   Cymbalta  [duloxetine  Hcl] Swelling   Diclofenac Swelling   Cream    Diclofenac Sodium Swelling   Oral med   Gabapentin Swelling   Oxycodone  Nausea Only   No longer has a reaction per pt   Toradol  [ketorolac  Tromethamine ] Rash        Medication List     TAKE these medications    amLODipine  10 MG tablet Commonly known as: NORVASC  Take 1 tablet (10  mg total) by mouth daily.   DULoxetine  60 MG capsule Commonly known as: CYMBALTA  Take 60 mg by mouth 2 (two) times daily.   Gas Relief Extra Strength 125 MG chewable tablet Generic drug: simethicone Chew 125 mg by mouth daily as needed for flatulence.   HYDROmorphone  4 MG tablet Commonly known as: DILAUDID  Take 4 mg by mouth every 8 (eight) hours as needed for severe pain (pain score 7-10).   labetalol  300  MG tablet Commonly known as: NORMODYNE  Take 1 tablet (300 mg total) by mouth 2 (two) times daily.   lisinopril  20 MG tablet Commonly known as: ZESTRIL  Take 1 tablet (20 mg total) by mouth daily.   morphine  15 MG tablet Commonly known as: MSIR Take 15 mg by mouth every 4 (four) hours as needed for severe pain (pain score 7-10). What changed: Another medication with the same name was removed. Continue taking this medication, and follow the directions you see here.   ondansetron  8 MG disintegrating tablet Commonly known as: ZOFRAN -ODT Take 1 tablet (8 mg total) by mouth every 6 (six) hours as needed for nausea or vomiting.   pantoprazole  40 MG tablet Commonly known as: Protonix  Take 1 tablet (40 mg total) by mouth 2 (two) times daily.   phenytoin  100 MG ER capsule Commonly known as: DILANTIN  Take 1 capsule (100 mg total) by mouth 2 (two) times daily.   prednisoLONE  acetate 1 % ophthalmic suspension Commonly known as: PRED FORTE  Place 1 drop into the left eye in the morning and at bedtime.   pregabalin  50 MG capsule Commonly known as: LYRICA  Take 50 mg by mouth See admin instructions. Take 1 capsule by mouth every night at bedtime for 1 week then 1 capsule twice daily for 1 week then 1 capsule three times daily.   prochlorperazine  10 MG tablet Commonly known as: COMPAZINE  Take 1 tablet (10 mg total) by mouth every 6 (six) hours as needed for nausea or vomiting.   rOPINIRole  2 MG tablet Commonly known as: REQUIP  Take 4 mg by mouth See admin instructions. Take 1 tablet in the morning and 2 tablet every evening.   spironolactone 25 MG tablet Commonly known as: ALDACTONE Take 1 tablet (25 mg total) by mouth daily.   sucralfate 1 GM/10ML suspension Commonly known as: CARAFATE Take 10 mLs (1 g total) by mouth 4 (four) times daily -  with meals and at bedtime.         Discharge Exam: Filed Weights   01/03/24 1255 01/05/24 1041  Weight: 43.1 kg 43.1 kg    GENERAL:   Alert, pleasant, mild acute distress  HEENT:  EOMI CARDIOVASCULAR:  RRR, no murmurs appreciated RESPIRATORY:  Clear to auscultation, no wheezing, rales, or rhonchi GASTROINTESTINAL:  Soft, epigastric tenderness, nondistended EXTREMITIES:  No LE edema bilaterally NEURO:  No new focal deficits appreciated SKIN:  No rashes noted PSYCH:  Appropriate mood and affect, anxious   Condition at discharge: improving  The results of significant diagnostics from this hospitalization (including imaging, microbiology, ancillary and laboratory) are listed below for reference.   Imaging Studies: CT ABDOMEN PELVIS W CONTRAST Result Date: 01/03/2024 EXAM: CT ABDOMEN AND PELVIS WITH CONTRAST 01/03/2024 03:27:38 PM TECHNIQUE: CT of the abdomen and pelvis was performed with the administration of 75 mL of iohexol  (OMNIPAQUE ) 300 MG/ML solution. Multiplanar reformatted images are provided for review. Automated exposure control, iterative reconstruction, and/or weight-based adjustment of the mA/kV was utilized to reduce the radiation dose to as low as reasonably achievable. COMPARISON:  12/29/2023 CLINICAL HISTORY: Abdominal pain, acute, nonlocalized. Previous Nissen fundoplication. FINDINGS: LOWER CHEST: Interval resolution of pleural effusions. Small pericardial effusion, slightly decreased. LIVER: The liver is unremarkable. GALLBLADDER AND BILE DUCTS: Cholecystectomy clips. No biliary ductal dilatation. SPLEEN: No acute abnormality. PANCREAS: No acute abnormality. ADRENAL GLANDS: No acute abnormality. KIDNEYS, URETERS AND BLADDER: No stones in the kidneys or ureters. No hydronephrosis. No perinephric or periureteral stranding. Urinary bladder is unremarkable. 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 There is no bowel obstruction. PERITONEUM AND RETROPERITONEUM: No ascites. No free air. VASCULATURE: Aorta is normal in caliber. Mild scattered iliac calcified plaque without aneurysm.  LYMPH NODES: No lymphadenopathy. REPRODUCTIVE ORGANS: Previous hysterectomy. BONES AND SOFT TISSUES: Lumbar levoscoliosis with multilevel spondylotic change. Left pelvic phleboliths. No acute osseous abnormality. No focal soft tissue abnormality. IMPRESSION: 1. 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 2. Interval resolution of pleural effusions. Small pericardial effusion, slightly decreased. Electronically signed by: Dayne Hassell MD 01/03/2024 03:39 PM EST RP Workstation: [id number]   DG Chest Port 1 View Result Date: 01/03/2024 CLINICAL DATA:  Shortness of breath and chest pain. EXAM: PORTABLE CHEST 1 VIEW COMPARISON:  12/28/2023 FINDINGS: Lungs are adequately inflated without effusion, focal airspace consolidation or pneumothorax. Moderate size hiatal hernia unchanged. Cardiomediastinal silhouette and remainder of the exam is unchanged. IMPRESSION: 1. No acute cardiopulmonary disease. 2. Moderate size hiatal hernia. Electronically Signed   By: Toribio Agreste M.D.   On: 01/03/2024 13:59   CT CHEST ABDOMEN PELVIS W CONTRAST Result Date: 12/29/2023 CLINICAL DATA:  Unintended weight loss, paraesophageal hernia, persistent chest and abdominal pain * Tracking Code: BO * EXAM: CT CHEST, ABDOMEN, AND PELVIS WITH CONTRAST TECHNIQUE: Multidetector CT imaging of the chest, abdomen and pelvis was performed following the standard protocol during bolus administration of intravenous contrast. RADIATION DOSE REDUCTION: This exam was performed according to the departmental dose-optimization program which includes automated exposure control, adjustment of the mA and/or kV according to patient size and/or use of iterative reconstruction technique. CONTRAST:  OMNIPAQUE  IOHEXOL  300 MG/ML  SOLN COMPARISON:  CT abdomen pelvis, 12/23/2023, CT chest angiogram, 11/25/2023 FINDINGS: CT CHEST FINDINGS Cardiovascular: Aortic atherosclerosis. Normal heart size. Unchanged small pericardial  effusion. Mediastinum/Nodes: No enlarged mediastinal, hilar, or axillary lymph nodes. Large hiatal hernia with intrathoracic position of the gastric fundus; appearance suggests prior fundoplication. Thyroid gland, trachea, and esophagus demonstrate no significant findings. Lungs/Pleura: Moderate left, small right pleural effusions and associated atelectasis or consolidation. Mild centrilobular emphysema. Musculoskeletal: No chest wall abnormality. No acute osseous findings. CT ABDOMEN PELVIS FINDINGS Hepatobiliary: No focal liver abnormality is seen. Status post cholecystectomy. Postoperative biliary ductal dilatation. Pancreas: Unremarkable. No pancreatic ductal dilatation or surrounding inflammatory changes. Spleen: Normal in size without significant abnormality. Adrenals/Urinary Tract: Adrenal glands are unremarkable. Kidneys are normal, without renal calculi, solid lesion, or hydronephrosis. Bladder is unremarkable. Stomach/Bowel: Stomach is within normal limits. Appendix not clearly visualized. Long segment colonic wall thickening and mucosal hyperenhancement involving multiple segments of colon from the cecum to the rectum, with multiple interposed segments of sparing. Vascular/Lymphatic: Aortic atherosclerosis. No enlarged abdominal or pelvic lymph nodes. Reproductive: Hysterectomy. Other: No abdominal wall hernia or abnormality. No ascites. Musculoskeletal: No acute osseous findings. IMPRESSION: 1. Long segment colonic wall thickening and mucosal hyperenhancement involving multiple segments of colon from the cecum to the rectum, with multiple interposed segments of sparing. Findings are consistent with nonspecific infectious, inflammatory, or ischemic colitis, appearance of skip lesions suggesting Crohn's colitis. 2. Moderate left, small right pleural effusions  and associated atelectasis or consolidation. 3. Large hiatal hernia with intrathoracic position of the gastric fundus; appearance suggests prior  fundoplication. 4. Emphysema. 5. Cholecystectomy. Aortic Atherosclerosis (ICD10-I70.0) and Emphysema (ICD10-J43.9). Electronically Signed   By: Marolyn JONETTA Jaksch M.D.   On: 12/29/2023 16:13   DG Abd 2 Views Result Date: 12/28/2023 CLINICAL DATA:  855384 Pain [id number] EXAM: ABDOMEN - 2 VIEW COMPARISON:  Correct over sixteenth 2025, December 01, 2023. FINDINGS: Air and stool-filled nondilated loops of bowel. Mildly edematous appearance of the walls of loops of small bowel. Mild colonic stool burden diffusely throughout the colon. Visualized lung bases are unremarkable. Favored hiatal hernia. Degenerative changes of the lumbar spine. Status post cholecystectomy. Levoscoliosis of the lumbar spine IMPRESSION: 1. Nonobstructive bowel gas pattern. 2. Mildly edematous appearance of the walls of loops of small bowel. This is nonspecific but could reflect enteritis. Electronically Signed   By: Corean Salter M.D.   On: 12/28/2023 10:46   DG Chest 2 View Result Date: 12/28/2023 CLINICAL DATA:  Chest pain EXAM: DG CHEST 2V COMPARISON:  December 04, 2023 FINDINGS: The cardiomediastinal silhouette is unchanged in contour.Rounded density over the midline likely reflecting a large hiatal hernia. Atherosclerotic calcifications of the aorta. No pleural effusion. No pneumothorax. No acute pleuroparenchymal abnormality. Lucency projecting anterior to the the xiphoid process on lateral radiograph could reflect summation artifact versus a ventral hernia. Multilevel degenerative changes of the thoracic spine. IMPRESSION: 1. No acute cardiopulmonary abnormality. Favored underlying large hiatal hernia 2. Lucency projecting anterior to the xiphoid process on lateral radiograph could reflect summation artifact versus a ventral hernia. Recommend correlation with physical exam. Electronically Signed   By: Corean Salter M.D.   On: 12/28/2023 10:43   CT ABDOMEN PELVIS W CONTRAST Result Date: 12/23/2023 EXAM: CT ABDOMEN AND PELVIS WITH  CONTRAST 12/23/2023 02:41:11 PM TECHNIQUE: CT of the abdomen and pelvis was performed with the administration of 100 mL of iohexol  (OMNIPAQUE ) 300 MG/ML solution. Multiplanar reformatted images are provided for review. Automated exposure control, iterative reconstruction, and/or weight-based adjustment of the mA/kV was utilized to reduce the radiation dose to as low as reasonably achievable. COMPARISON: CT Abd-pelv W/ 11/25/2023 and 12/04/2023. CLINICAL HISTORY: Upper abdominal pain with intractable nausea and vomiting. FINDINGS: LOWER CHEST: Small left pleural effusion. Left lower lobe subsegmental atelectasis. Small pericardial effusion is similar to 12/04/2023. There is adjacent free fluid in the posterior mediastinum. LIVER: The liver is unremarkable. GALLBLADDER AND BILE DUCTS: Cholecystectomy. No biliary ductal dilatation. SPLEEN: No acute abnormality. PANCREAS: No acute abnormality. ADRENAL GLANDS: Stable adrenal glands. KIDNEYS, URETERS AND BLADDER: No stones in the kidneys or ureters. No hydronephrosis. No perinephric or periureteral stranding. Urinary bladder is unremarkable. GI AND BOWEL: Postoperative change of Nissen fundoplication. Moderate hiatal hernia containing the fundoplication. Wall thickening about the distal esophagus is partially visualized and may in part be postoperative however esophagitis is not excluded. Stomach demonstrates no acute abnormality. There is no bowel obstruction. Normal appendix. PERITONEUM AND RETROPERITONEUM: No ascites. No free air. VASCULATURE: Aorta is normal in caliber. Aortic atherosclerotic calcification. LYMPH NODES: No lymphadenopathy. REPRODUCTIVE ORGANS: No acute abnormality. BONES AND SOFT TISSUES: No acute osseous abnormality. No focal soft tissue abnormality. IMPRESSION: 1. Postoperative change of Nissen fundoplication with moderate hiatal hernia containing the fundoplication; distal esophageal wall thickening may be postoperative, however esophagitis is not  excluded. Adjacent free fluid in the posterior mediastinum. Consider CT of the chest with IV contrast and 100 ml oral contrast administered a few minutes prior to the exam to  better evaluate the hernia and fundoplication. 2. Small left pleural effusion with left lower lobe subsegmental atelectasis, decreased from 10 / 16 / 25. 3. Small pericardial effusion, similar to 12/04/23. Electronically signed by: Norman Gatlin MD 12/23/2023 07:35 PM EST RP Workstation: [id number]   DG Chest 2 View Result Date: 12/21/2023 EXAM: 2 VIEW(S) XRAY OF THE CHEST 12/21/2023 02:47:01 PM COMPARISON: Comparison 9 days ago. CLINICAL HISTORY: chest pain chest pain FINDINGS: LUNGS AND PLEURA: Increased left basilar opacity is noted concerning for worsening pneumonia or atelectasis with associated effusion. Minimal right pleural effusion is noted. No pulmonary edema. No pneumothorax. HEART AND MEDIASTINUM: No acute abnormality of the cardiac and mediastinal silhouettes. BONES AND SOFT TISSUES: No acute osseous abnormality. IMPRESSION: 1. Worsening left basilar opacity with associated effusion, favoring pneumonia versus atelectasis. 2. Minimal right pleural effusion. Electronically signed by: Lynwood Seip MD 12/21/2023 02:49 PM EST RP Workstation: [id number]   ECHOCARDIOGRAM COMPLETE Result Date: 12/12/2023    ECHOCARDIOGRAM REPORT   Patient Name:   WYONA NEILS Dewoody Date of Exam: 12/12/2023 Medical Rec #:  [id number]         Height:       59.0 in Accession #:    [id number]        Weight:       103.0 lb Date of Birth:  December 03, 1960          BSA:          1.391 m Patient Age:    63 years          BP:           139/83 mmHg Patient Gender: F                 HR:           107 bpm. Exam Location:  [facility] Procedure: 2D Echo, Strain Analysis, Cardiac Doppler and Color Doppler (Both            Spectral and Color Flow Doppler were utilized during procedure). Indications:    CHF- Acute Diastolic I50.31  History:        Patient has no prior  history of Echocardiogram examinations.                 Signs/Symptoms:Chest Pain; Risk Factors:Hypertension,                 Pre-diabetes, Dyslipidemia and Current Smoker.  Sonographer:    Koleen Popper RDCS Referring Phys: [phone] CARLOS MADERA  Sonographer Comments: Image acquisition challenging due to respiratory motion. Global longitudinal strain was attempted. IMPRESSIONS  1. Left ventricular ejection fraction, by estimation, is >75%. The left ventricle has hyperdynamic function. The left ventricle has no regional wall motion abnormalities. Left ventricular diastolic parameters are consistent with Grade I diastolic dysfunction (impaired relaxation). The average left ventricular global longitudinal strain is -18.9 %. The global longitudinal strain is normal.  2. Right ventricular systolic function is normal. The right ventricular size is normal. Tricuspid regurgitation signal is inadequate for assessing PA pressure.  3. Moderate pericardial effusion is anterior to the right ventricle. Small pericardial effusion is lateral to LV and localized along RA. There is no evidence of cardiac tamponade.  4. The mitral valve is normal in structure. No evidence of mitral valve regurgitation. No evidence of mitral stenosis.  5. The aortic valve is tricuspid. Aortic valve regurgitation is not visualized. No aortic stenosis is present.  6. The inferior vena cava is normal in size with greater than  50% respiratory variability, suggesting right atrial pressure of 3 mmHg. Comparison(s): No prior Echocardiogram. FINDINGS  Left Ventricle: Left ventricular ejection fraction, by estimation, is >75%. The left ventricle has hyperdynamic function. The left ventricle has no regional wall motion abnormalities. The average left ventricular global longitudinal strain is -18.9 %. Strain was performed and the global longitudinal strain is normal. The left ventricular internal cavity size was normal in size. There is no left ventricular  hypertrophy. Left ventricular diastolic parameters are consistent with Grade I diastolic dysfunction (impaired relaxation). Normal left ventricular filling pressure. Right Ventricle: The right ventricular size is normal. No increase in right ventricular wall thickness. Right ventricular systolic function is normal. Tricuspid regurgitation signal is inadequate for assessing PA pressure. Left Atrium: Left atrial size was normal in size. Right Atrium: Right atrial size was normal in size. Pericardium: A moderately sized pericardial effusion is present. The pericardial effusion is anterior to the right ventricle. There is no evidence of cardiac tamponade. Mitral Valve: The mitral valve is normal in structure. No evidence of mitral valve regurgitation. No evidence of mitral valve stenosis. Tricuspid Valve: The tricuspid valve is normal in structure. Tricuspid valve regurgitation is not demonstrated. No evidence of tricuspid stenosis. Aortic Valve: The aortic valve is tricuspid. Aortic valve regurgitation is not visualized. No aortic stenosis is present. Pulmonic Valve: The pulmonic valve was grossly normal. Pulmonic valve regurgitation is not visualized. No evidence of pulmonic stenosis. Aorta: The aortic root and ascending aorta are structurally normal, with no evidence of dilitation. Venous: The inferior vena cava is normal in size with greater than 50% respiratory variability, suggesting right atrial pressure of 3 mmHg. IAS/Shunts: No atrial level shunt detected by color flow Doppler. Additional Comments: 3D was performed not requiring image post processing on an independent workstation and was indeterminate.  LEFT VENTRICLE PLAX 2D LVIDd:         3.40 cm   Diastology LVIDs:         2.00 cm   LV e' medial:    5.44 cm/s LV PW:         1.10 cm   LV E/e' medial:  12.9 LV IVS:        1.10 cm   LV e' lateral:   9.25 cm/s LVOT diam:     1.80 cm   LV E/e' lateral: 7.6 LV SV:         47 LV SV Index:   34        2D  Longitudinal Strain LVOT Area:     2.54 cm  2D Strain GLS Avg:     -18.9 %  RIGHT VENTRICLE             IVC RV Basal diam:  2.60 cm     IVC diam: 1.00 cm RV S prime:     20.00 cm/s TAPSE (M-mode): 2.2 cm LEFT ATRIUM             Index        RIGHT ATRIUM          Index LA diam:        2.70 cm 1.94 cm/m   RA Area:     8.90 cm LA Vol (A2C):   28.1 ml 20.20 ml/m  RA Volume:   14.30 ml 10.28 ml/m LA Vol (A4C):   32.5 ml 23.36 ml/m LA Biplane Vol: 31.7 ml 22.79 ml/m  AORTIC VALVE LVOT Vmax:   111.00 cm/s LVOT Vmean:  72.800 cm/s LVOT VTI:  0.184 m  AORTA Ao Root diam: 3.00 cm Ao Asc diam:  2.90 cm MITRAL VALVE MV Area (PHT): 5.46 cm     SHUNTS MV Decel Time: 139 msec     Systemic VTI:  0.18 m MV E velocity: 70.20 cm/s   Systemic Diam: 1.80 cm MV A velocity: 112.00 cm/s MV E/A ratio:  0.63 Vishnu Priya Mallipeddi Electronically signed by Diannah Late Mallipeddi Signature Date/Time: 12/12/2023/4:59:17 PM    Final    DG Chest Port 1 View Result Date: 12/12/2023 EXAM: 1 VIEW(S) XRAY OF THE CHEST 12/12/2023 04:42:00 AM COMPARISON: 12/01/2023 CLINICAL HISTORY: CP, HTN, leg swelling. CP, HTN, leg/foot swelling and pain, elevated BP @ time of imaging (203/93), painful stomach palpitations. FINDINGS: LUNGS AND PLEURA: Left base atelectasis. Mild right basilar atelectasis or scarring. No pulmonary edema. No pleural effusion. Skin fold artifact right chest. No pneumothorax. HEART AND MEDIASTINUM: Aortic calcification. Moderate chronic hiatal hernia. BONES AND SOFT TISSUES: Cervical spine surgical hardware noted. No acute osseous abnormality. IMPRESSION: 1. No acute cardiopulmonary abnormality ;minor lung base atelectasis. 2. Moderate chronic hiatal hernia. Electronically signed by: Helayne Hurst MD 12/12/2023 05:58 AM EDT RP Workstation: [id number]    Microbiology: Results for orders placed or performed during the hospital encounter of 11/25/23  Resp panel by RT-PCR (RSV, Flu A&B, Covid) Anterior Nasal Swab      Status: None   Collection Time: 11/25/23  8:07 PM   Specimen: Anterior Nasal Swab  Result Value Ref Range Status   SARS Coronavirus 2 by RT PCR NEGATIVE NEGATIVE Final    Comment: (NOTE) SARS-CoV-2 target nucleic acids are NOT DETECTED.  The SARS-CoV-2 RNA is generally detectable in upper respiratory specimens during the acute phase of infection. The lowest concentration of SARS-CoV-2 viral copies this assay can detect is 138 copies/mL. A negative result does not preclude SARS-Cov-2 infection and should not be used as the sole basis for treatment or other patient management decisions. A negative result may occur with  improper specimen collection/handling, submission of specimen other than nasopharyngeal swab, presence of viral mutation(s) within the areas targeted by this assay, and inadequate number of viral copies(<138 copies/mL). A negative result must be combined with clinical observations, patient history, and epidemiological information. The expected result is Negative.  Fact Sheet for Patients:  bloggercourse.com  Fact Sheet for Healthcare Providers:  seriousbroker.it  This test is no t yet approved or cleared by the [place]  FDA and  has been authorized for detection and/or diagnosis of SARS-CoV-2 by FDA under an Emergency Use Authorization (EUA). This EUA will remain  in effect (meaning this test can be used) for the duration of the COVID-19 declaration under Section 564(b)(1) of the Act, 21 U.S.C.section 360bbb-3(b)(1), unless the authorization is terminated  or revoked sooner.       Influenza A by PCR NEGATIVE NEGATIVE Final   Influenza B by PCR NEGATIVE NEGATIVE Final    Comment: (NOTE) The Xpert Xpress SARS-CoV-2/FLU/RSV plus assay is intended as an aid in the diagnosis of influenza from Nasopharyngeal swab specimens and should not be used as a sole basis for treatment. Nasal washings and aspirates are  unacceptable for Xpert Xpress SARS-CoV-2/FLU/RSV testing.  Fact Sheet for Patients: bloggercourse.com  Fact Sheet for Healthcare Providers: seriousbroker.it  This test is not yet approved or cleared by the [place]  FDA and has been authorized for detection and/or diagnosis of SARS-CoV-2 by FDA under an Emergency Use Authorization (EUA). This EUA will remain in effect (meaning this test can be  used) for the duration of the COVID-19 declaration under Section 564(b)(1) of the Act, 21 U.S.C. section 360bbb-3(b)(1), unless the authorization is terminated or revoked.     Resp Syncytial Virus by PCR NEGATIVE NEGATIVE Final    Comment: (NOTE) Fact Sheet for Patients: bloggercourse.com  Fact Sheet for Healthcare Providers: seriousbroker.it  This test is not yet approved or cleared by the [place]  FDA and has been authorized for detection and/or diagnosis of SARS-CoV-2 by FDA under an Emergency Use Authorization (EUA). This EUA will remain in effect (meaning this test can be used) for the duration of the COVID-19 declaration under Section 564(b)(1) of the Act, 21 U.S.C. section 360bbb-3(b)(1), unless the authorization is terminated or revoked.  Performed at [facility], [address] [place], [place] [address]   Blood Culture (routine x 2)     Status: None   Collection Time: 11/25/23  8:08 PM   Specimen: BLOOD  Result Value Ref Range Status   Specimen Description BLOOD LEFT ANTECUBITAL  Final   Special Requests   Final    BOTTLES DRAWN AEROBIC AND ANAEROBIC Blood Culture adequate volume   Culture   Final    NO GROWTH 5 DAYS Performed at [facility], [address] [place], [place] [address]    Report Status 11/30/2023 FINAL  Final  Blood Culture (routine x 2)     Status: None   Collection Time: 11/25/23  9:34 PM   Specimen: BLOOD  Result Value Ref Range Status    Specimen Description BLOOD RIGHT ANTECUBITAL  Final   Special Requests   Final    BOTTLES DRAWN AEROBIC AND ANAEROBIC Blood Culture adequate volume   Culture   Final    NO GROWTH 5 DAYS Performed at [facility], [address] [place], [place] [address]    Report Status 11/30/2023 FINAL  Final    Labs: CBC: Recent Labs  Lab 01/01/24 0427 01/03/24 1324 01/03/24 1336 01/04/24 0419 01/05/24 0409 01/06/24 0431  WBC 9.3 15.8*  --  11.2* 10.5 10.8*  HGB 11.3* 14.0 14.6 11.4* 11.6* 11.0*  HCT 35.5* 42.8 43.0 35.5* 38.5 35.6*  MCV 83.1 81.2  --  83.7 87.5 85.4  PLT 702* 893*  --  688* 634* 631*   Basic Metabolic Panel: Recent Labs  Lab 01/01/24 0427 01/03/24 1324 01/03/24 1336 01/04/24 0419 01/05/24 0409 01/06/24 0431  NA 140 141 140 141 141 140  K 3.3* 2.9* 2.9* 2.8* 4.9 4.0  CL 105 103 105 106 108 105  CO2 22 21*  --  24 25 26   GLUCOSE 105* 119* 121* 102* 86 97  BUN 7* 18 19 16 14 10   CREATININE 0.55 1.34* 1.30* 0.88 0.76 0.66  CALCIUM  9.2 10.2  --  9.1 9.3 9.5  MG 2.0 2.2  --  2.2 2.1 2.1  PHOS 2.9  --   --  3.1  --   --    Liver Function Tests: Recent Labs  Lab 01/03/24 1342 01/04/24 0419  AST 16 11*  ALT <5 <5  ALKPHOS 88 72  BILITOT 0.2 <0.2  PROT 7.5 5.9*  ALBUMIN 4.2 3.4*   CBG: No results for input(s): GLUCAP in the last 168 hours.  Discharge time spent: 25 minutes.  Length of inpatient stay: 3 days  Signed: Carliss LELON Canales, DO [facility] 01/06/2024

## 2024-01-06 NOTE — Plan of Care (Signed)
  Problem: Clinical Measurements: Goal: Will remain free from infection Outcome: Progressing Goal: Diagnostic test results will improve Outcome: Progressing Goal: Cardiovascular complication will be avoided Outcome: Progressing   Problem: Coping: Goal: Level of anxiety will decrease Outcome: Progressing   Problem: Pain Managment: Goal: General experience of comfort will improve and/or be controlled Outcome: Progressing   Problem: Safety: Goal: Ability to remain free from injury will improve Outcome: Progressing   Problem: Skin Integrity: Goal: Risk for impaired skin integrity will decrease Outcome: Progressing

## 2024-01-06 NOTE — Progress Notes (Signed)
 Patient given discharge handout and informed that GI office would call with an appointment.

## 2024-01-06 NOTE — Progress Notes (Addendum)
 Pt was upset with Nurse Alexandro Ip, for not allowing her to have ice cream. Ip confirmed with Charge Nurse Earla Gully, pt on a Full Liquid Thin diet. Thin liquids were offered by this nurse, however pt declined. Pt stated the day shift nursing staff provided her with ice cream all day yesterday. Informed pt I would speak to the charge nurse regarding the issue. Pt declined for me to speak on her behalf. Felt nursing staff on day shift was different from night shift and both shifts have told her something different.   This nurse verbally read out loud the attending doctors order written in place on pts Mar. Offered pt clear broth, pt stated she didn't like the broth and did not want it. Apologized to pt, advised pt to ring call light if she decided she changed her mind. Pt stated no, it is not worth the hassle. I will have my husband bring me something in the morning.

## 2024-01-07 LAB — SURGICAL PATHOLOGY

## 2024-01-08 NOTE — Telephone Encounter (Signed)
 Patient was originally schd 12/26/23 and was NS, I called and reschd her apt to 01/27/24 10 am and gave her the number to call to cancel or reschd if she can't make the apt and I expressed to importance of this as they will not continue to schd apts if she NS for them

## 2024-01-09 ENCOUNTER — Encounter (HOSPITAL_COMMUNITY): Payer: Self-pay | Admitting: Internal Medicine

## 2024-01-10 ENCOUNTER — Emergency Department (HOSPITAL_COMMUNITY)

## 2024-01-10 ENCOUNTER — Inpatient Hospital Stay (HOSPITAL_COMMUNITY)

## 2024-01-10 ENCOUNTER — Inpatient Hospital Stay (HOSPITAL_COMMUNITY)
Admission: EM | Admit: 2024-01-10 | Discharge: 2024-02-19 | DRG: 870 | Disposition: E | Attending: Pulmonary Disease | Admitting: Pulmonary Disease

## 2024-01-10 ENCOUNTER — Other Ambulatory Visit: Payer: Self-pay

## 2024-01-10 ENCOUNTER — Other Ambulatory Visit (HOSPITAL_COMMUNITY): Payer: Self-pay | Admitting: *Deleted

## 2024-01-10 DIAGNOSIS — I5031 Acute diastolic (congestive) heart failure: Secondary | ICD-10-CM | POA: Diagnosis not present

## 2024-01-10 DIAGNOSIS — R52 Pain, unspecified: Secondary | ICD-10-CM

## 2024-01-10 DIAGNOSIS — R410 Disorientation, unspecified: Secondary | ICD-10-CM | POA: Diagnosis not present

## 2024-01-10 DIAGNOSIS — I130 Hypertensive heart and chronic kidney disease with heart failure and stage 1 through stage 4 chronic kidney disease, or unspecified chronic kidney disease: Secondary | ICD-10-CM | POA: Diagnosis present

## 2024-01-10 DIAGNOSIS — Z515 Encounter for palliative care: Secondary | ICD-10-CM | POA: Diagnosis not present

## 2024-01-10 DIAGNOSIS — Z79891 Long term (current) use of opiate analgesic: Secondary | ICD-10-CM

## 2024-01-10 DIAGNOSIS — K257 Chronic gastric ulcer without hemorrhage or perforation: Secondary | ICD-10-CM | POA: Diagnosis present

## 2024-01-10 DIAGNOSIS — Z66 Do not resuscitate: Secondary | ICD-10-CM | POA: Diagnosis present

## 2024-01-10 DIAGNOSIS — K255 Chronic or unspecified gastric ulcer with perforation: Secondary | ICD-10-CM | POA: Diagnosis present

## 2024-01-10 DIAGNOSIS — H5702 Anisocoria: Secondary | ICD-10-CM | POA: Diagnosis present

## 2024-01-10 DIAGNOSIS — Z5941 Food insecurity: Secondary | ICD-10-CM

## 2024-01-10 DIAGNOSIS — Z9049 Acquired absence of other specified parts of digestive tract: Secondary | ICD-10-CM

## 2024-01-10 DIAGNOSIS — I959 Hypotension, unspecified: Secondary | ICD-10-CM | POA: Diagnosis not present

## 2024-01-10 DIAGNOSIS — I2510 Atherosclerotic heart disease of native coronary artery without angina pectoris: Secondary | ICD-10-CM | POA: Diagnosis present

## 2024-01-10 DIAGNOSIS — B49 Unspecified mycosis: Secondary | ICD-10-CM | POA: Diagnosis not present

## 2024-01-10 DIAGNOSIS — J9312 Secondary spontaneous pneumothorax: Secondary | ICD-10-CM | POA: Diagnosis present

## 2024-01-10 DIAGNOSIS — J939 Pneumothorax, unspecified: Secondary | ICD-10-CM

## 2024-01-10 DIAGNOSIS — E785 Hyperlipidemia, unspecified: Secondary | ICD-10-CM | POA: Diagnosis present

## 2024-01-10 DIAGNOSIS — A419 Sepsis, unspecified organism: Secondary | ICD-10-CM | POA: Diagnosis present

## 2024-01-10 DIAGNOSIS — R54 Age-related physical debility: Secondary | ICD-10-CM | POA: Diagnosis present

## 2024-01-10 DIAGNOSIS — Z8679 Personal history of other diseases of the circulatory system: Secondary | ICD-10-CM

## 2024-01-10 DIAGNOSIS — Z7189 Other specified counseling: Secondary | ICD-10-CM

## 2024-01-10 DIAGNOSIS — Z86018 Personal history of other benign neoplasm: Secondary | ICD-10-CM

## 2024-01-10 DIAGNOSIS — Z5948 Other specified lack of adequate food: Secondary | ICD-10-CM

## 2024-01-10 DIAGNOSIS — A491 Streptococcal infection, unspecified site: Secondary | ICD-10-CM | POA: Insufficient documentation

## 2024-01-10 DIAGNOSIS — D696 Thrombocytopenia, unspecified: Secondary | ICD-10-CM | POA: Diagnosis not present

## 2024-01-10 DIAGNOSIS — D649 Anemia, unspecified: Secondary | ICD-10-CM | POA: Diagnosis present

## 2024-01-10 DIAGNOSIS — E871 Hypo-osmolality and hyponatremia: Secondary | ICD-10-CM | POA: Diagnosis present

## 2024-01-10 DIAGNOSIS — K440 Diaphragmatic hernia with obstruction, without gangrene: Secondary | ICD-10-CM | POA: Diagnosis present

## 2024-01-10 DIAGNOSIS — Z881 Allergy status to other antibiotic agents status: Secondary | ICD-10-CM

## 2024-01-10 DIAGNOSIS — J9601 Acute respiratory failure with hypoxia: Secondary | ICD-10-CM | POA: Diagnosis present

## 2024-01-10 DIAGNOSIS — K223 Perforation of esophagus: Secondary | ICD-10-CM | POA: Diagnosis present

## 2024-01-10 DIAGNOSIS — Z7401 Bed confinement status: Secondary | ICD-10-CM | POA: Diagnosis not present

## 2024-01-10 DIAGNOSIS — R7881 Bacteremia: Secondary | ICD-10-CM | POA: Diagnosis not present

## 2024-01-10 DIAGNOSIS — J869 Pyothorax without fistula: Secondary | ICD-10-CM | POA: Diagnosis present

## 2024-01-10 DIAGNOSIS — W19XXXA Unspecified fall, initial encounter: Secondary | ICD-10-CM | POA: Diagnosis not present

## 2024-01-10 DIAGNOSIS — Z90710 Acquired absence of both cervix and uterus: Secondary | ICD-10-CM

## 2024-01-10 DIAGNOSIS — I4891 Unspecified atrial fibrillation: Secondary | ICD-10-CM | POA: Diagnosis not present

## 2024-01-10 DIAGNOSIS — R451 Restlessness and agitation: Secondary | ICD-10-CM

## 2024-01-10 DIAGNOSIS — L89316 Pressure-induced deep tissue damage of right buttock: Secondary | ICD-10-CM | POA: Diagnosis not present

## 2024-01-10 DIAGNOSIS — R579 Shock, unspecified: Secondary | ICD-10-CM

## 2024-01-10 DIAGNOSIS — L89116 Pressure-induced deep tissue damage of right upper back: Secondary | ICD-10-CM | POA: Diagnosis not present

## 2024-01-10 DIAGNOSIS — J80 Acute respiratory distress syndrome: Secondary | ICD-10-CM | POA: Diagnosis not present

## 2024-01-10 DIAGNOSIS — F419 Anxiety disorder, unspecified: Secondary | ICD-10-CM | POA: Diagnosis present

## 2024-01-10 DIAGNOSIS — E43 Unspecified severe protein-calorie malnutrition: Secondary | ICD-10-CM | POA: Diagnosis present

## 2024-01-10 DIAGNOSIS — N179 Acute kidney failure, unspecified: Principal | ICD-10-CM | POA: Diagnosis present

## 2024-01-10 DIAGNOSIS — Z882 Allergy status to sulfonamides status: Secondary | ICD-10-CM

## 2024-01-10 DIAGNOSIS — N182 Chronic kidney disease, stage 2 (mild): Secondary | ICD-10-CM | POA: Diagnosis present

## 2024-01-10 DIAGNOSIS — E870 Hyperosmolality and hypernatremia: Secondary | ICD-10-CM | POA: Diagnosis not present

## 2024-01-10 DIAGNOSIS — R627 Adult failure to thrive: Secondary | ICD-10-CM | POA: Diagnosis present

## 2024-01-10 DIAGNOSIS — R64 Cachexia: Secondary | ICD-10-CM | POA: Diagnosis present

## 2024-01-10 DIAGNOSIS — Z888 Allergy status to other drugs, medicaments and biological substances status: Secondary | ICD-10-CM

## 2024-01-10 DIAGNOSIS — K219 Gastro-esophageal reflux disease without esophagitis: Secondary | ICD-10-CM | POA: Diagnosis present

## 2024-01-10 DIAGNOSIS — J189 Pneumonia, unspecified organism: Secondary | ICD-10-CM | POA: Diagnosis present

## 2024-01-10 DIAGNOSIS — G9340 Encephalopathy, unspecified: Secondary | ICD-10-CM | POA: Diagnosis not present

## 2024-01-10 DIAGNOSIS — J9382 Other air leak: Secondary | ICD-10-CM | POA: Diagnosis not present

## 2024-01-10 DIAGNOSIS — K589 Irritable bowel syndrome without diarrhea: Secondary | ICD-10-CM | POA: Diagnosis present

## 2024-01-10 DIAGNOSIS — A4181 Sepsis due to Enterococcus: Principal | ICD-10-CM | POA: Diagnosis present

## 2024-01-10 DIAGNOSIS — J439 Emphysema, unspecified: Secondary | ICD-10-CM | POA: Diagnosis present

## 2024-01-10 DIAGNOSIS — G9341 Metabolic encephalopathy: Secondary | ICD-10-CM | POA: Diagnosis present

## 2024-01-10 DIAGNOSIS — J440 Chronic obstructive pulmonary disease with acute lower respiratory infection: Secondary | ICD-10-CM | POA: Diagnosis present

## 2024-01-10 DIAGNOSIS — B952 Enterococcus as the cause of diseases classified elsewhere: Secondary | ICD-10-CM | POA: Diagnosis not present

## 2024-01-10 DIAGNOSIS — E874 Mixed disorder of acid-base balance: Secondary | ICD-10-CM | POA: Diagnosis not present

## 2024-01-10 DIAGNOSIS — Z1152 Encounter for screening for COVID-19: Secondary | ICD-10-CM

## 2024-01-10 DIAGNOSIS — K449 Diaphragmatic hernia without obstruction or gangrene: Secondary | ICD-10-CM

## 2024-01-10 DIAGNOSIS — Z781 Physical restraint status: Secondary | ICD-10-CM

## 2024-01-10 DIAGNOSIS — F17210 Nicotine dependence, cigarettes, uncomplicated: Secondary | ICD-10-CM | POA: Diagnosis not present

## 2024-01-10 DIAGNOSIS — B3789 Other sites of candidiasis: Secondary | ICD-10-CM | POA: Diagnosis not present

## 2024-01-10 DIAGNOSIS — L89156 Pressure-induced deep tissue damage of sacral region: Secondary | ICD-10-CM | POA: Diagnosis not present

## 2024-01-10 DIAGNOSIS — J449 Chronic obstructive pulmonary disease, unspecified: Secondary | ICD-10-CM | POA: Diagnosis present

## 2024-01-10 DIAGNOSIS — M797 Fibromyalgia: Secondary | ICD-10-CM | POA: Diagnosis present

## 2024-01-10 DIAGNOSIS — J90 Pleural effusion, not elsewhere classified: Secondary | ICD-10-CM | POA: Diagnosis not present

## 2024-01-10 DIAGNOSIS — K279 Peptic ulcer, site unspecified, unspecified as acute or chronic, without hemorrhage or perforation: Secondary | ICD-10-CM

## 2024-01-10 DIAGNOSIS — I5032 Chronic diastolic (congestive) heart failure: Secondary | ICD-10-CM | POA: Diagnosis present

## 2024-01-10 DIAGNOSIS — E8721 Acute metabolic acidosis: Secondary | ICD-10-CM

## 2024-01-10 DIAGNOSIS — R6521 Severe sepsis with septic shock: Secondary | ICD-10-CM | POA: Diagnosis present

## 2024-01-10 DIAGNOSIS — M1990 Unspecified osteoarthritis, unspecified site: Secondary | ICD-10-CM | POA: Diagnosis present

## 2024-01-10 DIAGNOSIS — J690 Pneumonitis due to inhalation of food and vomit: Secondary | ICD-10-CM | POA: Diagnosis present

## 2024-01-10 DIAGNOSIS — L89322 Pressure ulcer of left buttock, stage 2: Secondary | ICD-10-CM | POA: Diagnosis not present

## 2024-01-10 DIAGNOSIS — Z79899 Other long term (current) drug therapy: Secondary | ICD-10-CM

## 2024-01-10 DIAGNOSIS — B379 Candidiasis, unspecified: Secondary | ICD-10-CM | POA: Diagnosis present

## 2024-01-10 DIAGNOSIS — Z6821 Body mass index (BMI) 21.0-21.9, adult: Secondary | ICD-10-CM

## 2024-01-10 DIAGNOSIS — E876 Hypokalemia: Secondary | ICD-10-CM | POA: Diagnosis present

## 2024-01-10 DIAGNOSIS — K9189 Other postprocedural complications and disorders of digestive system: Secondary | ICD-10-CM | POA: Diagnosis present

## 2024-01-10 DIAGNOSIS — Z885 Allergy status to narcotic agent status: Secondary | ICD-10-CM

## 2024-01-10 DIAGNOSIS — Z886 Allergy status to analgesic agent status: Secondary | ICD-10-CM

## 2024-01-10 DIAGNOSIS — J9383 Other pneumothorax: Secondary | ICD-10-CM | POA: Insufficient documentation

## 2024-01-10 DIAGNOSIS — Z1621 Resistance to vancomycin: Secondary | ICD-10-CM | POA: Diagnosis present

## 2024-01-10 DIAGNOSIS — E440 Moderate protein-calorie malnutrition: Secondary | ICD-10-CM | POA: Insufficient documentation

## 2024-01-10 LAB — ECHOCARDIOGRAM COMPLETE
AR max vel: 1.64 cm2
AV Peak grad: 12.1 mmHg
Ao pk vel: 1.74 m/s
Area-P 1/2: 2.95 cm2
Height: 59 in
S' Lateral: 2 cm
Weight: 1629.64 [oz_av]

## 2024-01-10 LAB — COMPREHENSIVE METABOLIC PANEL WITH GFR
ALT: 43 U/L (ref 0–44)
ALT: 43 U/L (ref 0–44)
AST: 172 U/L — ABNORMAL HIGH (ref 15–41)
AST: 155 U/L — ABNORMAL HIGH (ref 15–41)
Albumin: 3.3 g/dL — ABNORMAL LOW (ref 3.5–5.0)
Albumin: 2.6 g/dL — ABNORMAL LOW (ref 3.5–5.0)
Alkaline Phosphatase: 64 U/L (ref 38–126)
Alkaline Phosphatase: 56 U/L (ref 38–126)
Anion gap: 20 — ABNORMAL HIGH (ref 5–15)
Anion gap: 14 (ref 5–15)
BUN: 43 mg/dL — ABNORMAL HIGH (ref 8–23)
BUN: 44 mg/dL — ABNORMAL HIGH (ref 8–23)
CO2: 18 mmol/L — ABNORMAL LOW (ref 22–32)
CO2: 19 mmol/L — ABNORMAL LOW (ref 22–32)
Calcium: 9.1 mg/dL (ref 8.9–10.3)
Calcium: 8.3 mg/dL — ABNORMAL LOW (ref 8.9–10.3)
Chloride: 92 mmol/L — ABNORMAL LOW (ref 98–111)
Chloride: 97 mmol/L — ABNORMAL LOW (ref 98–111)
Creatinine, Ser: 4.24 mg/dL — ABNORMAL HIGH (ref 0.44–1.00)
Creatinine, Ser: 3.4 mg/dL — ABNORMAL HIGH (ref 0.44–1.00)
GFR, Estimated: 11 mL/min — ABNORMAL LOW (ref >60)
GFR, Estimated: 14 mL/min — ABNORMAL LOW (ref >60)
Glucose, Bld: 85 mg/dL (ref 70–99)
Glucose, Bld: 122 mg/dL — ABNORMAL HIGH (ref 70–99)
Potassium: 4.7 mmol/L (ref 3.5–5.1)
Potassium: 3.9 mmol/L (ref 3.5–5.1)
Sodium: 130 mmol/L — ABNORMAL LOW (ref 135–145)
Sodium: 131 mmol/L — ABNORMAL LOW (ref 135–145)
Total Bilirubin: 0.3 mg/dL (ref 0.0–1.2)
Total Bilirubin: 0.3 mg/dL (ref 0.0–1.2)
Total Protein: 5.8 g/dL — ABNORMAL LOW (ref 6.5–8.1)
Total Protein: 5.1 g/dL — ABNORMAL LOW (ref 6.5–8.1)

## 2024-01-10 LAB — MRSA NEXT GEN BY PCR, NASAL: MRSA by PCR Next Gen: NOT DETECTED

## 2024-01-10 LAB — CBC
HCT: 34.5 % — ABNORMAL LOW (ref 36.0–46.0)
Hemoglobin: 10.6 g/dL — ABNORMAL LOW (ref 12.0–15.0)
MCH: 27 pg (ref 26.0–34.0)
MCHC: 30.7 g/dL (ref 30.0–36.0)
MCV: 88 fL (ref 80.0–100.0)
Platelets: 442 K/uL — ABNORMAL HIGH (ref 150–400)
RBC: 3.92 MIL/uL (ref 3.87–5.11)
RDW: 15.9 % — ABNORMAL HIGH (ref 11.5–15.5)
WBC: 13.7 K/uL — ABNORMAL HIGH (ref 4.0–10.5)
nRBC: 0 % (ref 0.0–0.2)

## 2024-01-10 LAB — CBC WITH DIFFERENTIAL/PLATELET
Abs Immature Granulocytes: 0.01 K/uL (ref 0.00–0.07)
Basophils Absolute: 0 K/uL (ref 0.0–0.1)
Basophils Relative: 1 %
Eosinophils Absolute: 0 K/uL (ref 0.0–0.5)
Eosinophils Relative: 0 %
HCT: 34.2 % — ABNORMAL LOW (ref 36.0–46.0)
Hemoglobin: 10.9 g/dL — ABNORMAL LOW (ref 12.0–15.0)
Immature Granulocytes: 0 %
Lymphocytes Relative: 11 %
Lymphs Abs: 0.5 K/uL — ABNORMAL LOW (ref 0.7–4.0)
MCH: 26.8 pg (ref 26.0–34.0)
MCHC: 31.9 g/dL (ref 30.0–36.0)
MCV: 84 fL (ref 80.0–100.0)
Monocytes Absolute: 0.5 K/uL (ref 0.1–1.0)
Monocytes Relative: 13 %
Neutro Abs: 3.3 K/uL (ref 1.7–7.7)
Neutrophils Relative %: 75 %
Platelets: 342 K/uL (ref 150–400)
RBC: 4.07 MIL/uL (ref 3.87–5.11)
RDW: 15.9 % — ABNORMAL HIGH (ref 11.5–15.5)
WBC: 4.3 K/uL (ref 4.0–10.5)
nRBC: 0 % (ref 0.0–0.2)

## 2024-01-10 LAB — BLOOD GAS, ARTERIAL
Acid-base deficit: 13.6 mmol/L — ABNORMAL HIGH (ref 0.0–2.0)
Bicarbonate: 16.1 mmol/L — ABNORMAL LOW (ref 20.0–28.0)
Drawn by: 27016
O2 Saturation: 100 %
Patient temperature: 35.1
pCO2 arterial: 48 mmHg (ref 32–48)
pH, Arterial: 7.12 — CL (ref 7.35–7.45)
pO2, Arterial: 222 mmHg — ABNORMAL HIGH (ref 83–108)

## 2024-01-10 LAB — STREP PNEUMONIAE URINARY ANTIGEN: Strep Pneumo Urinary Antigen: NEGATIVE

## 2024-01-10 LAB — RESP PANEL BY RT-PCR (RSV, FLU A&B, COVID)  RVPGX2
Influenza A by PCR: NEGATIVE
Influenza B by PCR: NEGATIVE
Resp Syncytial Virus by PCR: NEGATIVE
SARS Coronavirus 2 by RT PCR: NEGATIVE

## 2024-01-10 LAB — GLUCOSE, PLEURAL OR PERITONEAL FLUID: Glucose, Fluid: <20 mg/dL

## 2024-01-10 LAB — RESPIRATORY PANEL BY PCR

## 2024-01-10 LAB — BODY FLUID CELL COUNT WITH DIFFERENTIAL

## 2024-01-10 LAB — PHOSPHORUS: Phosphorus: 8.8 mg/dL — ABNORMAL HIGH (ref 2.5–4.6)

## 2024-01-10 LAB — GLUCOSE, CAPILLARY
Glucose-Capillary: 116 mg/dL — ABNORMAL HIGH (ref 70–99)
Glucose-Capillary: 129 mg/dL — ABNORMAL HIGH (ref 70–99)

## 2024-01-10 LAB — LACTATE DEHYDROGENASE, PLEURAL OR PERITONEAL FLUID: LD, Fluid: >2500 U/L — ABNORMAL HIGH (ref 3–23)

## 2024-01-10 LAB — LIPASE, BLOOD: Lipase: 13 U/L (ref 11–51)

## 2024-01-10 LAB — PROTIME-INR
INR: 0.9 (ref 0.8–1.2)
Prothrombin Time: 12.6 s (ref 11.4–15.2)

## 2024-01-10 LAB — PROTEIN, PLEURAL OR PERITONEAL FLUID: Total protein, fluid: 3 g/dL

## 2024-01-10 LAB — LACTIC ACID, PLASMA
Lactic Acid, Venous: 1.6 mmol/L (ref 0.5–1.9)
Lactic Acid, Venous: 0.9 mmol/L (ref 0.5–1.9)
Lactic Acid, Venous: 2.2 mmol/L (ref 0.5–1.9)

## 2024-01-10 LAB — GRAM STAIN

## 2024-01-10 LAB — OSMOLALITY: Osmolality: 296 mosm/kg — ABNORMAL HIGH (ref 275–295)

## 2024-01-10 LAB — TROPONIN T, HIGH SENSITIVITY
Troponin T High Sensitivity: 67 ng/L — ABNORMAL HIGH (ref 0–19)
Troponin T High Sensitivity: 62 ng/L — ABNORMAL HIGH (ref 0–19)

## 2024-01-10 LAB — MAGNESIUM: Magnesium: 2.2 mg/dL (ref 1.7–2.4)

## 2024-01-10 MED ORDER — ORAL CARE MOUTH RINSE
15.0000 mL | OROMUCOSAL | Status: DC
  Administered 2024-01-10 – 2024-01-13 (×36): 15 mL via OROMUCOSAL

## 2024-01-10 MED ORDER — SODIUM CHLORIDE 3 % IN NEBU
4.0000 mL | INHALATION_SOLUTION | Freq: Three times a day (TID) | RESPIRATORY_TRACT | Status: AC
Start: 2024-01-10 — End: 2024-01-13
  Administered 2024-01-10 – 2024-01-13 (×7): 4 mL via RESPIRATORY_TRACT
  Filled 2024-01-10 (×7): qty 4

## 2024-01-10 MED ORDER — KETAMINE HCL 50 MG/5ML IJ SOSY: 10.0000 mg | PREFILLED_SYRINGE | Freq: Once | INTRAMUSCULAR | Status: DC

## 2024-01-10 MED ORDER — ORAL CARE MOUTH RINSE
15.0000 mL | OROMUCOSAL | Status: DC | PRN
Start: 2024-01-10 — End: 2024-01-13

## 2024-01-10 MED ORDER — KETAMINE HCL 50 MG/5ML IJ SOSY: 10.0000 mg | PREFILLED_SYRINGE | INTRAMUSCULAR | Status: DC | PRN

## 2024-01-10 MED ORDER — SODIUM BICARBONATE 8.4 % IV SOLN
50.0000 meq | Freq: Once | INTRAVENOUS | Status: AC
  Administered 2024-01-10: 50 meq via INTRAVENOUS
  Filled 2024-01-10: qty 50

## 2024-01-10 MED ORDER — IPRATROPIUM-ALBUTEROL 0.5-2.5 (3) MG/3ML IN SOLN
3.0000 mL | Freq: Four times a day (QID) | RESPIRATORY_TRACT | Status: DC | PRN
  Administered 2024-01-17: 3 mL via RESPIRATORY_TRACT
  Filled 2024-01-10: qty 3

## 2024-01-10 MED ORDER — NALOXONE HCL 0.4 MG/ML IJ SOLN
0.4000 mg | Freq: Once | INTRAMUSCULAR | Status: AC
  Administered 2024-01-10: 0.4 mg via INTRAVENOUS
  Filled 2024-01-10: qty 1

## 2024-01-10 MED ORDER — FENTANYL CITRATE (PF) 50 MCG/ML IJ SOSY
50.0000 ug | PREFILLED_SYRINGE | INTRAMUSCULAR | Status: DC | PRN
  Administered 2024-01-11 – 2024-01-13 (×11): 50 ug via INTRAVENOUS
  Filled 2024-01-10 (×12): qty 1

## 2024-01-10 MED ORDER — CHLORHEXIDINE GLUCONATE CLOTH 2 % EX PADS
6.0000 | MEDICATED_PAD | Freq: Every day | CUTANEOUS | Status: DC
  Administered 2024-01-10 – 2024-01-19 (×8): 6 via TOPICAL

## 2024-01-10 MED ORDER — SODIUM CHLORIDE 0.9% FLUSH
10.0000 mL | Freq: Three times a day (TID) | INTRAVENOUS | Status: DC
  Administered 2024-01-10 – 2024-01-11 (×2): 10 mL via INTRAPLEURAL

## 2024-01-10 MED ORDER — HEPARIN SODIUM (PORCINE) 5000 UNIT/ML IJ SOLN
5000.0000 [IU] | Freq: Three times a day (TID) | INTRAMUSCULAR | Status: DC
  Administered 2024-01-10 – 2024-01-20 (×30): 5000 [IU] via SUBCUTANEOUS
  Filled 2024-01-10 (×30): qty 1

## 2024-01-10 MED ORDER — SODIUM CHLORIDE 0.9 % IV SOLN
2.0000 g | Freq: Once | INTRAVENOUS | Status: AC
  Administered 2024-01-11: 2 g via INTRAVENOUS
  Filled 2024-01-10: qty 20

## 2024-01-10 MED ORDER — SODIUM CHLORIDE 0.9 % IV SOLN
2.0000 g | Freq: Once | INTRAVENOUS | Status: AC
  Administered 2024-01-10: 2 g via INTRAVENOUS
  Filled 2024-01-10: qty 20

## 2024-01-10 MED ORDER — ARFORMOTEROL TARTRATE 15 MCG/2ML IN NEBU
15.0000 ug | INHALATION_SOLUTION | Freq: Two times a day (BID) | RESPIRATORY_TRACT | Status: DC
  Administered 2024-01-10 – 2024-01-20 (×21): 15 ug via RESPIRATORY_TRACT
  Filled 2024-01-10 (×21): qty 2

## 2024-01-10 MED ORDER — REVEFENACIN 175 MCG/3ML IN SOLN
175.0000 ug | Freq: Every day | RESPIRATORY_TRACT | Status: DC
  Administered 2024-01-10 – 2024-01-20 (×11): 175 ug via RESPIRATORY_TRACT
  Filled 2024-01-10 (×10): qty 3

## 2024-01-10 MED ORDER — IPRATROPIUM-ALBUTEROL 0.5-2.5 (3) MG/3ML IN SOLN
3.0000 mL | Freq: Four times a day (QID) | RESPIRATORY_TRACT | Status: DC
  Administered 2024-01-10 – 2024-01-13 (×15): 3 mL via RESPIRATORY_TRACT
  Filled 2024-01-10 (×16): qty 3

## 2024-01-10 MED ORDER — SODIUM CHLORIDE 0.9 % IV BOLUS
1000.0000 mL | Freq: Once | INTRAVENOUS | Status: AC
Start: 2024-01-10 — End: 2024-01-10
  Administered 2024-01-10: 1000 mL via INTRAVENOUS

## 2024-01-10 MED ORDER — ROCURONIUM BROMIDE 10 MG/ML (PF) SYRINGE
50.0000 mg | PREFILLED_SYRINGE | Freq: Once | INTRAVENOUS | Status: AC
  Administered 2024-01-10: 50 mg via INTRAVENOUS
  Filled 2024-01-10: qty 10

## 2024-01-10 MED ORDER — PANTOPRAZOLE SODIUM 40 MG IV SOLR
40.0000 mg | Freq: Every day | INTRAVENOUS | Status: DC
  Administered 2024-01-10 – 2024-01-15 (×6): 40 mg via INTRAVENOUS
  Filled 2024-01-10 (×6): qty 10

## 2024-01-10 MED ORDER — SODIUM CHLORIDE 0.9 % IV SOLN
500.0000 mg | INTRAVENOUS | Status: DC
  Administered 2024-01-10 – 2024-01-12 (×3): 500 mg via INTRAVENOUS
  Filled 2024-01-10 (×3): qty 5

## 2024-01-10 MED ORDER — HYDROCORTISONE SOD SUC (PF) 100 MG IJ SOLR
100.0000 mg | Freq: Two times a day (BID) | INTRAMUSCULAR | Status: DC
  Administered 2024-01-10 – 2024-01-13 (×7): 100 mg via INTRAVENOUS
  Filled 2024-01-10 (×7): qty 2

## 2024-01-10 MED ORDER — IOHEXOL 350 MG/ML SOLN
75.0000 mL | Freq: Once | INTRAVENOUS | Status: AC | PRN
  Administered 2024-01-10: 75 mL via INTRAVENOUS

## 2024-01-10 MED ORDER — NOREPINEPHRINE 4 MG/250ML-% IV SOLN
0.0000 ug/min | INTRAVENOUS | Status: DC
  Administered 2024-01-10: 5 ug/min via INTRAVENOUS
  Administered 2024-01-10: 7 ug/min via INTRAVENOUS
  Administered 2024-01-11: 2 ug/min via INTRAVENOUS
  Administered 2024-01-11: 5 ug/min via INTRAVENOUS
  Filled 2024-01-10 (×4): qty 250

## 2024-01-10 MED ORDER — KETAMINE HCL 50 MG/5ML IJ SOSY
10.0000 mg | PREFILLED_SYRINGE | INTRAMUSCULAR | Status: AC | PRN
  Administered 2024-01-10 (×2): 10 mg via INTRAVENOUS
  Filled 2024-01-10 (×2): qty 5

## 2024-01-10 MED ORDER — PROPOFOL 1000 MG/100ML IV EMUL
0.0000 ug/kg/min | INTRAVENOUS | Status: DC
  Administered 2024-01-10: 50 ug/kg/min via INTRAVENOUS
  Administered 2024-01-10: 10 ug/kg/min via INTRAVENOUS
  Administered 2024-01-11 (×2): 50 ug/kg/min via INTRAVENOUS
  Administered 2024-01-11: 30 ug/kg/min via INTRAVENOUS
  Administered 2024-01-12: 50 ug/kg/min via INTRAVENOUS
  Administered 2024-01-12: 35 ug/kg/min via INTRAVENOUS
  Administered 2024-01-12: 60 ug/kg/min via INTRAVENOUS
  Administered 2024-01-13: 55 ug/kg/min via INTRAVENOUS
  Filled 2024-01-10 (×10): qty 100

## 2024-01-10 MED ORDER — SODIUM CHLORIDE 0.9 % IV BOLUS
500.0000 mL | Freq: Once | INTRAVENOUS | Status: AC
  Administered 2024-01-10: 500 mL via INTRAVENOUS

## 2024-01-10 MED ORDER — BUDESONIDE 0.25 MG/2ML IN SUSP
0.2500 mg | Freq: Two times a day (BID) | RESPIRATORY_TRACT | Status: DC
  Administered 2024-01-10 – 2024-01-18 (×17): 0.25 mg via RESPIRATORY_TRACT
  Filled 2024-01-10 (×17): qty 2

## 2024-01-10 MED ORDER — KETAMINE HCL 50 MG/5ML IJ SOSY
PREFILLED_SYRINGE | INTRAMUSCULAR | Status: AC
Start: 2024-01-10 — End: 2024-01-10
  Administered 2024-01-10: 10 mg via INTRAVENOUS
  Filled 2024-01-10: qty 5

## 2024-01-10 MED ORDER — SODIUM CHLORIDE 0.9 % IV SOLN
100.0000 mg | INTRAVENOUS | Status: DC
  Administered 2024-01-10 – 2024-01-19 (×10): 100 mg via INTRAVENOUS
  Filled 2024-01-10 (×11): qty 5

## 2024-01-10 MED ORDER — LACTATED RINGERS IV SOLN: INTRAVENOUS | Status: DC

## 2024-01-10 MED ORDER — FENTANYL CITRATE (PF) 50 MCG/ML IJ SOSY
PREFILLED_SYRINGE | INTRAMUSCULAR | Status: AC
  Administered 2024-01-10: 50 ug via INTRAVENOUS
  Filled 2024-01-10: qty 1

## 2024-01-10 MED ORDER — ETOMIDATE 2 MG/ML IV SOLN
0.3000 mg/kg | Freq: Once | INTRAVENOUS | Status: AC
  Administered 2024-01-10: 20 mg via INTRAVENOUS
  Filled 2024-01-10: qty 10

## 2024-01-10 NOTE — ED Notes (Signed)
Bair hugger applied to pt.  

## 2024-01-10 NOTE — Progress Notes (Signed)
 Consult was placed for patient to be admitted.  Chart was reviewed, patient is currently unstable at this time with a BP of 87/47 and a MAP of 59.  EDP will put in a new consult for admission once the patient becomes more stable.

## 2024-01-10 NOTE — H&P (Signed)
 NAME:  Renee Gutierrez, MRN:  [id number], DOB:  Apr 23, 1960, LOS: 0 ADMISSION DATE:  01/10/2024, CONSULTATION DATE:  @TODAY @ REFERRING MD:  [facility], CHIEF COMPLAINT:  acute hypoxic respiratory failure   History of Present Illness:  Ms. Convery is a 63 y/o F with a PMH significant for HTN, fibromyalgia, GERD, emphysema who presented to [facility] ED for severe chest pain, somnolence, and hypoxia found to have RLL pneumonia, R secondary spontaneous pneumothorax, and concern for opiate toxidrome with ED course c/b worsening encephalopathy requiring invasive mechanical ventilation transferred to [facility] for CCM evaluation.  Per ED notes, the patient presented with somnolence, hypoxia, hypotension. Initial concern for opiate toxidrome. O2 SATS 69%. She was given small dose of narcan  with decent response, more alert and breathing, O2 sats improved. Patient in more pain. CTA chest performed and showed RLL consolidation, R pleural effusion, and pneumothorax. A R pigtail chest tube was placed. The patient initially required NE and request was made to transfer patient to ICU at [facility] or [facility]. She was also placed on a narcan  drip for opiate toxidrome.  On 11/22 AM, ED provider switched and patient had a respiratory decompensation, increasingly agitated, pulling on O2 and Ivs, more hypoxic. Decision was made to intubate patient and a femoral central line was placed. The patient was slated to come to [facility] for ICU level care. 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     Significant Hospital Events: Including procedures, antibiotic start and stop dates in addition to other pertinent events   11/21 --> presented to [facility] ED, opiate toxidrome, narcan  drip, CTA chest showed R pneumo, chest tube placed 11/21 --> more altered, agitated, hypoxia, intubated, CVC placed, transferred to [facility]  Interim History / Subjective:  Unable to obtain due to patient's clinical status Afebrile, HR 70s - 80s, hemodynamics supported with pressors, SPO2 > 905 on PRVC 340/+5/100% Drips: NE 5, Propofol  10 Lines/Tubes: R pigtail CT, ETT  Objective    Blood pressure (!) 146/60, pulse 64, temperature (!) 95.5 F (35.3 C), resp. rate (!) 21, height 4' 11 (1.499 m), weight 43.1 kg, SpO2 100%.    Vent Mode: PRVC FiO2 (%):  [60 %-100 %] 60 % Set Rate:  [18 bmp-20 bmp] 20 bmp Vt Set:  [340 mL] 340 mL PEEP:  [5 cmH20] 5 cmH20 Plateau Pressure:  [14 cmH20] 14 cmH20   Intake/Output Summary (Last 24 hours) at 01/10/2024 1218 Last data filed at 01/10/2024 1058 Gross per 24 hour  Intake 1925.62 ml  Output 550 ml  Net 1375.62 ml   Filed Weights   01/10/24 0443  Weight: 43.1 kg    Examination: General: intubated, sedated, not in any distress HENT: anicteric sclera, pale conjunctivae, PERRL Lungs: coarse breath sounds bilaterally, CT draining brownish turbid fluid Cardiovascular: RRR, no r/m/g Abdomen: soft, non-tender Extremities: trace edema Neuro: awakens to verbal and tactile stimulation, does not follow commands GU: deferred  Resolved problem  list   Assessment and Plan   Cardiovascular:  #Shock: likey septic due to CAP #Community Acquired Pneumonia #Parapneumonic Effusion - Levophed  gtt - CAP coverage with Ceftriaxone  and Azithromycin  - Add Micafungin  given budding yeast in gram stain - Steroids per CAPE COD trial - Target MAP > 65 mmHg - Pleural fluid gram stain with budding yeast and GPC in chains and clusters. - FU culture data - RVP - MRSA  swab  #Acute Hypoxic Respiratory Failure: likely due to CAP, para-pneumonic effusion, R pneumothorax. - Prevent lung injury - Low tidal volume ventilation - Aim for Pplat < 30 and DP < 15 - Lowest FiO2 to keep SPO2 > 90% and PaO2 > 65 mmHg - Permissive hypercapnia - pH goal > 7.2 - Daily SAT/SBT - ICS/LAMA/LABA - Hypertonic saline Q 8 H - Chest PT - Abx per above.  #Secondary Spontaneous Pneumothorax: - Daily CXR - CT to - 20 suction  #Stress Ulcer PPx: PPI  #Nutrition: NPO w/ meds/sips  #AKI: likely pre-renal due to shock state - IVF resuscitation - Renally adjust medications - Avoid Nephrotoxins - Strict I/Os - Foley catheter  #High anion gap metabolic acidosis: likely due to uremia. - Bicarb pushes as needed - Calculated serum osm 280 (NL) - FU measured serum osm, if elevated, will need to rule out toxic ingestions  #VTE ppx: Heparin  subQ TID  #Acute Metabolic Encephalopathy: likely due to septic shock, uremia etc... CT head negative for acute pathology - Supportive care as above  #PADs: - Propofol  drip - Fentanyl  25 mcg Q 1 H PRN agitation pushes.  Disposition: ICU appropriate for vent support and vasopressors  Labs   CBC: Recent Labs  Lab 01/03/24 1324 01/03/24 1336 01/04/24 0419 01/05/24 0409 01/06/24 0431 01/10/24 0450  WBC 15.8*  --  11.2* 10.5 10.8* 13.7*  HGB 14.0 14.6 11.4* 11.6* 11.0* 10.6*  HCT 42.8 43.0 35.5* 38.5 35.6* 34.5*  MCV 81.2  --  83.7 87.5 85.4 88.0  PLT 893*  --  688* 634* 631* 442*    Basic Metabolic Panel: Recent Labs  Lab 01/03/24 1324 01/03/24 1336 01/04/24 0419 01/05/24 0409 01/06/24 0431 01/10/24 0450 01/10/24 0939  NA 141 140 141 141 140 130*  --   K 2.9* 2.9* 2.8* 4.9 4.0 4.7  --   CL 103 105 106 108 105 92*  --   CO2 21*  --  24 25 26  18*  --   GLUCOSE 119* 121* 102* 86 97 85  --   BUN 18 19 16 14 10  43*  --   CREATININE 1.34* 1.30* 0.88 0.76 0.66 4.24*  --   CALCIUM  10.2  --  9.1 9.3 9.5 9.1  --   MG  2.2  --  2.2 2.1 2.1  --  2.2  PHOS  --   --  3.1  --   --   --  8.8*   GFR: Estimated Creatinine Clearance: 9.1 mL/min (A) (by C-G formula based on SCr of 4.24 mg/dL (H)). Recent Labs  Lab 01/04/24 0419 01/05/24 0409 01/06/24 0431 01/10/24 0450 01/10/24 0630 01/10/24 0843 01/10/24 0940  WBC 11.2* 10.5 10.8* 13.7*  --   --   --   LATICACIDVEN  --   --   --   --  1.6 0.9 2.2*    Liver Function Tests: Recent Labs  Lab 01/03/24 1342 01/04/24 0419 01/10/24 0450  AST 16 11* 172*  ALT <5 <5 43  ALKPHOS 88 72 64  BILITOT 0.2 <  0.2 0.3  PROT 7.5 5.9* 5.8*  ALBUMIN  4.2 3.4* 3.3*   Recent Labs  Lab 01/03/24 1342 01/10/24 0450  LIPASE 17 13   No results for input(s): AMMONIA in the last 168 hours.  ABG    Component Value Date/Time   PHART 7.12 (LL) 01/10/2024 0940   PCO2ART 48 01/10/2024 0940   PO2ART 222 (H) 01/10/2024 0940   HCO3 16.1 (L) 01/10/2024 0940   TCO2 21 (L) 01/03/2024 1336   ACIDBASEDEF 13.6 (H) 01/10/2024 0940   O2SAT 100 01/10/2024 0940     Coagulation Profile: Recent Labs  Lab 01/10/24 0939  INR 0.9    Cardiac Enzymes: No results for input(s): CKTOTAL, CKMB, CKMBINDEX, TROPONINI in the last 168 hours.  HbA1C: No results found for: HGBA1C  CBG: No results for input(s): GLUCAP in the last 168 hours.  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   Surgical History:   Past Surgical History:  Procedure Laterality Date   ABDOMINAL HYSTERECTOMY     ABDOMINAL SURGERY     tumor removed   APPENDECTOMY      BRAIN SURGERY     to fix AVM   CEREBRAL ANEURYSM REPAIR     CERVICAL FUSION     CESAREAN SECTION     CHOLECYSTECTOMY     COLONOSCOPY N/A 12/31/2023   Procedure: COLONOSCOPY;  Surgeon: Shaaron Lamar HERO, MD;  Location[facility];  Service: Endoscopy;  Laterality: N/A;   ESOPHAGEAL DILATION N/A 11/28/2023   Procedure: DILATION, ESOPHAGUS;  Surgeon: Cindie Carlin POUR, DO;  Location: [facility];  Service: Endoscopy;  Laterality: N/A;   ESOPHAGOGASTRODUODENOSCOPY N/A 11/28/2023   Procedure: EGD (ESOPHAGOGASTRODUODENOSCOPY);  Surgeon: Cindie Carlin POUR, DO;  Location: [facility];  Service: Endoscopy;  Laterality: N/A;   ESOPHAGOGASTRODUODENOSCOPY N/A 12/05/2023   Procedure: EGD (ESOPHAGOGASTRODUODENOSCOPY);  Surgeon: Eartha Flavors, Toribio, MD;  Location: [facility];  Service: Gastroenterology;  Laterality: N/A;   ESOPHAGOGASTRODUODENOSCOPY N/A 01/05/2024   Procedure: EGD (ESOPHAGOGASTRODUODENOSCOPY);  Surgeon: Cindie Carlin POUR, DO;  Location: [facility];  Service: Endoscopy;  Laterality: N/A;   LUNG SURGERY     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     Social History:   reports that she has been smoking cigarettes. She has never used smokeless tobacco. She reports that she does not drink alcohol and does not use drugs.   Family History:  Her family history includes Bladder Cancer in her father; Liver cancer in her father; Throat cancer in her mother. There is no history of Colon cancer.   Allergies Allergies  Allergen Reactions   Buprenorphine Hcl Swelling    All extremities and neck and face swelled up huge according to patient   Bupropion Swelling and Other (See Comments)    Moody and depressed   Codeine Itching, Nausea And Vomiting, Nausea Only and Swelling   Sulfamethoxazole-Trimethoprim Swelling and Other (See Comments)    Swelled up her eye   Nsaids Other (See Comments)    Unknown    Pregabalin  Swelling    Lyrica     Sulfa  Antibiotics Other (See Comments)  Unknown    Tapentadol Nausea And Vomiting    Pt states she can take   Clindamycin Other (See Comments)    Heart burn   Clindamycin/Lincomycin Rash and Other (See Comments)    Heart burn   Cymbalta  [Duloxetine  Hcl] Swelling   Diclofenac Swelling    Cream    Diclofenac Sodium Swelling    Oral med   Gabapentin Swelling   Oxycodone  Nausea Only    No longer has a reaction per pt   Toradol  [Ketorolac  Tromethamine ] Rash     Home Medications  Prior to Admission medications   Medication Sig Start Date End Date Taking? Authorizing Provider  amLODipine  (NORVASC ) 10 MG tablet Take 1 tablet (10 mg total) by mouth daily. 12/15/23   Arrien, Mauricio Daniel, MD  DULoxetine  (CYMBALTA ) 60 MG capsule Take 60 mg by mouth 2 (two) times daily. 05/16/20   [provider]  HYDROmorphone  (DILAUDID ) 4 MG tablet Take 4 mg by mouth every 8 (eight) hours as needed for severe pain (pain score 7-10).    [provider]  labetalol  (NORMODYNE ) 300 MG tablet Take 1 tablet (300 mg total) by mouth 2 (two) times daily. 01/01/24   Evonnie Lenis, MD  lisinopril  (ZESTRIL ) 20 MG tablet Take 1 tablet (20 mg total) by mouth daily. 01/02/24   Evonnie Lenis, MD  morphine  (MSIR) 15 MG tablet Take 15 mg by mouth every 4 (four) hours as needed for severe pain (pain score 7-10).    [provider]  ondansetron  (ZOFRAN -ODT) 8 MG disintegrating tablet Take 1 tablet (8 mg total) by mouth every 6 (six) hours as needed for nausea or vomiting. 11/29/23   Darci Pore, MD  pantoprazole  (PROTONIX ) 40 MG tablet Take 1 tablet (40 mg total) by mouth 2 (two) times daily. 11/29/23   Darci Pore, MD  phenytoin  (DILANTIN ) 100 MG ER capsule Take 1 capsule (100 mg total) by mouth 2 (two) times daily. 01/06/24   Arlon Carliss ORN, DO  prednisoLONE  acetate (PRED FORTE ) 1 % ophthalmic suspension Place 1 drop into the left eye in the morning and at bedtime. 12/14/23   Arrien,  Mauricio Daniel, MD  pregabalin  (LYRICA ) 50 MG capsule Take 50 mg by mouth See admin instructions. Take 1 capsule by mouth every night at bedtime for 1 week then 1 capsule twice daily for 1 week then 1 capsule three times daily.    [provider]  prochlorperazine  (COMPAZINE ) 10 MG tablet Take 1 tablet (10 mg total) by mouth every 6 (six) hours as needed for nausea or vomiting. 12/24/23   Bryn Bernardino NOVAK, MD  rOPINIRole  (REQUIP ) 2 MG tablet Take 4 mg by mouth See admin instructions. Take 1 tablet in the morning and 2 tablet every evening. 11/11/14   [provider]  simethicone  (GAS RELIEF EXTRA STRENGTH) 125 MG chewable tablet Chew 125 mg by mouth daily as needed for flatulence.  05/24/14   [provider]  spironolactone  (ALDACTONE ) 25 MG tablet Take 1 tablet (25 mg total) by mouth daily. 12/14/23   Arrien, Elidia Sieving, MD  sucralfate  (CARAFATE ) 1 GM/10ML suspension Take 10 mLs (1 g total) by mouth 4 (four) times daily -  with meals and at bedtime. 12/07/23   Evonnie Lenis, MD     Due to a high probability of clinically significant, life threatening deterioration, the patient required my highest level of preparedness to intervene emergently and I personally spent this critical care time directly and personally managing the patient. This critical care  time included obtaining a history; examining the patient; pulse oximetry; ordering and review of studies; arranging urgent treatment with development of a management plan; evaluation of patient's response to treatment; frequent reassessment; and, discussions with other providers.  This critical care time was performed to assess and manage the high probability of imminent, life-threatening deterioration that could result in multi-organ failure. It was exclusive of separately billable procedures and treating other patients and teaching time. Critical Care Time: 60 minutes.  Paula Southerly, MD [facility]

## 2024-01-10 NOTE — Plan of Care (Signed)
  Problem: Education: Goal: Knowledge of General Education information will improve Description: Including pain rating scale, medication(s)/side effects and non-pharmacologic comfort measures 01/10/2024 1904 by Janalyn Ileana HERO, RN Outcome: Progressing 01/10/2024 1903 by Janalyn Ileana HERO, RN Outcome: Progressing   Problem: Health Behavior/Discharge Planning: Goal: Ability to manage health-related needs will improve 01/10/2024 1904 by Janalyn Ileana HERO, RN Outcome: Progressing 01/10/2024 1903 by Janalyn Ileana HERO, RN Outcome: Progressing   Problem: Clinical Measurements: Goal: Ability to maintain clinical measurements within normal limits will improve 01/10/2024 1904 by Janalyn Ileana HERO, RN Outcome: Progressing 01/10/2024 1903 by Janalyn Ileana HERO, RN Outcome: Progressing Goal: Will remain free from infection 01/10/2024 1904 by Janalyn Ileana HERO, RN Outcome: Progressing 01/10/2024 1903 by Janalyn Ileana HERO, RN Outcome: Progressing Goal: Diagnostic test results will improve 01/10/2024 1904 by Janalyn Ileana HERO, RN Outcome: Progressing 01/10/2024 1903 by Janalyn Ileana HERO, RN Outcome: Progressing Goal: Respiratory complications will improve 01/10/2024 1904 by Janalyn Ileana HERO, RN Outcome: Progressing 01/10/2024 1903 by Janalyn Ileana HERO, RN Outcome: Progressing Goal: Cardiovascular complication will be avoided 01/10/2024 1904 by Janalyn Ileana HERO, RN Outcome: Progressing 01/10/2024 1903 by Janalyn Ileana HERO, RN Outcome: Progressing   Problem: Activity: Goal: Risk for activity intolerance will decrease 01/10/2024 1904 by Janalyn Ileana HERO, RN Outcome: Progressing 01/10/2024 1903 by Janalyn Ileana HERO, RN Outcome: Progressing   Problem: Coping: Goal: Level of anxiety will decrease 01/10/2024 1904 by Janalyn Ileana HERO, RN Outcome:  Progressing 01/10/2024 1903 by Janalyn Ileana HERO, RN Outcome: Progressing   Problem: Elimination: Goal: Will not experience complications related to bowel motility 01/10/2024 1904 by Janalyn Ileana HERO, RN Outcome: Progressing 01/10/2024 1903 by Janalyn Ileana HERO, RN Outcome: Progressing Goal: Will not experience complications related to urinary retention 01/10/2024 1904 by Janalyn Ileana HERO, RN Outcome: Progressing 01/10/2024 1903 by Janalyn Ileana HERO, RN Outcome: Progressing   Problem: Pain Managment: Goal: General experience of comfort will improve and/or be controlled 01/10/2024 1904 by Janalyn Ileana HERO, RN Outcome: Progressing 01/10/2024 1903 by Janalyn Ileana HERO, RN Outcome: Progressing   Problem: Safety: Goal: Ability to remain free from injury will improve 01/10/2024 1904 by Janalyn Ileana HERO, RN Outcome: Progressing 01/10/2024 1903 by Janalyn Ileana HERO, RN Outcome: Progressing   Problem: Skin Integrity: Goal: Risk for impaired skin integrity will decrease 01/10/2024 1904 by Janalyn Ileana HERO, RN Outcome: Progressing 01/10/2024 1903 by Janalyn Ileana HERO, RN Outcome: Progressing   Problem: Nutrition: Goal: Adequate nutrition will be maintained 01/10/2024 1904 by Janalyn Ileana HERO, RN Outcome: Not Progressing 01/10/2024 1903 by Janalyn Ileana HERO, RN Outcome: Not Progressing

## 2024-01-10 NOTE — Sepsis Progress Note (Signed)
 Elink following code sepsis  0747- Notified provider and bedside nurse of need to order blood cultures.

## 2024-01-10 NOTE — ED Provider Notes (Addendum)
 [facility]ERGENCY DEPT [facility] Emergency Department Provider Note MRN:  [id number]  Arrival date & time: 01/10/24     Chief Complaint   Chest Pain   History of Present Illness   Renee Gutierrez is a 63 y.o. year-old female with a history of hypertension, fibromyalgia, GERD presenting to the ED with chief complaint of chest pain.  Severe chest pain.  Patient also presenting with somnolence, hypoxia.  Review of Systems  I was unable to obtain a full/accurate HPI, PMH, or ROS due to the patient's altered mental status.  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    Past Surgical History:  Procedure Laterality Date   ABDOMINAL HYSTERECTOMY     ABDOMINAL SURGERY     tumor removed   APPENDECTOMY     BRAIN SURGERY     to fix AVM   CEREBRAL ANEURYSM REPAIR     CERVICAL FUSION     CESAREAN SECTION     CHOLECYSTECTOMY     COLONOSCOPY N/A 12/31/2023   Procedure: COLONOSCOPY;  Surgeon: Shaaron Lamar HERO, MD;  Location: [facility] ENDO SU[facility];  Service: Endoscopy;  Laterality: N/A;   ESOPHAGEAL DILATION N/A 11/28/2023   Procedure: DILATION, ESOPHAGUS;  Surgeon: Cindie Carlin POUR, DO;  Location: [facility];  Service: Endoscopy;  Laterality: N/A;   ESOPHAGOGASTRODUODENOSCOPY N/A 11/28/2023   Procedure: EGD (ESOPHAGOGASTRODUODENOSCOPY);  Surgeon: Cindie Carlin POUR, DO;  Location: [facility];  Service: Endoscopy;  Laterality: N/A;   ESOPHAGOGASTRODUODENOSCOPY N/A 12/05/2023   Procedure: EGD (ESOPHAGOGASTRODUODENOSCOPY);   Surgeon: Eartha Flavors, Toribio, MD;  Location: [facility];  Service: Gastroenterology;  Laterality: N/A;   ESOPHAGOGASTRODUODENOSCOPY N/A 01/05/2024   Procedure: EGD (ESOPHAGOGASTRODUODENOSCOPY);  Surgeon: Cindie Carlin POUR, DO;  Location: [facility];  Service: Endoscopy;  Laterality: N/A;   LUNG SURGERY     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    Family History  Problem Relation Age of Onset   Throat cancer Mother    Bladder Cancer Father    Liver cancer Father    Colon cancer Neg Hx     Social History   Socioeconomic History   Marital status: Married    Spouse name: Not on file   Number of children: Not on file   Years of education: Not on file   Highest education level: Not on file  Occupational History   Not on file  Tobacco Use   Smoking status: Every Day    Types: Cigarettes   Smokeless tobacco: Never   Tobacco comments:    1 cigarette daily   Vaping Use   Vaping status: Never Used  Substance and Sexual Activity   Alcohol use: No   Drug use: No   Sexual activity: Yes    Birth control/protection: Surgical  Other Topics Concern   Not on file  Social History Narrative   Not on file  Social Drivers of Corporate Investment Banker Strain: Not on file  Food Insecurity: No Food Insecurity (01/04/2024)   Hunger Vital Sign    Worried About Running Out of Food in the Last Year: Never true    Ran Out of Food in the Last Year: Never true  Recent Concern: Food Insecurity - Food Insecurity Present (12/28/2023)   Hunger Vital Sign    Worried About Running Out of Food in the Last Year: Sometimes true    Ran Out of Food in the Last Year: Sometimes true  Transportation Needs: No Transportation Needs (01/04/2024)   PRAPARE - Administrator, Civil Service (Medical): No    Lack of Transportation (Non-Medical): No  Physical Activity: Not on file  Stress: Not on file  Social Connections: Not on file  Intimate Partner  Violence: Not At Risk (01/04/2024)   Humiliation, Afraid, Rape, and Kick questionnaire    Fear of Current or Ex-Partner: No    Emotionally Abused: No    Physically Abused: No    Sexually Abused: No     Physical Exam   Vitals:   01/10/24 0700 01/10/24 0745  BP: (!) 93/46 (!) 110/59  Pulse: 80 83  Resp: 17 15  SpO2: (!) 89% 97%    CONSTITUTIONAL: Ill-appearing NEURO/PSYCH: Intermittently somnolent with depressed respiratory rate then wakes up complaining of pain EYES:  eyes equal and reactive ENT/NECK:  no LAD, no JVD CARDIO: Regular rate, well-perfused, normal S1 and S2 PULM:  CTAB no wheezing or rhonchi GI/GU:  non-distended, non-tender MSK/SPINE:  No gross deformities, no edema SKIN:  no rash, atraumatic   *Additional and/or pertinent findings included in MDM below  Diagnostic and Interventional Summary    EKG Interpretation Date/Time:  Saturday January 10 2024 07:32:51 EST Ventricular Rate:  83 PR Interval:  170 QRS Duration:  99 QT Interval:  400 QTC Calculation: 470 R Axis:   -4  Text Interpretation: Sinus rhythm Probable anteroseptal infarct, old Confirmed by Cleotilde Rogue ([id number]) on 01/10/2024 7:55:47 AM       Labs Reviewed  CBC - Abnormal; Notable for the following components:      Result Value   WBC 13.7 (*)    Hemoglobin 10.6 (*)    HCT 34.5 (*)    RDW 15.9 (*)    Platelets 442 (*)    All other components within normal limits  COMPREHENSIVE METABOLIC PANEL WITH GFR - Abnormal; Notable for the following components:   Sodium 130 (*)    Chloride 92 (*)    CO2 18 (*)    BUN 43 (*)    Creatinine, Ser 4.24 (*)    Total Protein 5.8 (*)    Albumin  3.3 (*)    AST 172 (*)    GFR, Estimated 11 (*)    Anion gap 20 (*)    All other components within normal limits  TROPONIN T, HIGH SENSITIVITY - Abnormal; Notable for the following components:   Troponin T High Sensitivity 67 (*)    All other components within normal limits  TROPONIN T, HIGH  SENSITIVITY - Abnormal; Notable for the following components:   Troponin T High Sensitivity 62 (*)    All other components within normal limits  GRAM STAIN  CULTURE, BODY FLUID W GRAM STAIN -BOTTLE  CULTURE, BLOOD (ROUTINE X 2)  CULTURE, BLOOD (ROUTINE X 2)  LIPASE, BLOOD  LACTIC ACID, PLASMA  LACTIC ACID, PLASMA    DG Chest Port 1 View  Final Result  CT Angio Chest Pulmonary Embolism (PE) W or WO Contrast  Final Result    CT HEAD WO CONTRAST ( )  Final Result    DG Chest Port 1 View  Final Result      Medications  azithromycin  (ZITHROMAX ) 500 mg in sodium chloride  0.9 % 250 mL IVPB (500 mg Intravenous New Bag/Given 01/10/24 0743)  lactated ringers  infusion ( Intravenous New Bag/Given 01/10/24 0748)  ketamine  50 mg in normal saline 5 mL (10 mg/mL) syringe (10 mg Intravenous Given 01/10/24 0716)  norepinephrine  (LEVOPHED ) 4mg  in (0.016 mg/mL) premix infusion (has no administration in time range)  etomidate  (AMIDATE ) injection 12.94 mg (has no administration in time range)  rocuronium  (ZEMURON ) injection 50 mg (has no administration in time range)  propofol  (DIPRIVAN ) 1000 MG/100ML infusion (has no administration in time range)  sodium chloride  0.9 % bolus 500 mL (500 mLs Intravenous New Bag/Given 01/10/24 0508)  naloxone  (NARCAN ) injection 0.4 mg (0.4 mg Intravenous Given 01/10/24 0508)  ketamine  50 mg in normal saline 5 mL (10 mg/mL) syringe (10 mg Intravenous Given 01/10/24 0636)  iohexol  (OMNIPAQUE ) 350 MG/ML injection 75 mL (75 mLs Intravenous Contrast Given 01/10/24 0600)  sodium chloride  0.9 % bolus 1,000 mL (1,000 mLs Intravenous New Bag/Given 01/10/24 0617)  cefTRIAXone  (ROCEPHIN ) 2 g in sodium chloride  0.9 % 100 mL IVPB (2 g Intravenous New Bag/Given 01/10/24 0638)  naloxone  (NARCAN ) injection 0.4 mg (0.4 mg Intravenous Given 01/10/24 0750)     Procedures  /  Critical Care .Critical Care  Performed by: Theadore Ozell HERO, MD Authorized by: Theadore Ozell HERO,  MD   Critical care provider statement:    Critical care time (minutes):  80   Critical care was necessary to treat or prevent imminent or life-threatening deterioration of the following conditions:  Shock   Critical care was time spent personally by me on the following activities:  Development of treatment plan with patient or surrogate, discussions with consultants, evaluation of patient's response to treatment, examination of patient, ordering and review of laboratory studies, ordering and review of radiographic studies, ordering and performing treatments and interventions, pulse oximetry, re-evaluation of patient's condition and review of old charts CHEST TUBE INSERTION  Date/Time: 01/10/2024 7:30 AM  Performed by: Theadore Ozell HERO, MD Authorized by: Theadore Ozell HERO, MD   Consent:    Consent obtained:  Written and emergent situation   Consent given by:  Spouse   Risks, benefits, and alternatives were discussed: yes     Risks discussed:  Bleeding, damage to surrounding structures, incomplete drainage, infection, pain and nerve damage Universal protocol:    Procedure explained and questions answered to patient or proxy's satisfaction: yes     Immediately prior to procedure, a time out was called: yes     Patient identity confirmed:  Arm band Pre-procedure details:    Skin preparation:  Chlorhexidine    Preparation: Patient was prepped and draped in the usual sterile fashion   Sedation:    Sedation type:  None Anesthesia:    Anesthesia method:  Local infiltration   Local anesthetic:  Lidocaine  1% w/o epi Procedure details:    Placement location: right anterolateral.   Scalpel size:  11   Tube size (French): pigtail.   Tension pneumothorax: no     Tube connected to:  Suction   Drainage characteristics:  Purulent and cloudy Comments:     Chest tube attempted by me, large amount of purulent fluid removed and sent for culture.  Pigtail catheter inserted by  Dr. Cleotilde, see separate note  for details.   ED Course and Medical Decision Making  Initial Impression and Ddx Bizarre presentation of somnolence, hypoxia, hypotension, having this intermittent somnolence mixed with waking up in crying out for help due to pain.  Initial and primary concern would be opioid toxidrome.  O2 saturation down to 69% on room air.  Provided with small dose of Narcan  with decent response, she is more alert, her breathing is more appropriate, her oxygen saturations are much improved, but unfortunately her pain is worse.  She has had multiple ER visits and admissions for pain, I do not see hypoxia as a listed issue during her last admission.  This raises concern for pulmonary embolism, less likely aortic pathology.  Providing ketamine  for pain, proceeding with CT imaging.  Given the high concern for pulmonary embolism given the amount of pain, the hypoxia, and the concern for possible large PE or even saddle PE with hypotension, decision was made to obtain rapid CTA PE imaging prior to obtaining labs.  Had normal renal function just 4 days ago.  Past medical/surgical history that increases complexity of ED encounter: Hypertension  Interpretation of Diagnostics I personally reviewed the Chest Xray and my interpretation is as follows: No tension pneumothorax  Labs reveal acute renal failure.  CT reveals possible aspiration pneumonia, right-sided small to moderate pneumothorax  Patient Reassessment and Ultimate Disposition/Management     Chest tube placed, patient requiring norepinephrine , will call ICU for admission at [facility].  Spoke with Dr. Harold of ICU who accepts patient for transfer.  Patient management required discussion with the following services or consulting groups:  Intensivist Service  Complexity of Problems Addressed Acute illness or injury that poses threat of life of bodily function  Additional Data Reviewed and Analyzed Further history obtained from: Further history from  spouse/family member  Additional Factors Impacting ED Encounter Risk Consideration of hospitalization  Ozell HERO. Theadore, MD [facility] Emergency Medicine [facility] [email or web address]  Final Clinical Impressions(s) / ED Diagnoses     ICD-10-CM   1. Acute renal failure, unspecified acute renal failure type  N17.9     2. Pneumonia due to infectious organism, unspecified laterality, unspecified part of lung  J18.9     3. Pneumothorax, unspecified type  J93.9     4. Shock [facility])  R57.9       ED Discharge Orders     None        Discharge Instructions Discussed with and Provided to Patient:   Discharge Instructions   None      Theadore Ozell HERO, MD 01/10/24 [id number]    Theadore Ozell HERO, MD 01/10/24 [phone]

## 2024-01-10 NOTE — Progress Notes (Signed)
 Echocardiogram 2D Echocardiogram has been performed.  Renee Gutierrez 01/10/2024, 5:38 PM

## 2024-01-10 NOTE — ED Provider Notes (Signed)
 This patient was accepted in signout at the change of shift, the patient has had a slight decompensation in her respiratory status and continues to be in respiratory distress increasingly agitated pulling at oxygen and IVs, hypoxia is difficult to correct as she is not compliant with interventions.  With her progressive altered mental status and agitation the decision was made to intubate which was done smoothly and efficiently on the first attempt.  Due to poor IV access with only 1 20-gauge IV and the need for IV fluids, antibiotics, propofol  and Levophed  I placed a central line in the right femoral vein.  The patient tolerated this very well.  There was a slight hematoma after the procedure in the right groin however there was a single stick with a successful draw and flush of blood.  Patient accepted to ICU at [facility]  Procedure Name: Intubation Date/Time: 01/10/2024 8:38 AM  Performed by: Cleotilde Rogue, MDPre-anesthesia Checklist: Patient identified, Patient being monitored, Emergency Drugs available and Suction available Oxygen Delivery Method: Non-rebreather mask Preoxygenation: Pre-oxygenation with 100% oxygen Induction Type: Rapid sequence Ventilation: Mask ventilation without difficulty Laryngoscope Size: Mac and 4 Grade View: Grade III Tube size: 7.5 mm Number of attempts: 1 Airway Equipment and Method: Stylet Placement Confirmation: ETT inserted through vocal cords under direct vision, Positive ETCO2 and Breath sounds checked- equal and bilateral Secured at: 23 cm Tube secured with: ETT holder Dental Injury: Teeth and Oropharynx as per pre-operative assessment  Difficulty Due To: Difficulty was unanticipated Comments:      CHEST TUBE INSERTION  Date/Time: 01/10/2024 8:39 AM  Performed by: Cleotilde Rogue, MD Authorized by: Cleotilde Rogue, MD   Consent:    Consent obtained:  Emergent situation Universal protocol:    Immediately prior to procedure, a time out was called: yes      Patient identity confirmed:  Arm band Pre-procedure details:    Skin preparation:  Chlorhexidine  with alcohol Anesthesia:    Anesthesia method:  Local infiltration   Local anesthetic:  Lidocaine  1% WITH epi Procedure details:    Placement location:  R lateral   Tube size (Fr):  8   Ultrasound guidance: no     Tension pneumothorax: no     Tube connected to:  Heimlich valve, suction and water  seal   Drainage characteristics:  Purulent   Suture material:  2-0 silk   Dressing:  4x4 sterile gauze Post-procedure details:    Post-insertion x-ray findings: tube in good position     Procedure completion:  Tolerated well, no immediate complications Comments:       Central Line  Date/Time: 01/10/2024 8:40 AM  Performed by: Cleotilde Rogue, MD Authorized by: Cleotilde Rogue, MD   Consent:    Consent obtained:  Emergent situation Universal protocol:    Immediately prior to procedure, a time out was called: yes     Patient identity confirmed:  Arm band Pre-procedure details:    Indication(s): central venous access and insufficient peripheral access     Hand hygiene: Hand hygiene performed prior to insertion     Sterile barrier technique: All elements of maximal sterile technique followed     Skin preparation:  Chlorhexidine    Skin preparation agent: Skin preparation agent completely dried prior to procedure   Anesthesia:    Anesthesia method:  None Procedure details:    Location:  R femoral   Patient position:  Supine   Procedural supplies:  Triple lumen   Catheter size:  7 Fr   Landmarks identified: yes  Number of attempts:  1   Successful placement: yes   Post-procedure details:    Post-procedure:  Dressing applied and line sutured   Assessment:  Blood return through all ports and free fluid flow   Procedure completion:  Tolerated well, no immediate complications   Complications:  Small hematoma - does not enlarge with infusion of fluids Comments:           Cleotilde Rogue, MD 01/10/24 0840

## 2024-01-10 NOTE — ED Notes (Addendum)
 Took over care of this patient and transferred to room 1. Chest tube assessed with no significant findings, suction hooked to pleura-vac and set to intermittent (as ordered by MD) at . CVC assessed with mild edema noted around insertion site, but all infusions are running appropriately and there is no increasing in the edema. RT at bedside to monitor airway and ventilator.

## 2024-01-10 NOTE — ED Notes (Signed)
 Pt gone to CT

## 2024-01-10 NOTE — ED Notes (Signed)
Transport has been called.  

## 2024-01-10 NOTE — Plan of Care (Signed)

## 2024-01-10 NOTE — ED Triage Notes (Signed)
 Pt comes in c/o chest pain; pt is unable to stay alert to answer questions and has snoring respirations  Pt takes pain medication at home but pts spouse is unable to give any medical history on what meds pt has taken  O2 sats at 69% upon arrival

## 2024-01-11 ENCOUNTER — Inpatient Hospital Stay (HOSPITAL_COMMUNITY)

## 2024-01-11 ENCOUNTER — Encounter (HOSPITAL_COMMUNITY): Payer: Self-pay | Admitting: Pulmonary Disease

## 2024-01-11 DIAGNOSIS — R6521 Severe sepsis with septic shock: Secondary | ICD-10-CM | POA: Diagnosis not present

## 2024-01-11 DIAGNOSIS — G8929 Other chronic pain: Secondary | ICD-10-CM

## 2024-01-11 DIAGNOSIS — J189 Pneumonia, unspecified organism: Secondary | ICD-10-CM | POA: Diagnosis not present

## 2024-01-11 DIAGNOSIS — J869 Pyothorax without fistula: Secondary | ICD-10-CM | POA: Diagnosis not present

## 2024-01-11 DIAGNOSIS — A419 Sepsis, unspecified organism: Secondary | ICD-10-CM | POA: Diagnosis not present

## 2024-01-11 DIAGNOSIS — K257 Chronic gastric ulcer without hemorrhage or perforation: Secondary | ICD-10-CM

## 2024-01-11 LAB — GLUCOSE, CAPILLARY
Glucose-Capillary: 112 mg/dL — ABNORMAL HIGH (ref 70–99)
Glucose-Capillary: 115 mg/dL — ABNORMAL HIGH (ref 70–99)
Glucose-Capillary: 124 mg/dL — ABNORMAL HIGH (ref 70–99)
Glucose-Capillary: 126 mg/dL — ABNORMAL HIGH (ref 70–99)
Glucose-Capillary: 127 mg/dL — ABNORMAL HIGH (ref 70–99)
Glucose-Capillary: 141 mg/dL — ABNORMAL HIGH (ref 70–99)
Glucose-Capillary: 142 mg/dL — ABNORMAL HIGH (ref 70–99)

## 2024-01-11 LAB — COMPREHENSIVE METABOLIC PANEL WITH GFR
ALT: 37 U/L (ref 0–44)
AST: 101 U/L — ABNORMAL HIGH (ref 15–41)
Albumin: 2.5 g/dL — ABNORMAL LOW (ref 3.5–5.0)
Alkaline Phosphatase: 54 U/L (ref 38–126)
Anion gap: 15 (ref 5–15)
BUN: 46 mg/dL — ABNORMAL HIGH (ref 8–23)
CO2: 18 mmol/L — ABNORMAL LOW (ref 22–32)
Calcium: 8.8 mg/dL — ABNORMAL LOW (ref 8.9–10.3)
Chloride: 99 mmol/L (ref 98–111)
Creatinine, Ser: 2.61 mg/dL — ABNORMAL HIGH (ref 0.44–1.00)
GFR, Estimated: 20 mL/min — ABNORMAL LOW (ref >60)
Glucose, Bld: 127 mg/dL — ABNORMAL HIGH (ref 70–99)
Potassium: 4.1 mmol/L (ref 3.5–5.1)
Sodium: 132 mmol/L — ABNORMAL LOW (ref 135–145)
Total Bilirubin: 0.3 mg/dL (ref 0.0–1.2)
Total Protein: 5.2 g/dL — ABNORMAL LOW (ref 6.5–8.1)

## 2024-01-11 LAB — BLOOD GAS, ARTERIAL
Acid-base deficit: 6 mmol/L — ABNORMAL HIGH (ref 0.0–2.0)
Bicarbonate: 20.7 mmol/L (ref 20.0–28.0)
Drawn by: 72234
FIO2: 40 %
MECHVT: 320 mL
Map: 8 cmH2O
O2 Saturation: 95.7 %
PEEP: 5 cmH2O
PIP: 12 cmH2O
Patient temperature: 36.9
RATE: 20 {breaths}/min
pCO2 arterial: 44 mmHg (ref 32–48)
pH, Arterial: 7.28 — ABNORMAL LOW (ref 7.35–7.45)
pO2, Arterial: 75 mmHg — ABNORMAL LOW (ref 83–108)

## 2024-01-11 LAB — BASIC METABOLIC PANEL WITH GFR
Anion gap: 14 (ref 5–15)
BUN: 45 mg/dL — ABNORMAL HIGH (ref 8–23)
CO2: 20 mmol/L — ABNORMAL LOW (ref 22–32)
Calcium: 9.1 mg/dL (ref 8.9–10.3)
Chloride: 102 mmol/L (ref 98–111)
Creatinine, Ser: 1.74 mg/dL — ABNORMAL HIGH (ref 0.44–1.00)
GFR, Estimated: 32 mL/min — ABNORMAL LOW (ref >60)
Glucose, Bld: 124 mg/dL — ABNORMAL HIGH (ref 70–99)
Potassium: 3.7 mmol/L (ref 3.5–5.1)
Sodium: 136 mmol/L (ref 135–145)

## 2024-01-11 LAB — MAGNESIUM
Magnesium: 2 mg/dL (ref 1.7–2.4)
Magnesium: 2 mg/dL (ref 1.7–2.4)

## 2024-01-11 LAB — CBC
HCT: 30.7 % — ABNORMAL LOW (ref 36.0–46.0)
Hemoglobin: 9.6 g/dL — ABNORMAL LOW (ref 12.0–15.0)
MCH: 26.2 pg (ref 26.0–34.0)
MCHC: 31.3 g/dL (ref 30.0–36.0)
MCV: 83.9 fL (ref 80.0–100.0)
Platelets: 262 K/uL (ref 150–400)
RBC: 3.66 MIL/uL — ABNORMAL LOW (ref 3.87–5.11)
RDW: 16.2 % — ABNORMAL HIGH (ref 11.5–15.5)
WBC: 11.1 K/uL — ABNORMAL HIGH (ref 4.0–10.5)
nRBC: 0.2 % (ref 0.0–0.2)

## 2024-01-11 LAB — BLOOD GAS, VENOUS
Acid-base deficit: 5.3 mmol/L — ABNORMAL HIGH (ref 0.0–2.0)
Bicarbonate: 22.8 mmol/L (ref 20.0–28.0)
O2 Saturation: 42.7 %
Patient temperature: 37
pCO2, Ven: 57 mmHg (ref 44–60)
pH, Ven: 7.21 — ABNORMAL LOW (ref 7.25–7.43)
pO2, Ven: 34 mmHg (ref 32–45)

## 2024-01-11 LAB — PHOSPHORUS
Phosphorus: 7.6 mg/dL — ABNORMAL HIGH (ref 2.5–4.6)
Phosphorus: 6 mg/dL — ABNORMAL HIGH (ref 2.5–4.6)

## 2024-01-11 MED ORDER — SODIUM CHLORIDE 0.9% FLUSH
10.0000 mL | Freq: Three times a day (TID) | INTRAVENOUS | Status: DC
  Administered 2024-01-11 – 2024-01-17 (×19): 10 mL via INTRAPLEURAL

## 2024-01-11 MED ORDER — POTASSIUM CHLORIDE 10 MEQ/100ML IV SOLN
10.0000 meq | INTRAVENOUS | Status: AC
Start: 2024-01-11 — End: 2024-01-11
  Administered 2024-01-11 (×3): 10 meq via INTRAVENOUS
  Filled 2024-01-11 (×3): qty 100

## 2024-01-11 MED ORDER — SODIUM CHLORIDE (PF) 0.9 % IJ SOLN
10.0000 mg | Freq: Once | INTRAMUSCULAR | Status: AC
  Administered 2024-01-11: 10 mg via INTRAPLEURAL
  Filled 2024-01-11: qty 10

## 2024-01-11 MED ORDER — STERILE WATER FOR INJECTION IJ SOLN
5.0000 mg | Freq: Once | RESPIRATORY_TRACT | Status: AC
  Administered 2024-01-11: 5 mg via INTRAPLEURAL
  Filled 2024-01-11: qty 5

## 2024-01-11 MED ORDER — SODIUM CHLORIDE 0.9 % IV SOLN
2.0000 g | INTRAVENOUS | Status: DC
  Administered 2024-01-12 – 2024-01-20 (×9): 2 g via INTRAVENOUS
  Filled 2024-01-11 (×9): qty 20

## 2024-01-11 NOTE — Progress Notes (Signed)
 NAME:  Renee Gutierrez, MRN:  [id number], DOB:  1960-09-15, LOS: 1 ADMISSION DATE:  01/10/2024, CONSULTATION DATE:  @TODAY @ REFERRING MD:  [facility], CHIEF COMPLAINT:  acute hypoxic respiratory failure   History of Present Illness:  Renee Gutierrez is a 63 y/o F with a PMH significant for HTN, fibromyalgia, GERD, emphysema who presented to [facility] ED for severe chest pain, somnolence, and hypoxia found to have RLL pneumonia, R secondary spontaneous pneumothorax, and concern for opiate toxidrome with ED course c/b worsening encephalopathy requiring invasive mechanical ventilation transferred to [facility] for CCM evaluation.  Per ED notes, the patient presented with somnolence, hypoxia, hypotension. Initial concern for opiate toxidrome. O2 SATS 69%. She was given small dose of narcan  with decent response, more alert and breathing, O2 sats improved. Patient in more pain. CTA chest performed and showed RLL consolidation, R pleural effusion, and pneumothorax. A R pigtail chest tube was placed. The patient initially required NE and request was made to transfer patient to ICU at [facility] or [facility]. She was also placed on a narcan  drip for opiate toxidrome.  On 11/22 AM, ED provider switched and patient had a respiratory decompensation, increasingly agitated, pulling on O2 and Ivs, more hypoxic. Decision was made to intubate patient and a femoral central line was placed. The patient was slated to come to [facility] for ICU level care. 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     Significant Hospital Events: Including procedures, antibiotic start and stop dates in addition to other pertinent events   11/21 --> presented to [facility] ED, opiate toxidrome, narcan  drip, CTA chest showed R pneumo, chest tube placed 11/21 --> more altered, agitated, hypoxia, intubated, CVC placed, transferred to [facility]  Interim History / Subjective:  Unable to obtain due to patient's clinical status Afebrile, HR 60s - 70s, hemodynamics supported with pressors, SPO2 > 90% on PRVC 340/+5/40% Drips: NE 4, Propofol  50 I/O: + 3.3 L Lines/Tubes: R pigtail CT, ETT, OGT, R Fem CVC, PIV, Foley  Objective    Blood pressure (!) 112/50, pulse 75, temperature 98.4 F (36.9 C), temperature source Bladder, resp. rate 19, height 4' 11 (1.499 m), weight 46.2 kg, SpO2 98%.    Vent Mode: PRVC FiO2 (%):  [40 %-100 %] 40 % Set Rate:  [18 bmp-20 bmp] 20 bmp Vt Set:  [340 mL] 340 mL PEEP:  [5 cmH20] 5 cmH20 Plateau Pressure:  [14 cmH20-18 cmH20] 17 cmH20   Intake/Output Summary (Last 24 hours) at 01/11/2024 0745 Last data filed at 01/11/2024 9271 Gross per 24 hour  Intake 4556.34 ml  Output 840 ml  Net 3716.34 ml   Filed Weights   01/10/24 0443 01/10/24 1208  Weight: 43.1 kg 46.2 kg    Examination: General: chronically ill appearing Eyes: hazy left pupil, anisocoria ENMT: intubated Skin: warm, intact, no rashes Neck: JVD flat CV: RRR, no MRG, nl S1 and S2, no peripheral edema Resp: clear to  auscultation anteriorly, chest tube draining brown turbid fluid, no air leak Abdom: Normoactive bowel sounds, soft, nontender, nondistended, no hepatosplenomegaly Neuro: awakens to voice and tactile stimulation, follows commands  Resolved problem list   Assessment and Plan   Cardiovascular:  #Shock: likey septic due to CAP and empyema #Community Acquired Pneumonia #Empyema - Levophed  gtt - Continue Ceftriaxone  and Azithromycin  - Continue Micafungin  given budding yeast in gram stain - Steroids  per CAPE COD trial - Target MAP > 65 mmHg - Pleural fluid gram stain with budding yeast and GPC in chains and clusters. - FU culture data and de-escalate antibiotics as tolerated - RVP and MRSA swab negative  #Acute Hypoxic Respiratory Failure: likely due to CAP, empyema, R pneumothorax. - Prevent lung injury - Low tidal volume ventilation - Aim for Pplat < 30 and DP < 15 - Lowest FiO2 to keep SPO2 > 90% and PaO2 > 65 mmHg - Permissive hypercapnia - pH goal > 7.2 - Daily SAT/SBT - ICS/LAMA/LABA - Hypertonic saline Q 8 H - Chest PT - Abx per above.  #Secondary Spontaneous Pneumothorax: - Daily CXR - CT to - 20 suction - Will not remove pigatil until output < 150 - Will plan to give TPA/Dornase today  #Stress Ulcer PPx: PPI  #Nutrition: NPO w/ meds/sips  #AKI: likely pre-renal due to shock state; improving with IVF resuscitation. - Renally adjust medications - Avoid Nephrotoxins - Strict I/Os - Foley catheter  #High anion gap metabolic acidosis: likely due to uremia. - ABG today to assess pH and decide on further   #VTE ppx: Heparin  subQ TID  #Acute Metabolic Encephalopathy: likely due to septic shock, uremia etc... CT head negative for acute pathology - Supportive care as above  #PADs: - Propofol  drip - Fentanyl  25 mcg Q 1 H PRN agitation pushes.  Disposition: ICU appropriate for vent support and vasopressors  Labs   CBC: Recent Labs  Lab 01/05/24 0409 01/06/24 0431 01/10/24 0450 01/10/24 1333 01/11/24 0303  WBC 10.5 10.8* 13.7* 4.3 11.1*  NEUTROABS  --   --   --  3.3  --   HGB 11.6* 11.0* 10.6* 10.9* 9.6*  HCT 38.5 35.6* 34.5* 34.2* 30.7*  MCV 87.5 85.4 88.0 84.0 83.9  PLT 634* 631* 442* 342 262    Basic Metabolic Panel: Recent Labs  Lab 01/05/24 0409 01/06/24 0431 01/10/24 0450 01/10/24 0939 01/10/24 1333 01/11/24 0303  NA 141 140 130*  --  131* 132*  K 4.9 4.0 4.7  --  3.9 4.1  CL 108 105 92*  --  97* 99  CO2 25 26 18*  --  19* 18*   GLUCOSE 86 97 85  --  122* 127*  BUN 14 10 43*  --  44* 46*  CREATININE 0.76 0.66 4.24*  --  3.40* 2.61*  CALCIUM  9.3 9.5 9.1  --  8.3* 8.8*  MG 2.1 2.1  --  2.2  --  2.0  PHOS  --   --   --  8.8*  --  7.6*   GFR: Estimated Creatinine Clearance: 14.9 mL/min (A) (by C-G formula based on SCr of 2.61 mg/dL (H)). Recent Labs  Lab 01/06/24 0431 01/10/24 0450 01/10/24 0630 01/10/24 0843 01/10/24 0940 01/10/24 1333 01/11/24 0303  WBC 10.8* 13.7*  --   --   --  4.3 11.1*  LATICACIDVEN  --   --  1.6 0.9 2.2*  --   --     Liver Function Tests: Recent Labs  Lab 01/10/24  0450 01/10/24 1333 01/11/24 0303  AST 172* 155* 101*  ALT 43 43 37  ALKPHOS 64 56 54  BILITOT 0.3 0.3 0.3  PROT 5.8* 5.1* 5.2*  ALBUMIN  3.3* 2.6* 2.5*   Recent Labs  Lab 01/10/24 0450  LIPASE 13   No results for input(s): AMMONIA in the last 168 hours.  ABG    Component Value Date/Time   PHART 7.12 (LL) 01/10/2024 0940   PCO2ART 48 01/10/2024 0940   PO2ART 222 (H) 01/10/2024 0940   HCO3 16.1 (L) 01/10/2024 0940   TCO2 21 (L) 01/03/2024 1336   ACIDBASEDEF 13.6 (H) 01/10/2024 0940   O2SAT 100 01/10/2024 0940     Coagulation Profile: Recent Labs  Lab 01/10/24 0939  INR 0.9    Cardiac Enzymes: No results for input(s): CKTOTAL, CKMB, CKMBINDEX, TROPONINI in the last 168 hours.  HbA1C: No results found for: HGBA1C  CBG: Recent Labs  Lab 01/10/24 1639 01/10/24 2308 01/11/24 0022 01/11/24 0405  GLUCAP 116* 129* 127* 142*    Review of Systems:   Not obtained  Past Medical History:  She,  has a past medical history of Anxiety, Arthritis, AVM (arteriovenous malformation) brain (10/2015), Benign tumor of adrenal gland, Benign tumor of adrenal gland, Fibromyalgia, GERD (gastroesophageal reflux disease), Hiatal hernia, Hyperlipidemia, Hypertension, IBS (irritable bowel syndrome), Migraines, S/P Nissen fundoplication (without gastrostomy tube) procedure, Sciatica, and Trigeminal  (5th) nerve injury.   Surgical History:   Past Surgical History:  Procedure Laterality Date   ABDOMINAL HYSTERECTOMY     ABDOMINAL SURGERY     tumor removed   APPENDECTOMY     BRAIN SURGERY     to fix AVM   CEREBRAL ANEURYSM REPAIR     CERVICAL FUSION     CESAREAN SECTION     CHOLECYSTECTOMY     COLONOSCOPY N/A 12/31/2023   Procedure: COLONOSCOPY;  Surgeon: Shaaron Lamar HERO, MD;  [facility];  Service: Endoscopy;  Laterality: N/A;   ESOPHAGEAL DILATION N/A 11/28/2023   Procedure: DILATION, ESOPHAGUS;  Surgeon: Cindie Carlin POUR, DO;  [facility];  Service: Endoscopy;  Laterality: N/A;   ESOPHAGOGASTRODUODENOSCOPY N/A 11/28/2023   Procedure: EGD (ESOPHAGOGASTRODUODENOSCOPY);  Surgeon: Cindie Carlin POUR, DO;  [facility];  Service: Endoscopy;  Laterality: N/A;   ESOPHAGOGASTRODUODENOSCOPY N/A 12/05/2023   Procedure: EGD (ESOPHAGOGASTRODUODENOSCOPY);  Surgeon: Eartha Flavors, Toribio, MD;  [facility];  Service: Gastroenterology;  Laterality: N/A;   ESOPHAGOGASTRODUODENOSCOPY N/A 01/05/2024   Procedure: EGD (ESOPHAGOGASTRODUODENOSCOPY);  Surgeon: Cindie Carlin POUR, DO;  [facility];  Service: Endoscopy;  Laterality: N/A;   LUNG SURGERY     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     Social History:   reports that she has been smoking cigarettes. She has never used smokeless tobacco. She reports that she does not drink alcohol  and does not use drugs.   Family History:  Her family history includes Bladder Cancer in her father; Liver cancer in her father; Throat cancer in her mother. There is no history of Colon cancer.   Allergies Allergies  Allergen Reactions   Buprenorphine Hcl Swelling    All extremities and neck and face swelled up huge according to patient   Bupropion Swelling and Other (See Comments)    Moody and depressed   Codeine Itching, Nausea And Vomiting, Nausea Only and  Swelling   Sulfamethoxazole-Trimethoprim Swelling and Other (See Comments)    Swelled up  her eye   Nsaids Other (See Comments)    Unknown    Pregabalin  Swelling    Lyrica     Sulfa Antibiotics Other (See Comments)    Unknown    Tapentadol Nausea And Vomiting    Pt states she can take   Clindamycin Other (See Comments)    Heart burn   Clindamycin/Lincomycin Rash and Other (See Comments)    Heart burn   Cymbalta  [Duloxetine  Hcl] Swelling   Diclofenac Swelling    Cream    Diclofenac Sodium Swelling    Oral med   Gabapentin Swelling   Oxycodone  Nausea Only    No longer has a reaction per pt   Toradol  [Ketorolac  Tromethamine ] Rash     Home Medications  Prior to Admission medications   Medication Sig Start Date End Date Taking? Authorizing Provider  amLODipine  (NORVASC ) 10 MG tablet Take 1 tablet (10 mg total) by mouth daily. 12/15/23   Arrien, Mauricio Daniel, MD  DULoxetine  (CYMBALTA ) 60 MG capsule Take 60 mg by mouth 2 (two) times daily. 05/16/20   [provider]  HYDROmorphone  (DILAUDID ) 4 MG tablet Take 4 mg by mouth every 8 (eight) hours as needed for severe pain (pain score 7-10).    [provider]  labetalol  (NORMODYNE ) 300 MG tablet Take 1 tablet (300 mg total) by mouth 2 (two) times daily. 01/01/24   Evonnie Lenis, MD  lisinopril  (ZESTRIL ) 20 MG tablet Take 1 tablet (20 mg total) by mouth daily. 01/02/24   Evonnie Lenis, MD  morphine  (MSIR) 15 MG tablet Take 15 mg by mouth every 4 (four) hours as needed for severe pain (pain score 7-10).    [provider]  ondansetron  (ZOFRAN -ODT) 8 MG disintegrating tablet Take 1 tablet (8 mg total) by mouth every 6 (six) hours as needed for nausea or vomiting. 11/29/23   Darci Pore, MD  pantoprazole  (PROTONIX ) 40 MG tablet Take 1 tablet (40 mg total) by mouth 2 (two) times daily. 11/29/23   Darci Pore, MD  phenytoin  (DILANTIN ) 100 MG ER capsule Take 1 capsule (100 mg total) by mouth 2 (two) times  daily. 01/06/24   Arlon Carliss ORN, DO  prednisoLONE  acetate (PRED FORTE ) 1 % ophthalmic suspension Place 1 drop into the left eye in the morning and at bedtime. 12/14/23   Arrien, Mauricio Daniel, MD  pregabalin  (LYRICA ) 50 MG capsule Take 50 mg by mouth See admin instructions. Take 1 capsule by mouth every night at bedtime for 1 week then 1 capsule twice daily for 1 week then 1 capsule three times daily.    [provider]  prochlorperazine  (COMPAZINE ) 10 MG tablet Take 1 tablet (10 mg total) by mouth every 6 (six) hours as needed for nausea or vomiting. 12/24/23   Bryn Bernardino NOVAK, MD  rOPINIRole  (REQUIP ) 2 MG tablet Take 4 mg by mouth See admin instructions. Take 1 tablet in the morning and 2 tablet every evening. 11/11/14   [provider]  simethicone  (GAS RELIEF EXTRA STRENGTH) 125 MG chewable tablet Chew 125 mg by mouth daily as needed for flatulence.  05/24/14   [provider]  spironolactone  (ALDACTONE ) 25 MG tablet Take 1 tablet (25 mg total) by mouth daily. 12/14/23   Arrien, Elidia Sieving, MD  sucralfate  (CARAFATE ) 1 GM/10ML suspension Take 10 mLs (1 g total) by mouth 4 (four) times daily -  with meals and at bedtime. 12/07/23   Evonnie Lenis, MD     Due to a high probability of clinically  significant, life threatening deterioration, the patient required my highest level of preparedness to intervene emergently and I personally spent this critical care time directly and personally managing the patient. This critical care time included obtaining a history; examining the patient; pulse oximetry; ordering and review of studies; arranging urgent treatment with development of a management plan; evaluation of patient's response to treatment; frequent reassessment; and, discussions with other providers.  This critical care time was performed to assess and manage the high probability of imminent, life-threatening deterioration that could result in multi-organ failure. It was  exclusive of separately billable procedures and treating other patients and teaching time. Critical Care Time: 60 minutes.  Paula Southerly, MD [facility]

## 2024-01-11 NOTE — Procedures (Signed)
 Pleural Fibrinolytic Administration Procedure Note  Renee Gutierrez  [id number]  [birth year]  Date:01/11/24  Time:12:05 PM   Provider Performing:Karesha Trzcinski CATHERINE   Procedure: Pleural Fibrinolysis Initial day (67438)  Indication(s) Fibrinolysis of complicated pleural effusion  Consent Risks of the procedure as well as the alternatives and risks of each were explained to the patient and/or caregiver.  Consent for the procedure was obtained.   Anesthesia None   Time Out Verified patient identification, verified procedure, site/side was marked, verified correct patient position, special equipment/implants available, medications/allergies/relevant history reviewed, required imaging and test results available.   Sterile Technique Hand hygiene, gloves   Procedure Description Existing pleural catheter was cleaned and accessed in sterile manner.  10mg  of tPA in 30cc of saline and 5mg  of dornase in 30cc of sterile water  were injected into pleural space using existing pleural catheter.  Catheter will be clamped for 1 hour and then placed back to suction.   Complications/Tolerance None; patient tolerated the procedure well.  EBL None   Specimen(s) None

## 2024-01-11 NOTE — Plan of Care (Signed)
  Problem: Health Behavior/Discharge Planning: Goal: Ability to manage health-related needs will improve Outcome: Progressing   Problem: Clinical Measurements: Goal: Ability to maintain clinical measurements within normal limits will improve Outcome: Progressing Goal: Will remain free from infection Outcome: Progressing Goal: Diagnostic test results will improve Outcome: Progressing Goal: Respiratory complications will improve Outcome: Progressing Goal: Cardiovascular complication will be avoided Outcome: Progressing   Problem: Clinical Measurements: Goal: Will remain free from infection Outcome: Progressing   

## 2024-01-11 NOTE — Progress Notes (Signed)
 Chest tube does not have a luer lock attached. Unable to flush chest tube. Charge nurse verified and e-link notified at 0015.

## 2024-01-11 NOTE — Progress Notes (Signed)
 eLink Physician-Brief Progress Note Patient Name: Renee Gutierrez DOB: 09-Oct-1960 MRN: [id number]   Date of Service  01/11/2024  HPI/Events of Note  Has a chest tube with an order that says to flush q8. There is no luer lock on the chest tube. Unable to flush per order.   eICU Interventions  Consider adding Luer-Lok in the morning     Intervention Category Minor Interventions: Routine modifications to care plan (e.g. PRN medications for pain, fever)  Marnisha Stampley 01/11/2024, 12:24 AM

## 2024-01-11 NOTE — Progress Notes (Signed)
 eLink Physician-Brief Progress Note Patient Name: Renee Gutierrez DOB: 1960-11-16 MRN: [id number]   Date of Service  01/11/2024  HPI/Events of Note  LR order expired but still running  eICU Interventions  Dc LR     Intervention Category Minor Interventions: Routine modifications to care plan (e.g. PRN medications for pain, fever)  Gumaro Brightbill 01/11/2024, 8:54 PM

## 2024-01-12 ENCOUNTER — Inpatient Hospital Stay: Admitting: Gastroenterology

## 2024-01-12 ENCOUNTER — Inpatient Hospital Stay (HOSPITAL_COMMUNITY)

## 2024-01-12 DIAGNOSIS — J9601 Acute respiratory failure with hypoxia: Secondary | ICD-10-CM | POA: Diagnosis not present

## 2024-01-12 DIAGNOSIS — E441 Mild protein-calorie malnutrition: Secondary | ICD-10-CM | POA: Diagnosis not present

## 2024-01-12 DIAGNOSIS — J869 Pyothorax without fistula: Secondary | ICD-10-CM | POA: Diagnosis not present

## 2024-01-12 DIAGNOSIS — A419 Sepsis, unspecified organism: Secondary | ICD-10-CM | POA: Diagnosis not present

## 2024-01-12 DIAGNOSIS — R6521 Severe sepsis with septic shock: Secondary | ICD-10-CM | POA: Diagnosis not present

## 2024-01-12 LAB — BASIC METABOLIC PANEL WITH GFR
Anion gap: 13 (ref 5–15)
BUN: 43 mg/dL — ABNORMAL HIGH (ref 8–23)
CO2: 20 mmol/L — ABNORMAL LOW (ref 22–32)
Calcium: 9.2 mg/dL (ref 8.9–10.3)
Chloride: 101 mmol/L (ref 98–111)
Creatinine, Ser: 1.35 mg/dL — ABNORMAL HIGH (ref 0.44–1.00)
GFR, Estimated: 44 mL/min — ABNORMAL LOW (ref >60)
Glucose, Bld: 111 mg/dL — ABNORMAL HIGH (ref 70–99)
Potassium: 3.7 mmol/L (ref 3.5–5.1)
Sodium: 134 mmol/L — ABNORMAL LOW (ref 135–145)

## 2024-01-12 LAB — CULTURE, RESPIRATORY W GRAM STAIN: Culture: NORMAL

## 2024-01-12 LAB — BODY FLUID CELL COUNT WITH DIFFERENTIAL: Total Nucleated Cell Count, Fluid: 2075 uL — ABNORMAL HIGH (ref 0–1000)

## 2024-01-12 LAB — GLUCOSE, CAPILLARY
Glucose-Capillary: 110 mg/dL — ABNORMAL HIGH (ref 70–99)
Glucose-Capillary: 108 mg/dL — ABNORMAL HIGH (ref 70–99)
Glucose-Capillary: 105 mg/dL — ABNORMAL HIGH (ref 70–99)
Glucose-Capillary: 112 mg/dL — ABNORMAL HIGH (ref 70–99)
Glucose-Capillary: 118 mg/dL — ABNORMAL HIGH (ref 70–99)
Glucose-Capillary: 126 mg/dL — ABNORMAL HIGH (ref 70–99)
Glucose-Capillary: 111 mg/dL — ABNORMAL HIGH (ref 70–99)

## 2024-01-12 LAB — LEGIONELLA PNEUMOPHILA SEROGP 1 UR AG: L. pneumophila Serogp 1 Ur Ag: NEGATIVE

## 2024-01-12 LAB — PH, BODY FLUID: pH, Body Fluid: 8

## 2024-01-12 LAB — TRIGLYCERIDES: Triglycerides: 205 mg/dL — ABNORMAL HIGH (ref <150)

## 2024-01-12 MED ORDER — PHENYTOIN 125 MG/5ML PO SUSP
100.0000 mg | Freq: Two times a day (BID) | ORAL | Status: DC
  Administered 2024-01-12 – 2024-01-13 (×3): 100 mg
  Filled 2024-01-12 (×3): qty 4

## 2024-01-12 MED ORDER — SUCRALFATE 1 GM/10ML PO SUSP
1.0000 g | Freq: Three times a day (TID) | ORAL | Status: DC
  Administered 2024-01-12 – 2024-01-13 (×4): 1 g
  Filled 2024-01-12 (×5): qty 10

## 2024-01-12 MED ORDER — AMLODIPINE BESYLATE 10 MG PO TABS
10.0000 mg | ORAL_TABLET | Freq: Every day | ORAL | Status: DC
  Administered 2024-01-12 – 2024-01-13 (×2): 10 mg
  Filled 2024-01-12 (×2): qty 1

## 2024-01-12 MED ORDER — LABETALOL HCL 200 MG PO TABS
300.0000 mg | ORAL_TABLET | Freq: Two times a day (BID) | ORAL | Status: DC
  Administered 2024-01-12 – 2024-01-13 (×3): 300 mg
  Filled 2024-01-12 (×3): qty 2

## 2024-01-12 MED ORDER — STERILE WATER FOR INJECTION IJ SOLN
5.0000 mg | Freq: Once | RESPIRATORY_TRACT | Status: AC
  Administered 2024-01-12: 5 mg via INTRAPLEURAL
  Filled 2024-01-12: qty 5

## 2024-01-12 MED ORDER — LABETALOL HCL 5 MG/ML IV SOLN
5.0000 mg | INTRAVENOUS | Status: DC | PRN
  Administered 2024-01-12: 5 mg via INTRAVENOUS
  Administered 2024-01-13 (×2): 10 mg via INTRAVENOUS
  Filled 2024-01-12 (×3): qty 4

## 2024-01-12 MED ORDER — PREGABALIN 25 MG PO CAPS
50.0000 mg | ORAL_CAPSULE | Freq: Three times a day (TID) | ORAL | Status: DC
  Administered 2024-01-12 – 2024-01-13 (×3): 50 mg
  Filled 2024-01-12 (×3): qty 2

## 2024-01-12 MED ORDER — SODIUM CHLORIDE (PF) 0.9 % IJ SOLN
10.0000 mg | Freq: Once | INTRAMUSCULAR | Status: AC
  Administered 2024-01-12: 10 mg via INTRAPLEURAL
  Filled 2024-01-12: qty 10

## 2024-01-12 MED ORDER — VITAL HP 1.0 CAL PO LIQD
1000.0000 mL | ORAL | Status: DC
  Administered 2024-01-12: 1000 mL

## 2024-01-12 MED ORDER — OXYCODONE HCL 5 MG PO TABS
5.0000 mg | ORAL_TABLET | Freq: Four times a day (QID) | ORAL | Status: DC
Start: 2024-01-12 — End: 2024-01-13
  Administered 2024-01-12 – 2024-01-13 (×4): 5 mg
  Filled 2024-01-12 (×4): qty 1

## 2024-01-12 NOTE — TOC Initial Note (Signed)
 Transition of Care [facility]) - Initial/Assessment Note    Patient Details  Name: Renee Gutierrez MRN: [id number] Date of Birth: 1960/05/12  Transition [facility]) CM/SW Contact:    Jon ONEIDA Anon, RN Phone Number: 01/12/2024, 11:13 AM  Clinical Narrative:                 Pt admitted with hypoxia, hypotension and had chest tube [facility] prior to transferring to [facility] Pt intubated, on the ventilator at this time. Pt needing continued medial workup, not medically stable for discharge. IP CM will continue to follow for any DC planning needs.     Expected Discharge Plan:  (TBD) Barriers to Discharge: Continued Medical Work up   Patient Goals and CMS Choice Patient states their goals for this hospitalization and ongoing recovery are:: UTA due to pt being on the ventilator CMS Medicare.gov Compare Post Acute Care list provided to:: Other (Comment Required) Choice offered to / list presented to : NA [place] ownership interest in [facility].provided to:: Parent NA    Expected Discharge Plan and Services In-house Referral: NA Discharge Planning Services: CM Consult Post Acute Care Choice: Durable Medical Equipment Living arrangements for the past 2 months: Single Family Home                 DME Arranged: N/A DME Agency: NA       HH Arranged: NA HH Agency: NA        Prior Living Arrangements/Services Living arrangements for the past 2 months: Single Family Home Lives with:: Spouse Patient language and need for interpreter reviewed:: Yes        Need for Family Participation in Patient Care: Yes (Comment) Care giver support system in place?: Yes (comment) Current home services: DME Criminal Activity/Legal Involvement Pertinent to Current Situation/Hospitalization: No - Comment as needed  Activities of Daily Living      Permission Sought/Granted Permission sought to share information with : Family Supports    Share  Information with NAME: Malikah, Principato (Spouse)  [id number]           Emotional Assessment Appearance:: Appears stated age Attitude/Demeanor/Rapport: Unable to Assess Affect (typically observed): Unable to Assess Orientation: :  (UTA, pt on mechanical ventilation at this time) Alcohol / Substance Use: Not Applicable Psych Involvement: No (comment)  Admission diagnosis:  Shock (HCC) [R57.9] Septic shock (HCC) [A41.9, R65.21] Acute renal failure, unspecified acute renal failure type [N17.9] Pneumothorax, unspecified type [J93.9] Pneumonia due to infectious organism, unspecified laterality, unspecified part of lung [J18.9] Acute hypoxic respiratory failure (HCC) [J96.01] Patient Active Problem List   Diagnosis Date Noted   Chronic gastric ulcer without hemorrhage and without perforation 01/11/2024   Abdominal pain, chronic, epigastric 01/11/2024   Septic shock (HCC) 01/10/2024   Acute hypoxic respiratory failure (HCC) 01/10/2024   Uncomplicated opioid dependence (HCC) 01/01/2024   Nausea vomiting and diarrhea 01/01/2024   Rectal bleeding 12/31/2023   Intractable nausea and vomiting 12/23/2023   Iron deficiency anemia due to chronic blood loss 12/17/2023   Acute on chronic diastolic CHF (congestive heart failure) (HCC) 12/14/2023   Peptic ulcer disease 12/14/2023   Bilateral lower extremity edema 12/12/2023   Hypophosphatemia 12/12/2023   Upper abdominal pain 12/07/2023   GIB (gastrointestinal bleeding) 12/04/2023   Acute gastric ulcer with hemorrhage 11/28/2023   Acute post-hemorrhagic anemia 11/27/2023   Esophageal hiatal hernia 11/26/2023   Essential hypertension 11/26/2023   Chronic anemia 11/02/2023   Trigeminal neuralgia 05/13/2023  Anxiety 05/30/2020   AVM (arteriovenous malformation) brain 05/30/2020   External otitis of left ear 05/30/2020   Hypokalemia 05/30/2020   Hyperlipidemia 05/30/2020   History of intussusception 05/30/2020   Migraines 05/30/2020    Restless leg syndrome 05/30/2020   CKD (chronic kidney disease), 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/2020   Intussusception (HCC) 05/30/2020   Medicare annual wellness visit, subsequent 05/30/2020   MRSA (methicillin resistant staph aureus) culture positive 05/30/2020   Rash 05/30/2020   Prediabetes 05/30/2020   Nausea & vomiting 05/30/2020   Chronic hip pain 05/30/2020   IBS (irritable bowel syndrome) 05/30/2020   Long-term current use of opiate analgesic 11/19/2018   ACTH elevation 12/12/2016   Trigeminal neuralgia pain 07/31/2016   Resistant hypertension 02/18/2016   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   Ganglion cyst of dorsum of right wrist 09/13/2015   Mild intermittent asthma without complication 04/25/2015   Idiopathic peripheral neuropathy 11/22/2014   Adrenal adenoma, right 07/10/2014   Prolactinoma (HCC) 07/07/2014   Osteoarthritis of cervical spine 06/06/2014   Cubital tunnel syndrome 08/24/2013   Spinal stenosis of lumbar region with neurogenic claudication 08/03/2013   GAD (generalized anxiety disorder) 09/12/2011   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 [place], [place] - [address] [place] [address] Phone: [phone] Fax: [phone]     Social Drivers of Health (SDOH) Social History: SDOH Screenings   Food Insecurity: No Food Insecurity (01/04/2024)  Recent Concern: Food Insecurity - Food Insecurity Present (12/28/2023)  Housing: Unknown (01/11/2024)  Transportation Needs: Patient Unable To Answer (01/11/2024)   Utilities: Patient Unable To Answer (01/11/2024)  Tobacco Use: High Risk (01/11/2024)   SDOH Interventions:     Readmission Risk Interventions    01/12/2024   11:08 AM 01/04/2024    1:15 PM 12/29/2023    2:24 PM  Readmission Risk Prevention Plan  Transportation Screening Complete Complete Complete  Medication Review Oceanographer) Complete Complete Complete  PCP or Specialist appointment within 3-5 days of discharge Complete Not Complete   HRI or Home Care Consult Complete Complete Complete  SW Recovery Care/Counseling Consult Complete Complete Complete  Palliative Care Screening Not Applicable Not Applicable Not Applicable  Skilled Nursing Facility Not Applicable Not Applicable Not Applicable

## 2024-01-12 NOTE — Progress Notes (Signed)
 Changed filters on ventilator due to auto-peeping on ventilator. Patient returned to baseline post filter change.

## 2024-01-12 NOTE — Procedures (Signed)
 Pleural Fibrinolytic Administration Procedure Note  MIQUELA COSTABILE  [id number]  Jul 12, 1960  Date:01/12/24  Time:11:23 AM   Provider Performing:Tuana Hoheisel D Emilio   Procedure: Pleural Fibrinolysis Subsequent day [phone])  Indication(s) Fibrinolysis of complicated pleural effusion  Consent Risks of the procedure as well as the alternatives and risks of each were explained to the patient and/or caregiver.  Consent for the procedure was obtained.   Anesthesia None   Time Out Verified patient identification, verified procedure, site/side was marked, verified correct patient position, special equipment/implants available, medications/allergies/relevant history reviewed, required imaging and test results available.   Sterile Technique Hand hygiene, gloves   Procedure Description Existing pleural catheter was cleaned and accessed in sterile manner.  10mg  of tPA in 30cc of saline and 5mg  of dornase in 30cc of sterile water  were injected into pleural space using existing pleural catheter.  Catheter will be clamped for 1 hour and then placed back to suction.   Complications/Tolerance None; patient tolerated the procedure well.   EBL None   Specimen(s) None  JD Emilio RIGGERS [facility] 01/12/2024, 11:23 AM  Please see [email or web address] for pager details.  From 7A-7P if no response, please call [phone]. After hours, please call ELink [phone].

## 2024-01-12 NOTE — Progress Notes (Signed)
 NAME:  Renee Gutierrez, MRN:  [id number], DOB:  1960/07/14, LOS: 2 ADMISSION DATE:  01/10/2024, CONSULTATION DATE:  @TODAY @ REFERRING MD:  [facility], CHIEF COMPLAINT:  acute hypoxic respiratory failure   History of Present Illness:  Ms. Renee Gutierrez is a 63 y/o F with a PMH significant for HTN, fibromyalgia, GERD, emphysema who presented to [facility] ED for severe chest pain, somnolence, and hypoxia found to have RLL pneumonia, R secondary spontaneous pneumothorax, and concern for opiate toxidrome with ED course c/b worsening encephalopathy requiring invasive mechanical ventilation transferred to [facility] for CCM evaluation.  Per ED notes, the patient presented with somnolence, hypoxia, hypotension. Initial concern for opiate toxidrome. O2 SATS 69%. She was given small dose of narcan  with decent response, more alert and breathing, O2 sats improved. Patient in more pain. CTA chest performed and showed RLL consolidation, R pleural effusion, and pneumothorax. A R pigtail chest tube was placed. The patient initially required NE and request was made to transfer patient to ICU at [facility] or [facility]. She was also placed on a narcan  drip for opiate toxidrome.  On 11/22 AM, ED provider switched and patient had a respiratory decompensation, increasingly agitated, pulling on O2 and Ivs, more hypoxic. Decision was made to intubate patient and a femoral central line was placed. The patient was slated to come to [facility] for ICU level care.  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 and presence of a nonbleeding gastric ulcer measuring 2 cm and with a nonbleeding visible vessel which was ablated with bipolar probe  11/21 --> presented to [facility] ED, opiate toxidrome, narcan  drip, CTA chest showed R pneumo, chest tube placed 11/21 --> more altered, agitated, hypoxia, intubated, CVC placed, transferred to [facility] 11/23 lytics x 1  Interim History / Subjective:   Critically ill, intubated Off pressors since 4 AM this morning Afebrile Sedated on propofol  Chest tube output 500 cc last 24 hours  Lines/Tubes: R pigtail CT, ETT, OGT, R Fem CVC, PIV, Foley  Objective    Blood pressure (!) 159/96, pulse 97, temperature 98.1 F (36.7 C), resp. rate (!) 21, height 4' 11 (1.499 m), weight 48.6 kg, SpO2 99%.    Vent Mode: PRVC FiO2 (%):  [40 %] 40 % Set Rate:  [20 bmp-24 bmp] 20 bmp Vt Set:  [350 mL] 350 mL PEEP:  [5 cmH20] 5 cmH20 Plateau Pressure:  [16 cmH20-24 cmH20] 16 cmH20   Intake/Output Summary (Last 24 hours) at 01/12/2024  9089 Last data filed at 01/12/2024 0641 Gross per 24 hour  Intake 2462.28 ml  Output 2175 ml  Net 287.28 ml   Filed Weights   01/10/24 1208 01/11/24 0717 01/12/24 0500  Weight: 46.2 kg 49.2 kg 48.6 kg    Examination: General: chronically ill appearing, sedated on propofol  Eyes: hazy left pupil opacity, anisocoria, right 2 mm reactive to light ENT: intubated Skin: warm, intact, no rashes Neck: JVD flat CV: RRR, no MRG, nl S1 and S2, no peripheral edema Resp: clear to auscultation  anteriorly, chest tube draining clear yellow fluid with chunks Abdom: Normoactive bowel sounds, soft, nontender, nondistended, no hepatosplenomegaly Neuro: RASS -3, on SAT follows commands  Labs show mild hyponatremia 134, improving renal function 43/1.3, mild hypokalemia 3.7, mild leukocytosis 11K Chest x-ray 11/23 shows small right effusion bibasilar opacities  Resolved problem list   Assessment and Plan   Cardiovascular:  # Septic shock:  due to CAP and empyema #Community Acquired Pneumonia #Empyema -strep mitis and rare yeast -favor aspiration - Off pressors - Continue Ceftriaxone  and dc Azithromycin  - Continue Micafungin  given budding yeast in gram stain, but doubt this is a real pathogen - dc Steroids  - Target MAP > 65 mmHg - FU culture data and de-escalate antibiotics as tolerated   #Acute Hypoxic Respiratory Failure: likely due to CAP, empyema, R pneumothorax.  - Low tidal volume ventilation - Aim for Pplat < 30 and DP < 15 - Lowest FiO2 to keep SPO2 > 90% and PaO2 > 65 mmHg - Daily SAT/SBT with goal extubation - ICS/LAMA/LABA - Hypertonic saline Q 8 H - Chest PT   #Secondary Spontaneous Pneumothorax: - Daily CXR - CT to - 20 suction - Will not remove pigatil until output < 150 - Repeat chest x-ray today and plan for second dose of lytics if needed  #Stress Ulcer PPx: PPI  # Mild protein calorie nutrition: Start tube feeds if not extubated today Intractable nausea and vomiting with recurrent hospital admissions -seen by GI and plan was to follow-up with general surgery/hernia specialist as outpatient at [facility] or [facility] for paraesophageal hernia  #AKI: likely pre-renal due to shock state; improving with IVF resuscitation. - Renally adjust medications - Avoid Nephrotoxins - Strict I/Os - Foley catheter - Replace hypokalemia  #High anion gap metabolic acidosis: likely due to uremia, resolving - Follow  #VTE ppx: Heparin  subQ TID  #Acute Metabolic  Encephalopathy: likely due to septic shock, uremia etc... CT head negative for acute pathology - Supportive care as above - Propofol  drip - Fentanyl  25 mcg Q 1 H PRN agitation pushes.  Disposition: ICU appropriate for vent support and vasopressors  Labs   CBC: Recent Labs  Lab 01/06/24 0431 01/10/24 0450 01/10/24 1333 01/11/24 0303  WBC 10.8* 13.7* 4.3 11.1*  NEUTROABS  --   --  3.3  --   HGB 11.0* 10.6* 10.9* 9.6*  HCT 35.6* 34.5* 34.2* 30.7*  MCV 85.4 88.0 84.0 83.9  PLT 631* 442* 342 262    Basic Metabolic Panel: Recent Labs  Lab 01/06/24 0431 01/10/24 0450 01/10/24 0939 01/10/24 1333 01/11/24 0303 01/11/24 1744 01/12/24 0540  NA 140 130*  --  131* 132* 136 134*  K 4.0 4.7  --  3.9 4.1 3.7 3.7  CL 105 92*  --  97* 99 102 101  CO2 26 18*  --  19* 18* 20* 20*  GLUCOSE 97 85  --  122* 127* 124* 111*  BUN 10 43*  --  44* 46* 45* 43*  CREATININE 0.66 4.24*  --  3.40* 2.61* 1.74* 1.35*  CALCIUM  9.5 9.1  --  8.3* 8.8* 9.1 9.2  MG 2.1  --  2.2  --  2.0 2.0  --   PHOS  --   --  8.8*  --  7.6* 6.0*  --    GFR: Estimated Creatinine Clearance: 28.7 mL/min (A) (by C-G formula based on SCr of 1.35 mg/dL (H)). Recent Labs  Lab 01/06/24 0431 01/10/24 0450 01/10/24 0630 01/10/24 0843 01/10/24 0940 01/10/24 1333 01/11/24 0303  WBC 10.8* 13.7*  --   --   --  4.3 11.1*  LATICACIDVEN  --   --  1.6 0.9 2.2*  --   --     Liver Function Tests: Recent Labs  Lab 01/10/24 0450 01/10/24 1333 01/11/24 0303  AST 172* 155* 101*  ALT 43 43 37  ALKPHOS 64 56 54  BILITOT 0.3 0.3 0.3  PROT 5.8* 5.1* 5.2*  ALBUMIN  3.3* 2.6* 2.5*   Recent Labs  Lab 01/10/24 0450  LIPASE 13   No results for input(s): AMMONIA in the last 168 hours.  ABG    Component Value Date/Time   PHART 7.28 (L) 01/11/2024 0857   PCO2ART 44 01/11/2024 0857   PO2ART 75 (L) 01/11/2024 0857   HCO3 22.8 01/11/2024 1102   TCO2 21 (L) 01/03/2024 1336   ACIDBASEDEF 5.3 (H) 01/11/2024 1102   O2SAT  42.7 01/11/2024 1102     Coagulation Profile: Recent Labs  Lab 01/10/24 0939  INR 0.9    Cardiac Enzymes: No results for input(s): CKTOTAL, CKMB, CKMBINDEX, TROPONINI in the last 168 hours.  HbA1C: No results found for: HGBA1C  CBG: Recent Labs  Lab 01/11/24 1544 01/11/24 2107 01/11/24 2346 01/12/24 0519 01/12/24 0730  GLUCAP 124* 115* 112* 108* 105*     My independent critical care time was 34 minutes  Harden Staff MD. FCCP. [facility] Pager : [phone]  If no response to pager , please call [phone] until 7 pm After 7:00 pm call Elink  [phone]   01/12/2024

## 2024-01-12 NOTE — Plan of Care (Signed)
  Problem: Pain Managment: Goal: General experience of comfort will improve and/or be controlled Outcome: Progressing   Problem: Skin Integrity: Goal: Risk for impaired skin integrity will decrease Outcome: Progressing   Problem: Education: Goal: Knowledge of General Education information will improve Description: Including pain rating scale, medication(s)/side effects and non-pharmacologic comfort measures Outcome: Not Progressing

## 2024-01-12 NOTE — Progress Notes (Signed)
 Initial Nutrition Assessment  DOCUMENTATION CODES:   Non-severe (moderate) malnutrition in context of chronic illness  INTERVENTION:  - Plan to start trickle tube feeds today via OGT: Vital High Protein @ 43mL/hr - holding 1 hour before and 2 hours after each Dilantin  administration.  - Monitor magnesium , potassium, and phosphorus daily for at least 3 days, MD to replete as needed, as pt is at risk for refeeding syndrome given malnutrition.   - If patient remains intubated and plan to advance tube feeds, recommend: Vital 1.5 at 50 ml/h - holding 1 hour before and 2 hours after each Dilantin  administration (total run time of 18 hours) *Would recommend starting at 82mL/hr and advancing by 10mL Q12H Prosource TF20 60 ml daily Provides 1430 kcal, 80 gm protein, 688 ml free water  daily  - FWF per CCM.   - Monitor weight trends.  NUTRITION DIAGNOSIS:   Moderate Malnutrition related to chronic illness as evidenced by moderate fat depletion, severe muscle depletion.  GOAL:   Patient will meet greater than or equal to 90% of their needs  MONITOR:   Vent status, Labs, Weight trends, TF tolerance  REASON FOR ASSESSMENT:   Consult Enteral/tube feeding initiation and management (Trickle TF)  ASSESSMENT:   63 y/o F with a PMH significant for HTN, fibromyalgia, GERD, emphysema who presented for severe chest pain, somnolence, and hypoxia found to have RLL pneumonia and right secondary spontaneous pneumothorax. Reportedly has been having recurrent admits for intractable N/V over the past 2 months.   11/22 Admit; Intubated  Patient is currently intubated on ventilator support MV: 7.5 L/min Temp (24hrs), Avg:98.7 F (37.1 C), Min:97.5 F (36.4 C), Max:99.7 F (37.6 C)  No family at bedside at time of visit.  Per chart review, weight has fluctuated between 90-107# over the past year.  Per discussion with RN, initially hopeful for extubation today but patient started having a lot of  anxiety when sedation was turned off so CCM decided to keep her intubated for now. OGT xray verified in the stomach.  Received consult to initiate trickle tube feeds today. Vital HP @ 77mL/hr initiated. It is noted patient is on Dilantin , which due to potential drug/nutrient interaction (could lower absorption of medication) tube feeds need to be held 1 hour before and 2 hours after medication administration. Discussed with MD and RN.    Medications reviewed and include: Protonix , Dilantin  BID, Carafate  Propofol  @ 9.05 mL/hr (provides 238 kcals over 24 hours)  Labs reviewed:  Na 134 Creatinine 1.35   NUTRITION - FOCUSED PHYSICAL EXAM:  Flowsheet Row Most Recent Value  Orbital Region Moderate depletion  Upper Arm Region Moderate depletion  Thoracic and Lumbar Region Moderate depletion  Buccal Region Unable to assess  Temple Region Severe depletion  Clavicle Bone Region Severe depletion  Clavicle and Acromion Bone Region Moderate depletion  Scapular Bone Region Unable to assess  Dorsal Hand Unable to assess  Patellar Region Mild depletion  Anterior Thigh Region Mild depletion  Posterior Calf Region Mild depletion  Edema (RD Assessment) None  Hair Reviewed  Eyes Unable to assess  Mouth Unable to assess  Skin Reviewed  Nails Reviewed    Diet Order:   Diet Order             Diet NPO time specified Except for: Sips with Meds  Diet effective now                   EDUCATION NEEDS:  Not appropriate for education at  this time  Skin:  Skin Assessment: Reviewed RN Assessment  Last BM:  PTA  Height:  Ht Readings from Last 1 Encounters:  01/10/24 4' 11 (1.499 m)   Weight:  Wt Readings from Last 1 Encounters:  01/12/24 48.6 kg   BMI:  Body mass index is 21.64 kg/m.  Estimated Nutritional Needs:  Kcal:  1300-1500 kcals Protein:  65-85 grams Fluid:  >/= 1.5L    Trude Ned RD, LDN Contact via Secure Chat.

## 2024-01-13 ENCOUNTER — Inpatient Hospital Stay (HOSPITAL_COMMUNITY)

## 2024-01-13 ENCOUNTER — Other Ambulatory Visit: Payer: Self-pay

## 2024-01-13 DIAGNOSIS — J9601 Acute respiratory failure with hypoxia: Secondary | ICD-10-CM

## 2024-01-13 DIAGNOSIS — E441 Mild protein-calorie malnutrition: Secondary | ICD-10-CM | POA: Diagnosis not present

## 2024-01-13 DIAGNOSIS — M7989 Other specified soft tissue disorders: Secondary | ICD-10-CM

## 2024-01-13 DIAGNOSIS — E440 Moderate protein-calorie malnutrition: Secondary | ICD-10-CM | POA: Insufficient documentation

## 2024-01-13 DIAGNOSIS — A419 Sepsis, unspecified organism: Secondary | ICD-10-CM | POA: Diagnosis not present

## 2024-01-13 DIAGNOSIS — R079 Chest pain, unspecified: Secondary | ICD-10-CM

## 2024-01-13 DIAGNOSIS — E43 Unspecified severe protein-calorie malnutrition: Secondary | ICD-10-CM | POA: Diagnosis present

## 2024-01-13 DIAGNOSIS — R6521 Severe sepsis with septic shock: Secondary | ICD-10-CM | POA: Diagnosis not present

## 2024-01-13 LAB — CBC WITH DIFFERENTIAL/PLATELET
Abs Immature Granulocytes: 0.38 K/uL — ABNORMAL HIGH (ref 0.00–0.07)
Basophils Absolute: 0 K/uL (ref 0.0–0.1)
Basophils Relative: 0 %
Eosinophils Absolute: 0 K/uL (ref 0.0–0.5)
Eosinophils Relative: 0 %
HCT: 25.2 % — ABNORMAL LOW (ref 36.0–46.0)
Hemoglobin: 7.9 g/dL — ABNORMAL LOW (ref 12.0–15.0)
Immature Granulocytes: 4 %
Lymphocytes Relative: 5 %
Lymphs Abs: 0.5 K/uL — ABNORMAL LOW (ref 0.7–4.0)
MCH: 26.1 pg (ref 26.0–34.0)
MCHC: 31.3 g/dL (ref 30.0–36.0)
MCV: 83.2 fL (ref 80.0–100.0)
Monocytes Absolute: 0.6 K/uL (ref 0.1–1.0)
Monocytes Relative: 6 %
Neutro Abs: 8.7 K/uL — ABNORMAL HIGH (ref 1.7–7.7)
Neutrophils Relative %: 85 %
Platelets: 196 K/uL (ref 150–400)
RBC: 3.03 MIL/uL — ABNORMAL LOW (ref 3.87–5.11)
RDW: 16.6 % — ABNORMAL HIGH (ref 11.5–15.5)
Smear Review: NORMAL
WBC: 10.2 K/uL (ref 4.0–10.5)
nRBC: 0 % (ref 0.0–0.2)

## 2024-01-13 LAB — CULTURE, BODY FLUID W GRAM STAIN -BOTTLE

## 2024-01-13 LAB — GLUCOSE, CAPILLARY
Glucose-Capillary: 118 mg/dL — ABNORMAL HIGH (ref 70–99)
Glucose-Capillary: 120 mg/dL — ABNORMAL HIGH (ref 70–99)
Glucose-Capillary: 122 mg/dL — ABNORMAL HIGH (ref 70–99)
Glucose-Capillary: 125 mg/dL — ABNORMAL HIGH (ref 70–99)
Glucose-Capillary: 135 mg/dL — ABNORMAL HIGH (ref 70–99)
Glucose-Capillary: 146 mg/dL — ABNORMAL HIGH (ref 70–99)

## 2024-01-13 LAB — PATHOLOGIST SMEAR REVIEW

## 2024-01-13 LAB — PHOSPHORUS: Phosphorus: 3.8 mg/dL (ref 2.5–4.6)

## 2024-01-13 LAB — BASIC METABOLIC PANEL WITH GFR
Anion gap: 10 (ref 5–15)
BUN: 41 mg/dL — ABNORMAL HIGH (ref 8–23)
CO2: 23 mmol/L (ref 22–32)
Calcium: 9.3 mg/dL (ref 8.9–10.3)
Chloride: 106 mmol/L (ref 98–111)
Creatinine, Ser: 1.02 mg/dL — ABNORMAL HIGH (ref 0.44–1.00)
GFR, Estimated: >60 mL/min (ref >60)
Glucose, Bld: 140 mg/dL — ABNORMAL HIGH (ref 70–99)
Potassium: 3.5 mmol/L (ref 3.5–5.1)
Sodium: 138 mmol/L (ref 135–145)

## 2024-01-13 LAB — MAGNESIUM: Magnesium: 2.3 mg/dL (ref 1.7–2.4)

## 2024-01-13 MED ORDER — PREGABALIN 75 MG PO CAPS
75.0000 mg | ORAL_CAPSULE | Freq: Three times a day (TID) | ORAL | Status: DC
  Administered 2024-01-13 (×2): 75 mg via ORAL
  Filled 2024-01-13 (×2): qty 1

## 2024-01-13 MED ORDER — LISINOPRIL 10 MG PO TABS: 20.0000 mg | ORAL_TABLET | Freq: Every day | ORAL | Status: DC

## 2024-01-13 MED ORDER — HYDRALAZINE HCL 20 MG/ML IJ SOLN
5.0000 mg | INTRAMUSCULAR | Status: DC | PRN
  Administered 2024-01-13 (×2): 20 mg via INTRAVENOUS
  Filled 2024-01-13 (×2): qty 1

## 2024-01-13 MED ORDER — POLYETHYLENE GLYCOL 3350 17 G PO PACK
17.0000 g | PACK | Freq: Every day | ORAL | Status: DC
  Filled 2024-01-13: qty 1

## 2024-01-13 MED ORDER — NICOTINE 14 MG/24HR TD PT24
14.0000 mg | MEDICATED_PATCH | Freq: Every day | TRANSDERMAL | Status: DC
  Administered 2024-01-13 – 2024-01-20 (×8): 14 mg via TRANSDERMAL
  Filled 2024-01-13 (×8): qty 1

## 2024-01-13 MED ORDER — PHENOL 1.4 % MT LIQD
1.0000 | OROMUCOSAL | Status: DC | PRN
  Filled 2024-01-13: qty 177

## 2024-01-13 MED ORDER — LABETALOL HCL 200 MG PO TABS
300.0000 mg | ORAL_TABLET | Freq: Two times a day (BID) | ORAL | Status: DC
  Administered 2024-01-13: 300 mg via ORAL
  Filled 2024-01-13: qty 2

## 2024-01-13 MED ORDER — HYDROMORPHONE HCL 1 MG/ML IJ SOLN
1.0000 mg | Freq: Once | INTRAMUSCULAR | Status: AC
  Administered 2024-01-13: 1 mg via INTRAVENOUS

## 2024-01-13 MED ORDER — LISINOPRIL 10 MG PO TABS
20.0000 mg | ORAL_TABLET | Freq: Every day | ORAL | Status: DC
  Administered 2024-01-13: 20 mg via ORAL
  Filled 2024-01-13: qty 2

## 2024-01-13 MED ORDER — HYDROMORPHONE HCL 1 MG/ML IJ SOLN
INTRAMUSCULAR | Status: AC
  Filled 2024-01-13: qty 1

## 2024-01-13 MED ORDER — OXYCODONE HCL 5 MG PO TABS
5.0000 mg | ORAL_TABLET | Freq: Four times a day (QID) | ORAL | Status: DC
  Administered 2024-01-13: 5 mg via ORAL
  Filled 2024-01-13: qty 1

## 2024-01-13 MED ORDER — FENTANYL CITRATE (PF) 50 MCG/ML IJ SOSY
12.5000 ug | PREFILLED_SYRINGE | Freq: Once | INTRAMUSCULAR | Status: AC
  Administered 2024-01-13: 12.5 ug via INTRAVENOUS
  Filled 2024-01-13: qty 1

## 2024-01-13 MED ORDER — ACETAMINOPHEN 325 MG PO TABS: 650.0000 mg | ORAL_TABLET | ORAL | Status: DC

## 2024-01-13 MED ORDER — HYDROMORPHONE HCL 1 MG/ML IJ SOLN
0.5000 mg | INTRAMUSCULAR | Status: DC | PRN
  Administered 2024-01-13 – 2024-01-16 (×3): 0.5 mg via INTRAVENOUS
  Filled 2024-01-13 (×2): qty 1

## 2024-01-13 MED ORDER — SENNA 8.6 MG PO TABS
1.0000 | ORAL_TABLET | Freq: Every day | ORAL | Status: DC
  Filled 2024-01-13: qty 1

## 2024-01-13 MED ORDER — POTASSIUM CHLORIDE 20 MEQ PO PACK
40.0000 meq | PACK | Freq: Once | ORAL | Status: AC
  Administered 2024-01-13: 40 meq
  Filled 2024-01-13: qty 2

## 2024-01-13 MED ORDER — OXYCODONE HCL 5 MG PO TABS
10.0000 mg | ORAL_TABLET | Freq: Four times a day (QID) | ORAL | Status: DC
  Administered 2024-01-13 (×2): 10 mg via ORAL
  Filled 2024-01-13 (×2): qty 2

## 2024-01-13 MED ORDER — SUCRALFATE 1 GM/10ML PO SUSP
1.0000 g | Freq: Three times a day (TID) | ORAL | Status: DC
  Administered 2024-01-13 (×2): 1 g via ORAL
  Filled 2024-01-13 (×2): qty 10

## 2024-01-13 MED ORDER — AMLODIPINE BESYLATE 10 MG PO TABS: 10.0000 mg | ORAL_TABLET | Freq: Every day | ORAL | Status: DC

## 2024-01-13 MED ORDER — ORAL CARE MOUTH RINSE
15.0000 mL | OROMUCOSAL | Status: DC | PRN
Start: 2024-01-13 — End: 2024-01-15

## 2024-01-13 MED ORDER — ALUM & MAG HYDROXIDE-SIMETH 200-200-20 MG/5ML PO SUSP
30.0000 mL | ORAL | Status: DC | PRN
  Administered 2024-01-13: 30 mL via ORAL
  Filled 2024-01-13: qty 30

## 2024-01-13 MED ORDER — PREDNISOLONE ACETATE 1 % OP SUSP
1.0000 [drp] | Freq: Every day | OPHTHALMIC | Status: DC
  Administered 2024-01-13 – 2024-01-20 (×8): 1 [drp] via OPHTHALMIC
  Filled 2024-01-13: qty 5

## 2024-01-13 MED ORDER — LABETALOL HCL 5 MG/ML IV SOLN
10.0000 mg | INTRAVENOUS | Status: DC | PRN
  Administered 2024-01-13: 10 mg via INTRAVENOUS
  Filled 2024-01-13: qty 4

## 2024-01-13 MED ORDER — PHENYTOIN SODIUM EXTENDED 100 MG PO CAPS
100.0000 mg | ORAL_CAPSULE | Freq: Two times a day (BID) | ORAL | Status: DC
  Administered 2024-01-13: 100 mg via ORAL
  Filled 2024-01-13: qty 1

## 2024-01-13 MED ORDER — PREGABALIN 25 MG PO CAPS: 50.0000 mg | ORAL_CAPSULE | Freq: Three times a day (TID) | ORAL | Status: DC

## 2024-01-13 MED ORDER — ENSURE PLUS HIGH PROTEIN PO LIQD
237.0000 mL | Freq: Two times a day (BID) | ORAL | Status: DC
Start: 2024-01-13 — End: 2024-01-14
  Administered 2024-01-13: 237 mL via ORAL

## 2024-01-13 MED ORDER — DULOXETINE HCL 30 MG PO CPEP
60.0000 mg | ORAL_CAPSULE | Freq: Two times a day (BID) | ORAL | Status: DC
  Administered 2024-01-13 (×2): 60 mg via ORAL
  Filled 2024-01-13 (×2): qty 2

## 2024-01-13 MED ORDER — ACETAMINOPHEN 325 MG PO TABS
650.0000 mg | ORAL_TABLET | Freq: Four times a day (QID) | ORAL | Status: AC
  Administered 2024-01-13 (×3): 650 mg via ORAL
  Filled 2024-01-13 (×3): qty 2

## 2024-01-13 NOTE — Progress Notes (Signed)
 NAME:  Renee Gutierrez, MRN:  [id number], DOB:  03-26-1960, LOS: 3 ADMISSION DATE:  01/10/2024, CONSULTATION DATE:  @TODAY @ REFERRING MD:  [facility] ED, CHIEF COMPLAINT:  acute hypoxic respiratory failure   History of Present Illness:  [name] is a 63 y/o F with a PMH significant for HTN, fibromyalgia, GERD, emphysema who presented to [facility] ED for severe chest pain, somnolence, and hypoxia found to have RLL pneumonia, R secondary spontaneous pneumothorax, and concern for opiate toxidrome with ED course c/b worsening encephalopathy requiring invasive mechanical ventilation transferred to [facility] for CCM evaluation.  Per ED notes, the patient presented with somnolence, hypoxia, hypotension. Initial concern for opiate toxidrome. O2 SATS 69%. She was given small dose of narcan  with decent response, more alert and breathing, O2 sats improved. Patient in more pain. CTA chest performed and showed RLL consolidation, R pleural effusion, and pneumothorax. A R pigtail chest tube was placed. The patient initially required NE and request was made to transfer patient to ICU at [facility] or [facility]. She was also placed on a narcan  drip for opiate toxidrome.  On 11/22 AM, ED provider switched and patient had a respiratory decompensation, increasingly agitated, pulling on O2 and Ivs, more hypoxic. Decision was made to intubate patient and a femoral central line was placed. The patient was slated to come to [facility] for ICU level care.  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 and presence of a nonbleeding gastric ulcer measuring 2 cm and with a nonbleeding visible vessel which was ablated with bipolar probe  11/21 --> presented to [facility] ED, opiate toxidrome, narcan  drip, CTA chest showed R pneumo, chest tube placed 11/21 --> more altered, agitated, hypoxia, intubated, CVC placed, transferred to [facility] 11/23 lytics x 1 11/24 lytics x 2  Interim History / Subjective:   Remains critically ill, intubated Off pressors  Afebrile Sedated on propofol , agitated on  W UA yesterday Chest tube output 225 cc last 24 hours  Lines/Tubes: R pigtail CT, ETT, OGT, R Fem CVC, PIV, Foley  Objective    Blood pressure (!) 185/77, pulse 75, temperature 98.4 F (36.9 C), resp. rate 15, height 4' 11 (1.499 m), weight 46.9 kg, SpO2 99%.    Vent Mode: PSV;CPAP FiO2 (%):  [40 %] 40 % Set Rate:  [20 bmp] 20 bmp Vt Set:  [350 mL] 350 mL PEEP:  [5 cmH20] 5 cmH20 Pressure Support:  [5 cmH20] 5 cmH20 Plateau Pressure:  [12 cmH20-16  cmH20] 15 cmH20   Intake/Output Summary (Last 24 hours) at 01/13/2024 0820 Last data filed at 01/13/2024 0701 Gross per 24 hour  Intake 1072.85 ml  Output 1100 ml  Net -27.15 ml   Filed Weights   01/11/24 0717 01/12/24 0500 01/13/24 0355  Weight: 49.2 kg 48.6 kg 46.9 kg    Examination: General: chronically ill appearing, sedated on propofol  Eyes: hazy left pupil opacity, anisocoria, right 2 mm reactive to light ENT: intubated Skin: warm, intact, no rashes Neck: JVD flat CV: RRR, no MRG, nl  S1 and S2, no peripheral edema Resp: Bilateral ventilated breath sounds, chest tube draining minimal fluid Abdom: Normoactive bowel sounds, soft, nontender, nondistended, no hepatosplenomegaly Neuro: RASS -2  Labs show mild hypokalemia, sodium improved improving renal function 41/1.0 , decreased leukocytosis Chest x-ray 11/24 shows decreased right effusion with minimal opacity  Resolved problem list   #High anion gap metabolic acidosis    Assessment and Plan   Cardiovascular:  # Septic shock:  due to favor aspiration vs CAP and empyema #Community Acquired Pneumonia #Empyema -strep mitis and Candida glabrata - Off pressors - Continue Ceftriaxone   - Continue Micafungin  - dc Steroids     #Acute Hypoxic Respiratory Failure: likely due to CAP, empyema, R pneumothorax.  - Low tidal volume ventilation - Lowest FiO2 to keep SPO2 > 90% and PaO2 > 65 mmHg - Daily SAT/SBT with goal extubation - Triple therapy nebs - Hypertonic saline Q 8 H - Chest PT   #Secondary Spontaneous Pneumothorax: Empyema status post lytics x2  - Daily CXR - CT to - 20 suction - No more lytics required, chest x-ray shows clearing will not remove pigatil until output < 150    # Mild protein calorie nutrition: Start tube feeds if not extubated today Intractable nausea and vomiting with recurrent hospital admissions -seen by GI  11/18and plan was to follow-up with general surgery/hernia specialist as outpatient at  [facility] for paraesophageal hernia Last EGD 11/17 showed Nissen from duplication wrap intact, 3 cm paraesophageal hernia, nonbleeding gastric ulcer with clean base  -Consider surgical input after acute issues resolved - PPI  #AKI: likely pre-renal due to shock state; improving with IVF resuscitation. - Renally adjust medications - Avoid Nephrotoxins - Strict I/Os - Foley catheter can be DC'd post extubation - Replace hypokalemia    #VTE ppx: Heparin  subQ TID  #Acute Metabolic  Encephalopathy: likely due to septic shock, uremia etc... CT head negative for acute pathology Chronic opiate use, some concern for withdrawal  - Propofol  drip - Oxycodone  5 every 6, use fentanyl  as needed  Disposition: ICU appropriate for vent support and vasopressors  Labs   CBC: Recent Labs  Lab 01/10/24 0450 01/10/24 1333 01/11/24 0303 01/13/24 0530  WBC 13.7* 4.3 11.1* 10.2  NEUTROABS  --  3.3  --  8.7*  HGB 10.6* 10.9* 9.6* 7.9*  HCT 34.5* 34.2* 30.7* 25.2*  MCV 88.0 84.0 83.9 83.2  PLT 442* 342 262 196    Basic Metabolic Panel: Recent Labs  Lab 01/10/24 0939 01/10/24 1333 01/11/24 0303 01/11/24 1744 01/12/24 0540 01/13/24 0530  NA  --  131* 132* 136 134* 138  K  --  3.9 4.1 3.7 3.7 3.5  CL  --  97* 99 102 101 106  CO2  --  19* 18* 20* 20* 23  GLUCOSE  --  122* 127* 124* 111* 140*  BUN  --  44* 46* 45* 43* 41*  CREATININE  --  3.40* 2.61* 1.74*  1.35* 1.02*  CALCIUM   --  8.3* 8.8* 9.1 9.2 9.3  MG 2.2  --  2.0 2.0  --  2.3  PHOS 8.8*  --  7.6* 6.0*  --  3.8   GFR: Estimated Creatinine Clearance: 38 mL/min (A) (by C-G formula based on SCr of 1.02 mg/dL (H)). Recent Labs  Lab 01/10/24 0450 01/10/24 0630 01/10/24 0843 01/10/24 0940 01/10/24 1333 01/11/24 0303 01/13/24 0530  WBC 13.7*  --   --   --  4.3 11.1* 10.2  LATICACIDVEN  --  1.6 0.9 2.2*  --   --   --     Liver Function Tests: Recent Labs  Lab 01/10/24 0450 01/10/24 1333 01/11/24 0303  AST 172* 155* 101*  ALT 43 43 37  ALKPHOS 64 56 54  BILITOT 0.3 0.3 0.3  PROT 5.8* 5.1* 5.2*  ALBUMIN  3.3* 2.6* 2.5*   Recent Labs  Lab 01/10/24 0450  LIPASE 13   No results for input(s): AMMONIA in the last 168 hours.  ABG    Component Value Date/Time   PHART 7.28 (L) 01/11/2024 0857   PCO2ART 44 01/11/2024 0857   PO2ART 75 (L) 01/11/2024 0857   HCO3 22.8 01/11/2024 1102   TCO2 21 (L) 01/03/2024 1336   ACIDBASEDEF 5.3 (H) 01/11/2024 1102   O2SAT 42.7 01/11/2024 1102     Coagulation  Profile: Recent Labs  Lab 01/10/24 0939  INR 0.9    Cardiac Enzymes: No results for input(s): CKTOTAL, CKMB, CKMBINDEX, TROPONINI in the last 168 hours.  HbA1C: No results found for: HGBA1C  CBG: Recent Labs  Lab 01/12/24 1605 01/12/24 1956 01/12/24 2327 01/13/24 0335 01/13/24 0735  GLUCAP 118* 126* 111* 125* 146*     My independent critical care time was 35 minutes  Harden Staff MD. FCCP. [facility] Pager : [phone]  If no response to pager , please call [phone] until 7 pm After 7:00 pm call Elink  [phone]   01/13/2024

## 2024-01-13 NOTE — Plan of Care (Signed)
  Problem: Clinical Measurements: Goal: Cardiovascular complication will be avoided Outcome: Progressing   Problem: Nutrition: Goal: Adequate nutrition will be maintained Outcome: Progressing   Problem: Pain Managment: Goal: General experience of comfort will improve and/or be controlled Outcome: Progressing   Problem: Safety: Goal: Ability to remain free from injury will improve Outcome: Progressing

## 2024-01-13 NOTE — Progress Notes (Signed)
 Daughter and family updated at bedside. Patient and family states would like to continue full code for now since she is doing well but patient stated that she would not want to be trached.  Having pain despite oxy. One time fentanyl  push ordered. Scheduled tylenol .  RODGER Cedar, PA-C [facility] 01/13/2024, 12:27 PM  Please see [email or web address] for pager details.  From 7A-7P if no response, please call [phone]. After hours, please call ELink [phone].

## 2024-01-13 NOTE — Progress Notes (Signed)
 CPT held at this time due to patient being in pain. RN adjusting meds to help patient with comfort.

## 2024-01-13 NOTE — Procedures (Signed)
 Extubation Procedure Note  Patient Details:   Name: Renee Gutierrez DOB: [birth year] MRN: [id number]   Airway Documentation:    Vent end date: 01/13/24 Vent end time: 0926   Evaluation  O2 sats: stable throughout Complications: No apparent complications Patient did tolerate procedure well. Bilateral Breath Sounds: Clear, Diminished   Yes  Pt tolerated wean. MD ordered to extubate, positive for cuff leak. Pt extubated to 4L [place], no signs of dyspnea or stridor, pt resting comfortably. RN at bedside.   Destynie Toomey S Dezarai Prew 01/13/2024, 9:30 AM

## 2024-01-13 NOTE — Progress Notes (Addendum)
 eLink Physician-Brief Progress Note Patient Name: Renee Gutierrez DOB: [birth year] MRN: [id number]   Date of Service  01/13/2024  HPI/Events of Note  PT complaining of persistent abdominal pain. Has scheduled oxycodone , tylenol  and dilaudid , which have not been effective.  Also on protonix  daily.   eICU Interventions  Will give trial of maalox first and see if it helps relieve her pain.  Hesitant to go up on her pain medications as she had developed respiratory failure from opiate toxidrome (extubated earlier today)        Krysteena Stalker M DELA CRUZ 01/13/2024, 9:39 PM  10:53 PM PT still complaining of 10/10 abdominal pain.  Pt also reported to have poor inspiratory effort given ongoing abdominal pain. Noted brief periods of SpO2 dipping to 80s on HFNC.  Will give trial of Hydrocodone  1mg  IV and see if this is effective in controlling her pain.   Will continue to monitor VS closely.   11:49 PM Pt had responded to dilaudid , appears more comfortable.  However, O2 requirements have increased, with SPO2 mid 80s on NRB.  Ordered for CXR and ABG now.  Will place on trial of HHFNC and see if this helps with SpO2.   12:29 AM SpO2 remains low, even with FiO2 100%, Flow 60LPM on HHFNC, SpO2 83% at best.  CXR resulted in new LLL infiltrate, R opacity appears more dense. (+) new R pneumothorax Chest tube still in place, reported to be draining serosanguinous fluid.  Spoke with family, there were agreeable with reintubation.  Called ED physician to reintubate the pt.  Placed initial ventilator, sedation orders, bilateral wrist restraints.   2:05 AM Post intubation, pt with improved SpO2, up to 93%, CXR shows improved PTx ~1 hour after this, pt with sudden desaturation to the 70s.  On vent, pt with a persistent 50-186ml air leak - decreased TV to Chest tube on suction, no air leak reported from tube.  Around this time, BP came down, with SBP in 70s.   Dr. Layman came to the bedside,  removed a fibrinous clot from the chest tube, after which SpO2 started to improve.  Repeat CXR showed persistent R pneumothorax, but does not appear to be increasing in size. PEEP increased to 8.  Will continue to monitor closely. Follow serial ABG.   Propofol  d/c'd due to hypotension.  Started on levophed  drip to maintain MAP >65

## 2024-01-13 NOTE — Progress Notes (Incomplete)
 eLink Physician-Brief Progress Note Patient Name: Renee Gutierrez DOB: 03-06-1960 MRN: [id number]   Date of Service  01/13/2024  HPI/Events of Note  PT complaining of persistent abdominal pain. Has scheduled oxycodone , tylenol  and dilaudid , which have not been effective.  Also on protonix  daily.   eICU Interventions  Will give trial of maalox first and see if it helps relieve her pain.  Hesitant to go up on her pain medications as she had developed respiratory failure from opiate toxidrome (extubated earlier today)        Siobahn Worsley M DELA CRUZ 01/13/2024, 9:39 PM  10:53 PM PT still complaining of 10/10 abdominal pain.  Pt also reported to have poor inspiratory effort given ongoing abdominal pain. Noted brief periods of SpO2 dipping to 80s on HFNC.  Will give trial of Hydrocodone  1mg  IV and see if this is effective in controlling her pain.   Will continue to monitor VS closely.   11:49 PM Pt had responded to dilaudid , appears more comfortable.  However, O2 requirements have increased, with SPO2 mid 80s on NRB.  Ordered for CXR and ABG now.  Will place on trial of HHFNC and see if this helps with SpO2.

## 2024-01-13 NOTE — Evaluation (Signed)
 Physical Therapy Evaluation Patient Details Name: Renee Gutierrez MRN: [id number] DOB: September 20, 1960 Today's Date: 01/13/2024  History of Present Illness  Pt is a 63 y/o F admitted to [facility] on 01/09/24 after presenting with c/o severe chest pain, somnolence, & hypoxia. Pt found to have RLL PNA, R secondary spontaneous pneumothorax, & concern for opiate toxidrome. ED course complicated by worsening encephalopathy requiring mechanical ventilation. Pt had R chest tube placed & transferred to [facility] long on 01/09/24. Pt extubated 01/13/24. 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 Pt is pleasant throughout session, willing to mobilize. Prior to admission pt was ambulatory with PRN use of rollator, 2 falls in the past 6 months. Pt is blind in L eye. On this date, pt performs BUE AAROM/AROM, encouraged elevation & AROM to help with edema management. Pt is able to transition to sitting EOB with mod assist, static sitting ~2 minutes with CGA. Pt noted fatigue & requested to return to bed. Positioned in chair position, nurse in room. Recommend ongoing PT services to progress mobility as able.      If plan is discharge home, recommend the following: Two people to help with walking and/or transfers;Two people to help with bathing/dressing/bathroom;Assistance with cooking/housework;Assistance with feeding;Assist for transportation   Can travel by private vehicle   No    Equipment Recommendations Other (comment) (TBD)  Recommendations for Other Services  Rehab consult    Functional Status Assessment Patient has had a recent decline in their functional status and demonstrates the ability to make significant improvements in function in a reasonable and predictable  amount of time.     Precautions / Restrictions Precautions Precautions: Fall Precaution/Restrictions Comments: NG tube, R chest tube Restrictions Weight Bearing Restrictions Per Provider Order: No      Mobility  Bed Mobility Overal bed mobility: Needs Assistance Bed Mobility: Supine to Sit, Sit to Supine     Supine to sit: Mod assist, Used rails, HOB elevated Sit to supine: Mod assist, HOB elevated, Used rails   General bed mobility comments: cuing re: technique, use of HOB elevated to assist, exit R side of bed, able to hold to PT's hand & assist with uprighting trunk    Transfers                        Ambulation/Gait                  Stairs            Wheelchair Mobility     Tilt Bed    Modified Rankin (Stroke Patients Only)       Balance Overall balance assessment: Needs assistance Sitting-balance support: Feet unsupported, Bilateral upper extremity supported, Single extremity supported Sitting balance-Leahy Scale: Fair Sitting balance - Comments: CGA static sitting EOB                                     Pertinent Vitals/Pain Pain Assessment Pain Assessment: Faces Faces Pain Scale: Hurts even more Pain Location: R side of chest/breast Pain Descriptors / Indicators: Discomfort, Grimacing Pain Intervention(s): Monitored during session, Limited activity within patient's tolerance    Home Living Family/patient expects to be discharged to:: Private  residence Living Arrangements: Spouse/significant other Available Help at Discharge: Family Type of Home: House Home Access: Stairs to enter Entrance Stairs-Rails: Right Entrance Stairs-Number of Steps: 2   Home Layout: One level Home Equipment: Rollator (4 wheels);BSC/3in1;Tub bench      Prior Function               Mobility Comments: ambulatory with PRN use of rollator, 2 falls in the past 6 months, ADLs Comments: independent     Extremity/Trunk  Assessment   Upper Extremity Assessment Upper Extremity Assessment: Generalized weakness (BUE edema (L>R))    Lower Extremity Assessment Lower Extremity Assessment: Generalized weakness (2/5 knee extension in sitting)       Communication   Communication Communication: No apparent difficulties Factors Affecting Communication: Reduced clarity of speech (top lip swollen, assume 2/2 intubation)    Cognition Arousal: Alert Behavior During Therapy: Flat affect   PT - Cognitive impairments:  (need to assess further)                         Following commands: Impaired Following commands impaired: Follows one step commands with increased time     Cueing Cueing Techniques: Verbal cues     General Comments      Exercises General Exercises - Upper Extremity Shoulder Flexion: AAROM, Strengthening, 5 reps, Both, Supine General Exercises - Lower Extremity Long Arc Quad: AROM, Seated, Strengthening, Both, 5 reps   Assessment/Plan    PT Assessment Patient needs continued PT services  PT Problem List Cardiopulmonary status limiting activity;Decreased strength;Decreased range of motion;Decreased activity tolerance;Decreased coordination;Decreased cognition;Pain;Decreased balance;Decreased mobility;Decreased knowledge of precautions;Decreased knowledge of use of DME;Decreased safety awareness       PT Treatment Interventions DME instruction;Balance training;Gait training;Neuromuscular re-education;Stair training;Cognitive remediation;Functional mobility training;Patient/family education;Therapeutic exercise;Manual techniques;Therapeutic activities;Wheelchair mobility training    PT Goals (Current goals can be found in the Care Plan section)  Acute Rehab PT Goals Patient Stated Goal: get better PT Goal Formulation: With patient Time For Goal Achievement: 01/27/24 Potential to Achieve Goals: Good    Frequency Min 2X/week     Co-evaluation               AM-PAC PT  6 Clicks Mobility  Outcome Measure Help needed turning from your back to your side while in a flat bed without using bedrails?: A Little Help needed moving from lying on your back to sitting on the side of a flat bed without using bedrails?: Total Help needed moving to and from a bed to a chair (including a wheelchair)?: Total Help needed standing up from a chair using your arms (e.g., wheelchair or bedside chair)?: Total Help needed to walk in hospital room?: Total Help needed climbing 3-5 steps with a railing? : Total 6 Click Score: 8    End of Session Equipment Utilized During Treatment: Oxygen Activity Tolerance: Patient tolerated treatment well;Patient limited by fatigue Patient left: in bed;with call bell/phone within reach;with bed alarm set;with family/visitor present;with nursing/sitter in room (in bed in chair position) Nurse Communication: Mobility status PT Visit Diagnosis: Difficulty in walking, not elsewhere classified (R26.2);Muscle weakness (generalized) (M62.81);Other abnormalities of gait and mobility (R26.89);Unsteadiness on feet (R26.81)    Time: 8549-8490 PT Time Calculation (min) (ACUTE ONLY): 19 min   Charges:   PT Evaluation $PT Eval Moderate Complexity: 1 Mod $PT Eval High Complexity: 1 High   PT General Charges $$ ACUTE PT VISIT: 1 Visit       Richerd Pinal, PT, DPT  01/13/24, 3:26 PM   Richerd CHRISTELLA Pinal 01/13/2024, 3:23 PM

## 2024-01-14 ENCOUNTER — Inpatient Hospital Stay (HOSPITAL_COMMUNITY)

## 2024-01-14 ENCOUNTER — Other Ambulatory Visit: Payer: Self-pay

## 2024-01-14 ENCOUNTER — Other Ambulatory Visit (HOSPITAL_COMMUNITY)

## 2024-01-14 DIAGNOSIS — R6521 Severe sepsis with septic shock: Secondary | ICD-10-CM | POA: Diagnosis not present

## 2024-01-14 DIAGNOSIS — A419 Sepsis, unspecified organism: Secondary | ICD-10-CM | POA: Diagnosis not present

## 2024-01-14 DIAGNOSIS — E441 Mild protein-calorie malnutrition: Secondary | ICD-10-CM | POA: Diagnosis not present

## 2024-01-14 DIAGNOSIS — J9601 Acute respiratory failure with hypoxia: Secondary | ICD-10-CM | POA: Diagnosis not present

## 2024-01-14 LAB — CBC WITH DIFFERENTIAL/PLATELET
Abs Immature Granulocytes: 0.69 K/uL — ABNORMAL HIGH (ref 0.00–0.07)
Basophils Absolute: 0.1 K/uL (ref 0.0–0.1)
Basophils Relative: 1 %
Eosinophils Absolute: 0 K/uL (ref 0.0–0.5)
Eosinophils Relative: 0 %
HCT: 34 % — ABNORMAL LOW (ref 36.0–46.0)
Hemoglobin: 10.6 g/dL — ABNORMAL LOW (ref 12.0–15.0)
Immature Granulocytes: 4 %
Lymphocytes Relative: 5 %
Lymphs Abs: 1 K/uL (ref 0.7–4.0)
MCH: 26.4 pg (ref 26.0–34.0)
MCHC: 31.2 g/dL (ref 30.0–36.0)
MCV: 84.8 fL (ref 80.0–100.0)
Monocytes Absolute: 0.5 K/uL (ref 0.1–1.0)
Monocytes Relative: 2 %
Neutro Abs: 17.3 K/uL — ABNORMAL HIGH (ref 1.7–7.7)
Neutrophils Relative %: 88 %
Platelets: 242 K/uL (ref 150–400)
RBC: 4.01 MIL/uL (ref 3.87–5.11)
RDW: 17 % — ABNORMAL HIGH (ref 11.5–15.5)
Smear Review: NORMAL
WBC: 19.6 K/uL — ABNORMAL HIGH (ref 4.0–10.5)
nRBC: 0.5 % — ABNORMAL HIGH (ref 0.0–0.2)

## 2024-01-14 LAB — BLOOD GAS, ARTERIAL
Acid-base deficit: 0.5 mmol/L (ref 0.0–2.0)
Acid-base deficit: 2.2 mmol/L — ABNORMAL HIGH (ref 0.0–2.0)
Acid-base deficit: 4.2 mmol/L — ABNORMAL HIGH (ref 0.0–2.0)
Acid-base deficit: 5.1 mmol/L — ABNORMAL HIGH (ref 0.0–2.0)
Bicarbonate: 22 mmol/L (ref 20.0–28.0)
Bicarbonate: 23.1 mmol/L (ref 20.0–28.0)
Bicarbonate: 23.2 mmol/L (ref 20.0–28.0)
Bicarbonate: 23.7 mmol/L (ref 20.0–28.0)
Drawn by: 331471
Drawn by: 74501
Drawn by: 74501
Drawn by: 74501
FIO2: 100 %
FIO2: 100 %
FIO2: 100 %
FIO2: 95 %
MECHVT: 340 mL
MECHVT: 300 mL
MECHVT: 300 mL
O2 Saturation: 81.6 %
O2 Saturation: 85.3 %
O2 Saturation: 94 %
O2 Saturation: 98 %
PEEP: 5 cmH2O
PEEP: 8 cmH2O
PEEP: 8 cmH2O
Patient temperature: 36.4
Patient temperature: 37.3
Patient temperature: 37.4
Patient temperature: 38.5
RATE: 20 {breaths}/min
RATE: 18 {breaths}/min
RATE: 18 {breaths}/min
pCO2 arterial: 35 mmHg (ref 32–48)
pCO2 arterial: 40 mmHg (ref 32–48)
pCO2 arterial: 51 mmHg — ABNORMAL HIGH (ref 32–48)
pCO2 arterial: 55 mmHg — ABNORMAL HIGH (ref 32–48)
pH, Arterial: 7.25 — ABNORMAL LOW (ref 7.35–7.45)
pH, Arterial: 7.25 — ABNORMAL LOW (ref 7.35–7.45)
pH, Arterial: 7.37 (ref 7.35–7.45)
pH, Arterial: 7.43 (ref 7.35–7.45)
pO2, Arterial: 53 mmHg — ABNORMAL LOW (ref 83–108)
pO2, Arterial: 56 mmHg — ABNORMAL LOW (ref 83–108)
pO2, Arterial: 78 mmHg — ABNORMAL LOW (ref 83–108)
pO2, Arterial: 79 mmHg — ABNORMAL LOW (ref 83–108)

## 2024-01-14 LAB — BASIC METABOLIC PANEL WITH GFR
Anion gap: 8 (ref 5–15)
Anion gap: 8 (ref 5–15)
BUN: 36 mg/dL — ABNORMAL HIGH (ref 8–23)
BUN: 39 mg/dL — ABNORMAL HIGH (ref 8–23)
CO2: 23 mmol/L (ref 22–32)
CO2: 23 mmol/L (ref 22–32)
Calcium: 9.7 mg/dL (ref 8.9–10.3)
Calcium: 9.6 mg/dL (ref 8.9–10.3)
Chloride: 111 mmol/L (ref 98–111)
Chloride: 110 mmol/L (ref 98–111)
Creatinine, Ser: 0.79 mg/dL (ref 0.44–1.00)
Creatinine, Ser: 0.92 mg/dL (ref 0.44–1.00)
GFR, Estimated: >60 mL/min (ref >60)
GFR, Estimated: >60 mL/min (ref >60)
Glucose, Bld: 112 mg/dL — ABNORMAL HIGH (ref 70–99)
Glucose, Bld: 152 mg/dL — ABNORMAL HIGH (ref 70–99)
Potassium: 4 mmol/L (ref 3.5–5.1)
Potassium: 4.7 mmol/L (ref 3.5–5.1)
Sodium: 142 mmol/L (ref 135–145)
Sodium: 142 mmol/L (ref 135–145)

## 2024-01-14 LAB — GLUCOSE, CAPILLARY
Glucose-Capillary: 145 mg/dL — ABNORMAL HIGH (ref 70–99)
Glucose-Capillary: 132 mg/dL — ABNORMAL HIGH (ref 70–99)
Glucose-Capillary: 100 mg/dL — ABNORMAL HIGH (ref 70–99)
Glucose-Capillary: 102 mg/dL — ABNORMAL HIGH (ref 70–99)
Glucose-Capillary: 106 mg/dL — ABNORMAL HIGH (ref 70–99)
Glucose-Capillary: 92 mg/dL (ref 70–99)
Glucose-Capillary: 100 mg/dL — ABNORMAL HIGH (ref 70–99)

## 2024-01-14 LAB — MAGNESIUM: Magnesium: 2.5 mg/dL — ABNORMAL HIGH (ref 1.7–2.4)

## 2024-01-14 LAB — CYTOLOGY - NON PAP

## 2024-01-14 LAB — PHOSPHORUS: Phosphorus: 2.6 mg/dL (ref 2.5–4.6)

## 2024-01-14 LAB — CG4 I-STAT (LACTIC ACID)
Lactic Acid, Venous: 1.3 mmol/L (ref 0.5–1.9)
Lactic Acid, Venous: 1.6 mmol/L (ref 0.5–1.9)

## 2024-01-14 MED ORDER — NOREPINEPHRINE 4 MG/250ML-% IV SOLN
INTRAVENOUS | Status: AC
  Filled 2024-01-14: qty 250

## 2024-01-14 MED ORDER — ORAL CARE MOUTH RINSE: 15.0000 mL | OROMUCOSAL | Status: DC | PRN

## 2024-01-14 MED ORDER — ACETAMINOPHEN 325 MG PO TABS: 650.0000 mg | ORAL_TABLET | Freq: Four times a day (QID) | ORAL | Status: DC | PRN

## 2024-01-14 MED ORDER — PROPOFOL 1000 MG/100ML IV EMUL
0.0000 ug/kg/min | INTRAVENOUS | Status: DC
  Administered 2024-01-14: 20 ug/kg/min via INTRAVENOUS
  Filled 2024-01-14: qty 100

## 2024-01-14 MED ORDER — HYDROMORPHONE HCL 1 MG/ML IJ SOLN
1.0000 mg | Freq: Once | INTRAMUSCULAR | Status: AC
  Filled 2024-01-14: qty 1

## 2024-01-14 MED ORDER — PHENYTOIN 125 MG/5ML PO SUSP
100.0000 mg | Freq: Two times a day (BID) | ORAL | Status: DC
  Administered 2024-01-14 – 2024-01-15 (×4): 100 mg
  Filled 2024-01-14 (×5): qty 4

## 2024-01-14 MED ORDER — HYDROMORPHONE BOLUS VIA INFUSION
0.2500 mg | INTRAVENOUS | Status: DC | PRN
  Administered 2024-01-15: 0.5 mg via INTRAVENOUS
  Administered 2024-01-15: 0.75 mg via INTRAVENOUS
  Administered 2024-01-15: 0.5 mg via INTRAVENOUS
  Administered 2024-01-16: 1 mg via INTRAVENOUS
  Administered 2024-01-16: 0.5 mg via INTRAVENOUS
  Administered 2024-01-17: 1 mg via INTRAVENOUS
  Administered 2024-01-17: 2 mg via INTRAVENOUS
  Administered 2024-01-17 (×2): 1 mg via INTRAVENOUS
  Administered 2024-01-17: 2 mg via INTRAVENOUS
  Administered 2024-01-17 (×3): 1 mg via INTRAVENOUS
  Administered 2024-01-17: 2 mg via INTRAVENOUS
  Administered 2024-01-17: 0.25 mg via INTRAVENOUS
  Administered 2024-01-17 (×3): 1 mg via INTRAVENOUS
  Administered 2024-01-18 (×2): 2 mg via INTRAVENOUS
  Administered 2024-01-18: 1 mg via INTRAVENOUS
  Administered 2024-01-18 – 2024-01-19 (×8): 2 mg via INTRAVENOUS
  Administered 2024-01-19 (×3): 1.5 mg via INTRAVENOUS
  Administered 2024-01-19 (×2): 2 mg via INTRAVENOUS
  Administered 2024-01-19: 1.25 mg via INTRAVENOUS
  Administered 2024-01-19: 1.5 mg via INTRAVENOUS
  Administered 2024-01-19: 2 mg via INTRAVENOUS
  Administered 2024-01-19: 1.25 mg via INTRAVENOUS
  Administered 2024-01-20: 1.75 mg via INTRAVENOUS
  Administered 2024-01-20 (×2): 1.5 mg via INTRAVENOUS
  Administered 2024-01-21 (×2): 2 mg via INTRAVENOUS

## 2024-01-14 MED ORDER — FENTANYL CITRATE (PF) 50 MCG/ML IJ SOSY: 50.0000 ug | PREFILLED_SYRINGE | INTRAMUSCULAR | Status: DC | PRN

## 2024-01-14 MED ORDER — DOCUSATE SODIUM 50 MG/5ML PO LIQD
100.0000 mg | Freq: Two times a day (BID) | ORAL | Status: DC
  Administered 2024-01-15 (×2): 100 mg
  Filled 2024-01-14 (×2): qty 10

## 2024-01-14 MED ORDER — ETOMIDATE 2 MG/ML IV SOLN
20.0000 mg | INTRAVENOUS | Status: AC
  Administered 2024-01-14: 20 mg via INTRAVENOUS

## 2024-01-14 MED ORDER — MIDAZOLAM HCL (PF) 2 MG/2ML IJ SOLN: 1.0000 mg | INTRAMUSCULAR | Status: DC | PRN

## 2024-01-14 MED ORDER — SODIUM CHLORIDE 0.9% FLUSH
10.0000 mL | Freq: Three times a day (TID) | INTRAVENOUS | Status: DC
  Administered 2024-01-14 – 2024-01-20 (×18): 10 mL via INTRAPLEURAL

## 2024-01-14 MED ORDER — NOREPINEPHRINE 4 MG/250ML-% IV SOLN
0.0000 ug/min | INTRAVENOUS | Status: DC
  Administered 2024-01-14: 5 ug/min via INTRAVENOUS
  Administered 2024-01-14: 10 ug/min via INTRAVENOUS
  Administered 2024-01-15: 13 ug/min via INTRAVENOUS
  Administered 2024-01-15: 9 ug/min via INTRAVENOUS
  Filled 2024-01-14 (×3): qty 250

## 2024-01-14 MED ORDER — MIDAZOLAM HCL (PF) 2 MG/2ML IJ SOLN
1.0000 mg | INTRAMUSCULAR | Status: DC | PRN
  Administered 2024-01-14 – 2024-01-17 (×5): 2 mg via INTRAVENOUS
  Administered 2024-01-17: 1 mg via INTRAVENOUS
  Administered 2024-01-17 – 2024-01-18 (×2): 2 mg via INTRAVENOUS
  Filled 2024-01-14 (×9): qty 2

## 2024-01-14 MED ORDER — EPINEPHRINE 1 MG/10ML IV SOSY
PREFILLED_SYRINGE | INTRAVENOUS | Status: AC
  Filled 2024-01-14: qty 10

## 2024-01-14 MED ORDER — OXYCODONE HCL 5 MG PO TABS
10.0000 mg | ORAL_TABLET | Freq: Four times a day (QID) | ORAL | Status: DC
  Administered 2024-01-14 – 2024-01-16 (×5): 10 mg
  Filled 2024-01-14 (×5): qty 2

## 2024-01-14 MED ORDER — PREGABALIN 75 MG PO CAPS
75.0000 mg | ORAL_CAPSULE | Freq: Three times a day (TID) | ORAL | Status: DC
Start: 2024-01-14 — End: 2024-01-16
  Administered 2024-01-14 – 2024-01-15 (×4): 75 mg
  Filled 2024-01-14 (×4): qty 1

## 2024-01-14 MED ORDER — SUCCINYLCHOLINE CHLORIDE 200 MG/10ML IV SOSY
75.0000 mg | PREFILLED_SYRINGE | INTRAVENOUS | Status: AC
  Administered 2024-01-14: 75 mg via INTRAVENOUS

## 2024-01-14 MED ORDER — ORAL CARE MOUTH RINSE
15.0000 mL | OROMUCOSAL | Status: DC
  Administered 2024-01-14 – 2024-01-15 (×17): 15 mL via OROMUCOSAL

## 2024-01-14 MED ORDER — FENTANYL BOLUS VIA INFUSION: 25.0000 ug | INTRAVENOUS | Status: DC | PRN

## 2024-01-14 MED ORDER — ACETAMINOPHEN 650 MG RE SUPP
650.0000 mg | RECTAL | Status: DC | PRN
  Administered 2024-01-14: 650 mg via RECTAL
  Filled 2024-01-14: qty 1

## 2024-01-14 MED ORDER — PROPOFOL 1000 MG/100ML IV EMUL: 0.0000 ug/kg/min | INTRAVENOUS | Status: DC

## 2024-01-14 MED ORDER — SUCRALFATE 1 GM/10ML PO SUSP
1.0000 g | Freq: Three times a day (TID) | ORAL | Status: DC
  Administered 2024-01-14 – 2024-01-15 (×5): 1 g
  Filled 2024-01-14 (×6): qty 10

## 2024-01-14 MED ORDER — SODIUM BICARBONATE 8.4 % IV SOLN
50.0000 meq | Freq: Once | INTRAVENOUS | Status: AC
  Administered 2024-01-14: 50 meq via INTRAVENOUS
  Filled 2024-01-14: qty 50

## 2024-01-14 MED ORDER — SUCCINYLCHOLINE CHLORIDE 200 MG/10ML IV SOSY
PREFILLED_SYRINGE | INTRAVENOUS | Status: AC
  Filled 2024-01-14: qty 10

## 2024-01-14 MED ORDER — HYDROCORTISONE SOD SUC (PF) 100 MG IJ SOLR
100.0000 mg | Freq: Three times a day (TID) | INTRAMUSCULAR | Status: DC
  Administered 2024-01-14: 100 mg via INTRAVENOUS
  Filled 2024-01-14: qty 2

## 2024-01-14 MED ORDER — FENTANYL 2500MCG IN NS 250ML (10MCG/ML) PREMIX INFUSION
0.0000 ug/h | INTRAVENOUS | Status: DC
  Administered 2024-01-14: 25 ug/h via INTRAVENOUS
  Filled 2024-01-14: qty 250

## 2024-01-14 MED ORDER — VITAL HP 1.0 CAL PO LIQD
1000.0000 mL | ORAL | Status: DC
  Administered 2024-01-14: 1000 mL

## 2024-01-14 MED ORDER — STERILE WATER FOR INJECTION IJ SOLN
5.0000 mg | Freq: Once | RESPIRATORY_TRACT | Status: AC
  Administered 2024-01-14: 5 mg via INTRAPLEURAL
  Filled 2024-01-14: qty 5

## 2024-01-14 MED ORDER — SENNA 8.6 MG PO TABS
1.0000 | ORAL_TABLET | Freq: Every day | ORAL | Status: DC
  Administered 2024-01-14 – 2024-01-15 (×2): 8.6 mg
  Filled 2024-01-14 (×2): qty 1

## 2024-01-14 MED ORDER — MIDAZOLAM-SODIUM CHLORIDE 100-0.9 MG/100ML-% IV SOLN
0.5000 mg/h | INTRAVENOUS | Status: DC
  Administered 2024-01-14: 0.5 mg/h via INTRAVENOUS
  Filled 2024-01-14: qty 100

## 2024-01-14 MED ORDER — FENTANYL CITRATE (PF) 50 MCG/ML IJ SOSY
25.0000 ug | PREFILLED_SYRINGE | Freq: Once | INTRAMUSCULAR | Status: AC
  Administered 2024-01-14: 50 ug via INTRAVENOUS
  Filled 2024-01-14: qty 1

## 2024-01-14 MED ORDER — PHENYLEPHRINE 80 MCG/ML (10ML) SYRINGE FOR IV PUSH (FOR BLOOD PRESSURE SUPPORT)
PREFILLED_SYRINGE | INTRAVENOUS | Status: AC
  Filled 2024-01-14: qty 10

## 2024-01-14 MED ORDER — POLYETHYLENE GLYCOL 3350 17 G PO PACK
17.0000 g | PACK | Freq: Every day | ORAL | Status: DC
  Administered 2024-01-14: 17 g
  Filled 2024-01-14: qty 1

## 2024-01-14 MED ORDER — SODIUM CHLORIDE (PF) 0.9 % IJ SOLN
10.0000 mg | Freq: Once | INTRAMUSCULAR | Status: AC
  Administered 2024-01-14: 10 mg via INTRAPLEURAL
  Filled 2024-01-14: qty 10

## 2024-01-14 MED ORDER — POLYETHYLENE GLYCOL 3350 17 G PO PACK
17.0000 g | PACK | Freq: Every day | ORAL | Status: DC
  Administered 2024-01-15: 17 g
  Filled 2024-01-14: qty 1

## 2024-01-14 MED ORDER — HYDROMORPHONE HCL-NACL 50-0.9 MG/50ML-% IV SOLN
0.5000 mg/h | INTRAVENOUS | Status: DC
  Administered 2024-01-14: 1 mg/h via INTRAVENOUS
  Filled 2024-01-14: qty 50

## 2024-01-14 MED ORDER — ETOMIDATE 2 MG/ML IV SOLN
INTRAVENOUS | Status: AC
  Filled 2024-01-14: qty 10

## 2024-01-14 NOTE — Progress Notes (Signed)
 Consent for bedside PICC obtained from Daughter including risks, benefits, and alternatives. Vein assessment under US  reveals no vein measured less than 45% vein occupancy with the closest at 60%.If PICC is still desired, contact IR. Primary RN notified.

## 2024-01-14 NOTE — Progress Notes (Addendum)
 eLink Physician-Brief Progress Note Patient Name: Renee Gutierrez DOB: 1960/11/17 MRN: [id number]   Date of Service  01/14/2024  HPI/Events of Note  Dr Jude hand off to keep close watch.  Camera: On lung protective ventilation, PEEP at 8, PIP 10. In synchrony, RR 24. On levophed .    eICU Interventions  Stable type 2 respiratory failure from emphysema, right sided effusion, aspiration pneumonia with metabolic acidosis, septic shock, empyema, pneumothorax, pig tail. No air leak.  -continue current care plan.       Intervention Category Intermediate Interventions: Other:  [name] 01/14/2024, 9:34 PM  20:10 Critical Lab Value:   Aerobic blood cultures are positive for yeast. Discussed with RN. Already on Micafungin .   23:04 I see that earlier they held her lyrica , cymbalta , and sucralfate  per ccm orders. I don't know if I'm supposed to hold her dose for tonight as well?  Discussed with RN. - continue to hold for now, AM team to re assess on this. On dilaudid  drip, hold oxycodone .  01:17 RpH secure chat:  microlab reports growing candida glabrata in 1/3>ok to start micofungin 100mg  IV q24h ?SABRA Belton to start and  Consider opthalmic opinion for look out for endophthalmitis.

## 2024-01-14 NOTE — Progress Notes (Signed)
 Nutrition Follow-up  DOCUMENTATION CODES:   Non-severe (moderate) malnutrition in context of chronic illness  INTERVENTION:  - Plan to start trickle tube feeds today via NGT: Vital High Protein @ 90mL/hr - holding 1 hour before and 2 hours after each Dilantin  administration.   - Monitor magnesium , potassium, and phosphorus daily for at least 3 days, MD to replete as needed, as pt is at risk for refeeding syndrome given malnutrition.    - If patient remains intubated and plan to advance tube feeds, recommend: Vital 1.5 at 50 ml/h - holding 1 hour before and 2 hours after each Dilantin  administration (total run time of 18 hours) *Would recommend starting at 14mL/hr and advancing by 10mL Q12H Prosource TF20 60 ml daily Provides 1430 kcal, 80 gm protein, 688 ml free water  daily   - FWF per CCM.    - Monitor weight trends.   NUTRITION DIAGNOSIS:   Moderate Malnutrition related to chronic illness as evidenced by moderate fat depletion, severe muscle depletion. *ongoing  GOAL:   Patient will meet greater than or equal to 90% of their needs *not met  MONITOR:   Vent status, Labs, Weight trends, TF tolerance  REASON FOR ASSESSMENT:   Consult Enteral/tube feeding initiation and management (Trickle TF)  ASSESSMENT:   63 y/o F with a PMH significant for HTN, fibromyalgia, GERD, emphysema who presented for severe chest pain, somnolence, and hypoxia found to have RLL pneumonia and right secondary spontaneous pneumothorax. Reportedly has been having recurrent admits for intractable N/V over the past 2 months.  11/22 Admit; Intubated 11/24 Trickle TF initiated 11/25 Extubated, Soft diet 11/26 Re-intubated   Patient is currently intubated on ventilator support MV: 9.5 L/min Temp (24hrs), Avg:98.6 F (37 C), Min:97.5 F (36.4 C), Max:99.3 F (37.4 C)  Patient re-intubated early this AM.  Per CCM, can restart trickle tube feeds today. Left goal TF recs for once able to  advance.   Admit weight: 95# Current weight: 104# I&O's: +2.4L since admit   Medications reviewed and include: Protonix , Dilantin  BID, Carafate , Miralax , Senokot Fentanyl  Levophed  @ 7 mcg/min  Labs reviewed:  -  Diet Order:   Diet Order             DIET SOFT Room service appropriate? Yes; Fluid consistency: Thin  Diet effective now                   EDUCATION NEEDS:  Not appropriate for education at this time  Skin:  Skin Assessment: Reviewed RN Assessment  Last BM:  11/26 - type 6  Height:  Ht Readings from Last 1 Encounters:  01/14/24 4' 11 (1.499 m)   Weight:  Wt Readings from Last 1 Encounters:  01/14/24 47.2 kg    BMI:  Body mass index is 21.02 kg/m.  Estimated Nutritional Needs:  Kcal:  1300-1500 kcals Protein:  65-85 grams Fluid:  >/= 1.5L    Trude Ned RD, LDN Contact via Secure Chat.

## 2024-01-14 NOTE — Progress Notes (Signed)
 Episode of desaturation, improved with recruitment. Remains on 100%, PEEP of 8 ABG shows combined metabolic and respiratory acidosis. Chest x-ray shows small 5% apical pneumothorax with right effusion appears improved and bilateral multifocal airspace disease No acidotic breathing, increased respiratory rate to 24 and tidal volume to 360 1 amp of bicarb given Levophed  was off but now is up to 10 mics Flushed pigtail and ensure that that is draining well, no airleak Son and daughter updated at bedside  Additional critical care time was 20 minutes Ardice Boyan V. Jude MD

## 2024-01-14 NOTE — Progress Notes (Signed)
 NAME:  Renee Gutierrez, MRN:  [id number], DOB:  11/24/1960, LOS: 4 ADMISSION DATE:  01/10/2024, CONSULTATION DATE:  @TODAY @ REFERRING MD:  [facility], CHIEF COMPLAINT:  acute hypoxic respiratory failure   History of Present Illness:  Renee Gutierrez is a 63 y/o F with a PMH significant for HTN, fibromyalgia, GERD, emphysema who presented to [facility] ED for severe chest pain, somnolence, and hypoxia found to have RLL pneumonia, R secondary spontaneous pneumothorax, and concern for opiate toxidrome with ED course c/b worsening encephalopathy requiring invasive mechanical ventilation transferred to [facility] for CCM evaluation.  Per ED notes, the patient presented with somnolence, hypoxia, hypotension. Initial concern for opiate toxidrome. O2 SATS 69%. She was given small dose of narcan  with decent response, more alert and breathing, O2 sats improved. Patient in more pain. CTA chest performed and showed RLL consolidation, R pleural effusion, and pneumothorax. A R pigtail chest tube was placed. The patient initially required NE and request was made to transfer patient to ICU at [facility] or [facility]. She was also placed on a narcan  drip for opiate toxidrome.  On 11/22 AM, ED provider switched and patient had a respiratory decompensation, increasingly agitated, pulling on O2 and Ivs, more hypoxic. Decision was made to intubate patient and a femoral central line was placed. The patient was slated to come to [facility] for ICU level care.  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 and presence of a nonbleeding gastric ulcer measuring 2 cm and with a nonbleeding visible vessel which was ablated with bipolar probe  11/21 --> presented to [facility] ED, opiate toxidrome, narcan  drip, CTA chest showed R pneumo, chest tube placed 11/21 --> more altered, agitated, hypoxia, intubated, CVC placed, transferred to [facility] 11/23 lytics x 1 11/24 lytics x 2 11/25 extubated but reintubated by evening due to pigtail blockage & RT pneumo  Interim History / Subjective:   Events overnight noted, required reintubation, chest x-ray showing right pneumothorax improved after flushing pigtail and removal of fibrinous clot Back on 7 mics Levophed  Afebrile Sedated on fentanyl  and Versed  drips Chest tube output 1.2 L last 24 hours  Lines/Tubes: R pigtail CT, ETT, NGT, R Fem CVC, PIV, Foley  Objective    Blood pressure 139/61, pulse 64, temperature 98.8 F (37.1 C), resp. rate 20, height 4' 11 (1.499 m), weight 47.2 kg, SpO2 92%.    Vent Mode: PRVC FiO2 (%):  [100 %] 100 % Set Rate:  [18 bmp-20 bmp]  18 bmp Vt Set:  [300 mL-340 mL] 300 mL PEEP:  [5 cmH20-8 cmH20] 8 cmH20 Plateau Pressure:  [18 cmH20-23 cmH20] 18 cmH20   Intake/Output Summary (Last 24 hours) at 01/14/2024 0843 Last data filed at 01/14/2024 0654 Gross per 24 hour  Intake 572.71 ml  Output 2115 ml  Net -1542.29 ml   Filed Weights   01/12/24 0500 01/13/24 0355 01/14/24 0500  Weight: 48.6 kg 46.9 kg 47.2 kg    Examination: General: chronically ill appearing,  intubated, sedate Eyes: hazy left pupil opacity, anisocoria, right 2 mm reactive to light ENT: intubated Skin: warm, intact, no rashes Neck: JVD flat CV: RRR, no MRG, nl S1 and S2, no peripheral edema Resp: Bilateral ventilated breath sounds, chest tube draining brownish fluid, no airleak Abdom: soft, nontender, nondistended, no hepatosplenomegaly Neuro: RASS -2 on fentanyl  drip  Labs show increased leukocytosis, normal electrolytes Chest x-ray 11/26 shows improved right pneumothorax, right effusion, expiratory disease bilateral  Resolved problem list   #High anion gap metabolic acidosis    Assessment and Plan    #Acute Hypoxic Respiratory Failure: likely due to CAP, empyema, R pneumothorax. Reintubated 11/25 due to right pneumothorax/pigtail blockage - Low tidal volume ventilation - Lowest FiO2 to keep SPO2 > 90% and PaO2 > 65 mmHg , dropped PEEP back down to 5 - Daily SAT/SBT as tolerated with goal extubation - Triple therapy nebs - Hypertonic saline Q 8 H - Chest PT   #Secondary Spontaneous Pneumothorax: Empyema status post lytics x2  - Daily CXR - CT to - 20 suction - Repeat lytics #3 , flush pigtail every shift  # Septic shock:  due to favor aspiration vs CAP and empyema #Community Acquired Pneumonia #Empyema -strep mitis and Candida glabrata - Taper Levophed  to off - Continue Ceftriaxone   - Continue Micafungin  - dc Steroids   # Mild protein calorie nutrition:  Intractable nausea and vomiting with recurrent hospital admissions -seen by GI  11/18and plan was to follow-up with general surgery/hernia specialist as outpatient at  [facility] for paraesophageal hernia Last EGD 11/17 showed Nissen from duplication wrap intact, 3 cm paraesophageal hernia, nonbleeding gastric ulcer with clean base  -Resume tube feeds -Consider surgical input after acute issues resolved - PPI  #AKI: likely pre-renal due to shock state; resolved - Avoid Nephrotoxins - Strict I/Os -  Foley catheter can be DC'd post extubation  #VTE ppx: Heparin  subQ TID  #Acute Metabolic Encephalopathy: likely due to septic shock, uremia etc... CT head negative for acute pathology Chronic opiate use, some concern for withdrawal  - Using fentanyl  drip, goal RASS 0 to -1 - Oxycodone  5 every 6  Disposition: ICU appropriate for vent support and vasopressors Goals of care -discussed with patient and daughter postextubation, she would not want tracheostomy but all other forms of resuscitation requested   Labs   CBC: Recent Labs  Lab 01/10/24 0450 01/10/24 1333 01/11/24 0303 01/13/24 0530 01/14/24 0600  WBC 13.7* 4.3 11.1* 10.2 19.6*  NEUTROABS  --  3.3  --  8.7* 17.3*  HGB 10.6* 10.9* 9.6* 7.9* 10.6*  HCT 34.5* 34.2* 30.7* 25.2* 34.0*  MCV 88.0 84.0 83.9 83.2 84.8  PLT 442* 342 262 196 242    Basic Metabolic Panel: Recent Labs  Lab 01/10/24 0939 01/10/24 1333 01/11/24 0303 01/11/24 1744 01/12/24 0540 01/13/24 0530 01/14/24 0600  NA  --    < > 132* 136 134* 138 142  K  --    < > 4.1 3.7 3.7 3.5  4.0  CL  --    < > 99 102 101 106 111  CO2  --    < > 18* 20* 20* 23 23  GLUCOSE  --    < > 127* 124* 111* 140* 112*  BUN  --    < > 46* 45* 43* 41* 36*  CREATININE  --    < > 2.61* 1.74* 1.35* 1.02* 0.79  CALCIUM   --    < > 8.8* 9.1 9.2 9.3 9.7  MG 2.2  --  2.0 2.0  --  2.3 2.5*  PHOS 8.8*  --  7.6* 6.0*  --  3.8 2.6   < > = values in this interval not displayed.   GFR: Estimated Creatinine Clearance: 48.5 mL/min (by C-G formula based on SCr of 0.79 mg/dL). Recent Labs  Lab 01/10/24 0843 01/10/24 0940 01/10/24 1333 01/11/24 0303 01/13/24 0530 01/14/24 0250 01/14/24 0559 01/14/24 0600  WBC  --   --  4.3 11.1* 10.2  --   --  19.6*  LATICACIDVEN 0.9 2.2*  --   --   --  1.3 1.6  --     Liver Function Tests: Recent Labs  Lab 01/10/24 0450 01/10/24 1333 01/11/24 0303  AST 172* 155* 101*  ALT 43 43 37  ALKPHOS 64 56 54  BILITOT 0.3 0.3 0.3  PROT 5.8* 5.1*  5.2*  ALBUMIN  3.3* 2.6* 2.5*   Recent Labs  Lab 01/10/24 0450  LIPASE 13   No results for input(s): AMMONIA in the last 168 hours.  ABG    Component Value Date/Time   PHART 7.37 01/14/2024 0320   PCO2ART 40 01/14/2024 0320   PO2ART 78 (L) 01/14/2024 0320   HCO3 23.2 01/14/2024 0320   TCO2 21 (L) 01/03/2024 1336   ACIDBASEDEF 2.2 (H) 01/14/2024 0320   O2SAT 98 01/14/2024 0320     Coagulation Profile: Recent Labs  Lab 01/10/24 0939  INR 0.9    Cardiac Enzymes: No results for input(s): CKTOTAL, CKMB, CKMBINDEX, TROPONINI in the last 168 hours.  HbA1C: No results found for: HGBA1C  CBG: Recent Labs  Lab 01/13/24 1942 01/13/24 2357 01/14/24 0130 01/14/24 0333 01/14/24 0739  GLUCAP 122* 120* 145* 132* 100*     My independent critical care time was 37 minutes  Harden Staff MD. FCCP. [facility] Pager : [phone]  If no response to pager , please call [phone] until 7 pm After 7:00 pm call Elink  [phone]   01/14/2024

## 2024-01-14 NOTE — Progress Notes (Signed)
 OT Cancellation Note  Patient Details Name: Renee Gutierrez MRN: [id number] DOB: 1961/01/31   Cancelled Treatment:    Reason Eval/Treat Not Completed: Patient not medically ready (Pt reintubated early this morning.)  Kennth Mliss Helling 01/14/2024, 8:05 AM Mliss HERO, OTR/L Acute Rehabilitation Services Office: [phone]

## 2024-01-14 NOTE — Progress Notes (Signed)
 PT Cancellation Note  Patient Details Name: Renee Gutierrez MRN: [id number] DOB: 06-27-1960   Cancelled Treatment:     Pur chart review and RN via secure chat, Pt NOT medically stable for Physical Therapy today.  Pt has been evaluated with rec for SNF.  Rehab Team to continue to follow and attempt to see another day.  Katheryn Leap  PTA Acute  Rehabilitation Services Office M-F          [phone]

## 2024-01-14 NOTE — ED Provider Notes (Signed)
 I was called to see the patient because of progressive oxygen desaturation and need to reintubate the patient.  I intubated the patient with RSI.  I have reviewed her x-ray from earlier tonight and note increased pneumothorax.  On intubation, I noticed markedly decreased breath sounds on the right.  Initial postintubation x-ray showed endotracheal tube going down the right mainstem bronchus.  ET tube was pulled back and repeat x-ray shows adequate position of the endotracheal tube, persistent right pneumothorax.  X-ray readings are per my interpretation with final interpretation by radiologist pending.  I suspect she will need a second chest tube.  Procedure Name: Intubation Date/Time: 01/14/2024 1:00 AM  Performed by: Raford Lenis, MDPre-anesthesia Checklist: Patient identified, Patient being monitored, Emergency Drugs available, Timeout performed and Suction available Oxygen Delivery Method: Non-rebreather mask Preoxygenation: Pre-oxygenation with 100% oxygen Induction Type: Rapid sequence Ventilation: Mask ventilation without difficulty Laryngoscope Size: Glidescope and 3 Grade View: Grade I Tube size: 7.5 mm Number of attempts: 1 Airway Equipment and Method: Rigid stylet and Video-laryngoscopy Placement Confirmation: ETT inserted through vocal cords under direct vision, CO2 detector and Breath sounds checked- equal and bilateral Secured at: 22 cm Tube secured with: ETT holder Dental Injury: Teeth and Oropharynx as per pre-operative assessment           Raford Lenis, MD 01/14/24 0100

## 2024-01-14 NOTE — Procedures (Signed)
 Pleural Fibrinolytic Administration Procedure Note  Renee Gutierrez  [id number]  1960/09/07  Date:01/14/24  Time:9:46 AM   Provider Performing:Jabarie Pop D. Harris   Procedure: Pleural Fibrinolysis Subsequent day (67437)  Indication(s) Fibrinolysis of complicated pleural effusion  Consent Risks of the procedure as well as the alternatives and risks of each were explained to the patient and/or caregiver.  Consent for the procedure was obtained.   Anesthesia None   Time Out Verified patient identification, verified procedure, site/side was marked, verified correct patient position, special equipment/implants available, medications/allergies/relevant history reviewed, required imaging and test results available.   Sterile Technique Hand hygiene, gloves   Procedure Description Existing pleural catheter was cleaned and accessed in sterile manner.  10mg  of tPA in 30cc of saline and 5mg  of dornase in 30cc of sterile water  were injected into pleural space using existing pleural catheter.  Catheter will be clamped for 1 hour and then placed back to suction.   Complications/Tolerance None; patient tolerated the procedure well.  EBL None   Specimen(s) None  Renee Gutierrez D. Harris, NP-C [facility] Personal contact information can be found on Amion  If no contact or response made please call [phone] 01/14/2024, 9:47 AM

## 2024-01-14 NOTE — Progress Notes (Signed)
 CPT on hold due to pt increased RR, RN aware.

## 2024-01-14 NOTE — Progress Notes (Signed)
 Cross covering ICU physician  Called to assess pt for persistent hypoxia despite intubation. As well as new hypotension. Cxr post intubation without worsening ptx. No tidaling in the atrium of R chest tube. Chest tube flushed with large fibrinous clot removed and large airleak post removal noted. Cycling freely.   Cxr post intubation without worsening ptx.   -Will start levo gtt -Stop all anti htn -Resume stress steroids -Oxygen is coming up albeit slowly with flushing of chest tube and aspiration of ~100cc fluid from pleural space -Will increase peep to 8 at this time -TV to remain ~7cc/kg for now consider going back to up to 8 based on repeat abg in ~1hour.  -Check lactate -stop propofol  -cont fentanyl  infusion with prn versed /fentanyl   Pt has mild pah noted on echo from 3 days ago and HFpEF noted as well.

## 2024-01-15 ENCOUNTER — Inpatient Hospital Stay (HOSPITAL_COMMUNITY)

## 2024-01-15 DIAGNOSIS — A419 Sepsis, unspecified organism: Secondary | ICD-10-CM | POA: Diagnosis not present

## 2024-01-15 DIAGNOSIS — J9312 Secondary spontaneous pneumothorax: Secondary | ICD-10-CM | POA: Diagnosis not present

## 2024-01-15 DIAGNOSIS — J690 Pneumonitis due to inhalation of food and vomit: Secondary | ICD-10-CM | POA: Diagnosis not present

## 2024-01-15 DIAGNOSIS — J90 Pleural effusion, not elsewhere classified: Secondary | ICD-10-CM

## 2024-01-15 DIAGNOSIS — J9601 Acute respiratory failure with hypoxia: Secondary | ICD-10-CM | POA: Diagnosis not present

## 2024-01-15 DIAGNOSIS — J80 Acute respiratory distress syndrome: Secondary | ICD-10-CM | POA: Diagnosis not present

## 2024-01-15 DIAGNOSIS — J869 Pyothorax without fistula: Secondary | ICD-10-CM | POA: Diagnosis not present

## 2024-01-15 DIAGNOSIS — E441 Mild protein-calorie malnutrition: Secondary | ICD-10-CM | POA: Diagnosis not present

## 2024-01-15 DIAGNOSIS — R6521 Severe sepsis with septic shock: Secondary | ICD-10-CM

## 2024-01-15 LAB — CBC WITH DIFFERENTIAL/PLATELET
Abs Immature Granulocytes: 0.57 K/uL — ABNORMAL HIGH (ref 0.00–0.07)
Basophils Absolute: 0.1 K/uL (ref 0.0–0.1)
Basophils Relative: 0 %
Eosinophils Absolute: 0.1 K/uL (ref 0.0–0.5)
Eosinophils Relative: 0 %
HCT: 36.1 % (ref 36.0–46.0)
Hemoglobin: 10.8 g/dL — ABNORMAL LOW (ref 12.0–15.0)
Immature Granulocytes: 2 %
Lymphocytes Relative: 3 %
Lymphs Abs: 1 K/uL (ref 0.7–4.0)
MCH: 26.3 pg (ref 26.0–34.0)
MCHC: 29.9 g/dL — ABNORMAL LOW (ref 30.0–36.0)
MCV: 88 fL (ref 80.0–100.0)
Monocytes Absolute: 0.5 K/uL (ref 0.1–1.0)
Monocytes Relative: 1 %
Neutro Abs: 34.7 K/uL — ABNORMAL HIGH (ref 1.7–7.7)
Neutrophils Relative %: 94 %
Platelets: 226 K/uL (ref 150–400)
RBC: 4.1 MIL/uL (ref 3.87–5.11)
RDW: 17.4 % — ABNORMAL HIGH (ref 11.5–15.5)
Smear Review: NORMAL
WBC: 36.9 K/uL — ABNORMAL HIGH (ref 4.0–10.5)
nRBC: 0.4 % — ABNORMAL HIGH (ref 0.0–0.2)

## 2024-01-15 LAB — BLOOD CULTURE ID PANEL (REFLEXED) - BCID2

## 2024-01-15 LAB — BASIC METABOLIC PANEL WITH GFR
Anion gap: 9 (ref 5–15)
Anion gap: 10 (ref 5–15)
BUN: 46 mg/dL — ABNORMAL HIGH (ref 8–23)
BUN: 68 mg/dL — ABNORMAL HIGH (ref 8–23)
CO2: 23 mmol/L (ref 22–32)
CO2: 23 mmol/L (ref 22–32)
Calcium: 9.3 mg/dL (ref 8.9–10.3)
Calcium: 9.6 mg/dL (ref 8.9–10.3)
Chloride: 109 mmol/L (ref 98–111)
Chloride: 109 mmol/L (ref 98–111)
Creatinine, Ser: 1.21 mg/dL — ABNORMAL HIGH (ref 0.44–1.00)
Creatinine, Ser: 1.45 mg/dL — ABNORMAL HIGH (ref 0.44–1.00)
GFR, Estimated: 50 mL/min — ABNORMAL LOW (ref >60)
GFR, Estimated: 40 mL/min — ABNORMAL LOW
Glucose, Bld: 209 mg/dL — ABNORMAL HIGH (ref 70–99)
Glucose, Bld: 119 mg/dL — ABNORMAL HIGH (ref 70–99)
Potassium: 4.9 mmol/L (ref 3.5–5.1)
Potassium: 4.3 mmol/L (ref 3.5–5.1)
Sodium: 140 mmol/L (ref 135–145)
Sodium: 142 mmol/L (ref 135–145)

## 2024-01-15 LAB — GLUCOSE, CAPILLARY
Glucose-Capillary: 103 mg/dL — ABNORMAL HIGH (ref 70–99)
Glucose-Capillary: 110 mg/dL — ABNORMAL HIGH (ref 70–99)
Glucose-Capillary: 112 mg/dL — ABNORMAL HIGH (ref 70–99)
Glucose-Capillary: 123 mg/dL — ABNORMAL HIGH (ref 70–99)
Glucose-Capillary: 124 mg/dL — ABNORMAL HIGH (ref 70–99)

## 2024-01-15 LAB — BLOOD GAS, ARTERIAL
Acid-base deficit: 3.9 mmol/L — ABNORMAL HIGH (ref 0.0–2.0)
Bicarbonate: 22.2 mmol/L (ref 20.0–28.0)
FIO2: 100 %
MECHVT: 340 mL
O2 Saturation: 98.5 %
PEEP: 5 cmH2O
Patient temperature: 37.1
RATE: 24 {breaths}/min
pCO2 arterial: 43 mmHg (ref 32–48)
pH, Arterial: 7.32 — ABNORMAL LOW (ref 7.35–7.45)
pO2, Arterial: 208 mmHg — ABNORMAL HIGH (ref 83–108)

## 2024-01-15 LAB — PHOSPHORUS: Phosphorus: 4.7 mg/dL — ABNORMAL HIGH (ref 2.5–4.6)

## 2024-01-15 LAB — CULTURE, BLOOD (ROUTINE X 2): Culture: NO GROWTH

## 2024-01-15 LAB — MAGNESIUM
Magnesium: 2.6 mg/dL — ABNORMAL HIGH (ref 1.7–2.4)
Magnesium: 2.6 mg/dL — ABNORMAL HIGH (ref 1.7–2.4)

## 2024-01-15 LAB — CHOLESTEROL, BODY FLUID: Cholesterol, Fluid: 107 mg/dL

## 2024-01-15 MED ORDER — ORAL CARE MOUTH RINSE: 15.0000 mL | OROMUCOSAL | Status: DC | PRN

## 2024-01-15 MED ORDER — ORAL CARE MOUTH RINSE: 15.0000 mL | OROMUCOSAL | Status: DC

## 2024-01-15 MED ORDER — METOPROLOL TARTRATE 5 MG/5ML IV SOLN
2.5000 mg | Freq: Once | INTRAVENOUS | Status: AC
  Administered 2024-01-15: 2.5 mg via INTRAVENOUS

## 2024-01-15 MED ORDER — ORAL CARE MOUTH RINSE
15.0000 mL | OROMUCOSAL | Status: DC
  Administered 2024-01-15 – 2024-01-16 (×12): 15 mL via OROMUCOSAL

## 2024-01-15 MED ORDER — ORAL CARE MOUTH RINSE
15.0000 mL | OROMUCOSAL | Status: DC | PRN
Start: 2024-01-15 — End: 2024-01-16

## 2024-01-15 MED ORDER — METOPROLOL TARTRATE 5 MG/5ML IV SOLN
INTRAVENOUS | Status: AC
  Filled 2024-01-15: qty 5

## 2024-01-15 MED ORDER — SODIUM CHLORIDE 0.9 % IV SOLN: INTRAVENOUS | Status: AC | PRN

## 2024-01-15 MED ORDER — VASOPRESSIN 20 UNITS/100 ML INFUSION FOR SHOCK
0.0000 [IU]/min | INTRAVENOUS | Status: DC
  Administered 2024-01-15 (×2): 0.03 [IU]/min via INTRAVENOUS
  Filled 2024-01-15 (×2): qty 100

## 2024-01-15 MED ORDER — ALBUMIN HUMAN 25 % IV SOLN
12.5000 g | Freq: Once | INTRAVENOUS | Status: AC
  Administered 2024-01-15: 12.5 g via INTRAVENOUS
  Filled 2024-01-15: qty 50

## 2024-01-15 MED ORDER — SODIUM CHLORIDE 0.9 % IV SOLN
0.5000 mg/h | INTRAVENOUS | Status: DC
  Administered 2024-01-15: 1.5 mg/h via INTRAVENOUS
  Administered 2024-01-17: 0.5 mg/h via INTRAVENOUS
  Administered 2024-01-18 – 2024-01-19 (×2): 2.5 mg/h via INTRAVENOUS
  Administered 2024-01-19 – 2024-01-21 (×3): 3 mg/h via INTRAVENOUS
  Filled 2024-01-15 (×9): qty 5

## 2024-01-15 MED ORDER — LACTATED RINGERS IV BOLUS
500.0000 mL | Freq: Once | INTRAVENOUS | Status: AC
  Administered 2024-01-15: 500 mL via INTRAVENOUS

## 2024-01-15 MED ORDER — METOPROLOL TARTRATE 5 MG/5ML IV SOLN
5.0000 mg | Freq: Once | INTRAVENOUS | Status: AC
Start: 2024-01-16 — End: 2024-01-15
  Administered 2024-01-15: 5 mg via INTRAVENOUS
  Filled 2024-01-15: qty 5

## 2024-01-15 MED ORDER — VITAL HP 1.0 CAL PO LIQD
1000.0000 mL | ORAL | Status: DC
  Administered 2024-01-15: 1000 mL

## 2024-01-15 MED ORDER — PROSOURCE TF20 ENFIT COMPATIBL EN LIQD: 60.0000 mL | Freq: Every day | ENTERAL | Status: DC

## 2024-01-15 NOTE — Consult Note (Addendum)
 [facility]    Date of Admission:  01/10/2024     Reason for Consult: empyema, fungemia, septic shock    Referring Provider: Jude Donning     Lines:  11/22-c right femoral central line 11/22-c right sided chest tube 11/26-c ett  Abx: 11/22-c micafungin  11/22-c ceftriaxone   11/22-24 azithromycin         Assessment: 63 yo female with empysema, gerd, fibromyalgia, admitted to ap ed 11/22 with ams, chest pain, hypoxemic resp failure in setting lobar pna and right sided pleural effusion/pneumothorax, subsequently developed septic shock/ards in setting empyema/fungemia    11/22 blood cx candida glabrata 11/22 mrsa nares pcr negative 11/22 rvp negative 11/22 tracheal aspirate cx few normal respiratory flora -- no staph aureus or pseudomonas 11/22 right pleural fluid >2500 ldh Cx strep mitis (R penicillin; S ceftriaxone , levoflox), strep parasanguinis (I pcn; S ceftriaxone ), candida glabrata (sent to [facility] for further testing)     11/22 initial chest ct mainly right lung infiltrate and moderate pleural effusion; bcx as above 11/22 tte < 50% respiratory variation; no valvular veg; grade 1 diastolic dysfunction 11/22 chest tube placed; s/p 3 doses thrombolysis 11/26 septic shock/ards 11/27 xray bilateral opacity; small-mod right pleural effusion; right apical pneumothorax not seen today   Patient appears to have developed ards/shock in setting aspiration pna complicated by empyema/right sided pleural effusion  The chest tube with thrombolysis appears to be working though for empyema   Plan: Continue ceftriaxone  and micafungin   Repeat bcx to ascertain clearance fungemia -- if persistent will need metastatic foci workup Leukocytosis likely reactive in setting ards/stress dose hydrocortisone /pressors (but as above repeat bcx sent r/o other source of infection) Her pressor requirement is coming down now 24 hours into initiation and so  will not broaden abx regimen The right internal jugular central line was placed when she was fungemic and if feasible to remove, please remove to prevent relapse Can defer ophthalmologic exam to outpatient Maintain standard isolation precaution Vent/supportive care per pulm ccm team      ------------------------------------------------ Principal Problem:   Septic shock (HCC) Active Problems:   Acute hypoxic respiratory failure (HCC)   Empyema (HCC)   Malnutrition of moderate degree    HPI: Renee Gutierrez is a 63 y.o. female  with empysema, gerd, fibromyalgia, admitted to ap ed 11/22 with ams, chest pain, hypoxemic resp failure in setting lobar pna and right sided pleural effusion/pneumothorax, subsequently developed septic shock/ards in setting empyema/fungemia   Patient intubated Daughter in law by bedside Hx via discussion with pulm/ccm dr Jude and family  Patient on admission 69% o2 sat Narcan  was given with improved mentation and saturation ?chronic pain meds for fibromyalgia? Cta with rll opacity, right pleural effusion/pneumothorax Right chest tube placed cx viridan strep/candida Bcx returned quickly as well with albican Started on appropriate abx including micafungin  --> now transitioned to ceftriaxone /micafungin   On HD # 3 worsening hemodynamics/hypoxemic respiratory failure acutely after initial improvement required intubation Repeat imaging showed bilateral opacity; resolved pneumothorax  Wbc up to 37. She did get stress dose hydrocortisone  11/22-11/26     Family History  Problem Relation Age of Onset   Throat cancer Mother    Bladder Cancer Father    Liver cancer Father    Colon cancer Neg Hx     Social History   Tobacco Use   Smoking status: Every Day    Types: Cigarettes   Smokeless tobacco: Never   Tobacco comments:  1 cigarette daily   Vaping Use   Vaping status: Never Used  Substance Use Topics   Alcohol use: No   Drug use: No     Allergies  Allergen Reactions   Buprenorphine Hcl Swelling    All extremities and neck and face swelled up huge according to patient   Bupropion Swelling and Other (See Comments)    Moody and depressed   Codeine Itching, Nausea And Vomiting, Nausea Only and Swelling   Sulfamethoxazole-Trimethoprim Swelling and Other (See Comments)    Swelled up her eye   Nsaids Other (See Comments)    Unknown    Sulfa Antibiotics Other (See Comments)    Unknown    Tapentadol Nausea And Vomiting    Pt states she can take   Clindamycin Other (See Comments)    Heart burn   Clindamycin/Lincomycin Rash and Other (See Comments)    Heart burn   Cymbalta  [Duloxetine  Hcl] Swelling   Diclofenac Swelling    Cream    Diclofenac Sodium Swelling    Oral med   Gabapentin Swelling   Oxycodone  Nausea Only    No longer has a reaction per pt   Toradol  [Ketorolac  Tromethamine ] Rash    Review of Systems: ROS All Other ROS was negative, except mentioned above   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       Scheduled Meds:  arformoterol   15 mcg Nebulization BID   budesonide  (PULMICORT ) nebulizer solution  0.25 mg Nebulization BID   Chlorhexidine  Gluconate Cloth  6 each Topical Daily   docusate  100 mg Per Tube BID   DULoxetine   60 mg Oral BID   heparin   5,000 Units Subcutaneous Q8H   nicotine   14 mg Transdermal Daily   mouth rinse  15 mL Mouth Rinse Q2H   oxyCODONE   10 mg Per Tube Q6H   pantoprazole  (PROTONIX ) IV  40 mg Intravenous QHS   phenytoin   100 mg Per  Tube BID   polyethylene glycol  17 g Per Tube Daily   prednisoLONE  acetate  1 drop Left Eye Daily   pregabalin   75 mg Per Tube TID   revefenacin   175 mcg Nebulization Daily   senna  1 tablet Per Tube Daily   sodium chloride  flush  10 mL Intrapleural Q8H   sodium chloride  flush  10 mL Intrapleural Q8H   sucralfate   1 g Per Tube TID WC & HS   Continuous Infusions:  sodium chloride      cefTRIAXone  (ROCEPHIN )  IV 10 mL/hr at 01/15/24 0900   HYDROmorphone  1 mg/hr (01/15/24 0900)   micafungin  (MYCAMINE ) 100 mg in sodium chloride  0.9 % 100 mL IVPB Stopped (01/14/24 1454)   norepinephrine  (LEVOPHED ) Adult infusion 13 mcg/min (01/15/24 0900)   vasopressin  0.03 Units/min (01/15/24 0900)   PRN Meds:.Place/Maintain arterial line **AND** sodium chloride , acetaminophen  **OR** acetaminophen , alum & mag hydroxide-simeth, HYDROmorphone , HYDROmorphone  (DILAUDID ) injection, ipratropium-albuterol , midazolam  PF, mouth rinse, mouth rinse, phenol   OBJECTIVE:  Blood pressure 123/65, pulse 97, temperature 99.3 F (37.4 C), resp. rate (!) 24, height 4' 11 (1.499 m), weight 48.1 kg, SpO2 97%.  Physical Exam    Lab Results Lab Results  Component Value Date   WBC 36.9 (H) 01/15/2024   HGB 10.8 (L) 01/15/2024   HCT 36.1 01/15/2024   MCV 88.0 01/15/2024   PLT 226 01/15/2024    Lab Results  Component Value Date   CREATININE 1.21 (H) 01/15/2024   BUN 46 (H) 01/15/2024   NA 140 01/15/2024   K 4.9 01/15/2024   CL 109 01/15/2024   CO2 23 01/15/2024    Lab Results  Component Value Date   ALT 37 01/11/2024   AST 101 (H) 01/11/2024   ALKPHOS 54 01/11/2024   BILITOT 0.3 01/11/2024      Microbiology: Recent Results (from the past 240 hours)  Gram stain     Status: None   Collection Time: 01/10/24  7:21 AM   Specimen: Pleura  Result Value Ref Range Status   Specimen Description PLEURAL  Final   Special Requests NONE  Final   Gram Stain   Final    BUDDING YEAST SEEN FEW GRAM POSITIVE COCCI IN  CHAINS MODERATE WBC PRESENT,BOTH PMN AND MONONUCLEAR CYTOSPIN SMEAR PLEURAL Gram Stain Report Called to,Read Back By and Verified With: BELCHER @ 1125 ON [id number] BY HENDERSON L Performed at [facility], [address], [place], [place] [address]    Report Status 01/10/2024 FINAL  Final  Culture, body fluid w Gram Stain-bottle     Status: Abnormal   Collection Time: 01/10/24  7:21 AM   Specimen: Pleura  Result Value Ref Range Status   Specimen Description   Final    PLEURAL BOTTLES DRAWN AEROBIC AND ANAEROBIC Performed at [facility], [address], [place], [place] [address]    Special Requests   Final    10CC Performed at [facility], [address], [place], [place] [address]    Gram Stain   Final    IN BOTH AEROBIC AND ANAEROBIC BOTTLES GRAM POSITIVE COCCI BUDDING YEAST SEEN WBC PRESENT,BOTH PMN AND MONONUCLEAR CYTOSPIN SMEAR Gram Stain Report Called to,Read Back By and Verified With: KENZIE R. ON 01/10/2024 @1 :58PM BY T. HAMER  Performed at [facility], [address] [place], [place] [address]    Culture STREPTOCOCCUS PARASANGUINIS CANDIDA GLABRATA  (A)  Final   Report Status 01/13/2024 FINAL  Final   Organism ID, Bacteria STREPTOCOCCUS PARASANGUINIS  Final      Susceptibility   Streptococcus parasanguinis - MIC*    PENICILLIN 1 INTERMEDIATE Intermediate     CEFTRIAXONE  1 SENSITIVE Sensitive     LEVOFLOXACIN 1 SENSITIVE Sensitive     VANCOMYCIN 0.5 SENSITIVE Sensitive     * STREPTOCOCCUS PARASANGUINIS  Culture, blood (Routine X 2) w Reflex to ID Panel     Status: Abnormal (Preliminary result)   Collection Time: 01/10/24  8:43 AM   Specimen: Blood  Result Value Ref Range Status   Specimen Description   Final    RT CENTRAL LINE BOTTLES DRAWN AEROBIC AND ANAEROBIC Performed at [facility], [address], [place], [place] [address]    Special Requests   Final    Blood Culture adequate volume Performed at [facility], [address] [place], [place] [address]     Culture  Setup Time (A)  Final    YEAST AEROBIC BOTTLE ONLY Gram Stain Report Called to,Read Back By and Verified With: A. CONTRERAS @WL   [id number], VIRAY,J CRITICAL RESULT CALLED TO, READ BACK BY AND VERIFIED WITH: PHARMD E JACKSON 01/15/2024 @ 0107 BY AB    Culture   Final    YEAST CULTURE REINCUBATED FOR BETTER GROWTH Performed at [facility], [address], [place], [place] [address]    Report Status PENDING  Incomplete  Culture, blood (Routine X 2) w Reflex to ID Panel     Status: None   Collection Time: 01/10/24  8:43 AM   Specimen: Blood  Result Value Ref Range Status   Specimen Description AEROBIC BOTTLE ONLY RT FEMORAL  Final   Special Requests   Final    Blood Culture results may not be optimal due to an inadequate volume of blood received in culture bottles   Culture   Final    NO GROWTH 5 DAYS Performed at [facility], [address], [place], [place] [address]    Report Status 01/15/2024 FINAL  Final  Blood Culture ID Panel (Reflexed)     Status: Abnormal   Collection Time: 01/10/24  8:43 AM  Result Value Ref Range Status   Enterococcus faecalis NOT DETECTED NOT DETECTED Final   Enterococcus Faecium NOT DETECTED NOT DETECTED Final   Listeria monocytogenes NOT DETECTED NOT DETECTED Final   Staphylococcus species NOT DETECTED NOT DETECTED Final   Staphylococcus aureus (BCID) NOT DETECTED NOT DETECTED Final   Staphylococcus epidermidis NOT DETECTED NOT DETECTED Final   Staphylococcus lugdunensis NOT DETECTED NOT DETECTED Final   Streptococcus species NOT DETECTED NOT DETECTED Final   Streptococcus agalactiae NOT DETECTED NOT DETECTED Final   Streptococcus pneumoniae NOT DETECTED NOT DETECTED Final   Streptococcus pyogenes NOT DETECTED NOT DETECTED Final   A.calcoaceticus-baumannii NOT DETECTED NOT DETECTED Final   Bacteroides fragilis NOT DETECTED NOT DETECTED Final   Enterobacterales NOT DETECTED NOT DETECTED Final   Enterobacter cloacae complex NOT  DETECTED NOT DETECTED Final   Escherichia coli NOT DETECTED NOT DETECTED Final   Klebsiella aerogenes NOT DETECTED NOT DETECTED Final   Klebsiella oxytoca NOT DETECTED NOT DETECTED Final   Klebsiella pneumoniae NOT DETECTED NOT DETECTED Final   Proteus species NOT DETECTED NOT DETECTED Final   Salmonella species NOT DETECTED NOT DETECTED Final   Serratia marcescens NOT DETECTED NOT DETECTED Final   Haemophilus influenzae NOT DETECTED NOT DETECTED Final   Neisseria meningitidis NOT DETECTED NOT DETECTED Final   Pseudomonas aeruginosa NOT DETECTED NOT DETECTED Final   Stenotrophomonas maltophilia NOT DETECTED NOT DETECTED Final   Candida albicans NOT DETECTED NOT DETECTED Final   Candida auris NOT DETECTED NOT DETECTED Final   Candida glabrata DETECTED (A) NOT DETECTED Final    Comment: CRITICAL RESULT CALLED TO, READ BACK BY AND VERIFIED WITH: PHARMD E JACKSON 01/15/2024 @ 0107 BY AB    Candida krusei NOT DETECTED NOT DETECTED Final   Candida parapsilosis NOT DETECTED NOT DETECTED Final   Candida tropicalis NOT DETECTED NOT DETECTED Final   Cryptococcus neoformans/gattii NOT DETECTED NOT DETECTED Final    Comment: Performed at [facility], [address] [place], [place] [address]  MRSA Next Gen by PCR, Nasal     Status: None   Collection Time: 01/10/24 12:11 PM   Specimen: Nasal Mucosa; Nasal Swab  Result Value Ref Range Status   MRSA by PCR Next Gen NOT DETECTED NOT DETECTED Final    Comment: (NOTE) The GeneXpert MRSA Assay (FDA approved for NASAL specimens only), is one component of a comprehensive MRSA colonization surveillance program. It  is not intended to diagnose MRSA infection nor to guide or monitor treatment for MRSA infections. Test performance is not FDA approved in patients less than 69 years old. Performed at [facility], [address], [place], [place] [address]   Culture, Respiratory w Gram Stain (tracheal aspirate)     Status: None    Collection Time: 01/10/24 12:12 PM   Specimen: Tracheal Aspirate; Respiratory  Result Value Ref Range Status   Specimen Description   Final    TRACHEAL ASPIRATE Performed at [facility], [address] [place], [place] [address]    Special Requests   Final    NONE Performed at [facility], [address], [place], [place] [address]    Gram Stain   Final    RARE WBC PRESENT, PREDOMINANTLY PMN FEW GRAM POSITIVE COCCI    Culture   Final    FEW Normal respiratory flora-no Staph aureus or Pseudomonas seen Performed at [facility], [address] [place], [place] [address]    Report Status 01/12/2024 FINAL  Final  Body fluid culture w Gram Stain     Status: None (Preliminary result)   Collection Time: 01/10/24 12:19 PM   Specimen: Pleural Fluid  Result Value Ref Range Status   Specimen Description   Final    PLEURAL Performed at [facility], [address] [place], [place] [address]    Special Requests   Final    NONE Performed at [facility], [address] [place], [place] [address]    Gram Stain   Final    RARE WBC SEEN FEW GRAM POSITIVE COCCI RARE GRAM POSITIVE RODS RARE YEAST    Culture   Final    FEW STREPTOCOCCUS MITIS/ORALIS FEW CANDIDA GLABRATA Sent to [facility] for further susceptibility testing. Performed at [facility], [address], [place], [place] [address]    Report Status PENDING  Incomplete   Organism ID, Bacteria STREPTOCOCCUS MITIS/ORALIS  Final      Susceptibility   Streptococcus mitis/oralis - MIC*    PENICILLIN >=8 RESISTANT Resistant     CEFTRIAXONE  <=0.12 SENSITIVE Sensitive     LEVOFLOXACIN 2 SENSITIVE Sensitive     VANCOMYCIN 0.5 SENSITIVE Sensitive     * FEW STREPTOCOCCUS MITIS/ORALIS  Fungus Culture With Stain     Status: Abnormal (Preliminary result)   Collection Time: 01/10/24 12:19 PM   Specimen: Pleural Fluid  Result Value Ref Range  Status   Fungus Stain Final report (A)  Final    Comment: (NOTE) Performed At: [facility] [address] [place], [place] [id number] Jennette Shorter MD Ey:[id number]    Fungus (Mycology) Culture PENDING  Incomplete   Fungal Source PLEURAL  Final    Comment: Performed at [facility], [address] [place], [place] [address]  Fungus Culture Result     Status: Abnormal   Collection Time: 01/10/24 12:19 PM  Result Value Ref Range Status   Result 1 Yeast observed (A)  Final    Comment: (NOTE) Performed At: [facility] [address] [place], [place] [id number] Jennette Shorter MD Ey:[id number]   Respiratory (~20 pathogens) panel by PCR     Status: None   Collection Time: 01/10/24  1:34 PM   Specimen: Nasopharyngeal Swab; Respiratory  Result Value Ref Range Status   Adenovirus NOT DETECTED NOT DETECTED Final   Coronavirus 229E NOT DETECTED NOT DETECTED Final    Comment: (NOTE)  The Coronavirus on the Respiratory Panel, DOES NOT test for the novel  Coronavirus (2019 nCoV)    Coronavirus HKU1 NOT DETECTED NOT DETECTED Final   Coronavirus NL63 NOT DETECTED NOT DETECTED Final   Coronavirus OC43 NOT DETECTED NOT DETECTED Final   Metapneumovirus NOT DETECTED NOT DETECTED Final   Rhinovirus / Enterovirus NOT DETECTED NOT DETECTED Final   Influenza A NOT DETECTED NOT DETECTED Final   Influenza B NOT DETECTED NOT DETECTED Final   Parainfluenza Virus 1 NOT DETECTED NOT DETECTED Final   Parainfluenza Virus 2 NOT DETECTED NOT DETECTED Final   Parainfluenza Virus 3 NOT DETECTED NOT DETECTED Final   Parainfluenza Virus 4 NOT DETECTED NOT DETECTED Final   Respiratory Syncytial Virus NOT DETECTED NOT DETECTED Final   Bordetella pertussis NOT DETECTED NOT DETECTED Final   Bordetella Parapertussis NOT DETECTED NOT DETECTED Final   Chlamydophila pneumoniae NOT DETECTED NOT DETECTED Final   Mycoplasma pneumoniae NOT DETECTED NOT DETECTED Final    Comment:  Performed at [facility], [address] [place], [place] [address]  Resp panel by RT-PCR (RSV, Flu A&B, Covid) Anterior Nasal Swab     Status: None   Collection Time: 01/10/24  1:34 PM   Specimen: Anterior Nasal Swab  Result Value Ref Range Status   SARS Coronavirus 2 by RT PCR NEGATIVE NEGATIVE Final    Comment: (NOTE) SARS-CoV-2 target nucleic acids are NOT DETECTED.  The SARS-CoV-2 RNA is generally detectable in upper respiratory specimens during the acute phase of infection. The lowest concentration of SARS-CoV-2 viral copies this assay can detect is 138 copies/mL. A negative result does not preclude SARS-Cov-2 infection and should not be used as the sole basis for treatment or other patient management decisions. A negative result may occur with  improper specimen collection/handling, submission of specimen other than nasopharyngeal swab, presence of viral mutation(s) within the areas targeted by this assay, and inadequate number of viral copies(<138 copies/mL). A negative result must be combined with clinical observations, patient history, and epidemiological information. The expected result is Negative.  Fact Sheet for Patients:  bloggercourse.com  Fact Sheet for Healthcare Providers:  seriousbroker.it  This test is no t yet approved or cleared by the [place]  FDA and  has been authorized for detection and/or diagnosis of SARS-CoV-2 by FDA under an Emergency Use Authorization (EUA). This EUA will remain  in effect (meaning this test can be used) for the duration of the COVID-19 declaration under Section 564(b)(1) of the Act, 21 U.S.C.section 360bbb-3(b)(1), unless the authorization is terminated  or revoked sooner.       Influenza A by PCR NEGATIVE NEGATIVE Final   Influenza B by PCR NEGATIVE NEGATIVE Final    Comment: (NOTE) The Xpert Xpress SARS-CoV-2/FLU/RSV plus assay is intended as an aid in the  diagnosis of influenza from Nasopharyngeal swab specimens and should not be used as a sole basis for treatment. Nasal washings and aspirates are unacceptable for Xpert Xpress SARS-CoV-2/FLU/RSV testing.  Fact Sheet for Patients: bloggercourse.com  Fact Sheet for Healthcare Providers: seriousbroker.it  This test is not yet approved or cleared by the [place]  FDA and has been authorized for detection and/or diagnosis of SARS-CoV-2 by FDA under an Emergency Use Authorization (EUA). This EUA will remain in effect (meaning this test can be used) for the duration of the COVID-19 declaration under Section 564(b)(1) of the Act, 21 U.S.C. section 360bbb-3(b)(1), unless the authorization is terminated or revoked.     Resp Syncytial Virus by  PCR NEGATIVE NEGATIVE Final    Comment: (NOTE) Fact Sheet for Patients: bloggercourse.com  Fact Sheet for Healthcare Providers: seriousbroker.it  This test is not yet approved or cleared by the [place]  FDA and has been authorized for detection and/or diagnosis of SARS-CoV-2 by FDA under an Emergency Use Authorization (EUA). This EUA will remain in effect (meaning this test can be used) for the duration of the COVID-19 declaration under Section 564(b)(1) of the Act, 21 U.S.C. section 360bbb-3(b)(1), unless the authorization is terminated or revoked.  Performed at [facility], [address] [place], [place] [address]   Yeast Susceptibilities     Status: None   Collection Time: 01/10/24  2:12 PM  Result Value Ref Range Status   SOURCE LAB 10504 C GLABRATA PLEURAL FLD SENSI  Corrected    Comment: Performed at [facility], [address], [place], [place] [address] CORRECTED ON 11/25 AT 0845: PREVIOUSLY REPORTED AS LAB 10504 C GLABRATA BLOOD SENSI    Organism ID, Yeast Preliminary report  Final    Comment:  (NOTE) Specimen has been received and testing has been initiated. Performed At: [facility] [address] [place], [place] [id number] Jennette Shorter MD Ey:[phone]    Amphotericin B MIC PENDING  Incomplete   Anidulafungin MIC PENDING  Incomplete   Caspofungin MIC PENDING  Incomplete   Fluconazole Islt MIC PENDING  Incomplete   ISAVUCONAZOLE MIC PENDING  Incomplete   Itraconazole MIC PENDING  Incomplete   Micafungin  MIC PENDING  Incomplete   Posaconazole MIC PENDING  Incomplete   REZAFUNGIN MIC PENDING  Incomplete   Voriconazole MIC PENDING  Incomplete     Serology:    Imaging: If present, new imagings (plain films, ct scans, and mri) have been personally visualized and interpreted; radiology reports have been reviewed. Decision making incorporated into the Impression / Recommendations.   11/27 cxr 1. Right greater than left interstitial and patchy airspace disease with widespread distribution and relative left apical sparing, with slight improvement on the left and no change on the right. 2. Small to moderate right pleural effusion. 3. Cardiomegaly without gross vascular congestion. 4. Right apical pneumothorax is not seen today. Pigtail chest tube remains in place.   11/26 cxr 1. Improved right apical pneumothorax with pigtail tube thoracostomy in the right chest base and no mediastinal shift. 2. Moderate interstitial edema with interval worsening. 3. Airspace disease denser and more confluent in the left lung with interval increase in the right upper lung field. Right lower lung field largely obscured by the pleural effusion. 4. Moderate right and small left pleural effusions.   11/22 kub 1. Interval advancement of the enteric tube with tip in the distal stomach. 2. Bilateral pleural effusions with associated bibasilar atelectasis similar or slightly increased since the prior radiograph.   11/22 cta chest 1. No pulmonary embolism. 2. Small to moderate  right pneumothorax, new. 3. Multilobar right lung consolidation and atelectasis. Imaging findings may reflect pneumonia and/or aspiration. 4. Increased small to moderate right pleural effusion. 5. Decreased small left pleural effusion. 6. Aortic atherosclerosis and emphysema.   11/22 head ct No acute intracranial abnormality   11/22 tte  1. Left ventricular ejection fraction, by estimation, is 70 to 75%. The  left ventricle has hyperdynamic function. The left ventricle has no  regional wall motion abnormalities. Left ventricular diastolic parameters are consistent with Grade I diastolic  dysfunction (impaired relaxation).   2. Right ventricular systolic function is normal. The right ventricular  size is normal. There is mildly elevated pulmonary artery systolic  pressure. The estimated right ventricular systolic pressure is 40.3 mmHg.   3. The mitral valve is grossly normal. No evidence of mitral valve  regurgitation. No evidence of mitral stenosis.   4. The aortic valve is tricuspid. Aortic valve regurgitation is not  visualized. No aortic stenosis is present.   5. The inferior vena cava is normal in size with <50% respiratory  variability, suggesting right atrial pressure of 8 mmHg.      Constance ONEIDA Passer, MD [facility] [phone] pager    01/15/2024, 12:29 PM

## 2024-01-15 NOTE — Progress Notes (Signed)
 Seems like pigtail has migrated & is almost out of the chest. Large RT effusion persists. Will request IR to replace since loculated  Angeligue Bowne V. Jude MD

## 2024-01-15 NOTE — Progress Notes (Signed)
 Two RT's attempted A-line and was not able to place. MD notified and RN aware.

## 2024-01-15 NOTE — Progress Notes (Signed)
 PHARMACY - PHYSICIAN COMMUNICATION CRITICAL VALUE ALERT - BLOOD CULTURE IDENTIFICATION (BCID)  Renee Gutierrez is an 63 y.o. female who presented to [facility] on 01/10/2024 with a chief complaint of RLL pneumonia, R secondary spontaneous pneumothorax, and concern for opiate toxidrome   Assessment: 1/3 canidida glabrata  Name of physician (or Provider) ContactedBETHA Hutching  Current antibiotics: micafungin   Changes to prescribed antibiotics recommended:  Patient is on recommended antibiotics - No changes needed  Results for orders placed or performed during the hospital encounter of 01/10/24  Blood Culture ID Panel (Reflexed) (Collected: 01/10/2024  8:43 AM)  Result Value Ref Range   Enterococcus faecalis NOT DETECTED NOT DETECTED   Enterococcus Faecium NOT DETECTED NOT DETECTED   Listeria monocytogenes NOT DETECTED NOT DETECTED   Staphylococcus species NOT DETECTED NOT DETECTED   Staphylococcus aureus (BCID) NOT DETECTED NOT DETECTED   Staphylococcus epidermidis NOT DETECTED NOT DETECTED   Staphylococcus lugdunensis NOT DETECTED NOT DETECTED   Streptococcus species NOT DETECTED NOT DETECTED   Streptococcus agalactiae NOT DETECTED NOT DETECTED   Streptococcus pneumoniae NOT DETECTED NOT DETECTED   Streptococcus pyogenes NOT DETECTED NOT DETECTED   A.calcoaceticus-baumannii NOT DETECTED NOT DETECTED   Bacteroides fragilis NOT DETECTED NOT DETECTED   Enterobacterales NOT DETECTED NOT DETECTED   Enterobacter cloacae complex NOT DETECTED NOT DETECTED   Escherichia coli NOT DETECTED NOT DETECTED   Klebsiella aerogenes NOT DETECTED NOT DETECTED   Klebsiella oxytoca NOT DETECTED NOT DETECTED   Klebsiella pneumoniae NOT DETECTED NOT DETECTED   Proteus species NOT DETECTED NOT DETECTED   Salmonella species NOT DETECTED NOT DETECTED   Serratia marcescens NOT DETECTED NOT DETECTED   Haemophilus influenzae NOT DETECTED NOT DETECTED   Neisseria meningitidis NOT DETECTED NOT DETECTED    Pseudomonas aeruginosa NOT DETECTED NOT DETECTED   Stenotrophomonas maltophilia NOT DETECTED NOT DETECTED   Candida albicans NOT DETECTED NOT DETECTED   Candida auris NOT DETECTED NOT DETECTED   Candida glabrata DETECTED (A) NOT DETECTED   Candida krusei NOT DETECTED NOT DETECTED   Candida parapsilosis NOT DETECTED NOT DETECTED   Candida tropicalis NOT DETECTED NOT DETECTED   Cryptococcus neoformans/gattii NOT DETECTED NOT DETECTED    Leeroy Mace RPh 01/15/2024, 1:20 AM

## 2024-01-15 NOTE — Procedures (Signed)
 Arterial Catheter Insertion Procedure Note  LIANNAH YARBOUGH  [id number]  March 23, 1960  Date:01/15/24  Time:12:16 PM    Provider Performing: Harden GAILS. Jude    Procedure: Insertion of Arterial Line (63379) with US  guidance (23062)   Indication(s) Blood pressure monitoring and/or need for frequent ABGs  Consent Risks of the procedure as well as the alternatives and risks of each were explained to the patient and/or caregiver.  Consent for the procedure was obtained and is signed in the bedside chart  Anesthesia None   Time Out Verified patient identification, verified procedure, site/side was marked, verified correct patient position, special equipment/implants available, medications/allergies/relevant history reviewed, required imaging and test results available.   Sterile Technique Maximal sterile technique including full sterile barrier drape, hand hygiene, sterile gown, sterile gloves, mask, hair covering, sterile ultrasound probe cover (if used).   Procedure Description Area of catheter insertion was cleaned with chlorhexidine  and draped in sterile fashion. With real-time ultrasound guidance an arterial catheter was placed into the right femoral artery.  Appropriate arterial tracings confirmed on monitor.     Complications/Tolerance None; patient tolerated the procedure well.   EBL Minimal   Specimen(s) None  Shakiyah Cirilo V. Jude MD

## 2024-01-15 NOTE — Progress Notes (Signed)
 NAME:  Renee Gutierrez, MRN:  [id number], DOB:  November 04, 1960, LOS: 5 ADMISSION DATE:  01/10/2024, CONSULTATION DATE:  @TODAY @ REFERRING MD:  [facility], CHIEF COMPLAINT:  acute hypoxic respiratory failure   History of Present Illness:  Renee Gutierrez is a 63 y/o F with a PMH significant for HTN, fibromyalgia, GERD, emphysema who presented to [facility] ED for severe chest pain, somnolence, and hypoxia found to have RLL pneumonia, R secondary spontaneous pneumothorax, and concern for opiate toxidrome with ED course c/b worsening encephalopathy requiring invasive mechanical ventilation transferred to [facility] for CCM evaluation.  Per ED notes, the patient presented with somnolence, hypoxia, hypotension. Initial concern for opiate toxidrome. O2 SATS 69%. She was given small dose of narcan  with decent response, more alert and breathing, O2 sats improved. Patient in more pain. CTA chest performed and showed RLL consolidation, R pleural effusion, and pneumothorax. A R pigtail chest tube was placed. The patient initially required NE and request was made to transfer patient to ICU at [facility] or [facility]. She was also placed on a narcan  drip for opiate toxidrome.  On 11/22 AM, ED provider switched and patient had a respiratory decompensation, increasingly agitated, pulling on O2 and Ivs, more hypoxic. Decision was made to intubate patient and a femoral central line was placed. The patient was slated to come to [facility] for ICU level care.  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 and presence of a nonbleeding gastric ulcer measuring 2 cm and with a nonbleeding visible vessel which was ablated with bipolar probe  11/21 --> presented to [facility] ED, opiate toxidrome, narcan  drip, CTA chest showed R pneumo, chest tube placed 11/21 --> more altered, agitated, hypoxia, intubated, CVC placed, transferred to [facility] 11/23 lytics x 1 11/24 lytics x 2 11/25 extubated but reintubated by evening due to pigtail blockage & RT pneumo, flushed pigtail and removal of fibrinous clot 11/26 lytics #3  Interim History / Subjective:   Critically ill, intubated On higher doses of Levophed  Chest tube output 850 cc Urine output slowing down 250 cc Febrile 101 yesterday p.m., now defervesced  Lines/Tubes: R pigtail CT, ETT, NGT, R Fem CVC, PIV, Foley  Objective    Blood pressure (!) 101/52, pulse 96, temperature 99 F (37.2 C), resp. rate (!) 24, height 4' 11 (1.499 m), weight 48.1 kg, SpO2 92%.    Vent Mode: PRVC FiO2 (%):  [100 %] 100 % Set Rate:  [18 bmp-24 bmp] 24 bmp  Vt Set:  [300 mL-360 mL] 360 mL PEEP:  [8 cmH20] 8 cmH20 Plateau Pressure:  [19 cmH20-24 cmH20] 23 cmH20   Intake/Output Summary (Last 24 hours) at 01/15/2024 9171 Last data filed at 01/15/2024 9345 Gross per 24 hour  Intake 1476.89 ml  Output 1190 ml  Net 286.89 ml   Filed Weights   01/13/24 0355 01/14/24 0500 01/15/24 0449  Weight: 46.9 kg 47.2 kg 48.1 kg    Examination: General: chronically ill appearing, intubated, sedate Eyes: hazy left pupil  opacity, anisocoria, right 2 mm reactive to light ENT: intubated Skin: warm, intact, no rashes Neck: JVD flat CV: RRR, no MRG, nl S1 and S2, no peripheral edema Resp: Bilateral ventilated breath sounds, decreased on right, chest tube draining sanguinous fluid Abdom: soft, nontender, nondistended, no hepatosplenomegaly Neuro: RASS -2 on Dilaudid  drip  Labs show increased leukocytosis, normal electrolytes,Worsening renal function 46/1.2 Chest x-ray 11/27 shows resolved right pneumothorax, right effusion, bilateral patchy interstitial infiltrates Blood cultures now showing yeast  Resolved problem list   #High anion gap metabolic acidosis    Assessment and Plan    #Acute Hypoxic Respiratory Failure: likely due to aspiration, empyema, R pneumothorax. Reintubated 11/25 due to right pneumothorax/pigtail blockage - Low tidal volume ventilation - Lowest FiO2 to keep SPO2 > 90% and PaO2 > 65 mmHg , keep PEEP at 8 -Not a great candidate for proning, will consider paralytic if hypoxia worsens, maintain deep sedation RASS -3 with Dilaudid  drip - Daily SAT/SBT as tolerated with goal extubation - Triple therapy nebs - Hypertonic saline Q 8 H - Chest PT   #Secondary Spontaneous Pneumothorax: Empyema status post lytics x3  - Daily CXR - CT to - 20 suction - flush pigtail every shift  # Septic shock:  due to fungemia # Aspiration pneumonia #Empyema -strep mitis and Candida glabrata - Titrate Levophed  to MAP 65, insert arterial line for hemodynamic monitoring, add vasopressin  - Continue Ceftriaxone   - Continue Micafungin  - dc Steroids   # Mild protein calorie nutrition:  Intractable nausea and vomiting with recurrent hospital admissions -seen by GI  11/18 and plan was to follow-up with general surgery/hernia specialist as outpatient at  [facility] for paraesophageal hernia Last EGD 11/17 showed Nissen from duplication wrap intact, 3 cm paraesophageal hernia, nonbleeding gastric ulcer  with clean base  -Resume trickle tube feeds -Consider surgical input after acute issues resolved - PPI  #AKI: likely pre-renal due to shock state; resolved but creatinine rising again - Avoid Nephrotoxins - Strict I/Os - Foley   #VTE ppx: Heparin  subQ TID  #Acute Metabolic Encephalopathy: likely due to septic shock, uremia etc... CT head negative for acute pathology Chronic opiate use, some concern for withdrawal  - Using Dilaudid  drip, goal RASS -2 to -3 since paralytic being considered -Versed  for breakthrough - Oxycodone  10 every 6  Disposition: ICU appropriate for vent support and vasopressors Goals of care -discussed with patient and daughter when she was off vent,, she would not want tracheostomy but all other forms of resuscitation requested   Labs   CBC: Recent Labs  Lab 01/10/24 1333 01/11/24 0303 01/13/24 0530 01/14/24 0600 01/15/24 0446  WBC 4.3 11.1* 10.2 19.6* 36.9*  NEUTROABS 3.3  --  8.7* 17.3* 34.7*  HGB 10.9* 9.6* 7.9* 10.6* 10.8*  HCT 34.2* 30.7* 25.2* 34.0* 36.1  MCV 84.0 83.9 83.2 84.8 88.0  PLT 342 262 196 242 226    Basic Metabolic Panel: Recent Labs  Lab 01/11/24 0303 01/11/24 1744 01/12/24 0540  01/13/24 0530 01/14/24 0600 01/14/24 1745 01/15/24 0446  NA 132* 136 134* 138 142 142 140  K 4.1 3.7 3.7 3.5 4.0 4.7 4.9  CL 99 102 101 106 111 110 109  CO2 18* 20* 20* 23 23 23 23   GLUCOSE 127* 124* 111* 140* 112* 152* 209*  BUN 46* 45* 43* 41* 36* 39* 46*  CREATININE 2.61* 1.74* 1.35* 1.02* 0.79 0.92 1.21*  CALCIUM  8.8* 9.1 9.2 9.3 9.7 9.6 9.3  MG 2.0 2.0  --  2.3 2.5*  --  2.6*  PHOS 7.6* 6.0*  --  3.8 2.6  --  4.7*   GFR: Estimated Creatinine Clearance: 32 mL/min (A) (by C-G formula based on SCr of 1.21 mg/dL (H)). Recent Labs  Lab 01/10/24 0843 01/10/24 0940 01/10/24 1333 01/11/24 0303 01/13/24 0530 01/14/24 0250 01/14/24 0559 01/14/24 0600 01/15/24 0446  WBC  --   --    < > 11.1* 10.2  --   --  19.6* 36.9*  LATICACIDVEN  0.9 2.2*  --   --   --  1.3 1.6  --   --    < > = values in this interval not displayed.    Liver Function Tests: Recent Labs  Lab 01/10/24 0450 01/10/24 1333 01/11/24 0303  AST 172* 155* 101*  ALT 43 43 37  ALKPHOS 64 56 54  BILITOT 0.3 0.3 0.3  PROT 5.8* 5.1* 5.2*  ALBUMIN  3.3* 2.6* 2.5*   Recent Labs  Lab 01/10/24 0450  LIPASE 13   No results for input(s): AMMONIA in the last 168 hours.  ABG    Component Value Date/Time   PHART 7.25 (L) 01/14/2024 1600   PCO2ART 51 (H) 01/14/2024 1600   PO2ART 79 (L) 01/14/2024 1600   HCO3 22.0 01/14/2024 1600   TCO2 21 (L) 01/03/2024 1336   ACIDBASEDEF 5.1 (H) 01/14/2024 1600   O2SAT 94 01/14/2024 1600     Coagulation Profile: Recent Labs  Lab 01/10/24 0939  INR 0.9    Cardiac Enzymes: No results for input(s): CKTOTAL, CKMB, CKMBINDEX, TROPONINI in the last 168 hours.  HbA1C: No results found for: HGBA1C  CBG: Recent Labs  Lab 01/14/24 1626 01/14/24 1948 01/14/24 2326 01/15/24 0341 01/15/24 0801  GLUCAP 106* 92 100* 103* 110*     My independent critical care time was 36 minutes  Harden Staff MD. FCCP. [facility] Pager : [phone]  If no response to pager , please call [phone] until 7 pm After 7:00 pm call Elink  [phone]   01/15/2024

## 2024-01-15 NOTE — Procedures (Signed)
 Insertion of Chest Tube Procedure Note  MACIEL KEGG  [id number]  02-Oct-1960  Date:01/15/24  Time:5:02 PM    Provider Performing: Harden GAILS. Rebacca Votaw   Procedure: Pleural Catheter Insertion w/ Imaging Guidance (67442)  Indication(s) Effusion  Consent Risks of the procedure as well as the alternatives and risks of each were explained to the patient and/or caregiver.  Consent for the procedure was obtained and is signed in the bedside chart  Anesthesia Topical only with 1% lidocaine     Time Out Verified patient identification, verified procedure, site/side was marked, verified correct patient position, special equipment/implants available, medications/allergies/relevant history reviewed, required imaging and test results available.   Sterile Technique Maximal sterile technique including full sterile barrier drape, hand hygiene, sterile gown, sterile gloves, mask, hair covering, sterile ultrasound probe cover (if used).   Procedure Description Ultrasound used to identify appropriate pleural anatomy for placement and overlying skin marked. Area of placement cleaned and draped in sterile fashion.  A 14 French pigtail pleural catheter was placed into the right pleural space using Seldinger technique. Appropriate return of pus was obtained.  The tube was connected to atrium and placed on -20 cm H2O wall suction. See pic in media  Complications/Tolerance None; patient tolerated the procedure well. Chest X-ray is ordered to verify placement.   EBL Minimal  Specimen(s) none  Arlyn Buerkle V. Jude MD

## 2024-01-15 NOTE — Progress Notes (Signed)
 RT transported pt to CT on vent and back to ICU with no complications.

## 2024-01-15 NOTE — Plan of Care (Signed)
   Problem: Nutrition: Goal: Adequate nutrition will be maintained Outcome: Progressing   Problem: Elimination: Goal: Will not experience complications related to bowel motility Outcome: Progressing   Problem: Safety: Goal: Ability to remain free from injury will improve Outcome: Progressing

## 2024-01-15 NOTE — Progress Notes (Addendum)
 eLink Physician-Brief Progress Note Patient Name: Renee Gutierrez DOB: 08-04-1960 MRN: [id number]   Date of Service  01/15/2024  HPI/Events of Note  RN pointed out that no order in for tube feeds but have been hanging. Per MD and dietician note, plan to continue so I placed the order  eICU Interventions  Order placed - trickle feeds per MD note      Intervention Category Major Interventions: Respiratory failure - evaluation and management  Blane Worthington G Eisa Necaise 01/15/2024, 9:24 PM  1045 pm - Notified that patient had gone into A fib with RVR with rates in 150s. Seen on camera. Had flipped to SR in 90s when I saw her. RN notes she only had brief run of A fib which was not captured on EKG. Chest tube had initially drained around 1 liter fluid but most recently only around 60 cc. O2 sat is 100 while on 100% fio2. I was ordering some labs when patient went back into A fib with RVR. Qtc ok on EKG. Is on minimal vaso dose with Sbp in 140s. Will trial lopressor  . Check BMP mag and ABG. Ordering low dose lopressor  and will repeat if needed. DW RN  1230 am - Labs back. K and mag are ok. Po2 is 200. Coming down on Fio2 to keep O2 sat > 90. Total 10 mg lopressor  has been given in 3 doses (2.5, 2.5 and then 5 mg). Has not had sustained effect. BP has been in 130s. Minimal vaso is on. Will try a dilt push. If this does not work either then see no choice but to use amiodarone. A fib is new onset so not rushing with anticoagulation just as yet. Dw RN  2 am - Diltiazen ineffective. Add amio bolus and drip.   6 am - improved rates while on amio. Fio2 down to 60. Will repeat ABG this AM

## 2024-01-16 ENCOUNTER — Inpatient Hospital Stay (HOSPITAL_COMMUNITY)

## 2024-01-16 DIAGNOSIS — J9601 Acute respiratory failure with hypoxia: Secondary | ICD-10-CM | POA: Diagnosis not present

## 2024-01-16 DIAGNOSIS — E441 Mild protein-calorie malnutrition: Secondary | ICD-10-CM | POA: Diagnosis not present

## 2024-01-16 DIAGNOSIS — A419 Sepsis, unspecified organism: Secondary | ICD-10-CM | POA: Diagnosis not present

## 2024-01-16 DIAGNOSIS — R6521 Severe sepsis with septic shock: Secondary | ICD-10-CM | POA: Diagnosis not present

## 2024-01-16 LAB — CBC
HCT: 28.9 % — ABNORMAL LOW (ref 36.0–46.0)
Hemoglobin: 8.8 g/dL — ABNORMAL LOW (ref 12.0–15.0)
MCH: 26.5 pg (ref 26.0–34.0)
MCHC: 30.4 g/dL (ref 30.0–36.0)
MCV: 87 fL (ref 80.0–100.0)
Platelets: 118 K/uL — ABNORMAL LOW (ref 150–400)
RBC: 3.32 MIL/uL — ABNORMAL LOW (ref 3.87–5.11)
RDW: 17.5 % — ABNORMAL HIGH (ref 11.5–15.5)
WBC: 21.3 K/uL — ABNORMAL HIGH (ref 4.0–10.5)
nRBC: 0.2 % (ref 0.0–0.2)

## 2024-01-16 LAB — BASIC METABOLIC PANEL WITH GFR
Anion gap: 11 (ref 5–15)
BUN: 66 mg/dL — ABNORMAL HIGH (ref 8–23)
CO2: 23 mmol/L (ref 22–32)
Calcium: 9.5 mg/dL (ref 8.9–10.3)
Chloride: 109 mmol/L (ref 98–111)
Creatinine, Ser: 1.28 mg/dL — ABNORMAL HIGH (ref 0.44–1.00)
GFR, Estimated: 47 mL/min — ABNORMAL LOW (ref >60)
Glucose, Bld: 148 mg/dL — ABNORMAL HIGH (ref 70–99)
Potassium: 4.3 mmol/L (ref 3.5–5.1)
Sodium: 142 mmol/L (ref 135–145)

## 2024-01-16 LAB — GLUCOSE, CAPILLARY
Glucose-Capillary: 104 mg/dL — ABNORMAL HIGH (ref 70–99)
Glucose-Capillary: 105 mg/dL — ABNORMAL HIGH (ref 70–99)
Glucose-Capillary: 124 mg/dL — ABNORMAL HIGH (ref 70–99)
Glucose-Capillary: 131 mg/dL — ABNORMAL HIGH (ref 70–99)
Glucose-Capillary: 81 mg/dL (ref 70–99)
Glucose-Capillary: 98 mg/dL (ref 70–99)

## 2024-01-16 LAB — BLOOD GAS, ARTERIAL
Acid-base deficit: 3.5 mmol/L — ABNORMAL HIGH (ref 0.0–2.0)
Bicarbonate: 23.8 mmol/L (ref 20.0–28.0)
Drawn by: 331471
FIO2: 60 %
MECHVT: 340 mL
O2 Saturation: 99.4 %
PEEP: 5 cmH2O
Patient temperature: 36.9
RATE: 24 {breaths}/min
pCO2 arterial: 53 mmHg — ABNORMAL HIGH (ref 32–48)
pH, Arterial: 7.26 — ABNORMAL LOW (ref 7.35–7.45)
pO2, Arterial: 118 mmHg — ABNORMAL HIGH (ref 83–108)

## 2024-01-16 LAB — PHOSPHORUS: Phosphorus: 3.9 mg/dL (ref 2.5–4.6)

## 2024-01-16 LAB — MAGNESIUM: Magnesium: 2.5 mg/dL — ABNORMAL HIGH (ref 1.7–2.4)

## 2024-01-16 MED ORDER — METOPROLOL TARTRATE 5 MG/5ML IV SOLN
5.0000 mg | INTRAVENOUS | Status: DC | PRN
  Administered 2024-01-16 – 2024-01-17 (×2): 5 mg via INTRAVENOUS
  Filled 2024-01-16 (×2): qty 5

## 2024-01-16 MED ORDER — AMIODARONE HCL IN DEXTROSE 360-4.14 MG/200ML-% IV SOLN
30.0000 mg/h | INTRAVENOUS | Status: DC
  Administered 2024-01-16 – 2024-01-17 (×4): 30 mg/h via INTRAVENOUS
  Filled 2024-01-16 (×5): qty 200

## 2024-01-16 MED ORDER — ORAL CARE MOUTH RINSE: 15.0000 mL | OROMUCOSAL | Status: DC | PRN

## 2024-01-16 MED ORDER — HYDRALAZINE HCL 20 MG/ML IJ SOLN
10.0000 mg | Freq: Four times a day (QID) | INTRAMUSCULAR | Status: DC | PRN
  Administered 2024-01-16: 10 mg via INTRAVENOUS
  Filled 2024-01-16: qty 1

## 2024-01-16 MED ORDER — PHENYTOIN SODIUM 50 MG/ML IJ SOLN
100.0000 mg | Freq: Two times a day (BID) | INTRAMUSCULAR | Status: DC
  Administered 2024-01-16 – 2024-01-20 (×9): 100 mg via INTRAVENOUS
  Filled 2024-01-16 (×9): qty 2

## 2024-01-16 MED ORDER — AMIODARONE HCL IN DEXTROSE 360-4.14 MG/200ML-% IV SOLN
60.0000 mg/h | INTRAVENOUS | Status: AC
  Administered 2024-01-16 (×2): 60 mg/h via INTRAVENOUS
  Filled 2024-01-16: qty 200

## 2024-01-16 MED ORDER — METRONIDAZOLE 500 MG/100ML IV SOLN
500.0000 mg | Freq: Two times a day (BID) | INTRAVENOUS | Status: DC
  Administered 2024-01-16 – 2024-01-20 (×9): 500 mg via INTRAVENOUS
  Filled 2024-01-16 (×9): qty 100

## 2024-01-16 MED ORDER — AMIODARONE LOAD VIA INFUSION
150.0000 mg | Freq: Once | INTRAVENOUS | Status: AC
  Administered 2024-01-16: 150 mg via INTRAVENOUS
  Filled 2024-01-16: qty 83.34

## 2024-01-16 MED ORDER — IOHEXOL 300 MG/ML  SOLN
30.0000 mL | Freq: Once | INTRAMUSCULAR | Status: AC | PRN
  Administered 2024-01-16: 30 mL

## 2024-01-16 MED ORDER — DILTIAZEM HCL 25 MG/5ML IV SOLN
10.0000 mg | Freq: Once | INTRAVENOUS | Status: AC
  Administered 2024-01-16: 10 mg via INTRAVENOUS
  Filled 2024-01-16: qty 5

## 2024-01-16 MED ORDER — ORAL CARE MOUTH RINSE
15.0000 mL | OROMUCOSAL | Status: DC
  Administered 2024-01-16 – 2024-01-21 (×51): 15 mL via OROMUCOSAL

## 2024-01-16 MED ORDER — PANTOPRAZOLE SODIUM 40 MG IV SOLR
40.0000 mg | Freq: Two times a day (BID) | INTRAVENOUS | Status: DC
  Administered 2024-01-16 – 2024-01-20 (×8): 40 mg via INTRAVENOUS
  Filled 2024-01-16 (×8): qty 10

## 2024-01-16 MED ORDER — BISACODYL 10 MG RE SUPP: 10.0000 mg | Freq: Every day | RECTAL | Status: DC | PRN

## 2024-01-16 NOTE — Progress Notes (Signed)
 Nutrition Follow-up  DOCUMENTATION CODES:   Non-severe (moderate) malnutrition in context of chronic illness  INTERVENTION:  - Concern for esophageal perforation, holding TF at this time.   - If no esophageal perforation and able to restart tube feeds, recommend restarting trickles: Vital 1.5 @ 37mL/hr  - If found to have esophageal perforation and unable to start enteral nutrition, recommend initiation of TPN.   - Monitor magnesium , potassium, and phosphorus daily for at least 3 days, MD to replete as needed, as pt is at risk for refeeding syndrome.  NUTRITION DIAGNOSIS:   Moderate Malnutrition related to chronic illness as evidenced by moderate fat depletion, severe muscle depletion. *ongoing  GOAL:   Patient will meet greater than or equal to 90% of their needs *not met  MONITOR:   Vent status, Labs, Weight trends, TF tolerance  REASON FOR ASSESSMENT:   Consult Enteral/tube feeding initiation and management (Trickle TF)  ASSESSMENT:   63 y/o F with a PMH significant for HTN, fibromyalgia, GERD, emphysema who presented for severe chest pain, somnolence, and hypoxia found to have RLL pneumonia and right secondary spontaneous pneumothorax. Reportedly has been having recurrent admits for intractable N/V over the past 2 months.  11/22 Admit; Intubated 11/24 Trickle TF initiated 11/25 Extubated, Soft diet 11/26 Re-intubated; Trickle tube feeds started but later discontinued 11/28 Concern for esophageal perfoation  Per discussion with CCM today, concern for esophageal perforation. CCM discussed with GI, T CTS and radiology and plan for CT esophagram after pulling NGT back to mid esophagus and placing water -soluble contrast.   Will likely need TPN if found to have perforation. Patient remains at high risk of refeeding syndrome.   Meds now changed to IV. Patient now off pressors.     Admit weight: 95# Current weight: 106# I&O's: +2.8L since admit + for non-pitting  RUE/LUE and RLE/LLE edema   Medications reviewed and include: Protonix , Dilantin  BID,  Dilaudid    Labs reviewed:  Creatinine 1.28   Diet Order:   Diet Order             Diet NPO time specified  Diet effective now                   EDUCATION NEEDS:  Not appropriate for education at this time  Skin:  Skin Assessment: Reviewed RN Assessment  Last BM:  11/27  Height:  Ht Readings from Last 1 Encounters:  01/14/24 4' 11 (1.499 m)   Weight:  Wt Readings from Last 1 Encounters:  01/16/24 48.4 kg    BMI:  Body mass index is 21.55 kg/m.  Estimated Nutritional Needs:  Kcal:  1400-1550 kcals Protein:  75-85 grams Fluid:  >/= 1.5L    Trude Ned RD, LDN Contact via Secure Chat.

## 2024-01-16 NOTE — Progress Notes (Signed)
 Daughter updated regarding CT results and she will relay this information to patient's spouse.  Simrat Kendrick D. [facility], NP-C [facility] Personal contact information can be found on Amion  If no contact or response made please call [phone] 01/16/2024, 7:00 PM

## 2024-01-16 NOTE — Progress Notes (Signed)
 Patients NG tube was retracted by 20cm per MD order. Chest Xray was done and MD instructed to retract another 10cm. MD reviewed radiology and approved placement. NG tube is currently marked at 34cm.   Renee Gutierrez

## 2024-01-16 NOTE — Progress Notes (Addendum)
 Brief PCCM progress note  Notified by radiologist that patient's esophagram/CT post oral contrast confirms contrast extravasation into the right pleural space confirming small esophageal or hiatal hernia tear/leak with signs of aspiration into the right lower lobe bronchi.   Given confirmation of leak continue strict n.p.o. status, maintain patent chest tube, and advance small bore feeding tube back into stomach  Called spouse for update but no answer, voicemail left will try again shortly.  Additional 15 mins critical care time  Dakarai Mcglocklin D. [facility], NP-C [facility] Personal contact information can be found on Amion  If no contact or response made please call [phone] 01/16/2024, 6:44 PM

## 2024-01-16 NOTE — Progress Notes (Signed)
 NAME:  Renee Gutierrez, MRN:  [id number], DOB:  [birth year], LOS: 6 ADMISSION DATE:  01/10/2024, CONSULTATION DATE:  @TODAY @ REFERRING MD:  [facility], CHIEF COMPLAINT:  acute hypoxic respiratory failure   History of Present Illness:  Renee Gutierrez is a 63 y/o F with a PMH significant for HTN, fibromyalgia, GERD, emphysema who presented to [facility] ED for severe chest pain, somnolence, and hypoxia found to have RLL pneumonia, R secondary spontaneous pneumothorax, and concern for opiate toxidrome with ED course c/b worsening encephalopathy requiring invasive mechanical ventilation transferred to [facility] for CCM evaluation.  Per ED notes, the patient presented with somnolence, hypoxia, hypotension. Initial concern for opiate toxidrome. O2 SATS 69%. She was given small dose of narcan  with decent response, more alert and breathing, O2 sats improved. Patient in more pain. CTA chest performed and showed RLL consolidation, R pleural effusion, and pneumothorax. A R pigtail chest tube was placed. The patient initially required NE and request was made to transfer patient to ICU at [facility] or [facility]. She was also placed on a narcan  drip for opiate toxidrome.  On 11/22 AM, ED provider switched and patient had a respiratory decompensation, increasingly agitated, pulling on O2 and Ivs, more hypoxic. Decision was made to intubate patient and a femoral central line was placed. The patient was slated to come to [facility] for ICU level care.  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 and presence of a nonbleeding gastric ulcer measuring 2 cm and with a nonbleeding visible vessel which was ablated with bipolar probe  11/21 --> presented to [facility] ED, opiate toxidrome, narcan  drip, CTA chest showed R pneumo, chest tube placed 11/21 --> more altered, agitated, hypoxia, intubated, CVC placed, transferred to [facility] 11/23 lytics x 1 11/24 lytics x 2 11/25 extubated but reintubated by evening due to pigtail blockage & RT pneumo, flushed pigtail and removal of fibrinous clot 11/26 lytics #3 11/27 RT fem A -line >>, RT pigtail replaced  Interim History / Subjective:   Critically ill, intubated Right pigtail replaced yesterday with 1.7 L of purulent fluid Off pressors Afebrile Developed A-fib RVR overnight , failed Lopressor  and Cardizem, Amio drip added  Lines/Tubes: R pigtail CT, ETT, NGT, R Fem CVC, PIV, Foley  Objective    Blood pressure (!) 165/68, pulse 96, temperature 98.6 F (37 C), resp. rate 16, height 4' 11 (1.499 m), weight 48.4 kg, SpO2 100%.    Vent Mode: PRVC FiO2 (%):  [50 %-  100 %] 50 % Set Rate:  [24 bmp] 24 bmp Vt Set:  [340 mL-400 mL] 340 mL PEEP:  [5 cmH20-8 cmH20] 5 cmH20 Plateau Pressure:  [14 cmH20-27 cmH20] 16 cmH20   Intake/Output Summary (Last 24 hours) at 01/16/2024 0957 Last data filed at 01/16/2024 9062 Gross per 24 hour  Intake 2179.36 ml  Output 2280 ml  Net -100.64 ml   Filed Weights   01/14/24 0500 01/15/24 0449 01/16/24 0500  Weight: 47.2 kg 48.1 kg 48.4 kg     Examination: General: chronically ill appearing, intubated, sedate Eyes: hazy left pupil opacity, anisocoria, right 2 mm reactive to light ENT: intubated Skin: warm, intact, no rashes Neck: JVD flat CV: S1-S2 irregular, tacky Resp: Bilateral ventilated breath sounds, decreased on right, chest tube draining clear fluid Abdom: soft, nontender, nondistended, no hepatosplenomegaly Neuro: RASS -2 on Dilaudid  drip  Labs show slight decrease in leukocytosis, normal electrolytes, BUN/creatinine 66/1.3, worsening anemia and thrombocytopenia Chest x-ray 11/27 shows resolved right pneumothorax, right effusion decreased after placement of right pigtail, bilateral interstitial infiltrates  Resolved problem list   #High anion gap metabolic acidosis    Assessment and Plan    #Acute Hypoxic Respiratory Failure: likely due to aspiration, empyema, R pneumothorax. Reintubated 11/25 due to right pneumothorax/pigtail blockage - Low tidal volume ventilation - Lowest FiO2 to keep SPO2 > 90% and PaO2 > 65 mmHg , drop PEEP to 5 - Daily SAT/SBT as tolerated  - Triple therapy nebs - Tracheobronchial toilet   #Secondary Spontaneous Pneumothorax: Recurrent empyema status post lytics x3, pigtail replaced 11/27 , suspected esophageal perforation - Daily CXR - CT to - 20 suction - flush pigtail every shift - Discussed with GI and T CTS.  Repeat EGD would be high risk.  Best diagnostic study for suspected distal esophageal perforation would be to obtain CT esophagram after pulling NG tube back to mid esophagus and placing water -soluble contrast  # Septic shock:  due to fungemia # Aspiration pneumonia #Empyema -strep mitis and Candida glabrata - Off pressors currently - Continue Ceftriaxone  , add Flagyl  - Continue Micafungin  - dc Steroids   # Mild protein calorie nutrition:  Intractable nausea and vomiting with recurrent hospital admissions -seen by GI  11/18 and plan was to follow-up with  general surgery/hernia specialist as outpatient at  [facility] for paraesophageal hernia Last EGD 11/17 showed Nissen from duplication wrap intact, 3 cm paraesophageal hernia, nonbleeding gastric ulcer with clean base  -Hold tube feeds, changed to oral meds to IV while evaluating esophagus -May need TPN - PPI  #AKI: likely pre-renal due to shock state; resolving again - Avoid Nephrotoxins - Strict I/Os - Foley   #VTE ppx: Heparin  subQ TID  #Acute Metabolic Encephalopathy: likely due to septic shock, uremia etc... CT head negative for acute pathology Chronic opiate use, some concern for withdrawal  - Using Dilaudid  drip, goal RASS -2 to -3 since paralytic being considered -Versed  for breakthrough - DC oxycodone   Disposition: ICU appropriate for vent support and vasopressors Goals of care -discussed with patient and daughter when she was off vent,, she would not want tracheostomy but all other forms of resuscitation requested , updated daughter on a daily basis   Labs   CBC: Recent Labs  Lab 01/10/24 1333 01/11/24 0303 01/13/24 0530 01/14/24 0600 01/15/24 0446 01/16/24 0412  WBC 4.3 11.1* 10.2 19.6* 36.9* 21.3*  NEUTROABS 3.3  --  8.7* 17.3* 34.7*  --   HGB 10.9* 9.6* 7.9* 10.6* 10.8* 8.8*  HCT  34.2* 30.7* 25.2* 34.0* 36.1 28.9*  MCV 84.0 83.9 83.2 84.8 88.0 87.0  PLT 342 262 196 242 226 118*    Basic Metabolic Panel: Recent Labs  Lab 01/11/24 1744 01/12/24 0540 01/13/24 0530 01/14/24 0600 01/14/24 1745 01/15/24 0446 01/15/24 2250 01/16/24 0412  NA 136   < > 138 142 142 140 142 142  K 3.7   < > 3.5 4.0 4.7 4.9 4.3 4.3  CL 102   < > 106 111 110 109 109 109  CO2 20*   < > 23 23 23 23 23 23   GLUCOSE 124*   < > 140* 112* 152* 209* 119* 148*  BUN 45*   < > 41* 36* 39* 46* 68* 66*  CREATININE 1.74*   < > 1.02* 0.79 0.92 1.21* 1.45* 1.28*  CALCIUM  9.1   < > 9.3 9.7 9.6 9.3 9.6 9.5  MG 2.0  --  2.3 2.5*  --  2.6* 2.6* 2.5*  PHOS 6.0*  --  3.8 2.6  --  4.7*  --   3.9   < > = values in this interval not displayed.   GFR: Estimated Creatinine Clearance: 30.3 mL/min (A) (by C-G formula based on SCr of 1.28 mg/dL (H)). Recent Labs  Lab 01/10/24 0843 01/10/24 0940 01/10/24 1333 01/13/24 0530 01/14/24 0250 01/14/24 0559 01/14/24 0600 01/15/24 0446 01/16/24 0412  WBC  --   --    < > 10.2  --   --  19.6* 36.9* 21.3*  LATICACIDVEN 0.9 2.2*  --   --  1.3 1.6  --   --   --    < > = values in this interval not displayed.    Liver Function Tests: Recent Labs  Lab 01/10/24 0450 01/10/24 1333 01/11/24 0303  AST 172* 155* 101*  ALT 43 43 37  ALKPHOS 64 56 54  BILITOT 0.3 0.3 0.3  PROT 5.8* 5.1* 5.2*  ALBUMIN  3.3* 2.6* 2.5*   Recent Labs  Lab 01/10/24 0450  LIPASE 13   No results for input(s): AMMONIA in the last 168 hours.  ABG    Component Value Date/Time   PHART 7.26 (L) 01/16/2024 0735   PCO2ART 53 (H) 01/16/2024 0735   PO2ART 118 (H) 01/16/2024 0735   HCO3 23.8 01/16/2024 0735   TCO2 21 (L) 01/03/2024 1336   ACIDBASEDEF 3.5 (H) 01/16/2024 0735   O2SAT 99.4 01/16/2024 0735     Coagulation Profile: Recent Labs  Lab 01/10/24 0939  INR 0.9    Cardiac Enzymes: No results for input(s): CKTOTAL, CKMB, CKMBINDEX, TROPONINI in the last 168 hours.  HbA1C: No results found for: HGBA1C  CBG: Recent Labs  Lab 01/15/24 1541 01/15/24 2041 01/15/24 2353 01/16/24 0418 01/16/24 0752  GLUCAP 123* 112* 124* 131* 124*     My independent critical care time was 50 minutes coordinating care with GI, TCTS and radiology  Harden Staff MD. FCCP. [facility] Pager : [phone]  If no response to pager , please call [phone] until 7 pm After 7:00 pm call Elink  [phone]   01/16/2024

## 2024-01-16 NOTE — Progress Notes (Signed)
 I have coordinated with GI, T CTS and radiology as to the best diagnostic study for suspected esophageal perforation in the setting of recurrent empyema and candidemia with fluctuating clinical course. Last EGD was 11/17. GI feels that repeat EGD would be high risk procedure. TCTS agrees that contrast imaging would be helpful CT versus fluoroscopy. Radiology is concerned that this is off protocol but all consultants agree that this is the least risky procedure forward.  I also discussed with daughter risks and benefits of this diagnostic test. I PAL discussion documented separately.  Total additional critical care time was 30 minutes  Renee Gutierrez V. Jude MD

## 2024-01-16 NOTE — TOC Progression Note (Addendum)
 Transition of Care [facility]) - Progression Note    Patient Details  Name: Renee Gutierrez MRN: [id number] Date of Birth: May 21, 1960  [facility] CM/SW Contact  Doneta Glenys DASEN, RN Phone Number: 01/16/2024, 11:56 AM  Clinical Narrative:    CM met with Elsie (spouse) and Will(son) at the bedside.Patient still on ventilator. Palliative consult placed. PT recommended SNF. No decision required at present. CM will continue to follow.   Expected Discharge Plan:  (TBD) Barriers to Discharge: Continued Medical Work up               Expected Discharge Plan and Services In-house Referral: NA Discharge Planning Services: CM Consult Post Acute Care Choice: Durable Medical Equipment Living arrangements for the past 2 months: Single Family Home                 DME Arranged: N/A DME Agency: NA       HH Arranged: NA HH Agency: NA         Social Drivers of Health (SDOH) Interventions SDOH Screenings   Food Insecurity: No Food Insecurity (01/04/2024)  Recent Concern: Food Insecurity - Food Insecurity Present (12/28/2023)  Housing: Unknown (01/11/2024)  Transportation Needs: Patient Unable To Answer (01/11/2024)  Utilities: Patient Unable To Answer (01/11/2024)  Tobacco Use: High Risk (01/11/2024)    Readmission Risk Interventions    01/12/2024   11:08 AM 01/04/2024    1:15 PM 12/29/2023    2:24 PM  Readmission Risk Prevention Plan  Transportation Screening Complete Complete Complete  Medication Review Oceanographer) Complete Complete Complete  PCP or Specialist appointment within 3-5 days of discharge Complete Not Complete   HRI or Home Care Consult Complete Complete Complete  SW Recovery Care/Counseling Consult Complete Complete Complete  Palliative Care Screening Not Applicable Not Applicable Not Applicable  Skilled Nursing Facility Not Applicable Not Applicable Not Applicable

## 2024-01-16 NOTE — Progress Notes (Signed)
 OT Cancellation Note  Patient Details Name: KATYRA TOMASSETTI MRN: [id number] DOB: [birth year]   Cancelled Treatment:    Reason Eval/Treat Not Completed: Medical issues which prohibited therapy. Patient with new onset a fib with RVR and respiratory insufficiency now on vent. Will hold for now and follow-up at later date as appropriate.   Delanna JINNY Lesches, OTR/L 01/16/2024, 9:23 AM

## 2024-01-16 NOTE — Consult Note (Signed)
 Brief Consult Note  **Note - Patient was not seen in person. Patient's imaging and chart reviewed and case was discussed with Dr. Jude**  Contacted by Dr. Jude regarding this 63 year old woman a history of Nissen fundoplication 20 years ago, who presented to the [facility] ER with severe chest pain and in respiratory distress, 5 days after an EGD for intractable nausea.  During that EGD, the patient had a biopsy of a 2 cm, cratered ulcer of the cardia of the stomach which was within the hernia sac.    She was also found to have a right sided empyema status post chest tube.  She also has fungemia.  The chest tube was removed at one point and the empyema rapidly reaccumulated, raising suspicion for some type of GI perforation.  Patient currently remains intubated on a hydromorphone  drip.  Currently off pressors.  Esophagram performed today via NG tube did not demonstrate any leak from the esophagus.  However, a subsequent CT scan does demonstrate extravasation of contrast into the right chest.  The contrast appears to come from alongside the Nissen fundoplication within the hiatal hernia suspicious for a gastric perforation and less likely an esophageal perforation.  This would be consistent with a lack of leak seen on the esophagram earlier today.  Not certain that there isn't an esophageal perforation, but history and recent instrumentation/biopsy of an ulcerated area of the stomach would be more likely the source of perforation.  Patient is not overall well and doesn't seem like she'd be a good candidate for any type of surgery.  Would recommend GI re-scope to evaluate for esophageal vs gastric perforation.  If possible, could stent esophagus or clip stomach if necessary.  If the perforation is in the stomach and unable to be clipped, could consult general surgery for evaluation but again, I'm not sure patient is really an operative candidate.  In the meantime, would recommend conservative management.  Need to  readvance the NGT into the stomach, keep chest tube draining, broad spectrum antibiotics and antifungals and strict NPO.  Plan discussed with primary team.  Con Clunes, MD Cardiothoracic Surgery Pager: [phone]

## 2024-01-16 NOTE — IPAL (Signed)
  Interdisciplinary Goals of Care Family Meeting   Date carried out:: 01/16/2024  Location of the meeting: Bedside  Member's involved: Physician, Bedside Registered Nurse, and Family Member or next of kin - daughter, son, husband  Durable Power of Pensions Consultant or environmental health practitioner: Husband    Discussion: We discussed goals of care for [employer] .  Anticipateprolonged course if esophageal perforation including permaanent tubes such as PEG, possible trach based on resp course , rehab, nutrition. She would not want permanent tubes or long term nursing facility but ok with temp measures. Family having a hard time making decisions for her - will involve palliative acre for family support. Agree that CPR would not be in her interst  Code status: Limited code, no CPR  Disposition: Continue current acute care   Time spent for the meeting: 16m  Vikki Gains V. Kenitra Leventhal 01/16/2024, 11:15 AM

## 2024-01-16 NOTE — Consult Note (Signed)
 Referring Provider: Dr. Jude Primary Care Physician:  Patient, No Pcp Per Primary Gastroenterologist:  Sampson  Reason for Consultation:  Evaluate for esophageal perforation  HPI: Renee Gutierrez is a 63 y.o. female called by Dr. Jude for consult to evaluate for possible esophageal perforation. Large ulcer in cardia in hernia sac seen on EGDs in [place] X 3 in the last 6 weeks with most recent being on 11/17 that showed a 2 cm ulcer. Ulcer had a nonbleeding visible vessel on 10/17 that was treated with bipolar probe with successful coagulation. History of Nissen fundoplication. Patient admitted from [facility] with respiratory failure and being treated for fungemia, empyema, and aspiration pneumonia. History obtained from chart review and daughter. Spoke to Dr. Jude this morning and advised him to get a water  soluble esophagram to look for esophageal leak. Limited views on esophagram but no esophageal perforation seen. Patient on ventilator and family in room.  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    Past Surgical History:  Procedure Laterality Date   ABDOMINAL HYSTERECTOMY     ABDOMINAL SURGERY     tumor removed   APPENDECTOMY     BRAIN SURGERY     to fix AVM   CEREBRAL ANEURYSM REPAIR     CERVICAL FUSION     CESAREAN SECTION     CHOLECYSTECTOMY     COLONOSCOPY N/A 12/31/2023   Procedure: COLONOSCOPY;  Surgeon: Shaaron Lamar HERO, MD;  [facility];  Service: Endoscopy;  Laterality: N/A;   ESOPHAGEAL DILATION N/A 11/28/2023    Procedure: DILATION, ESOPHAGUS;  Surgeon: Cindie Carlin POUR, DO;  Location: [facility];  Service: Endoscopy;  Laterality: N/A;   ESOPHAGOGASTRODUODENOSCOPY N/A 11/28/2023   Procedure: EGD (ESOPHAGOGASTRODUODENOSCOPY);  Surgeon: Cindie Carlin POUR, DO;  [facility]: [facility];  Service: Endoscopy;  Laterality: N/A;   ESOPHAGOGASTRODUODENOSCOPY N/A 12/05/2023   Procedure: EGD (ESOPHAGOGASTRODUODENOSCOPY);  Surgeon: Eartha Flavors, Toribio, MD;  [facility]: [facility];  Service: Gastroenterology;  Laterality: N/A;   ESOPHAGOGASTRODUODENOSCOPY N/A 01/05/2024   Procedure: EGD (ESOPHAGOGASTRODUODENOSCOPY);  Surgeon: Cindie Carlin POUR, DO;  Location: [facility];  Service: Endoscopy;  Laterality: N/A;   LUNG SURGERY     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    Prior to Admission medications   Medication Sig Start Date End Date Taking? Authorizing Provider  amLODipine  (NORVASC ) 10 MG tablet Take 1 tablet (10 mg total) by mouth daily. 12/15/23  Yes Arrien, Elidia Toribio, MD  DULoxetine  (CYMBALTA ) 60 MG capsule Take 60 mg by mouth 2 (two) times daily. 05/16/20  Yes [provider]  labetalol  (NORMODYNE ) 300 MG tablet Take 1 tablet (300 mg total) by mouth 2 (two) times daily. 01/01/24  Yes Tat, Alm, MD  lisinopril  (ZESTRIL ) 20 MG tablet Take 1 tablet (20 mg total) by mouth daily. 01/02/24  Yes Tat, Alm, MD  morphine  (MSIR) 15 MG tablet Take 15 mg  by mouth every 4 (four) hours as needed for severe pain (pain score 7-10).   Yes [provider]  ondansetron  (ZOFRAN -ODT) 8 MG disintegrating tablet Take 1 tablet (8 mg total) by mouth every 6 (six) hours as needed for nausea or vomiting. 11/29/23  Yes Darci Pore, MD  pantoprazole  (PROTONIX ) 40 MG tablet Take 1 tablet (40 mg total) by mouth 2 (two) times daily. 11/29/23  Yes Darci Pore, MD  phenytoin  (DILANTIN ) 100 MG ER capsule Take 1 capsule (100 mg total) by mouth 2 (two)  times daily. 01/06/24  Yes Arlon Carliss ORN, DO  prednisoLONE  acetate (PRED FORTE ) 1 % ophthalmic suspension Place 1 drop into the left eye in the morning and at bedtime. 12/14/23  Yes Arrien, Elidia Sieving, MD  pregabalin  (LYRICA ) 50 MG capsule Take 50 mg by mouth See admin instructions. Take 1 capsule by mouth every night at bedtime for 1 week then 1 capsule twice daily for 1 week then 1 capsule three times daily.   Yes [provider]  prochlorperazine  (COMPAZINE ) 10 MG tablet Take 1 tablet (10 mg total) by mouth every 6 (six) hours as needed for nausea or vomiting. 12/24/23  Yes Bryn Bernardino NOVAK, MD  rOPINIRole  (REQUIP ) 2 MG tablet Take 2 mg by mouth in the morning. 11/11/14  Yes [provider]  simethicone  (GAS RELIEF EXTRA STRENGTH) 125 MG chewable tablet Chew 125 mg by mouth daily as needed for flatulence.  05/24/14  Yes [provider]  spironolactone  (ALDACTONE ) 25 MG tablet Take 1 tablet (25 mg total) by mouth daily. 12/14/23  Yes Arrien, Elidia Sieving, MD  sucralfate  (CARAFATE ) 1 GM/10ML suspension Take 10 mLs (1 g total) by mouth 4 (four) times daily -  with meals and at bedtime. 12/07/23  Yes TatAlm, MD    Scheduled Meds:  arformoterol   15 mcg Nebulization BID   budesonide  (PULMICORT ) nebulizer solution  0.25 mg Nebulization BID   Chlorhexidine  Gluconate Cloth  6 each Topical Daily   heparin   5,000 Units Subcutaneous Q8H   nicotine   14 mg Transdermal Daily   mouth rinse  15 mL Mouth Rinse Q2H   pantoprazole  (PROTONIX ) IV  40 mg Intravenous QHS   phenytoin  (DILANTIN ) IV  100 mg Intravenous Q12H   prednisoLONE  acetate  1 drop Left Eye Daily   revefenacin   175 mcg Nebulization Daily   sodium chloride  flush  10 mL Intrapleural Q8H   sodium chloride  flush  10 mL Intrapleural Q8H   Continuous Infusions:  amiodarone 30 mg/hr (01/16/24 1300)   cefTRIAXone  (ROCEPHIN )  IV Stopped (01/16/24 0616)   HYDROmorphone  1 mg/hr (01/16/24 1300)   metronidazole   Stopped (01/16/24 1035)   micafungin  (MYCAMINE ) 100 mg in sodium chloride  0.9 % 100 mL IVPB Stopped (01/15/24 1639)   norepinephrine  (LEVOPHED ) Adult infusion Stopped (01/15/24 1724)   vasopressin  Stopped (01/16/24 0334)   PRN Meds:.acetaminophen  **OR** acetaminophen , alum & mag hydroxide-simeth, bisacodyl , HYDROmorphone , HYDROmorphone  (DILAUDID ) injection, ipratropium-albuterol , midazolam  PF, mouth rinse, phenol  Allergies as of 01/10/2024 - Review Complete 01/10/2024  Allergen Reaction Noted   Buprenorphine hcl Swelling 12/11/2016   Bupropion Swelling and Other (See Comments) 04/29/2017   Codeine Itching, Nausea And Vomiting, Nausea Only, and Swelling 12/03/2014   Sulfamethoxazole-trimethoprim Swelling and Other (See Comments) 04/05/2022   Nsaids Other (See Comments) 05/30/2020   Pregabalin  Swelling 12/06/2009   Sulfa antibiotics Other (See Comments) 05/30/2020   Tapentadol Nausea And Vomiting 04/17/2023   Clindamycin Other (See Comments) 08/25/2015   Clindamycin/lincomycin Rash and Other (  See Comments) 08/25/2015   Cymbalta  [duloxetine  hcl] Swelling 08/31/2011   Diclofenac Swelling 10/09/2010   Diclofenac sodium Swelling 10/09/2010   Gabapentin Swelling 12/06/2009   Oxycodone  Nausea Only 08/07/2021   Toradol  [ketorolac  tromethamine ] Rash 05/18/2016    Family History  Problem Relation Age of Onset   Throat cancer Mother    Bladder Cancer Father    Liver cancer Father    Colon cancer Neg Hx     Social History   Socioeconomic History   Marital status: Married    Spouse name: Not on file   Number of children: Not on file   Years of education: Not on file   Highest education level: Not on file  Occupational History   Not on file  Tobacco Use   Smoking status: Every Day    Types: Cigarettes   Smokeless tobacco: Never   Tobacco comments:    1 cigarette daily   Vaping Use   Vaping status: Never Used  Substance and Sexual Activity   Alcohol  use: No   Drug use: No    Sexual activity: Yes    Birth control/protection: Surgical  Other Topics Concern   Not on file  Social History Narrative   Not on file   Social Drivers of Health   Financial Resource Strain: Not on file  Food Insecurity: No Food Insecurity (01/04/2024)   Hunger Vital Sign    Worried About Running Out of Food in the Last Year: Never true    Ran Out of Food in the Last Year: Never true  Recent Concern: Food Insecurity - Food Insecurity Present (12/28/2023)   Hunger Vital Sign    Worried About Programme Researcher, Broadcasting/film/video in the Last Year: Sometimes true    Ran Out of Food in the Last Year: Sometimes true  Transportation Needs: Patient Unable To Answer (01/11/2024)   PRAPARE - Transportation    Lack of Transportation (Medical): Patient unable to answer    Lack of Transportation (Non-Medical): Patient unable to answer  Physical Activity: Not on file  Stress: Not on file  Social Connections: Not on file  Intimate Partner Violence: Patient Unable To Answer (01/11/2024)   Humiliation, Afraid, Rape, and Kick questionnaire    Fear of Current or Ex-Partner: Patient unable to answer    Emotionally Abused: Patient unable to answer    Physically Abused: Patient unable to answer    Sexually Abused: Patient unable to answer    Review of Systems: All negative except as stated above in HPI.  Physical Exam: Vital signs: Vitals:   01/16/24 0615 01/16/24 1000  BP:    Pulse: 96 (!) 105  Resp: 16 (!) 21  Temp: 98.6 F (37 C) 98.6 F (37 C)  SpO2: 100% 100%   Last BM Date : 01/15/24 General:  chronically ill-appearing, thin, elderly, ventilated HEENT: atraumatic, on ventilator Neck: supple, nontender Lungs:  Coarse breath sounds  Heart:  Regular rate and rhythm; no murmurs, clicks, rubs,  or gallops. Abdomen: soft, nontender (no grimace), nondistended, +BS  Rectal:  Deferred Ext: no edema  GI:  Lab Results: Recent Labs    01/14/24 0600 01/15/24 0446 01/16/24 0412  WBC 19.6* 36.9* 21.3*   HGB 10.6* 10.8* 8.8*  HCT 34.0* 36.1 28.9*  PLT 242 226 118*   BMET Recent Labs    01/15/24 0446 01/15/24 2250 01/16/24 0412  NA 140 142 142  K 4.9 4.3 4.3  CL 109 109 109  CO2 23 23 23   GLUCOSE 209*  119* 148*  BUN 46* 68* 66*  CREATININE 1.21* 1.45* 1.28*  CALCIUM  9.3 9.6 9.5   LFT No results for input(s): PROT, ALBUMIN , AST, ALT, ALKPHOS, BILITOT, BILIDIR, IBILI in the last 72 hours. PT/INR No results for input(s): LABPROT, INR in the last 72 hours.   Studies/Results: DG ESOPHAGUS W SINGLE CM (SOL OR THIN BA) Result Date: 01/16/2024 EXAM: SINGLE CONTRAST ESOPHAGRAM 01/16/2024 12:15:00 PM TECHNIQUE: Multiple single contrast images of the esophagus and gastroesophageal junction were obtained following the oral administration of water  soluble contrast. The patient is endotracheally intubated. Today's examination is performed through the nasogastric tube, the tip of which is in the mid esophagus. Approximately 35 cc of Omnipaque  300 was injected through the feeding tube into the esophagus. Much of this extended proximally towards the oral cavity, although some slowly percolated through the distal esophagus into the stomach. Various maneuvers including tilting the table up to 10 degrees and turning of the patient was performed to depict the distal esophagus. Some of the contrast was gently suctioned from the esophagus back through the nasogastric tube, and the nasogastric tube was flushed with water . FLUOROSCOPY DOSE AND TYPE: Radiation Dose Index: Reference Air Kerma (in mGy) = 20.1 COMPARISON: CT abdomen from 12/29/2023. CLINICAL HISTORY: Right pneumonia and pneumothorax, assessment for esophageal perforation. FINDINGS: The nasogastric tube tip is in the mid esophagus. Following injection of approximately 35 cc of Omnipaque  300 through the nasogastric tube, contrast extended proximally towards the oral cavity and slowly percolated through the distal esophagus into the  stomach. Contrast medium flows from the distal esophagus into the stomach with secondary and tertiary esophageal contractions noted. The appearance of curvilinear contrast extending in this region from the stomach into the lower chest on series 3 is compatible with the appearance of the fundoplication wrap, for example on the CT abdomen from 12/29/2023, and is not felt to be indicative of a leak. This fundoplication wrap only filled in a very minimal manner; there was no feasible way to encourage more filling. Overall no esophageal leak was demonstrated on today's exam. No stricture or obstruction. IMPRESSION: 1. No esophageal perforation demonstrated limited assessment. Electronically signed by: Ryan Salvage MD 01/16/2024 12:32 PM EST RP Workstation: [id number]    Impression/Plan: Septic shock with empyema and aspiration pneumonia in the setting of a large proximal stomach ulcer (in hernia sac) seen on EGDs recently in [place] called for a consult to evaluate for esophageal perforation. No perforation seen on limited esophagram. Patient going for chest CT to further evaluate. Would not recommend an EGD at this time due to risk of worsening an esophageal perforation if present. If signs of GI bleeding develops then may warrant emergent EGD to evaluate ulcer although angle will be very difficult to control bleeding if ulcer in hernia sac vs IR intervention if active bleeding from hernia sac vs surgery. Would increase to Protonix  40 mg IV Q 12 hours. [facility] will sign off. Dr. Burnette on call this weekend if additional questions arise.    LOS: 6 days   Jerrell JAYSON Sol  01/16/2024, 2:34 PM  Questions please call [phone]

## 2024-01-16 NOTE — Plan of Care (Signed)
  Problem: Clinical Measurements: Goal: Diagnostic test results will improve Outcome: Progressing Goal: Respiratory complications will improve Outcome: Progressing Goal: Cardiovascular complication will be avoided Outcome: Progressing   Problem: Nutrition: Goal: Adequate nutrition will be maintained Outcome: Progressing   Problem: Elimination: Goal: Will not experience complications related to bowel motility Outcome: Progressing Goal: Will not experience complications related to urinary retention Outcome: Progressing

## 2024-01-17 ENCOUNTER — Inpatient Hospital Stay (HOSPITAL_COMMUNITY)

## 2024-01-17 ENCOUNTER — Encounter (HOSPITAL_COMMUNITY): Payer: Self-pay | Admitting: Pulmonary Disease

## 2024-01-17 DIAGNOSIS — K9189 Other postprocedural complications and disorders of digestive system: Secondary | ICD-10-CM | POA: Diagnosis not present

## 2024-01-17 DIAGNOSIS — R579 Shock, unspecified: Secondary | ICD-10-CM | POA: Diagnosis not present

## 2024-01-17 DIAGNOSIS — Z7189 Other specified counseling: Secondary | ICD-10-CM | POA: Diagnosis not present

## 2024-01-17 DIAGNOSIS — B49 Unspecified mycosis: Secondary | ICD-10-CM

## 2024-01-17 DIAGNOSIS — Z515 Encounter for palliative care: Secondary | ICD-10-CM

## 2024-01-17 DIAGNOSIS — K255 Chronic or unspecified gastric ulcer with perforation: Secondary | ICD-10-CM | POA: Diagnosis present

## 2024-01-17 DIAGNOSIS — B952 Enterococcus as the cause of diseases classified elsewhere: Secondary | ICD-10-CM

## 2024-01-17 DIAGNOSIS — F17210 Nicotine dependence, cigarettes, uncomplicated: Secondary | ICD-10-CM

## 2024-01-17 DIAGNOSIS — B3789 Other sites of candidiasis: Secondary | ICD-10-CM

## 2024-01-17 DIAGNOSIS — R7881 Bacteremia: Secondary | ICD-10-CM

## 2024-01-17 DIAGNOSIS — E441 Mild protein-calorie malnutrition: Secondary | ICD-10-CM | POA: Diagnosis not present

## 2024-01-17 DIAGNOSIS — J9601 Acute respiratory failure with hypoxia: Secondary | ICD-10-CM | POA: Diagnosis not present

## 2024-01-17 DIAGNOSIS — A419 Sepsis, unspecified organism: Secondary | ICD-10-CM | POA: Diagnosis not present

## 2024-01-17 DIAGNOSIS — K5900 Constipation, unspecified: Secondary | ICD-10-CM | POA: Insufficient documentation

## 2024-01-17 DIAGNOSIS — R6521 Severe sepsis with septic shock: Secondary | ICD-10-CM | POA: Diagnosis not present

## 2024-01-17 DIAGNOSIS — J9383 Other pneumothorax: Secondary | ICD-10-CM | POA: Insufficient documentation

## 2024-01-17 DIAGNOSIS — R64 Cachexia: Secondary | ICD-10-CM | POA: Diagnosis present

## 2024-01-17 LAB — GLUCOSE, CAPILLARY
Glucose-Capillary: 110 mg/dL — ABNORMAL HIGH (ref 70–99)
Glucose-Capillary: 119 mg/dL — ABNORMAL HIGH (ref 70–99)
Glucose-Capillary: 131 mg/dL — ABNORMAL HIGH (ref 70–99)
Glucose-Capillary: 141 mg/dL — ABNORMAL HIGH (ref 70–99)
Glucose-Capillary: 136 mg/dL — ABNORMAL HIGH (ref 70–99)

## 2024-01-17 LAB — BLOOD CULTURE ID PANEL (REFLEXED) - BCID2
A.calcoaceticus-baumannii: NOT DETECTED
Bacteroides fragilis: NOT DETECTED
Candida albicans: NOT DETECTED
Candida auris: NOT DETECTED
Candida glabrata: NOT DETECTED
Candida krusei: NOT DETECTED
Candida parapsilosis: NOT DETECTED
Candida tropicalis: NOT DETECTED
Cryptococcus neoformans/gattii: NOT DETECTED
Enterobacter cloacae complex: NOT DETECTED
Enterobacterales: NOT DETECTED
Enterococcus Faecium: DETECTED — AB
Enterococcus faecalis: NOT DETECTED
Escherichia coli: NOT DETECTED
Haemophilus influenzae: NOT DETECTED
Klebsiella aerogenes: NOT DETECTED
Klebsiella oxytoca: NOT DETECTED
Klebsiella pneumoniae: NOT DETECTED
Listeria monocytogenes: NOT DETECTED
Neisseria meningitidis: NOT DETECTED
Proteus species: NOT DETECTED
Pseudomonas aeruginosa: NOT DETECTED
Salmonella species: NOT DETECTED
Serratia marcescens: NOT DETECTED
Staphylococcus aureus (BCID): NOT DETECTED
Staphylococcus epidermidis: NOT DETECTED
Staphylococcus lugdunensis: NOT DETECTED
Staphylococcus species: NOT DETECTED
Stenotrophomonas maltophilia: NOT DETECTED
Streptococcus agalactiae: NOT DETECTED
Streptococcus pneumoniae: NOT DETECTED
Streptococcus pyogenes: NOT DETECTED
Streptococcus species: NOT DETECTED
Vancomycin resistance: DETECTED — AB

## 2024-01-17 LAB — BASIC METABOLIC PANEL WITH GFR
Anion gap: 11 (ref 5–15)
BUN: 63 mg/dL — ABNORMAL HIGH (ref 8–23)
CO2: 24 mmol/L (ref 22–32)
Calcium: 9.9 mg/dL (ref 8.9–10.3)
Chloride: 112 mmol/L — ABNORMAL HIGH (ref 98–111)
Creatinine, Ser: 0.89 mg/dL (ref 0.44–1.00)
GFR, Estimated: >60 mL/min (ref >60)
Glucose, Bld: 117 mg/dL — ABNORMAL HIGH (ref 70–99)
Potassium: 4.1 mmol/L (ref 3.5–5.1)
Sodium: 147 mmol/L — ABNORMAL HIGH (ref 135–145)

## 2024-01-17 LAB — MAGNESIUM: Magnesium: 2.5 mg/dL — ABNORMAL HIGH (ref 1.7–2.4)

## 2024-01-17 LAB — PHOSPHORUS: Phosphorus: 4.2 mg/dL (ref 2.5–4.6)

## 2024-01-17 MED ORDER — DAPTOMYCIN-SODIUM CHLORIDE 500-0.9 MG/50ML-% IV SOLN
500.0000 mg | Freq: Every day | INTRAVENOUS | Status: DC
  Administered 2024-01-17 – 2024-01-19 (×3): 500 mg via INTRAVENOUS
  Filled 2024-01-17 (×4): qty 50

## 2024-01-17 MED ORDER — HYDRALAZINE HCL 20 MG/ML IJ SOLN
10.0000 mg | Freq: Four times a day (QID) | INTRAMUSCULAR | Status: DC | PRN
  Administered 2024-01-17: 10 mg via INTRAVENOUS
  Filled 2024-01-17 (×2): qty 1

## 2024-01-17 MED ORDER — DEXTROSE 5 % IV SOLN: INTRAVENOUS | Status: AC

## 2024-01-17 MED ORDER — DEXMEDETOMIDINE HCL IN NACL 400 MCG/100ML IV SOLN
0.0000 ug/kg/h | INTRAVENOUS | Status: DC
  Administered 2024-01-17: 0.9 ug/kg/h via INTRAVENOUS
  Administered 2024-01-17: 0.4 ug/kg/h via INTRAVENOUS
  Administered 2024-01-18: 1 ug/kg/h via INTRAVENOUS
  Filled 2024-01-17 (×3): qty 100

## 2024-01-17 NOTE — Consult Note (Addendum)
 WOC Nurse Consult Note: Reason for Consult: multiple deep tissue pressure injuries  Wound type: 1.  Deep tissue Pressure Injury spine purple maroon discoloration skin intact  2.  Deep Tissue Pressure Injury L buttock evolving to Stage 2 with lifting epidermis revealing red moist tissue  3.  Deep tissue Pressure Injury coccyx and medial left buttock purple maroon discoloration; left medial buttock appears to be developing necrotic tissue  4.  Right buttock deep tissue pressure injury purple maroon discoloration evolving to  red moist  5.  Right shoulder DTPI purple maroon discoloration Pressure Injury POA: no  Measurement: see nursing flowsheet  Wound bed: as above  Drainage (amount, consistency, odor) see nursing flowsheet  Periwound: erythema, skin tear to R shoulder  Dressing procedure/placement/frequency:  Cleanse coccyx/B buttocks wounds with Vashe, apply Xeroform gauze ([place] [phone]) to wound beds daily and secure with silicone foam or ABD pad and clothe tape.  Cleanse wounds to spine and R shoulder/scapula with Vashe, allow to air dry.  Apply Xeroform gauze ([place] [phone]) to wound beds daily and secure with silicone foam.   Patient should remain on a low air loss mattress if moved out of ICU setting to offload pressure.    POC discussed with bedside nurse. WOC team will follow every 7 to 10 days to assess wounds and change POC as needed. Deep Tissue Pressure Injuries are high risk to deteriorate.  Please reconsult for acute changes to wound beds.   Thank you,    Powell Bar MSN, RN-BC, [employer]

## 2024-01-17 NOTE — Plan of Care (Signed)
  Problem: Clinical Measurements: Goal: Diagnostic test results will improve Outcome: Progressing Goal: Cardiovascular complication will be avoided Outcome: Progressing   Problem: Elimination: Goal: Will not experience complications related to bowel motility Outcome: Progressing Goal: Will not experience complications related to urinary retention Outcome: Progressing   Problem: Pain Managment: Goal: General experience of comfort will improve and/or be controlled Outcome: Progressing   Problem: Clinical Measurements: Goal: Respiratory complications will improve Outcome: Not Progressing   Problem: Nutrition: Goal: Adequate nutrition will be maintained Outcome: Not Progressing

## 2024-01-17 NOTE — Progress Notes (Signed)
 eLink Physician-Brief Progress Note Patient Name: Renee Gutierrez DOB: 05/31/1960 MRN: [id number]   Date of Service  01/17/2024  HPI/Events of Note  Clarified orders:   eICU Interventions  1)Added BP parameters to PRN hydralazine  2) RASS goal -2, -3 3) Continue amio gtt for now. Day team can discontinue in am for trial off.     Intervention Category Minor Interventions: Communication with other healthcare providers and/or family  Zenith Lamphier Slater Staff 01/17/2024, 1:20 AM

## 2024-01-17 NOTE — Progress Notes (Signed)
 NAME:  Renee Gutierrez, MRN:  [id number], DOB:  Oct 23, 1960, LOS: 7 ADMISSION DATE:  01/10/2024, CONSULTATION DATE:  @TODAY @ REFERRING MD:  [facility], CHIEF COMPLAINT:  acute hypoxic respiratory failure   History of Present Illness:  Ms. Renee Gutierrez is a 63 y/o F with a PMH significant for HTN, fibromyalgia, GERD, emphysema who presented to [facility] ED for severe chest pain, somnolence, and hypoxia found to have RLL pneumonia, R secondary spontaneous pneumothorax, and concern for opiate toxidrome with ED course c/b worsening encephalopathy requiring invasive mechanical ventilation transferred to [facility] for CCM evaluation.  Per ED notes, the patient presented with somnolence, hypoxia, hypotension. Initial concern for opiate toxidrome. O2 SATS 69%. She was given small dose of narcan  with decent response, more alert and breathing, O2 sats improved. Patient in more pain. CTA chest performed and showed RLL consolidation, R pleural effusion, and pneumothorax. A R pigtail chest tube was placed. The patient initially required NE and request was made to transfer patient to ICU at [facility] or [facility]. She was also placed on a narcan  drip for opiate toxidrome.  On 11/22 AM, ED provider switched and patient had a respiratory decompensation, increasingly agitated, pulling on O2 and Ivs, more hypoxic. Decision was made to intubate patient and a femoral central line was placed. The patient was slated to come to [facility] for ICU level care.  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 and presence of a nonbleeding gastric ulcer measuring 2 cm and with a nonbleeding visible vessel which was ablated with bipolar probe  11/21 --> presented to [facility] ED, opiate toxidrome, narcan  drip, CTA chest showed R pneumo, chest tube placed 11/21 --> more altered, agitated, hypoxia, intubated, CVC placed, transferred to [facility] 11/23 lytics x 1 11/24 lytics x 2 11/25 extubated but reintubated by evening due to pigtail blockage & RT pneumo, flushed pigtail and removal of fibrinous clot 11/26 lytics #3 11/27 RT fem A -line >>, RT pigtail replaced>> 1.7 L of purulent fluid 11/28 Developed A-fib RVR overnight , failed Lopressor  and Cardizem, Amio drip added 11/28 esophagram negative but repeat CT chest after contrast shows extravasation into right chest, T CTS consulted  Interim History / Subjective:   Critically ill, intubated Surprisingly off pressors Remains on amiodarone drip Afebrile Pigtail 400 cc  Lines/Tubes: R pigtail CT, ETT, NGT, R Fem CVC, PIV, Foley  Objective    Blood pressure (!) 152/60, pulse 94, temperature 98.6 F (37 C), resp. rate ROLLEN)  21, height 4' 11 (1.499 m), weight 47.9 kg, SpO2 98%.    Vent Mode: PRVC FiO2 (%):  [40 %-100 %] 40 % Set Rate:  [24 bmp] 24 bmp Vt Set:  [340 mL] 340 mL PEEP:  [5 cmH20] 5 cmH20 Plateau Pressure:  [11 cmH20-17 cmH20] 11 cmH20   Intake/Output Summary (Last 24 hours) at 01/17/2024 9074 Last data filed at 01/17/2024 0900 Gross per 24 hour  Intake 1344.42 ml  Output 1435 ml  Net  -90.58 ml   Filed Weights   01/15/24 0449 01/16/24 0500 01/17/24 0456  Weight: 48.1 kg 48.4 kg 47.9 kg    Examination: General: chronically ill appearing, intubated, sedate Eyes: hazy left pupil opacity, anisocoria, right 2 mm reactive to light ENT: intubated Skin: warm, intact, no rashes Neck: JVD flat CV: S1-S2 regular, sinus rhythm on monitor Resp: Decreased breath sounds on right, some acidotic breathing, chest tube draining clear fluid Abdom: soft, nontender, nondistended, no hepatosplenomegaly Neuro: RASS -2 on Dilaudid  drip  Labs show mild hypernatremia, leukocytosis and thrombocytopenia Chest x-ray 11/29 shows decreased right effusion, minimal infiltrate in left  Resolved problem list   #High anion gap metabolic acidosis    Assessment and Plan   Imaging studies from 11/28 suggest perforation likely in the stomach part of the wrap, less likely from the esophagus , causing septic shock due to polymicrobial bacteremia/candidemia and right empyema  #Acute Hypoxic Respiratory Failure: likely due to aspiration, empyema, R pneumothorax. Reintubated 11/25 due to right pneumothorax/pigtail blockage - Low tidal volume ventilation - Lowest FiO2 to keep SPO2 > 90% and PaO2 > 65 mmHg , drop PEEP to 5 - Daily SAT/SBT as tolerated  - Triple therapy nebs - Tracheobronchial toilet   #Secondary Spontaneous Pneumothorax: Recurrent empyema status post lytics x3, pigtail replaced 11/27 , suspected esophageal/gastric perforation - Daily CXR - CT to - 20 suction - flush pigtail every shift -  GI and T CTS consulted, will obtain general surgery consultation  # Septic shock:  due to fungemia, VRE bacteremia noted # Aspiration pneumonia #Empyema -strep mitis and Candida glabrata - Added daptomycin 11/29 - Continue Ceftriaxone  +Flagyl  - Continue Micafungin  since 11/22 - dc Steroids   # Mild protein calorie nutrition:  Intractable nausea and vomiting with recurrent hospital  admissions -seen by GI  11/18 and plan was to follow-up with general surgery/hernia specialist as outpatient at  [facility] for paraesophageal hernia Last EGD 11/17 showed Nissen from duplication wrap intact, 3 cm paraesophageal hernia, nonbleeding gastric ulcer with clean base, biopsied  -Hold tube feeds, changed to oral meds to IV  -Will need TPN, hesitant to start until we can show that fungemia has cleared - PPI q 12h  #AKI: likely pre-renal due to shock state; resolved again - Avoid Nephrotoxins - Strict I/Os - Foley   #VTE ppx: Heparin  subQ TID  #Acute Metabolic Encephalopathy: likely due to septic shock, uremia etc... CT head negative for acute pathology Chronic opiate use, some concern for withdrawal  - Using Dilaudid  drip, goal RASS 0 to -1 since paralytic being considered -Versed  for breakthrough, add Precedex   Disposition: ICU appropriate for vent support and vasopressors Goals of care -see IPL note 11/28, no CPR issued, she would not want tracheostomy but all other forms of resuscitation requested , updating daughter on a daily basis   Labs   CBC: Recent Labs  Lab 01/10/24 1333 01/11/24 0303 01/13/24 0530 01/14/24 0600 01/15/24 0446 01/16/24 0412  WBC 4.3 11.1* 10.2 19.6* 36.9* 21.3*  NEUTROABS 3.3  --  8.7* 17.3* 34.7*  --   HGB 10.9* 9.6* 7.9* 10.6* 10.8* 8.8*  HCT 34.2* 30.7* 25.2* 34.0* 36.1 28.9*  MCV 84.0 83.9 83.2 84.8 88.0 87.0  PLT 342 262 196 242 226 118*    Basic Metabolic Panel: Recent Labs  Lab 01/13/24 0530 01/14/24 0600 01/14/24 1745 01/15/24 0446 01/15/24 2250 01/16/24 0412 01/17/24 0437  NA 138 142 142 140 142 142 147*  K 3.5 4.0 4.7 4.9 4.3 4.3 4.1  CL 106 111 110 109 109 109 112*  CO2 23 23 23 23 23 23 24   GLUCOSE 140* 112* 152* 209* 119* 148* 117*  BUN 41* 36* 39* 46* 68* 66* 63*  CREATININE 1.02* 0.79 0.92 1.21* 1.45* 1.28* 0.89  CALCIUM  9.3 9.7 9.6 9.3 9.6 9.5 9.9  MG 2.3 2.5*  --  2.6* 2.6* 2.5* 2.5*  PHOS 3.8 2.6  --   4.7*  --  3.9 4.2   GFR: Estimated Creatinine Clearance: 43.6 mL/min (by C-G formula based on SCr of 0.89 mg/dL). Recent Labs  Lab 01/10/24 0940 01/10/24 1333 01/13/24 0530 01/14/24 0250 01/14/24 0559 01/14/24 0600 01/15/24 0446 01/16/24 0412  WBC  --    < > 10.2  --   --  19.6* 36.9* 21.3*  LATICACIDVEN 2.2*  --   --  1.3 1.6  --   --   --    < > = values in this interval not displayed.    Liver Function Tests: Recent Labs  Lab 01/10/24 1333 01/11/24 0303  AST 155* 101*  ALT 43 37  ALKPHOS 56 54  BILITOT 0.3 0.3  PROT 5.1* 5.2*  ALBUMIN  2.6* 2.5*   No results for input(s): LIPASE, AMYLASE in the last 168 hours.  No results for input(s): AMMONIA in the last 168 hours.  ABG    Component Value Date/Time   PHART 7.26 (L) 01/16/2024 0735   PCO2ART 53 (H) 01/16/2024 0735   PO2ART 118 (H) 01/16/2024 0735   HCO3 23.8 01/16/2024 0735   TCO2 21 (L) 01/03/2024 1336   ACIDBASEDEF 3.5 (H) 01/16/2024 0735   O2SAT 99.4 01/16/2024 0735     Coagulation Profile: Recent Labs  Lab 01/10/24 0939  INR 0.9    Cardiac Enzymes: No results for input(s): CKTOTAL, CKMB, CKMBINDEX, TROPONINI in the last 168 hours.  HbA1C: No results found for: HGBA1C  CBG: Recent Labs  Lab 01/16/24 1540 01/16/24 2129 01/16/24 2343 01/17/24 0441 01/17/24 0809  GLUCAP 104* 81 105* 110* 119*     My independent critical care time was 45 m  Harden Staff MD. FCCP. [facility] Pager : [phone]  If no response to pager , please call [phone] until 7 pm After 7:00 pm call Elink  [phone]   01/17/2024

## 2024-01-17 NOTE — Progress Notes (Signed)
 INFECTIOUS DISEASE PROGRESS NOTE Date of Admission:  01/10/2024     ID: Renee Gutierrez is a 63 y.o. female with  sepsis  Principal Problem:   Incarcerated hiatal hernia with ulcer at cardia and perforation/leak Active Problems:   CKD (chronic kidney disease), stage II   Peptic ulcer within a sliding hiatal hernia   Septic shock (HCC)   Acute hypoxic respiratory failure (HCC)   Empyema (HCC)   Severe protein-calorie malnutrition   Chronic gastric ulcer (cardia) with perforation/leak   Cachexia   Severe chronic obstructive pulmonary disease (HCC)   Subjective:  11/29- Found on imaging to have esophageal leak into R chest. Seen by CT and gen surgery and GI and considering transfer to tertiary care facility  no fevers, wbc decreased last check. Remains on dapto, ctx and flagyl  as well as micafungin    BCX 11/27 1/2 with enterococcus faecium.  11/22 with candida glabrata  11/22 mrsa nares pcr negative 11/22 tracheal aspirate cx few normal respiratory flora -- no staph aureus or pseudomonas 11/22 pleural fluid - Cx strep mitis (R penicillin; S ceftriaxone , levoflox), strep parasanguinis (I pcn; S ceftriaxone ), candida glabrata (sent to [facility] for further testing)      ROS  Unable to obtain  Medications:  Antibiotics Given (last 72 hours)     Date/Time Action Medication Dose Rate   01/15/24 0514 New Bag/Given   cefTRIAXone  (ROCEPHIN ) 2 g in sodium chloride  0.9 % 100 mL IVPB 2 g 200 mL/hr   01/16/24 0546 New Bag/Given   cefTRIAXone  (ROCEPHIN ) 2 g in sodium chloride  0.9 % 100 mL IVPB 2 g 200 mL/hr   01/16/24 0935 New Bag/Given   metroNIDAZOLE  (FLAGYL ) IVPB 500 mg 500 mg 100 mL/hr   01/16/24 2106 New Bag/Given   metroNIDAZOLE  (FLAGYL ) IVPB 500 mg 500 mg 899 mL/hr   01/17/24 0533 New Bag/Given   DAPTOmycin (CUBICIN) IVPB 500 mg/30mL premix 500 mg 100 mL/hr   01/17/24 0608 New Bag/Given   cefTRIAXone  (ROCEPHIN ) 2 g in sodium chloride  0.9 % 100 mL IVPB 2 g 200 mL/hr    01/17/24 0902 New Bag/Given   metroNIDAZOLE  (FLAGYL ) IVPB 500 mg 500 mg 100 mL/hr       arformoterol   15 mcg Nebulization BID   budesonide  (PULMICORT ) nebulizer solution  0.25 mg Nebulization BID   Chlorhexidine  Gluconate Cloth  6 each Topical Daily   heparin   5,000 Units Subcutaneous Q8H   nicotine   14 mg Transdermal Daily   mouth rinse  15 mL Mouth Rinse Q2H   pantoprazole  (PROTONIX ) IV  40 mg Intravenous Q12H   phenytoin  (DILANTIN ) IV  100 mg Intravenous Q12H   prednisoLONE  acetate  1 drop Left Eye Daily   revefenacin   175 mcg Nebulization Daily   sodium chloride  flush  10 mL Intrapleural Q8H   sodium chloride  flush  10 mL Intrapleural Q8H    Objective: Vital signs in last 24 hours: Temp:  [98.4 F (36.9 C)-99.5 F (37.5 C)] 99 F (37.2 C) (11/29 0820) Pulse Rate:  [70-109] 91 (11/29 0820) Resp:  [13-31] 21 (11/29 0820) BP: (152-173)/(60-76) 152/60 (11/28 2000) SpO2:  [96 %-100 %] 98 % (11/29 0820) Arterial Line BP: (127-182)/(59-80) 171/79 (11/29 0600) FiO2 (%):  [40 %-100 %] 40 % (11/29 0820) Weight:  [47.9 kg] 47.9 kg (11/29 0456) Physical Exam  Constitutional:  Intubated, sedated HENT: NGT in place Mouth/Throat: ETT in place Cardiovascular: Normal rate, regular rhythm and normal heart sounds. Pulmonary/Chest: mech breath sounds R chest tube with serous  drainage Neck = supple, no nuchal rigidity Abdominal: Soft.  Lymphadenopathy: no cervical adenopathy. No axillary adenopathy Neurological: sedated R Fem triple line and A line  Lab Results Recent Labs    01/15/24 0446 01/15/24 2250 01/16/24 0412 01/17/24 0437  WBC 36.9*  --  21.3*  --   HGB 10.8*  --  8.8*  --   HCT 36.1  --  28.9*  --   NA 140   < > 142 147*  K 4.9   < > 4.3 4.1  CL 109   < > 109 112*  CO2 23   < > 23 24  BUN 46*   < > 66* 63*  CREATININE 1.21*   < > 1.28* 0.89   < > = values in this interval not displayed.    Microbiology: Results for orders placed or performed during the  hospital encounter of 01/10/24  Gram stain     Status: None   Collection Time: 01/10/24  7:21 AM   Specimen: Pleura  Result Value Ref Range Status   Specimen Description PLEURAL  Final   Special Requests NONE  Final   Gram Stain   Final    BUDDING YEAST SEEN FEW GRAM POSITIVE COCCI IN CHAINS MODERATE WBC PRESENT,BOTH PMN AND MONONUCLEAR CYTOSPIN SMEAR PLEURAL Gram Stain Report Called to,Read Back By and Verified With: BELCHER @ 1125 ON [id number] BY HENDERSON L Performed at [facility], [address], [place], [place] [address]    Report Status 01/10/2024 FINAL  Final  Culture, body fluid w Gram Stain-bottle     Status: Abnormal   Collection Time: 01/10/24  7:21 AM   Specimen: Pleura  Result Value Ref Range Status   Specimen Description   Final    PLEURAL BOTTLES DRAWN AEROBIC AND ANAEROBIC Performed at [facility], [address] [place], [place] [address]    Special Requests   Final    10CC Performed at [facility], [address] [place], [place] [address]    Gram Stain   Final    IN BOTH AEROBIC AND ANAEROBIC BOTTLES GRAM POSITIVE COCCI BUDDING YEAST SEEN WBC PRESENT,BOTH PMN AND MONONUCLEAR CYTOSPIN SMEAR Gram Stain Report Called to,Read Back By and Verified With: KENZIE R. ON 01/10/2024 @1 :58PM BY IVAR ECHEVARIA  Performed at [facility], [address], [place], [place] [address]    Culture STREPTOCOCCUS PARASANGUINIS CANDIDA GLABRATA  (A)  Final   Report Status 01/13/2024 FINAL  Final   Organism ID, Bacteria STREPTOCOCCUS PARASANGUINIS  Final      Susceptibility   Streptococcus parasanguinis - MIC*    PENICILLIN 1 INTERMEDIATE Intermediate     CEFTRIAXONE  1 SENSITIVE Sensitive     LEVOFLOXACIN 1 SENSITIVE Sensitive     VANCOMYCIN 0.5 SENSITIVE Sensitive     * STREPTOCOCCUS PARASANGUINIS  Culture, blood (Routine X 2) w Reflex to ID Panel     Status: Abnormal (Preliminary result)   Collection Time: 01/10/24  8:43 AM   Specimen: Blood  Result Value Ref Range Status    Specimen Description   Final    RT CENTRAL LINE BOTTLES DRAWN AEROBIC AND ANAEROBIC Performed at [facility], [address], [place], [place] [address]    Special Requests   Final    Blood Culture adequate volume Performed at [facility], [address], [place], [place] [address]    Culture  Setup Time (A)  Final    YEAST AEROBIC BOTTLE ONLY Gram Stain Report Called to,Read Back By and Verified  With: A. CONTRERAS @WL  [id number], VIRAY,J CRITICAL RESULT CALLED TO, READ BACK BY AND VERIFIED WITH: PHARMD E JACKSON 01/15/2024 @ 0107 BY AB    Culture (A)  Final    CANDIDA GLABRATA Sent to [facility] for further susceptibility testing. Performed at [facility], [address] [place], [place] [address]    Report Status PENDING  Incomplete  Culture, blood (Routine X 2) w Reflex to ID Panel     Status: None   Collection Time: 01/10/24  8:43 AM   Specimen: Blood  Result Value Ref Range Status   Specimen Description AEROBIC BOTTLE ONLY RT FEMORAL  Final   Special Requests   Final    Blood Culture results may not be optimal due to an inadequate volume of blood received in culture bottles   Culture   Final    NO GROWTH 5 DAYS Performed at [facility], [address], [place], [place] [address]    Report Status 01/15/2024 FINAL  Final  Blood Culture ID Panel (Reflexed)     Status: Abnormal   Collection Time: 01/10/24  8:43 AM  Result Value Ref Range Status   Enterococcus faecalis NOT DETECTED NOT DETECTED Final   Enterococcus Faecium NOT DETECTED NOT DETECTED Final   Listeria monocytogenes NOT DETECTED NOT DETECTED Final   Staphylococcus species NOT DETECTED NOT DETECTED Final   Staphylococcus aureus (BCID) NOT DETECTED NOT DETECTED Final   Staphylococcus epidermidis NOT DETECTED NOT DETECTED Final   Staphylococcus lugdunensis NOT DETECTED NOT DETECTED Final   Streptococcus species NOT DETECTED NOT DETECTED Final   Streptococcus agalactiae NOT DETECTED NOT DETECTED Final    Streptococcus pneumoniae NOT DETECTED NOT DETECTED Final   Streptococcus pyogenes NOT DETECTED NOT DETECTED Final   A.calcoaceticus-baumannii NOT DETECTED NOT DETECTED Final   Bacteroides fragilis NOT DETECTED NOT DETECTED Final   Enterobacterales NOT DETECTED NOT DETECTED Final   Enterobacter cloacae complex NOT DETECTED NOT DETECTED Final   Escherichia coli NOT DETECTED NOT DETECTED Final   Klebsiella aerogenes NOT DETECTED NOT DETECTED Final   Klebsiella oxytoca NOT DETECTED NOT DETECTED Final   Klebsiella pneumoniae NOT DETECTED NOT DETECTED Final   Proteus species NOT DETECTED NOT DETECTED Final   Salmonella species NOT DETECTED NOT DETECTED Final   Serratia marcescens NOT DETECTED NOT DETECTED Final   Haemophilus influenzae NOT DETECTED NOT DETECTED Final   Neisseria meningitidis NOT DETECTED NOT DETECTED Final   Pseudomonas aeruginosa NOT DETECTED NOT DETECTED Final   Stenotrophomonas maltophilia NOT DETECTED NOT DETECTED Final   Candida albicans NOT DETECTED NOT DETECTED Final   Candida auris NOT DETECTED NOT DETECTED Final   Candida glabrata DETECTED (A) NOT DETECTED Final    Comment: CRITICAL RESULT CALLED TO, READ BACK BY AND VERIFIED WITH: PHARMD E JACKSON 01/15/2024 @ 0107 BY AB    Candida krusei NOT DETECTED NOT DETECTED Final   Candida parapsilosis NOT DETECTED NOT DETECTED Final   Candida tropicalis NOT DETECTED NOT DETECTED Final   Cryptococcus neoformans/gattii NOT DETECTED NOT DETECTED Final    Comment: Performed at [facility], [address], [place], [place] [address]  MRSA Next Gen by PCR, Nasal     Status: None   Collection Time: 01/10/24 12:11 PM   Specimen: Nasal Mucosa; Nasal Swab  Result Value Ref Range Status   MRSA by PCR Next Gen NOT DETECTED NOT DETECTED Final    Comment: (NOTE) The GeneXpert MRSA Assay (FDA approved for NASAL specimens only), is one component of  a comprehensive MRSA colonization surveillance program. It is not intended to  diagnose MRSA infection nor to guide or monitor treatment for MRSA infections. Test performance is not FDA approved in patients less than 44 years old. Performed at [facility], [address], [place], [place] [address]   Culture, Respiratory w Gram Stain (tracheal aspirate)     Status: None   Collection Time: 01/10/24 12:12 PM   Specimen: Tracheal Aspirate; Respiratory  Result Value Ref Range Status   Specimen Description   Final    TRACHEAL ASPIRATE Performed at [facility], [address] [place], [place] [address]    Special Requests   Final    NONE Performed at [facility], [address], [place], [place] [address]    Gram Stain   Final    RARE WBC PRESENT, PREDOMINANTLY PMN FEW GRAM POSITIVE COCCI    Culture   Final    FEW Normal respiratory flora-no Staph aureus or Pseudomonas seen Performed at [facility], [address] [place], [place] [address]    Report Status 01/12/2024 FINAL  Final  Body fluid culture w Gram Stain     Status: None (Preliminary result)   Collection Time: 01/10/24 12:19 PM   Specimen: Pleural Fluid  Result Value Ref Range Status   Specimen Description   Final    PLEURAL Performed at [facility], [address] [place], [place] [address]    Special Requests   Final    NONE Performed at [facility], [address] [place], [place] [address]    Gram Stain   Final    RARE WBC SEEN FEW GRAM POSITIVE COCCI RARE GRAM POSITIVE RODS RARE YEAST    Culture   Final    FEW STREPTOCOCCUS MITIS/ORALIS FEW CANDIDA GLABRATA Sent to [facility] for further susceptibility testing. Performed at [facility], [address] [place], [place] [address]    Report Status PENDING  Incomplete   Organism ID, Bacteria STREPTOCOCCUS MITIS/ORALIS  Final      Susceptibility   Streptococcus mitis/oralis - MIC*    PENICILLIN >=8 RESISTANT Resistant      CEFTRIAXONE  <=0.12 SENSITIVE Sensitive     LEVOFLOXACIN 2 SENSITIVE Sensitive     VANCOMYCIN 0.5 SENSITIVE Sensitive     * FEW STREPTOCOCCUS MITIS/ORALIS  Fungus Culture With Stain     Status: Abnormal (Preliminary result)   Collection Time: 01/10/24 12:19 PM   Specimen: Pleural Fluid  Result Value Ref Range Status   Fungus Stain Final report (A)  Final    Comment: (NOTE) Performed At: [facility] [address] [place], [place] [id number] Jennette Shorter MD Ey:[phone]    Fungus (Mycology) Culture PENDING  Incomplete   Fungal Source PLEURAL  Final    Comment: Performed at [facility], [address] [place], [place] [address]  Fungus Culture Result     Status: Abnormal   Collection Time: 01/10/24 12:19 PM  Result Value Ref Range Status   Result 1 Yeast observed (A)  Final    Comment: (NOTE) Performed At: [facility] [address] [place], [place] [id number] Jennette Shorter MD Ey:[phone]   Respiratory (~20 pathogens) panel by PCR     Status: None   Collection Time: 01/10/24  1:34 PM   Specimen: Nasopharyngeal Swab; Respiratory  Result Value Ref Range Status   Adenovirus NOT DETECTED NOT DETECTED Final   Coronavirus 229E NOT DETECTED NOT  DETECTED Final    Comment: (NOTE) The Coronavirus on the Respiratory Panel, DOES NOT test for the novel  Coronavirus (2019 nCoV)    Coronavirus HKU1 NOT DETECTED NOT DETECTED Final   Coronavirus NL63 NOT DETECTED NOT DETECTED Final   Coronavirus OC43 NOT DETECTED NOT DETECTED Final   Metapneumovirus NOT DETECTED NOT DETECTED Final   Rhinovirus / Enterovirus NOT DETECTED NOT DETECTED Final   Influenza A NOT DETECTED NOT DETECTED Final   Influenza B NOT DETECTED NOT DETECTED Final   Parainfluenza Virus 1 NOT DETECTED NOT DETECTED Final   Parainfluenza Virus 2 NOT DETECTED NOT DETECTED Final   Parainfluenza Virus 3 NOT DETECTED NOT DETECTED Final   Parainfluenza Virus 4 NOT DETECTED NOT DETECTED  Final   Respiratory Syncytial Virus NOT DETECTED NOT DETECTED Final   Bordetella pertussis NOT DETECTED NOT DETECTED Final   Bordetella Parapertussis NOT DETECTED NOT DETECTED Final   Chlamydophila pneumoniae NOT DETECTED NOT DETECTED Final   Mycoplasma pneumoniae NOT DETECTED NOT DETECTED Final    Comment: Performed at [facility], [address], [place], [place] [address]  Resp panel by RT-PCR (RSV, Flu A&B, Covid) Anterior Nasal Swab     Status: None   Collection Time: 01/10/24  1:34 PM   Specimen: Anterior Nasal Swab  Result Value Ref Range Status   SARS Coronavirus 2 by RT PCR NEGATIVE NEGATIVE Final    Comment: (NOTE) SARS-CoV-2 target nucleic acids are NOT DETECTED.  The SARS-CoV-2 RNA is generally detectable in upper respiratory specimens during the acute phase of infection. The lowest concentration of SARS-CoV-2 viral copies this assay can detect is 138 copies/mL. A negative result does not preclude SARS-Cov-2 infection and should not be used as the sole basis for treatment or other patient management decisions. A negative result may occur with  improper specimen collection/handling, submission of specimen other than nasopharyngeal swab, presence of viral mutation(s) within the areas targeted by this assay, and inadequate number of viral copies(<138 copies/mL). A negative result must be combined with clinical observations, patient history, and epidemiological information. The expected result is Negative.  Fact Sheet for Patients:  bloggercourse.com  Fact Sheet for Healthcare Providers:  seriousbroker.it  This test is no t yet approved or cleared by the [place]  FDA and  has been authorized for detection and/or diagnosis of SARS-CoV-2 by FDA under an Emergency Use Authorization (EUA). This EUA will remain  in effect (meaning this test can be used) for the duration of the COVID-19 declaration under Section  564(b)(1) of the Act, 21 U.S.C.section 360bbb-3(b)(1), unless the authorization is terminated  or revoked sooner.       Influenza A by PCR NEGATIVE NEGATIVE Final   Influenza B by PCR NEGATIVE NEGATIVE Final    Comment: (NOTE) The Xpert Xpress SARS-CoV-2/FLU/RSV plus assay is intended as an aid in the diagnosis of influenza from Nasopharyngeal swab specimens and should not be used as a sole basis for treatment. Nasal washings and aspirates are unacceptable for Xpert Xpress SARS-CoV-2/FLU/RSV testing.  Fact Sheet for Patients: bloggercourse.com  Fact Sheet for Healthcare Providers: seriousbroker.it  This test is not yet approved or cleared by the [place]  FDA and has been authorized for detection and/or diagnosis of SARS-CoV-2 by FDA under an Emergency Use Authorization (EUA). This EUA will remain in effect (meaning this test can be used) for the duration of the COVID-19 declaration under Section 564(b)(1) of the Act, 21 U.S.C. section 360bbb-3(b)(1), unless the authorization is terminated or revoked.  Resp Syncytial Virus by PCR NEGATIVE NEGATIVE Final    Comment: (NOTE) Fact Sheet for Patients: bloggercourse.com  Fact Sheet for Healthcare Providers: seriousbroker.it  This test is not yet approved or cleared by the [place]  FDA and has been authorized for detection and/or diagnosis of SARS-CoV-2 by FDA under an Emergency Use Authorization (EUA). This EUA will remain in effect (meaning this test can be used) for the duration of the COVID-19 declaration under Section 564(b)(1) of the Act, 21 U.S.C. section 360bbb-3(b)(1), unless the authorization is terminated or revoked.  Performed at [facility], [address], [place], [place] [address]   Yeast Susceptibilities     Status: None   Collection Time: 01/10/24  2:12 PM  Result Value Ref  Range Status   SOURCE LAB 10504 C GLABRATA PLEURAL FLD SENSI  Corrected    Comment: Performed at [facility], [address] [place], [place] [address] CORRECTED ON 11/25 AT 0845: PREVIOUSLY REPORTED AS LAB 10504 C GLABRATA BLOOD SENSI    Organism ID, Yeast Preliminary report  Final    Comment: (NOTE) Specimen has been received and testing has been initiated. Performed At: [facility] [address] [place], [place] [id number] Jennette Shorter MD Ey:[phone]    Amphotericin B MIC PENDING  Incomplete   Anidulafungin MIC PENDING  Incomplete   Caspofungin MIC PENDING  Incomplete   Fluconazole Islt MIC PENDING  Incomplete   ISAVUCONAZOLE MIC PENDING  Incomplete   Itraconazole MIC PENDING  Incomplete   Micafungin  MIC PENDING  Incomplete   Posaconazole MIC PENDING  Incomplete   REZAFUNGIN MIC PENDING  Incomplete   Voriconazole MIC PENDING  Incomplete  Culture, blood (Routine X 2) w Reflex to ID Panel     Status: None (Preliminary result)   Collection Time: 01/15/24  8:46 PM   Specimen: BLOOD RIGHT FOREARM  Result Value Ref Range Status   Specimen Description   Final    BLOOD RIGHT FOREARM Performed at [facility], [address], [place], [place] [address]    Special Requests   Final    BOTTLES DRAWN AEROBIC ONLY Blood Culture results may not be optimal due to an inadequate volume of blood received in culture bottles Performed at [facility], [address], [place], [place] [address]    Culture  Setup Time   Final    GRAM POSITIVE COCCI AEROBIC BOTTLE ONLY CRITICAL RESULT CALLED TO, READ BACK BY AND VERIFIED WITH: PHARMD E JACKSON 01/17/2024 @ 0429 BY AB Performed at [facility], [address], [place], [place] [address]    Culture GRAM POSITIVE COCCI  Final   Report Status PENDING  Incomplete  Culture, blood (Routine X 2) w Reflex to ID Panel     Status: None (Preliminary result)   Collection Time: 01/15/24  8:46 PM    Specimen: BLOOD RIGHT HAND  Result Value Ref Range Status   Specimen Description   Final    BLOOD RIGHT HAND Performed at [facility], [address] [place], [place] [address]    Special Requests   Final    BOTTLES DRAWN AEROBIC ONLY Blood Culture results may not be optimal due to an inadequate volume of blood received in culture bottles Performed at [facility], [address], [place], [place] [address]    Culture   Final    NO GROWTH 1 DAY Performed at [facility], [address]  [place], [place] [address]    Report Status PENDING  Incomplete  Blood Culture ID Panel (Reflexed)     Status: Abnormal   Collection Time: 01/15/24  8:46 PM  Result Value Ref Range Status   Enterococcus faecalis NOT DETECTED NOT DETECTED Final   Enterococcus Faecium DETECTED (A) NOT DETECTED Final    Comment: CRITICAL RESULT CALLED TO, READ BACK BY AND VERIFIED WITH: PHARMD E JACKSON 01/17/2024 @ 0429 BY AB    Listeria monocytogenes NOT DETECTED NOT DETECTED Final   Staphylococcus species NOT DETECTED NOT DETECTED Final   Staphylococcus aureus (BCID) NOT DETECTED NOT DETECTED Final   Staphylococcus epidermidis NOT DETECTED NOT DETECTED Final   Staphylococcus lugdunensis NOT DETECTED NOT DETECTED Final   Streptococcus species NOT DETECTED NOT DETECTED Final   Streptococcus agalactiae NOT DETECTED NOT DETECTED Final   Streptococcus pneumoniae NOT DETECTED NOT DETECTED Final   Streptococcus pyogenes NOT DETECTED NOT DETECTED Final   A.calcoaceticus-baumannii NOT DETECTED NOT DETECTED Final   Bacteroides fragilis NOT DETECTED NOT DETECTED Final   Enterobacterales NOT DETECTED NOT DETECTED Final   Enterobacter cloacae complex NOT DETECTED NOT DETECTED Final   Escherichia coli NOT DETECTED NOT DETECTED Final   Klebsiella aerogenes NOT DETECTED NOT DETECTED Final   Klebsiella oxytoca NOT DETECTED NOT DETECTED Final   Klebsiella pneumoniae NOT DETECTED NOT DETECTED Final    Proteus species NOT DETECTED NOT DETECTED Final   Salmonella species NOT DETECTED NOT DETECTED Final   Serratia marcescens NOT DETECTED NOT DETECTED Final   Haemophilus influenzae NOT DETECTED NOT DETECTED Final   Neisseria meningitidis NOT DETECTED NOT DETECTED Final   Pseudomonas aeruginosa NOT DETECTED NOT DETECTED Final   Stenotrophomonas maltophilia NOT DETECTED NOT DETECTED Final   Candida albicans NOT DETECTED NOT DETECTED Final   Candida auris NOT DETECTED NOT DETECTED Final   Candida glabrata NOT DETECTED NOT DETECTED Final   Candida krusei NOT DETECTED NOT DETECTED Final   Candida parapsilosis NOT DETECTED NOT DETECTED Final   Candida tropicalis NOT DETECTED NOT DETECTED Final   Cryptococcus neoformans/gattii NOT DETECTED NOT DETECTED Final   Vancomycin resistance DETECTED (A) NOT DETECTED Final    Comment: CRITICAL RESULT CALLED TO, READ BACK BY AND VERIFIED WITH: PHARMD E JACKSON 01/17/2024 @ 0429 BY AB Performed at [facility], [address], [place], [place] [address]      Studies/Results: DG Chest Port 1 View Result Date: 01/17/2024 CLINICAL DATA:  Acute respiratory failure. Follow-up pneumothorax. On ventilator. EXAM: PORTABLE CHEST 1 VIEW COMPARISON:  01/16/2024 FINDINGS: Patient is significantly rotated to the right which limits evaluation. Endotracheal tube remains in appropriate position. A right-sided pleural catheter is seen overlying the right lung base. No definite pneumothorax is seen on today's exam which is limited by positioning. Decreased infiltrate or atelectasis is seen in the right lung base. Left lung remains clear. Heart size within normal limits. IMPRESSION: Limited exam due to positioning. No definite pneumothorax visualized on today's exam. Decreased right basilar infiltrate or atelectasis. Electronically Signed   By: Norleen DELENA Kil M.D.   On: 01/17/2024 05:40   DG Abd 1 View Result Date: 01/16/2024 EXAM: 1 VIEW XRAY OF THE ABDOMEN 01/16/2024  07:29:00 PM COMPARISON: 01/10/2024 CLINICAL HISTORY: Encounter for imaging study to confirm nasogastric (NG) tube placement. FINDINGS: LINES, TUBES AND DEVICES: Enteric tube tip and side port in satisfactory position. Right chest tube noted. Right femoral line noted. BOWEL: Nonobstructive bowel gas pattern. SOFT TISSUES: Cholecystectomy clips noted. No opaque urinary calculi. BONES: Levoscoliosis  of the lumbar spine. No acute osseous abnormality. PLEURAL SPACES: Right pleural effusion. IMPRESSION: 1. Enteric tube tip and side port in satisfactory position. Electronically signed by: Norman Gatlin MD 01/16/2024 07:37 PM EST RP Workstation: [id number]   CT CHEST WO CONTRAST Result Date: 01/16/2024 CLINICAL DATA:  Assess for esophageal tear. EXAM: CT CHEST WITHOUT CONTRAST TECHNIQUE: Multidetector CT imaging of the chest was performed following the standard protocol without IV contrast. RADIATION DOSE REDUCTION: This exam was performed according to the departmental dose-optimization program which includes automated exposure control, adjustment of the mA and/or kV according to patient size and/or use of iterative reconstruction technique. COMPARISON:  Esophagram same day.  CT chest 01/15/2024. FINDINGS: Cardiovascular: The heart is mildly enlarged. The aorta is normal in size. There are atherosclerotic calcifications of the aorta. There is no pericardial effusion. Mediastinum/Nodes: The endotracheal tube tip is proximally 3.3 cm above the carina. Enteric tube is seen throughout nondilated esophagus. Hiatal hernia containing Nissen fundoplication likely present. There is oral contrast seen within the region of the Nissen fundoplication and stomach likely related to recent esophagram. There is a small column of contrast extravasating from the right side of the Nissen fundoplication area within the hiatal hernia on image 2/113 with minimal contrast extending into the adjacent right pleural space. There is no evidence  for pneumomediastinum. No enlarged lymph nodes are seen. Lungs/Pleura: There is a small layering right pleural effusion which has significantly decreased from prior. Right pleural drainage catheter is in place. There is a small amount of air in the right pleural space likely related to the catheter. Patchy airspace and ground-glass opacities are seen in the bilateral lower lobes, right middle lobe and bilateral upper lobes. There is mild emphysema. There is also some likely new aspirated oral contrast within peripheral right lower lobe bronchi. Upper Abdomen: No acute abnormality. Musculoskeletal: No chest wall mass or suspicious bone lesions identified. IMPRESSION: 1. Small column of contrast extravasating from the right side of the Nissen fundoplication area within the hiatal hernia with minimal contrast extending into the adjacent right pleural space. Findings are compatible with small leak. 2. Small right pleural effusion has significantly decreased from prior. Right pleural drainage catheter is in place. 3. Patchy airspace and ground-glass opacities in the bilateral lower lobes, right middle lobe and bilateral upper lobes worrisome for multifocal pneumonia. 4. New aspirated oral contrast within peripheral right lower lobe bronchi. Aortic Atherosclerosis (ICD10-I70.0) and Emphysema (ICD10-J43.9). These results were called by telephone at the time of interpretation on 01/16/2024 at 6:35 pm to provider [facility] , who verbally acknowledged these results. Electronically Signed   By: Greig Pique M.D.   On: 01/16/2024 18:35   DG CHEST PORT 1 VIEW Result Date: 01/16/2024 CLINICAL DATA:  Enteric tube placement EXAM: PORTABLE CHEST 1 VIEW COMPARISON:  Chest radiograph dated 01/15/2024 FINDINGS: Lines/tubes: Endotracheal tube tip projects 3.0 cm above the carina. Gastric/enteric tube has been retracted and tip projects over the mid esophagus. Right basilar pleural catheter in-situ, unchanged. Lungs: Slightly  increased diffuse patchy opacities, right-greater-than-left. Pleura: Unchanged trace right pleural effusion. Possible trace pneumothorax component at the right apex. Heart/mediastinum: The heart size and mediastinal contours are within normal limits. Bones: No acute osseous abnormality. Right upper quadrant surgical clips. Cervical spinal fixation hardware appears intact. IMPRESSION: 1. Gastric/enteric tube has been retracted and tip projects over the mid esophagus. 2. Endotracheal tube tip projects 3.0 cm above the carina. 3. Slightly increased diffuse patchy opacities, right-greater-than-left. 4. Unchanged trace right pleural effusion.  Possible trace pneumothorax component at the right apex, better evaluated on subsequent chest CT. Electronically Signed   By: Limin  Xu M.D.   On: 01/16/2024 15:02   DG ESOPHAGUS W SINGLE CM (SOL OR THIN BA) Result Date: 01/16/2024 EXAM: SINGLE CONTRAST ESOPHAGRAM 01/16/2024 12:15:00 PM TECHNIQUE: Multiple single contrast images of the esophagus and gastroesophageal junction were obtained following the oral administration of water  soluble contrast. The patient is endotracheally intubated. Today's examination is performed through the nasogastric tube, the tip of which is in the mid esophagus. Approximately 35 cc of Omnipaque  300 was injected through the feeding tube into the esophagus. Much of this extended proximally towards the oral cavity, although some slowly percolated through the distal esophagus into the stomach. Various maneuvers including tilting the table up to 10 degrees and turning of the patient was performed to depict the distal esophagus. Some of the contrast was gently suctioned from the esophagus back through the nasogastric tube, and the nasogastric tube was flushed with water . FLUOROSCOPY DOSE AND TYPE: Radiation Dose Index: Reference Air Kerma (in mGy) = 20.1 COMPARISON: CT abdomen from 12/29/2023. CLINICAL HISTORY: Right pneumonia and pneumothorax, assessment  for esophageal perforation. FINDINGS: The nasogastric tube tip is in the mid esophagus. Following injection of approximately 35 cc of Omnipaque  300 through the nasogastric tube, contrast extended proximally towards the oral cavity and slowly percolated through the distal esophagus into the stomach. Contrast medium flows from the distal esophagus into the stomach with secondary and tertiary esophageal contractions noted. The appearance of curvilinear contrast extending in this region from the stomach into the lower chest on series 3 is compatible with the appearance of the fundoplication wrap, for example on the CT abdomen from 12/29/2023, and is not felt to be indicative of a leak. This fundoplication wrap only filled in a very minimal manner; there was no feasible way to encourage more filling. Overall no esophageal leak was demonstrated on today's exam. No stricture or obstruction. IMPRESSION: 1. No esophageal perforation demonstrated limited assessment. Electronically signed by: Ryan Salvage MD 01/16/2024 12:32 PM EST RP Workstation: [id number]   DG CHEST PORT 1 VIEW Result Date: 01/15/2024 CLINICAL DATA:  And highly [place] EXAM: PORTABLE CHEST 1 VIEW COMPARISON:  01/15/2024 FINDINGS: Single frontal view of the chest demonstrates repositioning or replacement of the right pleural drainage catheter, now coiled over the right lower hemithorax. The right-sided hydropneumothorax noted previously has diminished significantly after replacement/repositioning of the catheter. Multifocal bilateral airspace disease, right greater than left, again noted. Cardiac silhouette is unremarkable. Endotracheal tube and enteric catheter are again noted. IMPRESSION: 1. Marked reduction in the loculated right hydropneumothorax noted on prior exam after repositioning/replacement of the right-sided pigtail pleural drainage catheter. Catheter is now coiled over the right lung base. 2. Persistent multifocal bilateral airspace  disease consistent with pneumonia. Electronically Signed   By: Ozell Daring M.D.   On: 01/15/2024 17:24   CT CHEST WO CONTRAST Result Date: 01/15/2024 CLINICAL DATA:  Pneumonia complicated by empyema status post chest tube placement EXAM: CT CHEST WITHOUT CONTRAST TECHNIQUE: Multidetector CT imaging of the chest was performed following the standard protocol without IV contrast. RADIATION DOSE REDUCTION: This exam was performed according to the departmental dose-optimization program which includes automated exposure control, adjustment of the mA and/or kV according to patient size and/or use of iterative reconstruction technique. COMPARISON:  CTA chest dated 01/10/2024 FINDINGS: Cardiovascular: Normal heart size. No significant pericardial fluid/thickening. Great vessels are normal in course and caliber. Aortic atherosclerosis. Mediastinum/Nodes: Imaged thyroid  gland without nodules meeting criteria for imaging follow-up by size. Small hiatal hernia. Unchanged multi station mediastinal lymphadenopathy, for example 11 mm precarinal (2:60). Lungs/Pleura: ETT tip terminates 4.0 cm above the carina. Right lateral approach pleural catheter is retracted within the subcutaneous soft tissues of the right inferolateral chest. Secretions within the trachea central airways are otherwise patent. Increased multifocal consolidations and ground-glass opacities. Large right pleural effusion. Decreased trace right pneumothorax. Upper abdomen: Cholecystectomy. Partially imaged bilateral adrenal thickening. Musculoskeletal: No acute or abnormal lytic or blastic osseous lesions. Cervical spinal fusion hardware appears intact. Well corticated triangular radiodensity at the anterior inferior aspect of the left glenoid (8:23), which may reflect sequela of remote injury. IMPRESSION: 1. Right lateral approach pleural catheter tip is retracted within the subcutaneous soft tissues of the right inferolateral chest. 2. Increased multifocal  consolidations and ground-glass opacities, likely worsening multifocal pneumonia. 3. Large right pleural effusion. Decreased trace right pneumothorax. 4. Unchanged multi station mediastinal lymphadenopathy, likely reactive. 5.  Aortic Atherosclerosis (ICD10-I70.0). These results will be called to the ordering clinician or representative by the Radiologist Assistant, and communication documented in the PACS or [employer]. Electronically Signed   By: Limin  Xu M.D.   On: 01/15/2024 15:44    Assessment/Plan: 63 yo female with empysema, gerd, fibromyalgia, admitted [facility]22 with ams, chest pain, hypoxemic resp failure in setting lobar pna and right sided pleural effusion/pneumothorax, subsequently developed septic shock/ards in setting empyema/fungemia    Found to have esophageal leak with polymicrobial empyema s/p chest tube, candida glabrata and enterococcal bacteremia VRE  Rec Cont dapto, ctx flagyl  as well as micafungin . Repeat bcx since 11/27 + enterococcus.   Thank you very much for the consult. Will follow with you.  Renee Gutierrez   01/17/2024, 10:57 AM

## 2024-01-17 NOTE — Progress Notes (Signed)
 Wasted 0mL of dilaudid  with Glade Piety, RN.

## 2024-01-17 NOTE — Progress Notes (Addendum)
 eLink Physician-Brief Progress Note Patient Name: Renee Gutierrez DOB: [birth year] MRN: [id number]   Date of Service  01/17/2024  HPI/Events of Note  Pt agitated on vent, Prec 0.4 and Dilaudid  paused d/t drop in MAP 57-60 by aline. Received 2mg  Vers IVP d/t ^^ agitation w/ PT. BSN statets BP responded well w/ fluid bolus' ealier.  Camera: Discussed with RN, In synchrony with Vent, MAP > 65, sats good.  Now her Vitals are back to baseline after sedation.   eICU Interventions  No intervention needed at this time. Continue care.      Intervention Category Intermediate Interventions: Hypotension - evaluation and management  [name] 01/17/2024, 8:34 PM  0100 BSRN asking PRN Versed  can be changed 0.5-2 for agitation. 1 mg barely holds her for long and 2 mg drops BP  - changed Versed  to 0.5 to 2 mg prn. Per RN discussion.   02:25 Per BSRN,  I'm maxed out on sedation. dilaudid  at 2 and dex at 1.2. she's still waking up very agitated, SBP >200, now 231/104 (155).Also utizing Vers PRN - discussed with RN. Maxed out of precdex. Fluctuating labile BP. Can not keep her sedated. Has mitts on right hand. BCX 11/27 1/2 with enterococcus faecium.  11/22 with candida glabrata - start low dose propofol  and watch for triglycerides. Watch for hypotension

## 2024-01-17 NOTE — Progress Notes (Signed)
 PHARMACY - PHYSICIAN COMMUNICATION CRITICAL VALUE ALERT - BLOOD CULTURE IDENTIFICATION (BCID)  Renee Gutierrez is an 63 y.o. female who presented to [facility] on 01/10/2024 with a chief complaint of R secondary spontaneous pneumothorax, and concern for opiate toxidrome   Assessment:  1/2 enterococcus faecium, van A/B detected  Name of physician (or Provider) Contacted: Kassie  Current antibiotics: CTX, micafungin   Changes to prescribed antibiotics recommended:  Start daptomycin 500mg  (10mg /kg) IV q24h  Results for orders placed or performed during the hospital encounter of 01/10/24  Blood Culture ID Panel (Reflexed) (Collected: 01/15/2024  8:46 PM)  Result Value Ref Range   Enterococcus faecalis NOT DETECTED NOT DETECTED   Enterococcus Faecium DETECTED (A) NOT DETECTED   Listeria monocytogenes NOT DETECTED NOT DETECTED   Staphylococcus species NOT DETECTED NOT DETECTED   Staphylococcus aureus (BCID) NOT DETECTED NOT DETECTED   Staphylococcus epidermidis NOT DETECTED NOT DETECTED   Staphylococcus lugdunensis NOT DETECTED NOT DETECTED   Streptococcus species NOT DETECTED NOT DETECTED   Streptococcus agalactiae NOT DETECTED NOT DETECTED   Streptococcus pneumoniae NOT DETECTED NOT DETECTED   Streptococcus pyogenes NOT DETECTED NOT DETECTED   A.calcoaceticus-baumannii NOT DETECTED NOT DETECTED   Bacteroides fragilis NOT DETECTED NOT DETECTED   Enterobacterales NOT DETECTED NOT DETECTED   Enterobacter cloacae complex NOT DETECTED NOT DETECTED   Escherichia coli NOT DETECTED NOT DETECTED   Klebsiella aerogenes NOT DETECTED NOT DETECTED   Klebsiella oxytoca NOT DETECTED NOT DETECTED   Klebsiella pneumoniae NOT DETECTED NOT DETECTED   Proteus species NOT DETECTED NOT DETECTED   Salmonella species NOT DETECTED NOT DETECTED   Serratia marcescens NOT DETECTED NOT DETECTED   Haemophilus influenzae NOT DETECTED NOT DETECTED   Neisseria meningitidis NOT DETECTED NOT DETECTED    Pseudomonas aeruginosa NOT DETECTED NOT DETECTED   Stenotrophomonas maltophilia NOT DETECTED NOT DETECTED   Candida albicans NOT DETECTED NOT DETECTED   Candida auris NOT DETECTED NOT DETECTED   Candida glabrata NOT DETECTED NOT DETECTED   Candida krusei NOT DETECTED NOT DETECTED   Candida parapsilosis NOT DETECTED NOT DETECTED   Candida tropicalis NOT DETECTED NOT DETECTED   Cryptococcus neoformans/gattii NOT DETECTED NOT DETECTED   Vancomycin resistance DETECTED (A) NOT DETECTED    Leeroy Mace RPh 01/17/2024, 4:43 AM

## 2024-01-17 NOTE — Consult Note (Addendum)
  General surgery is been asked to evaluate this 63 year old patient at the request of Dr. Jude from [facility].  She is currently in the intensive care unit for ventilator with acute hypoxic respiratory failure and septic shock.  She had a Nissen fundoplication in 2011 at [facility] by cardiothoracic surgeons.  She has had a lot of phonic medical issues and had also been having nausea and vomiting for several months.  She has had 3 upper endoscopies in the last several weeks.  At the last procedure there was a biopsy done of an ulcer in the gastric cardia.  On CT scans she does have a recurrent hiatal hernia with the fundoplication found in the chest.  Several days after the last procedure proceed presented with somnolence and hypoxia.  She was intubated and taken to [facility].  Since admission here she has been found to be fungemia with also VRE bacteremia noted.  A chest tube had been placed on the right side.  She improved to the point of extubation but then has been reintubated.  Further workup including an esophagram and CT of the chest with contrast demonstrate now there is a leak likely from the cardia the stomach and the chest first and distal esophagus.  It is felt more likely to be from the stomach given the recent procedure.  On exam, she is intubated but not sedated.  Her abdomen is soft and nondistended.  Drainage in the right chest tube is thin yellow.  Impression: Perforated gastric cardia with Nissen Fundoplication now in the chest.  General surgery, cardiothoracic surgery, and gastroenterology have been asked to weigh in on the situation.  Currently, conservative management is being undertaken with a nasogastric tube placed in the esophagus and the tube in the chest to control the drainage.  She is malnourished and a poor surgical candidate for emergent intervention.  She would likely need a combined procedure potentially with general surgery and cardiothoracic surgery.  It is recommended  that the patient be transferred to a tertiary care center for the potential for GI to evaluate and potentially stent or clip in area of perforation in hopes of continued conservative management to improve the patient's nutrition prior to any surgical intervention.  My partner, Dr. Sheldon, who performs lap and robotic hiatal surgery as also reviewed the chart and images as well as discussed this with CT surgery and they both agree with the recommendations. CCM is also aware  Complex medical decision making

## 2024-01-17 NOTE — Progress Notes (Signed)
 eLink Physician-Brief Progress Note Patient Name: Renee Gutierrez DOB: 02-Sep-1960 MRN: [id number]   Date of Service  01/17/2024  HPI/Events of Note  Notified Bcx ID 11/27 +VRE  Currently off pressors  eICU Interventions  Add daptomycin Continue ceftriaxone , flagyl , micafungin      Intervention Category Minor Interventions: Communication with other healthcare providers and/or family  Lou Irigoyen Slater Staff 01/17/2024, 4:39 AM

## 2024-01-17 NOTE — Progress Notes (Signed)
 I have reviewed patient's case.    An endoscopy would highly likely not be able to identify source of perforation in stomach and given her altered anatomy from prior Nissen and paraesophageal hernia, we would likely not be able to deploy an esophageal stent that would stay in place.  Moreover, we don't have any GI staff on call this weekend capable of such interventions.  If further GI endoscopic interventions still desired mindful of above, then I would recommend transfer to tertiary center.  I have discussed case with Dr. Georgette Poli.  [facility] has no further recommendations.

## 2024-01-17 NOTE — Consult Note (Addendum)
 Palliative Care Consult Note                                  Date: 01/17/2024   Patient Name: Renee Gutierrez  DOB: [birth year]  MRN: [id number]  Age / Sex: 63 y.o., female  PCP: Patient, No Pcp Per Referring Physician: Jude Harden GAILS, MD  Reason for Consultation: Establishing goals of care  HPI/Patient Profile: 63 y.o. female  with past medical history of HTN, fibromyalgia, Nissen fundoplication, GERD, and emphysema who presented to [facility] on 01/10/2024 with chest pain, somnolence and hypoxia.  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   11/28 esophogram negative. Repeat CT chest shows extravasation into right chest.  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  I introduced Palliative Medicine as specialized medical care for people living with serious illness. It focuses on providing relief from symptoms and stress of a serious illness. The goal is to improve quality of life for both the patient and the family.  Today's  Discussion: Patient's husband has a good understanding of the patient's chronic conditions and current hospitalization. The patient has had eight hospitalizations and ten emergency visits over the last 6 months. We discussed the patient's fungemia, aspiration pneumonia, empyema, and likely gastric perforation. We discussed the patient's overall weakness and the severity of her condition. We discussed that she is likely too sick/ weak for surgery and at a very high risk of dying from her current condition. Elsie understands. We discussed the plan to allow time for outcomes- to see if the leak seals.  Elsie shared the patient has always been very independent. They have two children and five grandchildren. Prior to this admission the patient used a walker for ambulation. She would require some assistance with ADLs/ IADLs but tried to complete as much independently as she could. She would eat 1/4- 1/2 of meals. She was forced to retire after the operation for her Nissen fundoplication eight years ago. Since that surgery she has had frequent pain. 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 She enjoys crossword puzzles, watching tv, and spending time with family.  The patient does not have advanced directives. Her husband is proxy  decision maker-- he leans on his daughter for assistance when making decisions. The patient did not have a living will. Elsie shared the patient would not want permanent tubes/ life support. The patient's mother had throat cancer requiring tracheostomy-- the patient shared with her husband that she would never want a tracheostomy. We discussed the difference between an aggressive medical intervention path and a comfort focussed path. We discussed the plan to allow time for outcomes- to see if the leak seals. Encouraged Elsie and family to consider what the patient would want if she does not show improvement over the next few days.  Emotional support  and therapeutic listening provided. Discussed the importance of continued conversation with family and the medical providers regarding overall plan of care and treatment options, ensuring decisions are within the context of the patient's values and GOCs.  Questions and concerns were addressed. The family was encouraged to call with questions or concerns. PMT will continue to support holistically.  Addendum 12 pm: Spoke to patient's daughter [name] at bedside. [name] is hopeful her mother will have a meaningful recovery. She shared the patient is independent and valued her quality of life. Gave [name] Hard Choices booklet and encouraged her to consider what would/wouldn't be acceptable to the patient moving forward.   Review of Systems  Unable to perform ROS   Objective:   Primary Diagnoses: Present on Admission:  Septic shock (HCC)  Acute hypoxic respiratory failure (HCC)  Empyema (HCC)  Malnutrition of moderate degree  Chronic gastric ulcer (cardia) with perforation/leak  Incarcerated hiatal hernia with ulcer at cardia and perforation/leak   Physical Exam Vitals reviewed.  Constitutional:      General: She is not in acute distress.    Appearance: She is ill-appearing.     Interventions: She is sedated and intubated.  HENT:     Head: Normocephalic and atraumatic.  Cardiovascular:     Rate and Rhythm: Normal rate.  Pulmonary:     Effort: She is intubated.  Skin:    General: Skin is dry.     Vital Signs:  BP (!) 152/60 (BP Location: Right Arm)   Pulse 91   Temp 99 F (37.2 C)   Resp (!) 21   Ht 4' 11 (1.499 m)   Wt 47.9 kg   SpO2 98%   BMI 21.33 kg/m     Advanced Care Planning:   Existing Vynca/ACP Documentation: None  Primary Decision Maker: NEXT OF KIN  Code Status/Advance Care Planning: DNR   Assessment & Plan:   SUMMARY OF RECOMMENDATIONS   Continue DNR Time for outcomes Continued PMT support   Discussed with: bedside RN and Dr.  Jude  Time Total: 90 minutes   Thank you for allowing us  to participate in the care of Tatem Holsonback Delahunty PMT will continue to support holistically.    Signed by: Stephane Palin, NP Palliative Medicine Team  Team Phone # [phone] (Nights/Weekends)  01/17/2024, 10:35 AM

## 2024-01-18 ENCOUNTER — Inpatient Hospital Stay (HOSPITAL_COMMUNITY)

## 2024-01-18 DIAGNOSIS — R6521 Severe sepsis with septic shock: Secondary | ICD-10-CM | POA: Diagnosis not present

## 2024-01-18 DIAGNOSIS — A419 Sepsis, unspecified organism: Secondary | ICD-10-CM | POA: Diagnosis not present

## 2024-01-18 DIAGNOSIS — A491 Streptococcal infection, unspecified site: Secondary | ICD-10-CM | POA: Insufficient documentation

## 2024-01-18 DIAGNOSIS — B379 Candidiasis, unspecified: Secondary | ICD-10-CM | POA: Insufficient documentation

## 2024-01-18 DIAGNOSIS — Z515 Encounter for palliative care: Secondary | ICD-10-CM | POA: Diagnosis not present

## 2024-01-18 DIAGNOSIS — J9601 Acute respiratory failure with hypoxia: Secondary | ICD-10-CM | POA: Diagnosis not present

## 2024-01-18 DIAGNOSIS — E440 Moderate protein-calorie malnutrition: Secondary | ICD-10-CM | POA: Insufficient documentation

## 2024-01-18 DIAGNOSIS — Z7189 Other specified counseling: Secondary | ICD-10-CM | POA: Diagnosis not present

## 2024-01-18 DIAGNOSIS — E441 Mild protein-calorie malnutrition: Secondary | ICD-10-CM | POA: Diagnosis not present

## 2024-01-18 DIAGNOSIS — K9189 Other postprocedural complications and disorders of digestive system: Secondary | ICD-10-CM | POA: Diagnosis not present

## 2024-01-18 LAB — BASIC METABOLIC PANEL WITH GFR
Anion gap: 9 (ref 5–15)
BUN: 41 mg/dL — ABNORMAL HIGH (ref 8–23)
CO2: 26 mmol/L (ref 22–32)
Calcium: 9.5 mg/dL (ref 8.9–10.3)
Chloride: 114 mmol/L — ABNORMAL HIGH (ref 98–111)
Creatinine, Ser: 0.58 mg/dL (ref 0.44–1.00)
GFR, Estimated: >60 mL/min (ref >60)
Glucose, Bld: 125 mg/dL — ABNORMAL HIGH (ref 70–99)
Potassium: 2.8 mmol/L — ABNORMAL LOW (ref 3.5–5.1)
Sodium: 148 mmol/L — ABNORMAL HIGH (ref 135–145)

## 2024-01-18 LAB — YEAST SUSCEPTIBILITIES
Amphotericin B MIC: 0.5
Fluconazole Islt MIC: 8
ISAVUCONAZOLE MIC: 0.12
Itraconazole MIC: 0.5
Posaconazole MIC: 1
Voriconazole MIC: 0.25

## 2024-01-18 LAB — HEPATIC FUNCTION PANEL
ALT: 204 U/L — ABNORMAL HIGH (ref 0–44)
AST: 82 U/L — ABNORMAL HIGH (ref 15–41)
Albumin: 2.2 g/dL — ABNORMAL LOW (ref 3.5–5.0)
Alkaline Phosphatase: 136 U/L — ABNORMAL HIGH (ref 38–126)
Bilirubin, Direct: 0.2 mg/dL (ref 0.0–0.2)
Indirect Bilirubin: 0.1 mg/dL — ABNORMAL LOW (ref 0.3–0.9)
Total Bilirubin: 0.3 mg/dL (ref 0.0–1.2)
Total Protein: 4.8 g/dL — ABNORMAL LOW (ref 6.5–8.1)

## 2024-01-18 LAB — LIPASE, BLOOD: Lipase: 46 U/L (ref 11–51)

## 2024-01-18 LAB — GLUCOSE, CAPILLARY
Glucose-Capillary: 104 mg/dL — ABNORMAL HIGH (ref 70–99)
Glucose-Capillary: 118 mg/dL — ABNORMAL HIGH (ref 70–99)
Glucose-Capillary: 121 mg/dL — ABNORMAL HIGH (ref 70–99)
Glucose-Capillary: 127 mg/dL — ABNORMAL HIGH (ref 70–99)
Glucose-Capillary: 144 mg/dL — ABNORMAL HIGH (ref 70–99)
Glucose-Capillary: 145 mg/dL — ABNORMAL HIGH (ref 70–99)

## 2024-01-18 LAB — CBC
HCT: 27.5 % — ABNORMAL LOW (ref 36.0–46.0)
Hemoglobin: 8.9 g/dL — ABNORMAL LOW (ref 12.0–15.0)
MCH: 25.9 pg — ABNORMAL LOW (ref 26.0–34.0)
MCHC: 32.4 g/dL (ref 30.0–36.0)
MCV: 79.9 fL — ABNORMAL LOW (ref 80.0–100.0)
Platelets: 123 K/uL — ABNORMAL LOW (ref 150–400)
RBC: 3.44 MIL/uL — ABNORMAL LOW (ref 3.87–5.11)
RDW: 18.2 % — ABNORMAL HIGH (ref 11.5–15.5)
WBC: 28.9 K/uL — ABNORMAL HIGH (ref 4.0–10.5)
nRBC: 1 % — ABNORMAL HIGH (ref 0.0–0.2)

## 2024-01-18 LAB — MAGNESIUM: Magnesium: 2 mg/dL (ref 1.7–2.4)

## 2024-01-18 LAB — CK: Total CK: 78 U/L (ref 38–234)

## 2024-01-18 LAB — PHOSPHORUS: Phosphorus: 1.4 mg/dL — ABNORMAL LOW (ref 2.5–4.6)

## 2024-01-18 MED ORDER — POTASSIUM CHLORIDE 10 MEQ/100ML IV SOLN
10.0000 meq | INTRAVENOUS | Status: AC
  Administered 2024-01-18 (×4): 10 meq via INTRAVENOUS
  Filled 2024-01-18 (×4): qty 100

## 2024-01-18 MED ORDER — LABETALOL HCL 5 MG/ML IV SOLN: 10.0000 mg | INTRAVENOUS | Status: DC | PRN

## 2024-01-18 MED ORDER — CARMEX CLASSIC LIP BALM EX OINT
TOPICAL_OINTMENT | CUTANEOUS | Status: DC | PRN
  Administered 2024-01-18 – 2024-01-21 (×2): 1 via TOPICAL
  Filled 2024-01-18 (×2): qty 10

## 2024-01-18 MED ORDER — POTASSIUM PHOSPHATES 15 MMOLE/5ML IV SOLN
30.0000 mmol | Freq: Once | INTRAVENOUS | Status: AC
  Administered 2024-01-18: 30 mmol via INTRAVENOUS
  Filled 2024-01-18: qty 10

## 2024-01-18 MED ORDER — MIDAZOLAM HCL (PF) 2 MG/2ML IJ SOLN
0.5000 mg | INTRAMUSCULAR | Status: DC | PRN
  Administered 2024-01-18: 1 mg via INTRAVENOUS
  Filled 2024-01-18: qty 2

## 2024-01-18 MED ORDER — MIDAZOLAM HCL (PF) 2 MG/2ML IJ SOLN
1.0000 mg | INTRAMUSCULAR | Status: DC | PRN
  Administered 2024-01-19 (×3): 1 mg via INTRAVENOUS
  Administered 2024-01-19: 2 mg via INTRAVENOUS
  Administered 2024-01-19 (×3): 1 mg via INTRAVENOUS
  Administered 2024-01-20: 2 mg via INTRAVENOUS
  Administered 2024-01-20 (×2): 1 mg via INTRAVENOUS
  Filled 2024-01-18 (×10): qty 2

## 2024-01-18 MED ORDER — BUDESONIDE 0.5 MG/2ML IN SUSP
0.5000 mg | Freq: Two times a day (BID) | RESPIRATORY_TRACT | Status: DC
  Administered 2024-01-18 – 2024-01-20 (×4): 0.5 mg via RESPIRATORY_TRACT
  Filled 2024-01-18 (×4): qty 2

## 2024-01-18 MED ORDER — DEXTROSE 5 % IV SOLN: INTRAVENOUS | Status: DC

## 2024-01-18 MED ORDER — PROPOFOL 1000 MG/100ML IV EMUL
0.0000 ug/kg/min | INTRAVENOUS | Status: DC
  Administered 2024-01-18 (×2): 50 ug/kg/min via INTRAVENOUS
  Administered 2024-01-18: 5 ug/kg/min via INTRAVENOUS
  Administered 2024-01-19: 65 ug/kg/min via INTRAVENOUS
  Administered 2024-01-19: 55 ug/kg/min via INTRAVENOUS
  Administered 2024-01-19 (×2): 80 ug/kg/min via INTRAVENOUS
  Administered 2024-01-19: 60 ug/kg/min via INTRAVENOUS
  Administered 2024-01-20: 75 ug/kg/min via INTRAVENOUS
  Administered 2024-01-20: 80 ug/kg/min via INTRAVENOUS
  Administered 2024-01-20 (×2): 75 ug/kg/min via INTRAVENOUS
  Administered 2024-01-20: 50 ug/kg/min via INTRAVENOUS
  Administered 2024-01-21 (×2): 80 ug/kg/min via INTRAVENOUS
  Administered 2024-01-21: 70 ug/kg/min via INTRAVENOUS
  Filled 2024-01-18 (×16): qty 100

## 2024-01-18 NOTE — Progress Notes (Signed)
 Daily Progress Note   Patient Name: Renee Gutierrez       Date: 01/18/2024 DOB: 1960/07/05  Age: 63 y.o. MRN#: [id number] Attending Physician: Jude Harden GAILS, MD Primary Care Physician: Patient, No Pcp Per Admit Date: 01/10/2024  Reason for Consultation/Follow-up: Establishing goals of care   Length of Stay: 8  Current Medications: Scheduled Meds:   arformoterol   15 mcg Nebulization BID   budesonide  (PULMICORT ) nebulizer solution  0.5 mg Nebulization BID   Chlorhexidine  Gluconate Cloth  6 each Topical Daily   heparin   5,000 Units Subcutaneous Q8H   nicotine   14 mg Transdermal Daily   mouth rinse  15 mL Mouth Rinse Q2H   pantoprazole  (PROTONIX ) IV  40 mg Intravenous Q12H   phenytoin  (DILANTIN ) IV  100 mg Intravenous Q12H   prednisoLONE  acetate  1 drop Left Eye Daily   revefenacin   175 mcg Nebulization Daily   sodium chloride  flush  10 mL Intrapleural Q8H    Continuous Infusions:  cefTRIAXone  (ROCEPHIN )  IV Stopped (01/18/24 0604)   DAPTOmycin Stopped (01/17/24 0603)   HYDROmorphone  2 mg/hr (01/18/24 0842)   metronidazole  Stopped (01/18/24 1010)   micafungin  (MYCAMINE ) 100 mg in sodium chloride  0.9 % 100 mL IVPB Stopped (01/17/24 1506)   potassium chloride  Stopped (01/18/24 1154)   potassium PHOSPHATE  IVPB (in mmol) 30 mmol (01/18/24 1053)   propofol  (DIPRIVAN ) infusion 30 mcg/kg/min (01/18/24 0842)    PRN Meds: acetaminophen  **OR** [DISCONTINUED] acetaminophen , alum & mag hydroxide-simeth, bisacodyl , hydrALAZINE , HYDROmorphone , HYDROmorphone  (DILAUDID ) injection, ipratropium-albuterol , labetalol , midazolam  PF, mouth rinse, phenol  Physical Exam Vitals reviewed.  Constitutional:      General: She is not in acute distress.    Appearance: She is ill-appearing.      Interventions: She is intubated.  Cardiovascular:     Rate and Rhythm: Normal rate.  Pulmonary:     Effort: She is intubated.  Skin:    General: Skin is dry.             Vital Signs: BP (!) 152/60 (BP Location: Right Arm)   Pulse 74   Temp 98.1 F (36.7 C)   Resp 18   Ht 4' 11 (1.499 m)   Wt 48.3 kg   SpO2 98%   BMI 21.51 kg/m  SpO2: SpO2: 98 % O2 Device: O2 Device: Ventilator O2  Flow Rate: O2 Flow Rate (L/min): 45 L/min      Patient Active Problem List   Diagnosis Date Noted   Malnutrition of moderate degree 01/18/2024   VRE (vancomycin-resistant Enterococci) infection 01/18/2024   Yeast infection 01/18/2024   Chronic gastric ulcer (cardia) with perforation/leak 01/17/2024   Incarcerated hiatal hernia with ulcer at cardia and perforation/leak 01/17/2024   Cachexia 01/17/2024   Constipation 01/17/2024   Spontaneous pneumothorax s/p right chest tube 01/17/2024   Severe protein-calorie malnutrition 01/13/2024   Empyema (HCC) 01/12/2024   Chronic gastric ulcer without hemorrhage and without perforation 01/11/2024   Abdominal pain, chronic, epigastric 01/11/2024   Septic shock (HCC) 01/10/2024   Acute hypoxic respiratory failure (HCC) 01/10/2024   Uncomplicated opioid dependence (HCC) 01/01/2024   Nausea vomiting and diarrhea 01/01/2024   Rectal bleeding 12/31/2023   Intractable nausea and vomiting 12/23/2023   Iron deficiency anemia due to chronic blood loss 12/17/2023   Acute on chronic diastolic CHF (congestive heart failure) (HCC) 12/14/2023   Peptic ulcer within a sliding hiatal hernia 12/14/2023   Bilateral lower extremity edema 12/12/2023   Hypophosphatemia 12/12/2023   Upper abdominal pain 12/07/2023   GIB (gastrointestinal bleeding) 12/04/2023   Acute gastric ulcer with hemorrhage 11/28/2023   Acute post-hemorrhagic anemia 11/27/2023   Esophageal hiatal hernia 11/26/2023   Essential hypertension 11/26/2023   Chronic anemia 11/02/2023   Trigeminal  neuralgia 05/13/2023   Anxiety 05/30/2020   AVM (arteriovenous malformation) brain 05/30/2020   External otitis of left ear 05/30/2020   Hypokalemia 05/30/2020   Hyperlipidemia 05/30/2020   History of intussusception 05/30/2020   Migraines 05/30/2020   Restless leg syndrome 05/30/2020   CKD (chronic kidney disease), 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/2020   Intussusception (HCC) 05/30/2020   Medicare annual wellness visit, subsequent 05/30/2020   MRSA (methicillin resistant staph aureus) culture positive 05/30/2020   Rash 05/30/2020   Prediabetes 05/30/2020   Nausea & vomiting 05/30/2020   Chronic hip pain 05/30/2020   IBS (irritable bowel syndrome) 05/30/2020   Severe chronic obstructive pulmonary disease (HCC) 05/17/2020   Long-term current use of opiate analgesic 11/19/2018   ACTH elevation 12/12/2016   Trigeminal neuralgia pain 07/31/2016   Resistant hypertension 02/18/2016   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   Ganglion cyst of dorsum of right wrist 09/13/2015   Mild intermittent asthma without complication 04/25/2015   Idiopathic peripheral neuropathy 11/22/2014   Adrenal adenoma, right 07/10/2014   Prolactinoma (HCC) 07/07/2014   Osteoarthritis of cervical spine 06/06/2014   Cubital tunnel syndrome 08/24/2013   Spinal stenosis of lumbar region with neurogenic claudication 08/03/2013   GAD (generalized anxiety disorder) 09/12/2011   Chronic pain syndrome 08/08/2011   Severe episode of recurrent major depressive disorder (HCC) 08/08/2011   GERD (gastroesophageal reflux disease) 11/01/2009    Palliative Care Assessment & Plan   Patient Profile: 63 y.o. female  with past medical history of HTN, fibromyalgia, Nissen fundoplication, GERD, and emphysema who presented to [facility] on 01/10/2024 with chest pain, somnolence and hypoxia.   Found to have LL pneumonia, R secondary spontaneous pneumothorax, and concern for opiate toxidrome with ED course c/b worsening encephalopathy requiring invasive  mechanical ventilation transferred to [facility] for CCM evaluation.    11/28 esophogram negative. Repeat CT chest shows extravasation into right chest.   Today's Discussion: Reviewed chart and received update from nursing. Patient remains intubated. Daughter [name] at bedside. [name] shared the plan moving forward-- to see if patient can improve to be extubated in the next couple days. [name] shared that  if she did not do well with extubation the goal would be to make her comfortable. We discussed what comfort measures would look like for this patient.  Emotional support and therapeutic listening provided. Encouraged family to call PMT with questions or concerns. PMT will continue to support.  Recommendations/Plan: Continue DNR Time for outcomes Continued PMT support    Code Status:    Code Status Orders  (From admission, onward)           Start     Ordered   01/16/24 1115  Do not attempt resuscitation (DNR) Pre-Arrest Interventions Desired  (Code Status)  Continuous       Question Answer Comment  If pulseless and not breathing No CPR or chest compressions.   In Pre-Arrest Conditions (Patient Has Pulse and Is Breathing) May intubate, use advanced airway interventions and cardioversion/ACLS medications if appropriate or indicated. May transfer to ICU.   Consent: Discussion documented in EHR or advanced directives reviewed      01/16/24 1115         Extensive chart review has been completed prior to seeing the patient including labs, vital signs, imaging, progress/consult notes, orders, medications, and available advance directive documents.  Care plan was discussed with bedside RN and Dr. Jude  Time spent: 25 minutes  Thank you for allowing the Palliative Medicine Team to assist in the care of this patient.    Stephane CHRISTELLA Palin, NP  Please contact Palliative Medicine Team phone at [phone] for questions and concerns.

## 2024-01-18 NOTE — Progress Notes (Signed)
 INFECTIOUS DISEASE PROGRESS NOTE Date of Admission:  01/10/2024     ID: Renee Gutierrez is a 63 y.o. female with  sepsis  Principal Problem:   Incarcerated hiatal hernia with ulcer at cardia and perforation/leak Active Problems:   CKD (chronic kidney disease), stage II   Peptic ulcer within a sliding hiatal hernia   Septic shock (HCC)   Acute hypoxic respiratory failure (HCC)   Empyema (HCC)   Severe protein-calorie malnutrition   Chronic gastric ulcer (cardia) with perforation/leak   Cachexia   Severe chronic obstructive pulmonary disease (HCC)   Spontaneous pneumothorax s/p right chest tube   Malnutrition of moderate degree   VRE (vancomycin-resistant Enterococci) infection   Yeast infection   Subjective: 11/30- no fevers, wbc up some. Remains stable on vent, not on pressors. Having agitation and hypertension Not a lot of trach secretions  11/29- Found on imaging to have esophageal leak into R chest. Seen by CT and gen surgery and GI and considering transfer to tertiary care facility  no fevers, wbc decreased last check. Remains on dapto, ctx and flagyl  as well as micafungin   BCX 11/27 1/2 with enterococcus faecium.  11/22 with candida glabrata  11/22 mrsa nares pcr negative 11/22 tracheal aspirate cx few normal respiratory flora -- no staph aureus or pseudomonas 11/22 pleural fluid - Cx strep mitis (R penicillin; S ceftriaxone , levoflox), strep parasanguinis (I pcn; S ceftriaxone ), candida glabrata (sent to [facility] for further testing)      ROS  Unable to obtain  Medications:  Antibiotics Given (last 72 hours)     Date/Time Action Medication Dose Rate   01/16/24 0546 New Bag/Given   cefTRIAXone  (ROCEPHIN ) 2 g in sodium chloride  0.9 % 100 mL IVPB 2 g 200 mL/hr   01/16/24 0935 New Bag/Given   metroNIDAZOLE  (FLAGYL ) IVPB 500 mg 500 mg 100 mL/hr   01/16/24 2106 New Bag/Given   metroNIDAZOLE  (FLAGYL ) IVPB 500 mg 500 mg 100 mL/hr   01/17/24 0533 New Bag/Given    DAPTOmycin  (CUBICIN ) IVPB 500 mg/14mL premix 500 mg 100 mL/hr   01/17/24 9391 New Bag/Given   cefTRIAXone  (ROCEPHIN ) 2 g in sodium chloride  0.9 % 100 mL IVPB 2 g 200 mL/hr   01/17/24 0902 New Bag/Given   metroNIDAZOLE  (FLAGYL ) IVPB 500 mg 500 mg 100 mL/hr   01/17/24 2142 New Bag/Given   metroNIDAZOLE  (FLAGYL ) IVPB 500 mg 500 mg 100 mL/hr   01/18/24 0534 New Bag/Given   cefTRIAXone  (ROCEPHIN ) 2 g in sodium chloride  0.9 % 100 mL IVPB 2 g 200 mL/hr   01/18/24 0910 New Bag/Given   metroNIDAZOLE  (FLAGYL ) IVPB 500 mg 500 mg 100 mL/hr   01/18/24 1346 New Bag/Given   DAPTOmycin  (CUBICIN ) IVPB 500 mg/65mL premix 500 mg 100 mL/hr       arformoterol   15 mcg Nebulization BID   budesonide  (PULMICORT ) nebulizer solution  0.5 mg Nebulization BID   Chlorhexidine  Gluconate Cloth  6 each Topical Daily   heparin   5,000 Units Subcutaneous Q8H   nicotine   14 mg Transdermal Daily   mouth rinse  15 mL Mouth Rinse Q2H   pantoprazole  (PROTONIX ) IV  40 mg Intravenous Q12H   phenytoin  (DILANTIN ) IV  100 mg Intravenous Q12H   prednisoLONE  acetate  1 drop Left Eye Daily   revefenacin   175 mcg Nebulization Daily   sodium chloride  flush  10 mL Intrapleural Q8H    Objective: Vital signs in last 24 hours: Temp:  [97.3 F (36.3 C)-99.3 F (37.4 C)] 97.9 F (36.6  C) (11/30 1400) Pulse Rate:  [65-112] 81 (11/30 1400) Resp:  [12-40] 16 (11/30 1400) SpO2:  [97 %-100 %] 100 % (11/30 1400) Arterial Line BP: (89-245)/(36-109) 133/64 (11/30 1400) FiO2 (%):  [40 %] 40 % (11/30 1126) Weight:  [48.3 kg] 48.3 kg (11/30 0500) Physical Exam  Constitutional:  Intubated, sedated HENT: NGT in place Mouth/Throat: ETT in place Cardiovascular: Normal rate, regular rhythm and normal heart sounds. Pulmonary/Chest: mech breath sounds R chest tube with serous drainage Neck = supple, no nuchal rigidity Abdominal: Soft.  Lymphadenopathy: no cervical adenopathy. No axillary adenopathy Neurological: sedated R Fem triple line  and A line  Lab Results Recent Labs    01/16/24 0412 01/17/24 0437 01/18/24 0725 01/18/24 0839  WBC 21.3*  --   --  28.9*  HGB 8.8*  --   --  8.9*  HCT 28.9*  --   --  27.5*  NA 142 147* 148*  --   K 4.3 4.1 2.8*  --   CL 109 112* 114*  --   CO2 23 24 26   --   BUN 66* 63* 41*  --   CREATININE 1.28* 0.89 0.58  --     Microbiology: Results for orders placed or performed during the hospital encounter of 01/10/24  Gram stain     Status: None   Collection Time: 01/10/24  7:21 AM   Specimen: Pleura  Result Value Ref Range Status   Specimen Description PLEURAL  Final   Special Requests NONE  Final   Gram Stain   Final    BUDDING YEAST SEEN FEW GRAM POSITIVE COCCI IN CHAINS MODERATE WBC PRESENT,BOTH PMN AND MONONUCLEAR CYTOSPIN SMEAR PLEURAL Gram Stain Report Called to,Read Back By and Verified With: BELCHER @ 1125 ON [id number] BY HENDERSON L Performed at [facility], [address], [place], [place] [address]    Report Status 01/10/2024 FINAL  Final  Culture, body fluid w Gram Stain-bottle     Status: Abnormal   Collection Time: 01/10/24  7:21 AM   Specimen: Pleura  Result Value Ref Range Status   Specimen Description   Final    PLEURAL BOTTLES DRAWN AEROBIC AND ANAEROBIC Performed at [facility], [address], [place], [place] [address]    Special Requests   Final    10CC Performed at [facility], [address] [place], [place] [address]    Gram Stain   Final    IN BOTH AEROBIC AND ANAEROBIC BOTTLES GRAM POSITIVE COCCI BUDDING YEAST SEEN WBC PRESENT,BOTH PMN AND MONONUCLEAR CYTOSPIN SMEAR Gram Stain Report Called to,Read Back By and Verified With: KENZIE R. ON 01/10/2024 @1 :58PM BY T. HAMER  Performed at [facility], [address] [place], [place] [address]    Culture STREPTOCOCCUS PARASANGUINIS CANDIDA GLABRATA  (A)  Final   Report Status 01/13/2024 FINAL  Final   Organism ID, Bacteria STREPTOCOCCUS PARASANGUINIS  Final      Susceptibility    Streptococcus parasanguinis - MIC*    PENICILLIN 1 INTERMEDIATE Intermediate     CEFTRIAXONE  1 SENSITIVE Sensitive     LEVOFLOXACIN 1 SENSITIVE Sensitive     VANCOMYCIN 0.5 SENSITIVE Sensitive     * STREPTOCOCCUS PARASANGUINIS  Culture, blood (Routine X 2) w Reflex to ID Panel     Status: Abnormal (Preliminary result)   Collection Time: 01/10/24  8:43 AM   Specimen: Blood  Result Value Ref Range Status   Specimen Description   Final    RT CENTRAL LINE BOTTLES DRAWN AEROBIC  AND ANAEROBIC Performed at [facility], [address] [place], [place] [address]    Special Requests   Final    Blood Culture adequate volume Performed at [facility], [address], [place], [place] [address]    Culture  Setup Time (A)  Final    YEAST AEROBIC BOTTLE ONLY Gram Stain Report Called to,Read Back By and Verified With: A. CONTRERAS @WL  [phone], VIRAY,J CRITICAL RESULT CALLED TO, READ BACK BY AND VERIFIED WITH: PHARMD E JACKSON 01/15/2024 @ 0107 BY AB    Culture (A)  Final    CANDIDA GLABRATA Sent to [facility] for further susceptibility testing. Performed at [facility], [address] [place], [place] [address]    Report Status PENDING  Incomplete  Culture, blood (Routine X 2) w Reflex to ID Panel     Status: None   Collection Time: 01/10/24  8:43 AM   Specimen: Blood  Result Value Ref Range Status   Specimen Description AEROBIC BOTTLE ONLY RT FEMORAL  Final   Special Requests   Final    Blood Culture results may not be optimal due to an inadequate volume of blood received in culture bottles   Culture   Final    NO GROWTH 5 DAYS Performed at [facility], [address] [place], [place] [address]    Report Status 01/15/2024 FINAL  Final  Blood Culture ID Panel (Reflexed)     Status: Abnormal   Collection Time: 01/10/24  8:43 AM  Result Value Ref Range Status   Enterococcus faecalis NOT DETECTED NOT DETECTED Final   Enterococcus Faecium NOT DETECTED NOT DETECTED Final    Listeria monocytogenes NOT DETECTED NOT DETECTED Final   Staphylococcus species NOT DETECTED NOT DETECTED Final   Staphylococcus aureus (BCID) NOT DETECTED NOT DETECTED Final   Staphylococcus epidermidis NOT DETECTED NOT DETECTED Final   Staphylococcus lugdunensis NOT DETECTED NOT DETECTED Final   Streptococcus species NOT DETECTED NOT DETECTED Final   Streptococcus agalactiae NOT DETECTED NOT DETECTED Final   Streptococcus pneumoniae NOT DETECTED NOT DETECTED Final   Streptococcus pyogenes NOT DETECTED NOT DETECTED Final   A.calcoaceticus-baumannii NOT DETECTED NOT DETECTED Final   Bacteroides fragilis NOT DETECTED NOT DETECTED Final   Enterobacterales NOT DETECTED NOT DETECTED Final   Enterobacter cloacae complex NOT DETECTED NOT DETECTED Final   Escherichia coli NOT DETECTED NOT DETECTED Final   Klebsiella aerogenes NOT DETECTED NOT DETECTED Final   Klebsiella oxytoca NOT DETECTED NOT DETECTED Final   Klebsiella pneumoniae NOT DETECTED NOT DETECTED Final   Proteus species NOT DETECTED NOT DETECTED Final   Salmonella species NOT DETECTED NOT DETECTED Final   Serratia marcescens NOT DETECTED NOT DETECTED Final   Haemophilus influenzae NOT DETECTED NOT DETECTED Final   Neisseria meningitidis NOT DETECTED NOT DETECTED Final   Pseudomonas aeruginosa NOT DETECTED NOT DETECTED Final   Stenotrophomonas maltophilia NOT DETECTED NOT DETECTED Final   Candida albicans NOT DETECTED NOT DETECTED Final   Candida auris NOT DETECTED NOT DETECTED Final   Candida glabrata DETECTED (A) NOT DETECTED Final    Comment: CRITICAL RESULT CALLED TO, READ BACK BY AND VERIFIED WITH: PHARMD E JACKSON 01/15/2024 @ 0107 BY AB    Candida krusei NOT DETECTED NOT DETECTED Final   Candida parapsilosis NOT DETECTED NOT DETECTED Final   Candida tropicalis NOT DETECTED NOT DETECTED Final   Cryptococcus neoformans/gattii NOT DETECTED NOT DETECTED Final    Comment: Performed at [facility], [address]  [place],  [place] [address]  MRSA Next Gen by PCR, Nasal     Status: None   Collection Time: 01/10/24 12:11 PM   Specimen: Nasal Mucosa; Nasal Swab  Result Value Ref Range Status   MRSA by PCR Next Gen NOT DETECTED NOT DETECTED Final    Comment: (NOTE) The GeneXpert MRSA Assay (FDA approved for NASAL specimens only), is one component of a comprehensive MRSA colonization surveillance program. It is not intended to diagnose MRSA infection nor to guide or monitor treatment for MRSA infections. Test performance is not FDA approved in patients less than 54 years old. Performed at [facility], [address], [place], [place] [address]   Culture, Respiratory w Gram Stain (tracheal aspirate)     Status: None   Collection Time: 01/10/24 12:12 PM   Specimen: Tracheal Aspirate; Respiratory  Result Value Ref Range Status   Specimen Description   Final    TRACHEAL ASPIRATE Performed at [facility], [address] [place], [place] [address]    Special Requests   Final    NONE Performed at [facility], [address], [place], [place] [address]    Gram Stain   Final    RARE WBC PRESENT, PREDOMINANTLY PMN FEW GRAM POSITIVE COCCI    Culture   Final    FEW Normal respiratory flora-no Staph aureus or Pseudomonas seen Performed at [facility], [address] [place], [place] [address]    Report Status 01/12/2024 FINAL  Final  Body fluid culture w Gram Stain     Status: None (Preliminary result)   Collection Time: 01/10/24 12:19 PM   Specimen: Pleural Fluid  Result Value Ref Range Status   Specimen Description   Final    PLEURAL Performed at [facility], [address] [place], [place] [address]    Special Requests   Final    NONE Performed at [facility], [address] [place], [place] [address]    Gram Stain   Final    RARE WBC SEEN FEW GRAM POSITIVE COCCI RARE GRAM POSITIVE  RODS RARE YEAST    Culture   Final    FEW STREPTOCOCCUS MITIS/ORALIS FEW CANDIDA GLABRATA Sent to [facility] for further susceptibility testing. Performed at [facility], [address], [place], [place] [address]    Report Status PENDING  Incomplete   Organism ID, Bacteria STREPTOCOCCUS MITIS/ORALIS  Final      Susceptibility   Streptococcus mitis/oralis - MIC*    PENICILLIN >=8 RESISTANT Resistant     CEFTRIAXONE  <=0.12 SENSITIVE Sensitive     LEVOFLOXACIN 2 SENSITIVE Sensitive     VANCOMYCIN 0.5 SENSITIVE Sensitive     * FEW STREPTOCOCCUS MITIS/ORALIS  Fungus Culture With Stain     Status: Abnormal (Preliminary result)   Collection Time: 01/10/24 12:19 PM   Specimen: Pleural Fluid  Result Value Ref Range Status   Fungus Stain Final report (A)  Final    Comment: (NOTE) Performed At: [facility] [address] [place], [place] [id number] Jennette Shorter MD Ey:[phone]    Fungus (Mycology) Culture PENDING  Incomplete   Fungal Source PLEURAL  Final    Comment: Performed at [facility], [address], [place], [place] [address]  Fungus Culture Result     Status: Abnormal   Collection Time: 01/10/24 12:19 PM  Result Value Ref Range Status   Result 1 Yeast observed (A)  Final    Comment: (  NOTE) Performed At: [facility] [address] [place], [place] [id number] Jennette Shorter MD Ey:[id number]   Respiratory (~20 pathogens) panel by PCR     Status: None   Collection Time: 01/10/24  1:34 PM   Specimen: Nasopharyngeal Swab; Respiratory  Result Value Ref Range Status   Adenovirus NOT DETECTED NOT DETECTED Final   Coronavirus 229E NOT DETECTED NOT DETECTED Final    Comment: (NOTE) The Coronavirus on the Respiratory Panel, DOES NOT test for the novel  Coronavirus (2019 nCoV)    Coronavirus HKU1 NOT DETECTED NOT DETECTED Final   Coronavirus NL63 NOT DETECTED NOT DETECTED Final   Coronavirus OC43 NOT DETECTED NOT DETECTED Final    Metapneumovirus NOT DETECTED NOT DETECTED Final   Rhinovirus / Enterovirus NOT DETECTED NOT DETECTED Final   Influenza A NOT DETECTED NOT DETECTED Final   Influenza B NOT DETECTED NOT DETECTED Final   Parainfluenza Virus 1 NOT DETECTED NOT DETECTED Final   Parainfluenza Virus 2 NOT DETECTED NOT DETECTED Final   Parainfluenza Virus 3 NOT DETECTED NOT DETECTED Final   Parainfluenza Virus 4 NOT DETECTED NOT DETECTED Final   Respiratory Syncytial Virus NOT DETECTED NOT DETECTED Final   Bordetella pertussis NOT DETECTED NOT DETECTED Final   Bordetella Parapertussis NOT DETECTED NOT DETECTED Final   Chlamydophila pneumoniae NOT DETECTED NOT DETECTED Final   Mycoplasma pneumoniae NOT DETECTED NOT DETECTED Final    Comment: Performed at [facility], [address], [place], [place] [address]  Resp panel by RT-PCR (RSV, Flu A&B, Covid) Anterior Nasal Swab     Status: None   Collection Time: 01/10/24  1:34 PM   Specimen: Anterior Nasal Swab  Result Value Ref Range Status   SARS Coronavirus 2 by RT PCR NEGATIVE NEGATIVE Final    Comment: (NOTE) SARS-CoV-2 target nucleic acids are NOT DETECTED.  The SARS-CoV-2 RNA is generally detectable in upper respiratory specimens during the acute phase of infection. The lowest concentration of SARS-CoV-2 viral copies this assay can detect is 138 copies/mL. A negative result does not preclude SARS-Cov-2 infection and should not be used as the sole basis for treatment or other patient management decisions. A negative result may occur with  improper specimen collection/handling, submission of specimen other than nasopharyngeal swab, presence of viral mutation(s) within the areas targeted by this assay, and inadequate number of viral copies(<138 copies/mL). A negative result must be combined with clinical observations, patient history, and epidemiological information. The expected result is Negative.  Fact Sheet for Patients:   bloggercourse.com  Fact Sheet for Healthcare Providers:  seriousbroker.it  This test is no t yet approved or cleared by the [place]  FDA and  has been authorized for detection and/or diagnosis of SARS-CoV-2 by FDA under an Emergency Use Authorization (EUA). This EUA will remain  in effect (meaning this test can be used) for the duration of the COVID-19 declaration under Section 564(b)(1) of the Act, 21 U.S.C.section 360bbb-3(b)(1), unless the authorization is terminated  or revoked sooner.       Influenza A by PCR NEGATIVE NEGATIVE Final   Influenza B by PCR NEGATIVE NEGATIVE Final    Comment: (NOTE) The Xpert Xpress SARS-CoV-2/FLU/RSV plus assay is intended as an aid in the diagnosis of influenza from Nasopharyngeal swab specimens and should not be used as a sole basis for treatment. Nasal washings and aspirates are unacceptable for Xpert Xpress SARS-CoV-2/FLU/RSV testing.  Fact Sheet for Patients: bloggercourse.com  Fact Sheet for Healthcare Providers: seriousbroker.it  This test is  not yet approved or cleared by the [place]  FDA and has been authorized for detection and/or diagnosis of SARS-CoV-2 by FDA under an Emergency Use Authorization (EUA). This EUA will remain in effect (meaning this test can be used) for the duration of the COVID-19 declaration under Section 564(b)(1) of the Act, 21 U.S.C. section 360bbb-3(b)(1), unless the authorization is terminated or revoked.     Resp Syncytial Virus by PCR NEGATIVE NEGATIVE Final    Comment: (NOTE) Fact Sheet for Patients: bloggercourse.com  Fact Sheet for Healthcare Providers: seriousbroker.it  This test is not yet approved or cleared by the [place]  FDA and has been authorized for detection and/or diagnosis of SARS-CoV-2 by FDA under an Emergency Use  Authorization (EUA). This EUA will remain in effect (meaning this test can be used) for the duration of the COVID-19 declaration under Section 564(b)(1) of the Act, 21 U.S.C. section 360bbb-3(b)(1), unless the authorization is terminated or revoked.  Performed at [facility], [address] [place], [place] [address]   Yeast Susceptibilities     Status: None   Collection Time: 01/10/24  2:12 PM  Result Value Ref Range Status   SOURCE LAB 10504 C GLABRATA PLEURAL FLD SENSI  Corrected    Comment: Performed at [facility], [address], [place], [place] [address] CORRECTED ON 11/25 AT 0845: PREVIOUSLY REPORTED AS LAB 10504 C GLABRATA BLOOD SENSI    Organism ID, Yeast Preliminary report  Final    Comment: (NOTE) Specimen has been received and testing has been initiated. Performed At: [facility] [address] [place], [place] [id number] Jennette Shorter MD Ey:[id number]    Amphotericin B MIC PENDING  Incomplete   Anidulafungin MIC PENDING  Incomplete   Caspofungin MIC PENDING  Incomplete   Fluconazole Islt MIC PENDING  Incomplete   ISAVUCONAZOLE MIC PENDING  Incomplete   Itraconazole MIC PENDING  Incomplete   Micafungin  MIC PENDING  Incomplete   Posaconazole MIC PENDING  Incomplete   REZAFUNGIN MIC PENDING  Incomplete   Voriconazole MIC PENDING  Incomplete  Culture, blood (Routine X 2) w Reflex to ID Panel     Status: Abnormal (Preliminary result)   Collection Time: 01/15/24  8:46 PM   Specimen: BLOOD RIGHT FOREARM  Result Value Ref Range Status   Specimen Description   Final    BLOOD RIGHT FOREARM Performed at [facility], [address], [place], [place] [address]    Special Requests   Final    BOTTLES DRAWN AEROBIC ONLY Blood Culture results may not be optimal due to an inadequate volume of blood received in culture bottles Performed at [facility], [address], [place], [place] [address]    Culture  Setup  Time   Final    GRAM POSITIVE COCCI AEROBIC BOTTLE ONLY CRITICAL RESULT CALLED TO, READ BACK BY AND VERIFIED WITH: PHARMD E JACKSON 01/17/2024 @ 0429 BY AB    Culture (A)  Final    ENTEROCOCCUS FAECIUM SUSCEPTIBILITIES TO FOLLOW Performed at [facility], [address], [place], [place] [address]    Report Status PENDING  Incomplete  Culture, blood (Routine X 2) w Reflex to ID Panel     Status: None (Preliminary result)   Collection Time: 01/15/24  8:46 PM   Specimen: BLOOD RIGHT HAND  Result Value Ref Range Status   Specimen Description   Final    BLOOD RIGHT HAND Performed at [facility], [address] [place], [place] [address]    Special Requests   Final    BOTTLES DRAWN AEROBIC ONLY Blood Culture results may not be optimal due to an inadequate volume of blood received in culture bottles Performed at [facility], [address], [place], [place] [address]    Culture   Final    NO GROWTH 2 DAYS Performed at [facility], [address], [place], [place] [address]    Report Status PENDING  Incomplete  Blood Culture ID Panel (Reflexed)     Status: Abnormal   Collection Time: 01/15/24  8:46 PM  Result Value Ref Range Status   Enterococcus faecalis NOT DETECTED NOT DETECTED Final   Enterococcus Faecium DETECTED (A) NOT DETECTED Final    Comment: CRITICAL RESULT CALLED TO, READ BACK BY AND VERIFIED WITH: PHARMD E JACKSON 01/17/2024 @ 0429 BY AB    Listeria monocytogenes NOT DETECTED NOT DETECTED Final   Staphylococcus species NOT DETECTED NOT DETECTED Final   Staphylococcus aureus (BCID) NOT DETECTED NOT DETECTED Final   Staphylococcus epidermidis NOT DETECTED NOT DETECTED Final   Staphylococcus lugdunensis NOT DETECTED NOT DETECTED Final   Streptococcus species NOT DETECTED NOT DETECTED Final   Streptococcus agalactiae NOT DETECTED NOT DETECTED Final   Streptococcus pneumoniae NOT DETECTED NOT DETECTED Final   Streptococcus  pyogenes NOT DETECTED NOT DETECTED Final   A.calcoaceticus-baumannii NOT DETECTED NOT DETECTED Final   Bacteroides fragilis NOT DETECTED NOT DETECTED Final   Enterobacterales NOT DETECTED NOT DETECTED Final   Enterobacter cloacae complex NOT DETECTED NOT DETECTED Final   Escherichia coli NOT DETECTED NOT DETECTED Final   Klebsiella aerogenes NOT DETECTED NOT DETECTED Final   Klebsiella oxytoca NOT DETECTED NOT DETECTED Final   Klebsiella pneumoniae NOT DETECTED NOT DETECTED Final   Proteus species NOT DETECTED NOT DETECTED Final   Salmonella species NOT DETECTED NOT DETECTED Final   Serratia marcescens NOT DETECTED NOT DETECTED Final   Haemophilus influenzae NOT DETECTED NOT DETECTED Final   Neisseria meningitidis NOT DETECTED NOT DETECTED Final   Pseudomonas aeruginosa NOT DETECTED NOT DETECTED Final   Stenotrophomonas maltophilia NOT DETECTED NOT DETECTED Final   Candida albicans NOT DETECTED NOT DETECTED Final   Candida auris NOT DETECTED NOT DETECTED Final   Candida glabrata NOT DETECTED NOT DETECTED Final   Candida krusei NOT DETECTED NOT DETECTED Final   Candida parapsilosis NOT DETECTED NOT DETECTED Final   Candida tropicalis NOT DETECTED NOT DETECTED Final   Cryptococcus neoformans/gattii NOT DETECTED NOT DETECTED Final   Vancomycin resistance DETECTED (A) NOT DETECTED Final    Comment: CRITICAL RESULT CALLED TO, READ BACK BY AND VERIFIED WITH: PHARMD E JACKSON 01/17/2024 @ 0429 BY AB Performed at [facility], [address], [place], [place] [address]   Culture, blood (single) w Reflex to ID Panel     Status: None (Preliminary result)   Collection Time: 01/17/24  1:21 PM   Specimen: BLOOD RIGHT HAND  Result Value Ref Range Status   Specimen Description   Final    BLOOD RIGHT HAND Performed at [facility], [address] [place], [place] [address]    Special Requests   Final    BOTTLES DRAWN AEROBIC ONLY Blood Culture results may not be optimal due to an  inadequate volume of blood received in culture bottles Performed at [facility], [address] [place], [place] [address]    Culture   Final    NO GROWTH <  24 HOURS Performed at [facility], [address], [place], [place] [address]    Report Status PENDING  Incomplete     Studies/Results: DG Chest Port 1 View Result Date: 01/18/2024 CLINICAL DATA:  Acute respiratory failure EXAM: PORTABLE CHEST 1 VIEW COMPARISON:  Chest radiograph dated 01/17/2024 FINDINGS: Lines/tubes: Endotracheal tube tip projects 3.5 cm above the carina. Gastric/enteric tube tip projects over the stomach. Right basilar pleural catheter in-situ. Lungs: Mildly low right lung volumes. Increased patchy right lower lung opacity. Minimal patchy left basilar opacity. Pleura: Small right pleural effusion.  No pneumothorax. Heart/mediastinum: Enlarged cardiomediastinal silhouette is likely projectional. Bones: Cervical spinal fixation hardware appears intact. IMPRESSION: 1. Increased patchy right lower lung opacity, which may represent atelectasis, aspiration, or pneumonia. 2. Small right pleural effusion with right basilar pleural catheter in-situ. No pneumothorax. Electronically Signed   By: Limin  Xu M.D.   On: 01/18/2024 11:39   DG Chest Port 1 View Result Date: 01/17/2024 CLINICAL DATA:  Acute respiratory failure. Follow-up pneumothorax. On ventilator. EXAM: PORTABLE CHEST 1 VIEW COMPARISON:  01/16/2024 FINDINGS: Patient is significantly rotated to the right which limits evaluation. Endotracheal tube remains in appropriate position. A right-sided pleural catheter is seen overlying the right lung base. No definite pneumothorax is seen on today's exam which is limited by positioning. Decreased infiltrate or atelectasis is seen in the right lung base. Left lung remains clear. Heart size within normal limits. IMPRESSION: Limited exam due to positioning. No definite pneumothorax visualized on today's exam.  Decreased right basilar infiltrate or atelectasis. Electronically Signed   By: Norleen DELENA Kil M.D.   On: 01/17/2024 05:40   DG Abd 1 View Result Date: 01/16/2024 EXAM: 1 VIEW XRAY OF THE ABDOMEN 01/16/2024 07:29:00 PM COMPARISON: 01/10/2024 CLINICAL HISTORY: Encounter for imaging study to confirm nasogastric (NG) tube placement. FINDINGS: LINES, TUBES AND DEVICES: Enteric tube tip and side port in satisfactory position. Right chest tube noted. Right femoral line noted. BOWEL: Nonobstructive bowel gas pattern. SOFT TISSUES: Cholecystectomy clips noted. No opaque urinary calculi. BONES: Levoscoliosis of the lumbar spine. No acute osseous abnormality. PLEURAL SPACES: Right pleural effusion. IMPRESSION: 1. Enteric tube tip and side port in satisfactory position. Electronically signed by: Norman Gatlin MD 01/16/2024 07:37 PM EST RP Workstation: [id number]    Assessment/Plan: 63 yo female with empysema, gerd, fibromyalgia, [facility] with ams, chest pain, hypoxemic resp failure in setting lobar pna and right sided pleural effusion/pneumothorax, subsequently developed septic shock/ards in setting empyema/fungemia    Found to have esophageal leak with polymicrobial empyema s/p chest tube, candida glabrata and enterococcal bacteremia VRE. 11/30- stable but wbc up a little. CT with serous drainage. PICC team could not find vein to place picc so still have R femoral TLC and A line.   Rec Cont dapto, ctx flagyl  as well as micafungin . Repeat bcx pending If resp status worsens, or  increasing trach secretions consider trach asp culture as the daptomycin will not cover pulm parenychmal infeciton.   Dr Fleeta dam to assume care tomorrow.  Thank you very much for the consult. Will follow with you.  Renee Gutierrez   01/18/2024, 2:29 PM

## 2024-01-18 NOTE — Progress Notes (Signed)
 NAME:  Renee Gutierrez, MRN:  [id number], DOB:  [birth year], LOS: 8 ADMISSION DATE:  01/10/2024, CONSULTATION DATE:  @TODAY @ REFERRING MD:  [facility], CHIEF COMPLAINT:  acute hypoxic respiratory failure   History of Present Illness:  Renee Gutierrez is a 63 y/o F with a PMH significant for HTN, fibromyalgia, GERD, emphysema who presented to [facility] for severe chest pain, somnolence, and hypoxia found to have RLL pneumonia, R secondary spontaneous pneumothorax, and concern for opiate toxidrome with ED course c/b worsening encephalopathy requiring invasive mechanical ventilation transferred to [facility] for CCM evaluation.  Per ED notes, the patient presented with somnolence, hypoxia, hypotension. Initial concern for opiate toxidrome. O2 SATS 69%. She was given small dose of narcan  with decent response, more alert and breathing, O2 sats improved. Patient in more pain. CTA chest performed and showed RLL consolidation, R pleural effusion, and pneumothorax. A R pigtail chest tube was placed. The patient initially required NE and request was made to transfer patient to ICU at [facility] or [facility]. She was also placed on a narcan  drip for opiate toxidrome.  On 11/22 AM, ED provider switched and patient had a respiratory decompensation, increasingly agitated, pulling on O2 and Ivs, more hypoxic. Decision was made to intubate patient and a femoral central line was placed. The patient was slated to come to [facility] for ICU level care.  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 11/23 lytics x 1 11/24 lytics x 2 11/25 extubated but reintubated by evening due to pigtail blockage & RT pneumo, flushed pigtail and removal of fibrinous clot 11/26 lytics #3 11/27 RT fem A -line >>, RT pigtail replaced>> 1.7 L of purulent fluid 11/28 Developed A-fib RVR overnight , failed Lopressor  and Cardizem, Amio drip added 11/28 esophagram negative but repeat CT chest after contrast shows extravasation into right chest, T CTS consulted 11/29 General Surgery, GI consultation, off pressors, hypertensive with agitation  Interim History / Subjective:   Critically ill, intubated Remains on amiodarone drip Hypertensive with breakthrough agitation in spite of increasing Precedex and Dilaudid  drips Afebrile Pigtail drainage has decreased 180 cc  Lines/Tubes: R pigtail CT, ETT, NGT, R  Fem CVC, PIV, Foley  Objective    Blood pressure (!) 152/60, pulse 74, temperature 98.1 F (36.7 C), resp. rate 18, height 4' 11 (1.499 m), weight 48.3 kg, SpO2 98%.    Vent Mode: PRVC FiO2 (%):  [40 %] 40 % Set Rate:  [24 bmp] 24 bmp Vt Set:  [340 mL] 340 mL PEEP:  [5 cmH20] 5 cmH20 Pressure Support:  [10 cmH20] 10  cmH20 Plateau Pressure:  [12 cmH20-20 cmH20] 15 cmH20   Intake/Output Summary (Last 24 hours) at 01/18/2024 0925 Last data filed at 01/18/2024 9157 Gross per 24 hour  Intake 2319.91 ml  Output 1959 ml  Net 360.91 ml   Filed Weights   01/16/24 0500 01/17/24 0456 01/18/24 0500  Weight: 48.4 kg 47.9 kg 48.3 kg    Examination: General: chronically ill appearing, intubated,  Eyes: hazy left pupil opacity, anisocoria, right 2 mm reactive to light ENT: intubated Skin: warm, intact, no rashes Neck: JVD flat CV: S1-S2 tacky, sinus rhythm on monitor Resp: Decreased breath sounds on right chest tube draining minimal clear fluid Abdom: soft, nontender, nondistended, no hepatosplenomegaly Neuro: Agitated nonspecific, moves all 4 extremity, RASS +2 to -1  Labs show persistent mild hypernatremia, hypokalemia and hypophosphatemia, increased leukocytosis and thrombocytopenia Chest x-ray 11/30 shows minimal right effusion and clearing of infiltrates  Resolved problem list   #High anion gap metabolic acidosis # RT Secondary Spontaneous Pneumothorax    Assessment and Plan   She has a history of Nissen fundoplication at [facility] in 2011. She has a paraesophageal hernia with proximal gastric ulcer status post EGD x 3 Last EGD 11/17 showed Nissen from duplication wrap intact, 3 cm paraesophageal hernia, nonbleeding gastric ulcer with clean base, biopsied Imaging studies from 11/28 suggest perforation likely in the gastric part of the wrap, less likely from the esophagus , with low volume leak, causing right empyema and septic shock due to polymicrobial bacteremia/candidemia  Currently shock is resolved and oxygenation has improved  GI, T CTS and general surgery consultations appreciated  #Acute Hypoxic Respiratory Failure: likely due to aspiration, empyema, R pneumothorax. Reintubated 11/25 due to right pneumothorax/pigtail blockage - Low tidal volume ventilation - Lowest FiO2 to keep SPO2 > 90%  and PaO2 > 65 mmHg , drop PEEP to 5 - Daily SAT/SBT as tolerated  - Triple therapy nebs - Tracheobronchial toilet   #Recurrent empyema status post lytics x3, pigtail replaced 11/27 - Daily CXR - CT to - 20 suction - flush pigtail every shift   # Septic shock:  due to fungemia, VRE bacteremia noted # Aspiration pneumonia #Empyema -strep mitis and Candida glabrata - Added daptomycin 11/29 >> - Continue Ceftriaxone  +Flagyl  - Continue Micafungin  since 11/22 - Steroids dc'd  # Mild protein calorie nutrition:  Intractable nausea and vomiting with recurrent hospital admissions -seen by GI  11/18 and plan was to follow-up with general surgery/hernia specialist as outpatient at  [facility] for paraesophageal hernia   -Hold tube feeds, use IV meds only, at some point can consider postpyloric feeding -Will need TPN, await repeat blood cultures to show that bacteremia/fungemia has cleared, then can obtain new IJ access and start TNA - PPI q 12h  #AKI: likely pre-renal due to shock state; resolved again - Avoid Nephrotoxins - Strict I/Os - Foley  - Hypokalemia and hypophosphatemia will be aggressively repleted  #VTE ppx: Heparin  subQ TID  #Acute Metabolic Encephalopathy: likely due to septic shock, uremia etc... CT head negative for acute pathology Chronic opiate use, some  concern for withdrawal  - Using Dilaudid  drip, goal RASS 0 to -1  -Precedex nonresponder , will change to propofol  and titrate- -Versed  for breakthrough   Disposition: ICU appropriate for vent support and vasopressors Goals of care -see IPAL note 11/28, no CPR issued, she would not want tracheostomy but all other forms of resuscitation requested  - Family does not want to proceed with surgery at this point after explaining the procedure involved -Offered transfer to a tertiary facility, but family does not want to pursue, they would like her to be closer to home - Discussed recommendations with daughter in  detail 11/30, current goal would be to see if we can get her to extubate over the next 2 to 3 days.  If so we would push forward with replacing central line and parenteral nutrition to see if this leak will resolve.  If on the other and we do not make progress then they would be agreeable to comfort care measures   Labs   CBC: Recent Labs  Lab 01/13/24 0530 01/14/24 0600 01/15/24 0446 01/16/24 0412 01/18/24 0839  WBC 10.2 19.6* 36.9* 21.3* 28.9*  NEUTROABS 8.7* 17.3* 34.7*  --   --   HGB 7.9* 10.6* 10.8* 8.8* 8.9*  HCT 25.2* 34.0* 36.1 28.9* 27.5*  MCV 83.2 84.8 88.0 87.0 79.9*  PLT 196 242 226 118* 123*    Basic Metabolic Panel: Recent Labs  Lab 01/14/24 0600 01/14/24 1745 01/15/24 0446 01/15/24 2250 01/16/24 0412 01/17/24 0437 01/18/24 0725  NA 142   < > 140 142 142 147* 148*  K 4.0   < > 4.9 4.3 4.3 4.1 2.8*  CL 111   < > 109 109 109 112* 114*  CO2 23   < > 23 23 23 24 26   GLUCOSE 112*   < > 209* 119* 148* 117* 125*  BUN 36*   < > 46* 68* 66* 63* 41*  CREATININE 0.79   < > 1.21* 1.45* 1.28* 0.89 0.58  CALCIUM  9.7   < > 9.3 9.6 9.5 9.9 9.5  MG 2.5*  --  2.6* 2.6* 2.5* 2.5* 2.0  PHOS 2.6  --  4.7*  --  3.9 4.2 1.4*   < > = values in this interval not displayed.   GFR: Estimated Creatinine Clearance: 48.5 mL/min (by C-G formula based on SCr of 0.58 mg/dL). Recent Labs  Lab 01/14/24 0250 01/14/24 0559 01/14/24 0600 01/15/24 0446 01/16/24 0412 01/18/24 0839  WBC  --   --  19.6* 36.9* 21.3* 28.9*  LATICACIDVEN 1.3 1.6  --   --   --   --     Liver Function Tests: No results for input(s): AST, ALT, ALKPHOS, BILITOT, PROT, ALBUMIN  in the last 168 hours.  No results for input(s): LIPASE, AMYLASE in the last 168 hours.  No results for input(s): AMMONIA in the last 168 hours.  ABG    Component Value Date/Time   PHART 7.26 (L) 01/16/2024 0735   PCO2ART 53 (H) 01/16/2024 0735   PO2ART 118 (H) 01/16/2024 0735   HCO3 23.8 01/16/2024 0735    TCO2 21 (L) 01/03/2024 1336   ACIDBASEDEF 3.5 (H) 01/16/2024 0735   O2SAT 99.4 01/16/2024 0735     Coagulation Profile: No results for input(s): INR, PROTIME in the last 168 hours.   Cardiac Enzymes: Recent Labs  Lab 01/18/24 0725  CKTOTAL 78    HbA1C: No results found for: HGBA1C  CBG: Recent Labs  Lab 01/17/24 1541 01/17/24 2059 01/18/24  0001 01/18/24 0512 01/18/24 0815  GLUCAP 141* 136* 121* 127* 118*     My independent critical care time was 43 m  Harden Staff MD. FCCP. [facility] Pager : [phone]  If no response to pager , please call [phone] until 7 pm After 7:00 pm call Elink  [phone]   01/18/2024

## 2024-01-18 NOTE — Plan of Care (Signed)
  Problem: Clinical Measurements: Goal: Diagnostic test results will improve Outcome: Progressing Goal: Respiratory complications will improve Outcome: Progressing Goal: Cardiovascular complication will be avoided Outcome: Progressing   Problem: Elimination: Goal: Will not experience complications related to bowel motility Outcome: Progressing Goal: Will not experience complications related to urinary retention Outcome: Progressing   Problem: Nutrition: Goal: Adequate nutrition will be maintained Outcome: Not Progressing   Problem: Coping: Goal: Level of anxiety will decrease Outcome: Not Progressing   Problem: Safety: Goal: Ability to remain free from injury will improve Outcome: Not Progressing

## 2024-01-18 NOTE — Progress Notes (Addendum)
 01/18/2024  Renee Gutierrez [id number] 1960-04-11  CARE TEAM: PCP: Patient, No Pcp Per  Outpatient Care Team: Patient Care Team: Patient, No Pcp Per as PCP - General (General Practice) Manfred Driver, DO as Consulting Physician (Internal Medicine)  Inpatient Treatment Team: Treatment Team:  Jude Harden GAILS, MD Pccm, Md, MD Ccs, Md, MD Dannette Stephane HERO, NP Bullins, Powell PARAS, RN Mellow, Waddell CROME, RN Black, Delon SAUNDERS, NT Duwayne Angeline NOVAK, RN   Problem List:   Principal Problem:   Incarcerated hiatal hernia with ulcer at cardia and perforation/leak Active Problems:   Severe protein-calorie malnutrition   Severe chronic obstructive pulmonary disease (HCC)   CKD (chronic kidney disease), stage II   Peptic ulcer within a sliding hiatal hernia   Septic shock (HCC)   Acute hypoxic respiratory failure (HCC)   Empyema (HCC)   Chronic gastric ulcer (cardia) with perforation/leak   Cachexia   Spontaneous pneumothorax s/p right chest tube   * No surgery found *      Assessment [facility] Stay = 8 days)      History of laparoscopic Nissen fundoplication at [facility]  by thoracic surgery in 2011.  Hiatal hernia with chronic proximal gastric ulcer status post EGD x3 and biopsying  Probable delayed perforation of chronic ulcer at cardia of stomach within posterior part of wrap.  Hiatal hernia wrap herniation into mediastinum with low volume leak into right chest    Plan:  This appears to be a low volume small leak seen only on delayed images.  Patient more agitated and moving around.  Requiring need for extra sedation.  Not on pressors.  That argues against any severe septic shock.  Continue internal drainage with orogastric tube in esophagus.  Continue external drainage with right-sided chest tube.   Effluent does not look purulent nor concerning.  However growing numerous organisms.  Output seems to be tapering off which is a hopeful sign that it is  under better control.    Send for amylase to see if she has a persistent leak since it was subtle and only seen on delayed images  Antibiotics & cultures per critical care/medicine/infectious disease.  I think her biggest risk factor right now is malnutrition.  Not safe for enteral feeds.  Would start TPN now.  If these conservative measures fail (which is likely) at some point she may need a more aggressive intervention than bowel rest internal/external drainage.   To fix this would require an operation transabdominally:  Reduce hiatal hernia out of mediastinum back into abdomen, takedown of wrap.  If esophageal leak, see if he can primary repair with protective redo fundoplication or may need intercostal muscle flap to protect the repair.   If gastric as suspected, hopefully could do suture repair or more likely omental Graham patching at this point to plug it.    Close the hiatal hernia defect  Draining G-tube.    Feeding J-tube.    That would be a long surgery.  Given the fact that she is severely deconditioned, malnourished, intubated on a ventilator, and this has been going on for a while; operative risk extremely high at this moment.  She is not in shape to handle that right now.  Likely any intervention or action at this moment would fall apart.  A less risky but possible way to address this is endoscopic treatment.  See if it could be clipped or more aggressively controlled.  Do not know if lower esophageal/wrap stenting would be helpful or  not.   Endoscopically place a PEG G-tube for drainage and perhaps switch to GJ for feeding through the jejunal port.  That is the preference of cardiothoracic surgery Dr. Daniel.  I would lean towards doing that first given how deconditioned she is.  I think that may be a reasonable compromise if things do not close sooner.  [facility] in town not comfortable managing that.  Perhaps could see if Dr. Wilhelmenia with [facility] could consider it in the future.  My instinct is I would wait a week to see if nutrition is better before attempting any intervention that may fail now since she is severely malnourished.  Seems like she was relatively independent and active but she is severely deconditioned now.  Reasonable to have discussions of goals of care.  Defer to critical care if palliative care needs to be involved yet.  Concerned by nursing that she is having a more focal exam.  With her agitation this all could be just ICU psychosis/metabolic encephalopathy.  Defer need for CAT scan or MRI to rule out stroke or other concerns.  I updated the patient's status to the patient's ICU team.  Recommendations were made.  Questions were answered.  They expressed understanding & appreciation.  -Disposition:  Cardiothoracic surgery, [facility], and I feel this patient would be better managed in a multidisciplinary center where there can have thoracic surgery, forgot/general surgery, interventional gastroenterology.  There have been plans for her to see [facility] anyway for her recurrent hiatal hernia.  I would lean towards sending her that way.     General surgery will follow more peripherally at this point.  Again strongly recommend transfer where all specialties can be available to handle this complicated situation.   I reviewed nursing notes, Consultant critical care, Gastroenterology, etc. notes, last 24 h vitals and pain scores, last 48 h intake and output, last 24 h labs and trends, and last 24 h imaging results.  I have reviewed this patient's available data, including medical history, events of note, test results, etc as part of my evaluation.   A significant portion of that time was spent in counseling. Care during the described time interval was provided by me.  This care required high  level of medical decision making.  01/18/2024    Subjective: (Chief  complaint)  Patient intubated on vent.  Not on pressors.  On high-dose Precedex and still agitated and restless.  Nursing worried about focal deficits.  Objective:  Vital signs:  Vitals:   01/18/24 0400 01/18/24 0500 01/18/24 0600 01/18/24 0750  BP:      Pulse: 68 79 74   Resp: (!) 24 18 18    Temp: 98.8 F (37.1 C) 98.4 F (36.9 C) 98.1 F (36.7 C)   TempSrc: Bladder     SpO2: 99% 100% 100% 98%  Weight:  48.3 kg    Height:        Last BM Date : 01/17/24  Intake/Output   Yesterday:  11/29 0701 - 11/30 0700 In: 2211.5 [I.V.:1778.4; IV Piggyback:403.1] Out: 8040 [Urine:1425; Emesis/NG output:50; Stool:300; Chest Tube:184] This shift:  Total I/O In: 44.5 [I.V.:44.5] Out: -   Bowel function:  Flatus: No  BM:  No  Drain:    Nasogastric tube thinly bilious Pigtail right sided chest tube with clear light yellow serous drainage.  No purulence.  Not bilious.   Physical Exam:  General: Patient on vent restless.  Moving upper extremity arms.  Wearing mitts.  Some distress.  moderate acute distress Eyes: PERRL, normal EOM.  Sclera clear.  No icterus Neuro: Moving both upper extremities.  Not particular focal exam to me but nursing raises concerns of more paresis on left side. Lymph: No head/neck/groin lymphadenopathy Psych: Patient is for some delirium.  Oriented x 0 HENT: Normocephalic, Mucus membranes moist.  No thrush Neck: Supple, No tracheal deviation.  No obvious thyromegaly Chest: Chest tube in right spot.  No pain to chest wall compression.  Good respiratory excursion.  No audible wheezing CV:  Pulses intact.  Regular rhythm.  No major extremity edema MS: Normal AROM mjr joints.  No obvious deformity  Abdomen: Soft.  Mildy distended.  Nontender.  No evidence of peritonitis.  No incarcerated hernias.  Ext:  No deformity.  No mjr edema.  No cyanosis Skin: No petechiae / purpurea.  No major sores.  Warm and dry    Results:   Cultures: Recent Results  (from the past 720 hours)  Gram stain     Status: None   Collection Time: 01/10/24  7:21 AM   Specimen: Pleura  Result Value Ref Range Status   Specimen Description PLEURAL  Final   Special Requests NONE  Final   Gram Stain   Final    BUDDING YEAST SEEN FEW GRAM POSITIVE COCCI IN CHAINS MODERATE WBC PRESENT,BOTH PMN AND MONONUCLEAR CYTOSPIN SMEAR PLEURAL Gram Stain Report Called to,Read Back By and Verified With: BELCHER @ 1125 ON [id number] BY HENDERSON L Performed at [facility], [address] [place], [place] [address]    Report Status 01/10/2024 FINAL  Final  Culture, body fluid w Gram Stain-bottle     Status: Abnormal   Collection Time: 01/10/24  7:21 AM   Specimen: Pleura  Result Value Ref Range Status   Specimen Description   Final    PLEURAL BOTTLES DRAWN AEROBIC AND ANAEROBIC Performed at [facility], [address] [place], [place] [address]    Special Requests   Final    10CC Performed at [facility], [address], [place], [place] [address]    Gram Stain   Final    IN BOTH AEROBIC AND ANAEROBIC BOTTLES GRAM POSITIVE COCCI BUDDING YEAST SEEN WBC PRESENT,BOTH PMN AND MONONUCLEAR CYTOSPIN SMEAR Gram Stain Report Called to,Read Back By and Verified With: KENZIE R. ON 01/10/2024 @1 :58PM BY IVAR ECHEVARIA  Performed at [facility], [address] [place], [place] [address]    Culture STREPTOCOCCUS PARASANGUINIS CANDIDA GLABRATA  (A)  Final   Report Status 01/13/2024 FINAL  Final   Organism ID, Bacteria STREPTOCOCCUS PARASANGUINIS  Final      Susceptibility   Streptococcus parasanguinis - MIC*    PENICILLIN 1 INTERMEDIATE Intermediate     CEFTRIAXONE  1 SENSITIVE Sensitive     LEVOFLOXACIN 1 SENSITIVE Sensitive     VANCOMYCIN 0.5 SENSITIVE Sensitive     * STREPTOCOCCUS PARASANGUINIS  Culture, blood (Routine X 2) w Reflex to ID Panel     Status: Abnormal (Preliminary result)   Collection Time: 01/10/24  8:43 AM   Specimen: Blood  Result Value Ref Range Status    Specimen Description   Final    RT CENTRAL LINE BOTTLES DRAWN AEROBIC AND ANAEROBIC Performed at [facility], [address] [place], [place] [address]    Special Requests   Final    Blood Culture adequate volume Performed at [facility], [address] [place], [place] [address]    Culture  Setup Time (A)  Final  YEAST AEROBIC BOTTLE ONLY Gram Stain Report Called to,Read Back By and Verified With: A. CONTRERAS @WL  [id number] [id number], VIRAY,J CRITICAL RESULT CALLED TO, READ BACK BY AND VERIFIED WITH: PHARMD E JACKSON 01/15/2024 @ 0107 BY AB    Culture (A)  Final    CANDIDA GLABRATA Sent to [facility] for further susceptibility testing. Performed at [facility], [address] [place], [place] [address]    Report Status PENDING  Incomplete  Culture, blood (Routine X 2) w Reflex to ID Panel     Status: None   Collection Time: 01/10/24  8:43 AM   Specimen: Blood  Result Value Ref Range Status   Specimen Description AEROBIC BOTTLE ONLY RT FEMORAL  Final   Special Requests   Final    Blood Culture results may not be optimal due to an inadequate volume of blood received in culture bottles   Culture   Final    NO GROWTH 5 DAYS Performed at [facility], [address], [place], [place] [address]    Report Status 01/15/2024 FINAL  Final  Blood Culture ID Panel (Reflexed)     Status: Abnormal   Collection Time: 01/10/24  8:43 AM  Result Value Ref Range Status   Enterococcus faecalis NOT DETECTED NOT DETECTED Final   Enterococcus Faecium NOT DETECTED NOT DETECTED Final   Listeria monocytogenes NOT DETECTED NOT DETECTED Final   Staphylococcus species NOT DETECTED NOT DETECTED Final   Staphylococcus aureus (BCID) NOT DETECTED NOT DETECTED Final   Staphylococcus epidermidis NOT DETECTED NOT DETECTED Final   Staphylococcus lugdunensis NOT DETECTED NOT DETECTED Final   Streptococcus species NOT DETECTED NOT DETECTED Final   Streptococcus agalactiae NOT DETECTED NOT DETECTED Final    Streptococcus pneumoniae NOT DETECTED NOT DETECTED Final   Streptococcus pyogenes NOT DETECTED NOT DETECTED Final   A.calcoaceticus-baumannii NOT DETECTED NOT DETECTED Final   Bacteroides fragilis NOT DETECTED NOT DETECTED Final   Enterobacterales NOT DETECTED NOT DETECTED Final   Enterobacter cloacae complex NOT DETECTED NOT DETECTED Final   Escherichia coli NOT DETECTED NOT DETECTED Final   Klebsiella aerogenes NOT DETECTED NOT DETECTED Final   Klebsiella oxytoca NOT DETECTED NOT DETECTED Final   Klebsiella pneumoniae NOT DETECTED NOT DETECTED Final   Proteus species NOT DETECTED NOT DETECTED Final   Salmonella species NOT DETECTED NOT DETECTED Final   Serratia marcescens NOT DETECTED NOT DETECTED Final   Haemophilus influenzae NOT DETECTED NOT DETECTED Final   Neisseria meningitidis NOT DETECTED NOT DETECTED Final   Pseudomonas aeruginosa NOT DETECTED NOT DETECTED Final   Stenotrophomonas maltophilia NOT DETECTED NOT DETECTED Final   Candida albicans NOT DETECTED NOT DETECTED Final   Candida auris NOT DETECTED NOT DETECTED Final   Candida glabrata DETECTED (A) NOT DETECTED Final    Comment: CRITICAL RESULT CALLED TO, READ BACK BY AND VERIFIED WITH: PHARMD E JACKSON 01/15/2024 @ 0107 BY AB    Candida krusei NOT DETECTED NOT DETECTED Final   Candida parapsilosis NOT DETECTED NOT DETECTED Final   Candida tropicalis NOT DETECTED NOT DETECTED Final   Cryptococcus neoformans/gattii NOT DETECTED NOT DETECTED Final    Comment: Performed at [facility], [address], [place], [place] [address]  MRSA Next Gen by PCR, Nasal     Status: None   Collection Time: 01/10/24 12:11 PM   Specimen: Nasal Mucosa; Nasal Swab  Result Value Ref Range Status   MRSA by PCR Next Gen NOT DETECTED NOT DETECTED Final    Comment: (NOTE) The  GeneXpert MRSA Assay (FDA approved for NASAL specimens only), is one component of a comprehensive MRSA colonization surveillance program. It is not intended to  diagnose MRSA infection nor to guide or monitor treatment for MRSA infections. Test performance is not FDA approved in patients less than 79 years old. Performed at [facility], [address] [place], [place] [address]   Culture, Respiratory w Gram Stain (tracheal aspirate)     Status: None   Collection Time: 01/10/24 12:12 PM   Specimen: Tracheal Aspirate; Respiratory  Result Value Ref Range Status   Specimen Description   Final    TRACHEAL ASPIRATE Performed at [facility], [address] [place], [place] [address]    Special Requests   Final    NONE Performed at [facility], [address] [place], [place] [address]    Gram Stain   Final    RARE WBC PRESENT, PREDOMINANTLY PMN FEW GRAM POSITIVE COCCI    Culture   Final    FEW Normal respiratory flora-no Staph aureus or Pseudomonas seen Performed at [facility], [address] [place], [place] [address]    Report Status 01/12/2024 FINAL  Final  Body fluid culture w Gram Stain     Status: None (Preliminary result)   Collection Time: 01/10/24 12:19 PM   Specimen: Pleural Fluid  Result Value Ref Range Status   Specimen Description   Final    PLEURAL Performed at [facility], [address], [place], [place] [address]    Special Requests   Final    NONE Performed at [facility], [address] [place], [place] [address]    Gram Stain   Final    RARE WBC SEEN FEW GRAM POSITIVE COCCI RARE GRAM POSITIVE RODS RARE YEAST    Culture   Final    FEW STREPTOCOCCUS MITIS/ORALIS FEW CANDIDA GLABRATA Sent to [facility] for further susceptibility testing. Performed at [facility], [address], [place], [place] [address]    Report Status PENDING  Incomplete   Organism ID, Bacteria STREPTOCOCCUS MITIS/ORALIS  Final      Susceptibility   Streptococcus mitis/oralis - MIC*    PENICILLIN >=8 RESISTANT Resistant      CEFTRIAXONE  <=0.12 SENSITIVE Sensitive     LEVOFLOXACIN 2 SENSITIVE Sensitive     VANCOMYCIN 0.5 SENSITIVE Sensitive     * FEW STREPTOCOCCUS MITIS/ORALIS  Fungus Culture With Stain     Status: Abnormal (Preliminary result)   Collection Time: 01/10/24 12:19 PM   Specimen: Pleural Fluid  Result Value Ref Range Status   Fungus Stain Final report (A)  Final    Comment: (NOTE) Performed At: [facility] [address] [place], [place] [id number] Jennette Shorter MD Ey:[id number]    Fungus (Mycology) Culture PENDING  Incomplete   Fungal Source PLEURAL  Final    Comment: Performed at [facility], [address] [place], [place] [address]  Fungus Culture Result     Status: Abnormal   Collection Time: 01/10/24 12:19 PM  Result Value Ref Range Status   Result 1 Yeast observed (A)  Final    Comment: (NOTE) Performed At: [facility] [address] [place], [place] [id number] Jennette Shorter MD Ey:[id number]   Respiratory (~20 pathogens) panel by PCR     Status: None   Collection Time: 01/10/24  1:34 PM   Specimen: Nasopharyngeal Swab; Respiratory  Result Value Ref Range Status  Adenovirus NOT DETECTED NOT DETECTED Final   Coronavirus 229E NOT DETECTED NOT DETECTED Final    Comment: (NOTE) The Coronavirus on the Respiratory Panel, DOES NOT test for the novel  Coronavirus (2019 nCoV)    Coronavirus HKU1 NOT DETECTED NOT DETECTED Final   Coronavirus NL63 NOT DETECTED NOT DETECTED Final   Coronavirus OC43 NOT DETECTED NOT DETECTED Final   Metapneumovirus NOT DETECTED NOT DETECTED Final   Rhinovirus / Enterovirus NOT DETECTED NOT DETECTED Final   Influenza A NOT DETECTED NOT DETECTED Final   Influenza B NOT DETECTED NOT DETECTED Final   Parainfluenza Virus 1 NOT DETECTED NOT DETECTED Final   Parainfluenza Virus 2 NOT DETECTED NOT DETECTED Final   Parainfluenza Virus 3 NOT DETECTED NOT DETECTED Final   Parainfluenza Virus 4 NOT DETECTED NOT DETECTED  Final   Respiratory Syncytial Virus NOT DETECTED NOT DETECTED Final   Bordetella pertussis NOT DETECTED NOT DETECTED Final   Bordetella Parapertussis NOT DETECTED NOT DETECTED Final   Chlamydophila pneumoniae NOT DETECTED NOT DETECTED Final   Mycoplasma pneumoniae NOT DETECTED NOT DETECTED Final    Comment: Performed at [facility], [address], [place], [place] [address]  Resp panel by RT-PCR (RSV, Flu A&B, Covid) Anterior Nasal Swab     Status: None   Collection Time: 01/10/24  1:34 PM   Specimen: Anterior Nasal Swab  Result Value Ref Range Status   SARS Coronavirus 2 by RT PCR NEGATIVE NEGATIVE Final    Comment: (NOTE) SARS-CoV-2 target nucleic acids are NOT DETECTED.  The SARS-CoV-2 RNA is generally detectable in upper respiratory specimens during the acute phase of infection. The lowest concentration of SARS-CoV-2 viral copies this assay can detect is 138 copies/mL. A negative result does not preclude SARS-Cov-2 infection and should not be used as the sole basis for treatment or other patient management decisions. A negative result may occur with  improper specimen collection/handling, submission of specimen other than nasopharyngeal swab, presence of viral mutation(s) within the areas targeted by this assay, and inadequate number of viral copies(<138 copies/mL). A negative result must be combined with clinical observations, patient history, and epidemiological information. The expected result is Negative.  Fact Sheet for Patients:  bloggercourse.com  Fact Sheet for Healthcare Providers:  seriousbroker.it  This test is no t yet approved or cleared by the [place]  FDA and  has been authorized for detection and/or diagnosis of SARS-CoV-2 by FDA under an Emergency Use Authorization (EUA). This EUA will remain  in effect (meaning this test can be used) for the duration of the COVID-19 declaration under Section  564(b)(1) of the Act, 21 U.S.C.section 360bbb-3(b)(1), unless the authorization is terminated  or revoked sooner.       Influenza A by PCR NEGATIVE NEGATIVE Final   Influenza B by PCR NEGATIVE NEGATIVE Final    Comment: (NOTE) The Xpert Xpress SARS-CoV-2/FLU/RSV plus assay is intended as an aid in the diagnosis of influenza from Nasopharyngeal swab specimens and should not be used as a sole basis for treatment. Nasal washings and aspirates are unacceptable for Xpert Xpress SARS-CoV-2/FLU/RSV testing.  Fact Sheet for Patients: bloggercourse.com  Fact Sheet for Healthcare Providers: seriousbroker.it  This test is not yet approved or cleared by the [place]  FDA and has been authorized for detection and/or diagnosis of SARS-CoV-2 by FDA under an Emergency Use Authorization (EUA). This EUA will remain in effect (meaning this test can be used) for the duration of the COVID-19 declaration under Section 564(b)(1) of the  Act, 21 U.S.C. section 360bbb-3(b)(1), unless the authorization is terminated or revoked.     Resp Syncytial Virus by PCR NEGATIVE NEGATIVE Final    Comment: (NOTE) Fact Sheet for Patients: bloggercourse.com  Fact Sheet for Healthcare Providers: seriousbroker.it  This test is not yet approved or cleared by the [place]  FDA and has been authorized for detection and/or diagnosis of SARS-CoV-2 by FDA under an Emergency Use Authorization (EUA). This EUA will remain in effect (meaning this test can be used) for the duration of the COVID-19 declaration under Section 564(b)(1) of the Act, 21 U.S.C. section 360bbb-3(b)(1), unless the authorization is terminated or revoked.  Performed at [facility], [address], [place], [place] [address]   Yeast Susceptibilities     Status: None   Collection Time: 01/10/24  2:12 PM  Result Value Ref  Range Status   SOURCE LAB 10504 C GLABRATA PLEURAL FLD SENSI  Corrected    Comment: Performed at [facility], [address], [place], [place] [address] CORRECTED ON 11/25 AT 0845: PREVIOUSLY REPORTED AS LAB 10504 C GLABRATA BLOOD SENSI    Organism ID, Yeast Preliminary report  Final    Comment: (NOTE) Specimen has been received and testing has been initiated. Performed At: [facility] [address] [place], [place] [id number] Jennette Shorter MD Ey:[id number]    Amphotericin B MIC PENDING  Incomplete   Anidulafungin MIC PENDING  Incomplete   Caspofungin MIC PENDING  Incomplete   Fluconazole Islt MIC PENDING  Incomplete   ISAVUCONAZOLE MIC PENDING  Incomplete   Itraconazole MIC PENDING  Incomplete   Micafungin  MIC PENDING  Incomplete   Posaconazole MIC PENDING  Incomplete   REZAFUNGIN MIC PENDING  Incomplete   Voriconazole MIC PENDING  Incomplete  Culture, blood (Routine X 2) w Reflex to ID Panel     Status: None (Preliminary result)   Collection Time: 01/15/24  8:46 PM   Specimen: BLOOD RIGHT FOREARM  Result Value Ref Range Status   Specimen Description   Final    BLOOD RIGHT FOREARM Performed at [facility], [address] [place], [place] [address]    Special Requests   Final    BOTTLES DRAWN AEROBIC ONLY Blood Culture results may not be optimal due to an inadequate volume of blood received in culture bottles Performed at [facility], [address], [place], [place] [address]    Culture  Setup Time   Final    GRAM POSITIVE COCCI AEROBIC BOTTLE ONLY CRITICAL RESULT CALLED TO, READ BACK BY AND VERIFIED WITH: PHARMD E JACKSON 01/17/2024 @ 0429 BY AB Performed at [facility], [address], [place], [place] [address]    Culture GRAM POSITIVE COCCI  Final   Report Status PENDING  Incomplete  Culture, blood (Routine X 2) w Reflex to ID Panel     Status: None (Preliminary result)   Collection Time: 01/15/24  8:46 PM    Specimen: BLOOD RIGHT HAND  Result Value Ref Range Status   Specimen Description   Final    BLOOD RIGHT HAND Performed at [facility], [address], [place], [place] [address]    Special Requests   Final    BOTTLES DRAWN AEROBIC ONLY Blood Culture results may not be optimal due to an inadequate volume of blood received in culture bottles Performed at [facility], [address] [place], [place] [address]    Culture   Final  NO GROWTH 1 DAY Performed at [facility] [facility], [address] [place], [place] [address]    Report Status PENDING  Incomplete  Blood Culture ID Panel (Reflexed)     Status: Abnormal   Collection Time: 01/15/24  8:46 PM  Result Value Ref Range Status   Enterococcus faecalis NOT DETECTED NOT DETECTED Final   Enterococcus Faecium DETECTED (A) NOT DETECTED Final    Comment: CRITICAL RESULT CALLED TO, READ BACK BY AND VERIFIED WITH: PHARMD E JACKSON 01/17/2024 @ 0429 BY AB    Listeria monocytogenes NOT DETECTED NOT DETECTED Final   Staphylococcus species NOT DETECTED NOT DETECTED Final   Staphylococcus aureus (BCID) NOT DETECTED NOT DETECTED Final   Staphylococcus epidermidis NOT DETECTED NOT DETECTED Final   Staphylococcus lugdunensis NOT DETECTED NOT DETECTED Final   Streptococcus species NOT DETECTED NOT DETECTED Final   Streptococcus agalactiae NOT DETECTED NOT DETECTED Final   Streptococcus pneumoniae NOT DETECTED NOT DETECTED Final   Streptococcus pyogenes NOT DETECTED NOT DETECTED Final   A.calcoaceticus-baumannii NOT DETECTED NOT DETECTED Final   Bacteroides fragilis NOT DETECTED NOT DETECTED Final   Enterobacterales NOT DETECTED NOT DETECTED Final   Enterobacter cloacae complex NOT DETECTED NOT DETECTED Final   Escherichia coli NOT DETECTED NOT DETECTED Final   Klebsiella aerogenes NOT DETECTED NOT DETECTED Final   Klebsiella oxytoca NOT DETECTED NOT DETECTED Final   Klebsiella pneumoniae NOT DETECTED NOT DETECTED Final    Proteus species NOT DETECTED NOT DETECTED Final   Salmonella species NOT DETECTED NOT DETECTED Final   Serratia marcescens NOT DETECTED NOT DETECTED Final   Haemophilus influenzae NOT DETECTED NOT DETECTED Final   Neisseria meningitidis NOT DETECTED NOT DETECTED Final   Pseudomonas aeruginosa NOT DETECTED NOT DETECTED Final   Stenotrophomonas maltophilia NOT DETECTED NOT DETECTED Final   Candida albicans NOT DETECTED NOT DETECTED Final   Candida auris NOT DETECTED NOT DETECTED Final   Candida glabrata NOT DETECTED NOT DETECTED Final   Candida krusei NOT DETECTED NOT DETECTED Final   Candida parapsilosis NOT DETECTED NOT DETECTED Final   Candida tropicalis NOT DETECTED NOT DETECTED Final   Cryptococcus neoformans/gattii NOT DETECTED NOT DETECTED Final   Vancomycin resistance DETECTED (A) NOT DETECTED Final    Comment: CRITICAL RESULT CALLED TO, READ BACK BY AND VERIFIED WITH: PHARMD E JACKSON 01/17/2024 @ 0429 BY AB Performed at [facility], [address], [place], [place] [address]   Culture, blood (single) w Reflex to ID Panel     Status: None (Preliminary result)   Collection Time: 01/17/24  1:21 PM   Specimen: BLOOD RIGHT HAND  Result Value Ref Range Status   Specimen Description   Final    BLOOD RIGHT HAND Performed at [facility], [address], [place], [place] [address]    Special Requests   Final    BOTTLES DRAWN AEROBIC ONLY Blood Culture results may not be optimal due to an inadequate volume of blood received in culture bottles Performed at [facility], [address] [place], [place] [address]    Culture PENDING  Incomplete   Report Status PENDING  Incomplete    Labs: Results for orders placed or performed during the hospital encounter of 01/10/24 (from the past 48 hours)  Glucose, capillary     Status: None   Collection Time: 01/16/24 12:32 PM  Result Value Ref Range   Glucose-Capillary 98 70 - 99 mg/dL    Comment: Glucose  reference range applies only  to samples taken after fasting for at least 8 hours.  Glucose, capillary     Status: Abnormal   Collection Time: 01/16/24  3:40 PM  Result Value Ref Range   Glucose-Capillary 104 (H) 70 - 99 mg/dL    Comment: Glucose reference range applies only to samples taken after fasting for at least 8 hours.  Glucose, capillary     Status: None   Collection Time: 01/16/24  9:29 PM  Result Value Ref Range   Glucose-Capillary 81 70 - 99 mg/dL    Comment: Glucose reference range applies only to samples taken after fasting for at least 8 hours.  Glucose, capillary     Status: Abnormal   Collection Time: 01/16/24 11:43 PM  Result Value Ref Range   Glucose-Capillary 105 (H) 70 - 99 mg/dL    Comment: Glucose reference range applies only to samples taken after fasting for at least 8 hours.  Magnesium      Status: Abnormal   Collection Time: 01/17/24  4:37 AM  Result Value Ref Range   Magnesium  2.5 (H) 1.7 - 2.4 mg/dL    Comment: Performed at [facility], [address] [place], [place] [address]  Phosphorus     Status: None   Collection Time: 01/17/24  4:37 AM  Result Value Ref Range   Phosphorus 4.2 2.5 - 4.6 mg/dL    Comment: Performed at [facility], [address] [place], [place] [address]  Basic metabolic panel with GFR     Status: Abnormal   Collection Time: 01/17/24  4:37 AM  Result Value Ref Range   Sodium 147 (H) 135 - 145 mmol/L   Potassium 4.1 3.5 - 5.1 mmol/L   Chloride 112 (H) 98 - 111 mmol/L   CO2 24 22 - 32 mmol/L   Glucose, Bld 117 (H) 70 - 99 mg/dL    Comment: Glucose reference range applies only to samples taken after fasting for at least 8 hours.   BUN 63 (H) 8 - 23 mg/dL   Creatinine, Ser 9.10 0.44 - 1.00 mg/dL   Calcium  9.9 8.9 - 10.3 mg/dL   GFR, Estimated >39 >39 mL/min    Comment: (NOTE) Calculated using the CKD-EPI Creatinine Equation (2021)    Anion gap 11 5 - 15    Comment: Performed at [facility], [address] [place], [place] [address]  Glucose, capillary     Status: Abnormal   Collection Time: 01/17/24  4:41 AM  Result Value Ref Range   Glucose-Capillary 110 (H) 70 - 99 mg/dL    Comment: Glucose reference range applies only to samples taken after fasting for at least 8 hours.  Glucose, capillary     Status: Abnormal   Collection Time: 01/17/24  8:09 AM  Result Value Ref Range   Glucose-Capillary 119 (H) 70 - 99 mg/dL    Comment: Glucose reference range applies only to samples taken after fasting for at least 8 hours.  Glucose, capillary     Status: Abnormal   Collection Time: 01/17/24 11:50 AM  Result Value Ref Range   Glucose-Capillary 131 (H) 70 - 99 mg/dL    Comment: Glucose reference range applies only to samples taken after fasting for at least 8 hours.  Culture, blood (single) w Reflex to ID Panel     Status: None (Preliminary result)   Collection Time: 01/17/24  1:21 PM   Specimen: BLOOD RIGHT HAND  Result Value Ref Range   Specimen Description  BLOOD RIGHT HAND Performed at [facility], [address], [place], [place] [address]    Special Requests      BOTTLES DRAWN AEROBIC ONLY Blood Culture results may not be optimal due to an inadequate volume of blood received in culture bottles Performed at [facility], [address] [place], [place] [address]    Culture PENDING    Report Status PENDING   Glucose, capillary     Status: Abnormal   Collection Time: 01/17/24  3:41 PM  Result Value Ref Range   Glucose-Capillary 141 (H) 70 - 99 mg/dL    Comment: Glucose reference range applies only to samples taken after fasting for at least 8 hours.  Glucose, capillary     Status: Abnormal   Collection Time: 01/17/24  8:59 PM  Result Value Ref Range   Glucose-Capillary 136 (H) 70 - 99 mg/dL    Comment: Glucose reference range applies only to samples taken after fasting for at least 8 hours.  Glucose, capillary      Status: Abnormal   Collection Time: 01/18/24 12:01 AM  Result Value Ref Range   Glucose-Capillary 121 (H) 70 - 99 mg/dL    Comment: Glucose reference range applies only to samples taken after fasting for at least 8 hours.  Glucose, capillary     Status: Abnormal   Collection Time: 01/18/24  5:12 AM  Result Value Ref Range   Glucose-Capillary 127 (H) 70 - 99 mg/dL    Comment: Glucose reference range applies only to samples taken after fasting for at least 8 hours.    Imaging / Studies: DG Chest Port 1 View Result Date: 01/17/2024 CLINICAL DATA:  Acute respiratory failure. Follow-up pneumothorax. On ventilator. EXAM: PORTABLE CHEST 1 VIEW COMPARISON:  01/16/2024 FINDINGS: Patient is significantly rotated to the right which limits evaluation. Endotracheal tube remains in appropriate position. A right-sided pleural catheter is seen overlying the right lung base. No definite pneumothorax is seen on today's exam which is limited by positioning. Decreased infiltrate or atelectasis is seen in the right lung base. Left lung remains clear. Heart size within normal limits. IMPRESSION: Limited exam due to positioning. No definite pneumothorax visualized on today's exam. Decreased right basilar infiltrate or atelectasis. Electronically Signed   By: Norleen DELENA Kil M.D.   On: 01/17/2024 05:40   DG Abd 1 View Result Date: 01/16/2024 EXAM: 1 VIEW XRAY OF THE ABDOMEN 01/16/2024 07:29:00 PM COMPARISON: 01/10/2024 CLINICAL HISTORY: Encounter for imaging study to confirm nasogastric (NG) tube placement. FINDINGS: LINES, TUBES AND DEVICES: Enteric tube tip and side port in satisfactory position. Right chest tube noted. Right femoral line noted. BOWEL: Nonobstructive bowel gas pattern. SOFT TISSUES: Cholecystectomy clips noted. No opaque urinary calculi. BONES: Levoscoliosis of the lumbar spine. No acute osseous abnormality. PLEURAL SPACES: Right pleural effusion. IMPRESSION: 1. Enteric tube tip and side port in  satisfactory position. Electronically signed by: Norman Gatlin MD 01/16/2024 07:37 PM EST RP Workstation: [id number]   CT CHEST WO CONTRAST Result Date: 01/16/2024 CLINICAL DATA:  Assess for esophageal tear. EXAM: CT CHEST WITHOUT CONTRAST TECHNIQUE: Multidetector CT imaging of the chest was performed following the standard protocol without IV contrast. RADIATION DOSE REDUCTION: This exam was performed according to the departmental dose-optimization program which includes automated exposure control, adjustment of the mA and/or kV according to patient size and/or use of iterative reconstruction technique. COMPARISON:  Esophagram same day.  CT chest 01/15/2024. FINDINGS: Cardiovascular: The heart is mildly enlarged. The aorta is normal in  size. There are atherosclerotic calcifications of the aorta. There is no pericardial effusion. Mediastinum/Nodes: The endotracheal tube tip is proximally 3.3 cm above the carina. Enteric tube is seen throughout nondilated esophagus. Hiatal hernia containing Nissen fundoplication likely present. There is oral contrast seen within the region of the Nissen fundoplication and stomach likely related to recent esophagram. There is a small column of contrast extravasating from the right side of the Nissen fundoplication area within the hiatal hernia on image 2/113 with minimal contrast extending into the adjacent right pleural space. There is no evidence for pneumomediastinum. No enlarged lymph nodes are seen. Lungs/Pleura: There is a small layering right pleural effusion which has significantly decreased from prior. Right pleural drainage catheter is in place. There is a small amount of air in the right pleural space likely related to the catheter. Patchy airspace and ground-glass opacities are seen in the bilateral lower lobes, right middle lobe and bilateral upper lobes. There is mild emphysema. There is also some likely new aspirated oral contrast within peripheral right lower lobe  bronchi. Upper Abdomen: No acute abnormality. Musculoskeletal: No chest wall mass or suspicious bone lesions identified. IMPRESSION: 1. Small column of contrast extravasating from the right side of the Nissen fundoplication area within the hiatal hernia with minimal contrast extending into the adjacent right pleural space. Findings are compatible with small leak. 2. Small right pleural effusion has significantly decreased from prior. Right pleural drainage catheter is in place. 3. Patchy airspace and ground-glass opacities in the bilateral lower lobes, right middle lobe and bilateral upper lobes worrisome for multifocal pneumonia. 4. New aspirated oral contrast within peripheral right lower lobe bronchi. Aortic Atherosclerosis (ICD10-I70.0) and Emphysema (ICD10-J43.9). These results were called by telephone at the time of interpretation on 01/16/2024 at 6:35 pm to provider [facility] , who verbally acknowledged these results. Electronically Signed   By: Greig Pique M.D.   On: 01/16/2024 18:35   DG CHEST PORT 1 VIEW Result Date: 01/16/2024 CLINICAL DATA:  Enteric tube placement EXAM: PORTABLE CHEST 1 VIEW COMPARISON:  Chest radiograph dated 01/15/2024 FINDINGS: Lines/tubes: Endotracheal tube tip projects 3.0 cm above the carina. Gastric/enteric tube has been retracted and tip projects over the mid esophagus. Right basilar pleural catheter in-situ, unchanged. Lungs: Slightly increased diffuse patchy opacities, right-greater-than-left. Pleura: Unchanged trace right pleural effusion. Possible trace pneumothorax component at the right apex. Heart/mediastinum: The heart size and mediastinal contours are within normal limits. Bones: No acute osseous abnormality. Right upper quadrant surgical clips. Cervical spinal fixation hardware appears intact. IMPRESSION: 1. Gastric/enteric tube has been retracted and tip projects over the mid esophagus. 2. Endotracheal tube tip projects 3.0 cm above the carina. 3. Slightly  increased diffuse patchy opacities, right-greater-than-left. 4. Unchanged trace right pleural effusion. Possible trace pneumothorax component at the right apex, better evaluated on subsequent chest CT. Electronically Signed   By: Limin  Xu M.D.   On: 01/16/2024 15:02   DG ESOPHAGUS W SINGLE CM (SOL OR THIN BA) Result Date: 01/16/2024 EXAM: SINGLE CONTRAST ESOPHAGRAM 01/16/2024 12:15:00 PM TECHNIQUE: Multiple single contrast images of the esophagus and gastroesophageal junction were obtained following the oral administration of water  soluble contrast. The patient is endotracheally intubated. Today's examination is performed through the nasogastric tube, the tip of which is in the mid esophagus. Approximately 35 cc of Omnipaque  300 was injected through the feeding tube into the esophagus. Much of this extended proximally towards the oral cavity, although some slowly percolated through the distal esophagus into the stomach. Various maneuvers  including tilting the table up to 10 degrees and turning of the patient was performed to depict the distal esophagus. Some of the contrast was gently suctioned from the esophagus back through the nasogastric tube, and the nasogastric tube was flushed with water . FLUOROSCOPY DOSE AND TYPE: Radiation Dose Index: Reference Air Kerma (in mGy) = 20.1 COMPARISON: CT abdomen from 12/29/2023. CLINICAL HISTORY: Right pneumonia and pneumothorax, assessment for esophageal perforation. FINDINGS: The nasogastric tube tip is in the mid esophagus. Following injection of approximately 35 cc of Omnipaque  300 through the nasogastric tube, contrast extended proximally towards the oral cavity and slowly percolated through the distal esophagus into the stomach. Contrast medium flows from the distal esophagus into the stomach with secondary and tertiary esophageal contractions noted. The appearance of curvilinear contrast extending in this region from the stomach into the lower chest on series 3 is  compatible with the appearance of the fundoplication wrap, for example on the CT abdomen from 12/29/2023, and is not felt to be indicative of a leak. This fundoplication wrap only filled in a very minimal manner; there was no feasible way to encourage more filling. Overall no esophageal leak was demonstrated on today's exam. No stricture or obstruction. IMPRESSION: 1. No esophageal perforation demonstrated limited assessment. Electronically signed by: Ryan Salvage MD 01/16/2024 12:32 PM EST RP Workstation: [id number]    Medications / Allergies: per chart  Antibiotics: Anti-infectives (From admission, onward)    Start     Dose/Rate Route Frequency Ordered Stop   01/17/24 0530  DAPTOmycin (CUBICIN) IVPB 500 mg/49mL premix        500 mg 100 mL/hr over 30 Minutes Intravenous Daily 01/17/24 0440     01/16/24 0900  metroNIDAZOLE  (FLAGYL ) IVPB 500 mg        500 mg 100 mL/hr over 60 Minutes Intravenous Every 12 hours 01/16/24 0756     01/12/24 0600  cefTRIAXone  (ROCEPHIN ) 2 g in sodium chloride  0.9 % 100 mL IVPB        2 g 200 mL/hr over 30 Minutes Intravenous Every 24 hours 01/11/24 0804     01/11/24 0600  cefTRIAXone  (ROCEPHIN ) 2 g in sodium chloride  0.9 % 100 mL IVPB        2 g 200 mL/hr over 30 Minutes Intravenous  Once 01/10/24 1433 01/11/24 0625   01/10/24 1330  micafungin  (MYCAMINE ) 100 mg in sodium chloride  0.9 % 100 mL IVPB        100 mg 105 mL/hr over 1 Hours Intravenous Every 24 hours 01/10/24 1220     01/10/24 0630  cefTRIAXone  (ROCEPHIN ) 2 g in sodium chloride  0.9 % 100 mL IVPB        2 g 200 mL/hr over 30 Minutes Intravenous  Once 01/10/24 0624 01/10/24 0853   01/10/24 0630  azithromycin  (ZITHROMAX ) 500 mg in sodium chloride  0.9 % 250 mL IVPB  Status:  Discontinued        500 mg 250 mL/hr over 60 Minutes Intravenous Every 24 hours 01/10/24 0624 01/12/24 1400         Note: Portions of this report may have been transcribed using voice recognition software. Every effort  was made to ensure accuracy; however, inadvertent computerized transcription errors may be present.   Any transcriptional errors that result from this process are unintentional.    Elspeth KYM Schultze, MD, FACS, MASCRS Esophageal, Gastrointestinal & Colorectal Surgery Robotic and Minimally Invasive Surgery  [facility] [address], [address] [place],  [place] [address] [phone] Fax [phone] Main  CONTACT INFORMATION: Weekday (9AM-5PM): Call [facility] main office at [phone] Weeknight (5PM-9AM) or Weekend/Holiday: Check EPIC Web Links tab & use AMION (password  [email or web address]) for General Surgery [facility] coverage  Please, DO NOT use SecureChat  (it is not reliable communication to reach operating surgeons & will lead to a delay in care).   Epic staff messaging available for outpatient concerns needing 1-2 business day response.      01/18/2024  7:59 AM

## 2024-01-19 ENCOUNTER — Inpatient Hospital Stay (HOSPITAL_COMMUNITY)

## 2024-01-19 DIAGNOSIS — Z7189 Other specified counseling: Secondary | ICD-10-CM | POA: Diagnosis not present

## 2024-01-19 DIAGNOSIS — Z515 Encounter for palliative care: Secondary | ICD-10-CM | POA: Diagnosis not present

## 2024-01-19 DIAGNOSIS — K9189 Other postprocedural complications and disorders of digestive system: Secondary | ICD-10-CM | POA: Diagnosis not present

## 2024-01-19 LAB — BLOOD CULTURE ID PANEL (REFLEXED) - BCID2

## 2024-01-19 LAB — GLUCOSE, CAPILLARY
Glucose-Capillary: 100 mg/dL — ABNORMAL HIGH (ref 70–99)
Glucose-Capillary: 100 mg/dL — ABNORMAL HIGH (ref 70–99)
Glucose-Capillary: 101 mg/dL — ABNORMAL HIGH (ref 70–99)
Glucose-Capillary: 138 mg/dL — ABNORMAL HIGH (ref 70–99)
Glucose-Capillary: 86 mg/dL (ref 70–99)
Glucose-Capillary: 91 mg/dL (ref 70–99)
Glucose-Capillary: 91 mg/dL (ref 70–99)
Glucose-Capillary: 99 mg/dL (ref 70–99)

## 2024-01-19 LAB — MISC LABCORP TEST (SEND OUT): Labcorp test code: 88062

## 2024-01-19 LAB — CBC WITH DIFFERENTIAL/PLATELET
Abs Immature Granulocytes: 2.6 K/uL — ABNORMAL HIGH (ref 0.00–0.07)
Basophils Absolute: 0 K/uL (ref 0.0–0.1)
Basophils Relative: 0 %
Eosinophils Absolute: 0.2 K/uL (ref 0.0–0.5)
Eosinophils Relative: 1 %
HCT: 21.3 % — ABNORMAL LOW (ref 36.0–46.0)
Hemoglobin: 6.6 g/dL — CL (ref 12.0–15.0)
Immature Granulocytes: 14 %
Lymphocytes Relative: 7 %
Lymphs Abs: 1.3 K/uL (ref 0.7–4.0)
MCH: 25.4 pg — ABNORMAL LOW (ref 26.0–34.0)
MCHC: 31 g/dL (ref 30.0–36.0)
MCV: 81.9 fL (ref 80.0–100.0)
Monocytes Absolute: 0.3 K/uL (ref 0.1–1.0)
Monocytes Relative: 2 %
Neutro Abs: 13.9 K/uL — ABNORMAL HIGH (ref 1.7–7.7)
Neutrophils Relative %: 76 %
Platelets: 74 K/uL — ABNORMAL LOW (ref 150–400)
RBC: 2.6 MIL/uL — ABNORMAL LOW (ref 3.87–5.11)
RDW: 18.9 % — ABNORMAL HIGH (ref 11.5–15.5)
Smear Review: NORMAL
WBC: 18.4 K/uL — ABNORMAL HIGH (ref 4.0–10.5)
nRBC: 0.7 % — ABNORMAL HIGH (ref 0.0–0.2)

## 2024-01-19 LAB — BASIC METABOLIC PANEL WITH GFR
Anion gap: 6 (ref 5–15)
BUN: 24 mg/dL — ABNORMAL HIGH (ref 8–23)
CO2: 25 mmol/L (ref 22–32)
Calcium: 8.2 mg/dL — ABNORMAL LOW (ref 8.9–10.3)
Chloride: 110 mmol/L (ref 98–111)
Creatinine, Ser: 0.52 mg/dL (ref 0.44–1.00)
GFR, Estimated: >60 mL/min (ref >60)
Glucose, Bld: 104 mg/dL — ABNORMAL HIGH (ref 70–99)
Potassium: 3.5 mmol/L (ref 3.5–5.1)
Sodium: 141 mmol/L (ref 135–145)

## 2024-01-19 LAB — BODY FLUID CULTURE W GRAM STAIN

## 2024-01-19 LAB — CULTURE, BLOOD (ROUTINE X 2)

## 2024-01-19 LAB — HEMOGLOBIN AND HEMATOCRIT, BLOOD
HCT: 29.3 % — ABNORMAL LOW (ref 36.0–46.0)
Hemoglobin: 9.5 g/dL — ABNORMAL LOW (ref 12.0–15.0)

## 2024-01-19 LAB — MAGNESIUM: Magnesium: 1.6 mg/dL — ABNORMAL LOW (ref 1.7–2.4)

## 2024-01-19 LAB — TRIGLYCERIDES: Triglycerides: 204 mg/dL — ABNORMAL HIGH (ref <150)

## 2024-01-19 LAB — PHOSPHORUS: Phosphorus: 2.6 mg/dL (ref 2.5–4.6)

## 2024-01-19 LAB — PREPARE RBC (CROSSMATCH)

## 2024-01-19 MED ORDER — SODIUM CHLORIDE 0.9% IV SOLUTION: Freq: Once | INTRAVENOUS | Status: AC

## 2024-01-19 MED ORDER — MAGNESIUM SULFATE 2 GM/50ML IV SOLN
2.0000 g | Freq: Once | INTRAVENOUS | Status: AC
  Administered 2024-01-19: 2 g via INTRAVENOUS
  Filled 2024-01-19: qty 50

## 2024-01-19 NOTE — Plan of Care (Signed)
  Problem: Clinical Measurements: Goal: Will remain free from infection Outcome: Progressing Goal: Diagnostic test results will improve Outcome: Progressing Goal: Cardiovascular complication will be avoided Outcome: Progressing   Problem: Elimination: Goal: Will not experience complications related to bowel motility Outcome: Progressing Goal: Will not experience complications related to urinary retention Outcome: Progressing   Problem: Activity: Goal: Risk for activity intolerance will decrease Outcome: Not Progressing   Problem: Nutrition: Goal: Adequate nutrition will be maintained Outcome: Not Progressing   Problem: Coping: Goal: Level of anxiety will decrease Outcome: Not Progressing   Problem: Skin Integrity: Goal: Risk for impaired skin integrity will decrease Outcome: Not Progressing

## 2024-01-19 NOTE — Progress Notes (Signed)
      INFECTIOUS DISEASE ATTENDING ADDENDUM:   Date: 01/19/2024  Patient name: Renee Gutierrez  Medical record number: [id number]  Date of birth: 17-Dec-1960    Chart, labs reviewed.  Patient remains in tenuous condition  Continue current antibiotic plan.   Jomarie Salinas Dam 01/19/2024, 2:45 PM

## 2024-01-19 NOTE — Progress Notes (Signed)
 PHARMACY - PHYSICIAN COMMUNICATION CRITICAL VALUE ALERT - BLOOD CULTURE IDENTIFICATION (BCID)  Renee Gutierrez is an 63 y.o. 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   Assessment:  3rd set Staph Epi, MecA+  Name of physician (or Provider) Contacted:   Current antibiotics: daptomycin  Changes to prescribed antibiotics recommended:  Patient is on recommended antibiotics - No changes needed  Results for orders placed or performed during the hospital encounter of 01/10/24  Blood Culture ID Panel (Reflexed) (Collected: 01/17/2024  1:21 PM)  Result Value Ref Range   Enterococcus faecalis NOT DETECTED NOT DETECTED   Enterococcus Faecium NOT DETECTED NOT DETECTED   Listeria monocytogenes NOT DETECTED NOT DETECTED   Staphylococcus species DETECTED (A) NOT DETECTED   Staphylococcus aureus (BCID) NOT DETECTED NOT DETECTED   Staphylococcus epidermidis DETECTED (A) NOT DETECTED   Staphylococcus lugdunensis NOT DETECTED NOT DETECTED   Streptococcus species NOT DETECTED NOT DETECTED   Streptococcus agalactiae NOT DETECTED NOT DETECTED   Streptococcus pneumoniae NOT DETECTED NOT DETECTED   Streptococcus pyogenes NOT DETECTED NOT DETECTED   A.calcoaceticus-baumannii NOT DETECTED NOT DETECTED   Bacteroides fragilis NOT DETECTED NOT DETECTED   Enterobacterales NOT DETECTED NOT DETECTED   Enterobacter cloacae complex NOT DETECTED NOT DETECTED   Escherichia coli NOT DETECTED NOT DETECTED   Klebsiella aerogenes NOT DETECTED NOT DETECTED   Klebsiella oxytoca NOT DETECTED NOT DETECTED   Klebsiella pneumoniae NOT DETECTED NOT DETECTED   Proteus species NOT DETECTED NOT DETECTED   Salmonella species NOT DETECTED NOT DETECTED   Serratia marcescens NOT DETECTED NOT DETECTED   Haemophilus influenzae NOT DETECTED NOT DETECTED   Neisseria meningitidis NOT DETECTED NOT DETECTED   Pseudomonas  aeruginosa NOT DETECTED NOT DETECTED   Stenotrophomonas maltophilia NOT DETECTED NOT DETECTED   Candida albicans NOT DETECTED NOT DETECTED   Candida auris NOT DETECTED NOT DETECTED   Candida glabrata NOT DETECTED NOT DETECTED   Candida krusei NOT DETECTED NOT DETECTED   Candida parapsilosis NOT DETECTED NOT DETECTED   Candida tropicalis NOT DETECTED NOT DETECTED   Cryptococcus neoformans/gattii NOT DETECTED NOT DETECTED   Methicillin resistance mecA/C DETECTED (A) NOT DETECTED    Leeroy Mace RPh 01/19/2024, 5:39 AM

## 2024-01-19 NOTE — Progress Notes (Signed)
 PT Cancellation Note  Patient Details Name: Renee Gutierrez MRN: [id number] DOB: November 08, 1960   Cancelled Treatment:    Reason Eval/Treat Not Completed: Medical issues which prohibited therapy Remains on  ventilator.  Darice Potters PT Acute Rehabilitation Services Office [phone]   Potters Darice Norris 01/19/2024, 6:48 AM

## 2024-01-19 NOTE — Progress Notes (Signed)
 Daily Progress Note   Patient Name: Renee Gutierrez       Date: 01/19/2024 DOB: 08-Sep-1960  Age: 63 y.o. MRN#: [id number] Attending Physician: Neda Jennet LABOR, MD Primary Care Physician: Patient, No Pcp Per Admit Date: 01/10/2024  Reason for Consultation/Follow-up: Establishing goals of care   Length of Stay: 9  Current Medications: Scheduled Meds:   sodium chloride    Intravenous Once   arformoterol   15 mcg Nebulization BID   budesonide  (PULMICORT ) nebulizer solution  0.5 mg Nebulization BID   Chlorhexidine  Gluconate Cloth  6 each Topical Daily   heparin   5,000 Units Subcutaneous Q8H   nicotine   14 mg Transdermal Daily   mouth rinse  15 mL Mouth Rinse Q2H   pantoprazole  (PROTONIX ) IV  40 mg Intravenous Q12H   phenytoin  (DILANTIN ) IV  100 mg Intravenous Q12H   prednisoLONE  acetate  1 drop Left Eye Daily   revefenacin   175 mcg Nebulization Daily   sodium chloride  flush  10 mL Intrapleural Q8H    Continuous Infusions:  cefTRIAXone  (ROCEPHIN )  IV Stopped (01/19/24 0620)   DAPTOmycin Stopped (01/18/24 1416)   dextrose  75 mL/hr at 01/19/24 1136   HYDROmorphone  2.5 mg/hr (01/19/24 0954)   metronidazole  Stopped (01/19/24 1048)   micafungin  (MYCAMINE ) 100 mg in sodium chloride  0.9 % 100 mL IVPB Stopped (01/18/24 1441)   propofol  (DIPRIVAN ) infusion 30 mcg/kg/min (01/19/24 0800)    PRN Meds: acetaminophen  **OR** [DISCONTINUED] acetaminophen , alum & mag hydroxide-simeth, bisacodyl , hydrALAZINE , HYDROmorphone , HYDROmorphone  (DILAUDID ) injection, ipratropium-albuterol , labetalol , lip balm, midazolam  PF, mouth rinse, phenol  Physical Exam Vitals reviewed.  Constitutional:      General: She is not in acute distress.    Appearance: She is ill-appearing.     Interventions: She is  intubated.  Cardiovascular:     Rate and Rhythm: Tachycardia present.  Pulmonary:     Effort: She is intubated.  Skin:    General: Skin is dry.             Vital Signs: BP (!) 152/60 (BP Location: Right Arm)   Pulse (!) 103   Temp 99.7 F (37.6 C)   Resp 19   Ht 4' 11 (1.499 m)   Wt 49.6 kg   SpO2 100%   BMI 22.09 kg/m  SpO2: SpO2: 100 % O2 Device: O2 Device: Ventilator  O2 Flow Rate: O2 Flow Rate (L/min): 45 L/min      Patient Active Problem List   Diagnosis Date Noted   Malnutrition of moderate degree 01/18/2024   VRE (vancomycin-resistant Enterococci) infection 01/18/2024   Yeast infection 01/18/2024   Chronic gastric ulcer (cardia) with perforation/leak 01/17/2024   Incarcerated hiatal hernia with ulcer at cardia and perforation/leak 01/17/2024   Cachexia 01/17/2024   Constipation 01/17/2024   Spontaneous pneumothorax s/p right chest tube 01/17/2024   Severe protein-calorie malnutrition 01/13/2024   Empyema (HCC) 01/12/2024   Chronic gastric ulcer without hemorrhage and without perforation 01/11/2024   Abdominal pain, chronic, epigastric 01/11/2024   Septic shock (HCC) 01/10/2024   Acute hypoxic respiratory failure (HCC) 01/10/2024   Uncomplicated opioid dependence (HCC) 01/01/2024   Nausea vomiting and diarrhea 01/01/2024   Rectal bleeding 12/31/2023   Intractable nausea and vomiting 12/23/2023   Iron deficiency anemia due to chronic blood loss 12/17/2023   Acute on chronic diastolic CHF (congestive heart failure) (HCC) 12/14/2023   Peptic ulcer within a sliding hiatal hernia 12/14/2023   Bilateral lower extremity edema 12/12/2023   Hypophosphatemia 12/12/2023   Upper abdominal pain 12/07/2023   GIB (gastrointestinal bleeding) 12/04/2023   Acute gastric ulcer with hemorrhage 11/28/2023   Acute post-hemorrhagic anemia 11/27/2023   Esophageal hiatal hernia 11/26/2023   Essential hypertension 11/26/2023   Chronic anemia 11/02/2023   Trigeminal neuralgia  05/13/2023   Anxiety 05/30/2020   AVM (arteriovenous malformation) brain 05/30/2020   External otitis of left ear 05/30/2020   Hypokalemia 05/30/2020   Hyperlipidemia 05/30/2020   History of intussusception 05/30/2020   Migraines 05/30/2020   Restless leg syndrome 05/30/2020   CKD (chronic kidney disease), 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/2020   Intussusception (HCC) 05/30/2020   Medicare annual wellness visit, subsequent 05/30/2020   MRSA (methicillin resistant staph aureus) culture positive 05/30/2020   Rash 05/30/2020   Prediabetes 05/30/2020   Nausea & vomiting 05/30/2020   Chronic hip pain 05/30/2020   IBS (irritable bowel syndrome) 05/30/2020   Severe chronic obstructive pulmonary disease (HCC) 05/17/2020   Long-term current use of opiate analgesic 11/19/2018   ACTH elevation 12/12/2016   Trigeminal neuralgia pain 07/31/2016   Resistant hypertension 02/18/2016   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   Ganglion cyst of dorsum of right wrist 09/13/2015   Mild intermittent asthma without complication 04/25/2015   Idiopathic peripheral neuropathy 11/22/2014   Adrenal adenoma, right 07/10/2014   Prolactinoma (HCC) 07/07/2014   Osteoarthritis of cervical spine 06/06/2014   Cubital tunnel syndrome 08/24/2013   Spinal stenosis of lumbar region with neurogenic claudication 08/03/2013   GAD (generalized anxiety disorder) 09/12/2011   Chronic pain syndrome 08/08/2011   Severe episode of recurrent major depressive disorder (HCC) 08/08/2011   GERD (gastroesophageal reflux disease) 11/01/2009    Palliative Care Assessment & Plan   Patient Profile: 63 y.o. female  with past medical history of HTN, fibromyalgia, Nissen fundoplication, GERD, and emphysema who presented to [facility] on 01/10/2024 with chest pain, somnolence and hypoxia.   Found to have LL pneumonia, R secondary spontaneous pneumothorax, and concern for opiate toxidrome with ED course c/b worsening encephalopathy requiring invasive mechanical  ventilation transferred to [facility] for CCM evaluation.    11/28 esophogram negative. Repeat CT chest shows extravasation into right chest.   Today's Discussion: Reviewed chart and received update from nursing and CCM provider. Patient remains intubated. She continues to be intolerant of SBT. As of 11/30 goal is to see if patient can be extubated over the next few  days. If not the family will likely transition to comfort measures. No family at bedside.  Left voicemail with call back information for patient's husband Renee Gutierrez and daughter Renee Gutierrez.   1500 Spoke with patient's daughter at beside. We discussed the patient's condition including her intubation, inability to get nutrition due to likely gastric or lower esophagus perforation limiting GI tract options and continued bacteremia prohibiting TPN use. We discussed the limitations of ongoing interventions and high risk for further decline despite aggressive treatment efforts. I shared my worry that the patient may not survive this hospitalization and that the team will likely suggest a transition to comfort measures in the upcoming days. We discussed what a transition to comfort measures would look like. Daughter understands and will discuss with her father.  Emotional support and therapeutic listening provided. Encouraged patient's daughter to call PMT with questions. Offered to have family meeting to answer questions if this is helpful. PMT will continue to support.    Recommendations/Plan: Continue DNR Time for outcomes- if no meaningful improvement will likely transition to comfort focused care Continued PMT support    Code Status:    Code Status Orders  (From admission, onward)           Start     Ordered   01/16/24 1115  Do not attempt resuscitation (DNR) Pre-Arrest Interventions Desired  (Code Status)  Continuous       Question Answer Comment  If pulseless and not breathing No CPR or chest compressions.   In  Pre-Arrest Conditions (Patient Has Pulse and Is Breathing) May intubate, use advanced airway interventions and cardioversion/ACLS medications if appropriate or indicated. May transfer to ICU.   Consent: Discussion documented in EHR or advanced directives reviewed      01/16/24 1115         Extensive chart review has been completed prior to seeing the patient including labs, vital signs, imaging, progress/consult notes, orders, medications, and available advance directive documents.  Care plan was discussed with bedside RN and Benton Lesches NP CCM  Time spent: 50 minutes  Thank you for allowing the Palliative Medicine Team to assist in the care of this patient.    Stephane CHRISTELLA Palin, NP  Please contact Palliative Medicine Team phone at [phone] for questions and concerns.

## 2024-01-19 NOTE — Plan of Care (Signed)
°  Problem: Education: °Goal: Knowledge of General Education information will improve °Description: Including pain rating scale, medication(s)/side effects and non-pharmacologic comfort measures °Outcome: Progressing °  °Problem: Nutrition: °Goal: Adequate nutrition will be maintained °Outcome: Not Progressing °  °

## 2024-01-19 NOTE — Progress Notes (Signed)
 OT Cancellation Note  Patient Details Name: Renee Gutierrez MRN: [id number] DOB: 1961/01/18   Cancelled Treatment:    Reason Eval/Treat Not Completed: Medical issues which prohibited therapy  Patient remains on a vent. Will continue to follow acutely when medically ready.   Ahni Bradwell OT/L Acute Rehabilitation Department  [phone]    01/19/2024, 8:28 AM

## 2024-01-19 NOTE — Progress Notes (Signed)
 NAME:  Renee Gutierrez, MRN:  [id number], DOB:  [birth year], LOS: 9 ADMISSION DATE:  01/10/2024, CONSULTATION DATE:  @TODAY @ REFERRING MD:  [facility] ED, CHIEF COMPLAINT:  acute hypoxic respiratory failure   History of Present Illness:  Renee Gutierrez is a 63 y/o F with a PMH significant for HTN, fibromyalgia, GERD, emphysema who presented to [facility] ED for severe chest pain, somnolence, and hypoxia found to have RLL pneumonia, R secondary spontaneous pneumothorax, and concern for opiate toxidrome with ED course c/b worsening encephalopathy requiring invasive mechanical ventilation transferred to [facility] for CCM evaluation.  Per ED notes, the patient presented with somnolence, hypoxia, hypotension. Initial concern for opiate toxidrome. O2 SATS 69%. She was given small dose of narcan  with decent response, more alert and breathing, O2 sats improved. Patient in more pain. CTA chest performed and showed RLL consolidation, R pleural effusion, and pneumothorax. A R pigtail chest tube was placed. The patient initially required NE and request was made to transfer patient to ICU at [facility] or [facility]. She was also placed on a narcan  drip for opiate toxidrome.  On 11/22 AM, ED provider switched and patient had a respiratory decompensation, increasingly agitated, pulling on O2 and Ivs, more hypoxic. Decision was made to intubate patient and a femoral central line was placed. The patient was slated to come to [facility] for ICU level care.  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 and presence of a nonbleeding gastric ulcer measuring 2 cm and with a nonbleeding visible vessel which was ablated with bipolar probe  11/21 --> presented to [facility] ED, opiate toxidrome, narcan  drip, CTA chest showed R pneumo, chest tube placed 11/21 --> more altered, agitated, hypoxia, intubated, CVC placed, transferred to [facility] 11/23 lytics x 1 11/24 lytics x 2 11/25 extubated but reintubated by evening due to pigtail blockage & RT pneumo, flushed pigtail and removal of fibrinous clot 11/26 lytics #3 11/27 RT fem A -line >>, RT pigtail replaced>> 1.7 L of purulent fluid 11/28 Developed A-fib RVR overnight , failed Lopressor  and Cardizem, Amio drip added 11/28 esophagram negative but repeat CT chest after contrast shows extravasation into right chest, T CTS consulted 11/29 General Surgery, GI consultation, off pressors, hypertensive with agitation 11/30 Remains on amiodarone drip, Precedex, and Dilaudid   12/1 On high dose Dilaudid  and Propofol  infusion with frequent as needed Versed  pushes. CXR stable   Interim History / Subjective:  Sedated on vent   Objective  Blood pressure (!) 152/60, pulse 77, temperature 99.1 F (37.3 C), resp. rate (!) 24, height 4' 11 (1.499 m), weight 49.6 kg, SpO2 100%.    Vent Mode: PRVC FiO2 (%):  [40 %] 40 % Set Rate:  [24 bmp] 24 bmp Vt Set:  [340 mL] 340 mL PEEP:  [5 cmH20] 5 cmH20 Plateau Pressure:  [12 cmH20-17 cmH20] 16 cmH20   Intake/Output Summary (Last 24 hours) at 01/19/2024 0719 Last data  filed at 01/19/2024 9344 Gross per 24 hour  Intake 3217.33 ml  Output 1436 ml  Net 1781.33 ml   Filed Weights   01/17/24 0456 01/18/24 0500 01/19/24 0500  Weight: 47.9 kg 48.3 kg 49.6 kg    Examination: General: Acute on chronically ill appearing severely deconditioned middle aged female lying in bed on mechanical ventilation, in NAD HEENT: Renee Gutierrez/AT, MM pink/moist, PERRL,  Neuro: Sedated on vent CV: s1s2 regular rate and rhythm, no murmur, rubs, or gallops,  PULM:  Clear to auscultation, no increased work of breathing, no added breath sounds  GI: soft, bowel sounds active in all 4 quadrants, non-tender, non-distended Extremities: warm/dry, no edema  Skin: no rashes or lesions  Resolved problem list  High anion gap metabolic acidosis RT Secondary Spontaneous Pneumothorax AKI   Assessment and Plan   High suspicion for gastric or lower esophagus perforation/leak with history of Nissen fundoplication at [facility] in 2011 and paraesophageal hernia with proximal gastric ulcer status post EGD x 3 -Imaging studies from 11/28 suggest perforation likely in the gastric part of the wrap, less likely from the esophagus, with low volume leak, causing right empyema and septic shock due to polymicrobial bacteremia/candidemia  -Last EGD 11/17 showed Nissen from duplication wrap intact, 3 cm paraesophageal hernia, nonbleeding gastric ulcer with clean base, biopsied P: -GI, TCTS and general surgery consultations have been obtained and all agree patient is not a surgical candidate and recommenced conservative measures  Acute Hypoxic Respiratory Failure due to aspiration, empyema, R pneumothorax. -Reintubated 11/25 due to right pneumothorax/pigtail blockage P: She continues to be intolerant of SBT due to severe agitation and work of breathing with sedation wean Continue to wean sedation with hopes of SBT but given inability to use GI track this will be difficult   Continue ventilator support with lung  protective strategies  Wean PEEP and FiO2 for sats greater than 90%. Head of bed elevated 30 degrees. Plateau pressures less than 30 cm H20.  Follow intermittent chest x-ray and ABG.   SAT/SBT as tolerated, mentation preclude extubation  Ensure adequate pulmonary hygiene  Follow cultures  VAP bundle in place  PAD protocol Antibiotics as below   Recurrent empyema secondary to perforation as above status post lytics x3, pigtail replaced 11/27 P: Routine chest tube care  Daily CXR  Antibiotic as below  Scheduled chest tube flushes  Continue chest tube to -20 suction  Septic shock due to gastric/esophageal perforation with empyema and aspiration pneumonia with fungemia and VRE bacteremia - shock state resolved  P: ID following appreciate assistance  Continue Daptomycin, Flagyl , and Micafungin   Follow cultures   Acute Metabolic Encephalopathy -Likely due to septic shock, uremia  -CT head negative for acute pathology Chronic opiate use P: Maintain neuro protective measures Nutrition and bowel regimen Aspirations precautions  Minimize sedation as able  PAD protocol as above   Moderate protein calorie nutrition:  Intractable nausea and vomiting with recurrent hospital admissions P:  Given gastric/esophageal perforation unable to use GI tract at this time  Patient needs  TPN moving forward but as she remains bacteremic the rick of starting TPN remains high  Will discuss with family today options for nutrition   Hypomagnesemia P:  Supplement   Anemia  -Hgb dropped to 6.6 am 12/1 P: Transfuse one unit PRBC today  Trend CBC  Transfuse per protocol  HGB goal > 7  Multiple deep tissue pressure injuries  -See [facility] note 11/29 for location and stages  P: Local wound care  Pressure alleviating devices  Goals of care  -See IPAL note 11/28, no CPR issued, she would not want tracheostomy but all other forms of resuscitation requested  - Family does not want to proceed with  surgery at this point after explaining the procedure involved -Offered transfer to a tertiary facility, but family does not want to pursue, they would like her to be closer to home - Discussed recommendations with daughter in detail 11/30, current goal would be to see if we can get her to extubate over the next 2 to 3 days.  If so we would push forward with replacing central line and parenteral nutrition to see if this leak will resolve.  If on the other and we do not make progress then they would be agreeable to comfort care measures   Critical care:   CRITICAL CARE Performed by: Placida Cambre D. Harris   Total critical care time: 45 minutes  Critical care time was exclusive of separately billable procedures and treating other patients.  Critical care was necessary to treat or prevent imminent or life-threatening deterioration.  Critical care was time spent personally by me on the following activities: development of treatment plan with patient and/or surrogate as well as nursing, discussions with consultants, evaluation of patient's response to treatment, examination of patient, obtaining history from patient or surrogate, ordering and performing treatments and interventions, ordering and review of laboratory studies, ordering and review of radiographic studies, pulse oximetry and re-evaluation of patient's condition.  Maud Rubendall D. Harris, NP-C [facility] Personal contact information can be found on Amion  If no contact or response made please call [phone] 01/19/2024, 7:20 AM

## 2024-01-19 DEATH — deceased

## 2024-01-20 ENCOUNTER — Ambulatory Visit (HOSPITAL_COMMUNITY): Admit: 2024-01-20 | Admitting: Gastroenterology

## 2024-01-20 ENCOUNTER — Encounter (HOSPITAL_COMMUNITY): Payer: Self-pay

## 2024-01-20 DIAGNOSIS — E440 Moderate protein-calorie malnutrition: Secondary | ICD-10-CM

## 2024-01-20 DIAGNOSIS — Z515 Encounter for palliative care: Secondary | ICD-10-CM

## 2024-01-20 DIAGNOSIS — N179 Acute kidney failure, unspecified: Secondary | ICD-10-CM

## 2024-01-20 DIAGNOSIS — J939 Pneumothorax, unspecified: Secondary | ICD-10-CM

## 2024-01-20 DIAGNOSIS — Z66 Do not resuscitate: Secondary | ICD-10-CM

## 2024-01-20 DIAGNOSIS — Z7189 Other specified counseling: Secondary | ICD-10-CM | POA: Diagnosis not present

## 2024-01-20 DIAGNOSIS — R64 Cachexia: Secondary | ICD-10-CM

## 2024-01-20 DIAGNOSIS — R451 Restlessness and agitation: Secondary | ICD-10-CM

## 2024-01-20 DIAGNOSIS — J869 Pyothorax without fistula: Secondary | ICD-10-CM

## 2024-01-20 DIAGNOSIS — K255 Chronic or unspecified gastric ulcer with perforation: Secondary | ICD-10-CM

## 2024-01-20 DIAGNOSIS — Z1621 Resistance to vancomycin: Secondary | ICD-10-CM

## 2024-01-20 DIAGNOSIS — K9189 Other postprocedural complications and disorders of digestive system: Secondary | ICD-10-CM | POA: Diagnosis not present

## 2024-01-20 DIAGNOSIS — J449 Chronic obstructive pulmonary disease, unspecified: Secondary | ICD-10-CM

## 2024-01-20 DIAGNOSIS — J9601 Acute respiratory failure with hypoxia: Secondary | ICD-10-CM

## 2024-01-20 DIAGNOSIS — R52 Pain, unspecified: Secondary | ICD-10-CM

## 2024-01-20 DIAGNOSIS — J189 Pneumonia, unspecified organism: Secondary | ICD-10-CM

## 2024-01-20 DIAGNOSIS — Z79899 Other long term (current) drug therapy: Secondary | ICD-10-CM

## 2024-01-20 DIAGNOSIS — A491 Streptococcal infection, unspecified site: Secondary | ICD-10-CM

## 2024-01-20 LAB — CULTURE, BLOOD (SINGLE): Culture  Setup Time: NO GROWTH

## 2024-01-20 LAB — TYPE AND SCREEN
ABO/RH(D): O POS
Antibody Screen: NEGATIVE
Unit division: 0

## 2024-01-20 LAB — CULTURE, BLOOD (ROUTINE X 2): Culture  Setup Time: NO GROWTH

## 2024-01-20 LAB — GLUCOSE, CAPILLARY
Glucose-Capillary: 110 mg/dL — ABNORMAL HIGH (ref 70–99)
Glucose-Capillary: 111 mg/dL — ABNORMAL HIGH (ref 70–99)

## 2024-01-20 LAB — YEAST SUSCEPTIBILITIES
Amphotericin B MIC: 1
Fluconazole Islt MIC: 8
ISAVUCONAZOLE MIC: 0.12
Itraconazole MIC: 0.5
Posaconazole MIC: 0.5
Voriconazole MIC: 0.25

## 2024-01-20 LAB — BASIC METABOLIC PANEL WITH GFR
Anion gap: 6 (ref 5–15)
BUN: 14 mg/dL (ref 8–23)
CO2: 26 mmol/L (ref 22–32)
Calcium: 8.8 mg/dL — ABNORMAL LOW (ref 8.9–10.3)
Chloride: 111 mmol/L (ref 98–111)
Creatinine, Ser: 0.47 mg/dL (ref 0.44–1.00)
GFR, Estimated: >60 mL/min (ref >60)
Glucose, Bld: 112 mg/dL — ABNORMAL HIGH (ref 70–99)
Potassium: 3.8 mmol/L (ref 3.5–5.1)
Sodium: 144 mmol/L (ref 135–145)

## 2024-01-20 LAB — CBC WITH DIFFERENTIAL/PLATELET
Basophils Absolute: 0 K/uL (ref 0.0–0.1)
Basophils Relative: 0 %
Eosinophils Absolute: 0.4 K/uL (ref 0.0–0.5)
Eosinophils Relative: 2 %
HCT: 30.5 % — ABNORMAL LOW (ref 36.0–46.0)
Hemoglobin: 9.7 g/dL — ABNORMAL LOW (ref 12.0–15.0)
Lymphocytes Relative: 7 %
Lymphs Abs: 1.5 K/uL (ref 0.7–4.0)
MCH: 27.1 pg (ref 26.0–34.0)
MCHC: 31.8 g/dL (ref 30.0–36.0)
MCV: 85.2 fL (ref 80.0–100.0)
Monocytes Absolute: 0 K/uL — ABNORMAL LOW (ref 0.1–1.0)
Monocytes Relative: 0 %
Neutro Abs: 19.3 K/uL — ABNORMAL HIGH (ref 1.7–7.7)
Neutrophils Relative %: 91 %
Platelets: 88 K/uL — ABNORMAL LOW (ref 150–400)
RBC: 3.58 MIL/uL — ABNORMAL LOW (ref 3.87–5.11)
RDW: 17.8 % — ABNORMAL HIGH (ref 11.5–15.5)
WBC: 21.2 K/uL — ABNORMAL HIGH (ref 4.0–10.5)
nRBC: 0.3 % — ABNORMAL HIGH (ref 0.0–0.2)

## 2024-01-20 LAB — BPAM RBC
Blood Product Expiration Date: 202512282359
ISSUE DATE / TIME: 202512011253
Unit Type and Rh: 5100

## 2024-01-20 LAB — PREALBUMIN: Prealbumin: <5 mg/dL — ABNORMAL LOW (ref 18–38)

## 2024-01-20 SURGERY — COLONOSCOPY
Anesthesia: Choice

## 2024-01-20 MED ORDER — MIDAZOLAM BOLUS VIA INFUSION
2.0000 mg | INTRAVENOUS | Status: DC | PRN
  Administered 2024-01-20: 1 mg via INTRAVENOUS
  Administered 2024-01-20: 2 mg via INTRAVENOUS
  Administered 2024-01-20: 1 mg via INTRAVENOUS
  Administered 2024-01-20: 2 mg via INTRAVENOUS
  Administered 2024-01-20 (×2): 1 mg via INTRAVENOUS
  Administered 2024-01-20: 2 mg via INTRAVENOUS

## 2024-01-20 MED ORDER — HALOPERIDOL LACTATE 5 MG/ML IJ SOLN: 1.0000 mg | INTRAMUSCULAR | Status: DC | PRN

## 2024-01-20 MED ORDER — ALBUMIN HUMAN 25 % IV SOLN
INTRAVENOUS | Status: AC
  Filled 2024-01-20: qty 50

## 2024-01-20 MED ORDER — GLYCOPYRROLATE 0.2 MG/ML IJ SOLN
0.2000 mg | INTRAMUSCULAR | Status: DC | PRN
  Administered 2024-01-21: 0.2 mg via INTRAVENOUS
  Filled 2024-01-20: qty 1

## 2024-01-20 MED ORDER — POLYVINYL ALCOHOL 1.4 % OP SOLN: 1.0000 [drp] | Freq: Four times a day (QID) | OPHTHALMIC | Status: DC | PRN

## 2024-01-20 MED ORDER — FUROSEMIDE 10 MG/ML IJ SOLN
40.0000 mg | Freq: Once | INTRAMUSCULAR | Status: AC
  Administered 2024-01-20: 40 mg via INTRAVENOUS
  Filled 2024-01-20: qty 4

## 2024-01-20 MED ORDER — ALBUMIN HUMAN 25 % IV SOLN
12.5000 g | Freq: Once | INTRAVENOUS | Status: AC
  Administered 2024-01-20: 12.5 g via INTRAVENOUS
  Filled 2024-01-20: qty 50

## 2024-01-20 MED ORDER — HYDROMORPHONE HCL 1 MG/ML IJ SOLN
1.0000 mg | INTRAMUSCULAR | Status: DC | PRN
  Administered 2024-01-20: 1 mg via INTRAVENOUS
  Filled 2024-01-20: qty 1

## 2024-01-20 MED ORDER — NICARDIPINE HCL IN NACL 20-0.86 MG/200ML-% IV SOLN
3.0000 mg/h | INTRAVENOUS | Status: DC
  Filled 2024-01-20: qty 200

## 2024-01-20 MED ORDER — ALBUMIN HUMAN 25 % IV SOLN: 12.5000 g | Freq: Every day | INTRAVENOUS | Status: DC

## 2024-01-20 MED ORDER — FUROSEMIDE 10 MG/ML IJ SOLN: 40.0000 mg | Freq: Every day | INTRAMUSCULAR | Status: DC

## 2024-01-20 MED ORDER — MIDAZOLAM-SODIUM CHLORIDE 100-0.9 MG/100ML-% IV SOLN
0.5000 mg/h | INTRAVENOUS | Status: DC
  Administered 2024-01-20: 0.5 mg/h via INTRAVENOUS
  Administered 2024-01-21: 5 mg/h via INTRAVENOUS
  Filled 2024-01-20 (×2): qty 100

## 2024-01-20 MED ORDER — DEXTROSE 5 % IV SOLN: INTRAVENOUS | Status: DC

## 2024-01-20 MED ORDER — POTASSIUM CHLORIDE 10 MEQ/50ML IV SOLN
10.0000 meq | INTRAVENOUS | Status: DC
  Filled 2024-01-20 (×2): qty 50

## 2024-01-20 MED ORDER — MIDAZOLAM HCL (PF) 2 MG/2ML IJ SOLN: 4.0000 mg | Freq: Once | INTRAMUSCULAR | Status: DC

## 2024-01-20 NOTE — Progress Notes (Addendum)
 eLink Physician-Brief Progress Note Patient Name: Renee Gutierrez DOB: [birth year] MRN: [id number]   Date of Service  01/20/2024  HPI/Events of Note  Patient is still restless and hypertensive with minimal care activities on maximum Dilaudid  and propofol  infusions with Versed  pushes  eICU Interventions  Increase frequency of Dilaudid , dosage to 1 mg    0236 -seems that the patient had some propofol  leak and was disconnected for period of time.  Became hypertensive into the 250s with increased work of breathing despite Dilaudid  infusion.  When the propofol  was reconnected, patient seems to be responding with regards to blood pressure but still extremely dyssynchronous on the ventilator.  Will try to bridge with one-time push of Versed  4 mg.  If she is unresponsive to adequate sedation, will need to initiate a nicardapine drip.  Does not seem to have much response to Dilaudid  pushes but is much more comfortable with Versed  pushes.   Intervention Category Minor Interventions: Agitation / anxiety - evaluation and management  Terita Hejl 01/20/2024, 2:11 AM

## 2024-01-20 NOTE — Progress Notes (Signed)
 Daily Progress Note   Patient Name: Renee Gutierrez       Date: 01/20/2024 DOB: 1960/07/26  Age: 63 y.o. MRN#: [id number] Attending Physician: Neda Jennet LABOR, MD Primary Care Physician: Patient, No Pcp Per Admit Date: 01/10/2024 Length of Stay: 10 days  Reason for Consultation/Follow-up: Establishing goals of care  Subjective:   Reviewed EMR including recent documentation from [facility] provider and infectious disease provider.  Patient remains on ventilator support with issues of severe agitation.  Patient received Versed  push overnight to assist with agitation. Discussed care with bedside RN for medical updates.  ------------------------------------------------------------------------------------------------------------- Advance Care Planning Conversation  Pertinent diagnosis: Gastric/lower esophagus perforation with history of leak of Niesen fundoplication with proximal gastric ulcer status post EGD x 3, acute hypoxic respiratory failure due to aspiration, recurrent polymicrobial empyema, and right pneumothorax, sepsis secondary to gastric/esophageal perforation, bacteremia, acute metabolic encephalopathy, moderate protein calorie malnutrition, volume overload  The patient and  family consented to a voluntary Advance Care Planning Conversation in person. Individuals present for the conversation: Patient unable to participate in complex medical decision making due to patient's medical status.  Discussed care with patient's daughter, [name], who was discussing with patient's spouse over the phone.  Summary of the conversation:  Patient's daughter present at bedside who was discussing care with patient's husband over the phone.  Noted that upon presenting to bedside, patient appears uncomfortable and agitated.  Family has discussed care and noted that patient has been suffering from months and would not want to continue with aggressive medical interventions as this is not improving her  quality of life.  Family has discussed and opting for transition to comfort focused care at this time.  RN had already discussed with them what transition to comfort focused care would look like though spent time providing further details about palliative extubation.  Discussed would focus on patient's comfort at this time with medications to help her agitation and pain.  Daughter is trying to get family members to bedside to visit with patient.  Noted once family has had time to visit with patient, palliative extubation would be performed and would anticipate in-hospital death.  Daughter also going to reach out to their pastor about visiting.  Spent time answering questions as able.  Noted palliative medicine team continue to follow on the patient's medical journey.  Outcome of the conversations and/or documents completed:  Transition to comfort care at this time.  ET tube/ventilator support will remain in place until family has had a chance to come to bedside to visit with patient before palliative extubation is performed.  I spent 25 minutes providing separately identifiable ACP services with the patient and/or surrogate decision maker in a voluntary, in-person conversation discussing the patient's wishes and goals as detailed in the above note.  Tinnie Radar, DO Palliative Medicine Provider  -------------------------------------------------------------------------------------------------------------  Discussed care with PCCM providers and RN to coordinate care.  Awaiting family visiting patient before withdrawing ventilator support.  Objective:   Vital Signs:  BP (!) 113/57 (BP Location: Right Arm)   Pulse (!) 102   Temp 98.2 F (36.8 C) (Bladder)   Resp 18   Ht 4' 11 (1.499 m)   Wt 50.2 kg   SpO2 98%   BMI 22.35 kg/m   Physical Exam: General: Intubated, chronically ill-appearing Cardiovascular: Tachycardia noted, anasarca in all extremities Respiratory: no increased work of  breathing noted, not in respiratory distress Abdomen: not distended Neuro: Appears agitated, not following commands  Assessment & Plan:   Assessment:  Patient is a 63 y.o. female  with past medical history of HTN, fibromyalgia, Nissen fundoplication, GERD, and emphysema who presented to [facility] on 01/10/2024 with chest pain, somnolence and hypoxia. Found to have LL pneumonia, R secondary spontaneous pneumothorax, and concern for opiate toxidrome with ED course c/b worsening encephalopathy requiring invasive mechanical ventilation transferred to [facility] for CCM evaluation. Repeat CT chest during hospitalization showed extravasation into right chest.  Palliative medicine team consulted to assist with complex medical decision making.  Recommendations/Plan: # Complex medical decision making/goals of care:  -Patient unable to participate in medical decision making secondary to medical status.  -Spoke with patient's daughter, [name], who with discussing with patient's husband on the phone too.  Despite appropriate medical interventions, patient's medical status continues to deteriorate.  Family feels that the patient has been through enough family would not want to continue with aggressive medical interventions.  Discussed in transitioning to comfort focused care at this time.  Noted plan is for family to come to bedside as soon as able and once everyone has had a chance to visit with patient, then patient would have palliative extubation performed.  Leaving ET tube/ventilator support in place until family has had a chance to come to bedside to visit with patient.  Palliative medicine team continuing to follow along with patient's medical journey.  -At this time we will discontinue interventions that are no longer focused on comfort such as IV fluids, imaging, or lab work.  Will instead focus on symptom management of pain, dyspnea, and agitation in the setting of end-of-life care.  Will keep ET tube/ventilator in  place until family has had chance to come to bedside to say goodbye and then palliative extubation will be performed.    Code Status: Do not attempt resuscitation (DNR) - Comfort care  # Symptom management Patient is receiving these palliative interventions for symptom management with an intent to improve quality of life.     -Pain/Dyspnea, acute in the setting of end-of-life care                               -Continue IV Dilaudid  titratable infusion                  -Anxiety/agitation, in the setting of end-of-life care Staff and family have noted issues with severe agitation for patient.  Patient has responded well to Versed  boluses when received.                               -Start IV Versed  titratable infusion with bolus dosing.  Will continue to adjust maximum amount and bolus dosing on Versed  infusion as needed.                               -Start IV Haldol 1-2 mg every 4 hours as needed. Continue to adjust based on patient's symptom burden.     - Continue propofol  titratable infusion                 -Secretions, in the setting of end-of-life care                               -Start IV glycopyrrolate 0.2 mg every 4 hours as needed.  # Psychosocial Support:  -  Husband, daughter, son  # Discharge Planning: Anticipated Hospital Death  - Planning for palliative extubation once family has presented to bedside and had opportunity to visit with patient.  Thank you for allowing the palliative care team to participate in the care Levorn BROCKS Fury.  Tinnie Radar, DO Palliative Care Provider PMT # [phone]  If patient remains symptomatic despite maximum doses, please call PMT at [phone] between 0700 and 1900. Outside of these hours, please call attending, as PMT does not have night coverage.  Billing based on MDM: High  Problems Addressed: One or more chronic illnesses with severe exacerbation, progression, or side effects of treatment.  Risks: Parenteral controlled  substances

## 2024-01-20 NOTE — Plan of Care (Signed)
   Problem: Education: Goal: Knowledge of General Education information will improve Description Including pain rating scale, medication(s)/side effects and non-pharmacologic comfort measures Outcome: Progressing

## 2024-01-20 NOTE — Progress Notes (Signed)
 OT Cancellation Note  Patient Details Name: Renee Gutierrez MRN: [id number] DOB: [birth year]   Cancelled Treatment:    Reason Eval/Treat Not Completed: Medical issues which prohibited therapy  Patient continues to have medical issues limiting OT evaluation including vented and sedated. Will sign off services for now. Please re-c/s once medically ready for therapy. Thank you for this referral.  Geni OT/L Acute Rehabilitation Department  [phone]    01/20/2024, 8:24 AM

## 2024-01-20 NOTE — Progress Notes (Addendum)
 NAME:  Renee Gutierrez, MRN:  [id number], DOB:  16-Feb-1961, LOS: 10 ADMISSION DATE:  01/10/2024, CONSULTATION DATE:  @TODAY @ REFERRING MD:  [facility] ED, CHIEF COMPLAINT:  acute hypoxic respiratory failure   History of Present Illness:  [name] is a 63 y/o F with a PMH significant for HTN, fibromyalgia, GERD, emphysema who presented to [facility] ED for severe chest pain, somnolence, and hypoxia found to have RLL pneumonia, R secondary spontaneous pneumothorax, and concern for opiate toxidrome with ED course c/b worsening encephalopathy requiring invasive mechanical ventilation transferred to [facility] for CCM evaluation.  Per ED notes, the patient presented with somnolence, hypoxia, hypotension. Initial concern for opiate toxidrome. O2 SATS 69%. She was given small dose of narcan  with decent response, more alert and breathing, O2 sats improved. Patient in more pain. CTA chest performed and showed RLL consolidation, R pleural effusion, and pneumothorax. A R pigtail chest tube was placed. The patient initially required NE and request was made to transfer patient to ICU at [facility] or [facility]. She was also placed on a narcan  drip for opiate toxidrome.  On 11/22 AM, ED provider switched and patient had a respiratory decompensation, increasingly agitated, pulling on O2 and Ivs, more hypoxic. Decision was made to intubate patient and a femoral central line was placed. The patient was slated to come to [facility] for ICU level care.  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 and presence of a nonbleeding gastric ulcer measuring 2 cm and with a nonbleeding visible vessel which was ablated with bipolar probe  11/21 --> presented to [facility] ED, opiate toxidrome, narcan  drip, CTA chest showed R pneumo, chest tube placed 11/21 --> more altered, agitated, hypoxia, intubated, CVC placed, transferred to [facility] 11/23 lytics x 1 11/24 lytics x 2 11/25 extubated but reintubated by evening due to pigtail blockage & RT pneumo, flushed pigtail and removal of fibrinous clot 11/26 lytics #3 11/27 RT fem A -line >>, RT pigtail replaced>> 1.7 L of purulent fluid 11/28 Developed A-fib RVR overnight , failed Lopressor  and Cardizem, Amio drip added 11/28 esophagram negative but repeat CT chest after contrast shows extravasation into right chest, T CTS consulted 11/29 General Surgery, GI consultation, off pressors, hypertensive with agitation 11/30 Remains on amiodarone drip, Precedex, and Dilaudid   12/1 On high dose Dilaudid  and Propofol  infusion with frequent as needed Versed  pushes. CXR stable   Interim History / Subjective:  Sedated on vent. Gets very agitated with stimulation Daughter  at bedside.  +6L net since admit.  Objective    Blood pressure (!) 113/57, pulse (!) 102, temperature 98.2 F (36.8 C), temperature source Bladder, resp. rate 18, height 4' 11 (1.499 m), weight 50.2 kg, SpO2 98%.    Vent Mode: PRVC FiO2 (%):  [30 %-40 %] 30 % Set Rate:  [24 bmp] 24 bmp Vt Set:  [340 mL] 340 mL PEEP:  [5 cmH20] 5  cmH20 Plateau Pressure:  [15 cmH20-17 cmH20] 17 cmH20   Intake/Output Summary (Last 24 hours) at 01/20/2024 0819 Last data filed at 01/20/2024 9347 Gross per 24 hour  Intake 3201.8 ml  Output 2175 ml  Net 1026.8 ml   Filed Weights   01/18/24 0500 01/19/24 0500 01/20/24 0418  Weight: 48.3 kg 49.6 kg 50.2 kg    Examination: General: Adult female, deconditioned, critically ill. Neuro: Sedated on Propofol  + Dilaudid . Not responsive though gets very agitated with stimulation per RN (purposeful but not following commands). HEENT: [place]/AT. Sclerae anicteric. ETT in place. Cardiovascular: RRR, no M/R/G.  Lungs: Respirations even and unlabored.  CTA bilaterally, No W/R/R. Abdomen: BS x 4, soft, NT/ND.  Musculoskeletal: No gross deformities, no edema.   Resolved problem list  High anion gap metabolic acidosis RT Secondary Spontaneous Pneumothorax AKI   Assessment and Plan   High suspicion for gastric or lower esophagus perforation/leak with history of Nissen fundoplication at [facility] in 2011 and paraesophageal hernia with proximal gastric ulcer status post EGD x 3 -Imaging studies from 11/28 suggest perforation likely in the gastric part of the wrap, less likely from the esophagus, with low volume leak, causing right empyema and septic shock due to polymicrobial bacteremia/candidemia  -Last EGD 11/17 showed Nissen from duplication wrap intact, 3 cm paraesophageal hernia, nonbleeding gastric ulcer with clean base, biopsied P: -GI, TCTS and general surgery consultations have been obtained and all agree patient is not a surgical candidate and recommenced conservative measures  Acute Hypoxic Respiratory Failure due to aspiration, recurrent polymicrobial empyema, R pneumothorax. -Reintubated 11/25 due to right pneumothorax/pigtail blockage. Total s/p 3 doses intrapleural lytics as well as chest tube replacement 11/27.  - Has been intolerant of SBT due to severe agitation and work of breathing with  sedation wean P: Continue full vent support Continue to wean sedation as able thought his has proven challenging to try and manage waxing/waning delirium/agitation Bronchial hygiene VAP bundle in place  PAD protocol Antibiotics as below  Continue chest tube to -20cm suction Continue routine chest tube care  Sepsis due to gastric/esophageal perforation with polymicrobial empyema and aspiration pneumonia, candida glabrata and VRE enterococcal bacteremia. Shock physiology now resolved P: ID following, appreciate the assistance  Continue Daptomycin, Ceftriaxone , Flagyl , and Micafungin   Follow cultures   Acute Metabolic Encephalopathy  - 2/2 above, CT head negative  Chronic opiate use P: Supportive care  Moderate protein calorie nutrition Intractable nausea and vomiting with recurrent hospital admissions P:  Given gastric/esophageal perforation unable to use GI tract at this time  Patient needs TPN moving forward but unable to given fungemia and bacteremia Family aware of difficulties, considering comfort measures  Volume overload - +6L net since admit P: Will start Lasix  + Albumin  once to start, might need to change to daily depending on response Replete K daily as needed Follow BMP, I/Os  Anemia  -Hgb dropped to 6.6 am 12/1 P: Transfuse for Hgb < 7 Follow CBC  Multiple deep tissue pressure injuries  -See [facility] note 11/29 for location and stages  P: Local  wound care  Pressure alleviating devices  Goals of care  -See IPAL note 11/28, no CPR issued, she would not want tracheostomy but all other forms of resuscitation requested  - Family does not want to proceed with surgery at this point after explaining the procedure involved -Offered transfer to a tertiary facility, but family does not want to pursue, they would like her to be closer to home - Discussed recommendations with daughter in detail 11/30, current goal would be to see if we can get her to extubate over the next  2 to 3 days.  If so we would push forward with replacing central line and parenteral nutrition to see if this leak will resolve.  If on the other and we do not make progress then they would be agreeable to comfort care measures - Palliative care following, appreciate the assistance. Per last notes reviewed 12/1, family contemplating transition to comfort care in the upcoming days if no improvement. Fully agree with this recommendation as no meaningful options here unfortunately. I relayed this to daughter [name] at the bedside today 12/2 and she will discuss with pt's spouse as well as palliative care again today 12/2. Also discussed with RN at bedside.   Critical care: 35 min.    Sammi Gore, PA - [facility] For pager details, please see AMION or use Epic chat  After 1900, please call [facility] for cross coverage needs 01/20/2024, 8:40 AM

## 2024-01-21 DIAGNOSIS — Z515 Encounter for palliative care: Secondary | ICD-10-CM

## 2024-01-21 DIAGNOSIS — B49 Unspecified mycosis: Secondary | ICD-10-CM

## 2024-01-21 DIAGNOSIS — Z7189 Other specified counseling: Secondary | ICD-10-CM

## 2024-01-21 DIAGNOSIS — G9341 Metabolic encephalopathy: Secondary | ICD-10-CM

## 2024-01-21 DIAGNOSIS — G9340 Encephalopathy, unspecified: Secondary | ICD-10-CM

## 2024-01-21 DIAGNOSIS — R7881 Bacteremia: Secondary | ICD-10-CM

## 2024-01-21 MED ORDER — ORAL CARE MOUTH RINSE: 15.0000 mL | OROMUCOSAL | Status: DC

## 2024-01-23 LAB — CULTURE, BLOOD (ROUTINE X 2): Special Requests: ADEQUATE

## 2024-01-29 ENCOUNTER — Ambulatory Visit (INDEPENDENT_AMBULATORY_CARE_PROVIDER_SITE_OTHER): Admitting: Gastroenterology

## 2024-02-11 LAB — FUNGUS CULTURE RESULT

## 2024-02-11 LAB — FUNGAL ORGANISM REFLEX

## 2024-02-11 LAB — FUNGUS CULTURE WITH STAIN

## 2024-02-19 NOTE — Progress Notes (Signed)
 I met with Celestial's daughter, Renee Gutierrez to provide support.  She shared about her family and how they are coping.  I provided listening, as well as prayer, at her request.  She did not name any other needs at this time, but we will continue to check in on them as we are able.  Please also page as needs arise.

## 2024-02-19 NOTE — Progress Notes (Signed)
 Wasted 55mL of hydromorphone  gtt with Jenna Burdick, RN.

## 2024-02-19 NOTE — Progress Notes (Signed)
 NAME:  DEVANSHI CALIFF, MRN:  [id number], DOB:  Aug 24, 1960, LOS: 11 ADMISSION DATE:  01/10/2024, CONSULTATION DATE:  @TODAY @ REFERRING MD:  [facility], CHIEF COMPLAINT:  acute hypoxic respiratory failure   History of Present Illness:  Ms. State is a 64 y/o F with a PMH significant for HTN, fibromyalgia, GERD, emphysema who presented to [facility] ED for severe chest pain, somnolence, and hypoxia found to have RLL pneumonia, R secondary spontaneous pneumothorax, and concern for opiate toxidrome with ED course c/b worsening encephalopathy requiring invasive mechanical ventilation transferred to [facility] for CCM evaluation.  Per ED notes, the patient presented with somnolence, hypoxia, hypotension. Initial concern for opiate toxidrome. O2 SATS 69%. She was given small dose of narcan  with decent response, more alert and breathing, O2 sats improved. Patient in more pain. CTA chest performed and showed RLL consolidation, R pleural effusion, and pneumothorax. A R pigtail chest tube was placed. The patient initially required NE and request was made to transfer patient to ICU at [facility] or [facility]. She was also placed on a narcan  drip for opiate toxidrome.  On 11/22 AM, ED provider switched and patient had a respiratory decompensation, increasingly agitated, pulling on O2 and Ivs, more hypoxic. Decision was made to intubate patient and a femoral central line was placed. The patient was slated to come to [facility] for ICU level care.  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 and presence of a nonbleeding gastric ulcer measuring 2 cm and with a nonbleeding visible vessel which was ablated with bipolar probe  11/21 --> presented to [facility] ED, opiate toxidrome, narcan  drip, CTA chest showed R pneumo, chest tube placed 11/21 --> more altered, agitated, hypoxia, intubated, CVC placed, transferred to [facility] 11/23 lytics x 1 11/24 lytics x 2 11/25 extubated but reintubated by evening due to pigtail blockage & RT pneumo, flushed pigtail and removal of fibrinous clot 11/26 lytics #3 11/27 RT fem A -line >>, RT pigtail replaced>> 1.7 L of purulent fluid 11/28 Developed A-fib RVR overnight , failed Lopressor  and Cardizem, Amio drip added 11/28 esophagram negative but repeat CT chest after contrast shows extravasation into right chest, T CTS consulted 11/29 General Surgery, GI consultation, off pressors, hypertensive with agitation 11/30 Remains on amiodarone drip, Precedex, and Dilaudid   12/1 On high dose Dilaudid  and Propofol  infusion with frequent as needed Versed  pushes. CXR stable  12/2 decision made with palliative to transition to comfort measures once family arrives 12/3 family  planning on coming in around 1000 for compassionate extubation  Interim History / Subjective:  Comfortable on Propofol , Dilaudid , Versed . Discussed with RN.  Objective    Blood pressure 139/71, pulse 96, temperature 99.3 F (37.4 C), resp. rate (!) 24, height 4' 11 (1.499 m), weight 50.2 kg, SpO2 93%.    Vent Mode: PRVC FiO2 (%):  [30 %]  30 % Set Rate:  [24 bmp] 24 bmp Vt Set:  [340 mL] 340 mL PEEP:  [5 cmH20] 5 cmH20 Plateau Pressure:  [10 cmH20-23 cmH20] 17 cmH20   Intake/Output Summary (Last 24 hours) at  0747 Last data filed at  9351 Gross per 24 hour  Intake 2088.29 ml  Output 2625 ml  Net -536.71 ml   Filed Weights   01/18/24 0500 01/19/24 0500 01/20/24 0418  Weight: 48.3 kg 49.6 kg 50.2 kg    Examination: General: Adult female, deconditioned, critically ill. Neuro: Sedated on Propofol  + Dilaudid  + Midazolam . Not responsive. HEENT: [place]/AT. Sclerae anicteric. ETT in place. Cardiovascular: RRR, no M/R/G.  Lungs: Respirations even and unlabored.  CTA bilaterally, No W/R/R. Abdomen: BS x 4, soft, NT/ND.  Musculoskeletal: No gross deformities, no edema.   Resolved problem list  High anion gap metabolic acidosis RT Secondary Spontaneous Pneumothorax AKI   Assessment and Plan   High suspicion for gastric or lower esophagus perforation/leak with history of Nissen fundoplication at [facility] in 2011 and paraesophageal hernia with proximal gastric ulcer status post EGD x 3. -Imaging studies from 11/28 suggest perforation likely in the gastric part of the wrap, less likely from the esophagus, with low volume leak, causing right empyema and septic shock due to polymicrobial bacteremia/candidemia.  Acute Hypoxic Respiratory Failure due to aspiration, recurrent polymicrobial empyema, R pneumothorax. -Reintubated 11/25 due to right pneumothorax/pigtail blockage. Total s/p 3 doses intrapleural lytics as well as chest tube replacement 11/27.  - Has been intolerant of SBT due to severe agitation and work of breathing with sedation wean. Sepsis due to gastric/esophageal perforation with polymicrobial empyema and aspiration pneumonia, candida glabrata and VRE enterococcal bacteremia. Shock physiology now resolved. Acute Metabolic Encephalopathy  - 2/2 above, CT head negative.  Chronic opiate use. Moderate  protein calorie nutrition. Intractable nausea and vomiting with recurrent hospital admissions. Volume overload - +5.3L net since admit. Multiple deep tissue pressure injuries/heel injuries POA.   Discussion: Multiple multidisciplinary discussions with family regarding goals of care. Ultimately family met with palliative on 12/2 and opted to transition to full comfort measures once all family members arrive and have had a chance to visit with the pt. This is tentatively planned for 1000 today 12/3. Once family is ready, they will notify RN to proceed with carrying out compassionate extubation and full comfort measures. Orders already placed by palliative team.    Sammi Gore, PA - [facility] For pager details, please see AMION or use Epic chat  After 1900, please call [facility] for cross coverage needs , 7:47 AM

## 2024-02-19 NOTE — Progress Notes (Signed)
 Daily Progress Note   Patient Name: Renee Gutierrez       Date:  DOB: 06-Jul-1960  Age: 64 y.o. MRN#: [id number] Attending Physician: Neda Jennet LABOR, MD Primary Care Physician: Patient, No Pcp Per Admit Date: 01/10/2024 Length of Stay: 11 days  Reason for Consultation/Follow-up: Establishing goals of care  Subjective:   Reviewed EMR including recent documentation from [facility] provider.  Discussed care with bedside RN for medical updates.  Discussed management of patient's medications for agitation and comfort in setting of planning for palliative extubation.  Patient continuing to receive comfort focused care at this time.  Plan is for patient's family to present around 10 AM and will proceed palliative extubation once family has had time to visit. Family at bedside visiting when seeing patient. Able to discuss care with patient's daughter, Renee Gutierrez.  She noted that the last the family is coming in and planning for palliative extubation soon.  Spent time providing emotional support via active listening.  All questions answered at that time.  Palliative extubation ordered around 10:30 AM family's request.  Informed by RN patient's TOD was 1238.  Had discussed care with bedside RN and PCCM during the day to coordinate care.  Objective:   Vital Signs:  BP 139/71   Pulse 100   Temp 99.7 F (37.6 C)   Resp (!) 25   Ht 4' 11 (1.499 m)   Wt 50.2 kg   SpO2 92%   BMI 22.35 kg/m   Physical Exam: General: Intubated, chronically ill-appearing, unresponsive Cardiovascular: RRR, anasarca in all extremities Respiratory: Intubated on ventilator support Abdomen: not distended  Assessment & Plan:   Assessment: Patient is a 64 y.o. female  with past medical history of HTN, fibromyalgia, Nissen fundoplication, GERD, and emphysema who presented to [facility] on 01/10/2024 with chest pain, somnolence and hypoxia. Found to have LL pneumonia, R secondary spontaneous pneumothorax, and concern  for opiate toxidrome with ED course c/b worsening encephalopathy requiring invasive mechanical ventilation transferred to [facility] for CCM evaluation. Repeat CT chest during hospitalization showed extravasation into right chest.  Palliative medicine team consulted to assist with complex medical decision making.  Recommendations/Plan: # Complex medical decision making/goals of care:  -Patient unable to participate in medical decision making secondary to medical status.  -Spoke with patient's daughter, Renee Gutierrez, today as detailed above in HPI.  Patient has been receiving comfort focused care and family agreeing with supporting plan for palliative extubation with anticipated hospital death once family had presented at bedside and had a chance to visit with patient.  Informed of patient's TOD at 1238.    Code Status: Do not attempt resuscitation (DNR) - Comfort care  # Symptom management Patient is receiving these palliative interventions for symptom management with an intent to improve quality of life.     -Pain/Dyspnea, acute in the setting of end-of-life care                               -Continue IV Dilaudid  titratable infusion                  -Anxiety/agitation, in the setting of end-of-life care                               - Continue IV Versed  titratable infusion with bolus dosing.  Will continue to adjust maximum amount and bolus dosing on Versed  infusion  as needed.                               - Continue IV Haldol 1-2 mg every 4 hours as needed. Continue to adjust based on patient's symptom burden.     - Continue propofol  titratable infusion                 -Secretions, in the setting of end-of-life care                               - Continue IV glycopyrrolate 0.2 mg every 4 hours as needed.  # Psychosocial Support:  - Husband, daughter, son  # Discharge Planning: Anticipated Hospital Death  Thank you for allowing the palliative care team to participate in the care Renee BROCKS  Gutierrez.  Tinnie Radar, DO Palliative Care Provider PMT # [phone]  If patient remains symptomatic despite maximum doses, please call PMT at [phone] between 0700 and 1900. Outside of these hours, please call attending, as PMT does not have night coverage.  Billing based on MDM: High  Problems Addressed: One or more chronic illnesses with severe exacerbation, progression, or side effects of treatment.  Amount and/or Complexity of Data: Category 1:Review of prior external note(s) from each unique source and Assessment requiring an independent historian(s) and Category 3:Discussion of management or test interpretation with external physician/other qualified health care professional/appropriate source (not separately reported)  Risks: Parenteral controlled substances

## 2024-02-19 NOTE — Progress Notes (Signed)
 Chaplain visited with family - Lacole's husband and daughter primarily. Emotional/spiritual support via compassionate presence, reflective listening, grief support/education, and prayer.

## 2024-02-19 NOTE — Death Summary Note (Signed)
 DEATH SUMMARY   Patient Details  Name: Renee Gutierrez MRN: [id number] DOB: 11/04/1960  Admission/Discharge Information   Admit Date:  Feb 04, 2024  Date of Death: Date of Death: 02-15-24  Time of Death: Time of Death: 02/17/37  Length of Stay: 2024-02-23  Referring Physician: Patient, No Pcp Per   Reason(s) for Hospitalization  Hypoxic respiratory failure, metabolic encephalopathy, sepsis with bacteremia and fungemia 2/2 presumed esophageal perforation, failure to thrive.  Diagnoses  Preliminary cause of death:  Secondary Diagnoses (including complications and co-morbidities):  Principal Problem:   Incarcerated hiatal hernia with ulcer at cardia and perforation/leak Active Problems:   CKD (chronic kidney disease), stage II   Peptic ulcer within a sliding hiatal hernia   Septic shock (HCC)   Acute hypoxic respiratory failure (HCC)   Empyema (HCC)   Severe protein-calorie malnutrition   Chronic gastric ulcer (cardia) with perforation/leak   Cachexia   Severe chronic obstructive pulmonary disease (HCC)   Spontaneous pneumothorax s/p right chest tube   Malnutrition of moderate degree   VRE (vancomycin-resistant Enterococci) infection   Yeast infection   Acute renal failure   Pneumonia due to infectious organism   DNR (do not resuscitate)   Pain   High risk medication use   Agitation   Medication management   Pneumothorax   ACP (advance care planning)   Palliative care encounter   Goals of care, counseling/discussion   End of life care   Brief Hospital Course (including significant findings, care, treatment, and services provided and events leading to death)  Renee Gutierrez is a 64 y.o. year old female who has a PMH including but not limited to HTN, fibromyalgia, Nissen fundoplication in 02-17-10, GERD, and emphysema who presented to [facility] on 02-04-2024 with chest pain, somnolence and hypoxia. She was found to have pneumonia, R secondary spontaneous pneumothorax, and concern for  possible opiate toxidrome with ED course c/b worsening encephalopathy requiring intubation and mechanical ventilation. She was later transferred/admitted to [facility]. During her admission, she had concerns for esophageal perforation. Esophagram and CT after contrast were obtained which did support a perforation with extravasation of contrast into R pleural space. Unfortunately she developed sepsis from polymicrobial empyema and aspiration pneumonia, candida glabrata and VRE enterococcal bacteremia. Her case was reviewed by TCTS, GI, Surgery and all did not feel that she was an appropriate surgical candidate. She had difficulty weaning from the ventilator due to encephalopathy and agitation. Palliative care was introduced to the family and after discussions, family made it clear that pt had been suffering for quite some time and had made it known that she would never want to rely on life support measures to keep her alive. On 2024/02/15, family all visited with pt at the bedside as they transitioned to full comfort measures. Pt was compassionately extubated and shortly later, she passed peacefully with family at her bedside. See below for a brief summary of the significant events of her hospital course.   Hospital Course: 11/21 --> presented to [facility] ED, opiate toxidrome, narcan  drip, CTA chest showed R pneumo, chest tube placed 11/21--> more altered, agitated, hypoxia, intubated, CVC placed February 04, 2024 admit to [facility] 11/23 lytics x 1 11/24 lytics x 2 11/25 extubated but reintubated by evening due to pigtail blockage & RT pneumo, flushed pigtail and removal of fibrinous clot 11/26 lytics #3 11/27 RT fem A -line >>, RT pigtail replaced>> 1.7 L of purulent fluid 11/28 Developed A-fib RVR overnight , failed Lopressor  and Cardizem, Amio drip added 11/28 given large  recurrent effusion with purulent output suggestive of empyema along with candidemia, case discussed with GI and TCTS for concern of esophageal perforation. Esophogram  was recommended which was negative. Repeat CT chest subsequently obtained after contrast and showed extravasation into right pleural space, TCTS reviewed and deemed to not be surgical candidate at that time 11/29 General Surgery/GI/Palliative care consults 12/1 On high dose Dilaudid  and Propofol  infusion with frequent as needed Versed  pushes given ongoing agitation 12/2 decision made with palliative to transition to comfort measures once family arrives (family had mentioned that pt had been deteriorating for months and had not wanted to pursue ongoing aggressive measures without improvement) 12/3 family planning on coming in around 1000 for compassionate extubation 12/3 pt passed peacefully with family at bedside   Pertinent Labs and Studies  Significant Diagnostic Studies DG Chest Port 1 View Result Date: 01/19/2024 EXAM: 1 VIEW(S) XRAY OF THE CHEST 01/19/2024 07:35:00 AM COMPARISON: 01/18/2024 CLINICAL HISTORY: Acute respiratory failure (HCC) FINDINGS: LINES, TUBES AND DEVICES: Endotracheal tube in place with tip 4 cm above the carina. Enteric tube in place extending into the stomach. Stable right basilar pleural catheter. LUNGS AND PLEURA: Increased patchy airspace opacities in the lung bases. Small right pleural effusion, unchanged. No pneumothorax. HEART AND MEDIASTINUM: No acute abnormality of the cardiac and mediastinal silhouettes. BONES AND SOFT TISSUES: No acute osseous abnormality. IMPRESSION: 1. slightly Increased patchy basilar airspace opacities 2. Small right pleural effusion, unchanged. Electronically signed by: Katheleen Faes MD 01/19/2024 08:42 AM EST RP Workstation: [id number]   DG Chest Port 1 View Result Date: 01/18/2024 CLINICAL DATA:  Acute respiratory failure EXAM: PORTABLE CHEST 1 VIEW COMPARISON:  Chest radiograph dated 01/17/2024 FINDINGS: Lines/tubes: Endotracheal tube tip projects 3.5 cm above the carina. Gastric/enteric tube tip projects over the stomach. Right basilar  pleural catheter in-situ. Lungs: Mildly low right lung volumes. Increased patchy right lower lung opacity. Minimal patchy left basilar opacity. Pleura: Small right pleural effusion.  No pneumothorax. Heart/mediastinum: Enlarged cardiomediastinal silhouette is likely projectional. Bones: Cervical spinal fixation hardware appears intact. IMPRESSION: 1. Increased patchy right lower lung opacity, which may represent atelectasis, aspiration, or pneumonia. 2. Small right pleural effusion with right basilar pleural catheter in-situ. No pneumothorax. Electronically Signed   By: Limin  Xu M.D.   On: 01/18/2024 11:39   DG Chest Port 1 View Result Date: 01/17/2024 CLINICAL DATA:  Acute respiratory failure. Follow-up pneumothorax. On ventilator. EXAM: PORTABLE CHEST 1 VIEW COMPARISON:  01/16/2024 FINDINGS: Patient is significantly rotated to the right which limits evaluation. Endotracheal tube remains in appropriate position. A right-sided pleural catheter is seen overlying the right lung base. No definite pneumothorax is seen on today's exam which is limited by positioning. Decreased infiltrate or atelectasis is seen in the right lung base. Left lung remains clear. Heart size within normal limits. IMPRESSION: Limited exam due to positioning. No definite pneumothorax visualized on today's exam. Decreased right basilar infiltrate or atelectasis. Electronically Signed   By: Norleen DELENA Kil M.D.   On: 01/17/2024 05:40   DG Abd 1 View Result Date: 01/16/2024 EXAM: 1 VIEW XRAY OF THE ABDOMEN 01/16/2024 07:29:00 PM COMPARISON: 01/10/2024 CLINICAL HISTORY: Encounter for imaging study to confirm nasogastric (NG) tube placement. FINDINGS: LINES, TUBES AND DEVICES: Enteric tube tip and side port in satisfactory position. Right chest tube noted. Right femoral line noted. BOWEL: Nonobstructive bowel gas pattern. SOFT TISSUES: Cholecystectomy clips noted. No opaque urinary calculi. BONES: Levoscoliosis of the lumbar spine. No acute  osseous abnormality. PLEURAL SPACES: Right pleural effusion. IMPRESSION:  1. Enteric tube tip and side port in satisfactory position. Electronically signed by: Norman Gatlin MD 01/16/2024 07:37 PM EST RP Workstation: [id number]   CT CHEST WO CONTRAST Result Date: 01/16/2024 CLINICAL DATA:  Assess for esophageal tear. EXAM: CT CHEST WITHOUT CONTRAST TECHNIQUE: Multidetector CT imaging of the chest was performed following the standard protocol without IV contrast. RADIATION DOSE REDUCTION: This exam was performed according to the departmental dose-optimization program which includes automated exposure control, adjustment of the mA and/or kV according to patient size and/or use of iterative reconstruction technique. COMPARISON:  Esophagram same day.  CT chest 01/15/2024. FINDINGS: Cardiovascular: The heart is mildly enlarged. The aorta is normal in size. There are atherosclerotic calcifications of the aorta. There is no pericardial effusion. Mediastinum/Nodes: The endotracheal tube tip is proximally 3.3 cm above the carina. Enteric tube is seen throughout nondilated esophagus. Hiatal hernia containing Nissen fundoplication likely present. There is oral contrast seen within the region of the Nissen fundoplication and stomach likely related to recent esophagram. There is a small column of contrast extravasating from the right side of the Nissen fundoplication area within the hiatal hernia on image 2/113 with minimal contrast extending into the adjacent right pleural space. There is no evidence for pneumomediastinum. No enlarged lymph nodes are seen. Lungs/Pleura: There is a small layering right pleural effusion which has significantly decreased from prior. Right pleural drainage catheter is in place. There is a small amount of air in the right pleural space likely related to the catheter. Patchy airspace and ground-glass opacities are seen in the bilateral lower lobes, right middle lobe and bilateral upper lobes.  There is mild emphysema. There is also some likely new aspirated oral contrast within peripheral right lower lobe bronchi. Upper Abdomen: No acute abnormality. Musculoskeletal: No chest wall mass or suspicious bone lesions identified. IMPRESSION: 1. Small column of contrast extravasating from the right side of the Nissen fundoplication area within the hiatal hernia with minimal contrast extending into the adjacent right pleural space. Findings are compatible with small leak. 2. Small right pleural effusion has significantly decreased from prior. Right pleural drainage catheter is in place. 3. Patchy airspace and ground-glass opacities in the bilateral lower lobes, right middle lobe and bilateral upper lobes worrisome for multifocal pneumonia. 4. New aspirated oral contrast within peripheral right lower lobe bronchi. Aortic Atherosclerosis (ICD10-I70.0) and Emphysema (ICD10-J43.9). These results were called by telephone at the time of interpretation on 01/16/2024 at 6:35 pm to provider [facility] , who verbally acknowledged these results. Electronically Signed   By: Greig Pique M.D.   On: 01/16/2024 18:35   DG CHEST PORT 1 VIEW Result Date: 01/16/2024 CLINICAL DATA:  Enteric tube placement EXAM: PORTABLE CHEST 1 VIEW COMPARISON:  Chest radiograph dated 01/15/2024 FINDINGS: Lines/tubes: Endotracheal tube tip projects 3.0 cm above the carina. Gastric/enteric tube has been retracted and tip projects over the mid esophagus. Right basilar pleural catheter in-situ, unchanged. Lungs: Slightly increased diffuse patchy opacities, right-greater-than-left. Pleura: Unchanged trace right pleural effusion. Possible trace pneumothorax component at the right apex. Heart/mediastinum: The heart size and mediastinal contours are within normal limits. Bones: No acute osseous abnormality. Right upper quadrant surgical clips. Cervical spinal fixation hardware appears intact. IMPRESSION: 1. Gastric/enteric tube has been retracted  and tip projects over the mid esophagus. 2. Endotracheal tube tip projects 3.0 cm above the carina. 3. Slightly increased diffuse patchy opacities, right-greater-than-left. 4. Unchanged trace right pleural effusion. Possible trace pneumothorax component at the right apex, better evaluated on subsequent chest CT.  Electronically Signed   By: Limin  Xu M.D.   On: 01/16/2024 15:02   DG ESOPHAGUS W SINGLE CM (SOL OR THIN BA) Result Date: 01/16/2024 EXAM: SINGLE CONTRAST ESOPHAGRAM 01/16/2024 12:15:00 PM TECHNIQUE: Multiple single contrast images of the esophagus and gastroesophageal junction were obtained following the oral administration of water  soluble contrast. The patient is endotracheally intubated. Today's examination is performed through the nasogastric tube, the tip of which is in the mid esophagus. Approximately 35 cc of Omnipaque  300 was injected through the feeding tube into the esophagus. Much of this extended proximally towards the oral cavity, although some slowly percolated through the distal esophagus into the stomach. Various maneuvers including tilting the table up to 10 degrees and turning of the patient was performed to depict the distal esophagus. Some of the contrast was gently suctioned from the esophagus back through the nasogastric tube, and the nasogastric tube was flushed with water . FLUOROSCOPY DOSE AND TYPE: Radiation Dose Index: Reference Air Kerma (in mGy) = 20.1 COMPARISON: CT abdomen from 12/29/2023. CLINICAL HISTORY: Right pneumonia and pneumothorax, assessment for esophageal perforation. FINDINGS: The nasogastric tube tip is in the mid esophagus. Following injection of approximately 35 cc of Omnipaque  300 through the nasogastric tube, contrast extended proximally towards the oral cavity and slowly percolated through the distal esophagus into the stomach. Contrast medium flows from the distal esophagus into the stomach with secondary and tertiary esophageal contractions noted. The  appearance of curvilinear contrast extending in this region from the stomach into the lower chest on series 3 is compatible with the appearance of the fundoplication wrap, for example on the CT abdomen from 12/29/2023, and is not felt to be indicative of a leak. This fundoplication wrap only filled in a very minimal manner; there was no feasible way to encourage more filling. Overall no esophageal leak was demonstrated on today's exam. No stricture or obstruction. IMPRESSION: 1. No esophageal perforation demonstrated limited assessment. Electronically signed by: Ryan Salvage MD 01/16/2024 12:32 PM EST RP Workstation: [id number]   DG CHEST PORT 1 VIEW Result Date: 01/15/2024 CLINICAL DATA:  And highly [place] EXAM: PORTABLE CHEST 1 VIEW COMPARISON:  01/15/2024 FINDINGS: Single frontal view of the chest demonstrates repositioning or replacement of the right pleural drainage catheter, now coiled over the right lower hemithorax. The right-sided hydropneumothorax noted previously has diminished significantly after replacement/repositioning of the catheter. Multifocal bilateral airspace disease, right greater than left, again noted. Cardiac silhouette is unremarkable. Endotracheal tube and enteric catheter are again noted. IMPRESSION: 1. Marked reduction in the loculated right hydropneumothorax noted on prior exam after repositioning/replacement of the right-sided pigtail pleural drainage catheter. Catheter is now coiled over the right lung base. 2. Persistent multifocal bilateral airspace disease consistent with pneumonia. Electronically Signed   By: Ozell Daring M.D.   On: 01/15/2024 17:24   CT CHEST WO CONTRAST Result Date: 01/15/2024 CLINICAL DATA:  Pneumonia complicated by empyema status post chest tube placement EXAM: CT CHEST WITHOUT CONTRAST TECHNIQUE: Multidetector CT imaging of the chest was performed following the standard protocol without IV contrast. RADIATION DOSE REDUCTION: This exam was  performed according to the departmental dose-optimization program which includes automated exposure control, adjustment of the mA and/or kV according to patient size and/or use of iterative reconstruction technique. COMPARISON:  CTA chest dated 01/10/2024 FINDINGS: Cardiovascular: Normal heart size. No significant pericardial fluid/thickening. Great vessels are normal in course and caliber. Aortic atherosclerosis. Mediastinum/Nodes: Imaged thyroid gland without nodules meeting criteria for imaging follow-up by size. Small hiatal hernia. Unchanged  multi station mediastinal lymphadenopathy, for example 11 mm precarinal (2:60). Lungs/Pleura: ETT tip terminates 4.0 cm above the carina. Right lateral approach pleural catheter is retracted within the subcutaneous soft tissues of the right inferolateral chest. Secretions within the trachea central airways are otherwise patent. Increased multifocal consolidations and ground-glass opacities. Large right pleural effusion. Decreased trace right pneumothorax. Upper abdomen: Cholecystectomy. Partially imaged bilateral adrenal thickening. Musculoskeletal: No acute or abnormal lytic or blastic osseous lesions. Cervical spinal fusion hardware appears intact. Well corticated triangular radiodensity at the anterior inferior aspect of the left glenoid (8:23), which may reflect sequela of remote injury. IMPRESSION: 1. Right lateral approach pleural catheter tip is retracted within the subcutaneous soft tissues of the right inferolateral chest. 2. Increased multifocal consolidations and ground-glass opacities, likely worsening multifocal pneumonia. 3. Large right pleural effusion. Decreased trace right pneumothorax. 4. Unchanged multi station mediastinal lymphadenopathy, likely reactive. 5.  Aortic Atherosclerosis (ICD10-I70.0). These results will be called to the ordering clinician or representative by the Radiologist Assistant, and communication documented in the PACS or [employer]. Electronically Signed   By: Limin  Xu M.D.   On: 01/15/2024 15:44   DG Chest Port 1 View Result Date: 01/15/2024 EXAM: 1 [facility] VIEW(S) XRAY OF THE CHEST 01/15/2024 05:35:00 AM COMPARISON: Portable chest 01/14/2024 03:20 PM. CLINICAL HISTORY: 5626 Acute respiratory failure (HCC) 5626 Acute respiratory failure (HCC) FINDINGS: LINES, TUBES AND DEVICES: Right pigtail chest tube with pigtail in the chest base is unchanged. Endotracheal tube (ETT) tip is 3.6 cm from the carina. Nasogastric tube (NGT) is well inside the stomach with the full intragastric contents out of view. LUNGS AND PLEURA: Right greater than left interstitial and patchy airspace disease with a widespread distribution and relative left apical sparing continues to be seen. There is slight improvement in the opacities on the left, but no change on the right. Small to moderate right pleural effusion is seen. No significant left pleural effusion. No pneumothorax. HEART AND MEDIASTINUM: The mediastinum is stable. There is cardiomegaly without gross vascular congestion. BONES AND SOFT TISSUES: No acute osseous abnormality. IMPRESSION: 1. Right greater than left interstitial and patchy airspace disease with widespread distribution and relative left apical sparing, with slight improvement on the left and no change on the right. 2. Small to moderate right pleural effusion. 3. Cardiomegaly without gross vascular congestion. 4. Right apical pneumothorax is not seen today. Pigtail chest tube remains in place. Electronically signed by: Francis Quam MD 01/15/2024 08:01 AM EST RP Workstation: [id number]   US  EKG SITE RITE Result Date: 01/14/2024 If Site Rite image not attached, placement could not be confirmed due to current cardiac rhythm.  DG CHEST PORT 1 VIEW Result Date: 01/14/2024 CLINICAL DATA:  Hypoxia, chest tube re-evaluation EXAM: PORTABLE CHEST 1 VIEW COMPARISON:  01/14/2024 at 4:58 a.m. FINDINGS: Single frontal view of the chest  demonstrates stable endotracheal tube overlying tracheal air column, tip proximally 3 cm above carina. Enteric catheter passes below diaphragm, tip excluded by collimation. Right-sided chest tube coiled over the right lung base, stable. Widespread bilateral ground-glass airspace disease with relative sparing of the left apex, stable. Right pleural effusion, unchanged allowing for differences in positioning and technique. Stable small right apical pneumothorax, with pleural separation measuring 9 mm, previously measuring 10 mm. No acute bony abnormalities. IMPRESSION: 1. Stable small right apical pneumothorax volume estimated less than 10%. 2. Persistent right pleural effusion and indwelling right-sided pleural drainage catheter. 3. Multifocal bilateral ground-glass airspace disease, favor edema over infection or hemorrhage. 4. Support  devices as above. Electronically Signed   By: Ozell Daring M.D.   On: 01/14/2024 16:55   VAS US  UPPER EXTREMITY VENOUS DUPLEX Result Date: 01/14/2024 UPPER VENOUS STUDY  Patient Name:  JINX GILDEN Tith  Date of Exam:   01/13/2024 Medical Rec #: [id number]          Accession #:    [id number] Date of Birth: 05-19-1960           Patient Gender: F Patient Age:   79 years Exam Location:  [facility] Procedure:      VAS US  UPPER EXTREMITY VENOUS DUPLEX Referring Phys: RAKESH ALVA --------------------------------------------------------------------------------  Indications: Swelling, and chest pain, hypoxia Comparison Study: No prior exam. Performing Technologist: Edilia Elden Appl  Examination Guidelines: A complete evaluation includes B-mode imaging, spectral Doppler, color Doppler, and power Doppler as needed of all accessible portions of each vessel. Bilateral testing is considered an integral part of a complete examination. Limited examinations for reoccurring indications may be performed as noted.  Right Findings:  +----------+------------+---------+-----------+----------+-------+ RIGHT     CompressiblePhasicitySpontaneousPropertiesSummary +----------+------------+---------+-----------+----------+-------+ IJV           Full       Yes       Yes                      +----------+------------+---------+-----------+----------+-------+ Subclavian    Full       Yes       Yes                      +----------+------------+---------+-----------+----------+-------+  Left Findings: +----------+------------+---------+-----------+----------+--------------+ LEFT      CompressiblePhasicitySpontaneousProperties   Summary     +----------+------------+---------+-----------+----------+--------------+ IJV           Full       Yes       Yes                             +----------+------------+---------+-----------+----------+--------------+ Subclavian    Full       Yes       Yes                             +----------+------------+---------+-----------+----------+--------------+ Axillary      Full       Yes       Yes                             +----------+------------+---------+-----------+----------+--------------+ Brachial      Full       Yes       Yes                             +----------+------------+---------+-----------+----------+--------------+ Radial        Full                                                 +----------+------------+---------+-----------+----------+--------------+ Ulnar  Not visualized +----------+------------+---------+-----------+----------+--------------+ Cephalic      None       No        No                              +----------+------------+---------+-----------+----------+--------------+ Basilic       Full       Yes       Yes                             +----------+------------+---------+-----------+----------+--------------+ Superficial vein thrombosis noted in the cephalic vein  from the shoulder to the antecubital fossa.  Summary:  Right: No evidence of thrombosis in the subclavian.  Left: No evidence of deep vein thrombosis in the upper extremity. Findings consistent with acute superficial vein thrombosis involving the left cephalic vein.  *See table(s) above for measurements and observations.  Diagnosing physician: Lonni Gaskins MD Electronically signed by Lonni Gaskins MD on 01/14/2024 at 7:49:42 AM.    Final    DG Chest Port 1 View Result Date: 01/14/2024 EXAM: 1 VIEW(S) XRAY OF THE CHEST 01/14/2024 05:06:00 AM COMPARISON: Portable chest today at 2:03 am. CLINICAL HISTORY: 5626 Acute respiratory failure (HCC) 5626 Acute respiratory failure (HCC). Right chest tube in place for pneumothorax treatment. FINDINGS: LINES, TUBES AND DEVICES: There is a pigtail tube thoracostomy in the right chest base. ETT tip is 2.6 cm from the carina. NGT passes well into the stomach but the side hole and tip are out of view. LUNGS AND PLEURA: Right apical pneumothorax is improved. Pleural parenchymal separation on the prior study was 2 cm; now it is 1 cm. There is a moderate right and small left pleural effusions. Moderate interstitial edema with interval worsening. Airspace disease is slightly denser and more confluent in the left lung and interval increased in the right upper lung field. Right lower lung field largely obscured by the pleural effusion. HEART AND MEDIASTINUM: There is mild cardiomegaly, central vascular prominence. No mediastinal shift. BONES AND SOFT TISSUES: 2-level lower cervical ACDF plating is again shown. No new osseous findings. IMPRESSION: 1. Improved right apical pneumothorax with pigtail tube thoracostomy in the right chest base and no mediastinal shift. 2. Moderate interstitial edema with interval worsening. 3. Airspace disease denser and more confluent in the left lung with interval increase in the right upper lung field. Right lower lung field largely obscured by  the pleural effusion. 4. Moderate right and small left pleural effusions. Electronically signed by: Francis Quam MD 01/14/2024 06:04 AM EST RP Workstation: [id number]   DG CHEST PORT 1 VIEW Result Date: 01/14/2024 EXAM: 1 VIEW(S) XRAY OF THE CHEST 01/14/2024 02:12:00 AM COMPARISON: 01/14/2024 CLINICAL HISTORY: Impaired oxygenation; Pneumothorax FINDINGS: LINES, TUBES AND DEVICES: Right basilar chest tube remains in place, unchanged. Endotracheal tube and then g-tube are unchanged. LUNGS AND PLEURA: Bilateral airspace disease, unchanged. Layering right pleural effusion. No visible right pneumothorax. HEART AND MEDIASTINUM: No acute abnormality of the cardiac and mediastinal silhouettes. BONES AND SOFT TISSUES: No acute osseous abnormality. IMPRESSION: 1. No visible right pneumothorax. 2. Bilateral airspace disease, unchanged. 3. Layering right pleural effusion. Electronically signed by: Franky Crease MD 01/14/2024 02:16 AM EST RP Workstation: [id number]   DG CHEST PORT 1 VIEW Result Date: 01/14/2024 EXAM: 1 VIEW(S) XRAY OF THE CHEST 01/14/2024 12:57:00 AM COMPARISON: 01/14/2024 CLINICAL HISTORY: Acute respiratory failure requiring reintubation (HCC). FINDINGS: LINES, TUBES AND DEVICES: Endotracheal tube in  place with tip 3 cm above the carina. Enteric tube in place, coursing below the diaphragm. Right chest tube in place. LUNGS AND PLEURA: Decreased right pneumothorax. Improved right lung aeration with small right pleural effusion and hazy opacities. Stable patchy airspace opacities in left lung. No pneumothorax on the left. HEART AND MEDIASTINUM: No acute abnormality of the cardiac and mediastinal silhouettes. BONES AND SOFT TISSUES: Cervical spinal fusion hardware in place. No acute osseous abnormality. IMPRESSION: 1. Decreased right pneumothorax with improved right lung aeration; small right pleural effusion and hazy opacities persist. 2. Stable patchy airspace opacities in the left lung. Electronically  signed by: Oneil Devonshire MD 01/14/2024 01:12 AM EST RP Workstation: [id number]   DG CHEST PORT 1 VIEW Result Date: 01/14/2024 EXAM: 1 VIEW(S) XRAY OF THE CHEST 01/14/2024 12:13:18 AM COMPARISON: Comparison with 01/13/2024. CLINICAL HISTORY: [address] Hypoxemia [phone] Hypoxemia FINDINGS: LINES, TUBES AND DEVICES: Right chest tube is in place. Enteric tube tip is off the field of view but below the left hemidiaphragm. LUNGS AND PLEURA: Increasing right pneumothorax and right pleural effusion since prior study. Consolidation in the aerated portion of the right lung. Developing perihilar infiltration on the left suggesting pneumonia, aspiration, or edema. HEART AND MEDIASTINUM: Calcification of the aorta. BONES AND SOFT TISSUES: Postoperative changes in the cervical spine. Results will be telephoned to the referring service by the radiology assistant and documented in the PACS dashboard. IMPRESSION: 1. Increasing right pneumothorax and right pleural effusion since prior study, with right chest tube in place. 2. Consolidation in the aerated portion of the right lung. 3. Developing perihilar infiltration on the left, suggesting pneumonia, aspiration, or edema. Electronically signed by: Elsie Gravely MD 01/14/2024 12:19 AM EST RP Workstation: [id number]   DG Chest Port 1 View Result Date: 01/13/2024 CLINICAL DATA:  2 respiratory failure. EXAM: PORTABLE CHEST 1 VIEW COMPARISON:  01/12/2024 FINDINGS: Endotracheal tube tip is 2.7 cm above the base of the carina. The NG tube passes into the stomach although the distal tip position is not included on the film. Persistent minimal atelectasis at the left base. Right chest tube remains in place without discernible pleural line to suggest residual pneumothorax. Layering right pleural fluid again noted with linear densities in the right base suggesting atelectasis or scarring. IMPRESSION: 1. Endotracheal tube tip is 2.7 cm above the base of the carina. 2. Right chest tube remains  in place without discernible pneumothorax. 3. Persistent layering right pleural effusion with right base atelectasis or scarring. Electronically Signed   By: Camellia Candle M.D.   On: 01/13/2024 06:22   DG CHEST PORT 1 VIEW Result Date: 01/12/2024 EXAM: 1 VIEW(S) XRAY OF THE CHEST 01/12/2024 09:39:14 AM COMPARISON: 01/11/2024 CLINICAL HISTORY: Empyema lung (HCC) FINDINGS: LINES, TUBES AND DEVICES: ETT in place with tip 1.8 cm above the carina. Enteric tube in place with tip projecting over distal stomach. Stable right basilar chest tube. LUNGS AND PLEURA: Small moderate right pleural effusion, stable compared to prior. Increased right basilar opacity although possibly from layering of the pleural effusion. Left retrocardiac atelectasis similar to previous. No pneumothorax. HEART AND MEDIASTINUM: Atheromatous vascular calcification of the aortic arch. No acute abnormality of the cardiac and mediastinal silhouettes. BONES AND SOFT TISSUES: Cervical spinal fusion hardware in place. No acute osseous abnormality. IMPRESSION: 1. Small to moderate right pleural effusion, stable compared to prior. 2. Increased right basilar opacity, possibly related to layering of the pleural effusion. 3. Left retrocardiac atelectasis, similar to previous. 4. Cervical spinal fusion hardware in place. Electronically  signed by: Ryan Salvage MD 01/12/2024 08:29 PM EST RP Workstation: [id number]   DG CHEST PORT 1 VIEW Result Date: 01/11/2024 EXAM: 1 VIEW(S) XRAY OF THE CHEST 01/11/2024 05:35:00 PM COMPARISON: 01/11/2024 CLINICAL HISTORY: 200808 Hypoxia [id number] FINDINGS: LINES, TUBES AND DEVICES: The endotracheal tube is 2.4 cm above the carina. A chest tube remains in place at the right base of the chest. NG tube is in the stomach. LUNGS AND PLEURA: Bibasilar opacities, right greater than left, likely atelectasis. Small right pleural effusion. No pneumothorax is present. HEART AND MEDIASTINUM: No acute abnormality of the cardiac and  mediastinal silhouettes. BONES AND SOFT TISSUES: No acute osseous abnormality. IMPRESSION: 1. Bibasilar opacities, right greater than left, likely atelectasis. 2. Small right pleural effusion. Electronically signed by: Franky Crease MD 01/11/2024 05:51 PM EST RP Workstation: [id number]   DG CHEST PORT 1 VIEW Result Date: 01/11/2024 EXAM: 1 VIEW(S) XRAY OF THE CHEST 01/11/2024 07:59:00 AM COMPARISON: 01/10/2024 CLINICAL HISTORY: Pneumothorax FINDINGS: LINES, TUBES AND DEVICES: Stable endotracheal tube and enteric tube. Stable right basilar chest tube. LUNGS AND PLEURA: Stable small bilateral pleural effusions. Stable retrocardiac airspace opacity. No pneumothorax. HEART AND MEDIASTINUM: No acute abnormality of the cardiac and mediastinal silhouettes. BONES AND SOFT TISSUES: Cholecystectomy clips noted. Levocurvature of lumbar spine. Cervical spinal fusion hardware in place. No acute osseous abnormality. IMPRESSION: 1. Stable right basilar chest tube with no new pneumothorax identified. 2. Stable small bilateral pleural effusions. 3. Stable retrocardiac airspace opacity. Electronically signed by: Waddell Calk MD 01/11/2024 08:07 AM EST RP Workstation: [id number]   ECHOCARDIOGRAM COMPLETE Result Date: 01/10/2024    ECHOCARDIOGRAM REPORT   Patient Name:   LEONI GOODNESS Kliewer Date of Exam: 01/10/2024 Medical Rec #:  [id number]         Height:       59.0 in Accession #:    [id number]        Weight:       101.9 lb Date of Birth:  1960/11/15          BSA:          1.385 m Patient Age:    63 years          BP:           139/47 mmHg Patient Gender: F                 HR:           70 bpm. Exam Location:  Inpatient Procedure: 2D Echo, Cardiac Doppler and Color Doppler (Both Spectral and Color            Flow Doppler were utilized during procedure). Indications:    CHF-Acute Diastolic I50.31  History:        Patient has prior history of Echocardiogram examinations, most                 recent 12/12/2023. CHF, CKD, stage 2;  Risk Factors:Hypertension                 and Dyslipidemia.  Sonographer:    Thea Norlander RCS Referring Phys: [id number] PAULA SOUTHERLY IMPRESSIONS  1. Left ventricular ejection fraction, by estimation, is 70 to 75%. The left ventricle has hyperdynamic function. The left ventricle has no regional wall motion abnormalities. Left ventricular diastolic parameters are consistent with Grade I diastolic dysfunction (impaired relaxation).  2. Right ventricular systolic function is normal. The right ventricular size is normal. There is mildly elevated pulmonary artery systolic pressure. The estimated right ventricular  systolic pressure is 40.3 mmHg.  3. The mitral valve is grossly normal. No evidence of mitral valve regurgitation. No evidence of mitral stenosis.  4. The aortic valve is tricuspid. Aortic valve regurgitation is not visualized. No aortic stenosis is present.  5. The inferior vena cava is normal in size with <50% respiratory variability, suggesting right atrial pressure of 8 mmHg. FINDINGS  Left Ventricle: Left ventricular ejection fraction, by estimation, is 70 to 75%. The left ventricle has hyperdynamic function. The left ventricle has no regional wall motion abnormalities. The left ventricular internal cavity size was normal in size. There is borderline left ventricular hypertrophy. Left ventricular diastolic parameters are consistent with Grade I diastolic dysfunction (impaired relaxation). Right Ventricle: The right ventricular size is normal. No increase in right ventricular wall thickness. Right ventricular systolic function is normal. There is mildly elevated pulmonary artery systolic pressure. The tricuspid regurgitant velocity is 2.84  m/s, and with an assumed right atrial pressure of 8 mmHg, the estimated right ventricular systolic pressure is 40.3 mmHg. Left Atrium: Left atrial size was normal in size. Right Atrium: Right atrial size was normal in size. Prominent Eustachian valve. Pericardium:  There is no evidence of pericardial effusion. Mitral Valve: The mitral valve is grossly normal. No evidence of mitral valve regurgitation. No evidence of mitral valve stenosis. Tricuspid Valve: The tricuspid valve is grossly normal. Tricuspid valve regurgitation is mild . No evidence of tricuspid stenosis. Aortic Valve: The aortic valve is tricuspid. Aortic valve regurgitation is not visualized. No aortic stenosis is present. Aortic valve peak gradient measures 12.1 mmHg. Pulmonic Valve: The pulmonic valve was normal in structure. Pulmonic valve regurgitation is trivial. No evidence of pulmonic stenosis. Aorta: The aortic root is normal in size and structure. Venous: The inferior vena cava is normal in size with less than 50% respiratory variability, suggesting right atrial pressure of 8 mmHg. IAS/Shunts: No atrial level shunt detected by color flow Doppler.  LEFT VENTRICLE PLAX 2D LVIDd:         3.30 cm   Diastology LVIDs:         2.00 cm   LV e' medial:    5.98 cm/s LV PW:         1.10 cm   LV E/e' medial:  11.2 LV IVS:        0.90 cm   LV e' lateral:   11.00 cm/s LVOT diam:     1.80 cm   LV E/e' lateral: 6.1 LV SV:         48 LV SV Index:   35 LVOT Area:     2.54 cm  RIGHT VENTRICLE             IVC RV S prime:     25.00 cm/s  IVC diam: 1.90 cm TAPSE (M-mode): 2.0 cm LEFT ATRIUM             Index        RIGHT ATRIUM           Index LA diam:        3.40 cm 2.46 cm/m   RA Area:     11.70 cm LA Vol (A2C):   27.3 ml 19.72 ml/m  RA Volume:   23.10 ml  16.68 ml/m LA Vol (A4C):   27.7 ml 20.01 ml/m LA Biplane Vol: 27.7 ml 20.01 ml/m  AORTIC VALVE AV Area (Vmax): 1.64 cm AV Vmax:        174.00 cm/s AV Peak Grad:  12.1 mmHg LVOT Vmax:      112.00 cm/s LVOT Vmean:     69.800 cm/s LVOT VTI:       0.190 m  AORTA Ao Root diam: 2.60 cm Ao Asc diam:  3.00 cm MITRAL VALVE               TRICUSPID VALVE MV Area (PHT): 2.95 cm    TR Peak grad:   32.3 mmHg MV Decel Time: 257 msec    TR Vmax:        284.00 cm/s MV E  velocity: 66.80 cm/s MV A velocity: 92.10 cm/s  SHUNTS MV E/A ratio:  0.73        Systemic VTI:  0.19 m                            Systemic Diam: 1.80 cm Soyla Merck MD Electronically signed by Soyla Merck MD Signature Date/Time: 01/10/2024/5:54:22 PM    Final    DG CHEST PORT 1 VIEW Result Date: 01/10/2024 CLINICAL DATA:  Intubation and NG placement. EXAM: PORTABLE CHEST 1 VIEW COMPARISON:  Earlier chest radiograph dated 01/10/2024. FINDINGS: Interval advancement of the enteric tube with tip in the distal stomach. Endotracheal tube and right sided catheter in similar position. Bilateral pleural effusions with associated bibasilar atelectasis similar or slightly increased since the prior radiograph. Superimposed pneumonia is not excluded. No pneumothorax. Stable cardiac silhouette No bowel dilatation or evidence of obstruction. No free air. A vascular catheter noted over the right sacrum. Scoliosis and degenerative changes of spine. No acute osseous pathology. IMPRESSION: 1. Interval advancement of the enteric tube with tip in the distal stomach. 2. Bilateral pleural effusions with associated bibasilar atelectasis similar or slightly increased since the prior radiograph. Electronically Signed   By: Vanetta Chou M.D.   On: 01/10/2024 13:04   DG Abd 1 View Result Date: 01/10/2024 CLINICAL DATA:  Intubation and NG placement. EXAM: PORTABLE CHEST 1 VIEW COMPARISON:  Earlier chest radiograph dated 01/10/2024. FINDINGS: Interval advancement of the enteric tube with tip in the distal stomach. Endotracheal tube and right sided catheter in similar position. Bilateral pleural effusions with associated bibasilar atelectasis similar or slightly increased since the prior radiograph. Superimposed pneumonia is not excluded. No pneumothorax. Stable cardiac silhouette No bowel dilatation or evidence of obstruction. No free air. A vascular catheter noted over the right sacrum. Scoliosis and degenerative changes of  spine. No acute osseous pathology. IMPRESSION: 1. Interval advancement of the enteric tube with tip in the distal stomach. 2. Bilateral pleural effusions with associated bibasilar atelectasis similar or slightly increased since the prior radiograph. Electronically Signed   By: Vanetta Chou M.D.   On: 01/10/2024 13:04   DG Chest Port 1 View Result Date: 01/10/2024 EXAM: 1 VIEW(S) XRAY OF THE CHEST 01/10/2024 09:53:00 AM COMPARISON: Earlier today. CLINICAL HISTORY: tube position tube position FINDINGS: LINES, TUBES AND DEVICES: Interval repositioning of the enteric tube. The tip of the tube is just below the expected location of the GE junction and the side hole for the enteric tube is above the GE junction. Right sided pigtail thoracostomy tube with pigtail overlying the right base. ET tube tip is just above the carina. LUNGS AND PLEURA: Right lower lobe atelectasis. Unchanged small right pleural effusion. a unchanged small residual right apical pneumothorax . SABRA HEART AND MEDIASTINUM: No acute abnormality of the cardiac and mediastinal silhouettes. BONES AND SOFT TISSUES: Right upper quadrant cholecystectomy clips. Status post  ACDF in the lower cervical spine. No acute osseous abnormality. IMPRESSION: 1. Interval repositioning of the enteric tube with the tip just below the expected location of the GE junction and the side hole above the GE junction. Recommend further advancement of the enteric tube such that the side hole is within the stomach. 2. Unchanged small right apical pneumothorax with chest tube in place. Electronically signed by: Waddell Calk MD 01/10/2024 10:00 AM EST RP Workstation: [id number]   DG Chest Portable 1 View Result Date: 01/10/2024 EXAM: 1 VIEW(S) XRAY OF THE CHEST 01/10/2024 08:56:45 AM COMPARISON: 01/10/2024 CLINICAL HISTORY: intubated FINDINGS: LINES, TUBES AND DEVICES: Endotracheal tube in place with tip 3.1 cm above the carina. Enteric tube tip terminates over the left side  of the heart, cannot exclude endobronchial placement of the NG tube. Right chest tube with pigtail in right lower hemithorax. LUNGS AND PLEURA: Persistent streaky airspace opacities at right lung base. Persistent right pleural effusion. Trace left pleural effusion, stable. Trace right apical pneumothorax, stable. HEART AND MEDIASTINUM: Aortic arch atherosclerosis. No acute abnormality of the cardiac and mediastinal silhouettes. BONES AND SOFT TISSUES: ACDF hardware in lower cervical spine. Right upper quadrant surgical clips, consistent with cholecystectomy. No acute osseous abnormality. IMPRESSION: 1. Enteric tube tip projects over the . left lower lobe, which may reflect endobronchial placement. 2. Endotracheal tube and right chest tube in appropriate positions. 3. Trace right apical pneumothorax, stable. 4. Right pleural effusion, persistent. Electronically signed by: Waddell Calk MD 01/10/2024 09:12 AM EST RP Workstation: [id number]   DG Chest Port 1 View Result Date: 01/10/2024 EXAM: 1 VIEW(S) XRAY OF THE CHEST 01/10/2024 07:33:16 AM COMPARISON: 01/10/2024 CLINICAL HISTORY: cp FINDINGS: LINES, TUBES AND DEVICES: Right chest tube with pigtail overlying right lower lung. Right upper quadrant surgical clips. Cervical spine surgical hardware. LUNGS AND PLEURA: Bibasilar atelectasis/opacities, increased in the interval. Small right pleural effusion. Trace left pleural effusion. Small right pneumothorax. HEART AND MEDIASTINUM: Aortic calcification. BONES AND SOFT TISSUES: No acute osseous abnormality. IMPRESSION: 1. Small right pneumothorax, unchanged. 2. Small right pleural effusion. 3. Trace left pleural effusion. 4. Bibasilar atelectasis/opacities, increased in the interval. Electronically signed by: Waddell Calk MD 01/10/2024 07:36 AM EST RP Workstation: [id number]   CT Angio Chest Pulmonary Embolism (PE) W or WO Contrast Result Date: 01/10/2024 EXAM: CTA of the Chest with contrast for PE 01/10/2024  05:44:00 AM TECHNIQUE: CTA of the chest was performed without and with the administration of 75 mL of intravenous iohexol  (OMNIPAQUE ) 350 MG/ML injection. Multiplanar reformatted images are provided for review. MIP images are provided for review. Automated exposure control, iterative reconstruction, and/or weight based adjustment of the mA/kV was utilized to reduce the radiation dose to as low as reasonably achievable. COMPARISON: 12/29/2022 CLINICAL HISTORY: Pulmonary embolism (PE) suspected, high prob. FINDINGS: PULMONARY ARTERIES: Pulmonary arteries are adequately opacified for evaluation. No pulmonary embolism. Main pulmonary artery is normal in caliber. MEDIASTINUM: Cardiac enlargement. Aortic atherosclerosis. Aortic atherosclerosis. LYMPH NODES: No mediastinal, hilar or axillary lymphadenopathy. LUNGS AND PLEURA: New airspace consolidation, ground-glass attenuation, and atelectasis are involving the posterior and lateral right upper lobe, right middle lobe, and right lower lobe. Subsegmental atelectasis is noted within the lingula and left lower lobe. Diffuse bronchial wall thickening. Emphysema. The small to moderate volume right pleural effusion is increased in volume from the previous exam. A small left pleural effusion is noted, which is decreased in volume from the previous exam. There is a small to moderate anterior and medial right pneumothorax, which is new from  the previous exam. UPPER ABDOMEN: Moderate-sized hiatal hernia. Status post cholecystectomy. Unchanged dilatation of the common bile duct, which in the absence of any clinical signs or symptoms of biliary obstruction, likely reflects post-cholecystectomy physiology. 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 SOFT TISSUES AND BONES: Anterior plate and screw fixation hardware are noted within the lower cervical spine. IMPRESSION: 1. No pulmonary embolism.  2. Small to moderate right pneumothorax, new. 3. Multilobar right lung consolidation and atelectasis. Imaging findings may reflect pneumonia and/or aspiration. 4. Increased small to moderate right pleural effusion. 5. Decreased small left pleural effusion. 6. Aortic atherosclerosis and emphysema. Electronically signed by: Waddell Calk MD 01/10/2024 06:18 AM EST RP Workstation: [id number]   CT HEAD WO CONTRAST ( ) Result Date: 01/10/2024 EXAM: CT HEAD WITHOUT CONTRAST 01/10/2024 05:44:00 AM TECHNIQUE: CT of the head was performed without the administration of intravenous contrast. Automated exposure control, iterative reconstruction, and/or weight based adjustment of the mA/kV was utilized to reduce the radiation dose to as low as reasonably achievable. COMPARISON: 09/20/2023. CLINICAL HISTORY: Delirium. FINDINGS: BRAIN AND VENTRICLES: No acute hemorrhage. No evidence of acute infarct. No hydrocephalus. No extra-axial collection. No mass effect or midline shift. Similar appearance of encephalomalacia involving the left posterolateral cerebellum with overlying craniectomy changes. Hyperdense embolic material is identified in the left cerebellopontine angle. ORBITS: No acute abnormality. SINUSES: Mild mucosal thickening and periosteal thickening are involving the right maxillary sinus as before. No sinus air fluid levels. SOFT TISSUES AND SKULL: No acute soft tissue abnormality. No skull fracture. IMPRESSION: 1. No acute intracranial abnormality. Electronically signed by: Waddell Calk MD 01/10/2024 06:05 AM EST RP Workstation: [id number]   DG Chest Port 1 View Result Date: 01/10/2024 EXAM: 1 VIEW(S) XRAY OF THE CHEST 01/10/2024 05:02:53 AM COMPARISON: CT Chest 01/03/2024. CLINICAL HISTORY: 64 year old female with chest pain. FINDINGS: LUNGS AND PLEURA: Lower lung volumes. Streaky and confluent perihilar and bilateral lung base opacities. Bibasilar hazy airspace opacities, possibly atelectasis. Trace fluid  in the right minor fissure now. Bilateral lung bases layering pleural effusions on CT chest earlier this month. No pulmonary edema. No pneumothorax. HEART AND MEDIASTINUM: Aortic calcification. No acute abnormality of the cardiac and mediastinal silhouettes. BONES AND SOFT TISSUES: Right upper quadrant surgical clips noted. Cervical fixation hardware noted. Increased gaseous distention of bowel in the visible abdomen. No acute osseous abnormality. IMPRESSION: 1. Increased streaky and confluent perihilar and bilateral lung bases opacity. Known recent pleural effusions, but appearance now is suspicious for aspiration or pneumonia. Electronically signed by: Helayne Hurst MD 01/10/2024 05:18 AM EST RP Workstation: [id number]   CT ABDOMEN PELVIS W CONTRAST Result Date: 01/03/2024 EXAM: CT ABDOMEN AND PELVIS WITH CONTRAST 01/03/2024 03:27:38 PM TECHNIQUE: CT of the abdomen and pelvis was performed with the administration of 75 mL of iohexol  (OMNIPAQUE ) 300 MG/ML solution. Multiplanar reformatted images are provided for review. Automated exposure control, iterative reconstruction, and/or weight-based adjustment of the mA/kV was utilized to reduce the radiation dose to as low as reasonably achievable. COMPARISON: 12/29/2023 CLINICAL HISTORY: Abdominal pain, acute, nonlocalized. Previous Nissen fundoplication. FINDINGS: LOWER CHEST: Interval resolution of pleural effusions. Small pericardial effusion, slightly decreased. LIVER: The liver is unremarkable. GALLBLADDER AND BILE DUCTS: Cholecystectomy clips. No biliary ductal dilatation. SPLEEN: No acute abnormality. PANCREAS: No acute abnormality. ADRENAL GLANDS: No acute abnormality. KIDNEYS, URETERS AND BLADDER: No stones in the kidneys or ureters. No hydronephrosis. No perinephric or periureteral stranding. Urinary bladder is unremarkable. 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 There is no bowel obstruction. PERITONEUM AND  RETROPERITONEUM: No ascites. No free air. VASCULATURE: Aorta is normal in caliber. Mild scattered iliac calcified plaque without aneurysm. LYMPH NODES: No lymphadenopathy. REPRODUCTIVE ORGANS: Previous hysterectomy. BONES AND SOFT TISSUES: Lumbar levoscoliosis with multilevel spondylotic change. Left pelvic phleboliths. No acute osseous abnormality. No focal soft tissue abnormality. IMPRESSION: 1. 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 2. Interval resolution of pleural effusions. Small pericardial effusion, slightly decreased. Electronically signed by: Dayne Hassell MD 01/03/2024 03:39 PM EST RP Workstation: [id number]   DG Chest Port 1 View Result Date: 01/03/2024 CLINICAL DATA:  Shortness of breath and chest pain. EXAM: PORTABLE CHEST 1 VIEW COMPARISON:  12/28/2023 FINDINGS: Lungs are adequately inflated without effusion, focal airspace consolidation or pneumothorax. Moderate size hiatal hernia unchanged. Cardiomediastinal silhouette and remainder of the exam is unchanged. IMPRESSION: 1. No acute cardiopulmonary disease. 2. Moderate size hiatal hernia. Electronically Signed   By: Toribio Agreste M.D.   On: 01/03/2024 13:59   CT CHEST ABDOMEN PELVIS W CONTRAST Result Date: 12/29/2023 CLINICAL DATA:  Unintended weight loss, paraesophageal hernia, persistent chest and abdominal pain * Tracking Code: BO * EXAM: CT CHEST, ABDOMEN, AND PELVIS WITH CONTRAST TECHNIQUE: Multidetector CT imaging of the chest, abdomen and pelvis was performed following the standard protocol during bolus administration of intravenous contrast. RADIATION DOSE REDUCTION: This exam was performed according to the departmental dose-optimization program which includes automated exposure control, adjustment of the mA and/or kV according to patient size and/or use of iterative reconstruction technique. CONTRAST:  OMNIPAQUE  IOHEXOL  300 MG/ML  SOLN COMPARISON:  CT abdomen pelvis, 12/23/2023, CT chest  angiogram, 11/25/2023 FINDINGS: CT CHEST FINDINGS Cardiovascular: Aortic atherosclerosis. Normal heart size. Unchanged small pericardial effusion. Mediastinum/Nodes: No enlarged mediastinal, hilar, or axillary lymph nodes. Large hiatal hernia with intrathoracic position of the gastric fundus; appearance suggests prior fundoplication. Thyroid gland, trachea, and esophagus demonstrate no significant findings. Lungs/Pleura: Moderate left, small right pleural effusions and associated atelectasis or consolidation. Mild centrilobular emphysema. Musculoskeletal: No chest wall abnormality. No acute osseous findings. CT ABDOMEN PELVIS FINDINGS Hepatobiliary: No focal liver abnormality is seen. Status post cholecystectomy. Postoperative biliary ductal dilatation. Pancreas: Unremarkable. No pancreatic ductal dilatation or surrounding inflammatory changes. Spleen: Normal in size without significant abnormality. Adrenals/Urinary Tract: Adrenal glands are unremarkable. Kidneys are normal, without renal calculi, solid lesion, or hydronephrosis. Bladder is unremarkable. Stomach/Bowel: Stomach is within normal limits. Appendix not clearly visualized. Long segment colonic wall thickening and mucosal hyperenhancement involving multiple segments of colon from the cecum to the rectum, with multiple interposed segments of sparing. Vascular/Lymphatic: Aortic atherosclerosis. No enlarged abdominal or pelvic lymph nodes. Reproductive: Hysterectomy. Other: No abdominal wall hernia or abnormality. No ascites. Musculoskeletal: No acute osseous findings. IMPRESSION: 1. Long segment colonic wall thickening and mucosal hyperenhancement involving multiple segments of colon from the cecum to the rectum, with multiple interposed segments of sparing. Findings are consistent with nonspecific infectious, inflammatory, or ischemic colitis, appearance of skip lesions suggesting Crohn's colitis. 2. Moderate left, small right pleural effusions and  associated atelectasis or consolidation. 3. Large hiatal hernia with intrathoracic position of the gastric fundus; appearance suggests prior fundoplication. 4. Emphysema. 5. Cholecystectomy. Aortic Atherosclerosis (ICD10-I70.0) and Emphysema (ICD10-J43.9). Electronically Signed   By: Marolyn JONETTA Jaksch M.D.   On: 12/29/2023 16:13   DG Abd 2 Views Result Date: 12/28/2023 CLINICAL DATA:  [id number] Pain [id number] EXAM: ABDOMEN - 2 VIEW COMPARISON:  Correct over sixteenth 2025, December 01, 2023. FINDINGS: Air and  stool-filled nondilated loops of bowel. Mildly edematous appearance of the walls of loops of small bowel. Mild colonic stool burden diffusely throughout the colon. Visualized lung bases are unremarkable. Favored hiatal hernia. Degenerative changes of the lumbar spine. Status post cholecystectomy. Levoscoliosis of the lumbar spine IMPRESSION: 1. Nonobstructive bowel gas pattern. 2. Mildly edematous appearance of the walls of loops of small bowel. This is nonspecific but could reflect enteritis. Electronically Signed   By: Corean Salter M.D.   On: 12/28/2023 10:46   DG Chest 2 View Result Date: 12/28/2023 CLINICAL DATA:  Chest pain EXAM: DG CHEST 2V COMPARISON:  December 04, 2023 FINDINGS: The cardiomediastinal silhouette is unchanged in contour.Rounded density over the midline likely reflecting a large hiatal hernia. Atherosclerotic calcifications of the aorta. No pleural effusion. No pneumothorax. No acute pleuroparenchymal abnormality. Lucency projecting anterior to the the xiphoid process on lateral radiograph could reflect summation artifact versus a ventral hernia. Multilevel degenerative changes of the thoracic spine. IMPRESSION: 1. No acute cardiopulmonary abnormality. Favored underlying large hiatal hernia 2. Lucency projecting anterior to the xiphoid process on lateral radiograph could reflect summation artifact versus a ventral hernia. Recommend correlation with physical exam. Electronically Signed    By: Corean Salter M.D.   On: 12/28/2023 10:43   CT ABDOMEN PELVIS W CONTRAST Result Date: 12/23/2023 EXAM: CT ABDOMEN AND PELVIS WITH CONTRAST 12/23/2023 02:41:11 PM TECHNIQUE: CT of the abdomen and pelvis was performed with the administration of 100 mL of iohexol  (OMNIPAQUE ) 300 MG/ML solution. Multiplanar reformatted images are provided for review. Automated exposure control, iterative reconstruction, and/or weight-based adjustment of the mA/kV was utilized to reduce the radiation dose to as low as reasonably achievable. COMPARISON: CT Abd-pelv W/ 11/25/2023 and 12/04/2023. CLINICAL HISTORY: Upper abdominal pain with intractable nausea and vomiting. FINDINGS: LOWER CHEST: Small left pleural effusion. Left lower lobe subsegmental atelectasis. Small pericardial effusion is similar to 12/04/2023. There is adjacent free fluid in the posterior mediastinum. LIVER: The liver is unremarkable. GALLBLADDER AND BILE DUCTS: Cholecystectomy. No biliary ductal dilatation. SPLEEN: No acute abnormality. PANCREAS: No acute abnormality. ADRENAL GLANDS: Stable adrenal glands. KIDNEYS, URETERS AND BLADDER: No stones in the kidneys or ureters. No hydronephrosis. No perinephric or periureteral stranding. Urinary bladder is unremarkable. GI AND BOWEL: Postoperative change of Nissen fundoplication. Moderate hiatal hernia containing the fundoplication. Wall thickening about the distal esophagus is partially visualized and may in part be postoperative however esophagitis is not excluded. Stomach demonstrates no acute abnormality. There is no bowel obstruction. Normal appendix. PERITONEUM AND RETROPERITONEUM: No ascites. No free air. VASCULATURE: Aorta is normal in caliber. Aortic atherosclerotic calcification. LYMPH NODES: No lymphadenopathy. REPRODUCTIVE ORGANS: No acute abnormality. BONES AND SOFT TISSUES: No acute osseous abnormality. No focal soft tissue abnormality. IMPRESSION: 1. Postoperative change of Nissen fundoplication  with moderate hiatal hernia containing the fundoplication; distal esophageal wall thickening may be postoperative, however esophagitis is not excluded. Adjacent free fluid in the posterior mediastinum. Consider CT of the chest with IV contrast and 100 ml oral contrast administered a few minutes prior to the exam to better evaluate the hernia and fundoplication. 2. Small left pleural effusion with left lower lobe subsegmental atelectasis, decreased from 10 / 16 / 25. 3. Small pericardial effusion, similar to 12/04/23. Electronically signed by: Norman Gatlin MD 12/23/2023 07:35 PM EST RP Workstation: [id number]    Microbiology Recent Results (from the past 240 hours)  Culture, blood (Routine X 2) w Reflex to ID Panel     Status: Abnormal   Collection Time: 01/15/24  8:46 PM   Specimen: BLOOD RIGHT FOREARM  Result Value Ref Range Status   Specimen Description   Final    BLOOD RIGHT FOREARM Performed at [facility], [address] [place], [place] [address]    Special Requests   Final    BOTTLES DRAWN AEROBIC ONLY Blood Culture results may not be optimal due to an inadequate volume of blood received in culture bottles Performed at [facility], [address] [place], [place] [address]    Culture  Setup Time   Final    GRAM POSITIVE COCCI AEROBIC BOTTLE ONLY CRITICAL RESULT CALLED TO, READ BACK BY AND VERIFIED WITH: PHARMD E JACKSON 01/17/2024 @ 0429 BY AB Performed at [facility], [address] [place], [place] [address]    Culture (A)  Final    ENTEROCOCCUS FAECIUM VANCOMYCIN RESISTANT ENTEROCOCCUS ISOLATED    Report Status 01/19/2024 FINAL  Final   Organism ID, Bacteria ENTEROCOCCUS FAECIUM  Final      Susceptibility   Enterococcus faecium - MIC*    AMPICILLIN >=32 RESISTANT Resistant     VANCOMYCIN >=32 RESISTANT Resistant     GENTAMICIN SYNERGY SENSITIVE Sensitive     LINEZOLID 1 SENSITIVE Sensitive     * ENTEROCOCCUS FAECIUM  Culture, blood  (Routine X 2) w Reflex to ID Panel     Status: Abnormal   Collection Time: 01/15/24  8:46 PM   Specimen: BLOOD RIGHT HAND  Result Value Ref Range Status   Specimen Description   Final    BLOOD RIGHT HAND Performed at [facility], [address], [place], [place] [address]    Special Requests   Final    BOTTLES DRAWN AEROBIC ONLY Blood Culture results may not be optimal due to an inadequate volume of blood received in culture bottles Performed at [facility], [address] [place], [place] [address]    Culture  Setup Time   Final    GRAM POSITIVE COCCI IN PAIRS IN CHAINS AEROBIC BOTTLE ONLY CRITICAL VALUE NOTED.  VALUE IS CONSISTENT WITH PREVIOUSLY REPORTED AND CALLED VALUE.    Culture (A)  Final    ENTEROCOCCUS FAECIUM SUSCEPTIBILITIES PERFORMED ON PREVIOUS CULTURE WITHIN THE LAST 5 DAYS. Performed at [facility], [address] [place], [place] [address]    Report Status 01/20/2024 FINAL  Final  Blood Culture ID Panel (Reflexed)     Status: Abnormal   Collection Time: 01/15/24  8:46 PM  Result Value Ref Range Status   Enterococcus faecalis NOT DETECTED NOT DETECTED Final   Enterococcus Faecium DETECTED (A) NOT DETECTED Final    Comment: CRITICAL RESULT CALLED TO, READ BACK BY AND VERIFIED WITH: PHARMD E JACKSON 01/17/2024 @ 0429 BY AB    Listeria monocytogenes NOT DETECTED NOT DETECTED Final   Staphylococcus species NOT DETECTED NOT DETECTED Final   Staphylococcus aureus (BCID) NOT DETECTED NOT DETECTED Final   Staphylococcus epidermidis NOT DETECTED NOT DETECTED Final   Staphylococcus lugdunensis NOT DETECTED NOT DETECTED Final   Streptococcus species NOT DETECTED NOT DETECTED Final   Streptococcus agalactiae NOT DETECTED NOT DETECTED Final   Streptococcus pneumoniae NOT DETECTED NOT DETECTED Final   Streptococcus pyogenes NOT DETECTED NOT DETECTED Final   A.calcoaceticus-baumannii NOT DETECTED NOT DETECTED Final   Bacteroides fragilis  NOT DETECTED NOT DETECTED Final   Enterobacterales NOT DETECTED NOT DETECTED Final   Enterobacter cloacae complex NOT DETECTED NOT DETECTED Final   Escherichia coli NOT  DETECTED NOT DETECTED Final   Klebsiella aerogenes NOT DETECTED NOT DETECTED Final   Klebsiella oxytoca NOT DETECTED NOT DETECTED Final   Klebsiella pneumoniae NOT DETECTED NOT DETECTED Final   Proteus species NOT DETECTED NOT DETECTED Final   Salmonella species NOT DETECTED NOT DETECTED Final   Serratia marcescens NOT DETECTED NOT DETECTED Final   Haemophilus influenzae NOT DETECTED NOT DETECTED Final   Neisseria meningitidis NOT DETECTED NOT DETECTED Final   Pseudomonas aeruginosa NOT DETECTED NOT DETECTED Final   Stenotrophomonas maltophilia NOT DETECTED NOT DETECTED Final   Candida albicans NOT DETECTED NOT DETECTED Final   Candida auris NOT DETECTED NOT DETECTED Final   Candida glabrata NOT DETECTED NOT DETECTED Final   Candida krusei NOT DETECTED NOT DETECTED Final   Candida parapsilosis NOT DETECTED NOT DETECTED Final   Candida tropicalis NOT DETECTED NOT DETECTED Final   Cryptococcus neoformans/gattii NOT DETECTED NOT DETECTED Final   Vancomycin resistance DETECTED (A) NOT DETECTED Final    Comment: CRITICAL RESULT CALLED TO, READ BACK BY AND VERIFIED WITH: PHARMD E JACKSON 01/17/2024 @ 0429 BY AB Performed at [facility], [address], [place], [place] [address]   Culture, blood (single) w Reflex to ID Panel     Status: Abnormal   Collection Time: 01/17/24  1:21 PM   Specimen: BLOOD RIGHT HAND  Result Value Ref Range Status   Specimen Description   Final    BLOOD RIGHT HAND Performed at [facility], [address] [place], [place] [address]    Special Requests   Final    BOTTLES DRAWN AEROBIC ONLY Blood Culture results may not be optimal due to an inadequate volume of blood received in culture bottles Performed at [facility], [address], [place], [place]  [address]    Culture  Setup Time   Final    GRAM POSITIVE COCCI IN CLUSTERS AEROBIC BOTTLE ONLY CRITICAL RESULT CALLED TO, READ BACK BY AND VERIFIED WITH: PHARMD E JACKSON 01/19/2024 @ 0525 BY AB    Culture (A)  Final    STAPHYLOCOCCUS EPIDERMIDIS THE SIGNIFICANCE OF ISOLATING THIS ORGANISM FROM A SINGLE SET OF BLOOD CULTURES WHEN MULTIPLE SETS ARE DRAWN IS UNCERTAIN. PLEASE NOTIFY THE MICROBIOLOGY DEPARTMENT WITHIN ONE WEEK IF SPECIATION AND SENSITIVITIES ARE REQUIRED. Performed at [facility], [address], [place], [place] [address]    Report Status 01/20/2024 FINAL  Final  Blood Culture ID Panel (Reflexed)     Status: Abnormal   Collection Time: 01/17/24  1:21 PM  Result Value Ref Range Status   Enterococcus faecalis NOT DETECTED NOT DETECTED Final   Enterococcus Faecium NOT DETECTED NOT DETECTED Final   Listeria monocytogenes NOT DETECTED NOT DETECTED Final   Staphylococcus species DETECTED (A) NOT DETECTED Final    Comment: CRITICAL RESULT CALLED TO, READ BACK BY AND VERIFIED WITH: PHARMD E JACKSON 01/19/2024 @ 0525 BY AB    Staphylococcus aureus (BCID) NOT DETECTED NOT DETECTED Final   Staphylococcus epidermidis DETECTED (A) NOT DETECTED Final    Comment: Methicillin (oxacillin) resistant coagulase negative staphylococcus. Possible blood culture contaminant (unless isolated from more than one blood culture draw or clinical case suggests pathogenicity). No antibiotic treatment is indicated for blood  culture contaminants. CRITICAL RESULT CALLED TO, READ BACK BY AND VERIFIED WITH: PHARMD E JACKSON 01/19/2024 @ 0525 BY AB    Staphylococcus lugdunensis NOT DETECTED NOT DETECTED Final   Streptococcus species NOT DETECTED NOT DETECTED Final   Streptococcus agalactiae  NOT DETECTED NOT DETECTED Final   Streptococcus pneumoniae NOT DETECTED NOT DETECTED Final   Streptococcus pyogenes NOT DETECTED NOT DETECTED Final   A.calcoaceticus-baumannii NOT DETECTED NOT DETECTED Final    Bacteroides fragilis NOT DETECTED NOT DETECTED Final   Enterobacterales NOT DETECTED NOT DETECTED Final   Enterobacter cloacae complex NOT DETECTED NOT DETECTED Final   Escherichia coli NOT DETECTED NOT DETECTED Final   Klebsiella aerogenes NOT DETECTED NOT DETECTED Final   Klebsiella oxytoca NOT DETECTED NOT DETECTED Final   Klebsiella pneumoniae NOT DETECTED NOT DETECTED Final   Proteus species NOT DETECTED NOT DETECTED Final   Salmonella species NOT DETECTED NOT DETECTED Final   Serratia marcescens NOT DETECTED NOT DETECTED Final   Haemophilus influenzae NOT DETECTED NOT DETECTED Final   Neisseria meningitidis NOT DETECTED NOT DETECTED Final   Pseudomonas aeruginosa NOT DETECTED NOT DETECTED Final   Stenotrophomonas maltophilia NOT DETECTED NOT DETECTED Final   Candida albicans NOT DETECTED NOT DETECTED Final   Candida auris NOT DETECTED NOT DETECTED Final   Candida glabrata NOT DETECTED NOT DETECTED Final   Candida krusei NOT DETECTED NOT DETECTED Final   Candida parapsilosis NOT DETECTED NOT DETECTED Final   Candida tropicalis NOT DETECTED NOT DETECTED Final   Cryptococcus neoformans/gattii NOT DETECTED NOT DETECTED Final   Methicillin resistance mecA/C DETECTED (A) NOT DETECTED Final    Comment: CRITICAL RESULT CALLED TO, READ BACK BY AND VERIFIED WITH: PHARMD E JACKSON 01/19/2024 @ 0525 BY AB Performed at [facility], [address], [place], [place] [address]     Lab Basic Metabolic Panel: Recent Labs  Lab 01/15/24 0446 01/15/24 2250 01/16/24 0412 01/17/24 0437 01/18/24 0725 01/19/24 0526 01/20/24 0442  NA 140 142 142 147* 148* 141 144  K 4.9 4.3 4.3 4.1 2.8* 3.5 3.8  CL 109 109 109 112* 114* 110 111  CO2 23 23 23 24 26 25 26   GLUCOSE 209* 119* 148* 117* 125* 104* 112*  BUN 46* 68* 66* 63* 41* 24* 14  CREATININE 1.21* 1.45* 1.28* 0.89 0.58 0.52 0.47  CALCIUM  9.3 9.6 9.5 9.9 9.5 8.2* 8.8*  MG 2.6* 2.6* 2.5* 2.5* 2.0 1.6*  --   PHOS 4.7*  --  3.9 4.2 1.4*  2.6  --    Liver Function Tests: Recent Labs  Lab 01/18/24 0725  AST 82*  ALT 204*  ALKPHOS 136*  BILITOT 0.3  PROT 4.8*  ALBUMIN  2.2*   Recent Labs  Lab 01/18/24 0725  LIPASE 46   No results for input(s): AMMONIA in the last 168 hours. CBC: Recent Labs  Lab 01/15/24 0446 01/16/24 0412 01/18/24 0839 01/19/24 0545 01/19/24 2055 01/20/24 0442  WBC 36.9* 21.3* 28.9* 18.4*  --  21.2*  NEUTROABS 34.7*  --   --  13.9*  --  19.3*  HGB 10.8* 8.8* 8.9* 6.6* 9.5* 9.7*  HCT 36.1 28.9* 27.5* 21.3* 29.3* 30.5*  MCV 88.0 87.0 79.9* 81.9  --  85.2  PLT 226 118* 123* 74*  --  88*   Cardiac Enzymes: Recent Labs  Lab 01/18/24 0725  CKTOTAL 78   Sepsis Labs: Recent Labs  Lab 01/16/24 0412 01/18/24 0839 01/19/24 0545 01/20/24 0442  WBC 21.3* 28.9* 18.4* 21.2*    Keyli Duross , 1:37 PM

## 2024-02-19 NOTE — TOC Progression Note (Signed)
 Transition of Care [facility]) - Progression Note    Patient Details  Name: Renee Gutierrez MRN: [id number] Date of Birth: 1960-02-28  [facility]) CM/SW Contact  Jon ONEIDA Anon, RN Phone Number: , 10:28 AM  Clinical Narrative:    Plan to extubate and remove mechanical ventilation today. Pt now transitioning to comfort care measures. No further IP CM needs at this time. Will sign off.   Expected Discharge Plan:  (TBD) Barriers to Discharge: Continued Medical Work up               Expected Discharge Plan and Services In-house Referral: NA Discharge Planning Services: CM Consult Post Acute Care Choice: Durable Medical Equipment Living arrangements for the past 2 months: Single Family Home                 DME Arranged: N/A DME Agency: NA       HH Arranged: NA HH Agency: NA         Social Drivers of Health (SDOH) Interventions SDOH Screenings   Food Insecurity: No Food Insecurity (01/04/2024)  Recent Concern: Food Insecurity - Food Insecurity Present (12/28/2023)  Housing: Unknown (01/11/2024)  Transportation Needs: Patient Unable To Answer (01/11/2024)  Utilities: Patient Unable To Answer (01/11/2024)  Tobacco Use: High Risk (01/11/2024)    Readmission Risk Interventions    01/12/2024   11:08 AM 01/04/2024    1:15 PM 12/29/2023    2:24 PM  Readmission Risk Prevention Plan  Transportation Screening Complete Complete Complete  Medication Review Oceanographer) Complete Complete Complete  PCP or Specialist appointment within 3-5 days of discharge Complete Not Complete   HRI or Home Care Consult Complete Complete Complete  SW Recovery Care/Counseling Consult Complete Complete Complete  Palliative Care Screening Not Applicable Not Applicable Not Applicable  Skilled Nursing Facility Not Applicable Not Applicable Not Applicable

## 2024-02-19 NOTE — Progress Notes (Signed)
 Nutrition Brief Note  Chart reviewed. Pt now transitioning to comfort care.  No further nutrition interventions planned at this time.  Please re-consult as needed.   Shelle Iron RD, LDN Contact via [employer].

## 2024-02-19 NOTE — Progress Notes (Signed)
 Patient terminally extubated to RA with comfort care measures. Patient resting comfortable. RN and family at bedside.

## 2024-02-19 DEATH — deceased
